# Patient Record
Sex: Male | Born: 1955 | Race: Black or African American | Hispanic: No | Marital: Single | Smoking: Former smoker
Health system: Southern US, Community
[De-identification: ages and names within clinical notes are randomized; demographics above are authoritative.]

## PROBLEM LIST (undated history)

## (undated) DIAGNOSIS — Z972 Presence of dental prosthetic device (complete) (partial): Secondary | ICD-10-CM

## (undated) DIAGNOSIS — R569 Unspecified convulsions: Secondary | ICD-10-CM

## (undated) DIAGNOSIS — Z862 Personal history of diseases of the blood and blood-forming organs and certain disorders involving the immune mechanism: Secondary | ICD-10-CM

## (undated) DIAGNOSIS — I10 Essential (primary) hypertension: Secondary | ICD-10-CM

## (undated) DIAGNOSIS — Z87898 Personal history of other specified conditions: Secondary | ICD-10-CM

## (undated) DIAGNOSIS — C159 Malignant neoplasm of esophagus, unspecified: Secondary | ICD-10-CM

## (undated) DIAGNOSIS — L404 Guttate psoriasis: Secondary | ICD-10-CM

## (undated) DIAGNOSIS — E785 Hyperlipidemia, unspecified: Secondary | ICD-10-CM

## (undated) DIAGNOSIS — F419 Anxiety disorder, unspecified: Secondary | ICD-10-CM

## (undated) DIAGNOSIS — K08109 Complete loss of teeth, unspecified cause, unspecified class: Secondary | ICD-10-CM

## (undated) DIAGNOSIS — F329 Major depressive disorder, single episode, unspecified: Secondary | ICD-10-CM

## (undated) DIAGNOSIS — R683 Clubbing of fingers: Secondary | ICD-10-CM

## (undated) DIAGNOSIS — F32A Depression, unspecified: Secondary | ICD-10-CM

## (undated) DIAGNOSIS — M1990 Unspecified osteoarthritis, unspecified site: Secondary | ICD-10-CM

## (undated) DIAGNOSIS — F1011 Alcohol abuse, in remission: Secondary | ICD-10-CM

## (undated) DIAGNOSIS — Z8679 Personal history of other diseases of the circulatory system: Secondary | ICD-10-CM

## (undated) DIAGNOSIS — K7030 Alcoholic cirrhosis of liver without ascites: Secondary | ICD-10-CM

## (undated) DIAGNOSIS — C61 Malignant neoplasm of prostate: Secondary | ICD-10-CM

## (undated) HISTORY — DX: Essential (primary) hypertension: I10

## (undated) HISTORY — PX: TRANSURETHRAL RESECTION OF PROSTATE: SHX73

## (undated) HISTORY — DX: Malignant neoplasm of esophagus, unspecified: C15.9

## (undated) HISTORY — DX: Anxiety disorder, unspecified: F41.9

## (undated) HISTORY — PX: NO PAST SURGERIES: SHX2092

## (undated) HISTORY — DX: Depression, unspecified: F32.A

## (undated) HISTORY — DX: Major depressive disorder, single episode, unspecified: F32.9

## (undated) SURGERY — COLONOSCOPY
Anesthesia: Moderate Sedation

## (undated) SURGERY — EGD (ESOPHAGOGASTRODUODENOSCOPY)
Anesthesia: Moderate Sedation

---

## 2002-02-08 ENCOUNTER — Encounter: Payer: Self-pay | Admitting: Family Medicine

## 2002-02-08 ENCOUNTER — Ambulatory Visit (HOSPITAL_COMMUNITY): Admission: RE | Admit: 2002-02-08 | Discharge: 2002-02-08 | Payer: Self-pay | Admitting: Family Medicine

## 2002-12-13 ENCOUNTER — Encounter: Payer: Self-pay | Admitting: Rheumatology

## 2002-12-13 ENCOUNTER — Encounter (HOSPITAL_COMMUNITY): Admission: RE | Admit: 2002-12-13 | Discharge: 2003-01-12 | Payer: Self-pay | Admitting: Rheumatology

## 2003-08-06 ENCOUNTER — Ambulatory Visit (HOSPITAL_COMMUNITY): Admission: RE | Admit: 2003-08-06 | Discharge: 2003-08-06 | Payer: Self-pay | Admitting: Family Medicine

## 2004-06-18 ENCOUNTER — Ambulatory Visit: Payer: Self-pay | Admitting: Family Medicine

## 2004-08-17 ENCOUNTER — Ambulatory Visit: Payer: Self-pay | Admitting: Family Medicine

## 2004-12-03 ENCOUNTER — Encounter: Payer: Self-pay | Admitting: Family Medicine

## 2004-12-17 ENCOUNTER — Ambulatory Visit: Payer: Self-pay | Admitting: Family Medicine

## 2005-01-26 ENCOUNTER — Ambulatory Visit: Payer: Self-pay | Admitting: Family Medicine

## 2005-04-07 ENCOUNTER — Ambulatory Visit: Payer: Self-pay | Admitting: Family Medicine

## 2006-06-13 ENCOUNTER — Ambulatory Visit: Payer: Self-pay | Admitting: Family Medicine

## 2006-06-13 ENCOUNTER — Ambulatory Visit (HOSPITAL_COMMUNITY): Admission: RE | Admit: 2006-06-13 | Discharge: 2006-06-13 | Payer: Self-pay | Admitting: Family Medicine

## 2006-07-14 ENCOUNTER — Ambulatory Visit: Payer: Self-pay | Admitting: Family Medicine

## 2007-06-19 ENCOUNTER — Ambulatory Visit: Payer: Self-pay | Admitting: Family Medicine

## 2007-07-04 ENCOUNTER — Ambulatory Visit (HOSPITAL_COMMUNITY): Admission: RE | Admit: 2007-07-04 | Discharge: 2007-07-04 | Payer: Self-pay | Admitting: Orthopaedic Surgery

## 2007-12-22 ENCOUNTER — Ambulatory Visit: Payer: Self-pay | Admitting: Family Medicine

## 2007-12-22 DIAGNOSIS — F411 Generalized anxiety disorder: Secondary | ICD-10-CM | POA: Insufficient documentation

## 2007-12-22 DIAGNOSIS — I10 Essential (primary) hypertension: Secondary | ICD-10-CM

## 2007-12-22 DIAGNOSIS — F17200 Nicotine dependence, unspecified, uncomplicated: Secondary | ICD-10-CM

## 2007-12-22 DIAGNOSIS — F329 Major depressive disorder, single episode, unspecified: Secondary | ICD-10-CM

## 2007-12-22 LAB — CONVERTED CEMR LAB
BUN: 10 mg/dL (ref 6–23)
Chloride: 106 meq/L (ref 96–112)
Creatinine, Ser: 0.91 mg/dL (ref 0.40–1.50)
Glucose, Bld: 85 mg/dL (ref 70–99)
Hemoglobin: 13 g/dL (ref 13.0–17.0)
LDL Cholesterol: 31 mg/dL (ref 0–99)
Lymphs Abs: 2.7 10*3/uL (ref 0.7–4.0)
MCV: 96.7 fL (ref 78.0–100.0)
Monocytes Absolute: 0.8 10*3/uL (ref 0.1–1.0)
Monocytes Relative: 12 % (ref 3–12)
Neutro Abs: 3.2 10*3/uL (ref 1.7–7.7)
Neutrophils Relative %: 48 % (ref 43–77)
PSA: 2.66 ng/mL (ref 0.10–4.00)
Potassium: 4.2 meq/L (ref 3.5–5.3)
RBC: 3.98 M/uL — ABNORMAL LOW (ref 4.22–5.81)
TSH: 0.458 microintl units/mL (ref 0.350–4.50)
VLDL: 61 mg/dL — ABNORMAL HIGH (ref 0–40)
WBC: 6.8 10*3/uL (ref 4.0–10.5)

## 2008-01-26 ENCOUNTER — Ambulatory Visit: Payer: Self-pay | Admitting: Family Medicine

## 2008-01-26 DIAGNOSIS — F1021 Alcohol dependence, in remission: Secondary | ICD-10-CM | POA: Insufficient documentation

## 2008-06-06 ENCOUNTER — Telehealth: Payer: Self-pay | Admitting: Family Medicine

## 2009-03-10 ENCOUNTER — Ambulatory Visit: Payer: Self-pay | Admitting: Family Medicine

## 2009-03-12 ENCOUNTER — Encounter: Payer: Self-pay | Admitting: Family Medicine

## 2009-03-12 LAB — CONVERTED CEMR LAB
Basophils Absolute: 0 10*3/uL (ref 0.0–0.1)
Basophils Relative: 0 % (ref 0–1)
Calcium: 9.6 mg/dL (ref 8.4–10.5)
Cholesterol: 205 mg/dL — ABNORMAL HIGH (ref 0–200)
Glucose, Bld: 91 mg/dL (ref 70–99)
MCHC: 33.9 g/dL (ref 30.0–36.0)
Monocytes Absolute: 1.2 10*3/uL — ABNORMAL HIGH (ref 0.1–1.0)
Neutro Abs: 5.3 10*3/uL (ref 1.7–7.7)
Neutrophils Relative %: 59 % (ref 43–77)
PSA: 2.53 ng/mL (ref 0.10–4.00)
Platelets: 125 10*3/uL — ABNORMAL LOW (ref 150–400)
RDW: 13.6 % (ref 11.5–15.5)
Sodium: 139 meq/L (ref 135–145)
Total CHOL/HDL Ratio: 2.2

## 2009-05-06 ENCOUNTER — Encounter (INDEPENDENT_AMBULATORY_CARE_PROVIDER_SITE_OTHER): Payer: Self-pay | Admitting: *Deleted

## 2010-03-27 ENCOUNTER — Encounter: Payer: Self-pay | Admitting: Family Medicine

## 2010-04-09 ENCOUNTER — Encounter: Payer: Self-pay | Admitting: Family Medicine

## 2010-06-23 NOTE — Letter (Signed)
Summary: NO SHOW  NO SHOW   Imported By: Lind Guest 03/27/2010 11:27:17  _____________________________________________________________________  External Attachment:    Type:   Image     Comment:   External Document

## 2010-06-23 NOTE — Letter (Signed)
Summary: Letter that was mailed back  Letter that was mailed back   Imported By: Lind Guest 04/09/2010 14:50:15  _____________________________________________________________________  External Attachment:    Type:   Image     Comment:   External Document

## 2010-09-01 ENCOUNTER — Telehealth: Payer: Self-pay | Admitting: Family Medicine

## 2010-09-02 ENCOUNTER — Emergency Department (HOSPITAL_COMMUNITY)
Admission: EM | Admit: 2010-09-02 | Discharge: 2010-09-02 | Disposition: A | Discharge status: Home / Self Care | Payer: Medicaid Other | Attending: Emergency Medicine | Admitting: Emergency Medicine

## 2010-09-02 DIAGNOSIS — L408 Other psoriasis: Secondary | ICD-10-CM | POA: Insufficient documentation

## 2010-09-02 DIAGNOSIS — I10 Essential (primary) hypertension: Secondary | ICD-10-CM | POA: Insufficient documentation

## 2010-09-14 ENCOUNTER — Encounter: Payer: Self-pay | Admitting: Family Medicine

## 2010-09-15 ENCOUNTER — Encounter: Payer: Self-pay | Admitting: Family Medicine

## 2010-09-16 ENCOUNTER — Encounter: Payer: Self-pay | Admitting: Family Medicine

## 2010-09-16 ENCOUNTER — Ambulatory Visit (INDEPENDENT_AMBULATORY_CARE_PROVIDER_SITE_OTHER): Payer: Medicaid Other | Admitting: Family Medicine

## 2010-09-16 VITALS — HR 118 | Resp 16 | Wt 156.1 lb

## 2010-09-16 DIAGNOSIS — I10 Essential (primary) hypertension: Secondary | ICD-10-CM

## 2010-09-16 DIAGNOSIS — Z125 Encounter for screening for malignant neoplasm of prostate: Secondary | ICD-10-CM

## 2010-09-16 DIAGNOSIS — Z13220 Encounter for screening for lipoid disorders: Secondary | ICD-10-CM

## 2010-09-16 DIAGNOSIS — Z113 Encounter for screening for infections with a predominantly sexual mode of transmission: Secondary | ICD-10-CM

## 2010-09-16 DIAGNOSIS — F1010 Alcohol abuse, uncomplicated: Secondary | ICD-10-CM

## 2010-09-16 DIAGNOSIS — F17200 Nicotine dependence, unspecified, uncomplicated: Secondary | ICD-10-CM

## 2010-09-16 DIAGNOSIS — L259 Unspecified contact dermatitis, unspecified cause: Secondary | ICD-10-CM

## 2010-09-16 DIAGNOSIS — R5383 Other fatigue: Secondary | ICD-10-CM

## 2010-09-16 DIAGNOSIS — L309 Dermatitis, unspecified: Secondary | ICD-10-CM

## 2010-09-16 MED ORDER — PREDNISONE (PAK) 5 MG PO TABS: 5.0000 mg | ORAL_TABLET | ORAL | Status: DC

## 2010-09-16 MED ORDER — DOXYCYCLINE HYCLATE 100 MG PO TABS: 100.0000 mg | ORAL_TABLET | Freq: Two times a day (BID) | ORAL | Status: AC

## 2010-09-16 NOTE — Progress Notes (Signed)
  Subjective:    Patient ID: Stanley Jimenez, male    DOB: 1956/04/21, 55 y.o.   MRN: [id number]  HPI 4 week h/o puritic rash over trunk, no one else has this, no fever or chills, or drainage Pt reports he is smoking less and drinking less, he has not been to the office for over 1 year. He voices no other concerns or complaints. He was recently seen in the ED for the rash, no improvement in symptoms   Review of Systems Denies recent fever or chills. Denies sinus pressure, nasal congestion, ear pain or sore throat. Denies chest congestion, productive cough or wheezing. Denies chest pains, palpitations, paroxysmal nocturnal dyspnea, orthopnea and leg swelling Denies abdominal pain, nausea, vomiting,diarrhea or constipation.  Denies rectal bleeding or change in bowel movement. Denies dysuria, frequency, hesitancy or incontinence. Denies joint pain, swelling and limitation in mobility. Denies headaches, seizure, numbness, or tingling. Denies symptoms of  depression, anxiety or insomnia.In the past he has been on paxil, but has not used this for over 1 year, and states he no longer needs the medication       Objective:   Physical Exam Patient alert and oriented and in no Cardiopulmonary distress.  HEENT: No facial asymmetry, EOMI, no sinus tenderness, TM's clear, Oropharynx pink and moist.  Neck supple no adenopathy.  Chest: Clear to auscultation bilaterally.  CVS: S1, S2 no murmurs, no S3.  ABD: Soft non tender. Bowel sounds normal.  Ext: No edema  MS: Adequate ROM spine, shoulders, hips and knees.  Skin:diffuse hyperpigmented macular rash over trunk and extremities, areas of excoriation, where pt has been scratching himself  Psych: Good eye contact, normal affect. Memory impairment, mildly anxious, not depressed appearing.  CNS: CN 2-12 intact, power, tone and sensation normal throughout.        Assessment & Plan:

## 2010-09-16 NOTE — Patient Instructions (Addendum)
F/u in 8 weeks.  CBC, fasting chem 7, lipid, tSH , RPR  And pSA in the morning.  Medication is sent in for the rash.  You are being referred to a skin specialist

## 2010-09-21 NOTE — Telephone Encounter (Signed)
error 

## 2010-09-22 LAB — BASIC METABOLIC PANEL
CO2: 26 mEq/L (ref 19–32)
Chloride: 102 mEq/L (ref 96–112)
Creat: 0.94 mg/dL (ref 0.40–1.50)

## 2010-09-22 LAB — CBC WITH DIFFERENTIAL/PLATELET
Basophils Relative: 2 % — ABNORMAL HIGH (ref 0–1)
Eosinophils Absolute: 0.1 10*3/uL (ref 0.0–0.7)
Eosinophils Relative: 2 % (ref 0–5)
HCT: 36.3 % — ABNORMAL LOW (ref 39.0–52.0)
Hemoglobin: 12.5 g/dL — ABNORMAL LOW (ref 13.0–17.0)
MCH: 32.7 pg (ref 26.0–34.0)
MCHC: 34.4 g/dL (ref 30.0–36.0)
Monocytes Absolute: 1.1 10*3/uL — ABNORMAL HIGH (ref 0.1–1.0)
Monocytes Relative: 17 % — ABNORMAL HIGH (ref 3–12)

## 2010-09-22 LAB — PSA: PSA: 4.23 ng/mL — ABNORMAL HIGH (ref <=4.00)

## 2010-09-22 LAB — LIPID PANEL
LDL Cholesterol: 100 mg/dL — ABNORMAL HIGH (ref 0–99)
VLDL: 15 mg/dL (ref 0–40)

## 2010-09-22 LAB — RPR

## 2010-09-24 ENCOUNTER — Encounter: Payer: Self-pay | Admitting: Family Medicine

## 2010-09-24 DIAGNOSIS — L309 Dermatitis, unspecified: Secondary | ICD-10-CM | POA: Insufficient documentation

## 2010-09-24 NOTE — Assessment & Plan Note (Signed)
Improved by history 

## 2010-09-24 NOTE — Assessment & Plan Note (Signed)
Improved by history

## 2010-09-24 NOTE — Assessment & Plan Note (Signed)
Severe dermatitis , wit high risk of bacterial superinfection, med prescribed and referral to drmatology

## 2010-09-27 ENCOUNTER — Emergency Department (HOSPITAL_COMMUNITY)
Admission: EM | Admit: 2010-09-27 | Discharge: 2010-09-28 | Disposition: A | Discharge status: Home / Self Care | Payer: Medicaid Other | Attending: Emergency Medicine | Admitting: Emergency Medicine

## 2010-09-27 DIAGNOSIS — F10229 Alcohol dependence with intoxication, unspecified: Secondary | ICD-10-CM | POA: Insufficient documentation

## 2010-09-27 DIAGNOSIS — L408 Other psoriasis: Secondary | ICD-10-CM | POA: Insufficient documentation

## 2010-09-27 LAB — DIFFERENTIAL
Basophils Absolute: 0.1 10*3/uL (ref 0.0–0.1)
Basophils Relative: 1 % (ref 0–1)
Eosinophils Absolute: 0.2 10*3/uL (ref 0.0–0.7)
Eosinophils Relative: 2 % (ref 0–5)
Lymphocytes Relative: 30 % (ref 12–46)
Lymphs Abs: 2.1 10*3/uL (ref 0.7–4.0)
Monocytes Absolute: 1 10*3/uL (ref 0.1–1.0)
Monocytes Relative: 15 % — ABNORMAL HIGH (ref 3–12)
Neutro Abs: 3.6 10*3/uL (ref 1.7–7.7)
Neutrophils Relative %: 52 % (ref 43–77)

## 2010-09-27 LAB — ETHANOL

## 2010-09-27 LAB — URINALYSIS, ROUTINE W REFLEX MICROSCOPIC
Bilirubin Urine: NEGATIVE
Glucose, UA: NEGATIVE mg/dL
Ketones, ur: NEGATIVE mg/dL
Leukocytes, UA: NEGATIVE
Nitrite: NEGATIVE
Protein, ur: 30 mg/dL — AB
Specific Gravity, Urine: 1.025 (ref 1.005–1.030)
Urobilinogen, UA: 0.2 mg/dL (ref 0.0–1.0)
pH: 5.5 (ref 5.0–8.0)

## 2010-09-27 LAB — CBC
HCT: 36.8 % — ABNORMAL LOW (ref 39.0–52.0)
Hemoglobin: 12.7 g/dL — ABNORMAL LOW (ref 13.0–17.0)
MCH: 32.6 pg (ref 26.0–34.0)
MCV: 94.4 fL (ref 78.0–100.0)
RBC: 3.9 MIL/uL — ABNORMAL LOW (ref 4.22–5.81)

## 2010-09-27 LAB — BASIC METABOLIC PANEL WITH GFR
BUN: 9 mg/dL (ref 6–23)
CO2: 26 meq/L (ref 19–32)
Calcium: 9.8 mg/dL (ref 8.4–10.5)
Chloride: 97 meq/L (ref 96–112)
Creatinine, Ser: 0.94 mg/dL (ref 0.4–1.5)
GFR calc non Af Amer: >60 mL/min
Glucose, Bld: 90 mg/dL (ref 70–99)
Potassium: 4.5 meq/L (ref 3.5–5.1)
Sodium: 137 meq/L (ref 135–145)

## 2010-09-27 LAB — RAPID URINE DRUG SCREEN, HOSP PERFORMED
Benzodiazepines: NOT DETECTED
Tetrahydrocannabinol: NOT DETECTED

## 2010-09-27 LAB — URINE MICROSCOPIC-ADD ON

## 2010-09-28 ENCOUNTER — Other Ambulatory Visit: Payer: Self-pay | Admitting: *Deleted

## 2010-09-28 DIAGNOSIS — L309 Dermatitis, unspecified: Secondary | ICD-10-CM

## 2010-09-28 MED ORDER — PREDNISONE (PAK) 5 MG PO TABS: 5.0000 mg | ORAL_TABLET | ORAL | Status: DC

## 2010-10-09 NOTE — Consult Note (Signed)
NAME:  Stanley Jimenez, Stanley Jimenez                       ACCOUNT NO.:  [id number]   MEDICAL RECORD NO.:  [id number]                   PATIENT TYPE:   LOCATION:                                       FACILITY:   PHYSICIAN:  Aundra Dubin, M.D.            DATE OF BIRTH:   DATE OF CONSULTATION:  12/13/2002  DATE OF DISCHARGE:                                   CONSULTATION   CHIEF COMPLAINT:  Hands.   HISTORY OF PRESENT ILLNESS:  Dear Claris Che, thank you for allowing me to  evaluate Stanley Jimenez.  He is a 55 year old black male who has noted for  several years his hands can be stiff.  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  He had a negative echocardiogram concerning this about three years  ago.  He describes pain in his hand with no swelling.  They can be stiff.  He says the hands do not hurt every day but maybe two to three times a week.  The difficulty can last a day.  Sometimes, the fingers fill tight.  This  problem is worse in the winter and it is better at the point in the hot  summer.  He has some stiff neck, but says his wrists, elbows, knees, ankles  and feet do not bother him particularly.   LABORATORY DATA:  Labs from February 08, 2002 show a WBC 7.9, hemoglobin  14.4, platelets 249.  AST 20, ALT 19.  Glucose 113, creatinine 0.9,  cholesterol 198.   REVIEW OF SYSTEMS:  On further review of systems, he denies any significant  rash or weight loss.  He says his energy level is pretty good.  He has had  some fever with a recent sinus infection.  Sleep is nonrestorative and feels  that some of the medicines keep him awake.  He takes most of his medicines  at night.  There has been no psoriasis, ulcers, alopecia, pleurisy or  Raynaud's.  He rarely has headaches.  He has no significant problems with  his bowels and denies gastritis symptoms.  There has been no chest pain or  shortness of breath.   PAST MEDICAL/SURGICAL HISTORY:  1. Alcoholism.  I believe he was hospitalized  for a period of time for     treatment.  2. Hypertension.   MEDICATIONS:  1. Alprazolam 0.5 mg h.s.  2. Lotrel daily.  3. Prozac 20 mg daily.  4. Clarinex p.r.n.   DRUG INTOLERANCES:  None.   FAMILY HISTORY:  Father died at age 24 and mother died at age 67 with  alcohol-related problems.   SOCIAL HISTORY:  He lives here in [place].  He is single and lives alone.  He has consumed no alcohol in four years.  He began smoking at about age 71  and smokes close to a pack a day.   PHYSICAL EXAMINATION:  GENERAL:  He is overall appropriate weight and is in  no distress.  His affect is slightly timid, but answers questions readily.  Weight 181 pounds, 5 feet, 10 inches.  VITAL SIGNS:  Blood pressure 130/80, respirations 16, pulse 64.  SKIN:  Clear.  HEENT:  PERL-EOMI.  Mouth clear.  NECK:  Negative JVD, normal thyroid.  LUNGS:  Quiet sounds but clear.  HEART:  Regular, no murmur.  ABDOMEN:  Negative hepatosplenomegaly, nontender.  MUSCULOSKELETAL:  Hands do show clubbing to the distal fingers throughout.  These areas were nontender.  There was slight contracture of the left  fourth PIP but was nontender.  Basically, the PIP's and MCP's were  nonswollen, nontender.  Wrists, elbows, shoulders and neck had a good range  of motion and had mild stiffness.  Back nontender.  Trigger points around  the neck and shoulder were nontender.  The muscles were tight.  Hips, knees,  ankles and feet have good range of motion.  No active arthritis, except he  did have some bunion formation at the big toes and the left big toe had some  mild tenderness.  NEUROLOGIC:  Nonfocal.   ASSESSMENT/PLAN:  1. Hand pain.  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  Will have the hands x-rayed.  He has the clubbing to the     fingers.  He had a chest x-ray on February 08, 2002 which I have     reviewed, which shows some slight bronchitic-type of markings, but no     masses.  He has  had no weight loss and says his weight has actually gone     up.  At this time, I do not believe we need to repeat the chest x-ray.     This is because there can be a condition of arthritis associated with     hypertrophic osteoarthropathy.  I will try him on ibuprofen 800 mg 1     daily b.i.d. p.r.n.  He is cautioned to decrease his medicine if it is     bothering his stomach in any way.  2. Clubbing.  3. Smoking.  4. Possible depression.  5. Insomnia.  I have asked him to use only the alprazolam at night and to     take the other medicines during the day.  If this is a medicine reaction,     this may help his sleep.   Margaret, Stanley Jimenez's hands do not look overly swollen.  I believe  allowing him to use the ibuprofen is appropriate.  If his signs and symptoms  change, I will be glad to reevaluate.  I will try and have the x-rays pulled  to be sure that there is no periosteal reaction to the bones.  He will  return on a p.r.n. basis.                                                  Aundra Dubin, M.D.    WWT/MEDQ  D:  12/13/2002  T:  12/13/2002  Job:  [id number]   cc:   Milus Mallick. Lodema Hong, M.D.  [address]  [place], [place] [address]  Fax: [phone]

## 2010-11-16 ENCOUNTER — Encounter: Payer: Self-pay | Admitting: Family Medicine

## 2010-11-17 ENCOUNTER — Encounter: Payer: Self-pay | Admitting: Family Medicine

## 2010-11-17 ENCOUNTER — Ambulatory Visit: Payer: Medicaid Other | Admitting: Family Medicine

## 2010-12-28 ENCOUNTER — Encounter: Payer: Self-pay | Admitting: Family Medicine

## 2010-12-29 ENCOUNTER — Encounter: Payer: Self-pay | Admitting: Family Medicine

## 2010-12-29 ENCOUNTER — Ambulatory Visit (INDEPENDENT_AMBULATORY_CARE_PROVIDER_SITE_OTHER): Payer: Medicaid Other | Admitting: Family Medicine

## 2010-12-29 VITALS — BP 180/100 | HR 92 | Resp 16 | Ht 69.5 in | Wt 145.8 lb

## 2010-12-29 DIAGNOSIS — R972 Elevated prostate specific antigen [PSA]: Secondary | ICD-10-CM

## 2010-12-29 DIAGNOSIS — L408 Other psoriasis: Secondary | ICD-10-CM

## 2010-12-29 DIAGNOSIS — F1010 Alcohol abuse, uncomplicated: Secondary | ICD-10-CM

## 2010-12-29 DIAGNOSIS — L404 Guttate psoriasis: Secondary | ICD-10-CM

## 2010-12-29 DIAGNOSIS — F17200 Nicotine dependence, unspecified, uncomplicated: Secondary | ICD-10-CM

## 2010-12-29 HISTORY — DX: Guttate psoriasis: L40.4

## 2010-12-29 MED ORDER — HYDROXYZINE HCL 10 MG PO TABS: 10.0000 mg | ORAL_TABLET | Freq: Two times a day (BID) | ORAL | Status: AC | PRN

## 2010-12-29 MED ORDER — CLOBETASOL PROPIONATE 0.05 % EX CREA: TOPICAL_CREAM | Freq: Two times a day (BID) | CUTANEOUS | Status: DC

## 2010-12-29 NOTE — Patient Instructions (Signed)
F/u  In 2.5 months.  Med is sent to your pharmacy, tablet and a cream  You are referred to urology and dermatology  PLS STOP DRINKING, it can kill you

## 2010-12-30 ENCOUNTER — Encounter: Payer: Self-pay | Admitting: Family Medicine

## 2010-12-30 NOTE — Progress Notes (Signed)
  Subjective:    Patient ID: Stanley Jimenez, male    DOB: 08-27-1955, 55 y.o.   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 He n longer has access to dermatology in [place] and is seeking a new provider. He does state he has been drinking continually for the past 3 years, states he will go  In for detox when "I am ready", explained this was a decision he had to make, and encouraged him strongly to do so. He has had elevated PSA in the past and needs urology to follow this   Review of Systems Denies recent fever or chills. Denies sinus pressure, nasal congestion, ear pain or sore throat. Denies chest congestion, productive cough or wheezing. Denies chest pains, palpitations and leg swelling Denies abdominal pain, nausea, vomiting,diarrhea or constipation.   Denies dysuria, frequency, hesitancy or incontinence. Chronic anxiety and depression, not suicidal or homicidal, alcohol dependent Generalized skin rash with no purulent drainage      Objective:   Physical Exam Patient alert and in no cardiopulmonary distress.  HEENT: No facial asymmetry, EOMI,  oropharynx pink and moist.Poor dentition  Neck supple   Chest: Clear to auscultation bilaterally.Decreased air entry bilaterally  CVS: S1, S2 no murmurs, no S3.  ABD: Soft non tender. Bowel sounds normal.  Ext: No edema  MS: Adequate ROM spine, shoulders, hips and knees.  Skin: hyperpigmented macular rash, no purulent drainage  Psych: Good eye contact, normal affect. Memory intact not anxious or depressed appearing.  CNS: CN 2-12 intact, power, tone and sensation normal throughout.        Assessment & Plan:

## 2010-12-30 NOTE — Assessment & Plan Note (Signed)
referred to urology for further eval

## 2010-12-30 NOTE — Assessment & Plan Note (Signed)
Temovate prescribed, and referral to new provider

## 2010-12-30 NOTE — Assessment & Plan Note (Signed)
Unchanged, counseled to quit 

## 2010-12-30 NOTE — Assessment & Plan Note (Signed)
Persistent alcohol abuse , encouraged strongly to go for detox. Blood pressure elevated since pt is in temporary withdrawal, this has happened in the [place], no med at this visit

## 2011-03-23 ENCOUNTER — Encounter: Payer: Self-pay | Admitting: Family Medicine

## 2011-03-25 ENCOUNTER — Ambulatory Visit: Payer: Medicaid Other | Admitting: Family Medicine

## 2011-04-07 ENCOUNTER — Encounter: Payer: Self-pay | Admitting: Family Medicine

## 2011-04-07 ENCOUNTER — Other Ambulatory Visit: Payer: Self-pay | Admitting: Family Medicine

## 2011-04-08 ENCOUNTER — Encounter: Payer: Self-pay | Admitting: Family Medicine

## 2011-04-08 ENCOUNTER — Ambulatory Visit: Payer: Medicaid Other | Admitting: Family Medicine

## 2011-07-28 ENCOUNTER — Ambulatory Visit: Payer: Medicaid Other | Admitting: Family Medicine

## 2011-08-17 ENCOUNTER — Encounter: Payer: Self-pay | Admitting: Family Medicine

## 2011-08-17 ENCOUNTER — Ambulatory Visit (INDEPENDENT_AMBULATORY_CARE_PROVIDER_SITE_OTHER): Payer: Medicaid Other | Admitting: Family Medicine

## 2011-08-17 VITALS — BP 180/108 | HR 105 | Resp 17 | Ht 69.0 in | Wt 146.8 lb

## 2011-08-17 DIAGNOSIS — R5383 Other fatigue: Secondary | ICD-10-CM

## 2011-08-17 DIAGNOSIS — Z125 Encounter for screening for malignant neoplasm of prostate: Secondary | ICD-10-CM

## 2011-08-17 DIAGNOSIS — L309 Dermatitis, unspecified: Secondary | ICD-10-CM

## 2011-08-17 DIAGNOSIS — F411 Generalized anxiety disorder: Secondary | ICD-10-CM

## 2011-08-17 DIAGNOSIS — F1010 Alcohol abuse, uncomplicated: Secondary | ICD-10-CM

## 2011-08-17 DIAGNOSIS — Z13220 Encounter for screening for lipoid disorders: Secondary | ICD-10-CM

## 2011-08-17 DIAGNOSIS — L259 Unspecified contact dermatitis, unspecified cause: Secondary | ICD-10-CM

## 2011-08-17 DIAGNOSIS — M25511 Pain in right shoulder: Secondary | ICD-10-CM | POA: Insufficient documentation

## 2011-08-17 DIAGNOSIS — F17200 Nicotine dependence, unspecified, uncomplicated: Secondary | ICD-10-CM

## 2011-08-17 DIAGNOSIS — M25519 Pain in unspecified shoulder: Secondary | ICD-10-CM

## 2011-08-17 DIAGNOSIS — I10 Essential (primary) hypertension: Secondary | ICD-10-CM

## 2011-08-17 DIAGNOSIS — R5381 Other malaise: Secondary | ICD-10-CM

## 2011-08-17 MED ORDER — AMLODIPINE BESYLATE 5 MG PO TABS: 5.0000 mg | ORAL_TABLET | Freq: Every day | ORAL | Status: DC

## 2011-08-17 MED ORDER — IBUPROFEN 800 MG PO TABS: 800.0000 mg | ORAL_TABLET | Freq: Three times a day (TID) | ORAL | Status: AC | PRN

## 2011-08-17 MED ORDER — PREDNISONE (PAK) 5 MG PO TABS: 5.0000 mg | ORAL_TABLET | ORAL | Status: DC

## 2011-08-17 NOTE — Patient Instructions (Addendum)
F/U in early May.  Fasting cbc , fasting lipid , hepatic, chem 7, tsh and PSA April 30 or after  Medication is sent in for right shoulder pain  Please try top quit smoking and drinking alcohol  Blood pressure is high, you need to start the medication that has been sent in today

## 2011-08-17 NOTE — Progress Notes (Signed)
  Subjective:    Patient ID: Stanley Jimenez, male    DOB: Sep 04, 1955, 56 y.o.   MRN: [id number]  HPI The PT is here for follow up and re-evaluation of chronic medical conditions, medication management and review of any available recent lab and radiology data.  Preventive health is updated, specifically  Cancer screening and Immunization.   Questions or concerns regarding consultations or procedures which the PT has had in the interim are  addressed. The PT denies any adverse reactions to current medications since the last visit.     Review of Systems See HPI Denies recent fever or chills. Denies sinus pressure, nasal congestion, ear pain or sore throat. Denies chest congestion, productive cough or wheezing. Denies chest pains, palpitations and leg swelling Denies abdominal pain, nausea, vomiting,diarrhea or constipation.   Denies dysuria, frequency, hesitancy or incontinence. Denies headaches, seizures, numbness, or tingling. Denies depression,or uncontrolled  anxiety or insomnia.      Objective:   Physical Exam Patient alert and oriented and in no cardiopulmonary distress.  HEENT: No facial asymmetry, EOMI, no sinus tenderness,  oropharynx pink and moist.  Neck supple no adenopathy.  Chest: Clear to auscultation bilaterally.Decreased throughout  CVS: S1, S2 no murmurs, no S3.  ABD: Soft non tender. Bowel sounds normal.  Ext: No edema  MS: Adequate ROM spine, shoulders, hips and knees.   Psych: Good eye contact, normal affect. Memory loss, anxious not depressed appearing.  CNS: CN 2-12 intact, power, tone and sensation normal throughout.       Assessment & Plan:

## 2011-08-21 NOTE — Assessment & Plan Note (Signed)
improved

## 2011-08-21 NOTE — Assessment & Plan Note (Addendum)
Uncontrolled , pt has been drinking alcohol in excess , will start medication

## 2011-08-21 NOTE — Assessment & Plan Note (Addendum)
Unchanged, counseled to quit 

## 2011-08-21 NOTE — Assessment & Plan Note (Signed)
Unchanged, counseled to quit, unwilling to do so

## 2011-08-21 NOTE — Assessment & Plan Note (Signed)
Increased medication sent to the pharmacy

## 2011-09-28 ENCOUNTER — Ambulatory Visit: Payer: Medicaid Other | Admitting: Family Medicine

## 2011-10-12 ENCOUNTER — Ambulatory Visit (INDEPENDENT_AMBULATORY_CARE_PROVIDER_SITE_OTHER): Payer: Medicaid Other | Admitting: Family Medicine

## 2011-10-12 ENCOUNTER — Encounter: Payer: Self-pay | Admitting: Family Medicine

## 2011-10-12 VITALS — BP 180/90 | HR 92 | Resp 18 | Ht 69.0 in | Wt 145.1 lb

## 2011-10-12 DIAGNOSIS — L404 Guttate psoriasis: Secondary | ICD-10-CM

## 2011-10-12 DIAGNOSIS — L408 Other psoriasis: Secondary | ICD-10-CM

## 2011-10-12 DIAGNOSIS — F17200 Nicotine dependence, unspecified, uncomplicated: Secondary | ICD-10-CM

## 2011-10-12 DIAGNOSIS — F1010 Alcohol abuse, uncomplicated: Secondary | ICD-10-CM

## 2011-10-12 DIAGNOSIS — I10 Essential (primary) hypertension: Secondary | ICD-10-CM

## 2011-10-12 LAB — CBC WITH DIFFERENTIAL/PLATELET
Basophils Absolute: 0.1 10*3/uL (ref 0.0–0.1)
Eosinophils Relative: 1 % (ref 0–5)
HCT: 34.5 % — ABNORMAL LOW (ref 39.0–52.0)
Lymphocytes Relative: 39 % (ref 12–46)
MCHC: 35.4 g/dL (ref 30.0–36.0)
MCV: 93.8 fL (ref 78.0–100.0)
Monocytes Absolute: 0.8 10*3/uL (ref 0.1–1.0)
RDW: 13.8 % (ref 11.5–15.5)
WBC: 5.8 10*3/uL (ref 4.0–10.5)

## 2011-10-12 MED ORDER — AMLODIPINE BESYLATE 10 MG PO TABS: 10.0000 mg | ORAL_TABLET | Freq: Every day | ORAL | Status: DC

## 2011-10-12 NOTE — Patient Instructions (Signed)
Come back in 2 weeks to follow-up your blood pressure and to go over your bloodwork Take the blood pressure medication Please call the numbers given about the alcohol You need to quit drinking and quit smoking  F/u with Dr.Reform or Lodema Hong

## 2011-10-13 LAB — HEPATIC FUNCTION PANEL
Albumin: 4.2 g/dL (ref 3.5–5.2)
Alkaline Phosphatase: 116 U/L (ref 39–117)
Total Bilirubin: 0.4 mg/dL (ref 0.3–1.2)

## 2011-10-13 LAB — LIPID PANEL
HDL: 129 mg/dL (ref >39)
LDL Cholesterol: 83 mg/dL (ref 0–99)
Total CHOL/HDL Ratio: 1.8 Ratio

## 2011-10-13 LAB — BASIC METABOLIC PANEL
CO2: 22 mEq/L (ref 19–32)
Chloride: 101 mEq/L (ref 96–112)
Sodium: 134 mEq/L — ABNORMAL LOW (ref 135–145)

## 2011-10-14 ENCOUNTER — Encounter: Payer: Self-pay | Admitting: Family Medicine

## 2011-10-14 NOTE — Progress Notes (Signed)
  Subjective:    Patient ID: Stanley Jimenez, male    DOB: 1955-09-10, 56 y.o.   MRN: [id number]  HPI Pt here to f/u blood pressure, he has not been taking his norvasc. He is still using kenalog cream for his psoriasis.He drinks from sun-up to sun-down and asking for something to help him quit. He states he drinks three 40 ounce bottles a day.    Review of Systems  GEN- denies fatigue, fever, weight loss,weakness, recent illness HEENT- denies eye drainage, change in vision, nasal discharge, CVS- denies chest pain, palpitations RESP- denies SOB, cough, wheeze ABD- denies N/V, change in stools, abd pain GU- denies dysuria, hematuria, dribbling, incontinence MSK- denies joint pain, muscle aches, injury Neuro- denies headache, dizziness, syncope, seizure activity       Objective:   Physical Exam GEN- NAD, alert and oriented x3,smells of alcohol HEENT- PERRL, EOMI, non injected sclera, pink conjunctiva, MMM, oropharynx clear Neck- Supple, CVS- RRR, no murmur RESP-CTAB ABD-NABS,soft, NT,ND EXT- No edema Pulses- Radial, DP- 2+        Assessment & Plan:

## 2011-10-14 NOTE — Assessment & Plan Note (Signed)
Unchanged, counseled on cessation 

## 2011-10-14 NOTE — Assessment & Plan Note (Addendum)
Restart norvasc at 10mg  daily ,recheck in 2 weeks Bloodwork to be drawn today.

## 2011-10-14 NOTE — Assessment & Plan Note (Signed)
Unchanged, continue creams

## 2011-10-14 NOTE — Assessment & Plan Note (Signed)
He states he wants to quit, I explained he can check himself into ER for detox, he was also given resources today for half-way homes and local alcohol programs. I think he needs to take the initiative to set these up

## 2011-10-26 ENCOUNTER — Ambulatory Visit: Payer: Medicaid Other | Admitting: Family Medicine

## 2011-11-02 ENCOUNTER — Ambulatory Visit: Payer: Medicaid Other | Admitting: Family Medicine

## 2011-11-09 ENCOUNTER — Ambulatory Visit (INDEPENDENT_AMBULATORY_CARE_PROVIDER_SITE_OTHER): Payer: Medicaid Other | Admitting: Family Medicine

## 2011-11-09 ENCOUNTER — Encounter: Payer: Self-pay | Admitting: Family Medicine

## 2011-11-09 VITALS — BP 126/78 | HR 96 | Resp 18 | Ht 69.0 in | Wt 143.1 lb

## 2011-11-09 DIAGNOSIS — F1010 Alcohol abuse, uncomplicated: Secondary | ICD-10-CM

## 2011-11-09 DIAGNOSIS — E785 Hyperlipidemia, unspecified: Secondary | ICD-10-CM

## 2011-11-09 DIAGNOSIS — R972 Elevated prostate specific antigen [PSA]: Secondary | ICD-10-CM

## 2011-11-09 DIAGNOSIS — I10 Essential (primary) hypertension: Secondary | ICD-10-CM

## 2011-11-09 NOTE — Patient Instructions (Addendum)
Continue your blood pressure medication I will set you up to see the prostate doctor You need to STOP Drinking F/U 3 months with Dr.Simpson

## 2011-11-09 NOTE — Assessment & Plan Note (Signed)
Improved on medication °

## 2011-11-09 NOTE — Assessment & Plan Note (Signed)
Refer to urology, voiced importance of follow-up, pt willing to go

## 2011-11-09 NOTE — Assessment & Plan Note (Signed)
Elevated TC otherwise normal, no therapy needed, would be reluctant to use statins with his heavy alcohol use

## 2011-11-09 NOTE — Progress Notes (Signed)
  Subjective:    Patient ID: Stanley Jimenez, male    DOB: 25-Jun-1955, 56 y.o.   MRN: [id number]  HPI Patient here to followup on blood pressure from 3 weeks ago. He also had labs done. He is taking his Norvasc as prescribed. He continues to drink heavily most days. He states his knees to quit but is not ready. He did not call any of the detox numbers given to him last time. He states his family is always getting on him about his alcohol.   Review of Systems  GEN- denies fatigue, fever, weight loss,weakness, recent illness HEENT- denies eye drainage, change in vision, nasal discharge, CVS- denies chest pain, palpitations RESP- denies SOB, cough, wheeze ABD- denies N/V, change in stools, abd pain GU- denies dysuria, hematuria, dribbling, incontinence MSK- denies joint pain, muscle aches, injury Neuro- denies headache, dizziness, syncope, seizure activity      Objective:   Physical Exam GEN- NAD, alert and oriented x3, smell of alcohol HEENT- PERRL, EOMI, MMM, oropharynx clear, non icteric CVS- RRR, no murmur RESP-CTAB EXT- No edema Pulses- Radial, DP- 2+ Skin- multiple generalized psoriatic lesions       Assessment & Plan:

## 2011-11-09 NOTE — Assessment & Plan Note (Addendum)
Unchanged, he did not call any numbers given for detox, today he states he is not ready to change, though he knows he needs to

## 2011-11-11 ENCOUNTER — Telehealth: Payer: Self-pay | Admitting: Family Medicine

## 2011-12-15 NOTE — Telephone Encounter (Signed)
Patient is aware 

## 2011-12-17 ENCOUNTER — Ambulatory Visit: Payer: Medicaid Other | Admitting: Urology

## 2012-02-08 ENCOUNTER — Ambulatory Visit: Payer: Medicaid Other | Admitting: Family Medicine

## 2012-03-07 ENCOUNTER — Encounter: Payer: Self-pay | Admitting: Family Medicine

## 2012-03-07 ENCOUNTER — Ambulatory Visit (INDEPENDENT_AMBULATORY_CARE_PROVIDER_SITE_OTHER): Payer: Medicaid Other | Admitting: Family Medicine

## 2012-03-07 ENCOUNTER — Ambulatory Visit: Payer: Medicaid Other | Admitting: Urology

## 2012-03-08 ENCOUNTER — Emergency Department (HOSPITAL_COMMUNITY): Payer: 59

## 2012-03-08 ENCOUNTER — Inpatient Hospital Stay (HOSPITAL_COMMUNITY)
Admission: EM | Admit: 2012-03-08 | Discharge: 2012-03-09 | DRG: 897 | Disposition: A | Discharge status: Home / Self Care | Payer: 59 | Attending: Internal Medicine | Admitting: Internal Medicine

## 2012-03-08 ENCOUNTER — Encounter (HOSPITAL_COMMUNITY): Payer: Self-pay | Admitting: *Deleted

## 2012-03-08 DIAGNOSIS — I472 Ventricular tachycardia, unspecified: Secondary | ICD-10-CM | POA: Diagnosis present

## 2012-03-08 DIAGNOSIS — F1010 Alcohol abuse, uncomplicated: Secondary | ICD-10-CM

## 2012-03-08 DIAGNOSIS — L404 Guttate psoriasis: Secondary | ICD-10-CM | POA: Diagnosis present

## 2012-03-08 DIAGNOSIS — Z9181 History of falling: Secondary | ICD-10-CM

## 2012-03-08 DIAGNOSIS — D6959 Other secondary thrombocytopenia: Secondary | ICD-10-CM | POA: Diagnosis present

## 2012-03-08 DIAGNOSIS — R972 Elevated prostate specific antigen [PSA]: Secondary | ICD-10-CM

## 2012-03-08 DIAGNOSIS — K7010 Alcoholic hepatitis without ascites: Secondary | ICD-10-CM | POA: Diagnosis present

## 2012-03-08 DIAGNOSIS — F411 Generalized anxiety disorder: Secondary | ICD-10-CM | POA: Diagnosis present

## 2012-03-08 DIAGNOSIS — F329 Major depressive disorder, single episode, unspecified: Secondary | ICD-10-CM

## 2012-03-08 DIAGNOSIS — F1290 Cannabis use, unspecified, uncomplicated: Secondary | ICD-10-CM | POA: Diagnosis present

## 2012-03-08 DIAGNOSIS — E785 Hyperlipidemia, unspecified: Secondary | ICD-10-CM

## 2012-03-08 DIAGNOSIS — E876 Hypokalemia: Secondary | ICD-10-CM | POA: Diagnosis present

## 2012-03-08 DIAGNOSIS — I4729 Other ventricular tachycardia: Secondary | ICD-10-CM | POA: Diagnosis present

## 2012-03-08 DIAGNOSIS — L309 Dermatitis, unspecified: Secondary | ICD-10-CM

## 2012-03-08 DIAGNOSIS — L408 Other psoriasis: Secondary | ICD-10-CM | POA: Diagnosis present

## 2012-03-08 DIAGNOSIS — R569 Unspecified convulsions: Secondary | ICD-10-CM | POA: Diagnosis present

## 2012-03-08 DIAGNOSIS — K7030 Alcoholic cirrhosis of liver without ascites: Secondary | ICD-10-CM | POA: Diagnosis present

## 2012-03-08 DIAGNOSIS — E871 Hypo-osmolality and hyponatremia: Secondary | ICD-10-CM

## 2012-03-08 DIAGNOSIS — E860 Dehydration: Secondary | ICD-10-CM

## 2012-03-08 DIAGNOSIS — I10 Essential (primary) hypertension: Secondary | ICD-10-CM

## 2012-03-08 DIAGNOSIS — F17200 Nicotine dependence, unspecified, uncomplicated: Secondary | ICD-10-CM | POA: Diagnosis present

## 2012-03-08 DIAGNOSIS — Z23 Encounter for immunization: Secondary | ICD-10-CM

## 2012-03-08 DIAGNOSIS — F1021 Alcohol dependence, in remission: Secondary | ICD-10-CM | POA: Diagnosis present

## 2012-03-08 DIAGNOSIS — F3289 Other specified depressive episodes: Secondary | ICD-10-CM | POA: Diagnosis present

## 2012-03-08 DIAGNOSIS — F1020 Alcohol dependence, uncomplicated: Secondary | ICD-10-CM | POA: Diagnosis present

## 2012-03-08 DIAGNOSIS — M25511 Pain in right shoulder: Secondary | ICD-10-CM

## 2012-03-08 DIAGNOSIS — D696 Thrombocytopenia, unspecified: Secondary | ICD-10-CM | POA: Diagnosis present

## 2012-03-08 DIAGNOSIS — L259 Unspecified contact dermatitis, unspecified cause: Secondary | ICD-10-CM | POA: Diagnosis present

## 2012-03-08 DIAGNOSIS — F10939 Alcohol use, unspecified with withdrawal, unspecified: Principal | ICD-10-CM | POA: Diagnosis present

## 2012-03-08 DIAGNOSIS — F10239 Alcohol dependence with withdrawal, unspecified: Principal | ICD-10-CM | POA: Diagnosis present

## 2012-03-08 HISTORY — DX: Guttate psoriasis: L40.4

## 2012-03-08 LAB — CBC WITH DIFFERENTIAL/PLATELET
Eosinophils Relative: 0 % (ref 0–5)
HCT: 38.9 % — ABNORMAL LOW (ref 39.0–52.0)
Hemoglobin: 14 g/dL (ref 13.0–17.0)
Lymphocytes Relative: 14 % (ref 12–46)
Lymphs Abs: 1.2 10*3/uL (ref 0.7–4.0)
MCV: 92.6 fL (ref 78.0–100.0)
Monocytes Absolute: 0.9 10*3/uL (ref 0.1–1.0)
RBC: 4.2 MIL/uL — ABNORMAL LOW (ref 4.22–5.81)
WBC: 8.5 10*3/uL (ref 4.0–10.5)

## 2012-03-08 LAB — VITAMIN B12: Vitamin B-12: 809 pg/mL (ref 211–911)

## 2012-03-08 LAB — RAPID URINE DRUG SCREEN, HOSP PERFORMED
Amphetamines: NOT DETECTED
Benzodiazepines: NOT DETECTED
Opiates: NOT DETECTED
Tetrahydrocannabinol: POSITIVE — AB

## 2012-03-08 LAB — MRSA PCR SCREENING: MRSA by PCR: NEGATIVE

## 2012-03-08 LAB — URINALYSIS, ROUTINE W REFLEX MICROSCOPIC
Bilirubin Urine: NEGATIVE
Glucose, UA: NEGATIVE mg/dL
Glucose, UA: NEGATIVE mg/dL
Hgb urine dipstick: NEGATIVE
Ketones, ur: NEGATIVE mg/dL
Leukocytes, UA: NEGATIVE
Protein, ur: NEGATIVE mg/dL
pH: 7 (ref 5.0–8.0)

## 2012-03-08 LAB — HEPATIC FUNCTION PANEL
Albumin: 4 g/dL (ref 3.5–5.2)
Total Protein: 8.6 g/dL — ABNORMAL HIGH (ref 6.0–8.3)

## 2012-03-08 LAB — BASIC METABOLIC PANEL
CO2: 28 mEq/L (ref 19–32)
Calcium: 10.1 mg/dL (ref 8.4–10.5)
Creatinine, Ser: 0.93 mg/dL (ref 0.50–1.35)
Glucose, Bld: 98 mg/dL (ref 70–99)

## 2012-03-08 LAB — PROTIME-INR
INR: 0.97 (ref 0.00–1.49)
Prothrombin Time: 12.8 seconds (ref 11.6–15.2)

## 2012-03-08 LAB — URINE MICROSCOPIC-ADD ON

## 2012-03-08 LAB — ETHANOL: Alcohol, Ethyl (B): <11 mg/dL (ref 0–11)

## 2012-03-08 MED ORDER — INFLUENZA VIRUS VACC SPLIT PF IM SUSP
0.5000 mL | INTRAMUSCULAR | Status: DC
  Filled 2012-03-08: qty 0.5

## 2012-03-08 MED ORDER — SODIUM CHLORIDE 0.9 % IV BOLUS (SEPSIS)
1000.0000 mL | Freq: Once | INTRAVENOUS | Status: AC
  Administered 2012-03-08: 1000 mL via INTRAVENOUS

## 2012-03-08 MED ORDER — NICOTINE 14 MG/24HR TD PT24
14.0000 mg | MEDICATED_PATCH | Freq: Every day | TRANSDERMAL | Status: DC
  Administered 2012-03-08 – 2012-03-09 (×2): 14 mg via TRANSDERMAL
  Filled 2012-03-08 (×2): qty 1

## 2012-03-08 MED ORDER — THIAMINE HCL 100 MG/ML IJ SOLN: 100.0000 mg | Freq: Every day | INTRAMUSCULAR | Status: DC

## 2012-03-08 MED ORDER — LORAZEPAM 1 MG PO TABS
1.0000 mg | ORAL_TABLET | Freq: Once | ORAL | Status: AC
  Administered 2012-03-08: 1 mg via ORAL
  Filled 2012-03-08: qty 1

## 2012-03-08 MED ORDER — CLONIDINE HCL 0.2 MG/24HR TD PTWK
0.2000 mg | MEDICATED_PATCH | TRANSDERMAL | Status: DC
  Administered 2012-03-08: 0.2 mg via TRANSDERMAL
  Filled 2012-03-08: qty 1

## 2012-03-08 MED ORDER — LORAZEPAM 2 MG/ML IJ SOLN
2.0000 mg | Freq: Once | INTRAMUSCULAR | Status: AC
  Administered 2012-03-08: 2 mg via INTRAVENOUS
  Filled 2012-03-08: qty 1

## 2012-03-08 MED ORDER — ADULT MULTIVITAMIN W/MINERALS CH
1.0000 | ORAL_TABLET | Freq: Every day | ORAL | Status: DC
  Administered 2012-03-08 – 2012-03-09 (×2): 1 via ORAL
  Filled 2012-03-08 (×2): qty 1

## 2012-03-08 MED ORDER — ONDANSETRON HCL 4 MG PO TABS: 4.0000 mg | ORAL_TABLET | Freq: Four times a day (QID) | ORAL | Status: DC | PRN

## 2012-03-08 MED ORDER — ALUM & MAG HYDROXIDE-SIMETH 200-200-20 MG/5ML PO SUSP: 30.0000 mL | Freq: Four times a day (QID) | ORAL | Status: DC | PRN

## 2012-03-08 MED ORDER — LEVALBUTEROL HCL 0.63 MG/3ML IN NEBU: 0.6300 mg | INHALATION_SOLUTION | RESPIRATORY_TRACT | Status: DC | PRN

## 2012-03-08 MED ORDER — LORAZEPAM 2 MG/ML IJ SOLN: 1.0000 mg | Freq: Once | INTRAMUSCULAR | Status: DC

## 2012-03-08 MED ORDER — MORPHINE SULFATE 4 MG/ML IJ SOLN: 4.0000 mg | INTRAMUSCULAR | Status: DC | PRN

## 2012-03-08 MED ORDER — PANTOPRAZOLE SODIUM 40 MG PO TBEC
40.0000 mg | DELAYED_RELEASE_TABLET | Freq: Every day | ORAL | Status: DC
  Administered 2012-03-08 – 2012-03-09 (×2): 40 mg via ORAL
  Filled 2012-03-08 (×2): qty 1

## 2012-03-08 MED ORDER — SODIUM CHLORIDE 0.9 % IJ SOLN
INTRAMUSCULAR | Status: AC
  Filled 2012-03-08: qty 6

## 2012-03-08 MED ORDER — FOLIC ACID 1 MG PO TABS
1.0000 mg | ORAL_TABLET | Freq: Every day | ORAL | Status: DC
  Administered 2012-03-08 – 2012-03-09 (×2): 1 mg via ORAL
  Filled 2012-03-08 (×2): qty 1

## 2012-03-08 MED ORDER — DOCUSATE SODIUM 100 MG PO CAPS
100.0000 mg | ORAL_CAPSULE | Freq: Two times a day (BID) | ORAL | Status: DC
  Administered 2012-03-08 – 2012-03-09 (×3): 100 mg via ORAL
  Filled 2012-03-08 (×3): qty 1

## 2012-03-08 MED ORDER — VITAMIN B-1 100 MG PO TABS
100.0000 mg | ORAL_TABLET | Freq: Every day | ORAL | Status: DC
  Administered 2012-03-08 – 2012-03-09 (×2): 100 mg via ORAL
  Filled 2012-03-08 (×2): qty 1

## 2012-03-08 MED ORDER — POTASSIUM CHLORIDE CRYS ER 20 MEQ PO TBCR
20.0000 meq | EXTENDED_RELEASE_TABLET | Freq: Two times a day (BID) | ORAL | Status: DC
  Administered 2012-03-08 – 2012-03-09 (×2): 20 meq via ORAL
  Filled 2012-03-08 (×2): qty 1

## 2012-03-08 MED ORDER — ONDANSETRON HCL 4 MG/2ML IJ SOLN: 4.0000 mg | Freq: Four times a day (QID) | INTRAMUSCULAR | Status: DC | PRN

## 2012-03-08 MED ORDER — ACETAMINOPHEN 325 MG PO TABS: 650.0000 mg | ORAL_TABLET | Freq: Four times a day (QID) | ORAL | Status: DC | PRN

## 2012-03-08 MED ORDER — LORAZEPAM 0.5 MG PO TABS: 1.0000 mg | ORAL_TABLET | Freq: Four times a day (QID) | ORAL | Status: DC | PRN

## 2012-03-08 MED ORDER — POTASSIUM CHLORIDE IN NACL 20-0.9 MEQ/L-% IV SOLN
INTRAVENOUS | Status: DC
  Administered 2012-03-08 – 2012-03-09 (×2): via INTRAVENOUS

## 2012-03-08 MED ORDER — ACETAMINOPHEN 650 MG RE SUPP: 650.0000 mg | Freq: Four times a day (QID) | RECTAL | Status: DC | PRN

## 2012-03-08 MED ORDER — TRIAMCINOLONE ACETONIDE 0.1 % EX CREA
1.0000 "application " | TOPICAL_CREAM | Freq: Two times a day (BID) | CUTANEOUS | Status: DC
  Administered 2012-03-08 – 2012-03-09 (×3): 1 via TOPICAL
  Filled 2012-03-08: qty 15

## 2012-03-08 MED ORDER — GABAPENTIN 100 MG PO CAPS
100.0000 mg | ORAL_CAPSULE | Freq: Two times a day (BID) | ORAL | Status: DC
  Administered 2012-03-08 – 2012-03-09 (×3): 100 mg via ORAL
  Filled 2012-03-08 (×5): qty 1

## 2012-03-08 MED ORDER — SODIUM CHLORIDE 0.9 % IV SOLN
Freq: Once | INTRAVENOUS | Status: AC
  Administered 2012-03-08: 13:00:00 via INTRAVENOUS

## 2012-03-08 MED ORDER — LORAZEPAM 2 MG/ML IJ SOLN
1.0000 mg | Freq: Four times a day (QID) | INTRAMUSCULAR | Status: DC | PRN
  Administered 2012-03-09: 1 mg via INTRAVENOUS
  Filled 2012-03-08: qty 1

## 2012-03-08 MED ORDER — AMLODIPINE BESYLATE 5 MG PO TABS
10.0000 mg | ORAL_TABLET | Freq: Every day | ORAL | Status: DC
  Administered 2012-03-08 – 2012-03-09 (×2): 10 mg via ORAL
  Filled 2012-03-08 (×2): qty 2

## 2012-03-08 NOTE — ED Notes (Signed)
UA results at 1207 are incorrect.  Those results were for another patient.  Contacted lab to remove.

## 2012-03-08 NOTE — ED Notes (Signed)
Pt to CT

## 2012-03-08 NOTE — ED Notes (Signed)
Pt given a urinal for a urine sample

## 2012-03-08 NOTE — H&P (Signed)
[facility] History and Physical  Stanley Jimenez EAV:[id number] DOB: 10-Oct-1955 DOA: 03/08/2012  Referring physician: Dr. Bebe Shaggy PCP: [name], MD  Specialists: Urologist, Dr. Retta Diones  Chief Complaint: Seizure.  HPI: Stanley Jimenez is a 56 y.o. male with a history significant for alcohol abuse and hypertension, who presents today with seizure activity. The history is being provided for the most part by his brother Stanley Jimenez. Mr. Knierim lives with Stanley Jimenez. This morning, at approximately 6:30 AM, Stanley Jimenez was preparing to go to work. The patient was up. He was walking, but then all of a sudden, he fell to the floor and started shaking. He was shaking all over. There was foaming at the mouth. His eyes rolled back in his head. He was incontinent of his bladder and his bowels. The shaking lasted for approximately 5 minutes before it stopped. Following the seizure, the patient was confused and complained of a headache. He does not recall hitting his head. Stanley Jimenez does not know if he hit his head on the floor, but he heard a thump. The patient has no prior history of seizures, but he does have a history of alcohol withdrawal symptoms during a previous hospitalization. He does not recall when he drunk alcohol last, but he believes his last drink was yesterday morning. He generally drinks 2-3 (40 ounce) beers daily.  In the emergency department, he is mildly tachycardic with a heart rate of 102 beats per minute and hypertensive with a blood pressure 181/115. He is oxygenating 100% on nasal cannula oxygen. CT of his head reveals no acute intracranial abnormality. His lab data are significant for a serum sodium of 131, total CK is 258, platelet count of 137, alcohol level of less than 11, and a urine drug screen positive only for THC. He is being admitted for further evaluation and management.    Review of Systems: His review of systems is positive for a chronic scaly rash, sometimes itchy, sometimes  not. He also has pain in his pain in occasionally. As above in history present illness. Otherwise review of systems is negative.  Past Medical History  Diagnosis Date  . Nicotine addiction   . Anxiety   . Depression   . Hypertension   . Alcohol abuse   . 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 Dermatitis 09/24/2010  . Anxiety state, unspecified 12/22/2007    Qualifier: Diagnosis of  By: Doreene Nest LPN, Marijean Niemann    . Elevated PSA 12/29/2010   History reviewed. No pertinent past surgical history. Social History: The patient is single. He lives with his brother. He has no children. He is unemployed. He receives disability benefits for his "hands". He smokes a pack of cigarettes per day. He drinks 2-3 (40 ounce) beers daily. He drinks liquor once or twice a week. He smokes marijuana a few times weekly but not every day.     No Known Allergies  Family History  Problem Relation Age of Onset  . Alcohol abuse Mother   . Alcohol abuse Sister   . Alcohol abuse Brother   . Alcohol abuse Brother   . Hypertension Sister   His mother died of cirrhosis. His father died of injuries from the [employer].  Prior to Admission medications   Medication Sig Start Date End Date Taking? Authorizing Provider  amLODipine (NORVASC) 10 MG tablet Take 1 tablet (10 mg total) by mouth daily. 10/12/11 10/11/12  Salley Scarlet, MD  triamcinolone cream (KENALOG) 0.1 % Apply 1 application topically 2 (two) times daily.  Historical Provider, MD   Physical Exam: Filed Vitals:   03/08/12 1019 03/08/12 1050 03/08/12 1221 03/08/12 1300  BP: 181/115 181/115  177/93  Pulse: 94 82    Temp: 98.3 F (36.8 C)  98.3 F (36.8 C)   TempSrc: Oral     Resp:    30  Height: 5\' 10"  (1.778 m)     Weight: 63.504 kg (140 lb)     SpO2: 100%        General:  Debilitated-appearing small framed 56 year old African-American man who appears to be older than his stated age. Head reveals no nodules lungs bruises or scratches. Normocephalic,  nontraumatic.  Eyes: Pupils are equal, round, and reactive to light. Extraocular movements are intact. Conjunctivae are clear. Sclerae are white.  ENT:   Oropharynx reveals moist mucous membranes. No teeth. No posterior exudates or erythema.  Neck: Supple, no adenopathy, no thyromegaly, no JVD.  Cardiovascular: S1, S2, with borderline tachycardia.  Respiratory: decreased breath sounds in the bases and clear anteriorly. Breathing is nonlabored.  Abdomen: positive bowel sounds, soft, nontender, nondistended.  Skin: he has fair turgor. There are multiple generalized psoriatic lesions, mostly papular, somewhat scaly, no erythema, no drainage.  Musculoskeletal: no acute hot red joints. Pedal pulses palpable.  Psychiatric: he is alert and oriented to himself, his brother, and [facility]. Currently, he is not tremulous. He denies depression. He denies anxiety.  Neurologic: Cranial nerves II through XII are grossly intact. Strength is 5 over 5 of his upper and lower extremities bilaterally. Sensation is grossly intact. No nystagmus.  Labs on Admission:  Basic Metabolic Panel:  Lab 03/08/12 0865  NA 131*  K 3.6  CL 90*  CO2 28  GLUCOSE 98  BUN 5*  CREATININE 0.93  CALCIUM 10.1  MG --  PHOS --   Liver Function Tests: No results found for this basename: AST:5,ALT:5,ALKPHOS:5,BILITOT:5,PROT:5,ALBUMIN:5 in the last 168 hours No results found for this basename: LIPASE:5,AMYLASE:5 in the last 168 hours No results found for this basename: AMMONIA:5 in the last 168 hours CBC:  Lab 03/08/12 1033  WBC 8.5  NEUTROABS 6.4  HGB 14.0  HCT 38.9*  MCV 92.6  PLT 137*   Cardiac Enzymes:  Lab 03/08/12 1033  CKTOTAL 258*  CKMB --  CKMBINDEX --  TROPONINI --    BNP (last 3 results) No results found for this basename: PROBNP:3 in the last 8760 hours CBG:  Lab 03/08/12 1109  GLUCAP 85    Radiological Exams on Admission: Ct Head Wo Contrast  03/08/2012  *RADIOLOGY  REPORT*  Clinical Data:   Seizures, hypertension  CT HEAD WITHOUT CONTRAST  Technique:  Contiguous axial images were obtained from the base of the skull through the vertex without contrast.  Comparison: None.  Findings: No skull fracture is noted.  No intracranial hemorrhage, mass effect or midline shift.  Paranasal sinuses and mastoid air cells are unremarkable.  Mild cerebral atrophy.  No acute infarction.  No mass lesion is noted on this unenhanced scan.  The gray and white matter differentiation is preserved.  IMPRESSION: No acute intracranial abnormality.  Mild cerebral atrophy.   Original Report Authenticated By: Natasha Mead, M.D.     EKG: no sinus rhythm, heart rate 84 beats per minute, nonspecific T-wave changes, borderline LVH.  Assessment/Plan Principal Problem:  *Alcohol withdrawal seizure Active Problems:  ALCOHOL ABUSE  NICOTINE ADDICTION  Dermatitis  Psoriasis, guttate  Malignant hypertension  Thrombocytopenia  Hyponatremia  Marijuana use   1. This is a  56 year old man with a history significant for alcohol abuse, marijuana use, tobacco abuse, who presents with a seizure. Given his history, it is likely that his seizure was secondary to alcohol withdrawal. His blood alcohol level is negative. He believes his last drink of beer was yesterday morning. This all points to alcohol withdrawal seizure. He has had a history of alcohol withdrawal syndrome during a previous hospitalization per his brother. His primary care physician Dr. [name] had referred him to alcohol detoxification centers and outpatient treatment. His brother states that he was accepted at one of the facilities, but then was refused because of his skin disease. His total CK is modestly elevated. His blood pressure is in the malignant range from the seizure and alcohol withdrawal. He has chronic psoriasis/dermatitis which is treated with a topical steroid. His hyponatremia may be secondary to excessive beer intake, but  volume depletion is also a consideration. His thrombocytopenia is likely secondary to chronic alcohol abuse.    Plan: 1. We'll admit the patient to the step down unit for closer observation. 2. He was given Ativan IV in the emergency department. 3. We'll start the Ativan alcohol withdrawal protocol with Ativan and vitamin therapy. 4. We'll start IV fluid hydration with normal saline. 5. Tobacco cessation counseling. The patient was advised to stop smoking both marijuana and tobacco. We'll start nicotine replacement therapy with a nicotine patch. We'll order tobacco cessation counseling. We'll consult the social worker for assistance with alcohol abstinence. 6. We'll continue Norvasc. We'll add clonidine for blood pressure control and its calming central nervous system effects. We'll also start gabapentin at 100 mg twice a day empirically. 7. For further evaluation, we'll order a magnesium level, INR, and hepatic function panel. 8. Protonix prophylactically in the setting of alcohol abuse.   Code Status: Full  Family Communication: discussed with his brother.  Disposition Plan: anticipated length of stay 48 hours.   Time spent: 1 hour.  Chanya Chrisley [facility]   If 7PM-7AM, please contact night-coverage [email or web address] Password [email or web address] 03/08/2012, 1:37 PM

## 2012-03-08 NOTE — ED Notes (Signed)
Brother reports pt fell this morning and started "jerking" and "foaming at the mouth."  Brother denies pt hitting head.  Brother reports pt was postictal afterwards, unaware of surrounds and states pt urinated on himself.  No hx of seizures.

## 2012-03-08 NOTE — Progress Notes (Signed)
  Subjective:    Patient ID: Stanley Jimenez, male    DOB: 19-Dec-1955, 56 y.o.   MRN: [id number]  HPI    Review of Systems     Objective:   Physical Exam        Assessment & Plan:   Pt not seen, he arrived late and also had urology appt about the same time, as his PSA is elevated and he needs evaluation for prostate cancer he was not seen in our office today

## 2012-03-08 NOTE — ED Provider Notes (Signed)
History  This chart was scribed for Stanley Gaskins, MD by Shari Heritage. The patient was seen in room APA14/APA14. Patient's care was started at 1019.     CSN: [id number]  Arrival date & time 03/08/12  1011   First MD Initiated Contact with Patient 03/08/12 1019      Chief Complaint  Patient presents with  . Seizures    Patient is a 56 y.o. male presenting with seizures. The history is provided by the patient. No language interpreter was used.  Seizures  This is a new problem. The current episode started 3 to 5 hours ago. The problem has been resolved. There was 1 seizure. The most recent episode lasted 2 to 5 minutes. Associated symptoms include patient experiences confusion and headaches. Pertinent negatives include no visual disturbance, no chest pain, no vomiting and no diarrhea. Characteristics include bladder incontinence. Characteristics do not include bit tongue. The episode was witnessed. The seizures did not continue in the ED. Possible causes include change in alcohol use. There were no medications administered prior to arrival.    HPI Comments: Stanley Jimenez is a 56 y.o. male who presents to the Emergency Department complaining of 5-minute long seizure episode onset 4 hours ago. There is associated HA, bladder incontinence during seizure, and confusion. Patient denies vision loss, weakness or numbness in legs, vomiting, diarrhea, chest pain or abdominal pain. Patient's brother states that he witnessed the seizure. Brother states that he saw the patient fall then observed the patient experiencing full body shakes and mouth foaming. He also states that patient was mildly confused and returned to baseline several minutes after the seizure concluded. Patient denies having bitten his tongue. Patient states he has never had a seizure before.  Per medical records, patient has a history of alcohol abuse. He states that he started drinking daily 5 years ago and prior to this he had  quit drinking for a number of years. Patient says that he drinks 2-3 beers (40 oz) almost every day and his last drink was yesterday at about 8:00am. Patient's other medical history includes HTN. He denies history of CVA or MI.   Past Medical History  Diagnosis Date  . Nicotine addiction   . Anxiety   . Depression   . Hypertension   . Alcohol abuse     History reviewed. No pertinent past surgical history.  Family History  Problem Relation Age of Onset  . Alcohol abuse Mother   . Alcohol abuse Sister   . Alcohol abuse Brother   . Alcohol abuse Brother   . Hypertension Sister     History  Substance Use Topics  . Smoking status: Current Every Day Smoker -- 0.5 packs/day    Types: Cigarettes  . Smokeless tobacco: Not on file  . Alcohol Use: Yes     daily      Review of Systems  Eyes: Negative for visual disturbance.  Cardiovascular: Negative for chest pain.  Gastrointestinal: Negative for vomiting, diarrhea and abdominal distention.  Genitourinary: Positive for bladder incontinence.  Neurological: Positive for seizures and headaches. Negative for weakness and numbness.  Psychiatric/Behavioral: Positive for confusion.  All other systems reviewed and are negative.    Allergies  Review of patient's allergies indicates no known allergies.  Home Medications   Current Outpatient Rx  Name Route Sig Dispense Refill  . AMLODIPINE BESYLATE 10 MG PO TABS Oral Take 1 tablet (10 mg total) by mouth daily. 30 tablet 3  . TRIAMCINOLONE ACETONIDE 0.1 %  EX CREA Topical Apply 1 application topically 2 (two) times daily.      BP 181/115  Pulse 94  Temp 98.3 F (36.8 C) (Oral)  Ht 5\' 10"  (1.778 m)  Wt 140 lb (63.504 kg)  BMI 20.09 kg/m2  SpO2 100%  Physical Exam CONSTITUTIONAL: Well developed/well nourished HEAD AND FACE: Normocephalic/atraumatic EYES: EOMI/PERRL ENMT: Mucous membranes moist. No tongue laceration. Edentulous. NECK: supple no meningeal signs SPINE:entire  spine nontender CV: S1/S2 noted, no murmurs/rubs/gallops noted LUNGS: Lungs are clear to auscultation bilaterally, no apparent distress ABDOMEN: soft, nontender, no rebound or guarding GU:no cva tenderness NEURO: Pt is awake/alert, moves all extremitiesx4 No arm or leg drift. No facial droop. Tremor noted to hands.  EXTREMITIES: pulses normal, full ROM. No tenderness noted, no deformity SKIN: warm, color normal. Patches of psoriasis to back PSYCH: no abnormalities of mood noted   ED Course  Procedures DIAGNOSTIC STUDIES: Oxygen Saturation is 100% on room air, normal by my interpretation.    COORDINATION OF CARE: 10:33am- Patient informed of current plan for treatment and evaluation and agrees with plan at this time.  Suspect ETOH withdrawal seizure given that he drinks everyday for 5 years.  His last drink was yesterday but he is unsure at what time.  No trauma noted to extremities/back/neck.  He is currently stable but does have tremor/HTN.  Ativan ordered.  Will follow closely  12:58 PM Pt stable, resting comfortably Tremors have improved D/w dr Sherrie Mustache, will admit to stepdown for likely ETOH withdrawal    Labs Reviewed  CBC WITH DIFFERENTIAL  BASIC METABOLIC PANEL      MDM  Nursing notes including past medical history and social history reviewed and considered in documentation Labs/vital reviewed and considered      Date: 03/08/2012  Rate: 84  Rhythm: normal sinus rhythm  QRS Axis: normal  Intervals: normal  ST/T Wave abnormalities: nonspecific ST changes  Conduction Disutrbances:none      I personally performed the services described in this documentation, which was scribed in my presence. The recorded information has been reviewed and considered.      Stanley Gaskins, MD 03/08/12 1258

## 2012-03-09 ENCOUNTER — Encounter (HOSPITAL_COMMUNITY): Payer: Self-pay | Admitting: Internal Medicine

## 2012-03-09 DIAGNOSIS — F10939 Alcohol use, unspecified with withdrawal, unspecified: Principal | ICD-10-CM

## 2012-03-09 DIAGNOSIS — I472 Ventricular tachycardia: Secondary | ICD-10-CM | POA: Diagnosis present

## 2012-03-09 DIAGNOSIS — K7010 Alcoholic hepatitis without ascites: Secondary | ICD-10-CM

## 2012-03-09 DIAGNOSIS — I10 Essential (primary) hypertension: Secondary | ICD-10-CM

## 2012-03-09 DIAGNOSIS — R569 Unspecified convulsions: Secondary | ICD-10-CM

## 2012-03-09 DIAGNOSIS — F10239 Alcohol dependence with withdrawal, unspecified: Principal | ICD-10-CM

## 2012-03-09 DIAGNOSIS — F1010 Alcohol abuse, uncomplicated: Secondary | ICD-10-CM

## 2012-03-09 LAB — COMPREHENSIVE METABOLIC PANEL
ALT: 58 U/L — ABNORMAL HIGH (ref 0–53)
AST: 57 U/L — ABNORMAL HIGH (ref 0–37)
Albumin: 3.3 g/dL — ABNORMAL LOW (ref 3.5–5.2)
CO2: 25 mEq/L (ref 19–32)
Calcium: 9.5 mg/dL (ref 8.4–10.5)
GFR calc non Af Amer: >90 mL/min (ref >90)
Sodium: 133 mEq/L — ABNORMAL LOW (ref 135–145)

## 2012-03-09 LAB — CK: Total CK: 272 U/L — ABNORMAL HIGH (ref 7–232)

## 2012-03-09 MED ORDER — METOPROLOL TARTRATE 25 MG PO TABS
25.0000 mg | ORAL_TABLET | Freq: Two times a day (BID) | ORAL | Status: DC
  Administered 2012-03-09: 25 mg via ORAL
  Filled 2012-03-09: qty 1

## 2012-03-09 MED ORDER — METOPROLOL TARTRATE 25 MG PO TABS: 25.0000 mg | ORAL_TABLET | Freq: Two times a day (BID) | ORAL | Status: DC

## 2012-03-09 MED ORDER — MAGNESIUM SULFATE 40 MG/ML IJ SOLN
2.0000 g | Freq: Once | INTRAMUSCULAR | Status: AC
  Administered 2012-03-09 (×2): 2 g via INTRAVENOUS
  Filled 2012-03-09: qty 50

## 2012-03-09 MED ORDER — GABAPENTIN 100 MG PO CAPS: 100.0000 mg | ORAL_CAPSULE | Freq: Two times a day (BID) | ORAL | Status: DC

## 2012-03-09 MED ORDER — MAGNESIUM SULFATE 40 MG/ML IJ SOLN: 2.0000 g | Freq: Once | INTRAMUSCULAR | Status: DC

## 2012-03-09 MED ORDER — THIAMINE HCL 100 MG PO TABS: 100.0000 mg | ORAL_TABLET | Freq: Every day | ORAL | Status: DC

## 2012-03-09 MED ORDER — LORAZEPAM 0.5 MG PO TABS: 0.5000 mg | ORAL_TABLET | Freq: Two times a day (BID) | ORAL | Status: DC

## 2012-03-09 MED ORDER — MAGNESIUM SULFATE 50 % IJ SOLN
2.0000 g | Freq: Once | INTRAVENOUS | Status: DC
  Filled 2012-03-09: qty 4

## 2012-03-09 NOTE — Progress Notes (Signed)
Patient went into V-tach up to 180's and was asymptomatic, [facility] let us know that they would be closely monitoring him as well. His HR has since dropped back down to the 80's to 90's. Will continue to monitor.

## 2012-03-09 NOTE — Clinical Social Work Psychosocial (Signed)
    Clinical Social Work Department BRIEF PSYCHOSOCIAL ASSESSMENT 03/09/2012  Patient:  Stanley Jimenez, Stanley Jimenez     Account Number:  [id number]     Admit date:  03/08/2012  Clinical Social Worker:  Santa Genera, CLINICAL SOCIAL WORKER  Date/Time:  03/09/2012 10:30 AM  Referred by:  Physician  Date Referred:  03/09/2012 Referred for  Substance Abuse   Other Referral:   Interview type:  Patient Other interview type:    PSYCHOSOCIAL DATA Living Status:  FAMILY Admitted from facility:   Level of care:   Primary support name:  Stanley Jimenez Primary support relationship to patient:  SIBLING Degree of support available:   Patient lives w brother    CURRENT CONCERNS Current Concerns  Substance Abuse   Other Concerns:    SOCIAL WORK ASSESSMENT / PLAN CSW met w patient, alert and oriented x4.  Completed SBIRT w patient, w score of 32, indicating high risk drinking. Review of records from PCP indicate that patient has been referred for alcohol detox multiple times, but has not contacted any resources given.  Patient states that he begins drinking approx 7 AM when store opens and he can purchase beer.  Patient estimates his consumption at 3 - 4 30 oz beers; however, he admits that he loses track of his intake because he primarily drinks w friends and "everyone buys beer."  Patient may underestimate his consumption. Patient states that he had one 8 year period of sobriety, initiated by a DWI conviction which required community service and SA treatment.  Patient began drinking again in 2001.  He cannot remember what led to resumption of drinking, but states that he lost his "future wife" who left him due to his drinking.  Patient lives w his cousin who leaves in AM for his job, patient has been on SSI due to both medical and psychiatric issues.  Patient cannot remember his psychiatric diagnosis, and says he used to take psych meds but stopped them years ago.  Patient says he has had a variety of  jobs, but has not worked in quite some time.    Patient admits that he has been referred to SA treatment but did not go because he lacked transportation.  When asked how he gets to his MD apointment, he named several familiy members who transport him but said that he did not want to  "bother anyone" about taking him to SA treatment.    Patient says that he wants to quit drinking, and will be given referrals to [facility] and [facility], both outpatient SA treatment providers who can assist w referrals to both outpatient and inpatient SA  treatment providers.   Assessment/plan status:  Referral to [facility] Other assessment/ plan:   Information/referral to community resources:   Therapist, [facility] [phone])  List of AA/NA meetings in [place]    PATIENT'S/FAMILY'S RESPONSE TO PLAN OF CARE: Patient willing to accept referrals   Santa Genera, LCSW Clinical Social Worker [phone])

## 2012-03-09 NOTE — Discharge Summary (Addendum)
Physician Discharge Summary  Stanley Jimenez:[id number] DOB: [birth year] DOA: 03/08/2012  PCP: Stanley Overman, MD  Admit date: 03/08/2012 Discharge date: 03/09/2012  Recommendations for Outpatient Follow-up:  1. The patient was discharged to home in improved and stable condition. He will followup with his primary care physician Dr. Jeanice Jimenez next week. He was referred to [facility] and other resources to help with alcohol abstinence.  Discharge Diagnoses:  1. Alcohol withdrawal seizure. (Mild alcoholic withdrawal symptoms during hospitalization). 2. Alcohol abuse. 3. Tobacco abuse/nicotine addiction/marijuana use. 4. Nonsustained run of ventricular tachycardia. 5. Malignant hypertension. 6. Hyponatremia. His serum sodium was 131 at admission and 133 at the time of discharge. 7. Thrombocytopenia, likely secondary to chronic alcohol abuse. His platelet count was 137. 8. Mild alcoholic hepatitis. His AST was 57 and is ALT was 58 at the time of discharge. 9. Chronic dermatitis/psoriasis, gluttate  Discharge Condition: Improved.  Diet recommendation: Heart healthy.  Filed Weights   03/08/12 1019 03/08/12 1420 03/09/12 0500  Weight: 63.504 kg (140 lb) 63.6 kg (140 lb 3.4 oz) 64.5 kg (142 lb 3.2 oz)    History of present illness:  The patient is a 56 year old man with a history significant for alcohol abuse, hypertension, and cirrhosis, who presented to the emergency department after a witnessed seizure. Apparently, the patient was witnessed by his brother having a tonic-clonic seizure. There was no head trauma. The patient was incontinent of his bladder and bowels. When he arrived to the emergency department, he was seizure-free. In the emergency department, he was mildly tachycardic with a heart rate of 102 beats per minute and hypertensive with a blood pressure 181/115. CT scan of his head revealed no acute intracranial abnormality. His lab data were significant for a serum sodium of 131,  total CK of 258, platelet count of 137, and alcohol level of less than 11. His urine drug screen was positive only for THC. He was admitted for further evaluation and management.  Hospital Course:  Per history, the patient chronically drinks 2-3 (40 ounce) beers daily. He apparently had not drunk any beer for nearly 24 hours prior to the hospitalization. His alcohol level was consistent with this as it was less than 11. He was given IV Ativan followed by oral Ativan in the emergency department empirically, although, he had only one witnessed seizure at home and none in the emergency department. He was admitted to the step down unit for closer monitoring. The Ativan alcohol withdrawal protocol was initiated with Ativan as needed and vitamin therapy. IV fluid hydration was started with normal saline and potassium chloride added. Transdermal Catapres was ordered for treatment of hypertension and secondarily for its sedating central nervous system effects. Gabapentin was also started empirically. Norvasc was continued. A nicotine patch was placed for nicotine replacement therapy. He was advised to stop smoking both cigarettes and marijuana, although this was not the main focus during the hospitalization. He was certainly encouraged and advised to stop drinking alcohol permanently. The social worker was consulted and provided the patient with resources that would help with alcohol abstinence. His family was supportive.  Additional studies were ordered. His followup total CK increased a little to 272, but was certainly not in the rhabdomyolysis range. His vitamin B12 level was within normal limits at 809. His TSH was within normal limits at 0.48. His followup hepatic transaminases decreased some but did not completely normalize. His total bilirubin was within normal limits at 0.9. His followup EKG reveals sinus tachycardia with a heart rate  of 106 beats per minute.   The patient remained tachycardic  intermittently. He had a reported short run of ventricular tachycardia. He was given Ativan, and shortly thereafter, the tachycardia resolved. For this reason, metoprolol was started. Upon discharge, he was prescribed metoprolol 25 mg twice a day. Amlodipine was discontinued as his blood pressure improved. It may need to be restarted, but this will be deferred to his primary care physician. The Catapres patch was discontinued upon discharge. Also, for borderline hypomagnesemia and hypokalemia, he was given potassium chloride in the IV fluids and orally. He was given 2 g of magnesium sulfate.  The patient was alert and oriented throughout the hospitalization. He was receptive to recommendations. There were no overt behavioral changes. He had no further seizures during the hospitalization. He was instructed to take gabapentin twice a day and lorazepam twice a day for 7 more days to decrease his risk of having another alcohol withdrawal seizure. He voiced understanding. He was instructed to not drink alcohol once again. He was instructed to seek assistance for alcohol abstinence as discussed with him.    Procedures:  None  Consultations:  None  Discharge Exam: Filed Vitals:   03/09/12 0950 03/09/12 1000 03/09/12 1100 03/09/12 1142  BP: 101/79 156/97 140/89   Pulse:  98 109   Temp:    98.6 F (37 C)  TempSrc:    Oral  Resp:  19 22   Height:      Weight:      SpO2:  99% 98%     General: Pleasant 56 year old African-American man sitting up in bed, in no acute distress. His family is in the room. Cardiovascular: S1, S2, with borderline tachycardia. Respiratory: Clear to auscultation bilaterally. Neurologic: He is alert and oriented x3. Cranial nerves II through XII are intact. His speech is clear.  Discharge Instructions  Discharge Orders    Future Appointments: Provider: Department: Dept Phone: Center:   03/16/2012 10:15 AM Salley Scarlet, MD [facility] [phone] [facility]       Future Orders Please Complete By Expires   Diet - low sodium heart healthy      Increase activity slowly      Discharge instructions      Comments:   Do not drink alcohol. Amlodipine has been discontinued. You should start your new blood pressure medicine this evening.       Medication List     As of 03/09/2012 12:43 PM    STOP taking these medications         amLODipine 10 MG tablet   Commonly known as: NORVASC      TAKE these medications         gabapentin 100 MG capsule   Commonly known as: NEURONTIN   Take 1 capsule (100 mg total) by mouth 2 (two) times daily. Take this medication for 7 more days to decrease withdrawal symptoms.      LORazepam 0.5 MG tablet   Commonly known as: ATIVAN   Take 1 tablet (0.5 mg total) by mouth 2 (two) times daily. Take this medication 2 times daily for 7 more days to decrease withdrawal symptoms.      metoprolol tartrate 25 MG tablet   Commonly known as: LOPRESSOR   Take 1 tablet (25 mg total) by mouth 2 (two) times daily. Medication for your blood pressure and heart rate.      thiamine 100 MG tablet   Take 1 tablet (100 mg total) by mouth daily. B.  vitamin.      triamcinolone cream 0.1 %   Commonly known as: KENALOG   Apply 1 application topically 2 (two) times daily.           Follow-up Information    Follow up with Milinda Antis, MD. On 03/16/2012. (at 10:15 am)    Contact information:   [address], [address] [place] [address] [phone]           The results of significant diagnostics from this hospitalization (including imaging, microbiology, ancillary and laboratory) are listed below for reference.    Significant Diagnostic Studies: Ct Head Wo Contrast  03/08/2012  *RADIOLOGY REPORT*  Clinical Data:   Seizures, hypertension  CT HEAD WITHOUT CONTRAST  Technique:  Contiguous axial images were obtained from the base of the skull through the vertex without contrast.  Comparison: None.  Findings: No  skull fracture is noted.  No intracranial hemorrhage, mass effect or midline shift.  Paranasal sinuses and mastoid air cells are unremarkable.  Mild cerebral atrophy.  No acute infarction.  No mass lesion is noted on this unenhanced scan.  The gray and white matter differentiation is preserved.  IMPRESSION: No acute intracranial abnormality.  Mild cerebral atrophy.   Original Report Authenticated By: Natasha Mead, M.D.     Microbiology: Recent Results (from the past 240 hour(s))  MRSA PCR SCREENING     Status: Normal   Collection Time   03/08/12  2:42 PM      Component Value Range Status Comment   MRSA by PCR NEGATIVE  NEGATIVE Final      Labs: Basic Metabolic Panel:  Lab 03/09/12 9147 03/08/12 1033  NA 133* 131*  K 4.0 3.6  CL 97 90*  CO2 25 28  GLUCOSE 87 98  BUN 6 5*  CREATININE 0.91 0.93  CALCIUM 9.5 10.1  MG 1.6 --  PHOS -- --   Liver Function Tests:  Lab 03/09/12 0458 03/08/12 1033  AST 57* 62*  ALT 58* 70*  ALKPHOS 158* 187*  BILITOT 0.9 0.9  PROT 7.3 8.6*  ALBUMIN 3.3* 4.0   No results found for this basename: LIPASE:5,AMYLASE:5 in the last 168 hours No results found for this basename: AMMONIA:5 in the last 168 hours CBC:  Lab 03/08/12 1033  WBC 8.5  NEUTROABS 6.4  HGB 14.0  HCT 38.9*  MCV 92.6  PLT 137*   Cardiac Enzymes:  Lab 03/09/12 0458 03/08/12 1033  CKTOTAL 272* 258*  CKMB -- --  CKMBINDEX -- --  TROPONINI -- --   BNP: BNP (last 3 results) No results found for this basename: PROBNP:3 in the last 8760 hours CBG:  Lab 03/08/12 1109  GLUCAP 85    Time coordinating discharge: Greater than 30  minutes  Signed:  Demecia Northway  [facility] 03/09/2012, 12:43 PM

## 2012-03-09 NOTE — Progress Notes (Signed)
Patient and sister given discharge instructions. Patient and sister verbalized understanding of instructions and follow up appointments with Dr. [name] and [facility]. Patient alert, oriented and in stable condition at the time of discharge. Patient's IV removed before discharge. Site WNL. Patient being discharged home with family.

## 2012-03-09 NOTE — Clinical Social Work Note (Signed)
Patient has appointment for hospital discharge SA evaluation at [facility]: [facility] Rhonda W in [facility] (Monday October 21) between 7:45 and 11 AM to be evaluated at their clinic.  Patient will then be referred into appropriate level of care for SA treatment.  Patient given referral information.  Family states they will transport patient.  Patient also given [employer] guide w information about Medicaid transport.  Santa Genera, LCSW Clinical Social Worker [phone])

## 2012-03-09 NOTE — Progress Notes (Signed)
UR Chart Review Completed  

## 2012-03-09 NOTE — Progress Notes (Signed)
Gave 1 mg of ativan for HR up to 180's.

## 2012-03-16 ENCOUNTER — Ambulatory Visit (INDEPENDENT_AMBULATORY_CARE_PROVIDER_SITE_OTHER): Payer: Medicaid Other | Admitting: Family Medicine

## 2012-03-16 ENCOUNTER — Encounter: Payer: Self-pay | Admitting: Family Medicine

## 2012-03-16 VITALS — BP 130/76 | HR 88 | Resp 18 | Wt 147.0 lb

## 2012-03-16 DIAGNOSIS — F10939 Alcohol use, unspecified with withdrawal, unspecified: Secondary | ICD-10-CM

## 2012-03-16 DIAGNOSIS — R569 Unspecified convulsions: Secondary | ICD-10-CM

## 2012-03-16 DIAGNOSIS — I10 Essential (primary) hypertension: Secondary | ICD-10-CM

## 2012-03-16 DIAGNOSIS — L404 Guttate psoriasis: Secondary | ICD-10-CM

## 2012-03-16 DIAGNOSIS — F10239 Alcohol dependence with withdrawal, unspecified: Secondary | ICD-10-CM

## 2012-03-16 DIAGNOSIS — L408 Other psoriasis: Secondary | ICD-10-CM

## 2012-03-16 DIAGNOSIS — H6120 Impacted cerumen, unspecified ear: Secondary | ICD-10-CM

## 2012-03-16 DIAGNOSIS — F1010 Alcohol abuse, uncomplicated: Secondary | ICD-10-CM

## 2012-03-16 MED ORDER — HYDROXYZINE PAMOATE 25 MG PO CAPS: 25.0000 mg | ORAL_CAPSULE | Freq: Two times a day (BID) | ORAL | Status: DC | PRN

## 2012-03-16 NOTE — Progress Notes (Signed)
  Subjective:    Patient ID: Stanley Jimenez, male    DOB: 1955-09-14, 56 y.o.   MRN: [id number]  HPI  Pt here to f/u hospitalization for seizures secondary to alcohol withdrawal. He states that he had not had anything to drink when he was on the floor by his family members. He's been alcohol free since his discharge from hospital on 10/17. He has a followup appointment scheduled with [facility] on October 30 at 10:30 AM with Dr. Doristine Mango. He's also reschedule his urology appointment for his elevated he has a. His only other concern today is left ear pain states he has difficulty hearing out of it and it feels irritated. He's tried to clean it. He states that his psoriasis has been acting up and he was restarted medications that were given by his dermatologist for a few months ago  Review of Systems  per above  GEN- denies fatigue, fever, weight loss,weakness, recent illness HEENT- denies eye drainage, change in vision, +nasal discharge, CVS- denies chest pain, palpitations RESP- denies SOB, cough, wheeze ABD- denies N/V, change in stools, abd pain GU- denies dysuria, hematuria, dribbling, incontinence MSK- denies joint pain, muscle aches, injury Neuro- denies headache, dizziness, syncope, seizure activity      Objective:   Physical Exam  GEN- NAD, alert and oriented x3, smell of alcohol HEENT- PERRL, EOMI, MMM, oropharynx clear, non icteric, Left ear/canal impacted with cereumen,  Right canal clear, TM clear no effusion, nares clear rhinorrhea CVS- RRR, no murmur RESP-CTAB EXT- No edema Pulses- Radial, DP- 2+ Skin- multiple generalized psoriatic lesions Psych- sober today, not depressed appearing or overly anxious Neuro- No tremor     Assessment & Plan:

## 2012-03-16 NOTE — Patient Instructions (Addendum)
Take the prednisone 1 tablet twice a day  New prescription for itching medicine  Continue blood pressure pills- Norvasc 10mg  and Metoprolol  Continue the medicines from the hospital and follow-up with Dr. Geanie Cooley  Thiamine vitamin needed 1 a day- get from drug store Multivitamin 1 a day - get these from drug store Please let him know when the Urology appt is F/U 3 months with Dr. Lodema Hong

## 2012-03-17 DIAGNOSIS — H6120 Impacted cerumen, unspecified ear: Secondary | ICD-10-CM | POA: Insufficient documentation

## 2012-03-17 NOTE — Assessment & Plan Note (Signed)
He has an old bottle of prednisone as well as an old bottle of erythromycin which has expired. The prednisone is still good therefore will have him take one tablet twice a day for his psoriasis.

## 2012-03-17 NOTE — Assessment & Plan Note (Signed)
He's been alcohol free for one week now. He does have followup with mental health and substance abuse counseling. Hopefully he will stick to the cessation. He's take thiamine along with multivitamin.

## 2012-03-17 NOTE — Assessment & Plan Note (Signed)
Blood pressure improved today we'll continue metoprolol and Norvasc

## 2012-03-17 NOTE — Assessment & Plan Note (Signed)
No further seizure activity no signs of withdrawal today

## 2012-04-27 ENCOUNTER — Emergency Department (HOSPITAL_COMMUNITY)
Admission: EM | Admit: 2012-04-27 | Discharge: 2012-04-27 | Disposition: A | Discharge status: Home / Self Care | Payer: Medicaid Other | Attending: Emergency Medicine | Admitting: Emergency Medicine

## 2012-04-27 ENCOUNTER — Telehealth: Payer: Self-pay | Admitting: Family Medicine

## 2012-04-27 ENCOUNTER — Encounter (HOSPITAL_COMMUNITY): Payer: Self-pay | Admitting: Emergency Medicine

## 2012-04-27 ENCOUNTER — Emergency Department (HOSPITAL_COMMUNITY): Payer: Medicaid Other

## 2012-04-27 DIAGNOSIS — I10 Essential (primary) hypertension: Secondary | ICD-10-CM | POA: Insufficient documentation

## 2012-04-27 DIAGNOSIS — Z8719 Personal history of other diseases of the digestive system: Secondary | ICD-10-CM | POA: Insufficient documentation

## 2012-04-27 DIAGNOSIS — F17200 Nicotine dependence, unspecified, uncomplicated: Secondary | ICD-10-CM | POA: Insufficient documentation

## 2012-04-27 DIAGNOSIS — Z872 Personal history of diseases of the skin and subcutaneous tissue: Secondary | ICD-10-CM | POA: Insufficient documentation

## 2012-04-27 DIAGNOSIS — Z79899 Other long term (current) drug therapy: Secondary | ICD-10-CM | POA: Insufficient documentation

## 2012-04-27 DIAGNOSIS — S139XXA Sprain of joints and ligaments of unspecified parts of neck, initial encounter: Secondary | ICD-10-CM | POA: Insufficient documentation

## 2012-04-27 DIAGNOSIS — Z8679 Personal history of other diseases of the circulatory system: Secondary | ICD-10-CM | POA: Insufficient documentation

## 2012-04-27 DIAGNOSIS — T148XXA Other injury of unspecified body region, initial encounter: Secondary | ICD-10-CM

## 2012-04-27 DIAGNOSIS — Z8659 Personal history of other mental and behavioral disorders: Secondary | ICD-10-CM | POA: Insufficient documentation

## 2012-04-27 DIAGNOSIS — IMO0002 Reserved for concepts with insufficient information to code with codable children: Secondary | ICD-10-CM | POA: Insufficient documentation

## 2012-04-27 DIAGNOSIS — Y939 Activity, unspecified: Secondary | ICD-10-CM | POA: Insufficient documentation

## 2012-04-27 DIAGNOSIS — M47812 Spondylosis without myelopathy or radiculopathy, cervical region: Secondary | ICD-10-CM | POA: Insufficient documentation

## 2012-04-27 DIAGNOSIS — F1020 Alcohol dependence, uncomplicated: Secondary | ICD-10-CM | POA: Insufficient documentation

## 2012-04-27 DIAGNOSIS — Y929 Unspecified place or not applicable: Secondary | ICD-10-CM | POA: Insufficient documentation

## 2012-04-27 DIAGNOSIS — X58XXXA Exposure to other specified factors, initial encounter: Secondary | ICD-10-CM | POA: Insufficient documentation

## 2012-04-27 MED ORDER — CYCLOBENZAPRINE HCL 10 MG PO TABS
10.0000 mg | ORAL_TABLET | Freq: Once | ORAL | Status: AC
  Administered 2012-04-27: 10 mg via ORAL
  Filled 2012-04-27: qty 1

## 2012-04-27 MED ORDER — IBUPROFEN 800 MG PO TABS
800.0000 mg | ORAL_TABLET | Freq: Once | ORAL | Status: AC
  Administered 2012-04-27: 800 mg via ORAL
  Filled 2012-04-27: qty 1

## 2012-04-27 MED ORDER — CYCLOBENZAPRINE HCL 10 MG PO TABS: ORAL_TABLET | ORAL | Status: DC

## 2012-04-27 NOTE — Telephone Encounter (Signed)
noted 

## 2012-04-27 NOTE — ED Notes (Signed)
Pt c/o neck pain started yesterday, worse this am with movement. Denies injury. Nad.

## 2012-04-27 NOTE — Telephone Encounter (Signed)
fyi

## 2012-04-27 NOTE — ED Provider Notes (Signed)
Medical screening examination/treatment/procedure(s) were performed by non-physician practitioner and as supervising physician I was immediately available for consultation/collaboration.   Lyanne Co, MD 04/27/12 [phone]

## 2012-04-27 NOTE — ED Provider Notes (Signed)
History     CSN: [id number]  Arrival date & time 04/27/12  0804   First MD Initiated Contact with Patient 04/27/12 [phone]      Chief Complaint  Patient presents with  . Neck Pain    (Consider location/radiation/quality/duration/timing/severity/associated sxs/prior treatment) HPI Comments: No known injury.  No fever or chills.  No prev problems with neck.  Pt of dr. [name].  Patient is a 56 y.o. male presenting with neck pain. The history is provided by the patient. No language interpreter was used.  Neck Pain  This is a new problem. Episode onset: yest AM. The problem occurs constantly. The problem has not changed since onset.The pain is associated with an unknown factor. There has been no fever. The pain is present in the generalized neck. The quality of the pain is described as aching. The pain is moderate. The symptoms are aggravated by bending and twisting. The pain is the same all the time. Pertinent negatives include no headaches. He has tried nothing for the symptoms.    Past Medical History  Diagnosis Date  . Nicotine addiction   . Anxiety   . Depression   . Hypertension   . Alcohol abuse   . 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 Dermatitis 09/24/2010  . Anxiety state, unspecified 12/22/2007    Qualifier: Diagnosis of  By: Doreene Nest LPN, Marijean Niemann    . Elevated PSA 12/29/2010  . Ventricular tachycardia, non-sustained 03/09/2012  . Alcoholic hepatitis 03/09/2012    History reviewed. No pertinent past surgical history.  Family History  Problem Relation Age of Onset  . Alcohol abuse Mother   . Alcohol abuse Sister   . Alcohol abuse Brother   . Alcohol abuse Brother   . Hypertension Sister     History  Substance Use Topics  . Smoking status: Current Every Day Smoker -- 0.5 packs/day    Types: Cigarettes  . Smokeless tobacco: Not on file  . Alcohol Use: No     Comment: quit since october 2013      Review of Systems  Constitutional: Negative for fever and chills.  HENT:  Positive for neck pain.   Neurological: Negative for headaches.    Allergies  Review of patient's allergies indicates no known allergies.  Home Medications   Current Outpatient Rx  Name  Route  Sig  Dispense  Refill  . AMLODIPINE BESYLATE 10 MG PO TABS   Oral   Take 10 mg by mouth daily.         . CYCLOBENZAPRINE HCL 10 MG PO TABS      1/2 to one tab po TID   20 tablet   0   . GABAPENTIN 100 MG PO CAPS   Oral   Take 1 capsule (100 mg total) by mouth 2 (two) times daily. Take this medication for 7 more days to decrease withdrawal symptoms.   14 capsule   0   . HYDROXYZINE PAMOATE 25 MG PO CAPS   Oral   Take 1 capsule (25 mg total) by mouth 2 (two) times daily as needed for itching.   30 capsule   0   . LORAZEPAM 0.5 MG PO TABS   Oral   Take 1 tablet (0.5 mg total) by mouth 2 (two) times daily. Take this medication 2 times daily for 7 more days to decrease withdrawal symptoms.   14 tablet   0   . METOPROLOL TARTRATE 25 MG PO TABS   Oral   Take 1 tablet (25 mg  total) by mouth 2 (two) times daily. Medication for your blood pressure and heart rate.   60 tablet   2   . PREDNISONE 10 MG PO TABS   Oral   Take 10 mg by mouth 2 (two) times daily.         . THIAMINE HCL 100 MG PO TABS   Oral   Take 1 tablet (100 mg total) by mouth daily. B. vitamin.         Marland Kitchen TRIAMCINOLONE ACETONIDE 0.1 % EX CREA   Topical   Apply 1 application topically 2 (two) times daily.           BP 129/83  Pulse 92  Temp 98.1 F (36.7 C)  Resp 17  SpO2 99%  Physical Exam  Nursing note and vitals reviewed. Constitutional: He is oriented to person, place, and time. He appears well-developed and well-nourished.  HENT:  Head: Normocephalic and atraumatic.  Eyes: EOM are normal.  Neck: Phonation normal.    Cardiovascular: Normal rate, regular rhythm, normal heart sounds and intact distal pulses.   Pulmonary/Chest: Effort normal and breath sounds normal. No respiratory  distress.  Abdominal: Soft. He exhibits no distension. There is no tenderness.  Musculoskeletal: Normal range of motion.  Lymphadenopathy:    He has no cervical adenopathy.  Neurological: He is alert and oriented to person, place, and time.  Skin: Skin is warm and dry.  Psychiatric: He has a normal mood and affect. Judgment normal.    ED Course  Procedures (including critical care time)  Labs Reviewed - No data to display Dg Cervical Spine Complete  04/27/2012  *RADIOLOGY REPORT*  Clinical Data: Pain, no known trauma, arthritis  CERVICAL SPINE - COMPLETE 4+ VIEW  Comparison: None.  Findings: Seven views of the cervical spine submitted.  No acute fracture is noted.  Mild degenerative changes noted C1-C2 articulation.  There is mild about 3 mm anterolisthesis C3 on C4 vertebral body.  Minimal about 2 mm anterolisthesis of the C4 on C5 vertebral body.  Mild anterior spurring lower endplate of the C4 vertebral body.  There is disc space flattening with mild anterior and minimal posterior spurring at C5-C6 level.  Mild disc space flattening with mild endplate anterior spurring at C6-C7 level.  No prevertebral soft tissue swelling.  Cervical airway is patent.  IMPRESSION: No acute fracture.  Mild anterolisthesis about 3 mm of C3 on C4 vertebral body. Minimal anterolisthesis C4 on C5 vertebral body. Degenerative changes as described above.   Original Report Authenticated By: Natasha Mead, M.D.      1. Muscle strain   2. Cervical spine arthritis       MDM  Ibuprofen rx-flexeril F/u with PCP prn        Evalina Field, PA 04/27/12 [phone]

## 2012-05-16 ENCOUNTER — Ambulatory Visit (INDEPENDENT_AMBULATORY_CARE_PROVIDER_SITE_OTHER): Payer: Medicaid Other | Admitting: Urology

## 2012-05-16 ENCOUNTER — Other Ambulatory Visit: Payer: Self-pay | Admitting: Urology

## 2012-05-16 DIAGNOSIS — R972 Elevated prostate specific antigen [PSA]: Secondary | ICD-10-CM

## 2012-05-16 DIAGNOSIS — N320 Bladder-neck obstruction: Secondary | ICD-10-CM

## 2012-06-16 ENCOUNTER — Ambulatory Visit: Payer: Medicaid Other | Admitting: Family Medicine

## 2012-06-27 ENCOUNTER — Ambulatory Visit (HOSPITAL_COMMUNITY): Admission: RE | Admit: 2012-06-27 | Payer: Medicaid Other | Source: Ambulatory Visit

## 2012-06-27 ENCOUNTER — Other Ambulatory Visit: Payer: Self-pay | Admitting: Urology

## 2012-06-27 ENCOUNTER — Encounter (HOSPITAL_COMMUNITY): Payer: Medicaid Other

## 2012-06-27 DIAGNOSIS — R972 Elevated prostate specific antigen [PSA]: Secondary | ICD-10-CM

## 2012-07-06 ENCOUNTER — Ambulatory Visit: Payer: Medicaid Other | Admitting: Family Medicine

## 2012-07-19 ENCOUNTER — Ambulatory Visit: Payer: Medicaid Other | Admitting: Family Medicine

## 2012-08-10 ENCOUNTER — Ambulatory Visit: Payer: Medicaid Other | Admitting: Family Medicine

## 2012-10-11 ENCOUNTER — Other Ambulatory Visit: Payer: Self-pay | Admitting: Family Medicine

## 2012-10-11 ENCOUNTER — Telehealth: Payer: Self-pay | Admitting: Family Medicine

## 2012-10-11 DIAGNOSIS — L409 Psoriasis, unspecified: Secondary | ICD-10-CM

## 2012-12-27 NOTE — Telephone Encounter (Signed)
error 

## 2013-12-17 ENCOUNTER — Ambulatory Visit: Payer: Medicaid Other | Admitting: Family Medicine

## 2014-01-01 ENCOUNTER — Ambulatory Visit: Payer: Medicaid Other | Admitting: Family Medicine

## 2014-01-17 ENCOUNTER — Ambulatory Visit (INDEPENDENT_AMBULATORY_CARE_PROVIDER_SITE_OTHER): Payer: Medicaid Other | Admitting: Family Medicine

## 2014-01-17 ENCOUNTER — Other Ambulatory Visit: Payer: Self-pay | Admitting: Family Medicine

## 2014-01-17 ENCOUNTER — Encounter (INDEPENDENT_AMBULATORY_CARE_PROVIDER_SITE_OTHER): Payer: Self-pay

## 2014-01-17 ENCOUNTER — Encounter: Payer: Self-pay | Admitting: Family Medicine

## 2014-01-17 VITALS — BP 160/90 | HR 84 | Resp 16 | Ht 69.0 in | Wt 136.0 lb

## 2014-01-17 DIAGNOSIS — R634 Abnormal weight loss: Secondary | ICD-10-CM

## 2014-01-17 DIAGNOSIS — F1020 Alcohol dependence, uncomplicated: Secondary | ICD-10-CM

## 2014-01-17 DIAGNOSIS — K7010 Alcoholic hepatitis without ascites: Secondary | ICD-10-CM

## 2014-01-17 DIAGNOSIS — F411 Generalized anxiety disorder: Secondary | ICD-10-CM

## 2014-01-17 DIAGNOSIS — F1290 Cannabis use, unspecified, uncomplicated: Secondary | ICD-10-CM

## 2014-01-17 DIAGNOSIS — F1210 Cannabis abuse, uncomplicated: Secondary | ICD-10-CM

## 2014-01-17 DIAGNOSIS — F1010 Alcohol abuse, uncomplicated: Secondary | ICD-10-CM

## 2014-01-17 DIAGNOSIS — Z125 Encounter for screening for malignant neoplasm of prostate: Secondary | ICD-10-CM

## 2014-01-17 DIAGNOSIS — I10 Essential (primary) hypertension: Secondary | ICD-10-CM

## 2014-01-17 DIAGNOSIS — R972 Elevated prostate specific antigen [PSA]: Secondary | ICD-10-CM

## 2014-01-17 LAB — HEMOGLOBIN A1C
Hgb A1c MFr Bld: 5.9 % — ABNORMAL HIGH (ref <5.7)
Mean Plasma Glucose: 123 mg/dL — ABNORMAL HIGH (ref <117)

## 2014-01-17 LAB — CBC WITH DIFFERENTIAL/PLATELET
Basophils Absolute: 0.1 10*3/uL (ref 0.0–0.1)
Basophils Relative: 2 % — ABNORMAL HIGH (ref 0–1)
EOS ABS: 0 10*3/uL (ref 0.0–0.7)
Eosinophils Relative: 1 % (ref 0–5)
HEMATOCRIT: 33.7 % — AB (ref 39.0–52.0)
Hemoglobin: 11.7 g/dL — ABNORMAL LOW (ref 13.0–17.0)
LYMPHS ABS: 1.4 10*3/uL (ref 0.7–4.0)
LYMPHS PCT: 30 % (ref 12–46)
MCH: 31.5 pg (ref 26.0–34.0)
MCHC: 34.7 g/dL (ref 30.0–36.0)
MCV: 90.6 fL (ref 78.0–100.0)
MONOS PCT: 10 % (ref 3–12)
Monocytes Absolute: 0.5 10*3/uL (ref 0.1–1.0)
Neutro Abs: 2.7 10*3/uL (ref 1.7–7.7)
Neutrophils Relative %: 57 % (ref 43–77)
Platelets: 173 10*3/uL (ref 150–400)
RBC: 3.72 MIL/uL — ABNORMAL LOW (ref 4.22–5.81)
RDW: 15.4 % (ref 11.5–15.5)
WBC: 4.7 10*3/uL (ref 4.0–10.5)

## 2014-01-17 LAB — COMPLETE METABOLIC PANEL WITH GFR
ALBUMIN: 4.7 g/dL (ref 3.5–5.2)
ALT: 65 U/L — AB (ref 0–53)
AST: 114 U/L — ABNORMAL HIGH (ref 0–37)
Alkaline Phosphatase: 141 U/L — ABNORMAL HIGH (ref 39–117)
BILIRUBIN TOTAL: 0.5 mg/dL (ref 0.2–1.2)
BUN: 12 mg/dL (ref 6–23)
CO2: 23 meq/L (ref 19–32)
Calcium: 8.7 mg/dL (ref 8.4–10.5)
Chloride: 100 mEq/L (ref 96–112)
Creat: 0.94 mg/dL (ref 0.50–1.35)
GLUCOSE: 76 mg/dL (ref 70–99)
POTASSIUM: 4.2 meq/L (ref 3.5–5.3)
SODIUM: 137 meq/L (ref 135–145)
TOTAL PROTEIN: 8.2 g/dL (ref 6.0–8.3)

## 2014-01-17 MED ORDER — AMLODIPINE BESYLATE 5 MG PO TABS: 5.0000 mg | ORAL_TABLET | Freq: Every day | ORAL | Status: DC

## 2014-01-17 NOTE — Patient Instructions (Addendum)
F/u in November, call if you need me before  It is GOOD that you want top stop drinking, because you need to , so that you get healthy   Labs today, CBC, cmp and EGFR, hBA1C, TSH, PSA  Medication is sent in for your blood pressure.  You are referred to a specialist who will help you with safely stopping alcohol, make sure you get appt info and keep the appt please

## 2014-01-17 NOTE — Progress Notes (Signed)
   Subjective:    Patient ID: Stanley Jimenez, male    DOB: 1955/07/26, 58 y.o.   MRN: [id number]  HPI Pt in for help to stop drinking , wants medicaion to preven DT's . He has lost a significant amt of weight since I last saw him nearly 2 years ago, he is malnourished and looks older than stated age. He is intoxicated He denies fever or chills, states he juist "wants to get medication to prevent DT's , because he wants to stop drinking as it is killing him" Refuses flu vaccine   Review of Systems See HPI Denies recent fever or chills. Denies sinus pressure, nasal congestion, ear pain or sore throat. Denies chest congestion, productive cough or wheezing. Denies chest pains, palpitations and leg swelling Denies abdominal pain, nausea, vomiting,diarrhea or constipation.   Denies dysuria, frequency, hesitancy or incontinence. Denies joint pain, swelling and limitation in mobility. Denies headaches, seizures, numbness, or tingling. Denies skin break down or rash.        Objective:   Physical Exam BP 160/90  Pulse 84  Resp 16  Ht 5\' 9"  (1.753 m)  Wt 136 lb (61.689 kg)  BMI 20.07 kg/m2  SpO2 99% Patient alert and in no cardiopulmonary distress.cachectic, chronically ill appearing, appears older than stated age. Anxious  HEENT: No facial asymmetry, EOMI,   o.  Neck supple no JVD, no mass.  Chest: Clear to auscultation bilaterally.  CVS: S1, S2 no murmurs, no S3.Regular rate.  ABD: Soft non tender.   Ext: No edema  MS: Adequate ROM spine, shoulders, hips and knees.  Skin: Intact, no ulcerations or rash noted.  CNS: CN 2-12 intact, power,  normal throughout.no focal deficits noted.        Assessment & Plan:  HYPERTENSION Start amlodipine, medication sent in  Alcoholic Referred to local psychologist who treats alcoholics as out pt   ANXIETY STATE, UNSPECIFIED continued state of anxiety likley related to ETOH trying to withdraw  Alcoholic hepatitis Need to  re test for infectious cause as enzymes are still elevated, also hIV and RPR testing needed  Marijuana use Ongoing use , encouraged him to quit, he denies any other street drug use  Elevated PSA Needs urology eval will try and arrange , value is higher than in the past

## 2014-01-18 LAB — TSH: TSH: 0.611 u[IU]/mL (ref 0.350–4.500)

## 2014-01-18 LAB — PSA: PSA: 8.96 ng/mL — AB (ref <=4.00)

## 2014-01-20 NOTE — Assessment & Plan Note (Signed)
Start amlodipine, medication sent in

## 2014-01-20 NOTE — Assessment & Plan Note (Signed)
Ongoing use , encouraged him to quit, he denies any other street drug use

## 2014-01-20 NOTE — Assessment & Plan Note (Signed)
Referred to local psychologist who treats alcoholics as out pt

## 2014-01-20 NOTE — Assessment & Plan Note (Signed)
Need to re test for infectious cause as enzymes are still elevated, also hIV and RPR testing needed

## 2014-01-20 NOTE — Assessment & Plan Note (Signed)
Needs urology eval will try and arrange , value is higher than in the past

## 2014-01-20 NOTE — Assessment & Plan Note (Signed)
continued state of anxiety likley related to ETOH trying to withdraw

## 2014-01-21 LAB — RPR

## 2014-01-21 LAB — IRON: IRON: 137 ug/dL (ref 42–165)

## 2014-01-21 LAB — HIV ANTIBODY (ROUTINE TESTING W REFLEX): HIV 1&2 Ab, 4th Generation: NONREACTIVE

## 2014-01-22 LAB — HEPATITIS PANEL, ACUTE

## 2014-01-22 LAB — FERRITIN

## 2014-01-22 LAB — VITAMIN B12

## 2014-01-22 NOTE — Addendum Note (Signed)
Addended by: Denman George B on: 01/22/2014 09:00 AM   Modules accepted: Orders

## 2014-03-05 ENCOUNTER — Other Ambulatory Visit: Payer: Self-pay | Admitting: Urology

## 2014-03-05 ENCOUNTER — Ambulatory Visit (INDEPENDENT_AMBULATORY_CARE_PROVIDER_SITE_OTHER): Payer: Medicaid Other | Admitting: Urology

## 2014-03-05 DIAGNOSIS — R972 Elevated prostate specific antigen [PSA]: Secondary | ICD-10-CM

## 2014-03-07 ENCOUNTER — Other Ambulatory Visit: Payer: Self-pay | Admitting: Urology

## 2014-03-07 DIAGNOSIS — R972 Elevated prostate specific antigen [PSA]: Secondary | ICD-10-CM

## 2014-03-12 ENCOUNTER — Emergency Department (HOSPITAL_COMMUNITY)
Admission: EM | Admit: 2014-03-12 | Discharge: 2014-03-12 | Disposition: A | Discharge status: Home / Self Care | Payer: Medicaid Other | Attending: Emergency Medicine | Admitting: Emergency Medicine

## 2014-03-12 ENCOUNTER — Encounter (HOSPITAL_COMMUNITY): Payer: Self-pay | Admitting: Emergency Medicine

## 2014-03-12 DIAGNOSIS — E860 Dehydration: Secondary | ICD-10-CM | POA: Insufficient documentation

## 2014-03-12 DIAGNOSIS — R109 Unspecified abdominal pain: Secondary | ICD-10-CM | POA: Insufficient documentation

## 2014-03-12 DIAGNOSIS — F1010 Alcohol abuse, uncomplicated: Secondary | ICD-10-CM | POA: Insufficient documentation

## 2014-03-12 DIAGNOSIS — Z79899 Other long term (current) drug therapy: Secondary | ICD-10-CM | POA: Diagnosis not present

## 2014-03-12 DIAGNOSIS — I10 Essential (primary) hypertension: Secondary | ICD-10-CM | POA: Insufficient documentation

## 2014-03-12 DIAGNOSIS — Z872 Personal history of diseases of the skin and subcutaneous tissue: Secondary | ICD-10-CM | POA: Insufficient documentation

## 2014-03-12 DIAGNOSIS — Z8719 Personal history of other diseases of the digestive system: Secondary | ICD-10-CM | POA: Diagnosis not present

## 2014-03-12 DIAGNOSIS — Z8669 Personal history of other diseases of the nervous system and sense organs: Secondary | ICD-10-CM | POA: Diagnosis not present

## 2014-03-12 DIAGNOSIS — Z720 Tobacco use: Secondary | ICD-10-CM | POA: Insufficient documentation

## 2014-03-12 LAB — COMPREHENSIVE METABOLIC PANEL
ALT: 95 U/L — ABNORMAL HIGH (ref 0–53)
AST: 113 U/L — ABNORMAL HIGH (ref 0–37)
Albumin: 4.2 g/dL (ref 3.5–5.2)
Alkaline Phosphatase: 179 U/L — ABNORMAL HIGH (ref 39–117)
Anion gap: 19 — ABNORMAL HIGH (ref 5–15)
BUN: 10 mg/dL (ref 6–23)
CHLORIDE: 93 meq/L — AB (ref 96–112)
CO2: 26 mEq/L (ref 19–32)
Calcium: 10 mg/dL (ref 8.4–10.5)
Creatinine, Ser: 0.89 mg/dL (ref 0.50–1.35)
GFR calc Af Amer: >90 mL/min (ref >90)
Glucose, Bld: 68 mg/dL — ABNORMAL LOW (ref 70–99)
POTASSIUM: 4.1 meq/L (ref 3.7–5.3)
SODIUM: 138 meq/L (ref 137–147)
Total Bilirubin: 0.5 mg/dL (ref 0.3–1.2)
Total Protein: 8.8 g/dL — ABNORMAL HIGH (ref 6.0–8.3)

## 2014-03-12 LAB — CBC WITH DIFFERENTIAL/PLATELET
Basophils Absolute: 0.1 10*3/uL (ref 0.0–0.1)
Basophils Relative: 1 % (ref 0–1)
Eosinophils Absolute: 0 10*3/uL (ref 0.0–0.7)
Eosinophils Relative: 1 % (ref 0–5)
HEMATOCRIT: 37.2 % — AB (ref 39.0–52.0)
Hemoglobin: 13.1 g/dL (ref 13.0–17.0)
LYMPHS PCT: 17 % (ref 12–46)
Lymphs Abs: 1.2 10*3/uL (ref 0.7–4.0)
MCH: 33.4 pg (ref 26.0–34.0)
MCHC: 35.2 g/dL (ref 30.0–36.0)
MCV: 94.9 fL (ref 78.0–100.0)
Monocytes Absolute: 1.2 10*3/uL — ABNORMAL HIGH (ref 0.1–1.0)
Monocytes Relative: 16 % — ABNORMAL HIGH (ref 3–12)
NEUTROS ABS: 4.7 10*3/uL (ref 1.7–7.7)
Neutrophils Relative %: 65 % (ref 43–77)
PLATELETS: 235 10*3/uL (ref 150–400)
RBC: 3.92 MIL/uL — ABNORMAL LOW (ref 4.22–5.81)
RDW: 14.4 % (ref 11.5–15.5)
WBC: 7.2 10*3/uL (ref 4.0–10.5)

## 2014-03-12 LAB — RAPID URINE DRUG SCREEN, HOSP PERFORMED
AMPHETAMINES: NOT DETECTED
BARBITURATES: NOT DETECTED
Benzodiazepines: NOT DETECTED
Cocaine: NOT DETECTED
Opiates: NOT DETECTED
Tetrahydrocannabinol: NOT DETECTED

## 2014-03-12 LAB — ETHANOL: Alcohol, Ethyl (B): <11 mg/dL (ref 0–11)

## 2014-03-12 MED ORDER — AMLODIPINE BESYLATE 5 MG PO TABS: 5.0000 mg | ORAL_TABLET | Freq: Every day | ORAL | Status: DC

## 2014-03-12 MED ORDER — AMLODIPINE BESYLATE 5 MG PO TABS
5.0000 mg | ORAL_TABLET | Freq: Once | ORAL | Status: AC
  Administered 2014-03-12: 5 mg via ORAL
  Filled 2014-03-12: qty 1

## 2014-03-12 MED ORDER — LORAZEPAM 1 MG PO TABS
2.0000 mg | ORAL_TABLET | Freq: Once | ORAL | Status: AC
  Administered 2014-03-12: 2 mg via ORAL
  Filled 2014-03-12: qty 2

## 2014-03-12 MED ORDER — SODIUM CHLORIDE 0.9 % IV BOLUS (SEPSIS)
1000.0000 mL | Freq: Once | INTRAVENOUS | Status: AC
  Administered 2014-03-12: 1000 mL via INTRAVENOUS

## 2014-03-12 NOTE — ED Notes (Signed)
Called [facility] and advised them that Dr Christy Gentles stated patient will get a meal tray and 1000 ml normal saline bolus and be sent back to their facility. Spoke with Cherlynn Kaiser at [facility] and he verbalized understanding. Asked [place] to call him and leave a message when patient is discharged and they would work patient in today.

## 2014-03-12 NOTE — ED Notes (Signed)
Spoke with Malissa Hippo at [facility].  He stated patient's sister brought him to [facility] for an appointment for detox.  They noticed his blood pressure was extremely elevated and thought the patient needed to be medically cleared prior to finding placement for detox.  [facility] did a breathalizer test which resulted 0.110.

## 2014-03-12 NOTE — ED Notes (Signed)
Pt went to [facility] this am for appointment.  EMS stated pt had been drink last night and drunk last beer at midnight.  Pts etoh at [facility] was 0.11

## 2014-03-12 NOTE — ED Notes (Signed)
Emptied patient's urinal of 350 ml urine. Patient lying in bed with eyes closed at this time. Easily arouseable.

## 2014-03-12 NOTE — ED Notes (Signed)
Patient's sister arrived to transport patient to [facility] as requested by Cherlynn Kaiser at [facility].

## 2014-03-12 NOTE — ED Provider Notes (Signed)
CSN: [id number]     Arrival date & time 03/12/14  1001 History  This chart was scribed for Sharyon Cable, MD by Ludger Nutting, ED Scribe. This patient was seen in room APA17/APA17 and the patient's care was started 10:06 AM.    Chief Complaint  Patient presents with  . Hypertension   Patient is a 58 y.o. male presenting with hypertension. The history is provided by the patient. No language interpreter was used.  Hypertension This is a chronic problem. The current episode started 1 to 2 hours ago. The problem occurs constantly. The problem has not changed since onset.Associated symptoms include abdominal pain. Pertinent negatives include no chest pain, no headaches and no shortness of breath. Nothing aggravates the symptoms. Nothing relieves the symptoms. He has tried nothing for the symptoms. The treatment provided no relief.    HPI Comments: Stanley Jimenez is a 58 y.o. male who presents to the Emergency Department complaining of elevated blood pressure this morning. Patient states he was at [facility] for an appointment and was sent here due the high blood pressure. His pressure in the ED is 220/123. He states he also had LLQ pain but states this has resolved. Per nursing note, patient had blood alcohol level of 0.11. Patient states he drinks 2-3  daily and states his last drink was this morning. He reports having a prior seizure but has not in the last few days. He denies HA, visual disturbances, extremity weakness.   Past Medical History  Diagnosis Date  . Nicotine addiction   . Anxiety   . Depression   . Hypertension   . Alcohol abuse   . 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 Dermatitis 09/24/2010  . Anxiety state, unspecified 12/22/2007    Qualifier: Diagnosis of  By: Rebecca Eaton LPN, York Cerise    . Elevated PSA 12/29/2010  . Ventricular tachycardia, non-sustained 03/09/2012  . Alcoholic hepatitis 84/16/6063   History reviewed. No pertinent past surgical history. Family History  Problem Relation  Age of Onset  . Alcohol abuse Mother   . Alcohol abuse Sister   . Alcohol abuse Brother   . Alcohol abuse Brother   . Hypertension Sister    History  Substance Use Topics  . Smoking status: Current Every Day Smoker -- 0.50 packs/day    Types: Cigarettes  . Smokeless tobacco: Not on file  . Alcohol Use: Yes     Comment: quit since october 2013    Review of Systems  Constitutional: Negative for fever.  Respiratory: Negative for shortness of breath.   Cardiovascular: Negative for chest pain.  Gastrointestinal: Positive for abdominal pain.  Neurological: Positive for tremors. Negative for weakness and headaches.  Psychiatric/Behavioral: Negative for suicidal ideas.      Allergies  Review of patient's allergies indicates no known allergies.  Home Medications   Prior to Admission medications   Medication Sig Start Date End Date Taking? Authorizing Provider  amLODipine (NORVASC) 5 MG tablet Take 1 tablet (5 mg total) by mouth daily. 01/17/14   [name], MD   BP 187/105  Pulse 98  Temp(Src) 98.7 F (37.1 C) (Oral)  Resp 20  SpO2 98% Physical Exam  CONSTITUTIONAL: disheveled HEAD: Normocephalic/atraumatic EYES: EOMI/PERRL ENMT: Mucous membranes moist NECK: supple no meningeal signs SPINE:entire spine nontender CV: S1/S2 noted, no murmurs/rubs/gallops noted LUNGS: Lungs are clear to auscultation bilaterally, no apparent distress ABDOMEN: soft, nontender, no rebound or guarding GU:no cva tenderness NEURO: Pt is awake/alert, moves all extremitiesx4, upper extremity tremor noted  EXTREMITIES: pulses normal, full ROM SKIN: warm, color normal PSYCH: no abnormalities of mood noted  ED Course  Procedures  DIAGNOSTIC STUDIES: Oxygen Saturation is 100% on RA, normal by my interpretation.    COORDINATION OF CARE: 10:10 AM Discussed treatment plan with pt at bedside and pt agreed to plan.  pt went to [facility] for outpatient treatment of his ETOH abuse and there  was concern for his HTN Pt is in no distress, and he denies CP/HA/Weakness 11:32 AM BP is improved Pt is currently sleeping, no distress, no signs of significant ETOH withdrawal 12:13 PM Pt will be discharged medically stable to be evaluated at [facility]  BP 187/105  Pulse 98  Temp(Src) 98.7 F (37.1 C) (Oral)  Resp 20  SpO2 98%  Labs Review Labs Reviewed  CBC WITH DIFFERENTIAL - Abnormal; Notable for the following:    RBC 3.92 (*)    HCT 37.2 (*)    Monocytes Relative 16 (*)    Monocytes Absolute 1.2 (*)    All other components within normal limits  COMPREHENSIVE METABOLIC PANEL - Abnormal; Notable for the following:    Chloride 93 (*)    Glucose, Bld 68 (*)    Total Protein 8.8 (*)    AST 113 (*)    ALT 95 (*)    Alkaline Phosphatase 179 (*)    Anion gap 19 (*)    All other components within normal limits  URINE RAPID DRUG SCREEN (HOSP PERFORMED)  ETHANOL      MDM   Final diagnoses:  Essential hypertension  Alcohol abuse  Dehydration    Nursing notes including past medical history and social history reviewed and considered in documentation Labs/vital reviewed and considered Previous records reviewed and considered   I personally performed the services described in this documentation, which was scribed in my presence. The recorded information has been reviewed and is accurate.      Sharyon Cable, MD 03/12/14 [phone]

## 2014-03-12 NOTE — ED Notes (Signed)
Ordered meal tray as requested by Dr Christy Gentles.

## 2014-03-12 NOTE — ED Notes (Signed)
Patient being discharged to [facility]. Patient states his sister is supposed to be picking him up. Called sister's home number, no answer. Called cell number, wrong number. Called patient's brother, [name]. He stated he would attempt to call sister and tell her to pick up patient.

## 2014-03-26 ENCOUNTER — Inpatient Hospital Stay (HOSPITAL_COMMUNITY): Admission: RE | Admit: 2014-03-26 | Payer: Medicaid Other | Source: Ambulatory Visit

## 2014-04-11 ENCOUNTER — Encounter: Payer: Self-pay | Admitting: Family Medicine

## 2014-04-11 ENCOUNTER — Ambulatory Visit (INDEPENDENT_AMBULATORY_CARE_PROVIDER_SITE_OTHER): Payer: Medicaid Other | Admitting: Family Medicine

## 2014-04-11 VITALS — BP 148/82 | HR 94 | Resp 16 | Ht 69.0 in | Wt 144.0 lb

## 2014-04-11 DIAGNOSIS — R972 Elevated prostate specific antigen [PSA]: Secondary | ICD-10-CM

## 2014-04-11 DIAGNOSIS — F1020 Alcohol dependence, uncomplicated: Secondary | ICD-10-CM

## 2014-04-11 DIAGNOSIS — Z720 Tobacco use: Secondary | ICD-10-CM

## 2014-04-11 DIAGNOSIS — L404 Guttate psoriasis: Secondary | ICD-10-CM

## 2014-04-11 DIAGNOSIS — F17200 Nicotine dependence, unspecified, uncomplicated: Secondary | ICD-10-CM

## 2014-04-11 DIAGNOSIS — E785 Hyperlipidemia, unspecified: Secondary | ICD-10-CM

## 2014-04-11 DIAGNOSIS — I10 Essential (primary) hypertension: Secondary | ICD-10-CM

## 2014-04-11 MED ORDER — AMLODIPINE BESYLATE 10 MG PO TABS: ORAL_TABLET | ORAL | Status: DC

## 2014-04-11 NOTE — Progress Notes (Signed)
Subjective:    Patient ID: Stanley Jimenez, male    DOB: [birth year], 58 y.o.   MRN: [id number]  HPI  Pt in today after being in [facility] re[facility] for alcohol addiction in [place] for 21 days , from October to 11/09 He now goes to [employer] every day, and today he is on his way to mental health for treatment also Currently living with family, and his sister brought him to this visit and will take him to his mental health appt States he feels better, has improved appetite, and wants to work on smoking cessation also States drinking friends try to entice him, but he walks away. Will keep appt with urology for high pSA in December Currrently refusing flu vaccine Review of Systems See HPI Denies recent fever or chills. Denies sinus pressure, nasal congestion, ear pain or sore throat. Denies chest congestion, productive cough or wheezing. Denies chest pains, palpitations and leg swelling Denies abdominal pain, nausea, vomiting,diarrhea or constipation.   Denies dysuria, frequency, hesitancy or incontinence. Denies joint pain, swelling and limitation in mobility. Denies headaches, seizures, numbness, or tingling. Denies depression, uncontrolled anxiety or insomnia. Skin improved with topical steroid cream prescribed by dermatology        Objective:   Physical Exam BP 148/82 mmHg  Pulse 94  Resp 16  Ht 5\' 9"  (1.753 m)  Wt 144 lb (65.318 kg)  BMI 21.26 kg/m2  SpO2 100% Patient alert and oriented and in no cardiopulmonary distress.  HEENT: No facial asymmetry, EOMI,   oropharynx pink and moist.  Neck supple no JVD, no mass.  Chest: Clear to auscultation bilaterally.Decfreased though adequate air entry   CVS: S1, S2 no murmurs, no S3.Regular rate.  ABD: Soft non tender.   Ext: No edema  MS: Adequate ROM spine, shoulders, hips and knees.  Skin: Intact, no ulcerations or rash noted.  Psych: Good eye contact, normal affect. Memory intact not anxious or depressed  appearing.  CNS: CN 2-12 intact, power,  normal throughout.no focal deficits noted.        Assessment & Plan:  Essential hypertension Improved, still sub optimal control, however will hold off  On any further dose change at thsi time DASH diet and commitment to daily physical activity for a minimum of 30 minutes discussed and encouraged, as a part of hypertension management. The importance of attaining a healthy weight is also discussed.   Psoriasis, guttate Improved with use of topical steroid, continue same  Alcoholic Recently returned to community from 21 day in patient rehab in [place], attending [place] meetings and is on his way to mental health later today, seems highly motivated to remain sober. This is the first time he has been in  Treatment Applaude on this and encouraged to continue  Elevated PSA Has missed f/u with urology since he wa sin pt therapy but will be keeping appt in near future  NICOTINE ADDICTION Wants to quit and trying however currently major need is to remain alcohol free and he is encouraged in that regard He is counseled re gradual nicotine cessation  Patient counseled for approximately 5 minutes regarding the health risks of ongoing nicotine use, specifically all types of cancer, heart disease, stroke and respiratory failure. The options available for help with cessation ,the behavioral changes to assist the process, and the option to either gradully reduce usage  Or abruptly stop.is also discussed. Pt is also encouraged to set specific goals in number of cigarettes used daily, as well as to  set a quit date.

## 2014-04-11 NOTE — Patient Instructions (Addendum)
Annual physical with rectal end January, call if you need me before   CONGRATS on what you are doing to stay away from alcohol, continue daily AA meetings and mental health appts  BP is good, stay on same medication , we will refill   Hepatic panel and chem 7  in January, non fast  Cut back on cigarettes as able  Keep appt with prostate specialist next month, as you plan to  You look and are 200% Better!

## 2014-04-14 NOTE — Assessment & Plan Note (Signed)
Recently returned to community from 21 day in patient rehab in [place], attending [place] meetings and is on his way to mental health later today, seems highly motivated to remain sober. This is the first time he has been in  Treatment Applaude on this and encouraged to continue

## 2014-04-14 NOTE — Assessment & Plan Note (Signed)
Improved with use of topical steroid, continue same

## 2014-04-14 NOTE — Assessment & Plan Note (Signed)
Wants to quit and trying however currently major need is to remain alcohol free and he is encouraged in that regard He is counseled re gradual nicotine cessation  Patient counseled for approximately 5 minutes regarding the health risks of ongoing nicotine use, specifically all types of cancer, heart disease, stroke and respiratory failure. The options available for help with cessation ,the behavioral changes to assist the process, and the option to either gradully reduce usage  Or abruptly stop.is also discussed. Pt is also encouraged to set specific goals in number of cigarettes used daily, as well as to set a quit date.

## 2014-04-14 NOTE — Assessment & Plan Note (Signed)
Improved, still sub optimal control, however will hold off  On any further dose change at thsi time DASH diet and commitment to daily physical activity for a minimum of 30 minutes discussed and encouraged, as a part of hypertension management. The importance of attaining a healthy weight is also discussed.

## 2014-04-14 NOTE — Assessment & Plan Note (Signed)
Has missed f/u with urology since he wa sin pt therapy but will be keeping appt in near future

## 2014-04-23 ENCOUNTER — Ambulatory Visit (HOSPITAL_COMMUNITY)
Admission: RE | Admit: 2014-04-23 | Discharge: 2014-04-23 | Disposition: A | Discharge status: Home / Self Care | Payer: Medicaid Other | Source: Ambulatory Visit | Attending: Urology | Admitting: Urology

## 2014-04-23 DIAGNOSIS — R972 Elevated prostate specific antigen [PSA]: Secondary | ICD-10-CM | POA: Diagnosis not present

## 2014-04-23 MED ORDER — GENTAMICIN SULFATE 40 MG/ML IJ SOLN
INTRAMUSCULAR | Status: AC
  Administered 2014-04-23: 160 mg via INTRAMUSCULAR
  Filled 2014-04-23: qty 4

## 2014-04-23 MED ORDER — LIDOCAINE HCL (PF) 2 % IJ SOLN
10.0000 mL | Freq: Once | INTRAMUSCULAR | Status: AC
  Administered 2014-04-23: 10 mL

## 2014-04-23 MED ORDER — GENTAMICIN SULFATE 40 MG/ML IJ SOLN
160.0000 mg | Freq: Once | INTRAMUSCULAR | Status: AC
  Administered 2014-04-23: 160 mg via INTRAMUSCULAR

## 2014-04-23 MED ORDER — LIDOCAINE HCL (PF) 2 % IJ SOLN
INTRAMUSCULAR | Status: AC
  Administered 2014-04-23: 10 mL
  Filled 2014-04-23: qty 10

## 2014-04-23 NOTE — Discharge Instructions (Signed)
Transrectal Ultrasound-Guided Biopsy °A transrectal ultrasound-guided biopsy is a procedure to remove samples of tissue from your prostate using ultrasound images to guide the procedure. The procedure is usually done to evaluate the prostate gland of men who have an elevated prostate-specific antigen (PSA). PSA is a blood test to screen for prostate cancer. The biopsy samples are taken to check for prostate cancer.  °LET YOUR HEALTH CARE PROVIDER KNOW ABOUT: °· Any allergies you have. °· All medicines you are taking, including vitamins, herbs, eye drops, creams, and over-the-counter medicines. °· Previous problems you or members of your family have had with the use of anesthetics. °· Any blood disorders you have. °· Previous surgeries you have had. °· Medical conditions you have. °RISKS AND COMPLICATIONS °Generally, this is a safe procedure. However, as with any procedure, problems can occur. Possible problems include: °· Infection of your prostate. °· Bleeding from your rectum or blood in your urine. °· Difficulty urinating. °· Nerve damage (this is usually temporary). °· Damage to surrounding structures such as blood vessels, organs, and muscles, which would require other procedures. °BEFORE THE PROCEDURE °· Do not eat or drink anything after midnight on the night before the procedure or as directed by your health care provider. °· Take medicines only as directed by your health care provider. °· Your health care provider may have you stop taking certain medicines 5-7 days before the procedure. °· You will be given an enema before the procedure. During an enema, a liquid is injected into your rectum to clear out waste. °· You may have lab tests the day of your procedure.   °· Plan to have someone take you home after the procedure. °PROCEDURE  °· You will be given medicine to help you relax (sedative) before the procedure. An IV tube will be inserted into one of your veins and used to give fluids and  medicine. °· You will be given antibiotic medicine to reduce the risk of an infection. °· You will be placed on your side for the procedure. °· A probe with lubricated gel will be placed into your rectum, and images will be taken of your prostate and surrounding structures. °· Numbing medicine will be injected into the prostate before the biopsy samples are taken. °· A biopsy needle will then be inserted and guided to your prostate with the use of the ultrasound images. °· Samples of prostate tissue will be taken, and the needle will then be removed. °· The biopsy samples will be sent to a lab to be analyzed. Results are usually back in 2-3 days. °AFTER THE PROCEDURE °· You will be taken to a recovery area where you will be monitored. °· You may have some discomfort in the rectal area. You will be given pain medicines to control this. °· You may be allowed to go home the same day, or you may need to stay in the hospital overnight. °Document Released: 09/24/2013 Document Reviewed: 12/27/2012 °ExitCare® Patient Information ©2015 [employer]. This information is not intended to replace advice given to you by your health care provider. Make sure you discuss any questions you have with your health care provider. ° °

## 2014-05-07 ENCOUNTER — Institutional Professional Consult (permissible substitution) (INDEPENDENT_AMBULATORY_CARE_PROVIDER_SITE_OTHER): Payer: Medicaid Other | Admitting: Urology

## 2014-05-07 DIAGNOSIS — C61 Malignant neoplasm of prostate: Secondary | ICD-10-CM

## 2014-06-14 ENCOUNTER — Encounter: Payer: Medicaid Other | Admitting: Family Medicine

## 2014-06-28 ENCOUNTER — Ambulatory Visit (INDEPENDENT_AMBULATORY_CARE_PROVIDER_SITE_OTHER): Payer: Medicaid Other | Admitting: Urology

## 2014-06-28 DIAGNOSIS — C61 Malignant neoplasm of prostate: Secondary | ICD-10-CM

## 2014-07-08 ENCOUNTER — Ambulatory Visit: Payer: Medicaid Other | Admitting: Radiation Oncology

## 2014-07-31 ENCOUNTER — Telehealth: Payer: Self-pay | Admitting: *Deleted

## 2014-07-31 ENCOUNTER — Other Ambulatory Visit: Payer: Self-pay | Admitting: Radiation Oncology

## 2014-07-31 NOTE — Progress Notes (Signed)
  Radiation Oncology         [phone] ________________________________  Name: Stanley Jimenez MRN: [id number]  Date: 07/31/2014  DOB: Oct 10, 1955  Chart Note:  I saw this patient in consultation at the [facility] today.  For details, please request note at [phone].  The patient expressed interest in undergoing prostate seed implant.  I will coordinate that with our scheduler and my nurse.  All appointments in [place] should be coordinated through the patient's sister:  Lenn Cal [phone]  ________________________________  Sheral Apley. Tammi Klippel, M.D.

## 2014-07-31 NOTE — Telephone Encounter (Signed)
CALLED PATIENT TO INFORM OF PRE-SEED APPT. FOR 08-09-14 @ 10 AM , SPOKE WITH PATIENT AND HE IS AWARE OF THIS APPT.

## 2014-08-01 ENCOUNTER — Telehealth: Payer: Self-pay | Admitting: *Deleted

## 2014-08-01 ENCOUNTER — Other Ambulatory Visit: Payer: Self-pay | Admitting: Urology

## 2014-08-01 NOTE — Telephone Encounter (Signed)
CALLED PATIENT TO INFORM OF IMPLANT DATE, LVM FOR A RETURN CALL 

## 2014-08-02 ENCOUNTER — Other Ambulatory Visit: Payer: Self-pay | Admitting: Urology

## 2014-08-08 ENCOUNTER — Telehealth: Payer: Self-pay | Admitting: *Deleted

## 2014-08-08 NOTE — Telephone Encounter (Signed)
Called patient to remind of pre-seed appt. For 08-09-14, lvm for a return call

## 2014-08-09 ENCOUNTER — Ambulatory Visit: Payer: Medicaid Other | Admitting: Radiation Oncology

## 2014-08-14 ENCOUNTER — Telehealth: Payer: Self-pay | Admitting: *Deleted

## 2014-08-14 DIAGNOSIS — C61 Malignant neoplasm of prostate: Secondary | ICD-10-CM | POA: Insufficient documentation

## 2014-08-14 NOTE — Progress Notes (Signed)
  Radiation Oncology         [phone] ________________________________  Name: ATTIKUS BARTOSZEK MRN: [id number]  Date: 08/15/2014  DOB: 02-20-1956  SIMULATION AND TREATMENT PLANNING NOTE PUBIC ARCH STUDY  [id number] Moshe Cipro, MD  [name], MD  DIAGNOSIS: 59 yo man with T1c prostate cancer with Gleason 3+3 and PSA of 8.89     ICD-9-CM ICD-10-CM   1. Malignant neoplasm of prostate 185 C61     COMPLEX SIMULATION:  The patient presented today for evaluation for possible prostate seed implant. He was brought to the radiation planning suite and placed supine on the CT couch. A 3-dimensional image study set was obtained in upload to the planning computer. There, on each axial slice, I contoured the prostate gland. Then, using three-dimensional radiation planning tools I reconstructed the prostate in view of the structures from the transperineal needle pathway to assess for possible pubic arch interference. In doing so, I did not appreciate any pubic arch interference. Also, the patient's prostate volume was estimated based on the drawn structure. The volume was 27 cc.  Given the pubic arch appearance and prostate volume, patient remains a good candidate to proceed with prostate seed implant. Today, he freely provided informed written consent to proceed.    PLAN: The patient will undergo prostate seed implant.   ________________________________  Sheral Apley. Tammi Klippel, M.D.

## 2014-08-14 NOTE — Telephone Encounter (Signed)
Called patient to remind of appts. For 08-15-14, lvm for a return call

## 2014-08-15 ENCOUNTER — Other Ambulatory Visit: Payer: Self-pay

## 2014-08-15 ENCOUNTER — Encounter (HOSPITAL_BASED_OUTPATIENT_CLINIC_OR_DEPARTMENT_OTHER)
Admission: RE | Admit: 2014-08-15 | Discharge: 2014-08-15 | Disposition: A | Discharge status: Home / Self Care | Payer: Medicaid Other | Source: Ambulatory Visit | Attending: Urology | Admitting: Urology

## 2014-08-15 ENCOUNTER — Ambulatory Visit (HOSPITAL_COMMUNITY)
Admission: RE | Admit: 2014-08-15 | Discharge: 2014-08-15 | Disposition: A | Discharge status: Home / Self Care | Payer: Medicaid Other | Source: Ambulatory Visit | Attending: Urology | Admitting: Urology

## 2014-08-15 ENCOUNTER — Ambulatory Visit (HOSPITAL_BASED_OUTPATIENT_CLINIC_OR_DEPARTMENT_OTHER)
Admission: RE | Admit: 2014-08-15 | Discharge: 2014-08-15 | Disposition: A | Discharge status: Home / Self Care | Payer: Medicaid Other | Source: Ambulatory Visit | Attending: Radiation Oncology | Admitting: Radiation Oncology

## 2014-08-15 ENCOUNTER — Ambulatory Visit
Admission: RE | Admit: 2014-08-15 | Discharge: 2014-08-15 | Disposition: A | Discharge status: Home / Self Care | Payer: Medicaid Other | Source: Ambulatory Visit | Attending: Radiation Oncology | Admitting: Radiation Oncology

## 2014-08-15 DIAGNOSIS — C61 Malignant neoplasm of prostate: Secondary | ICD-10-CM | POA: Insufficient documentation

## 2014-08-15 DIAGNOSIS — Z87891 Personal history of nicotine dependence: Secondary | ICD-10-CM | POA: Diagnosis not present

## 2014-08-15 DIAGNOSIS — Z510 Encounter for antineoplastic radiation therapy: Secondary | ICD-10-CM | POA: Insufficient documentation

## 2014-08-15 DIAGNOSIS — Z01818 Encounter for other preprocedural examination: Secondary | ICD-10-CM | POA: Diagnosis not present

## 2014-09-12 ENCOUNTER — Telehealth: Payer: Self-pay | Admitting: *Deleted

## 2014-09-12 NOTE — Telephone Encounter (Signed)
CALLED PATIENT TO REMIND OF LABS FOR 09-13-14, SPOKE WITH PATIENT AND HE IS AWARE OF THIS APPT.

## 2014-09-13 DIAGNOSIS — F329 Major depressive disorder, single episode, unspecified: Secondary | ICD-10-CM | POA: Diagnosis not present

## 2014-09-13 DIAGNOSIS — M1990 Unspecified osteoarthritis, unspecified site: Secondary | ICD-10-CM | POA: Diagnosis not present

## 2014-09-13 DIAGNOSIS — I10 Essential (primary) hypertension: Secondary | ICD-10-CM | POA: Diagnosis not present

## 2014-09-13 DIAGNOSIS — E785 Hyperlipidemia, unspecified: Secondary | ICD-10-CM | POA: Diagnosis not present

## 2014-09-13 DIAGNOSIS — K7030 Alcoholic cirrhosis of liver without ascites: Secondary | ICD-10-CM | POA: Diagnosis not present

## 2014-09-13 DIAGNOSIS — C61 Malignant neoplasm of prostate: Secondary | ICD-10-CM | POA: Diagnosis present

## 2014-09-13 DIAGNOSIS — F1021 Alcohol dependence, in remission: Secondary | ICD-10-CM | POA: Diagnosis not present

## 2014-09-13 DIAGNOSIS — F419 Anxiety disorder, unspecified: Secondary | ICD-10-CM | POA: Diagnosis not present

## 2014-09-13 DIAGNOSIS — F17210 Nicotine dependence, cigarettes, uncomplicated: Secondary | ICD-10-CM | POA: Diagnosis not present

## 2014-09-13 LAB — COMPREHENSIVE METABOLIC PANEL
ALBUMIN: 4.6 g/dL (ref 3.5–5.2)
ALK PHOS: 75 U/L (ref 39–117)
ALT: 17 U/L (ref 0–53)
AST: 21 U/L (ref 0–37)
Anion gap: 7 (ref 5–15)
BILIRUBIN TOTAL: 0.6 mg/dL (ref 0.3–1.2)
BUN: 15 mg/dL (ref 6–23)
CALCIUM: 9.2 mg/dL (ref 8.4–10.5)
CO2: 25 mmol/L (ref 19–32)
Chloride: 104 mmol/L (ref 96–112)
Creatinine, Ser: 0.91 mg/dL (ref 0.50–1.35)
GFR calc Af Amer: >90 mL/min (ref >90)
GFR calc non Af Amer: >90 mL/min (ref >90)
GLUCOSE: 90 mg/dL (ref 70–99)
Potassium: 3.9 mmol/L (ref 3.5–5.1)
SODIUM: 136 mmol/L (ref 135–145)
Total Protein: 7.8 g/dL (ref 6.0–8.3)

## 2014-09-13 LAB — CBC
HCT: 39.2 % (ref 39.0–52.0)
Hemoglobin: 13.1 g/dL (ref 13.0–17.0)
MCH: 29.4 pg (ref 26.0–34.0)
MCHC: 33.4 g/dL (ref 30.0–36.0)
MCV: 87.9 fL (ref 78.0–100.0)
PLATELETS: 257 10*3/uL (ref 150–400)
RBC: 4.46 MIL/uL (ref 4.22–5.81)
RDW: 14.3 % (ref 11.5–15.5)
WBC: 8.3 10*3/uL (ref 4.0–10.5)

## 2014-09-13 LAB — PROTIME-INR
INR: 1.05 (ref 0.00–1.49)
PROTHROMBIN TIME: 13.9 s (ref 11.6–15.2)

## 2014-09-13 LAB — APTT: APTT: 32 s (ref 24–37)

## 2014-09-18 ENCOUNTER — Encounter (HOSPITAL_BASED_OUTPATIENT_CLINIC_OR_DEPARTMENT_OTHER): Payer: Self-pay | Admitting: *Deleted

## 2014-09-19 ENCOUNTER — Telehealth: Payer: Self-pay | Admitting: *Deleted

## 2014-09-19 ENCOUNTER — Encounter (HOSPITAL_BASED_OUTPATIENT_CLINIC_OR_DEPARTMENT_OTHER): Payer: Self-pay | Admitting: *Deleted

## 2014-09-19 NOTE — Telephone Encounter (Signed)
CALLED PATIENT TO REMIND OF PROCEDURE FOR 09-20-14, SPOKE WITH PATIENT AND HE IS AWARE OF THIS PROCEDURE.

## 2014-09-19 NOTE — Progress Notes (Signed)
NPO AFTER MN. ARRIVE AT 1000.  CURRENT LAB RESULTS, CXR, AND EKG IN CHART AND EPIC. WILL TAKE NORVASC AM DOS W/ SIPS OF WATER AND DO FLEET ENEMA.

## 2014-09-19 NOTE — H&P (Signed)
Urology History and Physical Exam  CC: Prostate cancer  HPI: 58 year old male presents at this time for I-125 brachytherapy as primary treatment of stage TIc adenocarcinoma of the prostate.  He underwent ultrasound and biopsy of his prostate on 04/23/2014.  This revealed 2 cores showing Gleason 3+3 equal 6 adenocarcinoma.  Prostatic volume was 18 mL.  He consulted with both Dr. Tyler Pita and Dr. Irine Seal regarding radiotherapy and surgical therapy, respectively.  He presents at this time for radiotherapy using I-125 brachytherapy.  PMH: Past Medical History  Diagnosis Date  . Depression   . Hypertension   . Alcohol abuse   . 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 Hyperlipidemia   . History of PSVT (paroxysmal supraventricular tachycardia)     run of non-sustatined VT 03-10-2015 in setting of alcohol withdrawal in hospital  . Cirrhosis, alcoholic   . Prostate cancer urologist-  dr dalhstedt/  oncologist- dr Tammi Klippel    T1c, Gleason 3+3,  PSA 8.89,  vol 27cc  . Clubbing of fingers     congenital  . History of thrombocytopenia     10/ 2013  in setting of alcohol withdrawal  . History of seizure     03-08-2012  alcohol withdrawal  . Anxiety     PSH: No past surgical history on file.  Allergies: No Known Allergies  Medications: No prescriptions prior to admission     Social History: History   Social History  . Marital Status: Single    Spouse Name: N/A  . Number of Children: N/A  . Years of Education: N/A   Occupational History  . Not on file.   Social History Main Topics  . Smoking status: Current Every Day Smoker -- 0.50 packs/day    Types: Cigarettes  . Smokeless tobacco: Not on file  . Alcohol Use: Yes     Comment: (approx. 40oz beer daily)  Alcoholic -- hx Inpt Rehab 21 day in [place] 10/ 2015  . Drug Use: Yes    Special: Marijuana     Comment: last use october 2013  . Sexual Activity: Not on file   Other Topics Concern  . Not on file   Social History  Narrative    Family History: Family History  Problem Relation Age of Onset  . Alcohol abuse Mother   . Alcohol abuse Sister   . Alcohol abuse Brother   . Alcohol abuse Brother   . Hypertension Sister     Review of Systems: Positive: N/A Negative:   A further 10 point review of systems was negative except what is listed in the HPI.                  Physical Exam: @VITALS2 @ General: No acute distress.  Awake. Head:  Normocephalic.  Atraumatic. ENT:  EOMI.  Mucous membranes moist Neck:  Supple.  No lymphadenopathy. CV:  S1 present. S2 present. Regular rate. Pulmonary: Equal effort bilaterally.  Clear to auscultation bilaterally. Abdomen: Soft.  Non tender to palpation. Skin:  Normal turgor.  No visible rash. Extremity: No gross deformity of bilateral upper extremities.  No gross deformity of                             lower extremities. Neurologic: Alert. Appropriate mood. Genitourinary: The penis is uncircumcised. The scrotum is normal in appearance. The right vas deferens is is palpably normal. The left vas deferens is palpably normal. The right epididymis is  palpably normal. The left epididymis is palpably normal. The right spermatic cord is palpably normal. The right testis is palpably normal. The left testis is normal.  Lymphatics: The femoral and inguinal nodes are not enlarged or tender.  Skin: Normal skin turgor . He had a generalized psoriatic rash.  Neuro/Psych:. Mood and affect are appropriate.     Studies:  No results for input(s): HGB, WBC, PLT in the last 72 hours.  No results for input(s): NA, K, CL, CO2, BUN, CREATININE, CALCIUM, GFRNONAA, GFRAA in the last 72 hours.  Invalid input(s): MAGNESIUM   No results for input(s): INR, APTT in the last 72 hours.  Invalid input(s): PT   Invalid input(s): ABG    Assessment:  Stage TIc adenocarcinoma of the prostate, Gleason 3+3 = 6 in 2/12 cores.  Atypia was present as well.  He has an 18 mL prostate with a PSA  density of 0.49  Plan: I-125 brachytherapy

## 2014-09-20 ENCOUNTER — Ambulatory Visit (HOSPITAL_BASED_OUTPATIENT_CLINIC_OR_DEPARTMENT_OTHER)
Admission: RE | Admit: 2014-09-20 | Discharge: 2014-09-20 | Disposition: A | Discharge status: Home / Self Care | Payer: Medicaid Other | Source: Ambulatory Visit | Attending: Urology | Admitting: Urology

## 2014-09-20 ENCOUNTER — Encounter (HOSPITAL_BASED_OUTPATIENT_CLINIC_OR_DEPARTMENT_OTHER): Payer: Self-pay | Admitting: *Deleted

## 2014-09-20 ENCOUNTER — Ambulatory Visit (HOSPITAL_BASED_OUTPATIENT_CLINIC_OR_DEPARTMENT_OTHER): Payer: Medicaid Other | Admitting: Anesthesiology

## 2014-09-20 ENCOUNTER — Encounter (HOSPITAL_BASED_OUTPATIENT_CLINIC_OR_DEPARTMENT_OTHER)
Admission: RE | Disposition: A | Discharge status: Home / Self Care | Payer: Self-pay | Source: Ambulatory Visit | Attending: Urology

## 2014-09-20 ENCOUNTER — Ambulatory Visit (HOSPITAL_COMMUNITY): Payer: Medicaid Other

## 2014-09-20 DIAGNOSIS — M1990 Unspecified osteoarthritis, unspecified site: Secondary | ICD-10-CM | POA: Insufficient documentation

## 2014-09-20 DIAGNOSIS — F1021 Alcohol dependence, in remission: Secondary | ICD-10-CM | POA: Insufficient documentation

## 2014-09-20 DIAGNOSIS — F329 Major depressive disorder, single episode, unspecified: Secondary | ICD-10-CM | POA: Insufficient documentation

## 2014-09-20 DIAGNOSIS — C61 Malignant neoplasm of prostate: Secondary | ICD-10-CM | POA: Diagnosis not present

## 2014-09-20 DIAGNOSIS — I10 Essential (primary) hypertension: Secondary | ICD-10-CM | POA: Insufficient documentation

## 2014-09-20 DIAGNOSIS — E785 Hyperlipidemia, unspecified: Secondary | ICD-10-CM | POA: Insufficient documentation

## 2014-09-20 DIAGNOSIS — Z01818 Encounter for other preprocedural examination: Secondary | ICD-10-CM

## 2014-09-20 DIAGNOSIS — F17210 Nicotine dependence, cigarettes, uncomplicated: Secondary | ICD-10-CM | POA: Diagnosis not present

## 2014-09-20 DIAGNOSIS — F419 Anxiety disorder, unspecified: Secondary | ICD-10-CM | POA: Insufficient documentation

## 2014-09-20 DIAGNOSIS — K7030 Alcoholic cirrhosis of liver without ascites: Secondary | ICD-10-CM | POA: Insufficient documentation

## 2014-09-20 HISTORY — DX: Alcoholic cirrhosis of liver without ascites: K70.30

## 2014-09-20 HISTORY — DX: Unspecified osteoarthritis, unspecified site: M19.90

## 2014-09-20 HISTORY — DX: Complete loss of teeth, unspecified cause, unspecified class: K08.109

## 2014-09-20 HISTORY — DX: Alcohol abuse, in remission: F10.11

## 2014-09-20 HISTORY — DX: Hyperlipidemia, unspecified: E78.5

## 2014-09-20 HISTORY — DX: Malignant neoplasm of prostate: C61

## 2014-09-20 HISTORY — PX: RADIOACTIVE SEED IMPLANT: SHX5150

## 2014-09-20 HISTORY — DX: Personal history of other specified conditions: Z87.898

## 2014-09-20 HISTORY — DX: Presence of dental prosthetic device (complete) (partial): Z97.2

## 2014-09-20 HISTORY — DX: Personal history of other diseases of the circulatory system: Z86.79

## 2014-09-20 HISTORY — DX: Clubbing of fingers: R68.3

## 2014-09-20 HISTORY — DX: Personal history of diseases of the blood and blood-forming organs and certain disorders involving the immune mechanism: Z86.2

## 2014-09-20 SURGERY — INSERTION, RADIATION SOURCE, PROSTATE
Anesthesia: General | Site: Prostate

## 2014-09-20 MED ORDER — STERILE WATER FOR IRRIGATION IR SOLN
Status: DC | PRN
  Administered 2014-09-20: 3000 mL

## 2014-09-20 MED ORDER — GLYCOPYRROLATE 0.2 MG/ML IJ SOLN
INTRAMUSCULAR | Status: DC | PRN
  Administered 2014-09-20: 0.4 mg via INTRAVENOUS

## 2014-09-20 MED ORDER — DEXAMETHASONE SODIUM PHOSPHATE 4 MG/ML IJ SOLN
INTRAMUSCULAR | Status: DC | PRN
  Administered 2014-09-20: 10 mg via INTRAVENOUS

## 2014-09-20 MED ORDER — GLYCOPYRROLATE 0.2 MG/ML IJ SOLN: INTRAMUSCULAR | Status: DC | PRN

## 2014-09-20 MED ORDER — MIDAZOLAM HCL 2 MG/2ML IJ SOLN
INTRAMUSCULAR | Status: AC
  Filled 2014-09-20: qty 2

## 2014-09-20 MED ORDER — STERILE WATER FOR IRRIGATION IR SOLN
Status: DC | PRN
  Administered 2014-09-20: 500 mL

## 2014-09-20 MED ORDER — LACTATED RINGERS IV SOLN
INTRAVENOUS | Status: DC
  Administered 2014-09-20 (×2): via INTRAVENOUS
  Filled 2014-09-20: qty 1000

## 2014-09-20 MED ORDER — CIPROFLOXACIN IN D5W 400 MG/200ML IV SOLN
INTRAVENOUS | Status: AC
  Filled 2014-09-20: qty 200

## 2014-09-20 MED ORDER — ROCURONIUM BROMIDE 100 MG/10ML IV SOLN
INTRAVENOUS | Status: DC | PRN
  Administered 2014-09-20: 40 mg via INTRAVENOUS

## 2014-09-20 MED ORDER — HYDROCODONE-ACETAMINOPHEN 5-325 MG PO TABS: 1.0000 | ORAL_TABLET | ORAL | Status: DC | PRN

## 2014-09-20 MED ORDER — PROPOFOL 10 MG/ML IV BOLUS
INTRAVENOUS | Status: DC | PRN
  Administered 2014-09-20: 150 mg via INTRAVENOUS

## 2014-09-20 MED ORDER — NEOSTIGMINE METHYLSULFATE 10 MG/10ML IV SOLN
INTRAVENOUS | Status: DC | PRN
  Administered 2014-09-20: 3 mg via INTRAVENOUS

## 2014-09-20 MED ORDER — FLEET ENEMA 7-19 GM/118ML RE ENEM
1.0000 | ENEMA | Freq: Once | RECTAL | Status: DC
  Filled 2014-09-20: qty 1

## 2014-09-20 MED ORDER — IOHEXOL 350 MG/ML SOLN
INTRAVENOUS | Status: DC | PRN
  Administered 2014-09-20: 7 mL

## 2014-09-20 MED ORDER — CIPROFLOXACIN HCL 250 MG PO TABS: 250.0000 mg | ORAL_TABLET | Freq: Two times a day (BID) | ORAL | Status: DC

## 2014-09-20 MED ORDER — MIDAZOLAM HCL 5 MG/5ML IJ SOLN
INTRAMUSCULAR | Status: DC | PRN
  Administered 2014-09-20 (×2): 2 mg via INTRAVENOUS

## 2014-09-20 MED ORDER — ONDANSETRON HCL 4 MG/2ML IJ SOLN
INTRAMUSCULAR | Status: DC | PRN
  Administered 2014-09-20: 4 mg via INTRAVENOUS

## 2014-09-20 MED ORDER — CIPROFLOXACIN IN D5W 400 MG/200ML IV SOLN
400.0000 mg | INTRAVENOUS | Status: AC
  Administered 2014-09-20: 400 mg via INTRAVENOUS
  Filled 2014-09-20: qty 200

## 2014-09-20 MED ORDER — ACETAMINOPHEN 10 MG/ML IV SOLN
INTRAVENOUS | Status: DC | PRN
  Administered 2014-09-20: 1000 mg via INTRAVENOUS

## 2014-09-20 MED ORDER — PHENYLEPHRINE HCL 10 MG/ML IJ SOLN
INTRAMUSCULAR | Status: DC | PRN
  Administered 2014-09-20 (×2): 80 ug via INTRAVENOUS

## 2014-09-20 MED ORDER — FENTANYL CITRATE (PF) 100 MCG/2ML IJ SOLN
INTRAMUSCULAR | Status: AC
  Filled 2014-09-20: qty 4

## 2014-09-20 MED ORDER — FENTANYL CITRATE (PF) 100 MCG/2ML IJ SOLN
INTRAMUSCULAR | Status: DC | PRN
  Administered 2014-09-20: 100 ug via INTRAVENOUS

## 2014-09-20 MED ORDER — LIDOCAINE HCL (CARDIAC) 20 MG/ML IV SOLN
INTRAVENOUS | Status: DC | PRN
  Administered 2014-09-20: 70 mg via INTRAVENOUS

## 2014-09-20 SURGICAL SUPPLY — 29 items
BAG URINE DRAINAGE (UROLOGICAL SUPPLIES) ×3 IMPLANT
BLADE CLIPPER SURG (BLADE) ×3 IMPLANT
CATH FOLEY 2WAY SLVR  5CC 16FR (CATHETERS) ×2
CATH FOLEY 2WAY SLVR 5CC 16FR (CATHETERS) ×1 IMPLANT
CATH ROBINSON RED A/P 20FR (CATHETERS) ×3 IMPLANT
CLOTH BEACON ORANGE TIMEOUT ST (SAFETY) ×3 IMPLANT
COVER BACK TABLE 60X90IN (DRAPES) IMPLANT
COVER MAYO STAND STRL (DRAPES) ×3 IMPLANT
DRSG TEGADERM 4X4.75 (GAUZE/BANDAGES/DRESSINGS) ×3 IMPLANT
DRSG TEGADERM 8X12 (GAUZE/BANDAGES/DRESSINGS) ×3 IMPLANT
GLOVE BIO SURGEON STRL SZ 6.5 (GLOVE) ×2 IMPLANT
GLOVE BIO SURGEON STRL SZ7.5 (GLOVE) ×3 IMPLANT
GLOVE BIO SURGEON STRL SZ8 (GLOVE) ×6 IMPLANT
GLOVE BIO SURGEONS STRL SZ 6.5 (GLOVE) ×1
GLOVE BIOGEL PI IND STRL 6.5 (GLOVE) ×2 IMPLANT
GLOVE BIOGEL PI INDICATOR 6.5 (GLOVE) ×4
GLOVE ECLIPSE 8.0 STRL XLNG CF (GLOVE) ×15 IMPLANT
GOWN STRL REUS W/ TWL LRG LVL3 (GOWN DISPOSABLE) ×1 IMPLANT
GOWN STRL REUS W/ TWL XL LVL3 (GOWN DISPOSABLE) IMPLANT
GOWN STRL REUS W/TWL LRG LVL3 (GOWN DISPOSABLE) ×5 IMPLANT
GOWN STRL REUS W/TWL XL LVL3 (GOWN DISPOSABLE) ×3 IMPLANT
HOLDER FOLEY CATH W/STRAP (MISCELLANEOUS) ×3 IMPLANT
PACK CYSTO (CUSTOM PROCEDURE TRAY) ×3 IMPLANT
SPONGE GAUZE 4X4 12PLY STER LF (GAUZE/BANDAGES/DRESSINGS) ×3 IMPLANT
SYRINGE 10CC LL (SYRINGE) ×3 IMPLANT
UNDERPAD 30X30 INCONTINENT (UNDERPADS AND DIAPERS) ×6 IMPLANT
WATER STERILE IRR 3000ML UROMA (IV SOLUTION) ×3 IMPLANT
WATER STERILE IRR 500ML POUR (IV SOLUTION) ×3 IMPLANT
nucletron selectseed ×231 IMPLANT

## 2014-09-20 NOTE — Anesthesia Preprocedure Evaluation (Addendum)
Anesthesia Evaluation  Patient identified by MRN, date of birth, ID band Patient awake    Reviewed: Allergy & Precautions, NPO status , Patient's Chart, lab work & pertinent test results  Airway Mallampati: II  TM Distance: >3 FB Neck ROM: full    Dental  (+) Lower Dentures, Upper Dentures, Dental Advisory Given   Pulmonary Current Smoker,  breath sounds clear to auscultation  Pulmonary exam normal       Cardiovascular Exercise Tolerance: Good hypertension, Pt. on medications + dysrhythmias Supra Ventricular Tachycardia Rhythm:regular Rate:Normal     Neuro/Psych Anxiety Depression    GI/Hepatic (+) Cirrhosis -    substance abuse  alcohol use, Pt went to rehab and has had no alcohol for "6 months"   Endo/Other    Renal/GU      Musculoskeletal  (+) Arthritis -, Osteoarthritis,    Abdominal   Peds  Hematology   Anesthesia Other Findings   Reproductive/Obstetrics                         Anesthesia Physical Anesthesia Plan  ASA: II  Anesthesia Plan: General   Post-op Pain Management:    Induction: Intravenous  Airway Management Planned: Oral ETT  Additional Equipment:   Intra-op Plan:   Post-operative Plan: Extubation in OR  Informed Consent: I have reviewed the patients History and Physical, chart, labs and discussed the procedure including the risks, benefits and alternatives for the proposed anesthesia with the patient or authorized representative who has indicated his/her understanding and acceptance.   Dental advisory given  Plan Discussed with: CRNA, Anesthesiologist and Surgeon  Anesthesia Plan Comments:        Anesthesia Quick Evaluation

## 2014-09-20 NOTE — Progress Notes (Signed)
  Radiation Oncology         [phone]) [phone] ________________________________  Name: Stanley Jimenez MRN: [id number]  Date: 09/20/2014  DOB: 1955/12/24       Prostate Seed Implant  NU:UVOZDGUY Moshe Cipro, MD  No ref. provider found  DIAGNOSIS: 59 yo man with T1c prostate cancer with Gleason 3+3 and PSA of 8.89     ICD-9-CM ICD-10-CM   1. Pre-op testing V72.84 Z01.818 DG Chest 2 View     DG Chest 2 View     Discharge patient    PROCEDURE: Insertion of radioactive I-125 seeds into the prostate gland.  RADIATION DOSE: 145 Gy, definitive therapy.  TECHNIQUE: Stanley Jimenez was brought to the operating room with the urologist. He was placed in the dorsolithotomy position. He was catheterized and a rectal tube was inserted. The perineum was shaved, prepped and draped. The ultrasound probe was then introduced into the rectum to see the prostate gland.  TREATMENT DEVICE: A needle grid was attached to the ultrasound probe stand and anchor needles were placed.  3D PLANNING: The prostate was imaged in 3D using a sagittal sweep of the prostate probe. These images were transferred to the planning computer. There, the prostate, urethra and rectum were defined on each axial reconstructed image. Then, the software created an optimized 3D plan and a few seed positions were adjusted. The quality of the plan was reviewed using [facility] information for the target and the following two organs at risk:  Urethra and Rectum.  Then the accepted plan was uploaded to the seed Selectron afterloading unit.  PROSTATE VOLUME STUDY:  Using transrectal ultrasound the volume of the prostate was verified to be 33.7 cc.  SPECIAL TREATMENT PROCEDURE/SUPERVISION AND HANDLING: The Nucletron FIRST system was used to place the needles under sagittal guidance. A total of 23 needles were used to deposit 77 seeds in the prostate gland. The individual seed activity was 0.358 mCi for a total implant activity of 27.566 mCi.  COMPLEX  SIMULATION: At the end of the procedure, an anterior radiograph of the pelvis was obtained to document seed positioning and count. Cystoscopy was performed to check the urethra and bladder.  MICRODOSIMETRY: At the end of the procedure, the patient was emitting 0.13 mrem/hr at 1 meter. Accordingly, he was considered safe for hospital discharge.  PLAN: The patient will return to the radiation oncology clinic for post implant CT dosimetry in three weeks.   ________________________________  Sheral Apley Tammi Klippel, M.D.

## 2014-09-20 NOTE — Discharge Instructions (Signed)
Radioactive Seed Implant Home Care Instructions ° ° °Activity:    Rest for the remainder of the day.  Do not drive or operate equipment today.  You may resume normal  activities in a few days as instructed by your physician, without risk of harmful radiation exposure to those around you, provided you follow the time and distance precautions on the Radiation Oncology Instruction Sheet. ° ° °Meals: Drink plenty of lipuids and eat light foods, such as gelatin or soup this evening .  You may return to normal meal plan tomorrow. ° °Return °To Work: You may return to work as instructed by your physician. ° °Special °Instruction:   If any seeds are found, use tweezers to pick up seeds and place in a glass container of any kind and bring to your physician's office. ° °Call your physician if any of these symptoms occur: ° °· Persistent or heavy bleeding °· Urine stream diminishes or stops completely after catheter is removed °· Fever equal to or greater than 101 degrees F °· Cloudy urine with a strong foul odor °· Severe pain ° °You may feel some burning pain and/or hesitancy when you urinate after the catheter is removed.  These symptoms may increase over the next few weeks, but should diminish within forur to six weeks.  Applying moist heat to the lower abdomen or a hot tub bath may help relieve the pain.  If the discomfort becomes severe, please call your physician for additional medications. ° ° °Post Anesthesia Home Care Instructions ° °Activity: °Get plenty of rest for the remainder of the day. A responsible adult should stay with you for 24 hours following the procedure.  °For the next 24 hours, DO NOT: °-Drive a car °-Operate machinery °-Drink alcoholic beverages °-Take any medication unless instructed by your physician °-Make any legal decisions or sign important papers. ° °Meals: °Start with liquid foods such as gelatin or soup. Progress to regular foods as tolerated. Avoid greasy, spicy, heavy foods. If nausea  and/or vomiting occur, drink only clear liquids until the nausea and/or vomiting subsides. Call your physician if vomiting continues. ° °Special Instructions/Symptoms: °Your throat may feel dry or sore from the anesthesia or the breathing tube placed in your throat during surgery. If this causes discomfort, gargle with warm salt water. The discomfort should disappear within 24 hours. ° °If you had a scopolamine patch placed behind your ear for the management of post- operative nausea and/or vomiting: ° °1. The medication in the patch is effective for 72 hours, after which it should be removed.  Wrap patch in a tissue and discard in the trash. Wash hands thoroughly with soap and water. °2. You may remove the patch earlier than 72 hours if you experience unpleasant side effects which may include dry mouth, dizziness or visual disturbances. °3. Avoid touching the patch. Wash your hands with soap and water after contact with the patch. °  ° ° ° °

## 2014-09-20 NOTE — Transfer of Care (Signed)
Immediate Anesthesia Transfer of Care Note  Patient: Stanley Jimenez  Procedure(s) Performed: Procedure(s) with comments: RADIOACTIVE SEED IMPLANT (N/A) -   48   seeds implanted no seeds found in bladder  Patient Location: PACU  Anesthesia Type:General  Level of Consciousness: awake, alert , oriented and patient cooperative  Airway & Oxygen Therapy: Patient Spontanous Breathing and Patient connected to nasal cannula oxygen  Post-op Assessment: Report given to RN and Post -op Vital signs reviewed and stable  Post vital signs: Reviewed and stable  Last Vitals:  Filed Vitals:   09/20/14 1018  BP: 147/87  Pulse: 74  Temp: 36.5 C  Resp: 12    Complications: No apparent anesthesia complications

## 2014-09-20 NOTE — Anesthesia Procedure Notes (Addendum)
Procedure Name: Intubation Date/Time: 09/20/2014 11:42 AM Performed by: Wanita Chamberlain Pre-anesthesia Checklist: Patient identified, Timeout performed, Emergency Drugs available, Suction available and Patient being monitored Patient Re-evaluated:Patient Re-evaluated prior to inductionOxygen Delivery Method: Circle system utilized Preoxygenation: Pre-oxygenation with 100% oxygen Intubation Type: IV induction Ventilation: Mask ventilation without difficulty Laryngoscope Size: Mac and 3 Grade View: Grade I Tube type: Oral Tube size: 8.0 mm Number of attempts: 1 Airway Equipment and Method: Stylet Placement Confirmation: breath sounds checked- equal and bilateral,  positive ETCO2 and ETT inserted through vocal cords under direct vision Secured at: 21 cm Tube secured with: Tape Dental Injury: Teeth and Oropharynx as per pre-operative assessment

## 2014-09-20 NOTE — Op Note (Signed)
Preoperative diagnosis: Clinical stage TI C adenocarcinoma the prostate   Postoperative diagnosis: Same   Procedure: I-125 prostate seed implantation with Nucletron robotic implanter   Surgeon: Lillette Boxer. Arnola Crittendon M.D.  Radiation Oncologist:Manning  Anesthesia: Gen.   Indications: Patient  was diagnosed with clinical stage TI C prostate cancer. We had extensive discussion with him about treatment options versus. He elected to proceed with seed implantation. He underwent consultation my office as well as with Dr. Tammi Klippel. He appeared to understand the advantages disadvantages potential risks of this treatment option. Full informed consent has been obtained.   Technique and findings: Patient was brought the operating room where he had successful induction of general anesthesia. He was placed in dorso-lithotomy position and prepped and draped in usual manner. Appropriate surgical timeout was performed. Radiation oncology department placed a transrectal ultrasound probe anchoring stand. Foley catheter with contrast in the balloon was inserted without difficulty. Anchoring needles were placed within the prostate. Rectal tube was placed. Real-time contouring of the urethra prostate and rectum were performed and the dosing parameters were established. Targeted dose was 145 gray.  I was then called  to the operating suite suite for placement of the needles. A second timeout was performed. All needle passage was done with real-time transrectal ultrasound guidance with the sagittal plane. A total of 23 needles were placed. The implantation itself was done with the robotic implanter. 77 active seeds were implanted. The Foley catheter was removed and flexible cystoscopy failed to show any seeds outside the prostate.  The patient was brought to recovery room in stable condition, having tolerated the procedure well.Marland Kitchen

## 2014-09-21 NOTE — Anesthesia Postprocedure Evaluation (Signed)
Anesthesia Post Note  Patient: Stanley Jimenez  Procedure(s) Performed: Procedure(s) (LRB): RADIOACTIVE SEED IMPLANT (N/A)  Anesthesia type: General  Patient location: PACU  Post pain: Pain level controlled and Adequate analgesia  Post assessment: Post-op Vital signs reviewed, Patient's Cardiovascular Status Stable, Respiratory Function Stable, Patent Airway and Pain level controlled  Last Vitals:  Filed Vitals:   09/20/14 1420  BP: 133/81  Pulse: 63  Temp: 36.5 C  Resp: 18    Post vital signs: Reviewed and stable  Level of consciousness: awake, alert  and oriented  Complications: No apparent anesthesia complications

## 2014-09-23 ENCOUNTER — Encounter (HOSPITAL_BASED_OUTPATIENT_CLINIC_OR_DEPARTMENT_OTHER): Payer: Self-pay | Admitting: Urology

## 2014-09-25 ENCOUNTER — Encounter: Payer: Self-pay | Admitting: Family Medicine

## 2014-09-25 ENCOUNTER — Ambulatory Visit (INDEPENDENT_AMBULATORY_CARE_PROVIDER_SITE_OTHER): Payer: Medicaid Other | Admitting: Family Medicine

## 2014-09-25 VITALS — BP 138/82 | HR 90 | Resp 18 | Ht 69.0 in | Wt 153.0 lb

## 2014-09-25 DIAGNOSIS — E785 Hyperlipidemia, unspecified: Secondary | ICD-10-CM | POA: Diagnosis not present

## 2014-09-25 DIAGNOSIS — I10 Essential (primary) hypertension: Secondary | ICD-10-CM

## 2014-09-25 DIAGNOSIS — Z1211 Encounter for screening for malignant neoplasm of colon: Secondary | ICD-10-CM | POA: Diagnosis not present

## 2014-09-25 DIAGNOSIS — Z0000 Encounter for general adult medical examination without abnormal findings: Secondary | ICD-10-CM | POA: Insufficient documentation

## 2014-09-25 LAB — HEMOCCULT GUIAC POC 1CARD (OFFICE): Fecal Occult Blood, POC: NEGATIVE

## 2014-09-25 NOTE — Patient Instructions (Signed)
F/u in 4.5 month, call if you need me before  STAY AWAY from alcohol and keep going to AA meetings Please  Cut back on cigarettes , you need to quit!  Lipid, cmp and EGFR fasting in 4 months   

## 2014-09-29 NOTE — Assessment & Plan Note (Signed)
Annual exam as documented. Counseling done  re healthy lifestyle involving commitment to 150 minutes exercise per week, heart healthy diet, .The importance of adequate sleep also discussed. Regular seat belt use and home safety, is also discussed. Changes in health habits are decided on by the patient with goals and time frames  set for achieving them.Main issue of alcohol and niccotine addiction is discussed and cessation is encouraged. Pt in denial re alcohol use. Immunization and cancer screening needs are specifically addressed at this visit.

## 2014-09-29 NOTE — Progress Notes (Signed)
   Subjective:    Patient ID: Stanley Jimenez, male    DOB: January 29, 1956, 59 y.o.   MRN: [id number]  HPI Patient is in for annual physical exam. Immunization is reviewed , and  updated if needed. No other health concerns are expressed   Review of Systems See HPI     Objective:   Physical Exam BP 138/82 mmHg  Pulse 90  Resp 18  Ht 5\' 9"  (1.753 m)  Wt 153 lb (69.4 kg)  BMI 22.58 kg/m2  SpO2 98% Pleasant well nourished male, alert and oriented x 3, in no cardio-pulmonary distress. Afebrile. HEENT No facial trauma or asymetry. Sinuses non tender. EOMI, PERTL. External ears normal, tympanic membranes clear. Oropharynx moist, no exudate, poor dentition. Neck: supple, no adenopathy,JVD or thyromegaly.No bruits.  Chest: Clear to ascultation bilaterally.No crackles or wheezes. Non tender to palpation  Breast: No asymetry,no masses. No nipple discharge or inversion. No axillary or supraclavicular adenopathy  Cardiovascular system; Heart sounds normal,  S1 and  S2 ,no S3.  No murmur, or thrill. Apical beat not displaced Peripheral pulses normal.  Abdomen: Soft, non tender, no organomegaly or masses. No bruits. Bowel sounds normal. No guarding, tenderness or rebound.  Rectal:  Normal sphincter tone. No hemorrhoids or  masses. guaiac negative stool.  GU: No penile lesion or discharge. No testicular mass or tenderness.  Musculoskeletal exam: Full ROM of spine, hips , shoulders and knees. No deformity ,swelling or crepitus noted. No muscle wasting or atrophy.   Neurologic: Cranial nerves 2 to 12 intact. Power, tone ,sensation and reflexes normal throughout. No disturbance in gait. No tremor.  Skin: Intact, no ulceration, erythema , scaling or rash noted.  Psych; Normal mood and affect.         Assessment & Plan:  Physical exam Annual exam as documented. Counseling done  re healthy lifestyle involving commitment to 150 minutes exercise per week, heart  healthy diet, .The importance of adequate sleep also discussed. Regular seat belt use and home safety, is also discussed. Changes in health habits are decided on by the patient with goals and time frames  set for achieving them.Main issue of alcohol and niccotine addiction is discussed and cessation is encouraged. Pt in denial re alcohol use. Immunization and cancer screening needs are specifically addressed at this visit.

## 2014-10-15 ENCOUNTER — Ambulatory Visit (INDEPENDENT_AMBULATORY_CARE_PROVIDER_SITE_OTHER): Payer: Self-pay | Admitting: Urology

## 2014-10-15 DIAGNOSIS — C61 Malignant neoplasm of prostate: Secondary | ICD-10-CM

## 2014-10-17 ENCOUNTER — Telehealth: Payer: Self-pay | Admitting: *Deleted

## 2014-10-17 NOTE — Telephone Encounter (Signed)
CALLED PATIENT TO REMIND OF APPTS. FOR 10-18-14, SPOKE WITH PATIENT AND HE IS AWARE OF THESE APPTS.

## 2014-10-18 ENCOUNTER — Ambulatory Visit
Admission: RE | Admit: 2014-10-18 | Discharge: 2014-10-18 | Disposition: A | Discharge status: Home / Self Care | Payer: Medicaid Other | Source: Ambulatory Visit | Attending: Radiation Oncology | Admitting: Radiation Oncology

## 2014-10-18 ENCOUNTER — Encounter: Payer: Self-pay | Admitting: Radiation Oncology

## 2014-10-18 ENCOUNTER — Ambulatory Visit: Payer: Medicaid Other | Admitting: Radiation Oncology

## 2014-10-18 ENCOUNTER — Ambulatory Visit
Admit: 2014-10-18 | Discharge: 2014-10-18 | Disposition: A | Discharge status: Home / Self Care | Payer: Medicaid Other | Attending: Radiation Oncology | Admitting: Radiation Oncology

## 2014-10-18 VITALS — BP 133/84 | HR 74 | Resp 16 | Wt 153.9 lb

## 2014-10-18 DIAGNOSIS — C61 Malignant neoplasm of prostate: Secondary | ICD-10-CM

## 2014-10-18 DIAGNOSIS — Z510 Encounter for antineoplastic radiation therapy: Secondary | ICD-10-CM | POA: Diagnosis not present

## 2014-10-18 MED ORDER — TAMSULOSIN HCL 0.4 MG PO CAPS: 0.4000 mg | ORAL_CAPSULE | Freq: Every day | ORAL | Status: DC

## 2014-10-18 NOTE — Progress Notes (Signed)
Patient here today with his brother for post seed appt with Dr. Tammi Klippel. Reports mild dysuria. Denies hematuria. Denies diarrhea. Denies pain. Reports nocturia x 5. Reports hesitancy but, denies straining to void. Reports a weak stream with post void dribble. Denies incontinence. Denies weight loss or night sweats.

## 2014-10-18 NOTE — Progress Notes (Signed)
  Radiation Oncology         [phone] ________________________________  Name: Stanley Jimenez MRN: [id number]  Date: 10/18/2014  DOB: 10-10-1955  Follow-Up Visit Note  CC: [name], MD  [name], MD  Diagnosis:   59 yo man with T1c prostate cancer with Gleason 3+3 and PSA of 8.89    ICD-9-CM ICD-10-CM   1. Malignant neoplasm of prostate 185 C61     Interval Since Last Radiation:  3  weeks  Narrative:  The patient returns today for routine follow-up.  He is complaining of increased urinary frequency and urinary hesitation symptoms. He filled out a questionnaire regarding urinary function today providing and overall IPSS score of high.  ALLERGIES:  has No Known Allergies.  Meds: Current Outpatient Prescriptions  Medication Sig Dispense Refill  . amLODipine (NORVASC) 10 MG tablet One tablet every morning for high blood pressure 30 tablet 5  . HYDROcodone-acetaminophen (NORCO) 5-325 MG per tablet Take 1 tablet by mouth every 4 (four) hours as needed for moderate pain. (Patient not taking: Reported on 10/18/2014) 15 tablet 0   No current facility-administered medications for this encounter.    Physical Findings: The patient is in no acute distress. Patient is alert and oriented.  weight is 153 lb 14.4 oz (69.809 kg). His blood pressure is 133/84 and his pulse is 74. His respiration is 16. .  No significant changes.  Lab Findings: Lab Results  Component Value Date   WBC 8.3 09/13/2014   HGB 13.1 09/13/2014   HCT 39.2 09/13/2014   MCV 87.9 09/13/2014   PLT 257 09/13/2014    Radiographic Findings:  Patient underwent CT imaging in our clinic for post implant dosimetry. The CT appears to demonstrate an adequate distribution of radioactive seeds throughout the prostate gland. There no seeds in her near the rectum. I suspect the final radiation plan and dosimetry will show appropriate coverage of the prostate gland.   Impression: The patient is recovering  from the effects of radiation. His urinary symptoms should gradually improve over the next 4-6 months. We talked about this today. He is encouraged by his improvement already and is otherwise please with his outcome.   Plan: Today, I spent time talking to the patient about his prostate seed implant and resolving urinary symptoms. We also talked about long-term follow-up for prostate cancer following seed implant. He understands that ongoing PSA determinations and digital rectal exams will help perform surveillance to rule out disease recurrence. He understands what to expect with his PSA measures. Patient was also educated today about some of the long-term effects from radiation including a small risk for rectal bleeding and possibly erectile dysfunction. We talked about some of the general management approaches to these potential complications. However, I did encourage the patient to contact our office or return at any point if he has questions or concerns related to his previous radiation and prostate cancer.  We'll give trial of flomax. _____________________________________  Sheral Apley. Tammi Klippel, M.D.

## 2014-10-20 NOTE — Progress Notes (Signed)
  Radiation Oncology         (336) [phone] ________________________________  Name: Stanley Jimenez MRN: [id number]  Date: 10/18/2014  DOB: 29-Apr-1956  COMPLEX SIMULATION NOTE  NARRATIVE:  The patient was brought to the [facility] today following prostate seed implantation approximately one month ago.  Identity was confirmed.  All relevant records and images related to the planned course of therapy were reviewed.  Then, the patient was set-up supine.  CT images were obtained.  The CT images were loaded into the planning software.  Then the prostate and rectum were contoured.  Treatment planning then occurred.  The implanted iodine 125 seeds were identified by the physics staff for projection of radiation distribution  I have requested : 3D Simulation  I have requested a DVH of the following structures: Prostate and rectum.    ________________________________  Sheral Apley Tammi Klippel, M.D.

## 2014-10-29 ENCOUNTER — Encounter: Payer: Self-pay | Admitting: Radiation Oncology

## 2014-10-29 DIAGNOSIS — Z510 Encounter for antineoplastic radiation therapy: Secondary | ICD-10-CM | POA: Diagnosis not present

## 2014-11-03 NOTE — Progress Notes (Signed)
  Radiation Oncology         [phone] ________________________________  Name: Stanley Jimenez MRN: [id number]  Date: 10/29/2014  DOB: July 27, 1955  3D Planning Note   Prostate Brachytherapy Post-Implant Dosimetry  Diagnosis: 59 yo man with T1c prostate cancer with Gleason 3+3 and PSA of 8.89  Narrative: On a previous date, [name] returned following prostate seed implantation for post implant planning. He underwent CT scan complex simulation to delineate the three-dimensional structures of the pelvis and demonstrate the radiation distribution.  Since that time, the seed localization, and complex isodose planning with dose volume histograms have now been completed.  Results:   Prostate Coverage - The dose of radiation delivered to the 90% or more of the prostate gland (D90) was 125.02% of the prescription dose. This exceeds our goal of greater than 90%. Rectal Sparing - The volume of rectal tissue receiving the prescription dose or higher was 0.0 cc. This falls under our thresholds tolerance of 1.0 cc.  Impression: The prostate seed implant appears to show adequate target coverage and appropriate rectal sparing.  Plan:  The patient will continue to follow with urology for ongoing PSA determinations. I would anticipate a high likelihood for local tumor control with minimal risk for rectal morbidity.  ________________________________  Sheral Apley Tammi Klippel, M.D.

## 2014-11-11 ENCOUNTER — Other Ambulatory Visit: Payer: Self-pay | Admitting: Family Medicine

## 2015-01-14 ENCOUNTER — Ambulatory Visit (INDEPENDENT_AMBULATORY_CARE_PROVIDER_SITE_OTHER): Payer: Medicaid Other | Admitting: Urology

## 2015-01-14 DIAGNOSIS — C61 Malignant neoplasm of prostate: Secondary | ICD-10-CM | POA: Diagnosis not present

## 2015-02-03 ENCOUNTER — Ambulatory Visit (INDEPENDENT_AMBULATORY_CARE_PROVIDER_SITE_OTHER): Payer: Medicaid Other | Admitting: Family Medicine

## 2015-02-03 ENCOUNTER — Encounter: Payer: Self-pay | Admitting: Family Medicine

## 2015-02-03 VITALS — BP 126/72 | HR 86 | Resp 18 | Ht 69.0 in | Wt 154.0 lb

## 2015-02-03 DIAGNOSIS — Z23 Encounter for immunization: Secondary | ICD-10-CM | POA: Diagnosis not present

## 2015-02-03 DIAGNOSIS — Z720 Tobacco use: Secondary | ICD-10-CM | POA: Diagnosis not present

## 2015-02-03 DIAGNOSIS — F1020 Alcohol dependence, uncomplicated: Secondary | ICD-10-CM

## 2015-02-03 DIAGNOSIS — L404 Guttate psoriasis: Secondary | ICD-10-CM

## 2015-02-03 DIAGNOSIS — K7010 Alcoholic hepatitis without ascites: Secondary | ICD-10-CM

## 2015-02-03 DIAGNOSIS — F17200 Nicotine dependence, unspecified, uncomplicated: Secondary | ICD-10-CM

## 2015-02-03 DIAGNOSIS — I10 Essential (primary) hypertension: Secondary | ICD-10-CM

## 2015-02-03 DIAGNOSIS — C61 Malignant neoplasm of prostate: Secondary | ICD-10-CM

## 2015-02-03 MED ORDER — ASPIRIN EC 81 MG PO TBEC: 81.0000 mg | DELAYED_RELEASE_TABLET | Freq: Every day | ORAL | Status: DC

## 2015-02-03 NOTE — Progress Notes (Signed)
Subjective:    Patient ID: Stanley Jimenez, male    DOB: 1956-03-14, 59 y.o.   MRN: [id number]  HPI   Stanley Jimenez     MRN: [id number]      DOB: 02/17/1956   HPI Mr. Pavone is here for follow up and re-evaluation of chronic medical conditions, medication management and review of any available recent lab and radiology data.  Preventive health is updated, specifically  Cancer screening and Immunization.   Questions or concerns regarding consultations or procedures which the PT has had in the interim are  addressed. The PT denies any adverse reactions to current medications since the last visit.  There are no new concerns.  There are no specific complaints   ROS Denies recent fever or chills. Denies sinus pressure, nasal congestion, ear pain or sore throat. Denies chest congestion, productive cough or wheezing. Denies chest pains, palpitations and leg swelling Denies abdominal pain, nausea, vomiting,diarrhea or constipation.   Denies dysuria, frequency, hesitancy or incontinence. Denies joint pain, swelling and limitation in mobility. Denies headaches, seizures, numbness, or tingling. Denies depression, anxiety or insomnia. Denies skin break down or rash.   PE  BP 126/72 mmHg  Pulse 86  Resp 18  Ht 5\' 9"  (1.753 m)  Wt 154 lb (69.854 kg)  BMI 22.73 kg/m2  SpO2 99%  Patient alert and oriented and in no cardiopulmonary distress.  HEENT: No facial asymmetry, EOMI,   oropharynx pink and moist.  Neck supple no JVD, no mass.  Chest: Clear to auscultation bilaterally.  CVS: S1, S2 no murmurs, no S3.Regular rate.  ABD: Soft non tender.   Ext: No edema  MS: Adequate ROM spine, shoulders, hips and knees.  Skin: Intact, no ulcerations or rash noted.  Psych: Good eye contact, normal affect. Memory intact not anxious or depressed appearing.  CNS: CN 2-12 intact, power,  normal throughout.no focal deficits noted.   Assessment & Plan   Essential  hypertension Controlled, no change in medication DASH diet and commitment to daily physical activity for a minimum of 30 minutes discussed and encouraged, as a part of hypertension management. The importance of attaining a healthy weight is also discussed.  BP/Weight 02/03/2015 10/18/2014 09/25/2014 09/20/2014 04/23/2014 04/11/2014 35/00/9381  Systolic BP 829 937 169 678 938 101 751  Diastolic BP 72 84 82 81 87 82 94  Wt. (Lbs) 154 153.9 153 149.5 - 144 -  BMI 22.73 22.72 22.58 22.07 - 02.58 -        Alcoholic hepatitis Normal liver function when ;last checked, denies alcohol use for over 12 months  Psoriasis, guttate Current or recent flare  Malignant neoplasm of prostate Followed closely by urology, seen every 6 month  NICOTINE ADDICTION Patient counseled for approximately 5 minutes regarding the health risks of ongoing nicotine use, specifically all types of cancer, heart disease, stroke and respiratory failure. The options available for help with cessation ,the behavioral changes to assist the process, and the option to either gradully reduce usage  Or abruptly stop.is also discussed. Pt is also encouraged to set specific goals in number of cigarettes used daily, as well as to set a quit date.  Number of cigarettes/cigars currently smoking daily: 12 to 15   Alcoholic Reports over 12 month absinance  Need for prophylactic vaccination and inoculation against influenza After obtaining informed consent, the vaccine is  administered by LPN.        Review of Systems     Objective:   Physical Exam  Assessment & Plan:

## 2015-02-03 NOTE — Assessment & Plan Note (Signed)

## 2015-02-03 NOTE — Assessment & Plan Note (Signed)
Current or recent flare

## 2015-02-03 NOTE — Assessment & Plan Note (Signed)
Reports over 12 month absinance

## 2015-02-03 NOTE — Assessment & Plan Note (Signed)
Normal liver function when ;last checked, denies alcohol use for over 12 months

## 2015-02-03 NOTE — Assessment & Plan Note (Signed)
Controlled, no change in medication DASH diet and commitment to daily physical activity for a minimum of 30 minutes discussed and encouraged, as a part of hypertension management. The importance of attaining a healthy weight is also discussed.  BP/Weight 02/03/2015 10/18/2014 09/25/2014 09/20/2014 04/23/2014 04/11/2014 68/15/9470  Systolic BP 761 518 343 735 789 784 784  Diastolic BP 72 84 82 81 87 82 94  Wt. (Lbs) 154 153.9 153 149.5 - 144 -  BMI 22.73 22.72 22.58 22.07 - 21.26 -

## 2015-02-03 NOTE — Assessment & Plan Note (Signed)
After obtaining informed consent, the vaccine is  administered by LPN.  

## 2015-02-03 NOTE — Assessment & Plan Note (Signed)
Followed closely by urology, seen every 6 month

## 2015-02-03 NOTE — Patient Instructions (Addendum)
F/u in 5 months, call if you need me before  Flu vaccine today  BP is excellent  Start aspirin 81 mg one daily to help protect your heart  Continue to stay away from alcohol  Current cigarette about 12 to 15 daily, try to start approx 10 per day with a view to quitting

## 2015-03-31 ENCOUNTER — Other Ambulatory Visit: Payer: Self-pay

## 2015-03-31 ENCOUNTER — Other Ambulatory Visit: Payer: Self-pay | Admitting: Family Medicine

## 2015-03-31 MED ORDER — AMLODIPINE BESYLATE 10 MG PO TABS: ORAL_TABLET | ORAL | Status: DC

## 2015-05-13 ENCOUNTER — Ambulatory Visit (INDEPENDENT_AMBULATORY_CARE_PROVIDER_SITE_OTHER): Payer: Medicaid Other | Admitting: Urology

## 2015-05-13 DIAGNOSIS — C61 Malignant neoplasm of prostate: Secondary | ICD-10-CM | POA: Diagnosis not present

## 2015-05-13 DIAGNOSIS — N5201 Erectile dysfunction due to arterial insufficiency: Secondary | ICD-10-CM | POA: Diagnosis not present

## 2015-05-13 DIAGNOSIS — N320 Bladder-neck obstruction: Secondary | ICD-10-CM | POA: Diagnosis not present

## 2015-05-19 ENCOUNTER — Other Ambulatory Visit: Payer: Self-pay | Admitting: Family Medicine

## 2015-07-09 ENCOUNTER — Ambulatory Visit: Payer: Medicaid Other | Admitting: Family Medicine

## 2015-07-17 ENCOUNTER — Encounter: Payer: Self-pay | Admitting: Family Medicine

## 2015-07-17 ENCOUNTER — Ambulatory Visit (INDEPENDENT_AMBULATORY_CARE_PROVIDER_SITE_OTHER): Payer: Medicaid Other | Admitting: Family Medicine

## 2015-07-17 VITALS — BP 106/60 | HR 96 | Resp 18 | Ht 69.0 in | Wt 157.0 lb

## 2015-07-17 DIAGNOSIS — L404 Guttate psoriasis: Secondary | ICD-10-CM

## 2015-07-17 DIAGNOSIS — Z1211 Encounter for screening for malignant neoplasm of colon: Secondary | ICD-10-CM | POA: Diagnosis not present

## 2015-07-17 DIAGNOSIS — I10 Essential (primary) hypertension: Secondary | ICD-10-CM

## 2015-07-17 DIAGNOSIS — F1021 Alcohol dependence, in remission: Secondary | ICD-10-CM

## 2015-07-17 DIAGNOSIS — F17200 Nicotine dependence, unspecified, uncomplicated: Secondary | ICD-10-CM

## 2015-07-17 DIAGNOSIS — E785 Hyperlipidemia, unspecified: Secondary | ICD-10-CM | POA: Diagnosis not present

## 2015-07-17 NOTE — Progress Notes (Signed)
Subjective:    Patient ID: Stanley Jimenez, male    DOB: Sep 11, 1955, 60 y.o.   MRN: [id number]  HPI   Stanley Jimenez     MRN: [id number]      DOB: [birth year]   HPI Stanley Jimenez is here for follow up and re-evaluation of chronic medical conditions, medication management and review of any available recent lab and radiology data.  Preventive health is updated, specifically  Cancer screening and Immunization.   States he will get colonoscopy , understands he needs this Following with urology for his prostate cancer and doing well The PT denies any adverse reactions to current medications since the last visit.  There are no new concerns. Remains alcohol free, working on smoking cessation 2 day h/o left shoulder pain after lifting, minor , no limitation in mobility  ROS Denies recent fever or chills. Denies sinus pressure, nasal congestion, ear pain or sore throat. Denies chest congestion, productive cough or wheezing. Denies chest pains, palpitations and leg swelling Denies abdominal pain, nausea, vomiting,diarrhea or constipation.   Denies dysuria, frequency, hesitancy or incontinence. Denies headaches, seizures, numbness, or tingling. Denies depression, anxiety or insomnia. Denies skin break down or rash.   PE  BP 106/60 mmHg  Pulse 96  Resp 18  Ht 5\' 9"  (1.753 m)  Wt 157 lb (71.215 kg)  BMI 23.17 kg/m2  SpO2 99%  Patient alert and oriented and in no cardiopulmonary distress.  HEENT: No facial asymmetry, EOMI,   oropharynx pink and moist.  Neck supple no JVD, no mass.  Chest: Clear to auscultation bilaterally.  CVS: S1, S2 no murmurs, no S3.Regular rate.  ABD: Soft non tender.   Ext: No edema  MS: Adequate ROM spine, shoulders, hips and knees.  Skin: Intact, no ulcerations  noted.  Psych: Good eye contact, normal affect. Memory intact not anxious or depressed appearing.  CNS: CN 2-12 intact, power,  normal throughout.no focal deficits noted.   Assessment &  Plan   Essential hypertension Controlled, no change in medication DASH diet and commitment to daily physical activity for a minimum of 30 minutes discussed and encouraged, as a part of hypertension management. The importance of attaining a healthy weight is also discussed.  BP/Weight 07/17/2015 02/03/2015 10/18/2014 09/25/2014 09/20/2014 04/23/2014 Q000111Q  Systolic BP A999333 [id number] Q000111Q 0000000 Q000111Q 123XX123 123456  Diastolic BP 60 72 84 82 81 87 82  Wt. (Lbs) 157 154 153.9 153 149.5 - 144  BMI 23.17 22.73 22.72 22.58 22.07 - 21.26        NICOTINE ADDICTION Patient counseled for approximately 5 minutes regarding the health risks of ongoing nicotine use, specifically all types of cancer, heart disease, stroke and respiratory failure. The options available for help with cessation ,the behavioral changes to assist the process, and the option to either gradully reduce usage  Or abruptly stop.is also discussed. Pt is also encouraged to set specific goals in number of cigarettes used daily, as well as to set a quit date.  Number of cigarettes/cigars currently smoking daily: 15 to 18   Hyperlipidemia Hyperlipidemia:Low fat diet discussed and encouraged.   Lipid Panel  Lab Results  Component Value Date   CHOL 237* 10/12/2011   HDL 129 10/12/2011   LDLCALC 83 10/12/2011   TRIG 127 10/12/2011   CHOLHDL 1.8 10/12/2011   Updated lab needed at/ before next visit.      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

## 2015-07-17 NOTE — Assessment & Plan Note (Signed)
Controlled well with twice daily topical steroid

## 2015-07-17 NOTE — Assessment & Plan Note (Signed)
Hyperlipidemia:Low fat diet discussed and encouraged.   Lipid Panel  Lab Results  Component Value Date   CHOL 237* 10/12/2011   HDL 129 10/12/2011   LDLCALC 83 10/12/2011   TRIG 127 10/12/2011   CHOLHDL 1.8 10/12/2011   Updated lab needed at/ before next visit.

## 2015-07-17 NOTE — Assessment & Plan Note (Signed)
Reports that he continues to refrain from alcohol, encouraged to do so, states he "feels like a person" and had become "tired of his family talking junk about him"

## 2015-07-17 NOTE — Assessment & Plan Note (Signed)
Controlled, no change in medication DASH diet and commitment to daily physical activity for a minimum of 30 minutes discussed and encouraged, as a part of hypertension management. The importance of attaining a healthy weight is also discussed.  BP/Weight 07/17/2015 02/03/2015 10/18/2014 09/25/2014 09/20/2014 04/23/2014 Q000111Q  Systolic BP A999333 123XX123 Q000111Q 0000000 Q000111Q 123XX123 123456  Diastolic BP 60 72 84 82 81 87 82  Wt. (Lbs) 157 154 153.9 153 149.5 - 144  BMI 23.17 22.73 22.72 22.58 22.07 - 21.26

## 2015-07-17 NOTE — Patient Instructions (Addendum)
F/u in 6 month, call if you need me before  CONGRATS, doing much better!!!  10 cigarettes per day then keep cutting back   KEEP on NO DRINKING!!!!  You need colonoscopy and Dr Olevia Perches office will mail a letter for you to call for apt   Fasting labs first week in May  Thanks for choosing [facility], we consider it a privelige to serve you.

## 2015-07-17 NOTE — Assessment & Plan Note (Signed)

## 2015-07-21 ENCOUNTER — Encounter (INDEPENDENT_AMBULATORY_CARE_PROVIDER_SITE_OTHER): Payer: Self-pay | Admitting: *Deleted

## 2015-08-05 ENCOUNTER — Other Ambulatory Visit: Payer: Self-pay | Admitting: Family Medicine

## 2015-09-24 LAB — CBC
HCT: 38.6 % (ref 38.5–50.0)
HEMOGLOBIN: 12.8 g/dL — AB (ref 13.2–17.1)
MCH: 28.3 pg (ref 27.0–33.0)
MCHC: 33.2 g/dL (ref 32.0–36.0)
MCV: 85.4 fL (ref 80.0–100.0)
MPV: 9.1 fL (ref 7.5–12.5)
PLATELETS: 267 10*3/uL (ref 140–400)
RBC: 4.52 MIL/uL (ref 4.20–5.80)
RDW: 14.7 % (ref 11.0–15.0)
WBC: 8.1 10*3/uL (ref 3.8–10.8)

## 2015-09-25 LAB — LIPID PANEL
Cholesterol: 181 mg/dL (ref 125–200)
HDL: 60 mg/dL (ref >=40)
LDL CALC: 101 mg/dL (ref <130)
TRIGLYCERIDES: 100 mg/dL (ref <150)
Total CHOL/HDL Ratio: 3 Ratio (ref <=5.0)
VLDL: 20 mg/dL (ref <30)

## 2015-09-25 LAB — TSH: TSH: 0.83 m[IU]/L (ref 0.40–4.50)

## 2015-09-25 LAB — COMPREHENSIVE METABOLIC PANEL
ALT: 14 U/L (ref 9–46)
AST: 13 U/L (ref 10–35)
Albumin: 4.3 g/dL (ref 3.6–5.1)
Alkaline Phosphatase: 78 U/L (ref 40–115)
BILIRUBIN TOTAL: 0.5 mg/dL (ref 0.2–1.2)
BUN: 16 mg/dL (ref 7–25)
CO2: 26 mmol/L (ref 20–31)
Calcium: 9.3 mg/dL (ref 8.6–10.3)
Chloride: 102 mmol/L (ref 98–110)
Creat: 1.01 mg/dL (ref 0.70–1.33)
Glucose, Bld: 95 mg/dL (ref 65–99)
Potassium: 4.2 mmol/L (ref 3.5–5.3)
SODIUM: 137 mmol/L (ref 135–146)
Total Protein: 6.8 g/dL (ref 6.1–8.1)

## 2015-11-11 ENCOUNTER — Ambulatory Visit (INDEPENDENT_AMBULATORY_CARE_PROVIDER_SITE_OTHER): Payer: Medicaid Other | Admitting: Urology

## 2015-11-11 ENCOUNTER — Other Ambulatory Visit (HOSPITAL_COMMUNITY)
Admission: RE | Admit: 2015-11-11 | Discharge: 2015-11-11 | Disposition: A | Discharge status: Home / Self Care | Payer: Medicaid Other | Source: Other Acute Inpatient Hospital | Attending: Urology | Admitting: Urology

## 2015-11-11 DIAGNOSIS — N401 Enlarged prostate with lower urinary tract symptoms: Secondary | ICD-10-CM | POA: Diagnosis not present

## 2015-11-11 DIAGNOSIS — C61 Malignant neoplasm of prostate: Secondary | ICD-10-CM | POA: Diagnosis not present

## 2015-11-11 LAB — URINALYSIS, ROUTINE W REFLEX MICROSCOPIC
BILIRUBIN URINE: NEGATIVE
GLUCOSE, UA: NEGATIVE mg/dL
HGB URINE DIPSTICK: NEGATIVE
Ketones, ur: NEGATIVE mg/dL
Leukocytes, UA: NEGATIVE
Nitrite: NEGATIVE
Protein, ur: NEGATIVE mg/dL
SPECIFIC GRAVITY, URINE: 1.015 (ref 1.005–1.030)
pH: 6 (ref 5.0–8.0)

## 2015-12-03 ENCOUNTER — Other Ambulatory Visit: Payer: Self-pay | Admitting: Family Medicine

## 2016-01-14 ENCOUNTER — Other Ambulatory Visit (INDEPENDENT_AMBULATORY_CARE_PROVIDER_SITE_OTHER): Payer: Self-pay | Admitting: *Deleted

## 2016-01-14 ENCOUNTER — Encounter: Payer: Self-pay | Admitting: Family Medicine

## 2016-01-14 ENCOUNTER — Ambulatory Visit (INDEPENDENT_AMBULATORY_CARE_PROVIDER_SITE_OTHER): Payer: Medicaid Other | Admitting: Family Medicine

## 2016-01-14 VITALS — BP 104/72 | HR 88 | Resp 16 | Ht 69.0 in | Wt 149.0 lb

## 2016-01-14 DIAGNOSIS — C61 Malignant neoplasm of prostate: Secondary | ICD-10-CM

## 2016-01-14 DIAGNOSIS — L404 Guttate psoriasis: Secondary | ICD-10-CM

## 2016-01-14 DIAGNOSIS — I10 Essential (primary) hypertension: Secondary | ICD-10-CM

## 2016-01-14 DIAGNOSIS — F17200 Nicotine dependence, unspecified, uncomplicated: Secondary | ICD-10-CM | POA: Diagnosis not present

## 2016-01-14 DIAGNOSIS — R1310 Dysphagia, unspecified: Secondary | ICD-10-CM | POA: Diagnosis not present

## 2016-01-14 DIAGNOSIS — Z1211 Encounter for screening for malignant neoplasm of colon: Secondary | ICD-10-CM | POA: Insufficient documentation

## 2016-01-14 DIAGNOSIS — Z23 Encounter for immunization: Secondary | ICD-10-CM

## 2016-01-14 DIAGNOSIS — K2100 Gastro-esophageal reflux disease with esophagitis, without bleeding: Secondary | ICD-10-CM

## 2016-01-14 MED ORDER — PANTOPRAZOLE SODIUM 20 MG PO TBEC: 20.0000 mg | DELAYED_RELEASE_TABLET | Freq: Every day | ORAL | 3 refills | Status: DC

## 2016-01-14 NOTE — Progress Notes (Signed)
   Stanley Jimenez     MRN: [id number]      DOB: 10/21/1955   HPI Stanley Jimenez is here for follow up and re-evaluation of chronic medical conditions, medication management and review of any available recent lab and radiology data.  Preventive health is updated, specifically  Cancer screening and Immunization.  Still attempting to have colonoscopy scheduled Questions or concerns regarding consultations or procedures which the PT has had in the interim are  addressed. The PT denies any adverse reactions to current medications since the last visit.  C/o stomach pain and dysphagia ROS Denies recent fever or chills. Denies sinus pressure, nasal congestion, ear pain or sore throat. Denies chest congestion, productive cough or wheezing. Denies chest pains, palpitations and leg swelling Denies vomiting,diarrhea or constipation.   Denies dysuria, frequency, hesitancy or incontinence. Denies joint pain, swelling and limitation in mobility. Denies headaches, seizures, numbness, or tingling. Denies depression, anxiety or insomnia. Denies skin break    PE  BP 104/72   Pulse 88   Resp 16   Ht 5\' 9"  (1.753 m)   Wt 149 lb (67.6 kg)   SpO2 98%   BMI 22.00 kg/m   Patient alert and oriented and in no cardiopulmonary distress.  HEENT: No facial asymmetry, EOMI,   oropharynx pink and moist.  Neck supple no JVD, no mass.  Chest: Clear to auscultation bilaterally.  CVS: S1, S2 no murmurs, no S3.Regular rate.  ABD: Soft non tender.   Ext: No edema  MS: Adequate ROM spine, shoulders, hips and knees.  Skin: Intact, no ulcerations or rash noted.  Psych: Good eye contact, normal affect. Memory intact not anxious or depressed appearing.  CNS: CN 2-12 intact, power,  normal throughout.no focal deficits noted.   Assessment & Plan  Dysphagia 1 month  H/o solid dysphagia, and GERD, needs GI eval  Essential hypertension Controlled, no change in medication DASH diet and commitment to daily  physical activity for a minimum of 30 minutes discussed and encouraged, as a part of hypertension management. The importance of attaining a healthy weight is also discussed.  BP/Weight 01/14/2016 07/17/2015 02/03/2015 10/18/2014 09/25/2014 09/20/2014 0000000  Systolic BP 123456 A999333 123XX123 Q000111Q 0000000 Q000111Q 123XX123  Diastolic BP 72 60 72 84 82 81 87  Wt. (Lbs) 149 157 154 153.9 153 149.5 -  BMI 22 23.17 22.73 22.72 22.58 22.07 -       NICOTINE ADDICTION Patient counseled for approximately 5 minutes regarding the health risks of ongoing nicotine use, specifically all types of cancer, heart disease, stroke and respiratory failure. The options available for help with cessation ,the behavioral changes to assist the process, and the option to either gradully reduce usage  Or abruptly stop.is also discussed. Pt is also encouraged to set specific goals in number of cigarettes used daily, as well as to set a quit date.     Special screening for malignant neoplasms, colon Still awaiting colonoscopy appt, will help to facilitate the process  Psoriasis, guttate No recent or current flare, uses topical steroid per dermatology on a regular basis , reportedly  Malignant neoplasm of prostate Followed by urology , most recent PSA level is within normal range  Need for prophylactic vaccination and inoculation against influenza After obtaining informed consent, the vaccine is  administered by LPN.

## 2016-01-14 NOTE — Assessment & Plan Note (Signed)
1 month  H/o solid dysphagia, and GERD, needs GI eval

## 2016-01-14 NOTE — Patient Instructions (Addendum)
F/u in 5 month, call if you need me before  You are referred to Dr Laural Golden we will try to get an appointment for you before you leave  Flu vaccine today  CONGRATS on  No alcohol  Please cut back on smoking , now you smoke 15 per day, every month cut down by 1 cigarette Please work on good  health habits so that your health will improve. 1. Commitment to daily physical activity for 30 to 60  minutes, if you are able to do this.  2. Commitment to wise food choices. Aim for half of your  food intake to be vegetable and fruit, one quarter starchy foods, and one quarter protein. Try to eat on a regular schedule  3 meals per day, snacking between meals should be limited to vegetables or fruits or small portions of nuts. 64 ounces of water per day is generally recommended, unless you have specific health conditions, like heart failure or kidney failure where you will need to limit fluid intake.  3. Commitment to sufficient and a  good quality of physical and mental rest daily, generally between 6 to 8 hours per day.  WITH PERSISTANCE AND PERSEVERANCE, THE IMPOSSIBLE , BECOMES THE NORM! Thank you  for choosing [facility]. We consider it a privelige to serve you.  Delivering excellent health care in a caring and  compassionate way is our goal.  Partnering with you,  so that together we can achieve this goal is our strategy.

## 2016-01-17 NOTE — Assessment & Plan Note (Signed)

## 2016-01-17 NOTE — Assessment & Plan Note (Signed)
Followed by urology , most recent PSA level is within normal range

## 2016-01-17 NOTE — Assessment & Plan Note (Signed)
After obtaining informed consent, the vaccine is  administered by LPN.  

## 2016-01-17 NOTE — Assessment & Plan Note (Signed)
No recent or current flare, uses topical steroid per dermatology on a regular basis , reportedly

## 2016-01-17 NOTE — Assessment & Plan Note (Signed)
Controlled, no change in medication DASH diet and commitment to daily physical activity for a minimum of 30 minutes discussed and encouraged, as a part of hypertension management. The importance of attaining a healthy weight is also discussed.  BP/Weight 01/14/2016 07/17/2015 02/03/2015 10/18/2014 09/25/2014 09/20/2014 0000000  Systolic BP 123456 A999333 123XX123 Q000111Q 0000000 Q000111Q 123XX123  Diastolic BP 72 60 72 84 82 81 87  Wt. (Lbs) 149 157 154 153.9 153 149.5 -  BMI 22 23.17 22.73 22.72 22.58 22.07 -

## 2016-01-17 NOTE — Assessment & Plan Note (Signed)
Still awaiting colonoscopy appt, will help to facilitate the process

## 2016-01-19 DIAGNOSIS — Z23 Encounter for immunization: Secondary | ICD-10-CM | POA: Diagnosis not present

## 2016-01-22 ENCOUNTER — Encounter (INDEPENDENT_AMBULATORY_CARE_PROVIDER_SITE_OTHER): Payer: Self-pay | Admitting: Internal Medicine

## 2016-02-05 ENCOUNTER — Other Ambulatory Visit (INDEPENDENT_AMBULATORY_CARE_PROVIDER_SITE_OTHER): Payer: Self-pay | Admitting: Internal Medicine

## 2016-02-05 ENCOUNTER — Encounter (INDEPENDENT_AMBULATORY_CARE_PROVIDER_SITE_OTHER): Payer: Self-pay | Admitting: *Deleted

## 2016-02-05 ENCOUNTER — Ambulatory Visit (INDEPENDENT_AMBULATORY_CARE_PROVIDER_SITE_OTHER): Payer: Medicaid Other | Admitting: Internal Medicine

## 2016-02-05 ENCOUNTER — Encounter (INDEPENDENT_AMBULATORY_CARE_PROVIDER_SITE_OTHER): Payer: Self-pay | Admitting: Internal Medicine

## 2016-02-05 VITALS — BP 132/70 | HR 88 | Temp 97.0°F | Ht 70.5 in | Wt 152.1 lb

## 2016-02-05 DIAGNOSIS — K7030 Alcoholic cirrhosis of liver without ascites: Secondary | ICD-10-CM | POA: Diagnosis not present

## 2016-02-05 DIAGNOSIS — Z1211 Encounter for screening for malignant neoplasm of colon: Secondary | ICD-10-CM

## 2016-02-05 DIAGNOSIS — R1310 Dysphagia, unspecified: Secondary | ICD-10-CM | POA: Diagnosis not present

## 2016-02-05 NOTE — Telephone Encounter (Signed)
error 

## 2016-02-05 NOTE — Patient Instructions (Signed)
The risks and benefits such as perforation, bleeding, and infection were reviewed with the patient and is agreeable. 

## 2016-02-05 NOTE — Progress Notes (Signed)
Subjective:    Patient ID: Stanley Jimenez, male    DOB: [birth year], 60 y.o.   MRN: [id number]  HPI Referred by Dr. Moshe Cipro for dysphagia. He tells me he has been having pain in his chest.  He also tells me he has been having some dysphagia. He has dysphagia everyday. He says foods are slow to go down.  Symptoms for about a month. He has to force his foods down his esophagus or drinks water behind it. His appetite is good.  He says he lost about 8 pounds over the past 6 months but he says he gained about 4 pounds back.  He denies any abdominal pain.  He has a BM daily. No melena or BRRB.   Hx of cirrhosis per records. Hx of DTs in the past. Has not drank since 2015. CBC Latest Ref Rng & Units 09/24/2015 09/13/2014 03/12/2014  WBC 3.8 - 10.8 K/uL 8.1 8.3 7.2  Hemoglobin 13.2 - 17.1 g/dL 12.8(L) 13.1 13.1  Hematocrit 38.5 - 50.0 % 38.6 39.2 37.2(L)  Platelets 140 - 400 K/uL 267 257 235   CMP Latest Ref Rng & Units 09/24/2015 09/13/2014 03/12/2014  Glucose 65 - 99 mg/dL 95 90 68(L)  BUN 7 - 25 mg/dL 16 15 10   Creatinine 0.70 - 1.33 mg/dL 1.01 0.91 0.89  Sodium 135 - 146 mmol/L 137 136 138  Potassium 3.5 - 5.3 mmol/L 4.2 3.9 4.1  Chloride 98 - 110 mmol/L 102 104 93(L)  CO2 20 - 31 mmol/L 26 25 26   Calcium 8.6 - 10.3 mg/dL 9.3 9.2 10.0  Total Protein 6.1 - 8.1 g/dL 6.8 7.8 8.8(H)  Total Bilirubin 0.2 - 1.2 mg/dL 0.5 0.6 0.5  Alkaline Phos 40 - 115 U/L 78 75 179(H)  AST 10 - 35 U/L 13 21 113(H)  ALT 9 - 46 U/L 14 17 95(H)     Review of Systems Past Medical History:  Diagnosis Date  . 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 Anxiety   . Arthritis   . Cirrhosis, alcoholic ([place])   . Clubbing of fingers    congenital  . Depression   . Full dentures   . History of PSVT (paroxysmal supraventricular tachycardia)    run of non-sustatined VT 03-10-2015 in setting of alcohol withdrawal in hospital  . History of seizure    03-08-2012  alcohol withdrawal  . History of  thrombocytopenia    10/ 2013  in setting of alcohol withdrawal  . Hyperlipidemia   . Hypertension   . Prostate cancer [facility]) urologist-  dr dalhstedt/  oncologist- dr Tammi Klippel   T1c, Gleason 3+3,  PSA 8.89,  vol 27cc  . 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 NO PAST SURGERIES    . RADIOACTIVE SEED IMPLANT N/A 09/20/2014   Procedure: RADIOACTIVE SEED IMPLANT;  Surgeon: Franchot Gallo, MD;  Location: [facility];  Service: Urology;  Laterality: N/A;    77   seeds implanted no seeds found in bladder    No Known Allergies  Current Outpatient Prescriptions on File Prior to Visit  Medication Sig Dispense Refill  . amLODipine (NORVASC) 10 MG tablet TAKE 1 TABLET BY MOUTH EVERY DAY 30 tablet 3  . pantoprazole (PROTONIX) 20 MG tablet Take 1 tablet (20 mg total) by mouth daily. 30 tablet 3  . tamsulosin (FLOMAX) 0.4 MG CAPS capsule Take 1 capsule (0.4 mg total) by mouth daily after  supper. 30 capsule 5   No current facility-administered medications on file prior to visit.        Objective:   Physical Exam Blood pressure 132/70, pulse 88, temperature 97 F (36.1 C), height 5' 10.5" (1.791 m), weight 152 lb 1.6 oz (69 kg). Alert and oriented. Skin warm and dry. Oral mucosa is moist.   . Sclera anicteric, conjunctivae is pink. Thyroid not enlarged. No cervical lymphadenopathy. Lungs clear. Heart regular rate and rhythm.  Abdomen is soft. Bowel sounds are positive. No hepatomegaly. No abdominal masses felt. No tenderness.  No edema to lower extremities.          Assessment & Plan:  Dysphagia. Need EGD/ED to rule out stricture. Also may be looking for varices.  ? Cirrhosis: Needs US abdomen.  Liver enzymes are normal

## 2016-02-13 ENCOUNTER — Ambulatory Visit (HOSPITAL_COMMUNITY)
Admission: RE | Admit: 2016-02-13 | Discharge: 2016-02-13 | Disposition: A | Discharge status: Home / Self Care | Payer: Medicaid Other | Source: Ambulatory Visit | Attending: Internal Medicine | Admitting: Internal Medicine

## 2016-02-13 DIAGNOSIS — K760 Fatty (change of) liver, not elsewhere classified: Secondary | ICD-10-CM | POA: Insufficient documentation

## 2016-02-13 DIAGNOSIS — K7030 Alcoholic cirrhosis of liver without ascites: Secondary | ICD-10-CM | POA: Insufficient documentation

## 2016-03-03 ENCOUNTER — Other Ambulatory Visit: Payer: Self-pay | Admitting: Family Medicine

## 2016-04-14 ENCOUNTER — Ambulatory Visit (HOSPITAL_COMMUNITY)
Admission: RE | Admit: 2016-04-14 | Discharge: 2016-04-14 | Disposition: A | Discharge status: Home / Self Care | Payer: Medicaid Other | Source: Ambulatory Visit | Attending: Internal Medicine | Admitting: Internal Medicine

## 2016-04-14 ENCOUNTER — Encounter (HOSPITAL_COMMUNITY)
Admission: RE | Disposition: A | Discharge status: Home / Self Care | Payer: Self-pay | Source: Ambulatory Visit | Attending: Internal Medicine

## 2016-04-14 ENCOUNTER — Encounter (HOSPITAL_COMMUNITY): Payer: Self-pay | Admitting: *Deleted

## 2016-04-14 DIAGNOSIS — F1011 Alcohol abuse, in remission: Secondary | ICD-10-CM | POA: Insufficient documentation

## 2016-04-14 DIAGNOSIS — K449 Diaphragmatic hernia without obstruction or gangrene: Secondary | ICD-10-CM

## 2016-04-14 DIAGNOSIS — K648 Other hemorrhoids: Secondary | ICD-10-CM | POA: Diagnosis not present

## 2016-04-14 DIAGNOSIS — Z1211 Encounter for screening for malignant neoplasm of colon: Secondary | ICD-10-CM

## 2016-04-14 DIAGNOSIS — Z8546 Personal history of malignant neoplasm of prostate: Secondary | ICD-10-CM | POA: Insufficient documentation

## 2016-04-14 DIAGNOSIS — C159 Malignant neoplasm of esophagus, unspecified: Secondary | ICD-10-CM

## 2016-04-14 DIAGNOSIS — M1990 Unspecified osteoarthritis, unspecified site: Secondary | ICD-10-CM | POA: Insufficient documentation

## 2016-04-14 DIAGNOSIS — I10 Essential (primary) hypertension: Secondary | ICD-10-CM | POA: Insufficient documentation

## 2016-04-14 DIAGNOSIS — R1310 Dysphagia, unspecified: Secondary | ICD-10-CM | POA: Diagnosis not present

## 2016-04-14 DIAGNOSIS — K644 Residual hemorrhoidal skin tags: Secondary | ICD-10-CM | POA: Diagnosis not present

## 2016-04-14 DIAGNOSIS — F17210 Nicotine dependence, cigarettes, uncomplicated: Secondary | ICD-10-CM | POA: Insufficient documentation

## 2016-04-14 DIAGNOSIS — E785 Hyperlipidemia, unspecified: Secondary | ICD-10-CM | POA: Insufficient documentation

## 2016-04-14 DIAGNOSIS — F419 Anxiety disorder, unspecified: Secondary | ICD-10-CM | POA: Insufficient documentation

## 2016-04-14 DIAGNOSIS — K2210 Ulcer of esophagus without bleeding: Secondary | ICD-10-CM | POA: Insufficient documentation

## 2016-04-14 HISTORY — PX: ESOPHAGOGASTRODUODENOSCOPY: SHX5428

## 2016-04-14 HISTORY — PX: BIOPSY: SHX5522

## 2016-04-14 HISTORY — PX: COLONOSCOPY: SHX5424

## 2016-04-14 SURGERY — EGD (ESOPHAGOGASTRODUODENOSCOPY)
Anesthesia: Moderate Sedation

## 2016-04-14 MED ORDER — STERILE WATER FOR IRRIGATION IR SOLN
Status: DC | PRN
  Administered 2016-04-14: 11:00:00

## 2016-04-14 MED ORDER — BUTAMBEN-TETRACAINE-BENZOCAINE 2-2-14 % EX AERO
INHALATION_SPRAY | CUTANEOUS | Status: DC | PRN
  Administered 2016-04-14: 2 via TOPICAL

## 2016-04-14 MED ORDER — PANTOPRAZOLE SODIUM 40 MG PO TBEC: 40.0000 mg | DELAYED_RELEASE_TABLET | Freq: Every day | ORAL | 5 refills | Status: DC

## 2016-04-14 MED ORDER — MIDAZOLAM HCL 5 MG/5ML IJ SOLN
INTRAMUSCULAR | Status: DC | PRN
  Administered 2016-04-14 (×2): 2 mg via INTRAVENOUS
  Administered 2016-04-14 (×2): 1 mg via INTRAVENOUS
  Administered 2016-04-14: 2 mg via INTRAVENOUS

## 2016-04-14 MED ORDER — MIDAZOLAM HCL 5 MG/5ML IJ SOLN
INTRAMUSCULAR | Status: AC
  Filled 2016-04-14: qty 10

## 2016-04-14 MED ORDER — SODIUM CHLORIDE 0.9 % IV SOLN
INTRAVENOUS | Status: DC
  Administered 2016-04-14: 11:00:00 via INTRAVENOUS

## 2016-04-14 MED ORDER — MEPERIDINE HCL 50 MG/ML IJ SOLN
INTRAMUSCULAR | Status: DC | PRN
  Administered 2016-04-14 (×2): 25 mg via INTRAVENOUS

## 2016-04-14 MED ORDER — MEPERIDINE HCL 50 MG/ML IJ SOLN
INTRAMUSCULAR | Status: AC
  Filled 2016-04-14: qty 1

## 2016-04-14 NOTE — Op Note (Signed)
[facility] Patient Name: Stanley Jimenez Procedure Date: 04/14/2016 11:35 AM MRN: [id number] Date of Birth: 1955-10-23 Attending MD: Hildred Laser , MD CSN: [id number] Age: 60 Admit Type: Outpatient Procedure:                Colonoscopy Indications:              Screening for colorectal malignant neoplasm Providers:                Hildred Laser, MD, Gwenlyn Fudge RN, RN, Isabella Stalling, Technician Referring MD:             Norwood Levo. Moshe Cipro, MD Medicines:                Midazolam 2 mg IV Complications:            No immediate complications. Estimated Blood Loss:     Estimated blood loss: none. Procedure:                Pre-Anesthesia Assessment:                           - Prior to the procedure, a History and Physical                            was performed, and patient medications and                            allergies were reviewed. The patient's tolerance of                            previous anesthesia was also reviewed. The risks                            and benefits of the procedure and the sedation                            options and risks were discussed with the patient.                            All questions were answered, and informed consent                            was obtained. Prior Anticoagulants: The patient has                            taken no previous anticoagulant or antiplatelet                            agents. ASA Grade Assessment: III - A patient with                            severe systemic disease. After reviewing the risks  and benefits, the patient was deemed in                            satisfactory condition to undergo the procedure.                           After obtaining informed consent, the colonoscope                            was passed under direct vision. Throughout the                            procedure, the patient's blood pressure, pulse, and                             oxygen saturations were monitored continuously. The                            [id number]) scope was introduced through                            the anus and advanced to the the cecum, identified                            by appendiceal orifice and ileocecal valve. The                            colonoscopy was somewhat difficult due to                            significant looping. Successful completion of the                            procedure was aided by changing the patient to a                            supine position, withdrawing and reinserting the                            scope and applying abdominal pressure. The patient                            tolerated the procedure well. The quality of the                            bowel preparation was fair. The ileocecal valve,                            appendiceal orifice, and rectum were photographed. Scope In: 11:40:44 AM Scope Out: 12:12:36 PM Scope Withdrawal Time: 0 hours 6 minutes 9 seconds  Total Procedure Duration: 0 hours 31 minutes 52 seconds  Findings:      The colon (entire examined portion) appeared normal.      Internal hemorrhoids were found during retroflexion.  The hemorrhoids       were small. Impression:               - Preparation of the colon was fair.                           - The entire examined colon is normal.                           - Internal hemorrhoids.                           - No specimens collected. Moderate Sedation:      Moderate (conscious) sedation was administered by the endoscopy nurse       and supervised by the endoscopist. The following parameters were       monitored: oxygen saturation, heart rate, blood pressure, CO2       capnography and response to care. Total physician intraservice time was       35 minutes. Recommendation:           - Patient has a contact number available for                            emergencies. The signs and symptoms of potential                             delayed complications were discussed with the                            patient. Return to normal activities tomorrow.                            Written discharge instructions were provided to the                            patient.                           - Patient has a contact number available for                            emergencies. The signs and symptoms of potential                            delayed complications were discussed with the                            patient. Return to normal activities tomorrow.                            Written discharge instructions were provided to the                            patient.                           - Mechanical soft  diet today.                           - Continue present medications.                           - See the other procedure note for documentation of                            additional recommendations.                           - Repeat colonoscopy in 5 years for screening                            purposes. Procedure Code(s):        --- Professional ---                           [phone], Colonoscopy, flexible; diagnostic, including                            collection of specimen(s) by brushing or washing,                            when performed (separate procedure)                           [id number], Moderate sedation services provided by the                            same physician or other qualified health care                            professional performing the diagnostic or                            therapeutic service that the sedation supports,                            requiring the presence of an independent trained                            observer to assist in the monitoring of the                            patient's level of consciousness and physiological                            status; initial 15 minutes of intraservice time,                            patient  age 63 years or older                           [phone], Moderate sedation services; each additional  15 minutes intraservice time Diagnosis Code(s):        --- Professional ---                           Z12.11, Encounter for screening for malignant                            neoplasm of colon                           K64.8, Other hemorrhoids CPT copyright 2016 [employer]. All rights reserved. The codes documented in this report are preliminary and upon coder review may  be revised to meet current compliance requirements. Hildred Laser, MD Hildred Laser, MD 04/14/2016 12:33:18 PM This report has been signed electronically. Number of Addenda: 0

## 2016-04-14 NOTE — H&P (Signed)
Stanley Jimenez is an 60 y.o. male.   Chief Complaint: She is here for EGD, ED and colonoscopy. HPI: Patient is 60 year old African male who presents to month history of dysphagia and odynophagia to solids. He points or sternal areas site of bolus obstruction. He is having dysphagia every day. He has no difficulty with liquids. He points to lower sternal areas site of bolus obstruction. Food bolus always passes down. He has good appetite and he has lost 8 pounds. He denies nausea vomiting melena or rectal bleeding. He is also undergoing average risk screening colonoscopy. Personal history significant for prostate carcinoma. Family history is negative for CRC.  Past Medical History:  Diagnosis Date  . 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 Anxiety   . Arthritis              . Depression   . Full dentures   . History of PSVT (paroxysmal supraventricular tachycardia)    run of non-sustatined VT 03-10-2015 in setting of alcohol withdrawal in hospital  . History of seizure    03-08-2012  alcohol withdrawal  . History of thrombocytopenia    10/ 2013  in setting of alcohol withdrawal  . Hyperlipidemia   . Hypertension   . Prostate cancer [facility]) urologist-  dr dalhstedt/  oncologist- dr Tammi Klippel   T1c, Gleason 3+3,  PSA 8.89,  vol 27cc  . 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 NO PAST SURGERIES    . RADIOACTIVE SEED IMPLANT N/A 09/20/2014   Procedure: RADIOACTIVE SEED IMPLANT;  Surgeon: Franchot Gallo, MD;  Location: [facility];  Service: Urology;  Laterality: N/A;    77   seeds implanted no seeds found in bladder  . TRANSURETHRAL RESECTION OF PROSTATE      Family History  Problem Relation Age of Onset  . Alcohol abuse Mother   . Alcohol abuse Sister   . Alcohol abuse Brother   . Alcohol abuse Brother   . Hypertension Sister    Social History:  reports that he has been smoking Cigarettes.  He has a 20.00  pack-year smoking history. He has never used smokeless tobacco. He reports that he drinks alcohol. He reports that he uses drugs, including Marijuana.  Allergies: No Known Allergies  Medications Prior to Admission  Medication Sig Dispense Refill  . amLODipine (NORVASC) 10 MG tablet TAKE 1 TABLET BY MOUTH EVERY DAY 30 tablet 3  . pantoprazole (PROTONIX) 20 MG tablet Take 1 tablet (20 mg total) by mouth daily. 30 tablet 3  . tamsulosin (FLOMAX) 0.4 MG CAPS capsule Take 1 capsule (0.4 mg total) by mouth daily after supper. 30 capsule 5  . amLODipine (NORVASC) 10 MG tablet TAKE 1 TABLET BY MOUTH EVERY DAY IN THE MORNING FOR HIGH BLOOD PRESSURE 30 tablet 3    No results found for this or any previous visit (from the past 48 hour(s)). No results found.  ROS  Blood pressure 136/86, pulse 74, temperature 97.8 F (36.6 C), temperature source Oral, resp. rate 16, height 5\' 10"  (1.778 m), weight 153 lb (69.4 kg), SpO2 100 %. Physical Exam  Constitutional:  Well-developed thin African-American male in NAD.  HENT:  Mouth/Throat: Oropharynx is clear and moist.  Patient is edentulous. He has upper and lower dental plates.  Eyes: Conjunctivae are normal. No scleral icterus.  Neck: No thyromegaly present.  Cardiovascular: Normal rate, regular rhythm and normal heart sounds.  No murmur heard. Respiratory: Effort normal and breath sounds normal.  GI: Soft. He exhibits no distension and no mass. There is no tenderness.  Musculoskeletal: He exhibits no edema.  Lymphadenopathy:    He has no cervical adenopathy.  Neurological: He is alert.  Skin: Skin is warm and dry.  He has clubbing.     Assessment/Plan Dysphagia and odynophagia. EGD with EGD and average risk screening colonoscopy.  Hildred Laser, MD 04/14/2016, 11:13 AM

## 2016-04-14 NOTE — Discharge Instructions (Signed)
Resume usual medications. Pantoprazole 40 mg by mouth 30 minutes before breakfast daily. Soft diet. No driving for 24 hours. Physician will call with biopsy results. Chest and abdominopelvic CT to be scheduled. Office will call.     Esophagogastroduodenoscopy, Care After Introduction Refer to this sheet in the next few weeks. These instructions provide you with information about caring for yourself after your procedure. Your health care provider may also give you more specific instructions. Your treatment has been planned according to current medical practices, but problems sometimes occur. Call your health care provider if you have any problems or questions after your procedure. What can I expect after the procedure? After the procedure, it is common to have:  A sore throat.  Nausea.  Bloating.  Dizziness.  Fatigue. Follow these instructions at home:  Do not eat or drink anything until the numbing medicine (local anesthetic) has worn off and your gag reflex has returned. You will know that the local anesthetic has worn off when you can swallow comfortably.  Do not drive for 24 hours if you received a medicine to help you relax (sedative).  If your health care provider took a tissue sample for testing during the procedure, make sure to get your test results. This is your responsibility. Ask your health care provider or the department performing the test when your results will be ready.  Keep all follow-up visits as told by your health care provider. This is important. Contact a health care provider if:  You cannot stop coughing.  You are not urinating.  You are urinating less than usual. Get help right away if:  You have trouble swallowing.  You cannot eat or drink.  You have throat or chest pain that gets worse.  You are dizzy or light-headed.  You faint.  You have nausea or vomiting.  You have chills.  You have a fever.  You have severe abdominal  pain.  You have black, tarry, or bloody stools. This information is not intended to replace advice given to you by your health care provider. Make sure you discuss any questions you have with your health care provider. Document Released: 04/26/2012 Document Revised: 10/16/2015 Document Reviewed: 04/03/2015  2017 Elsevier    Colonoscopy, Adult, Care After This sheet gives you information about how to care for yourself after your procedure. Your doctor may also give you more specific instructions. If you have problems or questions, call your doctor. Follow these instructions at home: General instructions  For the first 24 hours after the procedure:  Do not drive or use machinery.  Do not sign important documents.  Do not drink alcohol.  Do your daily activities more slowly than normal.  Eat foods that are soft and easy to digest.  Rest often.  Take over-the-counter or prescription medicines only as told by your doctor.  It is up to you to get the results of your procedure. Ask your doctor, or the department performing the procedure, when your results will be ready. To help cramping and bloating:  Try walking around.  Put heat on your belly (abdomen) as told by your doctor. Use a heat source that your doctor recommends, such as a moist heat pack or a heating pad.  Put a towel between your skin and the heat source.  Leave the heat on for 20-30 minutes.  Remove the heat if your skin turns bright red. This is especially important if you cannot feel pain, heat, or cold. You can get burned. Eating and  drinking  Drink enough fluid to keep your pee (urine) clear or pale yellow.  Return to your normal diet as told by your doctor. Avoid heavy or fried foods that are hard to digest.  Avoid drinking alcohol for as long as told by your doctor. Contact a doctor if:  You have blood in your poop (stool) 2-3 days after the procedure. Get help right away if:  You have more than a  small amount of blood in your poop.  You see large clumps of tissue (blood clots) in your poop.  Your belly is swollen.  You feel sick to your stomach (nauseous).  You throw up (vomit).  You have a fever.  You have belly pain that gets worse, and medicine does not help your pain. This information is not intended to replace advice given to you by your health care provider. Make sure you discuss any questions you have with your health care provider. Document Released: 06/12/2010 Document Revised: 02/02/2016 Document Reviewed: 02/02/2016 Elsevier Interactive Patient Education  2017 Else[employer] Inc[employer]    Hiatal Hernia A hiatal hernia occurs when part of your stomach slides above the muscle that separates your abdomen from your chest (diaphragm). You can be born with a hiatal hernia (congenital), or it may develop over time. In almost all cases of hiatal hernia, only the top part of the stomach pushes through.  Many people have a hiatal hernia with no symptoms. The larger the hernia, the more likely that you will have symptoms. In some cases, a hiatal hernia allows stomach acid to flow back into the tube that carries food from your mouth to your stomach (esophagus). This may cause heartburn symptoms. Severe heartburn symptoms may mean you have developed a condition called gastroesophageal reflux disease (GERD).  CAUSES  Hiatal hernias are caused by a weakness in the opening (hiatus) where your esophagus passes through your diaphragm to attach to the upper part of your stomach. You may be born with a weakness in your hiatus, or a weakness can develop. RISK FACTORS Older age is a major risk factor for a hiatal hernia. Anything that increases pressure on your diaphragm can also increase your risk of a hiatal hernia. This includes:  Pregnancy.  Excess weight.  Frequent constipation. SIGNS AND SYMPTOMS  People with a hiatal hernia often have no symptoms. If symptoms develop, they are almost always  caused by GERD. They may include:  Heartburn.  Belching.  Indigestion.  Trouble swallowing.  Coughing or wheezing.  Sore throat.  Hoarseness.  Chest pain. DIAGNOSIS  A hiatal hernia is sometimes found during an exam for another problem. Your health care provider may suspect a hiatal hernia if you have symptoms of GERD. Tests may be done to diagnose GERD. These may include:  X-rays of your stomach or chest.  An upper gastrointestinal (GI) series. This is an X-ray exam of your GI tract involving the use of a chalky liquid that you swallow. The liquid shows up clearly on the X-ray.  Endoscopy. This is a procedure to look into your stomach using a thin, flexible tube that has a tiny camera and light on the end of it. TREATMENT  If you have no symptoms, you may not need treatment. If you have symptoms, treatment may include:  Dietary and lifestyle changes to help reduce GERD symptoms.  Medicines. These may include:  Over-the-counter antacids.  Medicines that make your stomach empty more quickly.  Medicines that block the production of stomach acid (H2 blockers).  Stronger medicines to reduce stomach acid (proton pump inhibitors).  You may need surgery to repair the hernia if other treatments are not helping. HOME CARE INSTRUCTIONS   Take all medicines as directed by your health care provider.  Quit smoking, if you smoke.  Try to achieve and maintain a healthy body weight.  Eat frequent small meals instead of three large meals a day. This keeps your stomach from getting too full.  Eat slowly.  Do not lie down right after eating.  Do noteat 1-2 hours before bed.   Do not drink beverages with caffeine. These include cola, coffee, cocoa, and tea.  Do not drink alcohol.  Avoid foods that can make symptoms of GERD worse. These may include:  Fatty foods.  Citrus fruits.  Other foods and drinks that contain acid.  Avoid putting pressure on your belly.  Anything that puts pressure on your belly increases the amount of acid that may be pushed up into your esophagus.   Avoid bending over, especially after eating.  Raise the head of your bed by putting blocks under the legs. This keeps your head and esophagus higher than your stomach.  Do not wear tight clothing around your chest or stomach.  Try not to strain when having a bowel movement, when urinating, or when lifting heavy objects. SEEK MEDICAL CARE IF:  Your symptoms are not controlled with medicines or lifestyle changes.  You are having trouble swallowing.  You have coughing or wheezing that will not go away. SEEK IMMEDIATE MEDICAL CARE IF:  Your pain is getting worse.  Your pain spreads to your arms, neck, jaw, teeth, or back.  You have shortness of breath.  You sweat for no reason.  You feel sick to your stomach (nauseous) or vomit.  You vomit blood.  You have bright red blood in your stools.  You have black, tarry stools.  This information is not intended to replace advice given to you by your health care provider. Make sure you discuss any questions you have with your health care provider. Document Released: 07/31/2003 Document Revised: 09/01/2015 Document Reviewed: 04/27/2013 Elsevier Interactive Patient Education  2017 [employer].     Hemorrhoids Hemorrhoids are swollen veins in and around the rectum or anus. There are two types of hemorrhoids:  Internal hemorrhoids. These occur in the veins that are just inside the rectum. They may poke through to the outside and become irritated and painful.  External hemorrhoids. These occur in the veins that are outside of the anus and can be felt as a painful swelling or hard lump near the anus. Most hemorrhoids do not cause serious problems, and they can be managed with home treatments such as diet and lifestyle changes. If home treatments do not help your symptoms, procedures can be done to shrink or remove the  hemorrhoids. What are the causes? This condition is caused by increased pressure in the anal area. This pressure may result from various things, including:  Constipation.  Straining to have a bowel movement.  Diarrhea.  Pregnancy.  Obesity.  Sitting for long periods of time.  Heavy lifting or other activity that causes you to strain.  Anal sex. What are the signs or symptoms? Symptoms of this condition include:  Pain.  Anal itching or irritation.  Rectal bleeding.  Leakage of stool (feces).  Anal swelling.  One or more lumps around the anus. How is this diagnosed? This condition can often be diagnosed through a visual exam. Other exams or tests  may also be done, such as:  Examination of the rectal area with a gloved hand (digital rectal exam).  Examination of the anal canal using a small tube (anoscope).  A blood test, if you have lost a significant amount of blood.  A test to look inside the colon (sigmoidoscopy or colonoscopy). How is this treated? This condition can usually be treated at home. However, various procedures may be done if dietary changes, lifestyle changes, and other home treatments do not help your symptoms. These procedures can help make the hemorrhoids smaller or remove them completely. Some of these procedures involve surgery, and others do not. Common procedures include:  Rubber band ligation. Rubber bands are placed at the base of the hemorrhoids to cut off the blood supply to them.  Sclerotherapy. Medicine is injected into the hemorrhoids to shrink them.  Infrared coagulation. A type of light energy is used to get rid of the hemorrhoids.  Hemorrhoidectomy surgery. The hemorrhoids are surgically removed, and the veins that supply them are tied off.  Stapled hemorrhoidopexy surgery. A circular stapling device is used to remove the hemorrhoids and use staples to cut off the blood supply to them. Follow these instructions at home: Eating and  drinking  Eat foods that have a lot of fiber in them, such as whole grains, beans, nuts, fruits, and vegetables. Ask your health care provider about taking products that have added fiber (fiber supplements).  Drink enough fluid to keep your urine clear or pale yellow. Managing pain and swelling  Take warm sitz baths for 20 minutes, 3-4 times a day to ease pain and discomfort.  If directed, apply ice to the affected area. Using ice packs between sitz baths may be helpful.  Put ice in a plastic bag.  Place a towel between your skin and the bag.  Leave the ice on for 20 minutes, 2-3 times a day. General instructions  Take over-the-counter and prescription medicines only as told by your health care provider.  Use medicated creams or suppositories as told.  Exercise regularly.  Go to the bathroom when you have the urge to have a bowel movement. Do not wait.  Avoid straining to have bowel movements.  Keep the anal area dry and clean. Use wet toilet paper or moist towelettes after a bowel movement.  Do not sit on the toilet for long periods of time. This increases blood pooling and pain. Contact a health care provider if:  You have increasing pain and swelling that are not controlled by treatment or medicine.  You have uncontrolled bleeding.  You have difficulty having a bowel movement, or you are unable to have a bowel movement.  You have pain or inflammation outside the area of the hemorrhoids. This information is not intended to replace advice given to you by your health care provider. Make sure you discuss any questions you have with your health care provider. Document Released: 05/07/2000 Document Revised: 10/08/2015 Document Reviewed: 01/22/2015 Elsevier Interactive Patient Education  2017 [employer].

## 2016-04-14 NOTE — Op Note (Signed)
[facility] Patient Name: Stanley Jimenez Procedure Date: 04/14/2016 11:05 AM MRN: [id number] Date of Birth: 1955/09/02 Attending MD: Hildred Laser , MD CSN: [id number] Age: 60 Admit Type: Outpatient Procedure:                Upper GI endoscopy Indications:              Dysphagia, Odynophagia Providers:                Hildred Laser, MD, Otis Peak B. Sharon Seller, RN, Isabella Stalling, Technician Referring MD:             Norwood Levo. Simpson MD, MD Medicines:                Cetacaine spray, Meperidine 50 mg IV, Midazolam 6                            mg IV Complications:            No immediate complications. Estimated Blood Loss:     Estimated blood loss was minimal. Procedure:                Pre-Anesthesia Assessment:                           - Prior to the procedure, a History and Physical                            was performed, and patient medications and                            allergies were reviewed. The patient's tolerance of                            previous anesthesia was also reviewed. The risks                            and benefits of the procedure and the sedation                            options and risks were discussed with the patient.                            All questions were answered, and informed consent                            was obtained. Prior Anticoagulants: The patient has                            taken no previous anticoagulant or antiplatelet                            agents. ASA Grade Assessment: III - A patient with  severe systemic disease. After reviewing the risks                            and benefits, the patient was deemed in                            satisfactory condition to undergo the procedure.                           After obtaining informed consent, the endoscope was                            passed under direct vision. Throughout the   procedure, the patient's blood pressure, pulse, and                            oxygen saturations were monitored continuously. The                            [id number]) scope was introduced through the                            mouth, and advanced to the second part of duodenum.                            The upper GI endoscopy was accomplished without                            difficulty. The patient tolerated the procedure                            well. Scope In: 11:26:56 AM Scope Out: 11:34:43 AM Total Procedure Duration: 0 hours 7 minutes 47 seconds  Findings:      The proximal esophagus was normal.      One cratered esophageal ulcer with no bleeding and no stigmata of recent       bleeding was found 33 to 36 cm from the incisors. The lesion was 30 mm       in largest dimension. Biopsies were taken with a cold forceps for       histology.      The distal esophagus was normal.      The Z-line was regular and was found 39 cm from the incisors.      A 2 cm hiatal hernia was present.      The entire examined stomach was normal.      The duodenal bulb and second portion of the duodenum were normal. Impression:               - Normal proximal esophagus.                           - Non-bleeding esophageal ulcer. 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 Biopsied.                           - Normal distal 3 cm of esophageal mucosa distal to                            lesion.                           - Z-line regular, 39 cm from the incisors.                           - 2 cm hiatal hernia.                           - Normal stomach.                           - Normal duodenal bulb and second portion of the                            duodenum. Moderate Sedation:      Moderate (conscious) sedation was administered by the endoscopy nurse       and supervised by the endoscopist.  The following parameters were       monitored: oxygen saturation, heart rate, blood pressure, CO2       capnography and response to care. Total physician intraservice time was       14 minutes. Recommendation:           - Patient has a contact number available for                            emergencies. The signs and symptoms of potential                            delayed complications were discussed with the                            patient. Return to normal activities tomorrow.                            Written discharge instructions were provided to the                            patient.                           - Mechanical soft diet today.                           - Continue present medications.                           - Await pathology results.                           - Schedule chest and abdominopelvic CT with  contrast. Procedure Code(s):        --- Professional ---                           [phone], Esophagogastroduodenoscopy, flexible,                            transoral; with biopsy, single or multiple                           [id number], Moderate sedation services provided by the                            same physician or other qualified health care                            professional performing the diagnostic or                            therapeutic service that the sedation supports,                            requiring the presence of an independent trained                            observer to assist in the monitoring of the                            patient's level of consciousness and physiological                            status; initial 15 minutes of intraservice time,                            patient age 62 years or older Diagnosis Code(s):        --- Professional ---                           K22.10, Ulcer of esophagus without bleeding                           K44.9, Diaphragmatic hernia without obstruction or                             gangrene                           R13.10, Dysphagia, unspecified CPT copyright 2016 American [employer] Association. All rights reserved. The codes documented in this report are preliminary and upon coder review may  be revised to meet current compliance requirements. Hildred Laser, MD Hildred Laser, MD 04/14/2016 12:29:32 PM This report has been signed electronically. Number of Addenda: 0

## 2016-04-20 ENCOUNTER — Encounter (HOSPITAL_COMMUNITY): Payer: Self-pay | Admitting: Internal Medicine

## 2016-04-21 ENCOUNTER — Encounter (HOSPITAL_COMMUNITY): Payer: Self-pay | Admitting: Oncology

## 2016-04-21 ENCOUNTER — Other Ambulatory Visit (INDEPENDENT_AMBULATORY_CARE_PROVIDER_SITE_OTHER): Payer: Self-pay | Admitting: Internal Medicine

## 2016-04-21 ENCOUNTER — Encounter (HOSPITAL_COMMUNITY): Payer: Medicaid Other | Attending: Oncology | Admitting: Oncology

## 2016-04-21 VITALS — BP 128/71 | HR 68 | Temp 98.6°F | Resp 16 | Ht 70.5 in | Wt 147.0 lb

## 2016-04-21 DIAGNOSIS — Z87898 Personal history of other specified conditions: Secondary | ICD-10-CM

## 2016-04-21 DIAGNOSIS — Z720 Tobacco use: Secondary | ICD-10-CM

## 2016-04-21 DIAGNOSIS — F1010 Alcohol abuse, uncomplicated: Secondary | ICD-10-CM

## 2016-04-21 DIAGNOSIS — C61 Malignant neoplasm of prostate: Secondary | ICD-10-CM

## 2016-04-21 DIAGNOSIS — C159 Malignant neoplasm of esophagus, unspecified: Secondary | ICD-10-CM

## 2016-04-21 DIAGNOSIS — C155 Malignant neoplasm of lower third of esophagus: Secondary | ICD-10-CM | POA: Insufficient documentation

## 2016-04-21 DIAGNOSIS — Z8546 Personal history of malignant neoplasm of prostate: Secondary | ICD-10-CM | POA: Diagnosis not present

## 2016-04-21 DIAGNOSIS — F17200 Nicotine dependence, unspecified, uncomplicated: Secondary | ICD-10-CM

## 2016-04-21 HISTORY — DX: Malignant neoplasm of esophagus, unspecified: C15.9

## 2016-04-21 LAB — COMPREHENSIVE METABOLIC PANEL
ALBUMIN: 4.2 g/dL (ref 3.5–5.0)
ALT: 18 U/L (ref 17–63)
ANION GAP: 7 (ref 5–15)
AST: 19 U/L (ref 15–41)
Alkaline Phosphatase: 68 U/L (ref 38–126)
BILIRUBIN TOTAL: 0.5 mg/dL (ref 0.3–1.2)
BUN: 19 mg/dL (ref 6–20)
CO2: 26 mmol/L (ref 22–32)
Calcium: 9.2 mg/dL (ref 8.9–10.3)
Chloride: 104 mmol/L (ref 101–111)
Creatinine, Ser: 0.96 mg/dL (ref 0.61–1.24)
GFR calc Af Amer: >60 mL/min (ref >60)
GFR calc non Af Amer: >60 mL/min (ref >60)
GLUCOSE: 90 mg/dL (ref 65–99)
POTASSIUM: 4.1 mmol/L (ref 3.5–5.1)
SODIUM: 137 mmol/L (ref 135–145)
Total Protein: 7.6 g/dL (ref 6.5–8.1)

## 2016-04-21 LAB — CBC WITH DIFFERENTIAL/PLATELET
BASOS ABS: 0 10*3/uL (ref 0.0–0.1)
BASOS PCT: 0 %
EOS ABS: 0.2 10*3/uL (ref 0.0–0.7)
Eosinophils Relative: 2 %
HEMATOCRIT: 36.7 % — AB (ref 39.0–52.0)
HEMOGLOBIN: 12.2 g/dL — AB (ref 13.0–17.0)
Lymphocytes Relative: 24 %
Lymphs Abs: 2.2 10*3/uL (ref 0.7–4.0)
MCH: 29 pg (ref 26.0–34.0)
MCHC: 33.2 g/dL (ref 30.0–36.0)
MCV: 87.4 fL (ref 78.0–100.0)
MONO ABS: 0.9 10*3/uL (ref 0.1–1.0)
MONOS PCT: 10 %
NEUTROS ABS: 5.9 10*3/uL (ref 1.7–7.7)
NEUTROS PCT: 64 %
Platelets: 205 10*3/uL (ref 150–400)
RBC: 4.2 MIL/uL — ABNORMAL LOW (ref 4.22–5.81)
RDW: 15.5 % (ref 11.5–15.5)
WBC: 9.3 10*3/uL (ref 4.0–10.5)

## 2016-04-21 NOTE — Progress Notes (Signed)
[facility] Hematology/Oncology Consultation   Name: Stanley Jimenez      MRN: [id number]   Date: 04/21/2016 Time:5:13 PM   REFERRING PHYSICIAN:  Hildred Laser, MD (GI)  REASON FOR CONSULT:  Newly diagnosed poorly differentiated, invasive squamous cell carcinoma   DIAGNOSIS:  Invasive squamous cell carcinoma of esophagus.  HISTORY OF PRESENT ILLNESS:   Stanley Jimenez is a 60 y.o. male with a medical history significant for EtOHism, HTN, tobacco abuse, psoriasis, H/O V-tach who is referred to the [facility] for newly diagnosed poorly differentiated, invasive squamous cell carcinoma.  He was referred to Dr. Laural Golden, GI by his primary care provider, Dr. Moshe Cipro for dysphagia and odynophagia.  Dr. Laural Golden proceeded to perform EGD/Colonoscopy  Colonoscopy was unimpressive from an oncology perspective, BUT, EGD demonstrated a 3 cm cratered esophagus ulcer at 33-36 cm from the incisors that was biopsy proven to be squamous cell carcinoma.  "Why am I here?"  He reports that his phone is broke and he needs to get a new one.    He reports a good appetite, but an 8 lb weight loss.  He denies any nausea or vomiting.  He reports intermittent epigastric pain with eating.  This occurs intermittently.  He denies any chest pain.  He reports ongoing dysphagia, but he is still eating.   Review of Systems  Constitutional: Positive for weight loss. Negative for chills and fever.  HENT: Negative.   Eyes: Negative.  Negative for double vision.  Respiratory: Negative.  Negative for cough.   Cardiovascular: Negative.  Negative for chest pain and palpitations.  Gastrointestinal: Positive for abdominal pain. Negative for constipation, diarrhea, nausea and vomiting.  Genitourinary: Negative.  Negative for dysuria.  Musculoskeletal: Negative.  Negative for falls.  Skin: Negative.   Neurological: Negative.  Negative for weakness.  Endo/Heme/Allergies: Negative.     Psychiatric/Behavioral: Negative.   14 point review of systems was performed and is negative except as detailed under history of present illness and above    PAST MEDICAL HISTORY:   Past Medical History:  Diagnosis Date  . 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 Anxiety   . Arthritis   . Cirrhosis, alcoholic ([place])   . Clubbing of fingers    congenital  . Depression   . Full dentures   . History of PSVT (paroxysmal supraventricular tachycardia)    run of non-sustatined VT 03-10-2015 in setting of alcohol withdrawal in hospital  . History of seizure    03-08-2012  alcohol withdrawal  . History of thrombocytopenia    10/ 2013  in setting of alcohol withdrawal  . Hyperlipidemia   . Hypertension   . Prostate cancer [facility]) urologist-  dr dalhstedt/  oncologist- dr Tammi Klippel   T1c, Gleason 3+3,  PSA 8.89,  vol 27cc  . 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 Squamous cell carcinoma of esophagus ([place]) 04/21/2016    ALLERGIES: No Known Allergies    MEDICATIONS: I have reviewed the patient's current medications.    Current Outpatient Prescriptions on File Prior to Visit  Medication Sig Dispense Refill  . amLODipine (NORVASC) 10 MG tablet TAKE 1 TABLET BY MOUTH EVERY DAY IN THE MORNING FOR HIGH BLOOD PRESSURE 30 tablet 3  . pantoprazole (PROTONIX) 40 MG tablet Take 1 tablet (40 mg total) by mouth daily before breakfast. 30 tablet 5  . tamsulosin (FLOMAX) 0.4 MG CAPS capsule Take 1 capsule (0.4 mg total) by  mouth daily after supper. 30 capsule 5   No current facility-administered medications on file prior to visit.      PAST SURGICAL HISTORY Past Surgical History:  Procedure Laterality Date  . BIOPSY  04/14/2016   Procedure: BIOPSY;  Surgeon: Rogene Houston, MD;  [facility];  Service: Endoscopy;;  esophagus  . COLONOSCOPY N/A 04/14/2016   Procedure: COLONOSCOPY;  Surgeon: Rogene Houston, MD;  [facility];  Service: Endoscopy;  Laterality: N/A;  .  ESOPHAGOGASTRODUODENOSCOPY N/A 04/14/2016   Procedure: ESOPHAGOGASTRODUODENOSCOPY (EGD);  Surgeon: Rogene Houston, MD;  [facility];  Service: Endoscopy;  Laterality: N/A;  1:00  . NO PAST SURGERIES    . RADIOACTIVE SEED IMPLANT N/A 09/20/2014   Procedure: RADIOACTIVE SEED IMPLANT;  Surgeon: Franchot Gallo, MD;  Location: [facility];  Service: Urology;  Laterality: N/A;    77   seeds implanted no seeds found in bladder  . TRANSURETHRAL RESECTION OF PROSTATE      FAMILY HISTORY: Family History  Problem Relation Age of Onset  . Alcohol abuse Mother   . Diabetes Sister    Mother is deceased at the age of 53 due to complications of EtOHism. Father is deceased when the patient was 37 years old from unknown cause. He has 3 brothers and 3 sisters.  All are local except for 2 sisters who live in [place].   He has no children.   SOCIAL HISTORY: He reports a 1 ppd smoking history x 20+ years.  He quit drinking EtOH 2 years ago, but admits to a 6 year history of drinking daily both beer and liquor.  He does have a history of illicit abuse with Marijuana, this too he quit 2 years ago.  He works part-time as a Arts administrator.  He notes that he is on disability, but diagnosis for disability is unclear.  He is Holiness in religion.  He is never married.  No children.  He did not graduate [employer] and he reads a little, but admits he is not the best reader.  He can write.  Social History   Social History  . Marital status: Single    Spouse name: N/A  . Number of children: N/A  . Years of education: N/A   Social History Main Topics  . Smoking status: Current Every Day Smoker    Packs/day: 0.50    Years: 20.00    Types: Cigarettes  . Smokeless tobacco: Former Systems developer    Types: Chew  . Alcohol use No     Comment: quit drinking in October 2015. He says before this he says he stayed drunk every day.   . Drug use: No     Comment: last use october 2015  . Sexual  activity: Not Asked   Other Topics Concern  . None   Social History Narrative  . None    PERFORMANCE STATUS: The patient's performance status is 1 - Symptomatic but completely ambulatory  PHYSICAL EXAM: Most Recent Vital Signs: Blood pressure 128/71, pulse 68, temperature 98.6 F (37 C), temperature source Oral, resp. rate 16, height 5' 10.5" (1.791 m), weight 147 lb (66.7 kg), SpO2 100 %. General appearance: alert, appears older than stated age, cachectic, no distress and unaccompanied, chronically ill appearing Head: Normocephalic, without obvious abnormality, atraumatic Eyes: negative findings: lids and lashes normal, conjunctivae and sclerae normal and corneas clear Throat: normal findings: lips normal without lesions, buccal mucosa normal, tongue midline and normal and  oropharynx pink & moist without lesions or evidence of thrush and abnormal findings: dentition: upper and lower dentures Neck: no adenopathy, supple, symmetrical, trachea midline and thyroid not enlarged, symmetric, no tenderness/mass/nodules Lungs: clear to auscultation bilaterally and normal percussion bilaterally Heart: regular rate and rhythm, S1, S2 normal, no murmur, click, rub or gallop Abdomen: soft, non-tender; bowel sounds normal; no masses,  no organomegaly Extremities: extremities normal, atraumatic, no cyanosis or edema and clubbing of fingernails Skin: Skin color, texture, turgor normal. No rashes or lesions Lymph nodes: Cervical, supraclavicular, and axillary nodes normal. Neurologic: Grossly normal  LABORATORY DATA:  CBC    Component Value Date/Time   WBC 8.1 09/24/2015 0909   RBC 4.52 09/24/2015 0909   HGB 12.8 (L) 09/24/2015 0909   HCT 38.6 09/24/2015 0909   PLT 267 09/24/2015 0909   MCV 85.4 09/24/2015 0909   MCH 28.3 09/24/2015 0909   MCHC 33.2 09/24/2015 0909   RDW 14.7 09/24/2015 0909   LYMPHSABS 1.2 03/12/2014 1017   MONOABS 1.2 (H) 03/12/2014 1017   EOSABS 0.0 03/12/2014 1017    BASOSABS 0.1 03/12/2014 1017     Chemistry      Component Value Date/Time   NA 137 09/24/2015 0909   K 4.2 09/24/2015 0909   CL 102 09/24/2015 0909   CO2 26 09/24/2015 0909   BUN 16 09/24/2015 0909   CREATININE 1.01 09/24/2015 0909      Component Value Date/Time   CALCIUM 9.3 09/24/2015 0909   ALKPHOS 78 09/24/2015 0909   AST 13 09/24/2015 0909   ALT 14 09/24/2015 0909   BILITOT 0.5 09/24/2015 0909      RADIOGRAPHY: No results found.    CLINICAL DATA:  Alcoholic cirrhosis.  Question liver mass.  EXAM: ABDOMEN ULTRASOUND COMPLETE  COMPARISON:  None.  FINDINGS: Gallbladder: No gallstones or wall thickening visualized. No sonographic Murphy sign noted by sonographer.  Common bile duct: Diameter: 0.5 cm  Liver: No focal lesion identified. Echogenicity is somewhat increased. There is normal hepatopetal flow in the portal vein. No intrahepatic biliary ductal dilatation.  IVC: No abnormality visualized.  Pancreas: Visualized portion unremarkable.  Spleen: Size and appearance within normal limits.  Right Kidney: Length: 10.2 cm. Echogenicity within normal limits. No mass or hydronephrosis visualized.  Left Kidney: Length: 10.5 cm. Echogenicity within normal limits. No mass or hydronephrosis visualized.  Abdominal aorta: No aneurysm visualized.  Other findings: None.  IMPRESSION: Fatty infiltration of the liver without focal lesion.   Electronically Signed   By: Inge Rise M.D.   On: 02/13/2016 11:19   PATHOLOGY:   Diagnosis Esophagus, biopsy - INVASIVE POORLY DIFFERENTIATED SQUAMOUS CELL CARCINOMA. Microscopic Comment Dr. Gari Crown agrees. Case discusssed with Dr. Laural Golden was on 04/16/2016. (JDP:kh 04/16/16) Claudette Laws MD Pathologist, Electronic Signature (Case signed 04/16/2016)  ASSESSMENT/PLAN:   Squamous cell carcinoma of esophagus (HCC) H/O alcohol abuse Tobacco Use H/O prostate carcinoma  Invasive squamous cell  carcinoma of esophagus, incompletely staged at this time, at 33-36 cm from incisors. He is scheduled for CT CAP w contrast on 04/26/2016 by Dr. Laural Golden. If CT scans are negative for Stage IV disease, then we will pursue EUS for staging purposes. He notes difficulty getting to [place], but feels able with adequate notice.   Oncology history developed.  Problem list updated.  I personally reviewed and went over pathology results with the patient.  He is provided patient education regarding squamous cell carcinoma.  He is educated on risk factors for this cancer (EtOH and tobacco).  He is provided a patient booklet to review for esophageal cancer.  Smoking cessation was addressed.   Labs today: CBC diff, CMET.  I personally reviewed and went over laboratory results with the patient.  The results are noted within this dictation.    I have messaged CSW for needs assessment.  Nurse Navigator will introduce herself at follow-up appointment.  Due to concerns for lack of compliance, he will return on 04/27/2016 for follow-up (after scans).     ORDERS PLACED FOR THIS ENCOUNTER: Orders Placed This Encounter  Procedures  . CBC with Differential  . Comprehensive metabolic panel    MEDICATIONS PRESCRIBED THIS ENCOUNTER: Meds ordered this encounter  Medications  . DISCONTD: pantoprazole (PROTONIX) 20 MG tablet    Refill:  1    All questions were answered. The patient knows to call the clinic with any problems, questions or concerns. We can certainly see the patient much sooner if necessary.  This note is electronically signed by: Molli Hazard, MD  04/21/2016 5:13 PM

## 2016-04-21 NOTE — Assessment & Plan Note (Addendum)
Invasive squamous cell carcinoma of esophagus, incompletely staged at this time, at 33-36 cm from incisors.  Oncology history developed.  Problem list updated.  I personally reviewed and went over pathology results with the patient.  He is provided patient education regarding squamous cell carcinoma.  He is educated on risk factors for this cancer (EtOH and tobacco).  He is provided a patient booklet to review for esophageal cancer.  He is scheduled for CT CAP w contrast on 04/26/2016 by Dr. Laural Golden.  Labs today: CBC diff, CMET.  I personally reviewed and went over laboratory results with the patient.  The results are noted within this dictation.  If CT scans are negative for Stage IV disease, then we will pursue EUS for staging purposes.    I have messaged CSW for needs assessment.  Nurse Navigator will introduce herself at follow-up appointment.  Due to concerns for lack of compliance, he will return on 04/27/2016 for follow-up (after scans).

## 2016-04-21 NOTE — Patient Instructions (Signed)
[place] at [facility] Discharge Instructions  RECOMMENDATIONS MADE BY THE CONSULTANT AND ANY TEST RESULTS WILL BE SENT TO YOUR REFERRING PHYSICIAN.  You were seen today by Kirby Crigler PA-C. Labs drawn today. Return on 12/5 for follow up.   Thank you for choosing [place] at [facility] to provide your oncology and hematology care.  To afford each patient quality time with our provider, please arrive at least 15 minutes before your scheduled appointment time.   Beginning January 23rd 2017 lab work for the [facility] will be done in the  Main lab at [facility] on 1st floor. If you have a lab appointment with the [facility] please come in thru the  Main Entrance and check in at the main information desk  You need to re-schedule your appointment should you arrive 10 or more minutes late.  We strive to give you quality time with our providers, and arriving late affects you and other patients whose appointments are after yours.  Also, if you no show three or more times for appointments you may be dismissed from the clinic at the providers discretion.     Again, thank you for choosing [facility].  Our hope is that these requests will decrease the amount of time that you wait before being seen by our physicians.       _____________________________________________________________  Should you have questions after your visit to [facility], please contact our office at [phone] between the hours of 8:30 a.m. and 4:30 p.m.  Voicemails left after 4:30 p.m. will not be returned until the following business day.  For prescription refill requests, have your pharmacy contact our office.         Resources For Cancer Patients and their Caregivers ? American Cancer Society: Can assist with transportation, wigs, general needs, runs Look Good Feel Better.        775-505-0174 ? Cancer Care: Provides financial  assistance, online support groups, medication/co-pay assistance.  1-800-813-HOPE 412-822-7934) ? [place] Assists [place] cancer patients and their families through emotional , educational and financial support.  708 304 2137 ? [place] DSS Where to apply for food stamps, Medicaid and utility assistance. 260-564-2366 ? [facility]: Transportation to medical appointments. 820-780-5692 ? Social Security Administration: May apply for disability if have a Stage IV cancer. 4195437760 317-064-8993 ? [employer], Disability and Transit Services: Assists with nutrition, care and transit needs. [place] Support Programs: @10RELATIVEDAYS @ > Cancer Support Group  2nd Tuesday of the month 1pm-2pm, Journey Room  > Creative Journey  3rd Tuesday of the month 1130am-1pm, Journey Room  > Look Good Feel Better  1st Wednesday of the month 10am-12 noon, Journey Room (Call [place] to register [phone])

## 2016-04-26 ENCOUNTER — Ambulatory Visit (HOSPITAL_COMMUNITY)
Admission: RE | Admit: 2016-04-26 | Discharge: 2016-04-26 | Disposition: A | Discharge status: Home / Self Care | Payer: Medicaid Other | Source: Ambulatory Visit | Attending: Internal Medicine | Admitting: Internal Medicine

## 2016-04-26 DIAGNOSIS — C159 Malignant neoplasm of esophagus, unspecified: Secondary | ICD-10-CM

## 2016-04-26 DIAGNOSIS — Z9889 Other specified postprocedural states: Secondary | ICD-10-CM | POA: Diagnosis not present

## 2016-04-26 DIAGNOSIS — Z8546 Personal history of malignant neoplasm of prostate: Secondary | ICD-10-CM | POA: Diagnosis not present

## 2016-04-26 DIAGNOSIS — I2510 Atherosclerotic heart disease of native coronary artery without angina pectoris: Secondary | ICD-10-CM | POA: Diagnosis not present

## 2016-04-26 DIAGNOSIS — Z8501 Personal history of malignant neoplasm of esophagus: Secondary | ICD-10-CM | POA: Diagnosis not present

## 2016-04-26 DIAGNOSIS — I700 Atherosclerosis of aorta: Secondary | ICD-10-CM | POA: Insufficient documentation

## 2016-04-26 DIAGNOSIS — Z08 Encounter for follow-up examination after completed treatment for malignant neoplasm: Secondary | ICD-10-CM | POA: Diagnosis not present

## 2016-04-26 MED ORDER — IOPAMIDOL (ISOVUE-300) INJECTION 61%
100.0000 mL | Freq: Once | INTRAVENOUS | Status: AC | PRN
  Administered 2016-04-26: 100 mL via INTRAVENOUS

## 2016-04-27 ENCOUNTER — Encounter (HOSPITAL_COMMUNITY): Payer: Medicaid Other | Attending: Oncology | Admitting: Oncology

## 2016-04-27 ENCOUNTER — Encounter (HOSPITAL_COMMUNITY): Payer: Self-pay | Admitting: Oncology

## 2016-04-27 ENCOUNTER — Encounter: Payer: Self-pay | Admitting: *Deleted

## 2016-04-27 DIAGNOSIS — C159 Malignant neoplasm of esophagus, unspecified: Secondary | ICD-10-CM | POA: Diagnosis not present

## 2016-04-27 NOTE — Assessment & Plan Note (Addendum)
Invasive squamous cell carcinoma of esophagus, at 33-36 cm from incisors.  Oncology history updated.  I personally reviewed and went over radiographic studies with the patient.  The results are noted within this dictation.  CT imaging is reviewed.  Based upon these results, I have messaged Dr. Ardis Hughs about performing an EUS for staging.    CSW has met with the patient today.  Nurse Navigator, Anderson Malta has met with him today.  He has a new cell phone: [phone] Sister's # is [phone]  He will return in 2 weeks for follow-up, sooner based upon Dr. Eugenia Pancoast recommendations.

## 2016-04-27 NOTE — Progress Notes (Signed)
Stanley Nakayama, MD [address] [place] [address]  Squamous cell carcinoma of esophagus [facility])  CURRENT THERAPY: Staging  INTERVAL HISTORY: GEDALIA Jimenez 60 y.o. 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    Squamous cell carcinoma of esophagus (HCC)   04/14/2016 Procedure    EGD by Dr. Laural Golden.      04/16/2016 Pathology Results    Esophagus, biopsy - INVASIVE POORLY DIFFERENTIATED SQUAMOUS CELL CARCINOMA.      04/26/2016 Imaging    CT CAP- 1. Prominent gastroesophageal junction without discrete esophageal mass. Notably, there is also a borderline enlarged enhancing lymph node in the gastrohepatic ligament immediately adjacent to the gastroesophageal junction, which is nonspecific, but could indicate early nodal disease. 2. The small cluster of low-attenuation lesions in segment 7 of the liver are too small to characterize. Statistically, these are likely to represent cysts. If there is clinical concern for metastatic disease to the liver, these could be further characterized with MRI of the abdomen with and without IV gadolinium at this time. 3. No other potential sites of metastatic disease noted elsewhere in the chest, abdomen or pelvis.       He is doing well.  He denies any complaints today.  Weight is stable.   He is here today to review imaging and medical oncology recommendations moving forward.  Review of Systems  Constitutional: Negative.  Negative for chills, fever and weight loss.  HENT: Negative.   Eyes: Negative.   Respiratory: Negative.  Negative for cough.   Cardiovascular: Negative.  Negative for chest pain.  Gastrointestinal: Negative.  Negative for constipation, diarrhea, nausea and vomiting.  Genitourinary: Negative.   Musculoskeletal: Negative.   Skin: Negative.   Neurological: Negative.  Negative for weakness.  Endo/Heme/Allergies: Negative.     Psychiatric/Behavioral: Negative.     Past Medical History:  Diagnosis Date  . 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 Anxiety   . Arthritis   . Cirrhosis, alcoholic ([place])   . Clubbing of fingers    congenital  . Depression   . Full dentures   . History of PSVT (paroxysmal supraventricular tachycardia)    run of non-sustatined VT 03-10-2015 in setting of alcohol withdrawal in hospital  . History of seizure    03-08-2012  alcohol withdrawal  . History of thrombocytopenia    10/ 2013  in setting of alcohol withdrawal  . Hyperlipidemia   . Hypertension   . Prostate cancer [facility]) urologist-  dr dalhstedt/  oncologist- dr Tammi Klippel   T1c, Gleason 3+3,  PSA 8.89,  vol 27cc  . 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 Squamous cell carcinoma of esophagus ([place]) 04/21/2016    Past Surgical History:  Procedure Laterality Date  . BIOPSY  04/14/2016   Procedure: BIOPSY;  Surgeon: Rogene Houston, MD;  [facility];  Service: Endoscopy;;  esophagus  . COLONOSCOPY N/A 04/14/2016   Procedure: COLONOSCOPY;  Surgeon: Rogene Houston, MD;  [facility];  Service: Endoscopy;  Laterality: N/A;  . ESOPHAGOGASTRODUODENOSCOPY N/A 04/14/2016   Procedure: ESOPHAGOGASTRODUODENOSCOPY (EGD);  Surgeon: Rogene Houston, MD;  [facility];  Service: Endoscopy;  Laterality: N/A;  1:00  . NO PAST SURGERIES    . RADIOACTIVE SEED IMPLANT N/A 09/20/2014   Procedure: RADIOACTIVE SEED IMPLANT;  Surgeon: Franchot Gallo, MD;  Location: [facility];  Service: Urology;  Laterality: N/A;    77   seeds implanted no seeds found in bladder  . TRANSURETHRAL RESECTION OF PROSTATE      Family History  Problem Relation Age of Onset  . Alcohol abuse Mother   . Diabetes Sister     Social History   Social History  . Marital status: Single    Spouse name: N/A  . Number of children: N/A  . Years of education: N/A   Social History Main Topics  . Smoking status:  Current Every Day Smoker    Packs/day: 0.50    Years: 20.00    Types: Cigarettes  . Smokeless tobacco: Former Systems developer    Types: Chew  . Alcohol use No     Comment: quit drinking in October 2015. He says before this he says he stayed drunk every day.   . Drug use: No     Comment: last use october 2015  . Sexual activity: Not Asked   Other Topics Concern  . None   Social History Narrative  . None     PHYSICAL EXAMINATION  ECOG PERFORMANCE STATUS: 1 - Symptomatic but completely ambulatory  Vitals:   04/27/16 1130  BP: 126/70  Pulse: 86  Resp: 16  Temp: 97.9 F (36.6 C)    GENERAL:alert, no distress, comfortable, cooperative, smiling and unaccompanied  SKIN: skin color, texture, turgor are normal, no rashes or significant lesions HEAD: Normocephalic, No masses, lesions, tenderness or abnormalities EYES: normal, EOMI, Conjunctiva are pink and non-injected EARS: External ears normal OROPHARYNX:lips, buccal mucosa, and tongue normal and mucous membranes are moist  NECK: supple, trachea midline LYMPH:  no palpable lymphadenopathy BREAST:not examined LUNGS: clear to auscultation  HEART: regular rate & rhythm ABDOMEN:abdomen soft and normal bowel sounds BACK: Back symmetric, no curvature. EXTREMITIES:less then 2 second capillary refill, no joint deformities, effusion, or inflammation, no skin discoloration, no cyanosis  NEURO: alert & oriented x 3 with fluent speech, no focal motor/sensory deficits, gait normal   LABORATORY DATA: CBC    Component Value Date/Time   WBC 9.3 04/21/2016 1649   RBC 4.20 (L) 04/21/2016 1649   HGB 12.2 (L) 04/21/2016 1649   HCT 36.7 (L) 04/21/2016 1649   PLT 205 04/21/2016 1649   MCV 87.4 04/21/2016 1649   MCH 29.0 04/21/2016 1649   MCHC 33.2 04/21/2016 1649   RDW 15.5 04/21/2016 1649   LYMPHSABS 2.2 04/21/2016 1649   MONOABS 0.9 04/21/2016 1649   EOSABS 0.2 04/21/2016 1649   BASOSABS 0.0 04/21/2016 1649      Chemistry      Component  Value Date/Time   NA 137 04/21/2016 1649   K 4.1 04/21/2016 1649   CL 104 04/21/2016 1649   CO2 26 04/21/2016 1649   BUN 19 04/21/2016 1649   CREATININE 0.96 04/21/2016 1649   CREATININE 1.01 09/24/2015 0909      Component Value Date/Time   CALCIUM 9.2 04/21/2016 1649   ALKPHOS 68 04/21/2016 1649   AST 19 04/21/2016 1649   ALT 18 04/21/2016 1649   BILITOT 0.5 04/21/2016 1649        PENDING LABS:   RADIOGRAPHIC STUDIES:  Ct Chest W Contrast  Result Date: 04/26/2016 CLINICAL DATA:  60 year old male with history of esophageal cancer. Followup study. Prior history of prostate cancer diagnosed 2 years ago, status post surgery. EXAM: CT CHEST, ABDOMEN, AND PELVIS WITH CONTRAST TECHNIQUE: Multidetector CT imaging of the chest, abdomen and pelvis was performed following the standard protocol during bolus administration of  intravenous contrast. CONTRAST:  170m ISOVUE-300 IOPAMIDOL (ISOVUE-300) INJECTION 61% COMPARISON:  No priors. FINDINGS: CT CHEST FINDINGS Cardiovascular: Heart size is normal. There is no significant pericardial fluid, thickening or pericardial calcification. There is aortic atherosclerosis, as well as atherosclerosis of the great vessels of the mediastinum and the coronary arteries, including calcified atherosclerotic plaque in the left main, left anterior descending, left circumflex and right coronary arteries. Mediastinum/Nodes: No pathologically enlarged mediastinal or hilar lymph nodes. There is some amorphous thickening of the gastroesophageal junction, but no discrete esophageal mass is confidently identified by CT examination. No axillary lymphadenopathy. Lungs/Pleura: No suspicious appearing pulmonary nodules or masses. No acute consolidative airspace disease. No pleural effusions. Musculoskeletal: There are no aggressive appearing lytic or blastic lesions noted in the visualized portions of the skeleton. CT ABDOMEN PELVIS FINDINGS Hepatobiliary: There is a cluster of  sub cm low-attenuation lesions in the segment 7 of the liver which are too small to characterize on today's examination. No larger more suspicious appearing hepatic lesions are noted. No intra or extrahepatic biliary ductal dilatation. Gallbladder is normal in appearance. Pancreas: No pancreatic mass. No pancreatic ductal dilatation. No pancreatic or peripancreatic fluid or inflammatory changes. Spleen: Unremarkable. Adrenals/Urinary Tract: Several sub cm low-attenuation lesions are noted in the kidneys bilaterally, too small to definitively characterize, but statistically likely to represent tiny cysts. Bilateral adrenal glands are normal in appearance. No hydroureteronephrosis. Urinary bladder is normal in appearance. Stomach/Bowel: Thickening of the gastroesophageal junction is noted, without a discrete mass. Remainder of the stomach is otherwise normal in appearance. No pathologic dilatation of small bowel or colon. Normal appendix. Vascular/Lymphatic: Aortic atherosclerosis, without evidence of aneurysm or dissection in the abdominal or pelvic vasculature. There is a prominent borderline enlarged lymph node in the gastrohepatic ligament (image 53 of series 2) adjacent to the prominent gastroesophageal junction, which is nonspecific, but concerning for potential nodal disease. No other lymphadenopathy is confidently identified elsewhere in the abdomen or pelvis. Reproductive: Brachytherapy implants throughout the prostate gland. Seminal vesicles are unremarkable in appearance. Other: No significant volume of ascites.  No pneumoperitoneum. Musculoskeletal: There are no aggressive appearing lytic or blastic lesions noted in the visualized portions of the skeleton. IMPRESSION: 1. Prominent gastroesophageal junction without discrete esophageal mass. Notably, there is also a borderline enlarged enhancing lymph node in the gastrohepatic ligament immediately adjacent to the gastroesophageal junction, which is  nonspecific, but could indicate early nodal disease. 2. The small cluster of low-attenuation lesions in segment 7 of the liver are too small to characterize. Statistically, these are likely to represent cysts. If there is clinical concern for metastatic disease to the liver, these could be further characterized with MRI of the abdomen with and without IV gadolinium at this time. 3. No other potential sites of metastatic disease noted elsewhere in the chest, abdomen or pelvis. 4. Aortic atherosclerosis, in addition to left main and 3 vessel coronary artery disease. Please note that although the presence of coronary artery calcium documents the presence of coronary artery disease, the severity of this disease and any potential stenosis cannot be assessed on this non-gated CT examination. Assessment for potential risk factor modification, dietary therapy or pharmacologic therapy may be warranted, if clinically indicated. 5. Postprocedural changes of prostate brachytherapy. 6. Additional incidental findings, as above. Electronically Signed   By: DVinnie LangtonM.D.   On: 04/26/2016 12:43   Ct Abdomen Pelvis W Contrast  Result Date: 04/26/2016 CLINICAL DATA:  60year old male with history of esophageal cancer. Followup study. Prior history  of prostate cancer diagnosed 2 years ago, status post surgery. EXAM: CT CHEST, ABDOMEN, AND PELVIS WITH CONTRAST TECHNIQUE: Multidetector CT imaging of the chest, abdomen and pelvis was performed following the standard protocol during bolus administration of intravenous contrast. CONTRAST:  125m ISOVUE-300 IOPAMIDOL (ISOVUE-300) INJECTION 61% COMPARISON:  No priors. FINDINGS: CT CHEST FINDINGS Cardiovascular: Heart size is normal. There is no significant pericardial fluid, thickening or pericardial calcification. There is aortic atherosclerosis, as well as atherosclerosis of the great vessels of the mediastinum and the coronary arteries, including calcified atherosclerotic  plaque in the left main, left anterior descending, left circumflex and right coronary arteries. Mediastinum/Nodes: No pathologically enlarged mediastinal or hilar lymph nodes. There is some amorphous thickening of the gastroesophageal junction, but no discrete esophageal mass is confidently identified by CT examination. No axillary lymphadenopathy. Lungs/Pleura: No suspicious appearing pulmonary nodules or masses. No acute consolidative airspace disease. No pleural effusions. Musculoskeletal: There are no aggressive appearing lytic or blastic lesions noted in the visualized portions of the skeleton. CT ABDOMEN PELVIS FINDINGS Hepatobiliary: There is a cluster of sub cm low-attenuation lesions in the segment 7 of the liver which are too small to characterize on today's examination. No larger more suspicious appearing hepatic lesions are noted. No intra or extrahepatic biliary ductal dilatation. Gallbladder is normal in appearance. Pancreas: No pancreatic mass. No pancreatic ductal dilatation. No pancreatic or peripancreatic fluid or inflammatory changes. Spleen: Unremarkable. Adrenals/Urinary Tract: Several sub cm low-attenuation lesions are noted in the kidneys bilaterally, too small to definitively characterize, but statistically likely to represent tiny cysts. Bilateral adrenal glands are normal in appearance. No hydroureteronephrosis. Urinary bladder is normal in appearance. Stomach/Bowel: Thickening of the gastroesophageal junction is noted, without a discrete mass. Remainder of the stomach is otherwise normal in appearance. No pathologic dilatation of small bowel or colon. Normal appendix. Vascular/Lymphatic: Aortic atherosclerosis, without evidence of aneurysm or dissection in the abdominal or pelvic vasculature. There is a prominent borderline enlarged lymph node in the gastrohepatic ligament (image 53 of series 2) adjacent to the prominent gastroesophageal junction, which is nonspecific, but concerning for  potential nodal disease. No other lymphadenopathy is confidently identified elsewhere in the abdomen or pelvis. Reproductive: Brachytherapy implants throughout the prostate gland. Seminal vesicles are unremarkable in appearance. Other: No significant volume of ascites.  No pneumoperitoneum. Musculoskeletal: There are no aggressive appearing lytic or blastic lesions noted in the visualized portions of the skeleton. IMPRESSION: 1. Prominent gastroesophageal junction without discrete esophageal mass. Notably, there is also a borderline enlarged enhancing lymph node in the gastrohepatic ligament immediately adjacent to the gastroesophageal junction, which is nonspecific, but could indicate early nodal disease. 2. The small cluster of low-attenuation lesions in segment 7 of the liver are too small to characterize. Statistically, these are likely to represent cysts. If there is clinical concern for metastatic disease to the liver, these could be further characterized with MRI of the abdomen with and without IV gadolinium at this time. 3. No other potential sites of metastatic disease noted elsewhere in the chest, abdomen or pelvis. 4. Aortic atherosclerosis, in addition to left main and 3 vessel coronary artery disease. Please note that although the presence of coronary artery calcium documents the presence of coronary artery disease, the severity of this disease and any potential stenosis cannot be assessed on this non-gated CT examination. Assessment for potential risk factor modification, dietary therapy or pharmacologic therapy may be warranted, if clinically indicated. 5. Postprocedural changes of prostate brachytherapy. 6. Additional incidental findings, as above. Electronically  Signed   By: Vinnie Langton M.D.   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  Oncology  history updated.  I personally reviewed and went over radiographic studies with the patient.  The results are noted within this dictation.  CT imaging is reviewed.  Based upon these results, I have messaged Dr. Ardis Hughs about performing an EUS for staging.    CSW has met with the patient today.  Nurse Navigator, Anderson Malta has met with him today.  He has a new cell phone: [phone] Sister's # is [phone]  He will return in 2 weeks for follow-up, sooner based upon Dr. Eugenia Pancoast recommendations.   ORDERS PLACED FOR THIS ENCOUNTER: No orders of the defined types were placed in this encounter.   MEDICATIONS PRESCRIBED THIS ENCOUNTER: No orders of the defined types were placed in this encounter.   THERAPY PLAN:  Suspect EUS for staging purposes.  All questions were answered. The patient knows to call the clinic with any problems, questions or concerns. We can certainly see the patient much sooner if necessary.  Patient and plan discussed with Dr. Ancil Linsey and she is in agreement with the aforementioned.   This note is electronically signed by: Doy Mince 04/27/2016 12:24 PM

## 2016-04-27 NOTE — Progress Notes (Signed)
[place] Clinical Social Work  Clinical Social Work was referred by PA for assessment of psychosocial needs. Clinical Social Worker met with patient at [facility] prior to his appointment to offer support and assess for needs.  CSW introduced self and explained role of CSW.   Pt reports to live alone and has his sister close by for help with transportation as needed. Pt reports he receives SSI for income and gets $130 a month in food stamps. He reports he is able to pay all his bills; rent and utilities with his SSI payment. He states he always stay current on his bills and was able to continue to stay current when he was going through treatment for ETOH abuse several years ago. Stanley Jimenez reports he attended [facility] a few years ago and found this very helpful to him. Pt denies current issues with alcohol use. He is aware of how to use [employer] for additional transportation assistance as needed. His sister is his biggest support. Pt was slightly anxious today as he was awaiting to meet with PA and was eager to know "what was going on".  CSW provided pt with info sheet about CSW and encouraged pt to reach out as needed.     Clinical Social Work interventions: Supportive listening Resource education  Stanley Jimenez, [place] Tuesdays   [phone]

## 2016-04-27 NOTE — Progress Notes (Signed)
Met with pt face to face.  Introduced myself and explain a little bit about my role as the patient navigator.  Pt given a card with all my information on it.  Told pt to call if they had any questions or concerns.  Pt verbalized understanding.   Referral to Dr Ardis Hughs.  I will monitor to make sure his biopsy is back prior to coming in for office visit.

## 2016-04-27 NOTE — Patient Instructions (Addendum)
[place] at [facility] Discharge Instructions  RECOMMENDATIONS MADE BY THE CONSULTANT AND ANY TEST RESULTS WILL BE SENT TO YOUR REFERRING PHYSICIAN.  Exam with Robynn Pane, PA.   He is referring you to Dr. Ardis Hughs. Return in 2 weeks for follow up.    Thank you for choosing [place] at [facility] to provide your oncology and hematology care.  To afford each patient quality time with our provider, please arrive at least 15 minutes before your scheduled appointment time.   Beginning January 23rd 2017 lab work for the [facility] will be done in the  Main lab at [facility] on 1st floor. If you have a lab appointment with the [facility] please come in thru the  Main Entrance and check in at the main information desk  You need to re-schedule your appointment should you arrive 10 or more minutes late.  We strive to give you quality time with our providers, and arriving late affects you and other patients whose appointments are after yours.  Also, if you no show three or more times for appointments you may be dismissed from the clinic at the providers discretion.     Again, thank you for choosing [facility].  Our hope is that these requests will decrease the amount of time that you wait before being seen by our physicians.       _____________________________________________________________  Should you have questions after your visit to [facility], please contact our office at [phone] between the hours of 8:30 a.m. and 4:30 p.m.  Voicemails left after 4:30 p.m. will not be returned until the following business day.  For prescription refill requests, have your pharmacy contact our office.         Resources For Cancer Patients and their Caregivers ? American Cancer Society: Can assist with transportation, wigs, general needs, runs Look Good Feel Better.        (334)353-8379 ? Cancer Care: Provides  financial assistance, online support groups, medication/co-pay assistance.  1-800-813-HOPE 434 055 6986) ? [facility] Assists [place] cancer patients and their families through emotional , educational and financial support.  571 673 1728 ? [place] DSS Where to apply for food stamps, Medicaid and utility assistance. 415 219 0019 ? [employer]: Transportation to medical appointments. 781-269-3775 ? Social Security Administration: May apply for disability if have a Stage IV cancer. 586-180-2293 857-130-2389 ? [employer], Disability and Transit Services: Assists with nutrition, care and transit needs. [place] Support Programs: @10RELATIVEDAYS @ > Cancer Support Group  2nd Tuesday of the month 1pm-2pm, Journey Room  > Creative Journey  3rd Tuesday of the month 1130am-1pm, Journey Room  > Look Good Feel Better  1st Wednesday of the month 10am-12 noon, Journey Room (Call [place] to register [phone])

## 2016-04-28 ENCOUNTER — Other Ambulatory Visit (HOSPITAL_COMMUNITY): Payer: Self-pay | Admitting: Oncology

## 2016-04-28 DIAGNOSIS — C159 Malignant neoplasm of esophagus, unspecified: Secondary | ICD-10-CM

## 2016-04-29 ENCOUNTER — Other Ambulatory Visit: Payer: Self-pay

## 2016-04-29 ENCOUNTER — Telehealth: Payer: Self-pay | Admitting: Gastroenterology

## 2016-04-29 DIAGNOSIS — C159 Malignant neoplasm of esophagus, unspecified: Secondary | ICD-10-CM

## 2016-04-29 NOTE — Telephone Encounter (Signed)
FYI:  EUS scheduled, pt instructed and medications reviewed.  Patient instructions mailed to home.  Patient to call with any questions or concerns.

## 2016-04-29 NOTE — Telephone Encounter (Signed)
-----   Message from Baird Cancer, PA-C sent at 04/28/2016  5:31 PM EST ----- Thank you for your advice.  After review of the data, we are going to set him up for a PET scan at [place] get him to see you for EUS and complete staging.  This, of course, will require 2 trips to [place].  We will see how we can best coordinate transportation for the patient.  I have asked our scheduler to send you an official referral for an EUS.  Maybe you can help with getting him in with you for an EUS.  Let me know how I can help.  Thanks  TK  ----- Message ----- From: Milus Banister, MD Sent: 04/28/2016   7:26 AM To: Baird Cancer, PA-C  Tom, Hard to know if the lymphnodes are malignant by the CT scan.  I'm happy to go ahead with EUS.  If it is easier for him to get to a PET scan that could also help with nodal disease question.  I don't know about PET availability nearer to him though.  Let me know  DJ  ----- Message ----- From: Baird Cancer, PA-C Sent: 04/27/2016  11:52 AM To: Milus Banister, MD  Good morning,  I saw this patient in consultation last week after EGD by Dr. Laural Golden was positive for esophageal cancer.  He had CT imaging performed yesterday.  Can you review these and see if he needs an EUS?  CT imaging was performed because this guy has SEVERE transportation issues.  1. If you think that gastrohepatic ligament lymph node is disease, then does he need an EUS? 2. If he does need an EUS, we can provide him transportation 1 time only to his appointment.  Thoughts?  Thanks in advance.  Kirby Crigler, PA-C

## 2016-04-29 NOTE — Telephone Encounter (Signed)
Derry Skill work on getting him in.  Thanks    Patty, He needs upper EUS, radial +/- linear for esophageal cancer staging, + MAC, next available EUS Thursday.  See below, he has difficulty with transportation.  Radonna Ricker

## 2016-05-03 ENCOUNTER — Telehealth (HOSPITAL_COMMUNITY): Payer: Self-pay | Admitting: Emergency Medicine

## 2016-05-03 NOTE — Telephone Encounter (Signed)
Pt called and wanted to verify appts.  05/07/2016 he has  his PET scan at [facility].  Then on the 05/13/2016 he has his follow up appt with [place].  He sees Dr Ardis Hughs on 05/27/2016.

## 2016-05-06 ENCOUNTER — Encounter (HOSPITAL_COMMUNITY): Payer: Self-pay | Admitting: Oncology

## 2016-05-06 ENCOUNTER — Other Ambulatory Visit (HOSPITAL_COMMUNITY): Payer: Self-pay | Admitting: Oncology

## 2016-05-06 ENCOUNTER — Telehealth (HOSPITAL_COMMUNITY): Payer: Self-pay | Admitting: Oncology

## 2016-05-06 NOTE — Telephone Encounter (Signed)
Peer to peer completed for PET imaging.  Approved: [id number]  Doy Mince 05/06/2016 2:27 PM

## 2016-05-07 ENCOUNTER — Encounter (HOSPITAL_COMMUNITY)
Admission: RE | Admit: 2016-05-07 | Discharge: 2016-05-07 | Disposition: A | Discharge status: Home / Self Care | Payer: Medicaid Other | Source: Ambulatory Visit | Attending: Oncology | Admitting: Oncology

## 2016-05-07 DIAGNOSIS — C159 Malignant neoplasm of esophagus, unspecified: Secondary | ICD-10-CM

## 2016-05-07 LAB — GLUCOSE, CAPILLARY: Glucose-Capillary: 87 mg/dL (ref 65–99)

## 2016-05-07 MED ORDER — FLUDEOXYGLUCOSE F - 18 (FDG) INJECTION: 7.1000 | Freq: Once | INTRAVENOUS | Status: DC | PRN

## 2016-05-09 ENCOUNTER — Encounter (HOSPITAL_COMMUNITY): Payer: Self-pay | Admitting: Oncology

## 2016-05-11 ENCOUNTER — Ambulatory Visit (INDEPENDENT_AMBULATORY_CARE_PROVIDER_SITE_OTHER): Payer: Medicaid Other | Admitting: Urology

## 2016-05-11 ENCOUNTER — Telehealth: Payer: Self-pay

## 2016-05-11 DIAGNOSIS — C61 Malignant neoplasm of prostate: Secondary | ICD-10-CM

## 2016-05-11 NOTE — Telephone Encounter (Signed)
-----   Message from Milus Banister, MD sent at 05/11/2016  4:31 PM EST ----- Got it, thanks  Refael Fulop, You can cancel his upcoming EUS.   Thanks  dj   ----- Message ----- From: Baird Cancer, PA-C Sent: 05/11/2016   4:01 PM To: Milus Banister, MD  We agree with you.  EUS will not provide additional information at this time.  Thank you.  This will save the patient another trip to [place].  TK ----- Message ----- From: Milus Banister, MD Sent: 05/11/2016   7:34 AM To: Baird Cancer, PA-C  Tom, I think the PET findings is pretty good indication that he has node positive disease.  I don't know that EUS staging will alter management at this point and so it is probably not necessary.  If Dr. Whitney Muse would still like the EUS information I'm happy to keep him on schedule (currently on for Jan 4th).  Thanks dj ----- Message ----- From: Baird Cancer, PA-C Sent: 05/10/2016   6:03 PM To: Milus Banister, MD  Good evening,  We discussed this patient in past with newly diagnosed esophageal cancer.  He did have a gastrohepatic lymph node on CT imaging that was concerning for metastatic disease.  We did get a PET scan today and that lymph node is hypermetabolic.  Does he still need EUS with you?  Thanks  TK

## 2016-05-12 NOTE — Telephone Encounter (Signed)
EUS has been cancelled and pt has been notified.  All questions were answered.

## 2016-05-13 ENCOUNTER — Ambulatory Visit (HOSPITAL_COMMUNITY): Admit: 2016-05-13 | Payer: Medicaid Other | Admitting: Internal Medicine

## 2016-05-13 ENCOUNTER — Telehealth (HOSPITAL_COMMUNITY): Payer: Self-pay | Admitting: Oncology

## 2016-05-13 ENCOUNTER — Encounter (HOSPITAL_BASED_OUTPATIENT_CLINIC_OR_DEPARTMENT_OTHER): Payer: Medicaid Other | Admitting: Adult Health

## 2016-05-13 ENCOUNTER — Encounter (HOSPITAL_COMMUNITY): Payer: Self-pay | Admitting: Adult Health

## 2016-05-13 ENCOUNTER — Encounter (HOSPITAL_COMMUNITY): Payer: Self-pay

## 2016-05-13 VITALS — BP 143/67 | HR 73 | Temp 97.7°F | Resp 16 | Wt 147.7 lb

## 2016-05-13 DIAGNOSIS — Z720 Tobacco use: Secondary | ICD-10-CM

## 2016-05-13 DIAGNOSIS — C159 Malignant neoplasm of esophagus, unspecified: Secondary | ICD-10-CM | POA: Diagnosis not present

## 2016-05-13 SURGERY — COLONOSCOPY
Anesthesia: Moderate Sedation

## 2016-05-13 NOTE — Progress Notes (Signed)
[name], MD [address], [address] [place] [address]    CURRENT THERAPY: Staging  INTERVAL HISTORY: MELESIO MADARA 60 y.o. 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  EUS cancelled; we can proceed with treatment.    Squamous cell carcinoma of esophagus (HCC)   04/14/2016 Procedure    EGD by Dr. Laural Golden.      04/16/2016 Pathology Results    Esophagus, biopsy - INVASIVE POORLY DIFFERENTIATED SQUAMOUS CELL CARCINOMA.      04/26/2016 Imaging    CT CAP- 1. Prominent gastroesophageal junction without discrete esophageal mass. Notably, there is also a borderline enlarged enhancing lymph node in the gastrohepatic ligament immediately adjacent to the gastroesophageal junction, which is nonspecific, but could indicate early nodal disease. 2. The small cluster of low-attenuation lesions in segment 7 of the liver are too small to characterize. Statistically, these are likely to represent cysts. If there is clinical concern for metastatic disease to the liver, these could be further characterized with MRI of the abdomen with and without IV gadolinium at this time. 3. No other potential sites of metastatic disease noted elsewhere in the chest, abdomen or pelvis.      05/10/2016 PET scan    Hypermetabolic bilateral lower thoracic esophageal mass, consistent with known primary esophageal carcinoma.  Small hypermetabolic lymph node in the upper gastrohepatic ligament adjacent to GE junction, consistent with lymph node metastasis.  No other sites of metastatic disease identified.       He is doing well. Weight is stable. States his legs bother him, where they become tired and he has to rest.   He has no other complaints at this time. He smokes about 1/2 ppd.  He has been eating well. He denies any issues swallowing.   He has transportation issues. He is not sure how he would be able to get to  [facility] regularly.  He does ride his bicycle from his home in [place] to his appointments here at the cancer center. The hospital is about 7 minutes away from his house by bicycle.  He is apprehensive about having a port-a-cath placed, stating "I don't want to be cut on".   States, "I am about ready to give up" in reference to being scheduled for so many appointments for diagnostic tests. Later states he has not given up, "I'm just tired".   He admits he is confused about what is going on with him; he tells me "I got a call from somebody telling me I was alright and I didn't need to go to anymore appointments."    He is here today to review imaging and medical oncology recommendations for treatment of his esophageal cancer.   Review of Systems  Constitutional: Positive for malaise/fatigue. Negative for chills, fever and weight loss.  HENT: Negative.   Eyes: Negative.   Respiratory: Negative.  Negative for cough.   Cardiovascular: Negative.  Negative for chest pain.  Gastrointestinal: Negative.  Negative for constipation, diarrhea, nausea and vomiting.  Genitourinary: Negative.  Negative for hematuria.  Musculoskeletal: Positive for myalgias (leg pain at times ).  Skin: Negative.   Neurological: Negative.  Negative for weakness.  Endo/Heme/Allergies: Negative.   Psychiatric/Behavioral: Negative.     Past Medical History:  Diagnosis Date  . 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 Anxiety   . Arthritis   . Cirrhosis, alcoholic ([place])   .  Clubbing of fingers    congenital  . Depression   . Full dentures   . History of PSVT (paroxysmal supraventricular tachycardia)    run of non-sustatined VT 03-10-2015 in setting of alcohol withdrawal in hospital  . History of seizure    03-08-2012  alcohol withdrawal  . History of thrombocytopenia    10/ 2013  in setting of alcohol withdrawal  . Hyperlipidemia   . Hypertension   . Prostate cancer [facility]) urologist-  dr dalhstedt/   oncologist- dr Tammi Klippel   T1c, Gleason 3+3,  PSA 8.89,  vol 27cc  . 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 Squamous cell carcinoma of esophagus ([place]) 04/21/2016    Past Surgical History:  Procedure Laterality Date  . BIOPSY  04/14/2016   Procedure: BIOPSY;  Surgeon: Rogene Houston, MD;  [facility];  Service: Endoscopy;;  esophagus  . COLONOSCOPY N/A 04/14/2016   Procedure: COLONOSCOPY;  Surgeon: Rogene Houston, MD;  [facility];  Service: Endoscopy;  Laterality: N/A;  . ESOPHAGOGASTRODUODENOSCOPY N/A 04/14/2016   Procedure: ESOPHAGOGASTRODUODENOSCOPY (EGD);  Surgeon: Rogene Houston, MD;  [facility];  Service: Endoscopy;  Laterality: N/A;  1:00  . NO PAST SURGERIES    . RADIOACTIVE SEED IMPLANT N/A 09/20/2014   Procedure: RADIOACTIVE SEED IMPLANT;  Surgeon: Franchot Gallo, MD;  Location: [facility];  Service: Urology;  Laterality: N/A;    77   seeds implanted no seeds found in bladder  . TRANSURETHRAL RESECTION OF PROSTATE      Family History  Problem Relation Age of Onset  . Alcohol abuse Mother   . Diabetes Sister     Social History   Social History  . Marital status: Single    Spouse name: N/A  . Number of children: N/A  . Years of education: N/A   Social History Main Topics  . Smoking status: Current Every Day Smoker    Packs/day: 0.50    Years: 20.00    Types: Cigarettes  . Smokeless tobacco: Former Systems developer    Types: Chew  . Alcohol use No     Comment: quit drinking in October 2015. He says before this he says he stayed drunk every day.   . Drug use: No     Comment: last use october 2015  . Sexual activity: Not Asked   Other Topics Concern  . None   Social History Narrative  . None     PHYSICAL EXAMINATION  ECOG PERFORMANCE STATUS: 1 - Symptomatic but completely ambulatory  Vitals:   05/13/16 0817  BP: (!) 143/67  Pulse: 73  Resp: 16  Temp: 97.7 F (36.5 C)   Filed Weights   05/13/16 0817  Weight:  147 lb 11.2 oz (67 kg)   General: Thin male in no acute distress.  Unaccompanied today.   HEENT: Head is normocephalic.  Pupils equal and reactive to light. Conjunctivae clear without exudate.  Sclerae anicteric. Oral mucosa is pink and moist without lesions. Oropharynx is pink and moist without lesions. Lymph: No cervical, supraclavicular, infraclavicular, or axillary lymphadenopathy noted on palpation.   Cardiovascular: Normal rate and rhythm Respiratory: Clear to auscultation bilaterally. Chest expansion symmetric without accessory muscle use. Breathing non-labored.    GU: Deferred.   GI: Soft, non-tender abdomen. Normoactive bowel sounds.  Neuro: No focal deficits. Steady gait.   Psych: Normal mood and affect for situation. Extremities: No edema. Skin: Warm and dry.     LABORATORY DATA: I have reviewed the  data as listed. CBC    Component Value Date/Time   WBC 9.3 04/21/2016 1649   RBC 4.20 (L) 04/21/2016 1649   HGB 12.2 (L) 04/21/2016 1649   HCT 36.7 (L) 04/21/2016 1649   PLT 205 04/21/2016 1649   MCV 87.4 04/21/2016 1649   MCH 29.0 04/21/2016 1649   MCHC 33.2 04/21/2016 1649   RDW 15.5 04/21/2016 1649   LYMPHSABS 2.2 04/21/2016 1649   MONOABS 0.9 04/21/2016 1649   EOSABS 0.2 04/21/2016 1649   BASOSABS 0.0 04/21/2016 1649      Chemistry      Component Value Date/Time   NA 137 04/21/2016 1649   K 4.1 04/21/2016 1649   CL 104 04/21/2016 1649   CO2 26 04/21/2016 1649   BUN 19 04/21/2016 1649   CREATININE 0.96 04/21/2016 1649   CREATININE 1.01 09/24/2015 0909      Component Value Date/Time   CALCIUM 9.2 04/21/2016 1649   ALKPHOS 68 04/21/2016 1649   AST 19 04/21/2016 1649   ALT 18 04/21/2016 1649   BILITOT 0.5 04/21/2016 1649      PENDING LABS: None at this time.    RADIOGRAPHIC STUDIES: CT chest/abd/pelvis: 04/26/16 Study Result   CLINICAL DATA:  60 year old male with history of esophageal cancer. Followup study. Prior history of prostate cancer  diagnosed 2 years ago, status post surgery.  EXAM: CT CHEST, ABDOMEN, AND PELVIS WITH CONTRAST  TECHNIQUE: Multidetector CT imaging of the chest, abdomen and pelvis was performed following the standard protocol during bolus administration of intravenous contrast.  CONTRAST:  183m ISOVUE-300 IOPAMIDOL (ISOVUE-300) INJECTION 61%  COMPARISON:  No priors.  FINDINGS: CT CHEST FINDINGS  Cardiovascular: Heart size is normal. There is no significant pericardial fluid, thickening or pericardial calcification. There is aortic atherosclerosis, as well as atherosclerosis of the great vessels of the mediastinum and the coronary arteries, including calcified atherosclerotic plaque in the left main, left anterior descending, left circumflex and right coronary arteries.  Mediastinum/Nodes: No pathologically enlarged mediastinal or hilar lymph nodes. There is some amorphous thickening of the gastroesophageal junction, but no discrete esophageal mass is confidently identified by CT examination. No axillary lymphadenopathy.  Lungs/Pleura: No suspicious appearing pulmonary nodules or masses. No acute consolidative airspace disease. No pleural effusions.  Musculoskeletal: There are no aggressive appearing lytic or blastic lesions noted in the visualized portions of the skeleton.  CT ABDOMEN PELVIS FINDINGS  Hepatobiliary: There is a cluster of sub cm low-attenuation lesions in the segment 7 of the liver which are too small to characterize on today's examination. No larger more suspicious appearing hepatic lesions are noted. No intra or extrahepatic biliary ductal dilatation. Gallbladder is normal in appearance.  Pancreas: No pancreatic mass. No pancreatic ductal dilatation. No pancreatic or peripancreatic fluid or inflammatory changes.  Spleen: Unremarkable.  Adrenals/Urinary Tract: Several sub cm low-attenuation lesions are noted in the kidneys bilaterally, too small to  definitively characterize, but statistically likely to represent tiny cysts. Bilateral adrenal glands are normal in appearance. No hydroureteronephrosis. Urinary bladder is normal in appearance.  Stomach/Bowel: Thickening of the gastroesophageal junction is noted, without a discrete mass. Remainder of the stomach is otherwise normal in appearance. No pathologic dilatation of small bowel or colon. Normal appendix.  Vascular/Lymphatic: Aortic atherosclerosis, without evidence of aneurysm or dissection in the abdominal or pelvic vasculature. There is a prominent borderline enlarged lymph node in the gastrohepatic ligament (image 53 of series 2) adjacent to the prominent gastroesophageal junction, which is nonspecific, but concerning for potential nodal disease.  No other lymphadenopathy is confidently identified elsewhere in the abdomen or pelvis.  Reproductive: Brachytherapy implants throughout the prostate gland. Seminal vesicles are unremarkable in appearance.  Other: No significant volume of ascites.  No pneumoperitoneum.  Musculoskeletal: There are no aggressive appearing lytic or blastic lesions noted in the visualized portions of the skeleton.  IMPRESSION: 1. Prominent gastroesophageal junction without discrete esophageal mass. Notably, there is also a borderline enlarged enhancing lymph node in the gastrohepatic ligament immediately adjacent to the gastroesophageal junction, which is nonspecific, but could indicate early nodal disease. 2. The small cluster of low-attenuation lesions in segment 7 of the liver are too small to characterize. Statistically, these are likely to represent cysts. If there is clinical concern for metastatic disease to the liver, these could be further characterized with MRI of the abdomen with and without IV gadolinium at this time. 3. No other potential sites of metastatic disease noted elsewhere in the chest, abdomen or pelvis. 4. Aortic  atherosclerosis, in addition to left main and 3 vessel coronary artery disease. Please note that although the presence of coronary artery calcium documents the presence of coronary artery disease, the severity of this disease and any potential stenosis cannot be assessed on this non-gated CT examination. Assessment for potential risk factor modification, dietary therapy or pharmacologic therapy may be warranted, if clinically indicated. 5. Postprocedural changes of prostate brachytherapy. 6. Additional incidental findings, as above.   Electronically Signed   By: Vinnie Langton M.D.   On: 04/26/2016 12:43    PET: 05/07/16 Study Result   CLINICAL DATA:  Initial treatment strategy for esophageal squamous cell carcinoma.  EXAM: NUCLEAR MEDICINE PET SKULL BASE TO THIGH  TECHNIQUE: 7.1 mCi F-18 FDG was injected intravenously. Full-ring PET imaging was performed from the skull base to thigh after the radiotracer. CT data was obtained and used for attenuation correction and anatomic localization.  FASTING BLOOD GLUCOSE:  Value: 87 mg/dl  COMPARISON:  CT on 04/26/2016  FINDINGS: NECK  No hypermetabolic lymph nodes in the neck.  CHEST  Focal hypermetabolic activity is seen in the lower thoracic esophagus corresponding to concentric esophageal wall thickening, which has SUV max of 15.3.  No hypermetabolic mediastinal or hilar nodes. No suspicious pulmonary nodules on the CT scan.  Aortic atherosclerosis.   Coronary artery calcification.  ABDOMEN/PELVIS  No abnormal hypermetabolic activity within the liver, pancreas, adrenal glands, or spleen.  11 mm lymph node in the upper gastrohepatic ligament along the anterior aspect of the GE junction is hypermetabolic, with SUV max of 7.2. No other hypermetabolic lymph nodes identified within abdomen or pelvis.  Tiny sub-cm low-attenuation lesions in the posterior right hepatic lobe show no corresponding  hypermetabolic activity. Aortic atherosclerosis. Prostate brachytherapy seeds again noted.  SKELETON  No focal hypermetabolic activity to suggest skeletal metastasis.  IMPRESSION: Hypermetabolic bilateral lower thoracic esophageal mass, consistent with known primary esophageal carcinoma.  Small hypermetabolic lymph node in the upper gastrohepatic ligament adjacent to GE junction, consistent with lymph node metastasis.  No other sites of metastatic disease identified.   Electronically Signed   By: Earle Gell M.D.   On: 05/07/2016 17:01     PATHOLOGY:     ASSESSMENT AND PLAN:  Mr. Gatta is a 60 y.o. male from [place] with T2 vs T3, at least N1 squamous cell carcinoma of the esophagus (Stage IIB vs Stage IIIA).   Esophageal cancer:  He is here today to review imaging and medical oncology recommendations moving forward.  I reviewed 05/07/16 PET  results with the patient. This showed hypermetabolic bilateral lower thoracic esophageal mass, consistent with known primary esophageal carcinoma. Also seen was a small hypermetabolic lymph node in the upper gastrohepatic ligament adjacent to GE junction, consistent with lymph node metastasis. No other sites of metastatic disease identified.  We discussed our recommendations for treatment including, concurrent chemoradiation for about 6 weeks. I explained the purpose & role of both chemotherapy and radiation in managing his disease. He is very overwhelmed by his diagnosis and treatment options.  After quite a bit of discussion, he expressed understanding.   I have referred the patient to Radiation Oncology at [facility] for initial consultation as soon as they have availability.  He will be scheduled for port placement here at [facility] in the near future as well.  Port placement:  I spoke with the patient about having a port-a-cath placed for chemotherapy infusions. Initially, he was very apprehensive about having a port  placed. After further discussion, he was agreeable to having the procedure done and understands the purpose of having the port placed.    He met with our patient navigator Anderson Malta today in preparation for future chemo teaching.   Kirby Crigler, PA-C touched base with Dr. Rosana Hoes re: port placement; formal referral to be sent today with plans to have port placed sometime within the next 2 weeks, if possible.    Transportation concerns:  He has transportation issues and is not sure if he'll be able to make it to radiation treatment daily or chemotherapy weekly. I will reach out to our social worker (Referral to social work placed today) and nurse navigator for transportation assistance for this patient. An Armed forces technical officer or the [employer] in [place] may both be viable options for him.   Smoking cessation:  Smoking cessation was also discussed. We will continue to work on this at future visits. The patient currently smokes about 1/2 ppd.         Dispo:  -Referral to Radiation Oncology at [facility] placement to be done at [facility]; orders placed today.   -Return to cancer to see Dr. Whitney Muse in about 3 weeks to finalize treatment plan and for consideration of Cycle 1, Day 1 of Carbo/Taxol.      ORDERS PLACED FOR THIS ENCOUNTER: No orders of the defined types were placed in this encounter.   MEDICATIONS PRESCRIBED THIS ENCOUNTER: No orders of the defined types were placed in this encounter.   THERAPY PLAN:   -Concurrent chemoradiation with weekly Carboplatin/Paclitaxel x 6 cycles.       All questions were answered. The patient knows to call the clinic with any problems, questions or concerns. We can certainly see the patient much sooner if necessary.  This document serves as a record of services personally performed by Mike Craze NP. It was created on her behalf by Arlyce Harman, a trained medical scribe. The creation of this record is  based on the scribe's personal observations and the provider's statements to them. This document has been checked and approved by the attending provider.  Patient and plan discussed with Dr. Ancil Linsey and she is in agreement with the aforementioned.    A total of 35 minutes was spent in the face-to-face care of this patient with greater than 50% of that time spent in counseling and care-coordination.    Mike Craze, NP [place]  [phone]

## 2016-05-13 NOTE — Telephone Encounter (Signed)
Schedule for port placement on 05/25/16 @ 1:45

## 2016-05-13 NOTE — Patient Instructions (Addendum)
[place] at [facility] Discharge Instructions  RECOMMENDATIONS MADE BY THE CONSULTANT AND ANY TEST RESULTS WILL BE SENT TO YOUR REFERRING PHYSICIAN.  You saw Stanley Craze, NP today. See Amy at checkout for appointments.  -We will get you in to see the radiation doctors in [place] at [place] to discuss your treatment plan.  -We will get you in to see a doctor at [facility] to get your port placed in your chest in preparation for chemo.  -We will help you with transportation for your appointments.    Thank you for choosing [place] at [facility] to provide your oncology and hematology care.  To afford each patient quality time with our provider, please arrive at least 15 minutes before your scheduled appointment time.   Beginning January 23rd 2017 lab work for the Stanley Jimenez will be done in the  Main lab at [facility] on 1st floor. If you have a lab appointment with the [facility] please come in thru the  Main Entrance and check in at the main information desk  You need to re-schedule your appointment should you arrive 10 or more minutes late.  We strive to give you quality time with our providers, and arriving late affects you and other patients whose appointments are after yours.  Also, if you no show three or more times for appointments you may be dismissed from the clinic at the providers discretion.     Again, thank you for choosing [facility].  Our hope is that these requests will decrease the amount of time that you wait before being seen by our physicians.       _____________________________________________________________  Should you have questions after your visit to [facility], please contact our office at [phone] between the hours of 8:30 a.m. and 4:30 p.m.  Voicemails left after 4:30 p.m. will not be returned until the following business day.  For prescription refill requests, have your  pharmacy contact our office.         Resources For Cancer Patients and their Caregivers ? American Cancer Society: Can assist with transportation, wigs, general needs, runs Look Good Feel Better.        279-528-0227 ? Cancer Care: Provides financial assistance, online support groups, medication/co-pay assistance.  1-800-813-HOPE (513)371-7566) ? [place] Assists [place] cancer patients and their families through emotional , educational and financial support.  (782)784-2851 ? [place] DSS Where to apply for food stamps, Medicaid and utility assistance. 905-352-8099 ? [facility]: Transportation to medical appointments. 812-428-0851 ? Social Security Administration: May apply for disability if have a Stage IV cancer. 323-433-5825 367-523-5075 ? [employer], Disability and Transit Services: Assists with nutrition, care and transit needs. [place] Support Programs: @10RELATIVEDAYS @ > Cancer Support Group  2nd Tuesday of the month 1pm-2pm, Journey Room  > Creative Journey  3rd Tuesday of the month 1130am-1pm, Journey Room  > Look Good Feel Better  1st Wednesday of the month 10am-12 noon, Journey Room (Call [place] to register [phone])

## 2016-05-13 NOTE — Telephone Encounter (Signed)
PC TO PT TO NOTIFY HIM OF HIS PORT PLACEMENT APPT ON 1/05/25/16@1  45

## 2016-05-13 NOTE — Telephone Encounter (Signed)
FAXED RECS TO DR Lincoln

## 2016-05-13 NOTE — Progress Notes (Deleted)
[name], MD [address] [place] [address]    CURRENT THERAPY: Staging  INTERVAL HISTORY: Stanley Jimenez 60 y.o. 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      Squamous cell carcinoma of esophagus (HCC)   04/14/2016 Procedure    EGD by Dr. Laural Golden.      04/16/2016 Pathology Results    Esophagus, biopsy - INVASIVE POORLY DIFFERENTIATED SQUAMOUS CELL CARCINOMA.      04/26/2016 Imaging    CT CAP- 1. Prominent gastroesophageal junction without discrete esophageal mass. Notably, there is also a borderline enlarged enhancing lymph node in the gastrohepatic ligament immediately adjacent to the gastroesophageal junction, which is nonspecific, but could indicate early nodal disease. 2. The small cluster of low-attenuation lesions in segment 7 of the liver are too small to characterize. Statistically, these are likely to represent cysts. If there is clinical concern for metastatic disease to the liver, these could be further characterized with MRI of the abdomen with and without IV gadolinium at this time. 3. No other potential sites of metastatic disease noted elsewhere in the chest, abdomen or pelvis.      05/10/2016 PET scan    Hypermetabolic bilateral lower thoracic esophageal mass, consistent with known primary esophageal carcinoma.  Small hypermetabolic lymph node in the upper gastrohepatic ligament adjacent to GE junction, consistent with lymph node metastasis.  No other sites of metastatic disease identified.       He is doing well.  He denies any complaints today.  Weight is stable.   He is here today to review imaging and medical oncology recommendations moving forward.  Review of Systems  Constitutional: Negative.  Negative for chills, fever and weight loss.  HENT: Negative.   Eyes: Negative.   Respiratory: Negative.  Negative for cough.   Cardiovascular:  Negative.  Negative for chest pain.  Gastrointestinal: Negative.  Negative for constipation, diarrhea, nausea and vomiting.  Genitourinary: Negative.   Musculoskeletal: Negative.   Skin: Negative.   Neurological: Negative.  Negative for weakness.  Endo/Heme/Allergies: Negative.   Psychiatric/Behavioral: Negative.     Past Medical History:  Diagnosis Date  . 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 Anxiety   . Arthritis   . Cirrhosis, alcoholic ([place])   . Clubbing of fingers    congenital  . Depression   . Full dentures   . History of PSVT (paroxysmal supraventricular tachycardia)    run of non-sustatined VT 03-10-2015 in setting of alcohol withdrawal in hospital  . History of seizure    03-08-2012  alcohol withdrawal  . History of thrombocytopenia    10/ 2013  in setting of alcohol withdrawal  . Hyperlipidemia   . Hypertension   . Prostate cancer [facility]) urologist-  dr dalhstedt/  oncologist- dr Tammi Klippel   T1c, Gleason 3+3,  PSA 8.89,  vol 27cc  . 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 Squamous cell carcinoma of esophagus ([place]) 04/21/2016    Past Surgical History:  Procedure Laterality Date  . BIOPSY  04/14/2016   Procedure: BIOPSY;  Surgeon: Rogene Houston, MD;  [facility];  Service: Endoscopy;;  esophagus  . COLONOSCOPY N/A 04/14/2016   Procedure: COLONOSCOPY;  Surgeon: Rogene Houston, MD;  [facility];  Service: Endoscopy;  Laterality: N/A;  . ESOPHAGOGASTRODUODENOSCOPY N/A 04/14/2016   Procedure: ESOPHAGOGASTRODUODENOSCOPY (EGD);  Surgeon: Rogene Houston, MD;  [facility]: [facility];  Service: Endoscopy;  Laterality: N/A;  1:00  . NO PAST SURGERIES    . RADIOACTIVE SEED IMPLANT N/A 09/20/2014   Procedure: RADIOACTIVE SEED IMPLANT;  Surgeon: Franchot Gallo, MD;  Location[facility];  Service: Urology;  Laterality: N/A;    79   seeds implanted no seeds found in bladder  . TRANSURETHRAL RESECTION OF PROSTATE      Family  History  Problem Relation Age of Onset  . Alcohol abuse Mother   . Diabetes Sister     Social History   Social History  . Marital status: Single    Spouse name: N/A  . Number of children: N/A  . Years of education: N/A   Social History Main Topics  . Smoking status: Current Every Day Smoker    Packs/day: 0.50    Years: 20.00    Types: Cigarettes  . Smokeless tobacco: Former Systems developer    Types: Chew  . Alcohol use No     Comment: quit drinking in October 2015. He says before this he says he stayed drunk every day.   . Drug use: No     Comment: last use october 2015  . Sexual activity: Not on file   Other Topics Concern  . Not on file   Social History Narrative  . No narrative on file     PHYSICAL EXAMINATION  ECOG PERFORMANCE STATUS: 1 - Symptomatic but completely ambulatory  There were no vitals filed for this visit.  GENERAL:alert, no distress, comfortable, cooperative, smiling and unaccompanied  SKIN: skin color, texture, turgor are normal, no rashes or significant lesions HEAD: Normocephalic, No masses, lesions, tenderness or abnormalities EYES: normal, EOMI, Conjunctiva are pink and non-injected EARS: External ears normal OROPHARYNX:lips, buccal mucosa, and tongue normal and mucous membranes are moist  NECK: supple, trachea midline LYMPH:  no palpable lymphadenopathy BREAST:not examined LUNGS: clear to auscultation  HEART: regular rate & rhythm ABDOMEN:abdomen soft and normal bowel sounds BACK: Back symmetric, no curvature. EXTREMITIES:less then 2 second capillary refill, no joint deformities, effusion, or inflammation, no skin discoloration, no cyanosis  NEURO: alert & oriented x 3 with fluent speech, no focal motor/sensory deficits, gait normal   LABORATORY DATA: CBC    Component Value Date/Time   WBC 9.3 04/21/2016 1649   RBC 4.20 (L) 04/21/2016 1649   HGB 12.2 (L) 04/21/2016 1649   HCT 36.7 (L) 04/21/2016 1649   PLT 205 04/21/2016 1649   MCV 87.4  04/21/2016 1649   MCH 29.0 04/21/2016 1649   MCHC 33.2 04/21/2016 1649   RDW 15.5 04/21/2016 1649   LYMPHSABS 2.2 04/21/2016 1649   MONOABS 0.9 04/21/2016 1649   EOSABS 0.2 04/21/2016 1649   BASOSABS 0.0 04/21/2016 1649      Chemistry      Component Value Date/Time   NA 137 04/21/2016 1649   K 4.1 04/21/2016 1649   CL 104 04/21/2016 1649   CO2 26 04/21/2016 1649   BUN 19 04/21/2016 1649   CREATININE 0.96 04/21/2016 1649   CREATININE 1.01 09/24/2015 0909      Component Value Date/Time   CALCIUM 9.2 04/21/2016 1649   ALKPHOS 68 04/21/2016 1649   AST 19 04/21/2016 1649   ALT 18 04/21/2016 1649   BILITOT 0.5 04/21/2016 1649        PENDING LABS:   RADIOGRAPHIC STUDIES: CT chest/abd/pelvis:   PET: 05/07/16    PATHOLOGY:    ASSESSMENT AND PLAN:     ORDERS PLACED  FOR THIS ENCOUNTER: No orders of the defined types were placed in this encounter.   MEDICATIONS PRESCRIBED THIS ENCOUNTER: No orders of the defined types were placed in this encounter.   THERAPY PLAN:  EUS cancelled; we can proceed with treatment.   All questions were answered. The patient knows to call the clinic with any problems, questions or concerns. We can certainly see the patient much sooner if necessary.  Patient and plan discussed with Dr. Ancil Linsey and she is in agreement with the aforementioned.   This note is electronically signed by: Trinda Pascal, NP 05/13/2016 8:09 AM

## 2016-05-14 ENCOUNTER — Telehealth (HOSPITAL_COMMUNITY): Payer: Self-pay | Admitting: Oncology

## 2016-05-14 NOTE — Telephone Encounter (Signed)
APPT TO SEE RAD ON ON 12/27 @ 8 30

## 2016-05-25 DIAGNOSIS — C159 Malignant neoplasm of esophagus, unspecified: Secondary | ICD-10-CM | POA: Diagnosis not present

## 2016-05-25 NOTE — Patient Instructions (Addendum)
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  We are going to treat you with weekly carboplatin and taxol.  You will have fluids twice a week.  Along with radiation daily (monday through Friday).  This is for 6 cycles or 6 weeks.  This is with curative intent. You will see the doctor regularly throughout treatment.  We monitor your lab work prior to every treatment.  The doctor monitors your response to treatment by the way you are feeling, your blood work, and scans periodically.   You will have the following premedications prior to receiving chemotherapy: Premeds: Benadryl:  Help prevent a reaction to the chemotherapy.  Pepcid: antihistamine premed Aloxi - high powered nausea/vomiting prevention medication used for chemotherapy patients.  Dexamethasone - steroid - given to reduce the risk of you having an allergic type reaction to the chemotherapy. Dex can cause you to feel energized, nervous/anxious/jittery, make you have trouble sleeping, and/or make you feel hot/flushed in the face/neck and/or look pink/red in the face/neck. These side effects will pass as the Dex wears off. (takes 20 minutes to infuse)  POTENTIAL SIDE EFFECTS OF TREATMENT:  Carboplatin (Generic Name) Other Names: Paraplatin, CBDCA  About This Drug Carboplatin is a drug used to treat cancer. This drug is given in the vein (IV). This drug takes about 30 minutes to infuse.   Possible Side Effects (More Common) . Nausea and throwing up (vomiting). These symptoms may happen within a few hours after your treatment and may last up to 24 hours. Medicines are available to stop or lessen these side effects. . Bone marrow depression. This is a decrease in the number of white blood cells, red blood cells, and platelets. This may raise your risk of infection, make you tired and weak (fatigue), and raise your risk of  bleeding. . Soreness of the mouth and throat. You may have red areas, white patches, or sores that hurt. . This drug may affect how your kidneys work. Your kidney function will be checked as needed. . Electrolyte changes. Your blood will be checked for electrolyte changes as needed.  Side Effects (Less Common) . Hair loss. Some patients lose their hair on the scalp and body. You may notice your hair thinning seven to 14 days after getting this drug. . Effects on the nerves are called peripheral neuropathy. You may feel numbness, tingling, or pain in your hands and feet. It may be hard for you to button your clothes, open jars, or walk as usual. The effect on the nerves may get worse with more doses of the drug. These effects get better in some people after the drug is stopped but it does not get better in all people. . Loose bowel movements (diarrhea) that may last for several days . Decreased hearing or ringing in the ears . Changes in the way food and drinks taste . Changes in liver function. Your liver function will be checked as needed.  Allergic Reactions Serious allergic reactions including anaphylaxis are rare. While you are getting this drug in your vein (IV), tell your nurse right away if you have any of these symptoms of an allergic reaction: . Trouble catching your breath . Feeling like your tongue or throat are swelling . Feeling your heart beat quickly or in a not normal way (palpitations) . Feeling dizzy or lightheaded . Flushing, itching, rash, and/or hives  Treating Side Effects .  Drink 6-8 cups of fluids each day unless your doctor has told you to limit your fluid intake due to some other health problem. A cup is 8 ounces of fluid. If you throw up or have loose bowel movements, you should drink more fluids so that you do not become dehydrated (lack water in the body from losing too much fluid). . Mouth care is very important. Your mouth care should consist of routine, gentle  cleaning of your teeth or dentures and rinsing your mouth with a mixture of 1/2 teaspoon of salt in 8 ounces of water or  teaspoon of baking soda in 8 ounces of water. This should be done at least after each meal and at bedtime. . If you have mouth sores, avoid mouthwash that has alcohol. Avoid alcohol and smoking because they can bother your mouth and throat. . If you have numbness and tingling in your hands and feet, be careful when cooking, walking, and handling sharp objects and hot liquids. . Talk with your nurse about getting a wig before you lose your hair. Also, call the [facility] at 800-ACS-2345 to find out information about the "Look Good, Feel Better" program close to where you live. It is a free program where women getting chemotherapy can learn about wigs, turbans and scarves as well as makeup techniques and skin and nail care.  Food and Drug Interactions There are no known interactions of carboplatin with food. This drug may interact with other medicines. Tell your doctor and pharmacist about all the medicines and dietary supplements (vitamins, minerals, herbs and others) that you are taking at this time. The safety and use of dietary supplements and alternative diets are often not known. Using these might affect your cancer or interfere with your treatment. Until more is known, you should not use dietary supplements or alternative diets without your doctor's help.  When to Call the Doctor Call your doctor or nurse right away if you have any of these symptoms: . Fever of 100.5 F (38 C) or above; chills . Bleeding or bruising that is not normal . Wheezing or trouble breathing . Nausea that stops you from eating or drinking . Throwing up more than once a day . Rash or itching . Loose bowel movements (diarrhea) more than four times a day or diarrhea with weakness or feeling lightheaded . Call your doctor or nurse as soon as possible if any of these symptoms happen: .  Numbness, tingling, decreased feeling or weakness in fingers, toes, arms, or legs . Change in hearing, ringing in the ears . Blurred vision or other changes in eyesight . Decreased urine . Yellowing of skin or eyes  Problems and Reproductive Concerns Sexual problems and reproduction concerns may happen. In both men and women, this drug may affect your ability to have children. This cannot be determined before your treatment. Talk with your doctor or nurse if you plan to have children. Ask for information on sperm or egg banking. In men, this drug may interfere with your ability to make sperm, but it should not change your ability to have sexual relations. In women, menstrual bleeding may become irregular or stop while you are getting this drug. Do not assume that you cannot become pregnant if you do not have a menstrual period. Women may go through signs of menopause (change of life) like vaginal dryness or itching. Vaginal lubricants can be used to lessen vaginal dryness, itching, and pain during sexual relations. Genetic counseling is available for  you to talk about the effects of this drug therapy on future pregnancies. Also, a genetic counselor can look at the possible risk of problems in the unborn baby due to this medicine if an exposure happens during pregnancy. . Pregnancy warning: This drug may have harmful effects on the unborn child, so effective methods of birth control should be used during your cancer treatment. . Breast feeding warning: It is not known if this drug passes into breast milk. For this reason, women should talk to their doctor about the risks and benefits of breast feeding during treatment with this drug because this drug may enter the breast milk and badly harm a breast feeding baby.   Paclitaxel (Taxol)  Paclitaxel is a drug used to treat cancer. It is given in the vein (IV).  This drug takes longer the first time to infuse because it is titrated in, the second and  subsequent infusions take 1 hour.  Possible Side Effects . Hair loss. Hair loss is often temporary, although with certain medicine, hair loss can sometimes be permanent. Hair loss may happen suddenly or gradually. If you lose hair, you may lose it from your head, face, armpits, pubic area, chest, and/or legs. You may also notice your hair getting thin. . Swelling of your legs, ankles and/or feet (edema) . Flushing . Nausea and throwing up (vomiting) . Loose bowel movements (diarrhea) . Bone marrow depression. This is a decrease in the number of white blood cells, red blood cells, and platelets. This may raise your risk of infection, make you tired and weak (fatigue), and raise your risk of bleeding. . Effects on the nerves are called peripheral neuropathy. You may feel numbness, tingling, or pain in your hands and feet. It may be hard for you to button your clothes, open jars, or walk as usual. The effect on the nerves may get worse with more doses of the drug. These effects get better in some people after the drug is stopped but it does not get better in all people. . Changes in your liver function . Bone, joint and muscle pain . Abnormal EKG . Allergic reaction: Allergic reactions, including anaphylaxis are rare but may happen in some patients. Signs of allergic reaction to this drug may be swelling of the face, feeling like your tongue or throat are swelling, trouble breathing, rash, itching, fever, chills, feeling dizzy, and/or feeling that your heart is beating in a fast or not normal way. If this happens, do not take another dose of this drug. You should get urgent medical treatment. . Infection . Changes in your kidney function. Note: Each of the side effects above was reported in 20% or greater of patients treated with paclitaxel. Not all possible side effects are included above. Warnings and Precautions . Severe bone marrow depression  Side Effects . To help with hair loss, wash with a  mild shampoo and avoid washing your hair every day. . Avoid rubbing your scalp, instead, pat your hair or scalp dry . Avoid coloring your hair . Limit your use of hair spray, electric curlers, blow dryers, and curling irons. . If you are interested in getting a wig, talk to your nurse. You can also call the [facility] at 800-ACS-2345 to find out information about the "Look Good, Feel Better" program close to where you live. It is a free program where women getting chemotherapy can learn about wigs, turbans and scarves as well as makeup techniques and skin and nail care. Marland Kitchen  Ask your doctor or nurse about medicines that are available to help stop or lessen diarrhea and/or nausea. . To help with nausea and vomiting, eat small, frequent meals instead of three large meals a day. Choose foods and drinks that are at room temperature. Ask your nurse or doctor about other helpful tips and medicine that is available to help or stop lessen these symptoms. . If you get diarrhea, eat low-fiber foods that are high in protein and calories and avoid foods that can irritate your digestive tracts or lead to cramping. Ask your nurse or doctor about medicine that can lessen or stop your diarrhea. . Mouth care is very important. Your mouth care should consist of routine, gentle cleaning of your teeth or dentures and rinsing your mouth with a mixture of 1/2 teaspoon of salt in 8 ounces of water or  teaspoon of baking soda in 8 ounces of water. This should be done at least after each meal and at bedtime. . If you have mouth sores, avoid mouthwash that has alcohol. Also avoid alcohol and smoking because they can bother your mouth and throat. . Drink plenty of fluids (a minimum of eight glasses per day is recommended). . Take your temperature as your doctor or nurse tells you, and whenever you feel like you may have a fever. . Talk to your doctor or nurse about precautions you can take to avoid infections and  bleeding. . Be careful when cooking, walking, and handling sharp objects and hot liquids.  Food and Drug Interactions . There are no known interactions of paclitaxel with food. . This drug may interact with other medicines. Tell your doctor and pharmacist about all the medicines and dietary supplements (vitamins, minerals, herbs and others) that you are taking at this time. . The safety and use of dietary supplements and alternative diets are often not known. Using these might affect your cancer or interfere with your treatment. Until more is known, you should not use dietary supplements or alternative diets without your cancer doctor's help.  When to Call the Doctor Call your doctor or nurse if you have any of the following symptoms and/or any new or unusual symptoms: . Fever of 100.5 F (38 C) or above . Chills . Redness, pain, warmth, or swelling at the IV site during the infusion . Signs of allergic reaction: swelling of the face, feeling like your tongue or throat are swelling, trouble breathing, rash, itching, fever, chills, feeling dizzy, and/or feeling that your heart is beating in a fast or not normal way . Feeling that your heart is beating in a fast or not normal way (palpitations) . Weight gain of 5 pounds in one week (fluid retention) . Decreased urine or very dark urine . Signs of liver problems: dark urine, pale bowel movements, bad stomach pain, feeling very tired and weak, unusual itching, or yellowing of the eyes or skin . Heavy menstrual period that lasts longer than normal . Easy bruising or bleeding . Nausea that stops you from eating or drinking, and/or that is not relieved by prescribed medicines. . Loose bowel movements (diarrhea) more than 4 times a day or diarrhea with weakness or lightheadedness . Pain in your mouth or throat that makes it hard to eat or drink . Lasting loss of appetite or rapid weight loss of five pounds in a week . Signs of peripheral neuropathy:  numbness, tingling, or decreased feeling in fingers or toes; trouble walking or changes in the way you walk; or  feeling clumsy when buttoning clothes, opening jars, or other routine activities . Joint and muscle pain that is not relieved by prescribed medicines . Extreme fatigue that interferes with normal activities . While you are getting this drug, please tell your nurse right away if you have any pain, redness, or swelling at the site of the IV infusion. . If you think you are pregnant.  Reproduction Warnings . Pregnancy warning: This drug may have harmful effects on the unborn child, it is recommended that effective methods of birth control should be used during your cancer treatment. Let your doctor know right away if you think you may be pregnant. . Breast feeding warning: Women should not breast feed during treatment because this drug could enter the breast milk and cause harm to a breast feeding baby.    EDUCATIONAL MATERIALS GIVEN AND REVIEWED: Chemotherapy and you book, nutrition book, information on carboplatin and taxol.   SELF CARE ACTIVITIES WHILE ON CHEMOTHERAPY: Hydration Increase your fluid intake 48 hours prior to treatment and drink at least 8 to 12 cups (64 ounces) of water/decaff beverages per day after treatment. You can still have your cup of coffee or soda but these beverages do not count as part of your 8 to 12 cups that you need to drink daily. No alcohol intake.  Medications Continue taking your normal prescription medication as prescribed.  If you start any new herbal or new supplements please let us know first to make sure it is safe.  Mouth Care Have teeth cleaned professionally before starting treatment. Keep dentures and partial plates clean. Use soft toothbrush and do not use mouthwashes that contain alcohol. Biotene is a good mouthwash that is available at most pharmacies or may be ordered by calling [phone]. Use warm salt water gargles (1 teaspoon  salt per 1 quart warm water) before and after meals and at bedtime. Or you may rinse with 2 tablespoons of three-percent hydrogen peroxide mixed in eight ounces of water. If you are still having problems with your mouth or sores in your mouth please call the clinic. If you need dental work, please let Dr. Whitney Muse know before you go for your appointment so that we can coordinate the best possible time for you in regards to your chemo regimen. You need to also let your dentist know that you are actively taking chemo. We may need to do labs prior to your dental appointment.   Skin Care Always use sunscreen that has not expired and with SPF (Sun Protection Factor) of 50 or higher. Wear hats to protect your head from the sun. Remember to use sunscreen on your hands, ears, face, & feet.  Use good moisturizing lotions such as udder cream, eucerin, or even Vaseline. Some chemotherapies can cause dry skin, color changes in your skin and nails.    . Avoid long, hot showers or baths. . Use gentle, fragrance-free soaps and laundry detergent. . Use moisturizers, preferably creams or ointments rather than lotions because the thicker consistency is better at preventing skin dehydration. Apply the cream or ointment within 15 minutes of showering. Reapply moisturizer at night, and moisturize your hands every time after you wash them.  Hair Loss (if your doctor says your hair will fall out)  . If your doctor says that your hair is likely to fall out, decide before you begin chemo whether you want to wear a wig. You may want to shop before treatment to match your hair color. . Hats, turbans, and scarves  can also camouflage hair loss, although some people prefer to leave their heads uncovered. If you go bare-headed outdoors, be sure to use sunscreen on your scalp. . Cut your hair short. It eases the inconvenience of shedding lots of hair, but it also can reduce the emotional impact of watching your hair fall  out. . Don't perm or color your hair during chemotherapy. Those chemical treatments are already damaging to hair and can enhance hair loss. Once your chemo treatments are done and your hair has grown back, it's OK to resume dyeing or perming hair. With chemotherapy, hair loss is almost always temporary. But when it grows back, it may be a different color or texture. In older adults who still had hair color before chemotherapy, the new growth may be completely gray.  Often, new hair is very fine and soft.  Infection Prevention Please wash your hands for at least 30 seconds using warm soapy water. Handwashing is the #1 way to prevent the spread of germs. Stay away from sick people or people who are getting over a cold. If you develop respiratory systems such as green/yellow mucus production or productive cough or persistent cough let us know and we will see if you need an antibiotic. It is a good idea to keep a pair of gloves on when going into grocery stores/[employer] to decrease your risk of coming into contact with germs on the carts, etc. Carry alcohol hand gel with you at all times and use it frequently if out in public. If your temperature reaches 100.5 or higher please call the clinic and let us know.  If it is after hours or on the weekend please go to the ER if your temperature is over 100.5.  Please have your own personal thermometer at home to use.    Sex and bodily fluids If you are going to have sex, a condom must be used to protect the person that isn't taking chemotherapy. Chemo can decrease your libido (sex drive). For a few days after chemotherapy, chemotherapy can be excreted through your bodily fluids.  When using the toilet please close the lid and flush the toilet twice.  Do this for a few day after you have had chemotherapy.     Effects of chemotherapy on your sex life Some changes are simple and won't last long. They won't affect your sex life permanently. Sometimes you may  feel: . too tired . not strong enough to be very active . sick or sore  . not in the mood . anxious or low Your anxiety might not seem related to sex. For example, you may be worried about the cancer and how your treatment is going. Or you may be worried about money, or about how you family are coping with your illness. These things can cause stress, which can affect your interest in sex. It's important to talk to your partner about how you feel. Remember - the changes to your sex life don't usually last long. There's usually no medical reason to stop having sex during chemo. The drugs won't have any long term physical effects on your performance or enjoyment of sex. Cancer can't be passed on to your partner during sex  Contraception It's important to use reliable contraception during treatment. Avoid getting pregnant while you or your partner are having chemotherapy. This is because the drugs may harm the baby. Sometimes chemotherapy drugs can leave a man or woman infertile.  This means you would not be able to have  children in the future. You might want to talk to someone about permanent infertility. It can be very difficult to learn that you may no longer be able to have children. Some people find counselling helpful. There might be ways to preserve your fertility, although this is easier for men than for women. You may want to speak to a fertility expert. You can talk about sperm banking or harvesting your eggs. You can also ask about other fertility options, such as donor eggs. If you have or have had breast cancer, your doctor might advise you not to take the contraceptive pill. This is because the hormones in it might affect the cancer.  It is not known for sure whether or not chemotherapy drugs can be passed on through semen or secretions from the vagina. Because of this some doctors advise people to use a barrier method if you have sex during treatment. This applies to vaginal, anal or oral  sex. Generally, doctors advise a barrier method only for the time you are actually having the treatment and for about a week after your treatment. Advice like this can be worrying, but this does not mean that you have to avoid being intimate with your partner. You can still have close contact with your partner and continue to enjoy sex.  Animals If you have cats or birds we just ask that you not change the litter or change the cage.  Please have someone else do this for you while you are on chemotherapy.   Food Safety During and After Cancer Treatment Food safety is important for people both during and after cancer treatment. Cancer and cancer treatments, such as chemotherapy, radiation therapy, and stem cell/bone marrow transplantation, often weaken the immune system. This makes it harder for your body to protect itself from foodborne illness, also called food poisoning. Foodborne illness is caused by eating food that contains harmful bacteria, parasites, or viruses.  Foods to avoid Some foods have a higher risk of becoming tainted with bacteria. These include: Marland Kitchen Unwashed fresh fruit and vegetables, especially leafy vegetables that can hide dirt and other contaminants . Raw sprouts, such as alfalfa sprouts . Raw or undercooked beef, especially ground beef, or other raw or undercooked meat and poultry . Fatty, fried, or spicy foods immediately before or after treatment.  These can sit heavy on your stomach and make you feel nauseous. . Raw or undercooked shellfish, such as oysters. . Sushi and sashimi, which often contain raw fish.  . Unpasteurized beverages, such as unpasteurized fruit juices, raw milk, raw yogurt, or cider . Undercooked eggs, such as soft boiled, over easy, and poached; raw, unpasteurized eggs; or foods made with raw egg, such as homemade raw cookie dough and homemade mayonnaise Simple steps for food safety Shop smart. . Do not buy food stored or displayed in an unclean  area. . Do not buy bruised or damaged fruits or vegetables. . Do not buy cans that have cracks, dents, or bulges. . Pick up foods that can spoil at the end of your shopping trip and store them in a cooler on the way home. Prepare and clean up foods carefully. . Rinse all fresh fruits and vegetables under running water, and dry them with a clean towel or paper towel. . Clean the top of cans before opening them. . After preparing food, wash your hands for 20 seconds with hot water and soap. Pay special attention to areas between fingers and under nails. . Clean your utensils and  dishes with hot water and soap. Marland Kitchen Disinfect your kitchen and cutting boards using 1 teaspoon of liquid, unscented bleach mixed into 1 quart of water.   Dispose of old food. . Eat canned and packaged food before its expiration date (the "use by" or "best before" date). . Consume refrigerated leftovers within 3 to 4 days. After that time, throw out the food. Even if the food does not smell or look spoiled, it still may be unsafe. Some bacteria, such as Listeria, can grow even on foods stored in the refrigerator if they are kept for too long. Take precautions when eating out. . At restaurants, avoid buffets and salad bars where food sits out for a long time and comes in contact with many people. Food can become contaminated when someone with a virus, often a norovirus, or another "bug" handles it. . Put any leftover food in a "to-go" container yourself, rather than having the server do it. And, refrigerate leftovers as soon as you get home. . Choose restaurants that are clean and that are willing to prepare your food as you order it cooked.    MEDICATIONS:                                                                                                                                                              Zofran/Ondansetron 8mg  tablet. Take 1 tablet every 8 hours as needed for nausea/vomiting. (#1 nausea med to take,  this can constipate)  Compazine/Prochlorperazine 10mg  tablet. Take 1 tablet every 6 hours as needed for nausea/vomiting. (#2 nausea med to take, this can make you sleepy)   EMLA cream. Apply a quarter size amount to port site 1 hour prior to chemo. Do not rub in. Cover with plastic wrap.   Over-the-Counter Meds:  Miralax 1 capful in 8 oz of fluid daily. May increase to two times a day if needed. This is a stool softener. If this doesn't work proceed you can add:  Senokot S-start with 1 tablet two times a day and increase to 4 tablets two times a day if needed. (total of 8 tablets in a 24 hour period). This is a stimulant laxative.   Call us if this does not help your bowels move.   Imodium 2mg  capsule. Take 2 capsules after the 1st loose stool and then 1 capsule every 2 hours until you go a total of 12 hours without having a loose stool. Call the [facility] if loose stools continue. If diarrhea occurs @ bedtime, take 2 capsules @ bedtime. Then take 2 capsules every 4 hours until morning. Call [facility].     Diarrhea Sheet  If you are having loose stools/diarrhea, please purchase Imodium and begin taking as outlined:  At the first sign of poorly formed or loose  stools you should begin taking Imodium(loperamide) 2 mg capsules.  Take two caplets (4mg ) followed by one caplet (2mg ) every 2 hours until you have had no diarrhea for 12 hours.  During the night take two caplets (4mg ) at bedtime and continue every 4 hours during the night until the morning.  Stop taking Imodium only after there is no sign of diarrhea for 12 hours.    Always call the [facility] if you are having loose stools/diarrhea that you can't get under control.  Loose stools/disrrhea leads to dehydration (loss of water) in your body.  We have other options of trying to get the loose stools/diarrhea to stopped but you must let us know!     Constipation Sheet *Miralax in 8 oz of fluid daily.  May increase to two  times a day if needed.  This is a stool softener.  If this not enough to keep your bowel regular:  You can add:  *Senokot S, start with one tablet twice a day and can increase to 4 tablets twice a day if needed.  This is a stimulant laxative.   Sometimes when you take pain medication you need BOTH a medicine to keep your stool soft and a medicine to help your bowel push it out!  Please call if the above does not work for you.   Do not go more than 2 days without a bowel movement.  It is very important that you do not become constipated.  It will make you feel sick to your stomach (nausea) and can cause abdominal pain and vomiting.      Nausea Sheet  Zofran/Ondansetron 8mg  tablet. Take 1 tablet every 8 hours as needed for nausea/vomiting. (#1 nausea med to take, this can constipate)  Compazine/Prochlorperazine 10mg  tablet. Take 1 tablet every 6 hours as needed for nausea/vomiting. (#2 nausea med to take, this can make you sleepy)  You can take these medications together or separately.  We would first like for you to try the Ondansetron by itself and then take the Prochloperizine if needed. But you are allowed to take both medications at the same time if your nausea is that severe.  If you are having persistent nausea (nausea that does not stop) please take these medications on a staggered schedule so that the nausea medication stays in your body.  Please call the [facility] and let us know the amount of nausea that you are experiencing.  If you begin to vomit, you need to call the [facility] and if it is the weekend and you have vomited more than one time and cant get it to stop-go to the Emergency Room.  Persistent nausea/vomiting can lead to dehydration (loss of fluid in your body) and will make you feel terrible.   Ice chips, sips of clear liquids, foods that are @ room temperature, crackers, and toast tend to be better tolerated.     SYMPTOMS TO REPORT AS SOON AS POSSIBLE AFTER  TREATMENT:  FEVER GREATER THAN 100.5 F  CHILLS WITH OR WITHOUT FEVER  NAUSEA AND VOMITING THAT IS NOT CONTROLLED WITH YOUR NAUSEA MEDICATION  UNUSUAL SHORTNESS OF BREATH  UNUSUAL BRUISING OR BLEEDING  TENDERNESS IN MOUTH AND THROAT WITH OR WITHOUT PRESENCE OF ULCERS  URINARY PROBLEMS  BOWEL PROBLEMS  UNUSUAL RASH    Wear comfortable clothing and clothing appropriate for easy access to any Portacath or PICC line. Let us know if there is anything that we can do to make your therapy better!  What to do if you need assistance after hours or on the weekends: CALL [phone].  HOLD on the line, do not hang up.  You will hear multiple messages but at the end you will be connected with a nurse triage line.  They will contact Dr Whitney Muse if necessary.  Most of the time they will be able to assist you.   Do not call the hospital operator.  Dr Whitney Muse will not answer phone calls received by them.     I have been informed and understand all of the instructions given to me and have received a copy. I have been instructed to call the clinic [phone] or my family physician as soon as possible for continued medical care, if indicated. I do not have any more questions at this time but understand that I may call the [facility] or the Patient Navigator at [phone] during office hours should I have questions or need assistance in obtaining follow-up care.

## 2016-05-26 ENCOUNTER — Encounter (HOSPITAL_COMMUNITY): Payer: Self-pay | Admitting: Emergency Medicine

## 2016-05-26 MED ORDER — ONDANSETRON HCL 8 MG PO TABS: 8.0000 mg | ORAL_TABLET | Freq: Three times a day (TID) | ORAL | 2 refills | Status: DC | PRN

## 2016-05-26 MED ORDER — PROCHLORPERAZINE MALEATE 10 MG PO TABS: 10.0000 mg | ORAL_TABLET | Freq: Four times a day (QID) | ORAL | 2 refills | Status: DC | PRN

## 2016-05-26 MED ORDER — LIDOCAINE-PRILOCAINE 2.5-2.5 % EX CREA: TOPICAL_CREAM | CUTANEOUS | 2 refills | Status: DC

## 2016-05-26 NOTE — Progress Notes (Signed)
Chemotherapy teaching pulled together. 

## 2016-05-26 NOTE — H&P (Signed)
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 Patient denies any prior indwelling catheters, DVT, or unilateral upper extremity or head/neck swelling, reports his heartburn has recently been controlled, and he denies CP, SOB, or fever/chills.    Review of Symptoms:  Constitutional:No fevers, chills, or unexplained weight loss Head:Atraumatic; no masses; no abnormalities Eyes:No visual changes or eye pain Cardiovascular: No chest pain or palpitations.  Respiratory:No cough, shortness of breath or wheezing  GastrointestinNo diarrhea, constipation, blood in stools, abdominal pain, vomiting or heartburn Musculoskeletal:No arthalgias, myalgias or joint swelling Skin:No rash or bothersome skin lesions   Past Medical History:Reviewed  Past Medical History  Surgical History:  EGD with biopsies (03/2016), transurethral resection of prostate and radioactive seeds implantation Medical Problems:  HTN, HLD, esophogeal squamous cell carcinoma, alcoholic cirrhosis, GERD, psoriasis, prostate cancer, tobacco abuse, and alcohol withdrawal-associated seizures (2013), paroxysmal supraventricular tachycardia (2016), and thrombocytopenia (2013) Psychiatric History:  generalized anxiety disorder and major depression disorder Allergies:   NKDA Medications:   amlodipine, pantoprazole, Flomax   Social History:Reviewed  Social History  Preferred Language: English Race:  Black or African American Ethnicity: Not Hispanic / Latino Age: 61 year Marital Status:  S Alcohol:  former alcohol abuse, reports cessation in 2015  Smoking Status: Heavy tobacco smoker reviewed on 05/25/2016 Started Date: 05/26/1969 Packs per day: 0.75  Functional  Status ------------------------------------------------ Bathing: Normal Cooking: Normal Dressing: Normal Driving: Normal Eating: Normal Managing Meds: Normal Oral Care: Normal Shopping: Normal Toileting: Normal Transferring: Normal Walking: Normal  Cognitive Status ------------------------------------------------ Attention: Normal Decision Making: Normal Language: Normal Memory: Normal Motor: Normal Perception: Normal Problem Solving: Normal Visual and Spatial: Normal   Family History:Reviewed  Family Health History Mother, Deceased; Alcoholism;  Father, Deceased; History Unknown Sister, Living; Diabetes mellitus, unspecified type;   Vital Signs as of [id number]: Systolic 123456: Diastolic 69: Heart Rate 87: Temp 99.62F (Temporal) Height 70ft 10.5in: Weight 147Lbs 0 Ounces: BMI 20.79 kg/m2  Physical Exam: General:Well appearing, well nourished in no distress. Skin:no rash or prominent lesions Head:Atraumatic; no masses; no abnormalities Eyes:conjunctiva clear, EOM intact, PERRL Heart:RRR, no murmur Lungs:CTA bilaterally, no wheezes, rhonchi, rales.  Breathing unlabored. Abdomen:Soft, NT/ND, no HSM, no masses. Extremities:No deformities, clubbing, cyanosis, or edema.   Assessment: 61 year old Male requiring central venous access with subcutaneous port for chemotherapy following recent biopsy-proven diagnosis of esophogeal squamous cell carcinoma, complicated by pertinent comorbidities including HTN, HLD, esophogeal squamous cell carcinoma, alcoholic cirrhosis, GERD, psoriasis, prostate cancer, tobacco abuse, and alcohol withdrawal-associated seizures (2013), paroxysmal supraventricular tachycardia (2016), and thrombocytopenia (2013).  Plan:      - all risks, benefits, and alternatives to placement of central venous catheter with subcutaneous port for chemotherapy access were discussed with the patient, who elects to proceed, and informed consent was  accorindgly obtained      - will plan for placement of central venous catheter with subcutaneous port at earliest OR availability, 1/8 (transportation arranged for radiation therapy scheduled at [facility] to follow procedure)      - patient was advised that all of his questions pertaining specifically to duration and intervals of chemotherapy wound need to be asked to his medical oncologist      - surgical follow-up 2 weeks after above planned procedure      - instructions given to call office if questions/concerns      -  smoking cessation discussed and encouraged  -- Corene Cornea E. Rosana Hoes, MD, [place]: [facility] General Surgery and Vascular Care Office #: [phone]

## 2016-05-26 NOTE — Patient Instructions (Signed)
Stanley Jimenez  05/26/2016     @PREFPERIOPPHARMACY @   Your procedure is scheduled on  05/31/2016   Report to [facility] at  11  A.M.  Call this number if you have problems the morning of surgery:  [phone]   Remember:  Do not eat food or drink liquids after midnight.  Take these medicines the morning of surgery with A SIP OF WATER  Amlodipine, protonix, flomax.   Do not wear jewelry, make-up or nail polish.  Do not wear lotions, powders, or perfumes, or deoderant.  Do not shave 48 hours prior to surgery.  Men may shave face and neck.  Do not bring valuables to the hospital.  [facility] is not responsible for any belongings or valuables.  Contacts, dentures or bridgework may not be worn into surgery.  Leave your suitcase in the car.  After surgery it may be brought to your room.  For patients admitted to the hospital, discharge time will be determined by your treatment team.  Patients discharged the day of surgery will not be allowed to drive home.   Name and phone number of your driver:   family Special instructions:  none  Please read over the following fact sheets that you were given. Anesthesia Post-op Instructions and Care and Recovery After Surgery       Implanted Port Insertion An implanted port is a central line that has a round shape and is placed under the skin. It is used as a long-term IV access for:   Medicines, such as chemotherapy.   Fluids.   Liquid nutrition, such as total parenteral nutrition (TPN).   Blood samples.  LET [facility] CARE PROVIDER KNOW ABOUT:  Allergies to food or medicine.   Medicines taken, including vitamins, herbs, eye drops, creams, and over-the-counter medicines.   Any allergies to heparin.  Use of steroids (by mouth or creams).   Previous problems with anesthetics or numbing medicines.   History of bleeding problems or blood clots.   Previous surgery.   Other health problems,  including diabetes and kidney problems.   Possibility of pregnancy, if this applies. RISKS AND COMPLICATIONS Generally, this is a safe procedure. However, as with any procedure, problems can occur. Possible problems include:  Damage to the blood vessel, bruising, or bleeding at the puncture site.   Infection.  Blood clot in the vessel that the port is in.  Breakdown of the skin over your port.  Very rarely a person may develop a condition called a pneumothorax, a collection of air in the chest that may cause one of the lungs to collapse. The placement of these catheters with the appropriate imaging guidance significantly decreases the risk of a pneumothorax.  BEFORE THE PROCEDURE   Your health care provider may want you to have blood tests. These tests can help tell how well your kidneys and liver are working. They can also show how well your blood clots.   If you take blood thinners (anticoagulant medicines), ask your health care provider when you should stop taking them.   Make arrangements for someone to drive you home. This is necessary if you have been sedated for your procedure.  PROCEDURE  Port insertion usually takes about 30-45 minutes.   An IV needle will be inserted in your arm. Medicine for pain and medicine to help relax you (sedative) will flow directly into your body through this needle.   You will lie  on an exam table, and you will be connected to monitors to keep track of your heart rate, blood pressure, and breathing throughout the procedure.  An oxygen monitoring device may be attached to your finger. Oxygen will be given.   Everything will be kept as germ free (sterile) as possible during the procedure. The skin near the point of the incision will be cleansed with antiseptic, and the area will be draped with sterile towels. The skin and deeper tissues over the port area will be made numb with a local anesthetic.  Two small cuts (incisions) will be made in  the skin to insert the port. One will be made in the neck to obtain access to the vein where the catheter will lie.   Because the port reservoir will be placed under the skin, a small skin incision will be made in the upper chest, and a small pocket for the port will be made under the skin. The catheter that will be connected to the port tunnels to a large central vein in the chest. A small, raised area will remain on your body at the site of the reservoir when the procedure is complete.  The port placement will be done under imaging guidance to ensure the proper placement.  The reservoir has a silicone covering that can be punctured with a special needle.   The port will be flushed with normal saline, and blood will be drawn to make sure it is working properly.  There will be nothing remaining outside the skin when the procedure is finished.   Incisions will be held together by stitches, surgical glue, or a special tape. AFTER THE PROCEDURE  You will stay in a recovery area until the anesthesia has worn off. Your blood pressure and pulse will be checked.  A final chest X-ray will be taken to check the placement of the port and to ensure that there is no injury to your lung. This information is not intended to replace advice given to you by your health care provider. Make sure you discuss any questions you have with your health care provider. Document Released: 02/28/2013 Document Revised: 05/31/2014 Document Reviewed: 02/28/2013 Elsevier Interactive Patient Education  2017 [place] Insertion, Care After Refer to this sheet in the next few weeks. These instructions provide you with information on caring for yourself after your procedure. Your health care provider may also give you more specific instructions. Your treatment has been planned according to current medical practices, but problems sometimes occur. Call your health care provider if you have any problems or  questions after your procedure. WHAT TO EXPECT AFTER THE PROCEDURE After your procedure, it is typical to have the following:   Discomfort at the port insertion site. Ice packs to the area will help.  Bruising on the skin over the port. This will subside in 3-4 days. HOME CARE INSTRUCTIONS  After your port is placed, you will get a manufacturer's information card. The card has information about your port. Keep this card with you at all times.   Know what kind of port you have. There are many types of ports available.   Wear a medical alert bracelet in case of an emergency. This can help alert health care workers that you have a port.   The port can stay in for as long as your health care provider believes it is necessary.   A home health care nurse may give medicines and take care of the port.  You or a family member can get special training and directions for giving medicine and taking care of the port at home.  SEEK MEDICAL CARE IF:   Your port does not flush or you are unable to get a blood return.   You have a fever or chills. SEEK IMMEDIATE MEDICAL CARE IF:  You have new fluid or pus coming from your incision.   You notice a bad smell coming from your incision site.   You have swelling, pain, or more redness at the incision or port site.   You have chest pain or shortness of breath. This information is not intended to replace advice given to you by your health care provider. Make sure you discuss any questions you have with your health care provider. Document Released: 02/28/2013 Document Revised: 05/15/2013 Document Reviewed: 02/28/2013 Elsevier Interactive Patient Education  2017 [place] An implanted port is a type of central line that is placed under the skin. Central lines are used to provide IV access when treatment or nutrition needs to be given through a person's veins. Implanted ports are used for long-term IV access. An  implanted port may be placed because:   You need IV medicine that would be irritating to the small veins in your hands or arms.   You need long-term IV medicines, such as antibiotics.   You need IV nutrition for a long period.   You need frequent blood draws for lab tests.   You need dialysis.  Implanted ports are usually placed in the chest area, but they can also be placed in the upper arm, the abdomen, or the leg. An implanted port has two main parts:   Reservoir. The reservoir is round and will appear as a small, raised area under your skin. The reservoir is the part where a needle is inserted to give medicines or draw blood.   Catheter. The catheter is a thin, flexible tube that extends from the reservoir. The catheter is placed into a large vein. Medicine that is inserted into the reservoir goes into the catheter and then into the vein.  HOW WILL I CARE FOR MY INCISION SITE? Do not get the incision site wet. Bathe or shower as directed by your health care provider.  HOW IS MY PORT ACCESSED? Special steps must be taken to access the port:   Before the port is accessed, a numbing cream can be placed on the skin. This helps numb the skin over the port site.   Your health care provider uses a sterile technique to access the port.  Your health care provider must put on a mask and sterile gloves.  The skin over your port is cleaned carefully with an antiseptic and allowed to dry.  The port is gently pinched between sterile gloves, and a needle is inserted into the port.  Only "non-coring" port needles should be used to access the port. Once the port is accessed, a blood return should be checked. This helps ensure that the port is in the vein and is not clogged.   If your port needs to remain accessed for a constant infusion, a clear (transparent) bandage will be placed over the needle site. The bandage and needle will need to be changed every week, or as directed by your  health care provider.   Keep the bandage covering the needle clean and dry. Do not get it wet. Follow your health care provider's instructions on how to take a shower  or bath while the port is accessed.   If your port does not need to stay accessed, no bandage is needed over the port.  WHAT IS FLUSHING? Flushing helps keep the port from getting clogged. Follow your health care provider's instructions on how and when to flush the port. Ports are usually flushed with saline solution or a medicine called heparin. The need for flushing will depend on how the port is used.   If the port is used for intermittent medicines or blood draws, the port will need to be flushed:   After medicines have been given.   After blood has been drawn.   As part of routine maintenance.   If a constant infusion is running, the port may not need to be flushed.  HOW LONG WILL MY PORT STAY IMPLANTED? The port can stay in for as long as your health care provider thinks it is needed. When it is time for the port to come out, surgery will be done to remove it. The procedure is similar to the one performed when the port was put in.  WHEN SHOULD I SEEK IMMEDIATE MEDICAL CARE? When you have an implanted port, you should seek immediate medical care if:   You notice a bad smell coming from the incision site.   You have swelling, redness, or drainage at the incision site.   You have more swelling or pain at the port site or the surrounding area.   You have a fever that is not controlled with medicine. This information is not intended to replace advice given to you by your health care provider. Make sure you discuss any questions you have with your health care provider. Document Released: 05/10/2005 Document Revised: 02/28/2013 Document Reviewed: 01/15/2013 Elsevier Interactive Patient Education  2017 [employer] PATIENT INSTRUCTIONS POST-ANESTHESIA  IMMEDIATELY FOLLOWING SURGERY:  Do not drive or operate  machinery for the first twenty four hours after surgery.  Do not make any important decisions for twenty four hours after surgery or while taking narcotic pain medications or sedatives.  If you develop intractable nausea and vomiting or a severe headache please notify your doctor immediately.  FOLLOW-UP:  Please make an appointment with your surgeon as instructed. You do not need to follow up with anesthesia unless specifically instructed to do so.  WOUND CARE INSTRUCTIONS (if applicable):  Keep a dry clean dressing on the anesthesia/puncture wound site if there is drainage.  Once the wound has quit draining you may leave it open to air.  Generally you should leave the bandage intact for twenty four hours unless there is drainage.  If the epidural site drains for more than 36-48 hours please call the anesthesia department.  QUESTIONS?:  Please feel free to call your physician or the hospital operator if you have any questions, and they will be happy to assist you.

## 2016-05-27 ENCOUNTER — Encounter (HOSPITAL_COMMUNITY): Payer: Self-pay

## 2016-05-27 ENCOUNTER — Ambulatory Visit (HOSPITAL_COMMUNITY): Admit: 2016-05-27 | Payer: Medicaid Other | Admitting: Gastroenterology

## 2016-05-27 ENCOUNTER — Other Ambulatory Visit (HOSPITAL_COMMUNITY): Payer: Self-pay | Admitting: Hematology & Oncology

## 2016-05-27 ENCOUNTER — Encounter (HOSPITAL_COMMUNITY)
Admission: RE | Admit: 2016-05-27 | Discharge: 2016-05-27 | Disposition: A | Discharge status: Home / Self Care | Payer: Medicaid Other | Source: Ambulatory Visit | Attending: Surgery | Admitting: Surgery

## 2016-05-27 DIAGNOSIS — Z01810 Encounter for preprocedural cardiovascular examination: Secondary | ICD-10-CM | POA: Insufficient documentation

## 2016-05-27 HISTORY — DX: Unspecified convulsions: R56.9

## 2016-05-27 SURGERY — UPPER ENDOSCOPIC ULTRASOUND (EUS) RADIAL
Anesthesia: Monitor Anesthesia Care

## 2016-05-27 NOTE — Progress Notes (Signed)
START ON PATHWAY REGIMEN - Gastroesophageal  [id number]: Carboplatin + Paclitaxel (2/50) Weekly (x 5-6 Weeks) with Concurrent RT   Administer weekly during RT:     Paclitaxel (Taxol(R)) 50 mg/m2 in 250 mL NS IV over 60 minutes weekly with concurrent RT Dose Mod: None     Carboplatin (Paraplatin(R)) AUC=2 in 100 mL NS IV over 30 minutes weekly with concurrent RT Dose Mod: None Additional Orders: Given with concurrent chemoradiotherapy. GCSF therapy with RT is a relative contraindication.  **Always confirm dose/schedule in your pharmacy ordering system**    Patient Characteristics: Esophageal, Squamous Cell, T3 or Higher or N+, Surgical Candidate, Preoperative Therapy Histology: Squamous Cell Disease Classification: Esophageal AJCC M Stage: 0 AJCC N Stage: 1 AJCC Stage Grouping: IIIA Current Disease Status: No Distant Mets or Local Recurrence Tumor Grade: X AJCC T Stage: 3 Tumor Location: X Patient Characteristics: Surgical Candidate Type of Therapy: Preoperative Therapy  Intent of Therapy: Curative Intent, Discussed with Patient

## 2016-05-28 ENCOUNTER — Encounter (HOSPITAL_COMMUNITY): Payer: Self-pay | Admitting: Emergency Medicine

## 2016-05-28 NOTE — Progress Notes (Signed)
error 

## 2016-05-31 ENCOUNTER — Encounter (HOSPITAL_COMMUNITY)
Admission: RE | Disposition: A | Discharge status: Home / Self Care | Payer: Self-pay | Source: Ambulatory Visit | Attending: Surgery

## 2016-05-31 ENCOUNTER — Ambulatory Visit (HOSPITAL_COMMUNITY): Payer: Medicaid Other

## 2016-05-31 ENCOUNTER — Ambulatory Visit (HOSPITAL_COMMUNITY): Payer: Medicaid Other | Admitting: Anesthesiology

## 2016-05-31 ENCOUNTER — Ambulatory Visit (HOSPITAL_COMMUNITY)
Admission: RE | Admit: 2016-05-31 | Discharge: 2016-05-31 | Disposition: A | Discharge status: Home / Self Care | Payer: Medicaid Other | Source: Ambulatory Visit | Attending: Surgery | Admitting: Surgery

## 2016-05-31 ENCOUNTER — Encounter (HOSPITAL_COMMUNITY): Payer: Self-pay | Admitting: Anesthesiology

## 2016-05-31 DIAGNOSIS — Z8546 Personal history of malignant neoplasm of prostate: Secondary | ICD-10-CM | POA: Insufficient documentation

## 2016-05-31 DIAGNOSIS — F17210 Nicotine dependence, cigarettes, uncomplicated: Secondary | ICD-10-CM | POA: Insufficient documentation

## 2016-05-31 DIAGNOSIS — K219 Gastro-esophageal reflux disease without esophagitis: Secondary | ICD-10-CM | POA: Diagnosis not present

## 2016-05-31 DIAGNOSIS — Z79899 Other long term (current) drug therapy: Secondary | ICD-10-CM | POA: Diagnosis not present

## 2016-05-31 DIAGNOSIS — Z452 Encounter for adjustment and management of vascular access device: Secondary | ICD-10-CM | POA: Diagnosis not present

## 2016-05-31 DIAGNOSIS — C153 Malignant neoplasm of upper third of esophagus: Secondary | ICD-10-CM

## 2016-05-31 DIAGNOSIS — I10 Essential (primary) hypertension: Secondary | ICD-10-CM | POA: Diagnosis not present

## 2016-05-31 DIAGNOSIS — Z789 Other specified health status: Secondary | ICD-10-CM

## 2016-05-31 DIAGNOSIS — C159 Malignant neoplasm of esophagus, unspecified: Secondary | ICD-10-CM | POA: Diagnosis present

## 2016-05-31 DIAGNOSIS — Z95828 Presence of other vascular implants and grafts: Secondary | ICD-10-CM

## 2016-05-31 HISTORY — PX: PORTACATH PLACEMENT: SHX2246

## 2016-05-31 LAB — POCT I-STAT 4, (NA,K, GLUC, HGB,HCT)
GLUCOSE: 92 mg/dL (ref 65–99)
HCT: 38 % — ABNORMAL LOW (ref 39.0–52.0)
Hemoglobin: 12.9 g/dL — ABNORMAL LOW (ref 13.0–17.0)
Potassium: 3.8 mmol/L (ref 3.5–5.1)
Sodium: 140 mmol/L (ref 135–145)

## 2016-05-31 SURGERY — INSERTION, TUNNELED CENTRAL VENOUS DEVICE, WITH PORT
Anesthesia: Monitor Anesthesia Care | Site: Chest | Laterality: Right

## 2016-05-31 MED ORDER — LIDOCAINE HCL (PF) 1 % IJ SOLN
INTRAMUSCULAR | Status: AC
  Filled 2016-05-31: qty 30

## 2016-05-31 MED ORDER — CEFAZOLIN SODIUM-DEXTROSE 2-4 GM/100ML-% IV SOLN
2.0000 g | INTRAVENOUS | Status: AC
  Administered 2016-05-31: 2 g via INTRAVENOUS
  Filled 2016-05-31: qty 100

## 2016-05-31 MED ORDER — PROPOFOL 500 MG/50ML IV EMUL
INTRAVENOUS | Status: DC | PRN
  Administered 2016-05-31: 50 ug/kg/min via INTRAVENOUS

## 2016-05-31 MED ORDER — SODIUM CHLORIDE 0.9 % IV SOLN
INTRAVENOUS | Status: DC | PRN
  Administered 2016-05-31: 500 mL via INTRAMUSCULAR

## 2016-05-31 MED ORDER — HEPARIN SOD (PORK) LOCK FLUSH 100 UNIT/ML IV SOLN
INTRAVENOUS | Status: DC | PRN
  Administered 2016-05-31: 500 [IU]

## 2016-05-31 MED ORDER — LIDOCAINE HCL (PF) 1 % IJ SOLN
INTRAMUSCULAR | Status: AC
  Filled 2016-05-31: qty 5

## 2016-05-31 MED ORDER — CHLORHEXIDINE GLUCONATE CLOTH 2 % EX PADS: 6.0000 | MEDICATED_PAD | Freq: Once | CUTANEOUS | Status: DC

## 2016-05-31 MED ORDER — LIDOCAINE HCL 1 % IJ SOLN
INTRAMUSCULAR | Status: DC | PRN
  Administered 2016-05-31: 5 mL
  Administered 2016-05-31: 10 mL

## 2016-05-31 MED ORDER — BUPIVACAINE HCL (PF) 0.5 % IJ SOLN
INTRAMUSCULAR | Status: AC
  Filled 2016-05-31: qty 30

## 2016-05-31 MED ORDER — FENTANYL CITRATE (PF) 100 MCG/2ML IJ SOLN
INTRAMUSCULAR | Status: AC
  Filled 2016-05-31: qty 2

## 2016-05-31 MED ORDER — MIDAZOLAM HCL 2 MG/2ML IJ SOLN
1.0000 mg | INTRAMUSCULAR | Status: DC | PRN
  Administered 2016-05-31: 2 mg via INTRAVENOUS

## 2016-05-31 MED ORDER — HEPARIN SOD (PORK) LOCK FLUSH 100 UNIT/ML IV SOLN
INTRAVENOUS | Status: AC
  Filled 2016-05-31: qty 5

## 2016-05-31 MED ORDER — PROPOFOL 10 MG/ML IV BOLUS
INTRAVENOUS | Status: DC | PRN
Start: 2016-05-31 — End: 2016-05-31
  Administered 2016-05-31 (×2): 10 mg via INTRAVENOUS

## 2016-05-31 MED ORDER — LACTATED RINGERS IV SOLN
INTRAVENOUS | Status: DC
  Administered 2016-05-31: 07:00:00 via INTRAVENOUS

## 2016-05-31 MED ORDER — MIDAZOLAM HCL 2 MG/2ML IJ SOLN
INTRAMUSCULAR | Status: AC
  Filled 2016-05-31: qty 2

## 2016-05-31 MED ORDER — FENTANYL CITRATE (PF) 100 MCG/2ML IJ SOLN
25.0000 ug | INTRAMUSCULAR | Status: AC | PRN
  Administered 2016-05-31 (×2): 25 ug via INTRAVENOUS

## 2016-05-31 MED ORDER — PROPOFOL 10 MG/ML IV BOLUS
INTRAVENOUS | Status: AC
  Filled 2016-05-31: qty 20

## 2016-05-31 SURGICAL SUPPLY — 36 items
APPLIER CLIP 9.375 SM OPEN (CLIP)
BAG DECANTER FOR FLEXI CONT (MISCELLANEOUS) ×3 IMPLANT
BAG HAMPER (MISCELLANEOUS) ×3 IMPLANT
BLADE SURG SZ11 CARB STEEL (BLADE) ×3 IMPLANT
CHLORAPREP W/TINT 10.5 ML (MISCELLANEOUS) ×6 IMPLANT
CLIP APPLIE 9.375 SM OPEN (CLIP) IMPLANT
CLOTH BEACON ORANGE TIMEOUT ST (SAFETY) ×3 IMPLANT
COVER LIGHT HANDLE STERIS (MISCELLANEOUS) ×6 IMPLANT
DECANTER SPIKE VIAL GLASS SM (MISCELLANEOUS) ×6 IMPLANT
DERMABOND ADVANCED (GAUZE/BANDAGES/DRESSINGS) ×4
DERMABOND ADVANCED .7 DNX12 (GAUZE/BANDAGES/DRESSINGS) ×2 IMPLANT
DRAPE C-ARM FOLDED MOBILE STRL (DRAPES) ×3 IMPLANT
ELECT REM PT RETURN 9FT ADLT (ELECTROSURGICAL) ×3
ELECTRODE REM PT RTRN 9FT ADLT (ELECTROSURGICAL) ×1 IMPLANT
GLOVE BIOGEL PI IND STRL 7.0 (GLOVE) ×1 IMPLANT
GLOVE BIOGEL PI IND STRL 7.5 (GLOVE) ×1 IMPLANT
GLOVE BIOGEL PI INDICATOR 7.0 (GLOVE) ×2
GLOVE BIOGEL PI INDICATOR 7.5 (GLOVE) ×2
GLOVE ECLIPSE 6.5 STRL STRAW (GLOVE) ×3 IMPLANT
GLOVE ECLIPSE 7.0 STRL STRAW (GLOVE) ×3 IMPLANT
GLOVE EXAM NITRILE MD LF STRL (GLOVE) ×3 IMPLANT
GOWN STRL REUS W/TWL LRG LVL3 (GOWN DISPOSABLE) ×6 IMPLANT
IV NS 500ML (IV SOLUTION) ×2
IV NS 500ML BAXH (IV SOLUTION) ×1 IMPLANT
KIT PORT POWER 8FR ISP MRI (Port) ×3 IMPLANT
KIT ROOM TURNOVER APOR (KITS) ×3 IMPLANT
MANIFOLD NEPTUNE II (INSTRUMENTS) ×3 IMPLANT
NEEDLE HYPO 25X1 1.5 SAFETY (NEEDLE) ×3 IMPLANT
PACK MINOR (CUSTOM PROCEDURE TRAY) ×3 IMPLANT
PAD ARMBOARD 7.5X6 YLW CONV (MISCELLANEOUS) ×3 IMPLANT
SET BASIN LINEN APH (SET/KITS/TRAYS/PACK) ×3 IMPLANT
SUT VIC AB 3-0 SH 27 (SUTURE) ×2
SUT VIC AB 3-0 SH 27X BRD (SUTURE) ×1 IMPLANT
SUT VIC AB 4-0 PS2 27 (SUTURE) ×3 IMPLANT
SYR 20CC LL (SYRINGE) ×3 IMPLANT
SYR CONTROL 10ML LL (SYRINGE) ×3 IMPLANT

## 2016-05-31 NOTE — Op Note (Signed)
SURGICAL PROCEDURE REPORT  DATE OF PROCEDURE: 05/31/2016   ATTENDING SURGEON: Corene Cornea E. Rosana Hoes, MD   ANESTHESIA: Local with light IV sedation   PRE-OPERATIVE DIAGNOSIS: Advanced esophageal cancer requiring durable central venous access for chemotherapy (ICD-10's: C15.4)  POST-OPERATIVE DIAGNOSIS: Advanced esophageal cancer requiring durable central venous access for chemotherapy (ICD-10's: C15.4)  PROCEDURE(S): (cpt: 36561) 1.) Percutaneous access of Right internal jugular vein under ultrasound guidance  2.) Insertion of tunneled Right internal jugular Bard PowerPort central venous catheter with subcutaneous port  INTRAOPERATIVE FINDINGS: Patent easily compressible Right internal jugular vein with appropriate respiratory variations and well-secured tunneled central venous catheter with subcutaneous port at completion of the procedure  INTRAOPERATIVE FLUIDS: 500 mL crystalloid, 0 mL contrast used   FLUOROSCOPY: 0.8 seconds, 0.13 mGy  ESTIMATED BLOOD LOSS: Minimal (<20 mL)   SPECIMENS: None   IMPLANTS: 36F tunneled Bard PowerPort central venous catheter with subcutaneous port  DRAINS: None   COMPLICATIONS: None apparent   CONDITION AT COMPLETION: Hemodynamically stable, awake   DISPOSITION: PACU   INDICATION(S) FOR PROCEDURE:  Patient is a 61 y.o. male who presented with advanced esophageal cancer requiring durable central venous access for chemotherapy. All risks, benefits, and alternatives to above elective procedures were discussed with the patient, who elected to proceed, and informed consent was accordingly obtained at that time.  DETAILS OF PROCEDURE:  Patient was brought to the operative suite and appropriately identified. In Trendelenburg position, Right IJ venous access site was prepped and draped in the usual sterile fashion, and following a brief timeout, limited duplex evaluation of the Right internal jugular vein was performed. Percutaneous Right IJ venous access was  obtained under ultrasound guidance using Seldinger technique, by which local anesthetic was injected over the Right IJ vein, and access needle was inserted under direct ultrasound visualization into the Right IJ vein, through which soft guidewire was advanced, over which access needle was withdrawn. Guidewire was secured, attention was directed to injection of local anesthetic along the planned tunnel site, 2-3 cm transverse Right chest incision was made and confirmed to accommodate the subcutaneous port, and flushed catheter was tunneled retrograde from the port site over the Right chest to the Right IJ access site with the attached port well-secured to the catheter and within the subcutaneous pocket. Insertion sheath was advanced over the guidewire, which was withdrawn along with the insertion sheath dilator. Length of catheter needed to position the catheter tip at the atrio-caval junction was then measured under direct fluoroscopic visualization, after which the catheter was cut to the measured length and advanced through the sheath into the Right internal jugular vein and SVC without evidence of cardiac arrhythmias during the procedure. Port was confirmed to withdraw blood and flush easily, after which concentrated heparin was instilled into the port and catheter. Dermis at the subcutaneous pocket was re-approximated using buried interrupted 3-0 Vicryl suture, and 4-0 Vicryl suture was used to re-approximate skin at the insertion and subcutaneous port sites in running subcuticular fashion for the subcutaneous port and buried interrupted fashion for the insertion site. Skin was cleaned, dried, and sterile skin glue was applied. Patient was then safely transferred to PACU for a chest x-ray.  I was present for all aspects of the procedures, and there were no intraprocedural complications apparent.

## 2016-05-31 NOTE — Addendum Note (Signed)
Addendum  created 05/31/16 1000 by Ollen Bowl, CRNA   Charge Capture section accepted

## 2016-05-31 NOTE — Transfer of Care (Signed)
Immediate Anesthesia Transfer of Care Note  Patient: Stanley Jimenez  Procedure(s) Performed: Procedure(s): INSERTION OF TUNNELED CENTRAL VENOUS CATHETER WITH SUBCUTANEOUS PORT FOR CHEMOTHERAPY (Right)  Patient Location: PACU  Anesthesia Type:MAC  Level of Consciousness: awake and alert   Airway & Oxygen Therapy: Patient Spontanous Breathing  Post-op Assessment: Report given to RN  Post vital signs: Reviewed and stable  Last Vitals:  Vitals:   05/31/16 0735 05/31/16 0839  BP: 115/71 122/76  Pulse:  66  Resp: 14 15  Temp:  36.5 C    Last Pain: There were no vitals filed for this visit.       Complications: No apparent anesthesia complications

## 2016-05-31 NOTE — Anesthesia Postprocedure Evaluation (Signed)
Anesthesia Post Note  Patient: Stanley Jimenez  Procedure(s) Performed: Procedure(s) (LRB): INSERTION OF TUNNELED CENTRAL VENOUS CATHETER WITH SUBCUTANEOUS PORT FOR CHEMOTHERAPY (Right)  Patient location during evaluation: ICU Anesthesia Type: MAC Level of consciousness: awake and alert and oriented Pain management: pain level controlled Vital Signs Assessment: post-procedure vital signs reviewed and stable Respiratory status: spontaneous breathing Cardiovascular status: blood pressure returned to baseline Postop Assessment: no signs of nausea or vomiting Anesthetic complications: no     Last Vitals:  Vitals:   05/31/16 0735 05/31/16 0839  BP: 115/71 122/76  Pulse:  66  Resp: 14 15  Temp:  36.5 C    Last Pain: There were no vitals filed for this visit.               Emmali Karow

## 2016-05-31 NOTE — Anesthesia Preprocedure Evaluation (Signed)
Anesthesia Evaluation  Patient identified by MRN, date of birth, ID band Patient awake    Reviewed: Allergy & Precautions, NPO status , Patient's Chart, lab work & pertinent test results  Airway Mallampati: II  TM Distance: >3 FB Neck ROM: full    Dental  (+) Lower Dentures, Upper Dentures, Dental Advisory Given   Pulmonary Current Smoker,    Pulmonary exam normal breath sounds clear to auscultation       Cardiovascular Exercise Tolerance: Good hypertension, Pt. on medications + dysrhythmias Supra Ventricular Tachycardia and Ventricular Tachycardia  Rhythm:regular Rate:Normal     Neuro/Psych Seizures -,  PSYCHIATRIC DISORDERS Anxiety Depression    GI/Hepatic (+) Cirrhosis     substance abuse  alcohol use,   Endo/Other    Renal/GU      Musculoskeletal  (+) Arthritis , Osteoarthritis,    Abdominal   Peds  Hematology   Anesthesia Other Findings Esophageal CA   Reproductive/Obstetrics                             Anesthesia Physical Anesthesia Plan  ASA: IV  Anesthesia Plan: MAC   Post-op Pain Management:    Induction: Intravenous  Airway Management Planned: Simple Face Mask  Additional Equipment:   Intra-op Plan:   Post-operative Plan:   Informed Consent: I have reviewed the patients History and Physical, chart, labs and discussed the procedure including the risks, benefits and alternatives for the proposed anesthesia with the patient or authorized representative who has indicated his/her understanding and acceptance.     Plan Discussed with:   Anesthesia Plan Comments:         Anesthesia Quick Evaluation

## 2016-05-31 NOTE — Interval H&P Note (Signed)
History and Physical Interval Note:  05/31/2016 7:21 AM  Stanley Jimenez  has presented today for surgery, with the diagnosis of esophageal cancer  The various methods of treatment have been discussed with the patient and family. After consideration of risks, benefits and other options for treatment, the patient has consented to  Procedure(s): INSERTION PORT-A-CATH (N/A) as a surgical intervention .  The patient's history has been reviewed, patient examined, no change in status, stable for surgery.  I have reviewed the patient's chart and labs.  Questions were answered to the patient's satisfaction.     Vickie Epley

## 2016-05-31 NOTE — Discharge Instructions (Signed)
In addition to included general post-operative instructions for Insertion of Central Venous Catheter with Subcutaneous Port for chemotherapy,  Diet: Resume home heart healthy diet.   Activity: No heavy lifting >20 pounds (children, pets, laundry, garbage) or strenuous activity until follow-up, but light activity and walking are encouraged. Do not drive or drink alcohol if taking narcotic pain medications.  Wound care: 2 days after surgery (Wednesday, 1/10), may shower/get incision wet with soapy water and pat dry (do not rub incisions), but no baths or submerging incision underwater until follow-up.   Medications: Resume all home medications. For mild to moderate pain: acetaminophen (Tylenol) or ibuprofen (if no kidney disease). Narcotic pain medications, if prescribed, can be used for severe pain, though may cause nausea, constipation, and drowsiness. Do not combine Tylenol and Percocet within a 6 hour period as Percocet contains Tylenol. If you do not need the narcotic pain medication, you do not need to fill the prescription.  Call office [phone]) at any time if any questions, worsening pain, fevers/chills, bleeding, drainage from incision site, or other concerns.

## 2016-06-01 ENCOUNTER — Encounter: Payer: Self-pay | Admitting: *Deleted

## 2016-06-01 ENCOUNTER — Encounter (HOSPITAL_COMMUNITY): Payer: Self-pay | Admitting: Surgery

## 2016-06-01 NOTE — Progress Notes (Signed)
[facility] Clinical Social Work  Clinical Social Work was referred by nurse for assessment of psychosocial needs due to transportation needs. Clinical Social Worker contacted patient at home to offer support and assess for needs.  Pt reports he has [facility] set up for all of his appointments through 07/08/16. He feels very comfortable riding [facility] and has used this regularly in the past. CSW reviewed role of CSW and how CSW could continue to assist as needed. CSW to follow and continue to help out as needed. Pt could be eligible for CNA assistance through medicaid if there is medical need for assistance.     Clinical Social Work interventions: Resource education Check in  [place], [place] Tuesdays   [phone]

## 2016-06-02 ENCOUNTER — Encounter (HOSPITAL_COMMUNITY): Payer: Medicaid Other | Attending: Oncology

## 2016-06-02 ENCOUNTER — Encounter (HOSPITAL_COMMUNITY): Payer: Self-pay

## 2016-06-02 ENCOUNTER — Encounter (HOSPITAL_COMMUNITY): Payer: Medicaid Other

## 2016-06-02 VITALS — BP 124/67 | HR 70 | Temp 98.0°F | Resp 18 | Wt 146.8 lb

## 2016-06-02 DIAGNOSIS — Z5111 Encounter for antineoplastic chemotherapy: Secondary | ICD-10-CM

## 2016-06-02 DIAGNOSIS — C159 Malignant neoplasm of esophagus, unspecified: Secondary | ICD-10-CM | POA: Insufficient documentation

## 2016-06-02 LAB — CBC WITH DIFFERENTIAL/PLATELET
BASOS ABS: 0 10*3/uL (ref 0.0–0.1)
BASOS PCT: 1 %
EOS ABS: 0.1 10*3/uL (ref 0.0–0.7)
EOS PCT: 2 %
HCT: 35 % — ABNORMAL LOW (ref 39.0–52.0)
Hemoglobin: 11.8 g/dL — ABNORMAL LOW (ref 13.0–17.0)
Lymphocytes Relative: 30 %
Lymphs Abs: 2.1 10*3/uL (ref 0.7–4.0)
MCH: 29.4 pg (ref 26.0–34.0)
MCHC: 33.7 g/dL (ref 30.0–36.0)
MCV: 87.1 fL (ref 78.0–100.0)
Monocytes Absolute: 0.7 10*3/uL (ref 0.1–1.0)
Monocytes Relative: 10 %
NEUTROS PCT: 57 %
Neutro Abs: 4.1 10*3/uL (ref 1.7–7.7)
Platelets: 254 10*3/uL (ref 150–400)
RBC: 4.02 MIL/uL — AB (ref 4.22–5.81)
RDW: 15.2 % (ref 11.5–15.5)
WBC: 7.1 10*3/uL (ref 4.0–10.5)

## 2016-06-02 LAB — COMPREHENSIVE METABOLIC PANEL
ALBUMIN: 3.8 g/dL (ref 3.5–5.0)
ALT: 17 U/L (ref 17–63)
AST: 17 U/L (ref 15–41)
Alkaline Phosphatase: 70 U/L (ref 38–126)
Anion gap: 6 (ref 5–15)
BUN: 19 mg/dL (ref 6–20)
CO2: 27 mmol/L (ref 22–32)
CREATININE: 0.98 mg/dL (ref 0.61–1.24)
Calcium: 8.8 mg/dL — ABNORMAL LOW (ref 8.9–10.3)
Chloride: 104 mmol/L (ref 101–111)
GFR calc non Af Amer: >60 mL/min (ref >60)
GLUCOSE: 119 mg/dL — AB (ref 65–99)
Potassium: 4 mmol/L (ref 3.5–5.1)
SODIUM: 137 mmol/L (ref 135–145)
Total Bilirubin: 0.4 mg/dL (ref 0.3–1.2)
Total Protein: 7.1 g/dL (ref 6.5–8.1)

## 2016-06-02 MED ORDER — SODIUM CHLORIDE 0.9 % IV SOLN
20.0000 mg | Freq: Once | INTRAVENOUS | Status: AC
  Administered 2016-06-02: 20 mg via INTRAVENOUS
  Filled 2016-06-02: qty 2

## 2016-06-02 MED ORDER — DIPHENHYDRAMINE HCL 50 MG/ML IJ SOLN
50.0000 mg | Freq: Once | INTRAMUSCULAR | Status: AC
  Administered 2016-06-02: 50 mg via INTRAVENOUS
  Filled 2016-06-02: qty 1

## 2016-06-02 MED ORDER — SODIUM CHLORIDE 0.9 % IV SOLN
Freq: Once | INTRAVENOUS | Status: AC
  Administered 2016-06-02: 11:00:00 via INTRAVENOUS

## 2016-06-02 MED ORDER — SODIUM CHLORIDE 0.9 % IV SOLN
203.4000 mg | Freq: Once | INTRAVENOUS | Status: AC
  Administered 2016-06-02: 200 mg via INTRAVENOUS
  Filled 2016-06-02: qty 20

## 2016-06-02 MED ORDER — PACLITAXEL CHEMO INJECTION 300 MG/50ML
50.0000 mg/m2 | Freq: Once | INTRAVENOUS | Status: AC
  Administered 2016-06-02: 90 mg via INTRAVENOUS
  Filled 2016-06-02: qty 15

## 2016-06-02 MED ORDER — FAMOTIDINE IN NACL 20-0.9 MG/50ML-% IV SOLN
20.0000 mg | Freq: Once | INTRAVENOUS | Status: AC
  Administered 2016-06-02: 20 mg via INTRAVENOUS
  Filled 2016-06-02: qty 50

## 2016-06-02 MED ORDER — SODIUM CHLORIDE 0.9% FLUSH: 10.0000 mL | INTRAVENOUS | Status: DC | PRN

## 2016-06-02 MED ORDER — HEPARIN SOD (PORK) LOCK FLUSH 100 UNIT/ML IV SOLN
500.0000 [IU] | Freq: Once | INTRAVENOUS | Status: AC | PRN
  Administered 2016-06-02: 500 [IU]
  Filled 2016-06-02: qty 5

## 2016-06-02 MED ORDER — PALONOSETRON HCL INJECTION 0.25 MG/5ML
0.2500 mg | Freq: Once | INTRAVENOUS | Status: AC
Start: 2016-06-02 — End: 2016-06-02
  Administered 2016-06-02: 0.25 mg via INTRAVENOUS
  Filled 2016-06-02: qty 5

## 2016-06-02 NOTE — Progress Notes (Signed)
Chemotherapy teaching on carboplatin and taxol.  Chemotherapy consent signed.  Extensive teaching packet given.

## 2016-06-02 NOTE — Progress Notes (Signed)
Tolerated tx w/o adverse reaction.  Alert, in no distress.  VSS.  Discharged ambulatory. 

## 2016-06-02 NOTE — Patient Instructions (Signed)
[facility] Discharge Instructions for Patients Receiving Chemotherapy   Beginning January 23rd 2017 lab work for the Cancer Center will be done in the  Main lab at [facility] on 1st floor. If you have a lab appointment with the Cancer Center please come in thru the  Main Entrance and check in at the main information desk   Today you received the following chemotherapy agents:  Taxol and carboplatin  If you develop nausea and vomiting, or diarrhea that is not controlled by your medication, call the clinic.  The clinic phone number is [phone]. Office hours are Monday-Friday 8:30am-5:00pm.  BELOW ARE SYMPTOMS THAT SHOULD BE REPORTED IMMEDIATELY:  *FEVER GREATER THAN 101.0 F  *CHILLS WITH OR WITHOUT FEVER  NAUSEA AND VOMITING THAT IS NOT CONTROLLED WITH YOUR NAUSEA MEDICATION  *UNUSUAL SHORTNESS OF BREATH  *UNUSUAL BRUISING OR BLEEDING  TENDERNESS IN MOUTH AND THROAT WITH OR WITHOUT PRESENCE OF ULCERS  *URINARY PROBLEMS  *BOWEL PROBLEMS  UNUSUAL RASH Items with * indicate a potential emergency and should be followed up as soon as possible. If you have an emergency after office hours please contact your primary care physician or go to the nearest emergency department.  Please call the clinic during office hours if you have any questions or concerns.   You may also contact the Patient Navigator at [phone] should you have any questions or need assistance in obtaining follow up care.      Resources For Cancer Patients and their Caregivers ? American Cancer Society: Can assist with transportation, wigs, general needs, runs Look Good Feel Better.        1-888-227-6333 ? Cancer Care: Provides financial assistance, online support groups, medication/co-pay assistance.  1-800-813-HOPE (4673) ? [facility] Assists [place] cancer patients and their families through emotional , educational and financial support.   336-427-4357 ? [place] DSS Where to apply for food stamps, Medicaid and utility assistance. 336-342-1394 ? [facility]: Transportation to medical appointments. 336-347-2287 ? Social Security Administration: May apply for disability if have a Stage IV cancer. 336-342-7796 1-800-772-1213 ? [place] Aging, Disability and Transit Services: Assists with nutrition, care and transit needs. 336-349-2343         

## 2016-06-04 NOTE — Progress Notes (Signed)
24 hr follow up- patient is doing well, no issues.

## 2016-06-09 ENCOUNTER — Ambulatory Visit (HOSPITAL_COMMUNITY): Payer: Medicaid Other | Admitting: Oncology

## 2016-06-09 ENCOUNTER — Ambulatory Visit (HOSPITAL_COMMUNITY): Payer: Medicaid Other

## 2016-06-11 ENCOUNTER — Ambulatory Visit (HOSPITAL_COMMUNITY): Payer: Medicaid Other

## 2016-06-11 ENCOUNTER — Ambulatory Visit (HOSPITAL_COMMUNITY): Payer: Medicaid Other | Admitting: Oncology

## 2016-06-14 ENCOUNTER — Ambulatory Visit (HOSPITAL_COMMUNITY): Payer: Medicaid Other

## 2016-06-15 ENCOUNTER — Encounter: Payer: Self-pay | Admitting: Family Medicine

## 2016-06-15 ENCOUNTER — Ambulatory Visit (INDEPENDENT_AMBULATORY_CARE_PROVIDER_SITE_OTHER): Payer: Medicaid Other | Admitting: Family Medicine

## 2016-06-15 VITALS — BP 118/80 | HR 84 | Resp 16 | Ht 70.0 in | Wt 152.0 lb

## 2016-06-15 DIAGNOSIS — L404 Guttate psoriasis: Secondary | ICD-10-CM | POA: Diagnosis not present

## 2016-06-15 DIAGNOSIS — F17200 Nicotine dependence, unspecified, uncomplicated: Secondary | ICD-10-CM

## 2016-06-15 DIAGNOSIS — F17210 Nicotine dependence, cigarettes, uncomplicated: Secondary | ICD-10-CM

## 2016-06-15 DIAGNOSIS — I10 Essential (primary) hypertension: Secondary | ICD-10-CM | POA: Diagnosis not present

## 2016-06-15 DIAGNOSIS — C159 Malignant neoplasm of esophagus, unspecified: Secondary | ICD-10-CM

## 2016-06-15 DIAGNOSIS — C61 Malignant neoplasm of prostate: Secondary | ICD-10-CM

## 2016-06-15 MED ORDER — AMLODIPINE BESYLATE 10 MG PO TABS: ORAL_TABLET | ORAL | 6 refills | Status: DC

## 2016-06-15 NOTE — Patient Instructions (Addendum)
F/u in 4. 5 months, call if yoou need me before, pls ensure new tele on record per pt  Best with treatment , aim is cure  NO MORE SMOKING keep working at this   Fasting lipid, chem 7,TSH and chem 7 in 4. 5 month  Will follow up with cancer center if OK to get tetanus shot

## 2016-06-16 ENCOUNTER — Encounter (HOSPITAL_BASED_OUTPATIENT_CLINIC_OR_DEPARTMENT_OTHER): Payer: Medicaid Other

## 2016-06-16 ENCOUNTER — Encounter (HOSPITAL_BASED_OUTPATIENT_CLINIC_OR_DEPARTMENT_OTHER): Payer: Medicaid Other | Admitting: Oncology

## 2016-06-16 ENCOUNTER — Encounter (HOSPITAL_COMMUNITY): Payer: Self-pay | Admitting: Oncology

## 2016-06-16 VITALS — BP 123/68 | HR 71 | Temp 97.5°F | Resp 16 | Wt 151.8 lb

## 2016-06-16 DIAGNOSIS — C159 Malignant neoplasm of esophagus, unspecified: Secondary | ICD-10-CM

## 2016-06-16 DIAGNOSIS — Z5111 Encounter for antineoplastic chemotherapy: Secondary | ICD-10-CM

## 2016-06-16 LAB — CBC WITH DIFFERENTIAL/PLATELET
Basophils Absolute: 0 10*3/uL (ref 0.0–0.1)
Basophils Relative: 1 %
EOS PCT: 2 %
Eosinophils Absolute: 0.1 10*3/uL (ref 0.0–0.7)
HEMATOCRIT: 33.8 % — AB (ref 39.0–52.0)
Hemoglobin: 11.4 g/dL — ABNORMAL LOW (ref 13.0–17.0)
LYMPHS PCT: 18 %
Lymphs Abs: 0.9 10*3/uL (ref 0.7–4.0)
MCH: 29.3 pg (ref 26.0–34.0)
MCHC: 33.7 g/dL (ref 30.0–36.0)
MCV: 86.9 fL (ref 78.0–100.0)
MONO ABS: 0.7 10*3/uL (ref 0.1–1.0)
MONOS PCT: 15 %
NEUTROS ABS: 3.3 10*3/uL (ref 1.7–7.7)
Neutrophils Relative %: 64 %
PLATELETS: 171 10*3/uL (ref 150–400)
RBC: 3.89 MIL/uL — ABNORMAL LOW (ref 4.22–5.81)
RDW: 15.5 % (ref 11.5–15.5)
WBC: 5.1 10*3/uL (ref 4.0–10.5)

## 2016-06-16 LAB — COMPREHENSIVE METABOLIC PANEL
ALT: 23 U/L (ref 17–63)
ANION GAP: 6 (ref 5–15)
AST: 18 U/L (ref 15–41)
Albumin: 3.9 g/dL (ref 3.5–5.0)
Alkaline Phosphatase: 69 U/L (ref 38–126)
BILIRUBIN TOTAL: 0.6 mg/dL (ref 0.3–1.2)
BUN: 20 mg/dL (ref 6–20)
CO2: 26 mmol/L (ref 22–32)
Calcium: 8.6 mg/dL — ABNORMAL LOW (ref 8.9–10.3)
Chloride: 102 mmol/L (ref 101–111)
Creatinine, Ser: 1.06 mg/dL (ref 0.61–1.24)
Glucose, Bld: 150 mg/dL — ABNORMAL HIGH (ref 65–99)
POTASSIUM: 4 mmol/L (ref 3.5–5.1)
Sodium: 134 mmol/L — ABNORMAL LOW (ref 135–145)
TOTAL PROTEIN: 7.1 g/dL (ref 6.5–8.1)

## 2016-06-16 MED ORDER — PALONOSETRON HCL INJECTION 0.25 MG/5ML
INTRAVENOUS | Status: AC
  Filled 2016-06-16: qty 5

## 2016-06-16 MED ORDER — SODIUM CHLORIDE 0.9 % IV SOLN
Freq: Once | INTRAVENOUS | Status: AC
  Administered 2016-06-16: 10:00:00 via INTRAVENOUS

## 2016-06-16 MED ORDER — PACLITAXEL CHEMO INJECTION 300 MG/50ML
50.0000 mg/m2 | Freq: Once | INTRAVENOUS | Status: AC
  Administered 2016-06-16: 90 mg via INTRAVENOUS
  Filled 2016-06-16: qty 15

## 2016-06-16 MED ORDER — HEPARIN SOD (PORK) LOCK FLUSH 100 UNIT/ML IV SOLN
500.0000 [IU] | Freq: Once | INTRAVENOUS | Status: AC | PRN
  Administered 2016-06-16: 500 [IU]
  Filled 2016-06-16 (×2): qty 5

## 2016-06-16 MED ORDER — SODIUM CHLORIDE 0.9 % IV SOLN
191.8000 mg | Freq: Once | INTRAVENOUS | Status: AC
  Administered 2016-06-16: 190 mg via INTRAVENOUS
  Filled 2016-06-16: qty 19

## 2016-06-16 MED ORDER — SODIUM CHLORIDE 0.9 % IV SOLN
20.0000 mg | Freq: Once | INTRAVENOUS | Status: AC
  Administered 2016-06-16: 20 mg via INTRAVENOUS
  Filled 2016-06-16: qty 2

## 2016-06-16 MED ORDER — DIPHENHYDRAMINE HCL 50 MG/ML IJ SOLN
50.0000 mg | Freq: Once | INTRAMUSCULAR | Status: AC
  Administered 2016-06-16: 50 mg via INTRAVENOUS
  Filled 2016-06-16: qty 1

## 2016-06-16 MED ORDER — FAMOTIDINE IN NACL 20-0.9 MG/50ML-% IV SOLN
20.0000 mg | Freq: Once | INTRAVENOUS | Status: AC
  Administered 2016-06-16: 20 mg via INTRAVENOUS
  Filled 2016-06-16: qty 50

## 2016-06-16 MED ORDER — SODIUM CHLORIDE 0.9% FLUSH
10.0000 mL | INTRAVENOUS | Status: DC | PRN
  Administered 2016-06-16: 10 mL
  Filled 2016-06-16: qty 10

## 2016-06-16 MED ORDER — PALONOSETRON HCL INJECTION 0.25 MG/5ML
0.2500 mg | Freq: Once | INTRAVENOUS | Status: AC
  Administered 2016-06-16: 0.25 mg via INTRAVENOUS

## 2016-06-16 NOTE — Progress Notes (Signed)
Stanley Jimenez tolerated chemo tx well without complaints or incident. Labs reviewed prior to administering chemotherapy. VSS upon discharge. Pt discharged self ambulatory in satisfactory condition transported by [facility]

## 2016-06-16 NOTE — Patient Instructions (Addendum)
[place] at [facility] Discharge Instructions  RECOMMENDATIONS MADE BY THE CONSULTANT AND ANY TEST RESULTS WILL BE SENT TO YOUR REFERRING PHYSICIAN.  You were seen today by Kirby Crigler PA-C. Treatment today. Start IV fluids on Monday, Friday and Wednesdays with treatments. Return weekly with treatment. Return as scheduled for follow up.   Thank you for choosing [place] at [facility] to provide your oncology and hematology care.  To afford each patient quality time with our provider, please arrive at least 15 minutes before your scheduled appointment time.    If you have a lab appointment with the [facility] please come in thru the  Main Entrance and check in at the main information desk  You need to re-schedule your appointment should you arrive 10 or more minutes late.  We strive to give you quality time with our providers, and arriving late affects you and other patients whose appointments are after yours.  Also, if you no show three or more times for appointments you may be dismissed from the clinic at the providers discretion.     Again, thank you for choosing [facility].  Our hope is that these requests will decrease the amount of time that you wait before being seen by our physicians.       _____________________________________________________________  Should you have questions after your visit to [facility], please contact our office at [phone] between the hours of 8:30 a.m. and 4:30 p.m.  Voicemails left after 4:30 p.m. will not be returned until the following business day.  For prescription refill requests, have your pharmacy contact our office.       Resources For Cancer Patients and their Caregivers ? American Cancer Society: Can assist with transportation, wigs, general needs, runs Look Good Feel Better.        903-777-4969 ? Cancer Care: Provides financial assistance, online  support groups, medication/co-pay assistance.  1-800-813-HOPE (848)480-7541) ? [facility] Assists [place] cancer patients and their families through emotional , educational and financial support.  406-738-7360 ? [place] DSS Where to apply for food stamps, Medicaid and utility assistance. 346-791-0777 ? [facility]: Transportation to medical appointments. 4704382341 ? Social Security Administration: May apply for disability if have a Stage IV cancer. 417 422 7894 (256) 762-0327 ? [employer], Disability and Transit Services: Assists with nutrition, care and transit needs. [place] Support Programs: @10RELATIVEDAYS @ > Cancer Support Group  2nd Tuesday of the month 1pm-2pm, Journey Room  > Creative Journey  3rd Tuesday of the month 1130am-1pm, Journey Room  > Look Good Feel Better  1st Wednesday of the month 10am-12 noon, Journey Room (Call [facility] to register [phone])

## 2016-06-16 NOTE — Patient Instructions (Signed)
[facility] Discharge Instructions for Patients Receiving Chemotherapy   Beginning January 23rd 2017 lab work for the Cancer Center will be done in the  Main lab at [facility] on 1st floor. If you have a lab appointment with the Cancer Center please come in thru the  Main Entrance and check in at the main information desk   Today you received the following chemotherapy agents Taxol and Carboplatin. Follow-up as scheduled. Call clinic for any questions or concerns  To help prevent nausea and vomiting after your treatment, we encourage you to take your nausea medication.   If you develop nausea and vomiting, or diarrhea that is not controlled by your medication, call the clinic.  The clinic phone number is [phone]. Office hours are Monday-Friday 8:30am-5:00pm.  BELOW ARE SYMPTOMS THAT SHOULD BE REPORTED IMMEDIATELY:  *FEVER GREATER THAN 101.0 F  *CHILLS WITH OR WITHOUT FEVER  NAUSEA AND VOMITING THAT IS NOT CONTROLLED WITH YOUR NAUSEA MEDICATION  *UNUSUAL SHORTNESS OF BREATH  *UNUSUAL BRUISING OR BLEEDING  TENDERNESS IN MOUTH AND THROAT WITH OR WITHOUT PRESENCE OF ULCERS  *URINARY PROBLEMS  *BOWEL PROBLEMS  UNUSUAL RASH Items with * indicate a potential emergency and should be followed up as soon as possible. If you have an emergency after office hours please contact your primary care physician or go to the nearest emergency department.  Please call the clinic during office hours if you have any questions or concerns.   You may also contact the Patient Navigator at [phone] should you have any questions or need assistance in obtaining follow up care.      Resources For Cancer Patients and their Caregivers ? American Cancer Society: Can assist with transportation, wigs, general needs, runs Look Good Feel Better.        1-888-227-6333 ? Cancer Care: Provides financial assistance, online support groups, medication/co-pay assistance.   1-800-813-HOPE (4673) ? [facility] Assists [place] cancer patients and their families through emotional , educational and financial support.  336-427-4357 ? [place] DSS Where to apply for food stamps, Medicaid and utility assistance. 336-342-1394 ? [facility]: Transportation to medical appointments. 336-347-2287 ? Social Security Administration: May apply for disability if have a Stage IV cancer. 336-342-7796 1-800-772-1213 ? [place] Aging, Disability and Transit Services: Assists with nutrition, care and transit needs. 336-349-2343         

## 2016-06-16 NOTE — Assessment & Plan Note (Addendum)
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  EUS felt not to be beneficial.  Started concomitant chemoXRT on 06/02/2016 with Carboplatin/Paclitaxel weekly.  Oncology history updated.  Staging in [facility] problem list completed.  Pre-treatment labs today: CBC diff, CMET.  I personally reviewed and went over laboratory results with the patient.  The results are noted within this dictation.  Labs satisfy treatment parameters today.  Start IVF Mon-Wed-Fri.  Yesterday, 06/15/2016, I sent a message to Dr. Servando Snare requesting he review the patient's case and evaluate whether consultation with him (CTS) for consideration of resection is recommended.  I will work with Dr. Servando Snare regarding timing of this referral.  Of note, EUS was discussed with Dr. Ardis Hughs and felt not to be helpful with regards to staging due to positive upper gastrohepatic ligament lymph node on PET imaging.  Return next week for day 15 of treatment.  Return in 2 weeks for day 22 of treatment and follow-up.  Addendum: Dr. Servando Snare would like to see the patient 1/2 through XRT.  We will arrange referral accordingly.

## 2016-06-16 NOTE — Progress Notes (Addendum)
[name], MD [address] [place] [address]  Squamous cell carcinoma of esophagus [facility])  CURRENT THERAPY: Concomitant chemoXRT with Carboplatin/Paclitaxel weekly beginning on 06/02/2016  INTERVAL HISTORY: Stanley Jimenez 61 y.o. 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  EUS felt not to be beneficial.    Squamous cell carcinoma of esophagus (HCC)   04/14/2016 Procedure    EGD by Dr. Laural Golden.      04/16/2016 Pathology Results    Esophagus, biopsy - INVASIVE POORLY DIFFERENTIATED SQUAMOUS CELL CARCINOMA.      04/26/2016 Imaging    CT CAP- 1. Prominent gastroesophageal junction without discrete esophageal mass. Notably, there is also a borderline enlarged enhancing lymph node in the gastrohepatic ligament immediately adjacent to the gastroesophageal junction, which is nonspecific, but could indicate early nodal disease. 2. The small cluster of low-attenuation lesions in segment 7 of the liver are too small to characterize. Statistically, these are likely to represent cysts. If there is clinical concern for metastatic disease to the liver, these could be further characterized with MRI of the abdomen with and without IV gadolinium at this time. 3. No other potential sites of metastatic disease noted elsewhere in the chest, abdomen or pelvis.      05/10/2016 PET scan    Hypermetabolic bilateral lower thoracic esophageal mass, consistent with known primary esophageal carcinoma.  Small hypermetabolic lymph node in the upper gastrohepatic ligament adjacent to GE junction, consistent with lymph node metastasis.  No other sites of metastatic disease identified.      05/31/2016 Procedure    Port placed by Dr. Rosana Hoes      06/02/2016 -  Chemotherapy    The patient had palonosetron (ALOXI) injection 0.25 mg, 0.25 mg, Intravenous,  Once, 1 of 1 cycle  CARBOplatin  (PARAPLATIN) 200 mg in sodium chloride 0.9 % 250 mL chemo infusion, 200 mg (100 % of original dose 203.4 mg), Intravenous,  Once, 1 of 1 cycle Dose modification:   (original dose 203.4 mg, Cycle 1)  PACLitaxel (TAXOL) 90 mg in dextrose 5 % 250 mL chemo infusion ( for chemotherapy treatment.        06/02/2016 -  Radiation Therapy          He is doing well and tolerating treatment without any difficulty at this time.  He denies any nausea or vomiting.  He denies any dysphagia or difficulty with consuming liquids.  Weight is stable.  He denies any complaints today.  He is tolerating radiation well at this time.  He denies any chest pain or discomfort.  Review of Systems  Constitutional: Negative.  Negative for chills, fever and weight loss.  HENT: Negative.   Eyes: Negative.   Respiratory: Negative.  Negative for cough.   Cardiovascular: Negative.  Negative for chest pain.  Gastrointestinal: Negative.  Negative for constipation, diarrhea, nausea and vomiting.  Genitourinary: Negative.   Musculoskeletal: Negative.   Skin: Negative.   Neurological: Negative.  Negative for weakness.  Endo/Heme/Allergies: Negative.   Psychiatric/Behavioral: Negative.     Past Medical History:  Diagnosis Date  . 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 Anxiety   . Arthritis   . Cirrhosis, alcoholic ([place])   . Clubbing of fingers    congenital  . Depression   . Full dentures   . History of PSVT (paroxysmal supraventricular tachycardia)    run  of non-sustatined VT 03-10-2015 in setting of alcohol withdrawal in hospital  . History of seizure    03-08-2012  alcohol withdrawal  . History of thrombocytopenia    10/ 2013  in setting of alcohol withdrawal  . Hyperlipidemia   . Hypertension   . Prostate cancer [facility]) urologist-  dr dalhstedt/  oncologist- dr Tammi Klippel   T1c, Gleason 3+3,  PSA 8.89,  vol 27cc  . 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 Squamous cell carcinoma of esophagus ([place]) 04/21/2016    Past Surgical History:  Procedure Laterality Date  . BIOPSY  04/14/2016   Procedure: BIOPSY;  Surgeon: Rogene Houston, MD;  [facility];  Service: Endoscopy;;  esophagus  . COLONOSCOPY N/A 04/14/2016   Procedure: COLONOSCOPY;  Surgeon: Rogene Houston, MD;  [facility];  Service: Endoscopy;  Laterality: N/A;  . ESOPHAGOGASTRODUODENOSCOPY N/A 04/14/2016   Procedure: ESOPHAGOGASTRODUODENOSCOPY (EGD);  Surgeon: Rogene Houston, MD;  Location[facility];  Service: Endoscopy;  Laterality: N/A;  1:00  . NO PAST SURGERIES    . PORTACATH PLACEMENT Right 05/31/2016   Procedure: INSERTION OF TUNNELED RIGHT INTERNAL JUGULAR BARD POWERPORT CENTRAL VENOUS CATHETER WITH SUBCUTANEOUS PORT;  Surgeon: Vickie Epley, MD;  Location: [facility];  Service: Vascular;  Laterality: Right;  . RADIOACTIVE SEED IMPLANT N/A 09/20/2014   Procedure: RADIOACTIVE SEED IMPLANT;  Surgeon: Franchot Gallo, MD;  Location: [facility];  Service: Urology;  Laterality: N/A;    77   seeds implanted no seeds found in bladder  . TRANSURETHRAL RESECTION OF PROSTATE      Family History  Problem Relation Age of Onset  . Alcohol abuse Mother   . Diabetes Sister     Social History   Social History  . Marital status: Single    Spouse name: N/A  . Number of children: N/A  . Years of education: N/A   Social History Main Topics  . Smoking status: Current Every Day Smoker    Packs/day: 0.10    Years: 20.00    Types: Cigarettes  . Smokeless tobacco: Former Systems developer    Types: [place] date: 05/27/1984     Comment: smokes 2 cigarettes a day  . Alcohol use No     Comment: quit drinking in October 2015. He says before this he says he stayed drunk every day.   . Drug use: No     Comment: last use october 2015  . Sexual activity: No   Other Topics Concern  . None   Social History  Narrative  . None     PHYSICAL EXAMINATION  ECOG PERFORMANCE STATUS: 1 - Symptomatic but completely ambulatory  There were no vitals filed for this visit.  GENERAL:alert, no distress, well nourished, well developed, comfortable, cooperative, smiling and unaccompanied, in chemo-recliner. SKIN: skin color, texture, turgor are normal, no rashes or significant lesions HEAD: Normocephalic, No masses, lesions, tenderness or abnormalities EYES: normal, EOMI, Conjunctiva are pink and non-injected EARS: External ears normal OROPHARYNX:lips, buccal mucosa, and tongue normal and mucous membranes are moist  NECK: supple, no adenopathy, trachea midline LYMPH:  no palpable lymphadenopathy BREAST:not examined LUNGS: clear to auscultation  HEART: regular rate & rhythm, no murmurs and no gallops ABDOMEN:abdomen soft and normal bowel sounds BACK: Back symmetric, no curvature. EXTREMITIES:less then 2 second capillary refill, no joint deformities, effusion, or inflammation, no  skin discoloration, no cyanosis  NEURO: alert & oriented x 3 with fluent speech, no focal motor/sensory deficits, gait normal   LABORATORY DATA: CBC    Component Value Date/Time   WBC 5.1 06/16/2016 0903   RBC 3.89 (L) 06/16/2016 0903   HGB 11.4 (L) 06/16/2016 0903   HCT 33.8 (L) 06/16/2016 0903   PLT 171 06/16/2016 0903   MCV 86.9 06/16/2016 0903   MCH 29.3 06/16/2016 0903   MCHC 33.7 06/16/2016 0903   RDW 15.5 06/16/2016 0903   LYMPHSABS 0.9 06/16/2016 0903   MONOABS 0.7 06/16/2016 0903   EOSABS 0.1 06/16/2016 0903   BASOSABS 0.0 06/16/2016 0903      Chemistry      Component Value Date/Time   NA 134 (L) 06/16/2016 0903   K 4.0 06/16/2016 0903   CL 102 06/16/2016 0903   CO2 26 06/16/2016 0903   BUN 20 06/16/2016 0903   CREATININE 1.06 06/16/2016 0903   CREATININE 1.01 09/24/2015 0909      Component Value Date/Time   CALCIUM 8.6 (L) 06/16/2016 0903   ALKPHOS 69 06/16/2016 0903   AST 18 06/16/2016 0903    ALT 23 06/16/2016 0903   BILITOT 0.6 06/16/2016 0903        PENDING LABS:   RADIOGRAPHIC STUDIES:  [place] Intraoperative  Result Date: 05/31/2016 CLINICAL DATA:  Ultrasound was provided for use by the ordering physician, and a technical charge was applied by the performing facility.  No radiologist interpretation/professional services rendered.   Dg Chest Port 1 View  Result Date: 05/31/2016 CLINICAL DATA:  Post insertion of tunneled IJ catheter. EXAM: PORTABLE CHEST 1 VIEW COMPARISON:  None. FINDINGS: Grossly unchanged cardiac silhouette and mediastinal contours. Right jugular approach intravenous catheter tip projects over the superior cavoatrial junction. Note, the cranial mid aspect of the central line is excluded from view. No pneumothorax. No focal airspace opacities. No pleural effusion or pneumothorax. No evidence of edema. No acute osseus abnormalities. IMPRESSION: Post right internal jugular approach intravenous catheter with tip overlying the superior cavoatrial junction. No pneumothorax. Electronically Signed   By: Sandi Mariscal M.D.   On: 05/31/2016 09:03   Dg C-arm 1-60 Min-no Report  Result Date: 05/31/2016 There is no Radiologist interpretation  for this exam.    PATHOLOGY:    ASSESSMENT AND PLAN:  Squamous cell carcinoma of esophagus (HCC) Invasive squamous cell of esophagus (TXcN1cM0) with a PET positive upper gastrohepatic ligament.  EUS felt not to be beneficial.  Started concomitant chemoXRT on 06/02/2016 with Carboplatin/Paclitaxel weekly.  Oncology history updated.  Staging in [facility] problem list completed.  Pre-treatment labs today: CBC diff, CMET.  I personally reviewed and went over laboratory results with the patient.  The results are noted within this dictation.  Labs satisfy treatment parameters today.  Start IVF Mon-Wed-Fri.  Yesterday, 06/15/2016, I sent a message to Dr. Servando Snare requesting he review the patient's case and evaluate whether consultation with  him (CTS) for consideration of resection is recommended.  I will work with Dr. Servando Snare regarding timing of this referral.  Of note, EUS was discussed with Dr. Ardis Hughs and felt not to be helpful with regards to staging due to positive upper gastrohepatic ligament lymph node on PET imaging.  Return next week for day 15 of treatment.  Return in 2 weeks for day 22 of treatment and follow-up.  Addendum: Dr. Servando Snare would like to see the patient 1/2 through XRT.  We will arrange referral accordingly.   ORDERS PLACED FOR THIS  ENCOUNTER: No orders of the defined types were placed in this encounter.   MEDICATIONS PRESCRIBED THIS ENCOUNTER: Meds ordered this encounter  Medications  . diphenhydramine-acetaminophen (TYLENOL PM) 25-500 MG TABS tablet    Sig: Take 1 tablet by mouth at bedtime as needed.    THERAPY PLAN:  Continue with weekly Carboplatin/Paclitaxel with XRT.  All questions were answered. The patient knows to call the clinic with any problems, questions or concerns. We can certainly see the patient much sooner if necessary.  Patient and plan discussed with Dr. Ancil Linsey and she is in agreement with the aforementioned.   This note is electronically signed by: Doy Mince 06/16/2016 6:32 PM

## 2016-06-18 ENCOUNTER — Encounter (HOSPITAL_BASED_OUTPATIENT_CLINIC_OR_DEPARTMENT_OTHER): Payer: Medicaid Other

## 2016-06-18 VITALS — BP 117/65 | HR 79 | Temp 97.9°F | Resp 18

## 2016-06-18 DIAGNOSIS — C159 Malignant neoplasm of esophagus, unspecified: Secondary | ICD-10-CM

## 2016-06-18 DIAGNOSIS — C61 Malignant neoplasm of prostate: Secondary | ICD-10-CM

## 2016-06-18 MED ORDER — SODIUM CHLORIDE 0.9% FLUSH
10.0000 mL | INTRAVENOUS | Status: DC | PRN
  Administered 2016-06-18: 10 mL via INTRAVENOUS
  Filled 2016-06-18: qty 10

## 2016-06-18 MED ORDER — SODIUM CHLORIDE 0.9 % IV SOLN
INTRAVENOUS | Status: AC
  Administered 2016-06-18: 14:00:00 via INTRAVENOUS

## 2016-06-18 MED ORDER — HEPARIN SOD (PORK) LOCK FLUSH 100 UNIT/ML IV SOLN
500.0000 [IU] | Freq: Once | INTRAVENOUS | Status: AC
  Administered 2016-06-18: 500 [IU] via INTRAVENOUS

## 2016-06-18 NOTE — Progress Notes (Signed)
Stanley Jimenez tolerated hydration well without complaints or incident. VSS Pt discharged self ambulatory in satisfactory condition

## 2016-06-18 NOTE — Patient Instructions (Signed)
[place] at [facility] Discharge Instructions  RECOMMENDATIONS MADE BY THE CONSULTANT AND ANY TEST RESULTS WILL BE SENT TO YOUR REFERRING PHYSICIAN.  Received 2 hours hydration fluids today. Follow-up as scheduled. Call clinic for any questions or concerns  Thank you for choosing [place] at [facility] to provide your oncology and hematology care.  To afford each patient quality time with our provider, please arrive at least 15 minutes before your scheduled appointment time.    If you have a lab appointment with the [facility] please come in thru the  Main Entrance and check in at the main information desk  You need to re-schedule your appointment should you arrive 10 or more minutes late.  We strive to give you quality time with our providers, and arriving late affects you and other patients whose appointments are after yours.  Also, if you no show three or more times for appointments you may be dismissed from the clinic at the providers discretion.     Again, thank you for choosing [facility].  Our hope is that these requests will decrease the amount of time that you wait before being seen by our physicians.       _____________________________________________________________  Should you have questions after your visit to [facility], please contact our office at [phone] between the hours of 8:30 a.m. and 4:30 p.m.  Voicemails left after 4:30 p.m. will not be returned until the following business day.  For prescription refill requests, have your pharmacy contact our office.       Resources For Cancer Patients and their Caregivers ? American Cancer Society: Can assist with transportation, wigs, general needs, runs Look Good Feel Better.        970-356-0944 ? Cancer Care: Provides financial assistance, online support groups, medication/co-pay assistance.  1-800-813-HOPE 616-775-2478) ? [facility] Assists [place] cancer patients and their families through emotional , educational and financial support.  417-154-0872 ? [place] DSS Where to apply for food stamps, Medicaid and utility assistance. ([phone] 534-2078 ? RCATS: Transportation to medical appointments. 306-822-7606 ? Social Security Administration: May apply for disability if have a Stage IV cancer. [phone] 517-750-6003 ? [employer], Disability and Transit Services: Assists with nutrition, care and transit needs. [place] Support Programs: @10RELATIVEDAYS @ > Cancer Support Group  2nd Tuesday of the month 1pm-2pm, Journey Room  > Creative Journey  3rd Tuesday of the month 1130am-1pm, Journey Room  > Look Good Feel Better  1st Wednesday of the month 10am-12 noon, Journey Room (Call [place] to register [phone])

## 2016-06-20 ENCOUNTER — Encounter: Payer: Self-pay | Admitting: Family Medicine

## 2016-06-20 NOTE — Assessment & Plan Note (Signed)
Currently receiving treatment, with an aim of cure, denies adverse s/e

## 2016-06-20 NOTE — Assessment & Plan Note (Signed)
Controlled, no change in medication  

## 2016-06-20 NOTE — Assessment & Plan Note (Signed)
Controlled, no change in medication DASH diet and commitment to daily physical activity for a minimum of 30 minutes discussed and encouraged, as a part of hypertension management. The importance of attaining a healthy weight is also discussed.  BP/Weight 06/18/2016 06/16/2016 06/15/2016 06/02/2016 05/31/2016 05/27/2016 [id number]  Systolic BP 123XX123 [id number] [id number]  Diastolic BP 65 68 80 67 77 72 67  Wt. (Lbs) - 151.8 152 146.8 - 145.6 147.7  BMI - 21.78 21.81 21.06 - 20.89 20.89

## 2016-06-20 NOTE — Progress Notes (Signed)
   Stanley Jimenez     MRN: [id number]      DOB: 1955-07-06   HPI Stanley Jimenez is here for follow up and re-evaluation of chronic medical conditions, medication management and review of any available recent lab and radiology data.  Preventive health is updated, specifically  Immunization.   Questions or concerns regarding consultations or procedures which the PT has had in the interim are  Addressed.Since last seen he has been diagnosed with esophageal cancer, he denies pain or difficulty swallowing, he has started treatment ant is getting the necessary assistance needed to get to treatments. The PT denies any adverse reactions to current medications since the last visit.  ROS Denies recent fever or chills. Denies sinus pressure, nasal congestion, ear pain or sore throat. Denies chest congestion, productive cough or wheezing. Denies chest pains, palpitations and leg swelling Denies abdominal pain, nausea, vomiting,diarrhea or constipation.   Denies dysuria, frequency, hesitancy or incontinence. Denies joint pain, swelling and limitation in mobility. Denies headaches, seizures, numbness, or tingling. Denies depression, anxiety or insomnia. Denies skin break down or rash.   PE  BP 118/80   Pulse 84   Resp 16   Ht 5\' 10"  (1.778 m)   Wt 152 lb (68.9 kg)   SpO2 98%   BMI 21.81 kg/m   Patient alert and oriented and in no cardiopulmonary distress.  HEENT: No facial asymmetry, EOMI,   oropharynx pink and moist.  Neck supple no JVD, no mass.  Chest: Clear to auscultation bilaterally.  CVS: S1, S2 no murmurs, no S3.Regular rate.  ABD: Soft non tender.   Ext: No edema  MS: Adequate ROM spine, shoulders, hips and knees.  Skin: Intact, no ulcerations or rash noted.  Psych: Good eye contact, normal affect. Memory intact not anxious or depressed appearing.  CNS: CN 2-12 intact, power,  normal throughout.no focal deficits noted.   Assessment & Plan  Essential  hypertension Controlled, no change in medication DASH diet and commitment to daily physical activity for a minimum of 30 minutes discussed and encouraged, as a part of hypertension management. The importance of attaining a healthy weight is also discussed.  BP/Weight 06/18/2016 06/16/2016 06/15/2016 06/02/2016 05/31/2016 05/27/2016 [id number]  Systolic BP 123XX123 [id number] [id number] [id number] [id number] [id number]  Diastolic BP 65 68 80 67 77 72 67  Wt. (Lbs) - 151.8 152 146.8 - 145.6 147.7  BMI - 21.78 21.81 21.06 - 20.89 20.89       NICOTINE ADDICTION Patient counseled for approximately 5 minutes regarding the health risks of ongoing nicotine use, specifically all types of cancer, heart disease, stroke and respiratory failure. The options available for help with cessation ,the behavioral changes to assist the process, and the option to either gradully reduce usage  Or abruptly stop.is also discussed. Pt is also encouraged to set specific goals in number of cigarettes used daily, as well as to set a quit date.     Psoriasis, guttate Controlled, no change in medication   Squamous cell carcinoma of esophagus (HCC) Currently receiving treatment, with an aim of cure, denies adverse s/e  Malignant neoplasm of prostate Followed by urology

## 2016-06-20 NOTE — Assessment & Plan Note (Addendum)
Followed by urology.   

## 2016-06-20 NOTE — Assessment & Plan Note (Signed)

## 2016-06-21 ENCOUNTER — Encounter (HOSPITAL_BASED_OUTPATIENT_CLINIC_OR_DEPARTMENT_OTHER): Payer: Medicaid Other

## 2016-06-21 VITALS — BP 134/79 | HR 69 | Temp 98.1°F | Resp 18 | Wt 149.2 lb

## 2016-06-21 DIAGNOSIS — C159 Malignant neoplasm of esophagus, unspecified: Secondary | ICD-10-CM | POA: Diagnosis present

## 2016-06-21 MED ORDER — HEPARIN SOD (PORK) LOCK FLUSH 100 UNIT/ML IV SOLN
500.0000 [IU] | Freq: Once | INTRAVENOUS | Status: AC
  Administered 2016-06-21: 500 [IU] via INTRAVENOUS
  Filled 2016-06-21: qty 5

## 2016-06-21 MED ORDER — SODIUM CHLORIDE 0.9 % IV SOLN
INTRAVENOUS | Status: AC
  Administered 2016-06-21: 14:00:00 via INTRAVENOUS

## 2016-06-21 NOTE — Progress Notes (Signed)
Tolerated IV fluids well. Stable and ambulatory on discharge home to self. 

## 2016-06-21 NOTE — Patient Instructions (Signed)
[place] at [facility] Discharge Instructions  RECOMMENDATIONS MADE BY THE CONSULTANT AND ANY TEST RESULTS WILL BE SENT TO YOUR REFERRING PHYSICIAN.  IV fluids today as ordered. Return as scheduled.  Thank you for choosing [place] at [facility] to provide your oncology and hematology care.  To afford each patient quality time with our provider, please arrive at least 15 minutes before your scheduled appointment time.    If you have a lab appointment with the [facility] please come in thru the  Main Entrance and check in at the main information desk  You need to re-schedule your appointment should you arrive 10 or more minutes late.  We strive to give you quality time with our providers, and arriving late affects you and other patients whose appointments are after yours.  Also, if you no show three or more times for appointments you may be dismissed from the clinic at the providers discretion.     Again, thank you for choosing [facility].  Our hope is that these requests will decrease the amount of time that you wait before being seen by our physicians.       _____________________________________________________________  Should you have questions after your visit to [facility], please contact our office at [phone] between the hours of 8:30 a.m. and 4:30 p.m.  Voicemails left after 4:30 p.m. will not be returned until the following business day.  For prescription refill requests, have your pharmacy contact our office.       Resources For Cancer Patients and their Caregivers ? American Cancer Society: Can assist with transportation, wigs, general needs, runs Look Good Feel Better.        785-306-8232 ? Cancer Care: Provides financial assistance, online support groups, medication/co-pay assistance.  1-800-813-HOPE 902-557-7345) ? [place] [facility] Assists [place] cancer patients and  their families through emotional , educational and financial support.  650-776-8614 ? [place] DSS Where to apply for food stamps, Medicaid and utility assistance. 216-314-7571 ? RCATS: Transportation to medical appointments. 5033032407 ? Social Security Administration: May apply for disability if have a Stage IV cancer. [phone] ? [employer], Disability and Transit Services: Assists with nutrition, care and transit needs. [place] Support Programs: @10RELATIVEDAYS @ > Cancer Support Group  2nd Tuesday of the month 1pm-2pm, Journey Room  > Creative Journey  3rd Tuesday of the month 1130am-1pm, Journey Room  > Look Good Feel Better  1st Wednesday of the month 10am-12 noon, Journey Room (Call [place] to register [phone])

## 2016-06-23 ENCOUNTER — Encounter (HOSPITAL_COMMUNITY): Payer: Self-pay

## 2016-06-23 ENCOUNTER — Encounter (HOSPITAL_BASED_OUTPATIENT_CLINIC_OR_DEPARTMENT_OTHER): Payer: Medicaid Other

## 2016-06-23 VITALS — BP 139/76 | HR 86 | Temp 98.0°F | Resp 18 | Wt 147.2 lb

## 2016-06-23 DIAGNOSIS — Z5111 Encounter for antineoplastic chemotherapy: Secondary | ICD-10-CM

## 2016-06-23 DIAGNOSIS — C159 Malignant neoplasm of esophagus, unspecified: Secondary | ICD-10-CM

## 2016-06-23 LAB — CBC WITH DIFFERENTIAL/PLATELET
BASOS ABS: 0 10*3/uL (ref 0.0–0.1)
Basophils Relative: 1 %
EOS PCT: 2 %
Eosinophils Absolute: 0.1 10*3/uL (ref 0.0–0.7)
HCT: 34.3 % — ABNORMAL LOW (ref 39.0–52.0)
Hemoglobin: 12 g/dL — ABNORMAL LOW (ref 13.0–17.0)
LYMPHS PCT: 15 %
Lymphs Abs: 0.7 10*3/uL (ref 0.7–4.0)
MCH: 30.2 pg (ref 26.0–34.0)
MCHC: 35 g/dL (ref 30.0–36.0)
MCV: 86.4 fL (ref 78.0–100.0)
MONO ABS: 0.4 10*3/uL (ref 0.1–1.0)
Monocytes Relative: 9 %
Neutro Abs: 3.4 10*3/uL (ref 1.7–7.7)
Neutrophils Relative %: 73 %
PLATELETS: 152 10*3/uL (ref 150–400)
RBC: 3.97 MIL/uL — ABNORMAL LOW (ref 4.22–5.81)
RDW: 15 % (ref 11.5–15.5)
WBC: 4.6 10*3/uL (ref 4.0–10.5)

## 2016-06-23 LAB — COMPREHENSIVE METABOLIC PANEL
ALT: 20 U/L (ref 17–63)
ANION GAP: 6 (ref 5–15)
AST: 18 U/L (ref 15–41)
Albumin: 3.9 g/dL (ref 3.5–5.0)
Alkaline Phosphatase: 53 U/L (ref 38–126)
BUN: 15 mg/dL (ref 6–20)
CHLORIDE: 102 mmol/L (ref 101–111)
CO2: 27 mmol/L (ref 22–32)
Calcium: 8.9 mg/dL (ref 8.9–10.3)
Creatinine, Ser: 0.84 mg/dL (ref 0.61–1.24)
Glucose, Bld: 109 mg/dL — ABNORMAL HIGH (ref 65–99)
POTASSIUM: 3.5 mmol/L (ref 3.5–5.1)
Sodium: 135 mmol/L (ref 135–145)
Total Bilirubin: 0.7 mg/dL (ref 0.3–1.2)
Total Protein: 6.8 g/dL (ref 6.5–8.1)

## 2016-06-23 MED ORDER — SODIUM CHLORIDE 0.9 % IV SOLN
20.0000 mg | Freq: Once | INTRAVENOUS | Status: AC
  Administered 2016-06-23: 20 mg via INTRAVENOUS
  Filled 2016-06-23: qty 2

## 2016-06-23 MED ORDER — PALONOSETRON HCL INJECTION 0.25 MG/5ML
INTRAVENOUS | Status: AC
  Filled 2016-06-23: qty 5

## 2016-06-23 MED ORDER — PALONOSETRON HCL INJECTION 0.25 MG/5ML
0.2500 mg | Freq: Once | INTRAVENOUS | Status: AC
  Administered 2016-06-23: 0.25 mg via INTRAVENOUS

## 2016-06-23 MED ORDER — DIPHENHYDRAMINE HCL 50 MG/ML IJ SOLN
50.0000 mg | Freq: Once | INTRAMUSCULAR | Status: AC
  Administered 2016-06-23: 50 mg via INTRAVENOUS
  Filled 2016-06-23: qty 1

## 2016-06-23 MED ORDER — SODIUM CHLORIDE 0.9 % IV SOLN
Freq: Once | INTRAVENOUS | Status: AC
  Administered 2016-06-23: 10:00:00 via INTRAVENOUS

## 2016-06-23 MED ORDER — SODIUM CHLORIDE 0.9% FLUSH
10.0000 mL | INTRAVENOUS | Status: DC | PRN
  Administered 2016-06-23: 10 mL
  Filled 2016-06-23: qty 10

## 2016-06-23 MED ORDER — FAMOTIDINE IN NACL 20-0.9 MG/50ML-% IV SOLN
20.0000 mg | Freq: Once | INTRAVENOUS | Status: AC
  Administered 2016-06-23: 20 mg via INTRAVENOUS

## 2016-06-23 MED ORDER — HEPARIN SOD (PORK) LOCK FLUSH 100 UNIT/ML IV SOLN
500.0000 [IU] | Freq: Once | INTRAVENOUS | Status: AC | PRN
  Administered 2016-06-23: 500 [IU]

## 2016-06-23 MED ORDER — SODIUM CHLORIDE 0.9 % IV SOLN
229.0000 mg | Freq: Once | INTRAVENOUS | Status: AC
  Administered 2016-06-23: 230 mg via INTRAVENOUS
  Filled 2016-06-23: qty 23

## 2016-06-23 MED ORDER — PACLITAXEL CHEMO INJECTION 300 MG/50ML
50.0000 mg/m2 | Freq: Once | INTRAVENOUS | Status: AC
  Administered 2016-06-23: 90 mg via INTRAVENOUS
  Filled 2016-06-23: qty 15

## 2016-06-23 MED ORDER — FAMOTIDINE IN NACL 20-0.9 MG/50ML-% IV SOLN
INTRAVENOUS | Status: AC
  Filled 2016-06-23: qty 50

## 2016-06-23 NOTE — Progress Notes (Signed)
Chemotherapy given today per orders , patient tolerated it well without problems.Vitals stable and discharged home from clinic ambulatory.

## 2016-06-23 NOTE — Patient Instructions (Signed)
[facility] Discharge Instructions for Patients Receiving Chemotherapy   Beginning January 23rd 2017 lab work for the Cancer Center will be done in the  Main lab at [place] on 1st floor. If you have a lab appointment with the Cancer Center please come in thru the  Main Entrance and check in at the main information desk   Today you received the following chemotherapy agents   To help prevent nausea and vomiting after your treatment, we encourage you to take your nausea medication     If you develop nausea and vomiting, or diarrhea that is not controlled by your medication, call the clinic.  The clinic phone number is [phone]. Office hours are Monday-Friday 8:30am-5:00pm.  BELOW ARE SYMPTOMS THAT SHOULD BE REPORTED IMMEDIATELY:  *FEVER GREATER THAN 101.0 F  *CHILLS WITH OR WITHOUT FEVER  NAUSEA AND VOMITING THAT IS NOT CONTROLLED WITH YOUR NAUSEA MEDICATION  *UNUSUAL SHORTNESS OF BREATH  *UNUSUAL BRUISING OR BLEEDING  TENDERNESS IN MOUTH AND THROAT WITH OR WITHOUT PRESENCE OF ULCERS  *URINARY PROBLEMS  *BOWEL PROBLEMS  UNUSUAL RASH Items with * indicate a potential emergency and should be followed up as soon as possible. If you have an emergency after office hours please contact your primary care physician or go to the nearest emergency department.  Please call the clinic during office hours if you have any questions or concerns.   You may also contact the Patient Navigator at [phone] should you have any questions or need assistance in obtaining follow up care.      Resources For Cancer Patients and their Caregivers ? American Cancer Society: Can assist with transportation, wigs, general needs, runs Look Good Feel Better.        1-888-227-6333 ? Cancer Care: Provides financial assistance, online support groups, medication/co-pay assistance.  1-800-813-HOPE (4673) ? [facility] Assists [place] cancer  patients and their families through emotional , educational and financial support.  336-427-4357 ? [place] DSS Where to apply for food stamps, Medicaid and utility assistance. 336-342-1394 ? RCATS: Transportation to medical appointments. 336-347-2287 ? Social Security Administration: May apply for disability if have a Stage IV cancer. 336-342-7796 1-800-772-1213 ? [place] A[employer], Disability and Transit Services: Assists with nutrition, care and transit needs. 336-349-2343         

## 2016-06-23 NOTE — Progress Notes (Signed)
Tolerated infusion w/o adverse reaction.  Alert, in no distress.  VSS.  Discharged ambulatory.  

## 2016-06-25 ENCOUNTER — Encounter (HOSPITAL_COMMUNITY): Payer: Medicaid Other | Attending: Oncology

## 2016-06-25 VITALS — BP 105/76 | HR 98 | Temp 98.2°F | Resp 18

## 2016-06-25 DIAGNOSIS — C159 Malignant neoplasm of esophagus, unspecified: Secondary | ICD-10-CM | POA: Insufficient documentation

## 2016-06-25 MED ORDER — SODIUM CHLORIDE 0.9% FLUSH
10.0000 mL | INTRAVENOUS | Status: DC | PRN
  Administered 2016-06-25: 10 mL via INTRAVENOUS
  Filled 2016-06-25: qty 10

## 2016-06-25 MED ORDER — SODIUM CHLORIDE 0.9 % IV SOLN
INTRAVENOUS | Status: DC
  Administered 2016-06-25: 13:00:00 via INTRAVENOUS

## 2016-06-25 MED ORDER — HEPARIN SOD (PORK) LOCK FLUSH 100 UNIT/ML IV SOLN
500.0000 [IU] | Freq: Once | INTRAVENOUS | Status: AC
  Administered 2016-06-25: 500 [IU] via INTRAVENOUS
  Filled 2016-06-25: qty 5

## 2016-06-25 NOTE — Progress Notes (Signed)
Stanley Jimenez tolerated hydration well without complaints or incident. VSS upon discharge.Pt discharged self ambulatory in satisfactory condition

## 2016-06-25 NOTE — Patient Instructions (Signed)
[facility] at [facility] Discharge Instructions  RECOMMENDATIONS MADE BY THE CONSULTANT AND ANY TEST RESULTS WILL BE SENT TO YOUR REFERRING PHYSICIAN.  Received IV fluids today. Follow-up as scheduled. Call clinic for any questions or concerns  Thank you for choosing [facility] at [facility] to provide your oncology and hematology care.  To afford each patient quality time with our provider, please arrive at least 15 minutes before your scheduled appointment time.    If you have a lab appointment with the Cancer Center please come in thru the  Main Entrance and check in at the main information desk  You need to re-schedule your appointment should you arrive 10 or more minutes late.  We strive to give you quality time with our providers, and arriving late affects you and other patients whose appointments are after yours.  Also, if you no show three or more times for appointments you may be dismissed from the clinic at the providers discretion.     Again, thank you for choosing [facility].  Our hope is that these requests will decrease the amount of time that you wait before being seen by our physicians.       _____________________________________________________________  Should you have questions after your visit to [facility], please contact our office at [phone] between the hours of 8:30 a.m. and 4:30 p.m.  Voicemails left after 4:30 p.m. will not be returned until the following business day.  For prescription refill requests, have your pharmacy contact our office.       Resources For Cancer Patients and their Caregivers ? American Cancer Society: Can assist with transportation, wigs, general needs, runs Look Good Feel Better.        1-888-227-6333 ? Cancer Care: Provides financial assistance, online support groups, medication/co-pay assistance.  1-800-813-HOPE (4673) ? [facility] Assists [place] cancer patients and their families through emotional , educational and financial support.  336-427-4357 ? [place] DSS Where to apply for food stamps, Medicaid and utility assistance. 336-342-1394 ? RCATS: Transportation to medical appointments. 336-347-2287 ? Social Security Administration: May apply for disability if have a Stage IV cancer. 336-342-7796 1-800-772-1213 ? [place] Aging, Disability and Transit Services: Assists with nutrition, care and transit needs. 336-349-2343  Cancer Center Support Programs: @10RELATIVEDAYS@ > Cancer Support Group  2nd Tuesday of the month 1pm-2pm, Journey Room  > Creative Journey  3rd Tuesday of the month 1130am-1pm, Journey Room  > Look Good Feel Better  1st Wednesday of the month 10am-12 noon, Journey Room (Call American Cancer Society to register 1-800-395-5775)   

## 2016-06-28 ENCOUNTER — Encounter (HOSPITAL_BASED_OUTPATIENT_CLINIC_OR_DEPARTMENT_OTHER): Payer: Medicaid Other

## 2016-06-28 ENCOUNTER — Encounter (HOSPITAL_COMMUNITY): Payer: Self-pay

## 2016-06-28 DIAGNOSIS — C159 Malignant neoplasm of esophagus, unspecified: Secondary | ICD-10-CM | POA: Diagnosis present

## 2016-06-28 MED ORDER — SODIUM CHLORIDE 0.9 % IV SOLN
INTRAVENOUS | Status: DC
  Administered 2016-06-28: 13:00:00 via INTRAVENOUS

## 2016-06-28 MED ORDER — HEPARIN SOD (PORK) LOCK FLUSH 100 UNIT/ML IV SOLN
500.0000 [IU] | Freq: Once | INTRAVENOUS | Status: AC
  Administered 2016-06-28: 500 [IU] via INTRAVENOUS

## 2016-06-28 MED ORDER — HEPARIN SOD (PORK) LOCK FLUSH 100 UNIT/ML IV SOLN
INTRAVENOUS | Status: AC
  Filled 2016-06-28: qty 5

## 2016-06-28 NOTE — Progress Notes (Signed)
1 Liter of IVF given today per orders. Patient tolerated it well. Vitals stable, discharged from clinic ambulatory. Follow up as scheduled.

## 2016-06-28 NOTE — Patient Instructions (Signed)
[place] at [facility] Discharge Instructions  RECOMMENDATIONS MADE BY THE CONSULTANT AND ANY TEST RESULTS WILL BE SENT TO YOUR REFERRING PHYSICIAN.  1 liter of fluids given today Follow up as scheduled  Thank you for choosing [place] at [facility] to provide your oncology and hematology care.  To afford each patient quality time with our provider, please arrive at least 15 minutes before your scheduled appointment time.    If you have a lab appointment with the [place] please come in thru the  Main Entrance and check in at the main information desk  You need to re-schedule your appointment should you arrive 10 or more minutes late.  We strive to give you quality time with our providers, and arriving late affects you and other patients whose appointments are after yours.  Also, if you no show three or more times for appointments you may be dismissed from the clinic at the providers discretion.     Again, thank you for choosing [facility].  Our hope is that these requests will decrease the amount of time that you wait before being seen by our physicians.       _____________________________________________________________  Should you have questions after your visit to [facility], please contact our office at [phone] between the hours of 8:30 a.m. and 4:30 p.m.  Voicemails left after 4:30 p.m. will not be returned until the following business day.  For prescription refill requests, have your pharmacy contact our office.       Resources For Cancer Patients and their Caregivers ? American Cancer Society: Can assist with transportation, wigs, general needs, runs Look Good Feel Better.        9144457852 ? Cancer Care: Provides financial assistance, online support groups, medication/co-pay assistance.  1-800-813-HOPE 616-080-3132) ? [place] Assists [place] cancer patients  and their families through emotional , educational and financial support.  3363795635 ? [place] DSS Where to apply for food stamps, Medicaid and utility assistance. 507-870-0998 ? RCATS: Transportation to medical appointments. 930 001 3510 ? Social Security Administration: May apply for disability if have a Stage IV cancer. 4842320556 986-753-8181 ? [employer], Disability and Transit Services: Assists with nutrition, care and transit needs. [place] Support Programs: @10RELATIVEDAYS @ > Cancer Support Group  2nd Tuesday of the month 1pm-2pm, Journey Room  > Creative Journey  3rd Tuesday of the month 1130am-1pm, Journey Room  > Look Good Feel Better  1st Wednesday of the month 10am-12 noon, Journey Room (Call [place] to register [phone])

## 2016-06-30 ENCOUNTER — Encounter: Payer: Medicaid Other | Admitting: Cardiothoracic Surgery

## 2016-06-30 ENCOUNTER — Encounter (HOSPITAL_BASED_OUTPATIENT_CLINIC_OR_DEPARTMENT_OTHER): Payer: Medicaid Other | Admitting: Oncology

## 2016-06-30 ENCOUNTER — Encounter (HOSPITAL_COMMUNITY): Payer: Self-pay | Admitting: Oncology

## 2016-06-30 ENCOUNTER — Encounter (HOSPITAL_COMMUNITY): Payer: Self-pay

## 2016-06-30 ENCOUNTER — Encounter (HOSPITAL_BASED_OUTPATIENT_CLINIC_OR_DEPARTMENT_OTHER): Payer: Medicaid Other

## 2016-06-30 VITALS — BP 129/74 | HR 84 | Temp 98.3°F | Resp 18 | Wt 158.8 lb

## 2016-06-30 DIAGNOSIS — R079 Chest pain, unspecified: Secondary | ICD-10-CM

## 2016-06-30 DIAGNOSIS — Z5111 Encounter for antineoplastic chemotherapy: Secondary | ICD-10-CM | POA: Diagnosis present

## 2016-06-30 DIAGNOSIS — C159 Malignant neoplasm of esophagus, unspecified: Secondary | ICD-10-CM

## 2016-06-30 LAB — COMPREHENSIVE METABOLIC PANEL
ALBUMIN: 3.9 g/dL (ref 3.5–5.0)
ALK PHOS: 61 U/L (ref 38–126)
ALT: 19 U/L (ref 17–63)
AST: 18 U/L (ref 15–41)
Anion gap: 7 (ref 5–15)
BILIRUBIN TOTAL: 0.6 mg/dL (ref 0.3–1.2)
BUN: 17 mg/dL (ref 6–20)
CALCIUM: 9.1 mg/dL (ref 8.9–10.3)
CO2: 26 mmol/L (ref 22–32)
Chloride: 102 mmol/L (ref 101–111)
Creatinine, Ser: 0.79 mg/dL (ref 0.61–1.24)
GFR calc non Af Amer: >60 mL/min (ref >60)
GLUCOSE: 122 mg/dL — AB (ref 65–99)
Potassium: 3.7 mmol/L (ref 3.5–5.1)
Sodium: 135 mmol/L (ref 135–145)
TOTAL PROTEIN: 6.7 g/dL (ref 6.5–8.1)

## 2016-06-30 LAB — CBC WITH DIFFERENTIAL/PLATELET
BASOS ABS: 0 10*3/uL (ref 0.0–0.1)
BASOS PCT: 1 %
EOS PCT: 1 %
Eosinophils Absolute: 0.1 10*3/uL (ref 0.0–0.7)
HCT: 32.9 % — ABNORMAL LOW (ref 39.0–52.0)
Hemoglobin: 11.3 g/dL — ABNORMAL LOW (ref 13.0–17.0)
LYMPHS PCT: 11 %
Lymphs Abs: 0.5 10*3/uL — ABNORMAL LOW (ref 0.7–4.0)
MCH: 29.7 pg (ref 26.0–34.0)
MCHC: 34.3 g/dL (ref 30.0–36.0)
MCV: 86.6 fL (ref 78.0–100.0)
Monocytes Absolute: 0.6 10*3/uL (ref 0.1–1.0)
Monocytes Relative: 14 %
NEUTROS ABS: 3.2 10*3/uL (ref 1.7–7.7)
Neutrophils Relative %: 73 %
PLATELETS: 121 10*3/uL — AB (ref 150–400)
RBC: 3.8 MIL/uL — AB (ref 4.22–5.81)
RDW: 15.3 % (ref 11.5–15.5)
WBC: 4.4 10*3/uL (ref 4.0–10.5)

## 2016-06-30 MED ORDER — SODIUM CHLORIDE 0.9 % IV SOLN
20.0000 mg | Freq: Once | INTRAVENOUS | Status: AC
  Administered 2016-06-30: 20 mg via INTRAVENOUS
  Filled 2016-06-30: qty 2

## 2016-06-30 MED ORDER — SODIUM CHLORIDE 0.9% FLUSH: 10.0000 mL | INTRAVENOUS | Status: DC | PRN

## 2016-06-30 MED ORDER — DIPHENHYDRAMINE HCL 50 MG/ML IJ SOLN
50.0000 mg | Freq: Once | INTRAMUSCULAR | Status: AC
  Administered 2016-06-30: 50 mg via INTRAVENOUS

## 2016-06-30 MED ORDER — SODIUM CHLORIDE 0.9 % IV SOLN
238.0000 mg | Freq: Once | INTRAVENOUS | Status: AC
  Administered 2016-06-30: 240 mg via INTRAVENOUS
  Filled 2016-06-30: qty 24

## 2016-06-30 MED ORDER — HEPARIN SOD (PORK) LOCK FLUSH 100 UNIT/ML IV SOLN
500.0000 [IU] | Freq: Once | INTRAVENOUS | Status: AC | PRN
  Administered 2016-06-30: 500 [IU]

## 2016-06-30 MED ORDER — DIPHENHYDRAMINE HCL 50 MG/ML IJ SOLN
INTRAMUSCULAR | Status: AC
  Filled 2016-06-30: qty 1

## 2016-06-30 MED ORDER — FAMOTIDINE IN NACL 20-0.9 MG/50ML-% IV SOLN
20.0000 mg | Freq: Once | INTRAVENOUS | Status: AC
  Administered 2016-06-30: 20 mg via INTRAVENOUS

## 2016-06-30 MED ORDER — SODIUM CHLORIDE 0.9 % IV SOLN
Freq: Once | INTRAVENOUS | Status: AC
  Administered 2016-06-30: 10:00:00 via INTRAVENOUS

## 2016-06-30 MED ORDER — FAMOTIDINE IN NACL 20-0.9 MG/50ML-% IV SOLN
INTRAVENOUS | Status: AC
  Filled 2016-06-30: qty 50

## 2016-06-30 MED ORDER — PACLITAXEL CHEMO INJECTION 300 MG/50ML
50.0000 mg/m2 | Freq: Once | INTRAVENOUS | Status: AC
  Administered 2016-06-30: 90 mg via INTRAVENOUS
  Filled 2016-06-30: qty 15

## 2016-06-30 MED ORDER — HYDROCODONE-ACETAMINOPHEN 5-325 MG PO TABS: 0.5000 | ORAL_TABLET | Freq: Four times a day (QID) | ORAL | 0 refills | Status: DC | PRN

## 2016-06-30 MED ORDER — PALONOSETRON HCL INJECTION 0.25 MG/5ML
0.2500 mg | Freq: Once | INTRAVENOUS | Status: AC
  Administered 2016-06-30: 0.25 mg via INTRAVENOUS

## 2016-06-30 MED ORDER — PALONOSETRON HCL INJECTION 0.25 MG/5ML
INTRAVENOUS | Status: AC
  Filled 2016-06-30: qty 5

## 2016-06-30 NOTE — Progress Notes (Signed)
Stanley Nakayama, MD [address] [place] [address]  Squamous cell carcinoma of esophagus [facility])  CURRENT THERAPY: Concomitant chemoXRT with Carboplatin/Paclitaxel weekly beginning on 06/02/2016  INTERVAL HISTORY: Stanley Jimenez 61 y.o. 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  EUS felt not to be beneficial.    Malignant neoplasm of prostate ([place])   04/23/2014 Procedure    [place] and prostate biopsy      04/23/2014 Pathology Results    2/12 cores positive for adenocarcinoma.  Gleason 3+3 = 6 pattern.      08/14/2014 Initial Diagnosis    Malignant neoplasm of prostate [facility])      - 09/20/2014 Radiation Therapy    Brachytherapy with I-125       Squamous cell carcinoma of esophagus ([place])   04/14/2016 Procedure    EGD by Dr. Laural Golden.      04/16/2016 Pathology Results    Esophagus, biopsy - INVASIVE POORLY DIFFERENTIATED SQUAMOUS CELL CARCINOMA.      04/26/2016 Imaging    CT CAP- 1. Prominent gastroesophageal junction without discrete esophageal mass. Notably, there is also a borderline enlarged enhancing lymph node in the gastrohepatic ligament immediately adjacent to the gastroesophageal junction, which is nonspecific, but could indicate early nodal disease. 2. The small cluster of low-attenuation lesions in segment 7 of the liver are too small to characterize. Statistically, these are likely to represent cysts. If there is clinical concern for metastatic disease to the liver, these could be further characterized with MRI of the abdomen with and without IV gadolinium at this time. 3. No other potential sites of metastatic disease noted elsewhere in the chest, abdomen or pelvis.      05/10/2016 PET scan    Hypermetabolic bilateral lower thoracic esophageal mass, consistent with known primary esophageal carcinoma.  Small hypermetabolic lymph node in the upper  gastrohepatic ligament adjacent to GE junction, consistent with lymph node metastasis.  No other sites of metastatic disease identified.      05/31/2016 Procedure    Port placed by Dr. Rosana Hoes      06/02/2016 -  Chemotherapy    The patient had palonosetron (ALOXI) injection 0.25 mg, 0.25 mg, Intravenous,  Once, 1 of 1 cycle  CARBOplatin (PARAPLATIN) 200 mg in sodium chloride 0.9 % 250 mL chemo infusion, 200 mg (100 % of original dose 203.4 mg), Intravenous,  Once, 1 of 1 cycle Dose modification:   (original dose 203.4 mg, Cycle 1)  PACLitaxel (TAXOL) 90 mg in dextrose 5 % 250 mL chemo infusion ( for chemotherapy treatment.        06/02/2016 -  Radiation Therapy    Finishes on 07/14/2016       He is tolerating treatment well. He notes some minimal chest discomfort with swallowing. He notes that it increases with hard foods such as pizza and chicken wings. I provided education regarding radiation-induced esophagitis. I've asked him to favor softer foods such as applesauce, rice, soups, Jell-O, milk shakes, etc. He is also utilizing ensure/boost for food supplementation. I'll see if we have some samples today to provide him.  He has been compliant with IV fluids as scheduled. He notes that these have been making him feel better. We'll continue with this as planned. His renal function is improved compared on we started aggressive hydration.  He denies any nausea or vomiting. He notes a stable appetite. His weight is stable.  Review of Systems  Constitutional: Negative.  Negative for chills, fever and weight loss.  HENT: Negative.   Eyes: Negative.   Respiratory: Negative.  Negative for cough.   Cardiovascular: Positive for chest pain (With consumption of food).  Gastrointestinal: Negative.  Negative for constipation, diarrhea, nausea and vomiting.  Genitourinary: Negative.   Musculoskeletal: Negative.   Skin: Negative.   Neurological: Negative.  Negative for weakness.    Endo/Heme/Allergies: Negative.   Psychiatric/Behavioral: Negative.     Past Medical History:  Diagnosis Date  . 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 Anxiety   . Arthritis   . Cirrhosis, alcoholic ([place])   . Clubbing of fingers    congenital  . Depression   . Full dentures   . History of PSVT (paroxysmal supraventricular tachycardia)    run of non-sustatined VT 03-10-2015 in setting of alcohol withdrawal in hospital  . History of seizure    03-08-2012  alcohol withdrawal  . History of thrombocytopenia    10/ 2013  in setting of alcohol withdrawal  . Hyperlipidemia   . Hypertension   . Prostate cancer [facility]) urologist-  dr dalhstedt/  oncologist- dr Tammi Klippel   T1c, Gleason 3+3,  PSA 8.89,  vol 27cc  . 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 Squamous cell carcinoma of esophagus ([place]) 04/21/2016    Past Surgical History:  Procedure Laterality Date  . BIOPSY  04/14/2016   Procedure: BIOPSY;  Surgeon: Rogene Houston, MD;  [facility];  Service: Endoscopy;;  esophagus  . COLONOSCOPY N/A 04/14/2016   Procedure: COLONOSCOPY;  Surgeon: Rogene Houston, MD;  [facility];  Service: Endoscopy;  Laterality: N/A;  . ESOPHAGOGASTRODUODENOSCOPY N/A 04/14/2016   Procedure: ESOPHAGOGASTRODUODENOSCOPY (EGD);  Surgeon: Rogene Houston, MD;  Location[facility];  Service: Endoscopy;  Laterality: N/A;  1:00  . NO PAST SURGERIES    . PORTACATH PLACEMENT Right 05/31/2016   Procedure: INSERTION OF TUNNELED RIGHT INTERNAL JUGULAR BARD POWERPORT CENTRAL VENOUS CATHETER WITH SUBCUTANEOUS PORT;  Surgeon: Vickie Epley, MD;  Location: [facility];  Service: Vascular;  Laterality: Right;  . RADIOACTIVE SEED IMPLANT N/A 09/20/2014   Procedure: RADIOACTIVE SEED IMPLANT;  Surgeon: Franchot Gallo, MD;  [facility];  Service: Urology;  Laterality: N/A;    77   seeds  implanted no seeds found in bladder  . TRANSURETHRAL RESECTION OF PROSTATE      Family History  Problem Relation Age of Onset  . Alcohol abuse Mother   . Diabetes Sister     Social History   Social History  . Marital status: Single    Spouse name: N/A  . Number of children: N/A  . Years of education: N/A   Social History Main Topics  . Smoking status: Current Every Day Smoker    Packs/day: 0.10    Years: 20.00    Types: Cigarettes  . Smokeless tobacco: Former Systems developer    Types: [place] date: 05/27/1984     Comment: smokes 2 cigarettes a day  . Alcohol use No     Comment: quit drinking in October 2015. He says before this he says he stayed drunk every day.   . Drug use: No     Comment: last use october 2015  . Sexual activity: No   Other Topics Concern  . None  Social History Narrative  . None     PHYSICAL EXAMINATION  ECOG PERFORMANCE STATUS: 1 - Symptomatic but completely ambulatory  There were no vitals filed for this visit.  Vitals - 1 value per visit [id number]  SYSTOLIC [id number]  DIASTOLIC 73  Pulse 74  Temperature 98.5  Respirations 18  Weight (lb) 158.8    GENERAL:alert, no distress, well nourished, well developed, comfortable, cooperative, smiling and unaccompanied, in chemo-recliner. SKIN: skin color, texture, turgor are normal, no rashes or significant lesions HEAD: Normocephalic, No masses, lesions, tenderness or abnormalities EYES: normal, EOMI, Conjunctiva are pink and non-injected EARS: External ears normal OROPHARYNX:lips, buccal mucosa, and tongue normal and mucous membranes are moist  NECK: supple, no adenopathy, trachea midline LYMPH:  no palpable lymphadenopathy BREAST:not examined LUNGS: clear to auscultation  HEART: regular rate & rhythm, no murmurs and no gallops ABDOMEN:abdomen soft and normal bowel sounds BACK: Back symmetric, no curvature. EXTREMITIES:less then 2 second capillary refill, no joint deformities, effusion, or  inflammation, no skin discoloration, no cyanosis  NEURO: alert & oriented x 3 with fluent speech, no focal motor/sensory deficits, gait normal   LABORATORY DATA: CBC    Component Value Date/Time   WBC 4.4 06/30/2016 0852   RBC 3.80 (L) 06/30/2016 0852   HGB 11.3 (L) 06/30/2016 0852   HCT 32.9 (L) 06/30/2016 0852   PLT 121 (L) 06/30/2016 0852   MCV 86.6 06/30/2016 0852   MCH 29.7 06/30/2016 0852   MCHC 34.3 06/30/2016 0852   RDW 15.3 06/30/2016 0852   LYMPHSABS 0.5 (L) 06/30/2016 0852   MONOABS 0.6 06/30/2016 0852   EOSABS 0.1 06/30/2016 0852   BASOSABS 0.0 06/30/2016 0852      Chemistry      Component Value Date/Time   NA 135 06/30/2016 0852   K 3.7 06/30/2016 0852   CL 102 06/30/2016 0852   CO2 26 06/30/2016 0852   BUN 17 06/30/2016 0852   CREATININE 0.79 06/30/2016 0852   CREATININE 1.01 09/24/2015 0909      Component Value Date/Time   CALCIUM 9.1 06/30/2016 0852   ALKPHOS 61 06/30/2016 0852   AST 18 06/30/2016 0852   ALT 19 06/30/2016 0852   BILITOT 0.6 06/30/2016 0852        PENDING LABS:   RADIOGRAPHIC STUDIES:  No results found.   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  EUS felt not to be beneficial.  Started concomitant chemoXRT on 06/02/2016 with Carboplatin/Paclitaxel weekly.  Oncology history updated.  Pre-treatment labs today: CBC diff, CMET.  I personally reviewed and went over laboratory results with the patient.  The results are noted within this dictation.  Labs satisfy treatment parameters today.  He will continue with IV fluids on Monday-Wednesday-Fridays. He is tolerating this very well. His renal function has improved since starting this aggressive IV fluid therapy.  He reports chest pain with food consumption. The patient's provide some education regarding  radiation-induced esophagitis.  He is utilizing ensure/boost for food supplementation. His weight is very stable.  He has an appointment later today with Dr. Servando Snare for thoracic consultation. The patient reports that he has transportation duration established for this appointment but will be confirmed at 2:00 this afternoon. I will update Dr. Servando Snare accordingly.  Return next week for day 36 of treatment and follow-up.  He  finishes XRT on 07/14/2016.  Adding another cycle of chemotherapy would fall on 07/14/2016 and therefore I am not sure it would beneficial.   For his chest discomfort, I will give Hydrocodone RX.  This is printed.  Return as scheduled for follow-up and treatment.   ORDERS PLACED FOR THIS ENCOUNTER: No orders of the defined types were placed in this encounter.   MEDICATIONS PRESCRIBED THIS ENCOUNTER: No orders of the defined types were placed in this encounter.   THERAPY PLAN:  Continue with weekly Carboplatin/Paclitaxel with XRT.  All questions were answered. The patient knows to call the clinic with any problems, questions or concerns. We can certainly see the patient much sooner if necessary.  Patient and plan discussed with Dr. Ancil Linsey and she is in agreement with the aforementioned.   This note is electronically signed by: Robynn Pane, PA-C 06/30/2016 10:00 AM

## 2016-06-30 NOTE — Patient Instructions (Signed)
[facility] Discharge Instructions for Patients Receiving Chemotherapy   Beginning January 23rd 2017 lab work for the Cancer Center will be done in the  Main lab at [place] on 1st floor. If you have a lab appointment with the Cancer Center please come in thru the  Main Entrance and check in at the main information desk   Today you received the following chemotherapy agents:  Taxol and carboplatin  If you develop nausea and vomiting, or diarrhea that is not controlled by your medication, call the clinic.  The clinic phone number is [phone]. Office hours are Monday-Friday 8:30am-5:00pm.  BELOW ARE SYMPTOMS THAT SHOULD BE REPORTED IMMEDIATELY:  *FEVER GREATER THAN 101.0 F  *CHILLS WITH OR WITHOUT FEVER  NAUSEA AND VOMITING THAT IS NOT CONTROLLED WITH YOUR NAUSEA MEDICATION  *UNUSUAL SHORTNESS OF BREATH  *UNUSUAL BRUISING OR BLEEDING  TENDERNESS IN MOUTH AND THROAT WITH OR WITHOUT PRESENCE OF ULCERS  *URINARY PROBLEMS  *BOWEL PROBLEMS  UNUSUAL RASH Items with * indicate a potential emergency and should be followed up as soon as possible. If you have an emergency after office hours please contact your primary care physician or go to the nearest emergency department.  Please call the clinic during office hours if you have any questions or concerns.   You may also contact the Patient Navigator at [phone] should you have any questions or need assistance in obtaining follow up care.      Resources For Cancer Patients and their Caregivers ? American Cancer Society: Can assist with transportation, wigs, general needs, runs Look Good Feel Better.        1-888-227-6333 ? Cancer Care: Provides financial assistance, online support groups, medication/co-pay assistance.  1-800-813-HOPE (4673) ? [facility] Assists [place] cancer patients and their families through emotional , educational and financial support.   336-427-4357 ? [place] DSS Where to apply for food stamps, Medicaid and utility assistance. 336-342-1394 ? RCATS: Transportation to medical appointments. 336-347-2287 ? Social Security Administration: May apply for disability if have a Stage IV cancer. 336-342-7796 1-800-772-1213 ? [place] Aging, Disability and Transit Services: Assists with nutrition, care and transit needs. [phone]         

## 2016-06-30 NOTE — Progress Notes (Signed)
Tolerated tx w/o adverse reaction.  Alert, in no distress.  VSS.  Discharged ambulatory. 

## 2016-06-30 NOTE — Assessment & Plan Note (Signed)
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  EUS felt not to be beneficial.  Started concomitant chemoXRT on 06/02/2016 with Carboplatin/Paclitaxel weekly.  Oncology history updated.  Pre-treatment labs today: CBC diff, CMET.  I personally reviewed and went over laboratory results with the patient.  The results are noted within this dictation.  Labs satisfy treatment parameters today.  He will continue with IV fluids on Monday-Wednesday-Fridays. He is tolerating this very well. His renal function has improved since starting this aggressive IV fluid therapy.  He reports chest pain with food consumption. The patient's provide some education regarding radiation-induced esophagitis.  He is utilizing ensure/boost for food supplementation. His weight is very stable.  He has an appointment later today with Dr. Servando Snare for thoracic consultation. The patient reports that he has transportation duration established for this appointment but will be confirmed at 2:00 this afternoon. I will update Dr. Servando Snare accordingly.  Return next week for day 36 of treatment and follow-up.  He finishes XRT on 07/14/2016.  Adding another cycle of chemotherapy would fall on 07/14/2016 and therefore I am not sure it would beneficial.   For his chest discomfort, I will give Hydrocodone RX.  This is printed.  Return as scheduled for follow-up and treatment.

## 2016-06-30 NOTE — Progress Notes (Signed)
This encounter was created in error - please disregard.

## 2016-06-30 NOTE — Patient Instructions (Signed)
[place] at [facility] Discharge Instructions  RECOMMENDATIONS MADE BY THE CONSULTANT AND ANY TEST RESULTS WILL BE SENT TO YOUR REFERRING PHYSICIAN.  You were seen today by Kirby Crigler, PA-C Follow up as previously scheduled for appointments and IV fluids  Thank you for choosing [place] at [facility] to provide your oncology and hematology care.  To afford each patient quality time with our provider, please arrive at least 15 minutes before your scheduled appointment time.    If you have a lab appointment with the [place] please come in thru the  Main Entrance and check in at the main information desk  You need to re-schedule your appointment should you arrive 10 or more minutes late.  We strive to give you quality time with our providers, and arriving late affects you and other patients whose appointments are after yours.  Also, if you no show three or more times for appointments you may be dismissed from the clinic at the providers discretion.     Again, thank you for choosing [facility].  Our hope is that these requests will decrease the amount of time that you wait before being seen by our physicians.       _____________________________________________________________  Should you have questions after your visit to [facility], please contact our office at [phone] between the hours of 8:30 a.m. and 4:30 p.m.  Voicemails left after 4:30 p.m. will not be returned until the following business day.  For prescription refill requests, have your pharmacy contact our office.       Resources For Cancer Patients and their Caregivers ? American Cancer Society: Can assist with transportation, wigs, general needs, runs Look Good Feel Better.        570-742-6871 ? Cancer Care: Provides financial assistance, online support groups, medication/co-pay assistance.  1-800-813-HOPE (854)570-7649) ? [place] Assists [place] cancer patients and their families through emotional , educational and financial support.  (720)429-9642 ? [place] DSS Where to apply for food stamps, Medicaid and utility assistance. [phone] ? RCATS: Transportation to medical appointments. 205-508-6338 ? Social Security Administration: May apply for disability if have a Stage IV cancer. 8670720866 226-005-3943 ? [employer], Disability and Transit Services: Assists with nutrition, care and transit needs. [place] Support Programs: @10RELATIVEDAYS @ > Cancer Support Group  2nd Tuesday of the month 1pm-2pm, Journey Room  > Creative Journey  3rd Tuesday of the month 1130am-1pm, Journey Room  > Look Good Feel Better  1st Wednesday of the month 10am-12 noon, Journey Room (Call [place] to register [phone])

## 2016-07-02 ENCOUNTER — Encounter: Payer: Self-pay | Admitting: *Deleted

## 2016-07-02 ENCOUNTER — Institutional Professional Consult (permissible substitution) (INDEPENDENT_AMBULATORY_CARE_PROVIDER_SITE_OTHER): Payer: Medicaid Other | Admitting: Cardiothoracic Surgery

## 2016-07-02 ENCOUNTER — Ambulatory Visit (HOSPITAL_COMMUNITY): Payer: Medicaid Other

## 2016-07-02 VITALS — BP 114/72 | HR 93 | Resp 16 | Ht 70.5 in | Wt 147.0 lb

## 2016-07-02 DIAGNOSIS — Z8546 Personal history of malignant neoplasm of prostate: Secondary | ICD-10-CM | POA: Diagnosis not present

## 2016-07-02 DIAGNOSIS — C160 Malignant neoplasm of cardia: Secondary | ICD-10-CM | POA: Diagnosis not present

## 2016-07-02 NOTE — Progress Notes (Signed)
[address]       [place] [address]             [phone]                    Hilary M Steve [facility] Medical Record [id number] Date of Birth: 02/05/1956  Referring: Baird Cancer, PA-C Primary Care: [name], MD  Chief Complaint:    Chief Complaint  Patient presents with  . Esophageal Cancer    receiving chemoradiation since 05/31/16..CT C/A/P 04/26/16, PET 05/07/16, EGD, 02/05/16, UPPER ENDOSCOPY WITH BX 04/14/16    History of Present Illness:    Stanley Jimenez 61 y.o. male is seen in the office  today for discussion about surgical resection of esophageal cancer . Patient notes approximately a pound weight loss, says that since he started treatment i swallowing has improved. He continues to be a daily smoker but has cut back from 1 pack a day to several cigarettes a day. He stopped total consumption of alcohol 3 years ago.   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   Zubrod Score: At the time of surgery this patient's most appropriate activity status/level should be described as: []     0    Normal activity, no symptoms [x]     1    Restricted in physical strenuous activity but ambulatory, able to do out light work []     2    Ambulatory and capable of self care, unable to do work activities, up and about               >50 % of waking hours                              []     3    Only limited self care, in bed greater than 50% of waking hours []     4    Completely disabled, no self care, confined to bed or  chair []     5    Moribund   Past Medical History:  Diagnosis Date  . 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 Anxiety   . Arthritis   . Cirrhosis, alcoholic ([place])   . Clubbing of fingers    congenital  . Depression   . Full dentures   . History of PSVT (paroxysmal supraventricular tachycardia)    run of non-sustatined VT 03-10-2015 in setting of alcohol withdrawal in hospital  . History of seizure    03-08-2012  alcohol withdrawal  . History of thrombocytopenia    10/ 2013  in setting of alcohol withdrawal  . Hyperlipidemia   . Hypertension   . Prostate cancer [facility]) urologist-  dr dalhstedt/  oncologist- dr Tammi Klippel   T1c, Gleason 3+3,  PSA 8.89,  vol 27cc  . 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  BIOPSY  04/14/2016   Procedure: BIOPSY;  Surgeon: Rogene Houston, MD;  [facility];  Service: Endoscopy;;  esophagus  . COLONOSCOPY N/A 04/14/2016   Procedure: COLONOSCOPY;  Surgeon: Rogene Houston, MD;  [facility];  Service: Endoscopy;  Laterality: N/A;  . ESOPHAGOGASTRODUODENOSCOPY N/A 04/14/2016   Procedure: ESOPHAGOGASTRODUODENOSCOPY (EGD);  Surgeon: Rogene Houston, MD;  [facility];  Service: Endoscopy;  Laterality: N/A;  1:00  . NO PAST SURGERIES    . PORTACATH PLACEMENT Right 05/31/2016   Procedure: INSERTION OF TUNNELED RIGHT INTERNAL JUGULAR BARD POWERPORT CENTRAL VENOUS CATHETER WITH SUBCUTANEOUS PORT;  Surgeon: Vickie Epley, MD;  Location: [facility];  Service: Vascular;  Laterality: Right;  . RADIOACTIVE SEED IMPLANT N/A 09/20/2014   Procedure: RADIOACTIVE SEED IMPLANT;  Surgeon: Franchot Gallo, MD;  Location: [facility];  Service: Urology;  Laterality: N/A;    77   seeds implanted no seeds found in bladder  .  TRANSURETHRAL RESECTION OF PROSTATE      Family History  Problem Relation Age of Onset  . Alcohol abuse Mother   . Diabetes Sister     Social History   Social History  . Marital status: Single    Spouse name: N/A  . Number of children: N/A  . Years of education: N/A   Occupational History  . Not on file.   Social History Main Topics  . Smoking status: Current Every Day Smoker    Packs/day: 0.10    Years: 20.00    Types: Cigarettes  . Smokeless tobacco: Former Systems developer    Types: [place] date: 05/27/1984     Comment: smokes 2 cigarettes a day  . Alcohol use No     Comment: quit drinking in October 2015. He says before this he says he stayed drunk every day.   . Drug use: No     Comment: last use october 2015  . Sexual activity: No   Other Topics Concern  . Not on file   Social History Narrative  . No narrative on file    History  Smoking Status  . Current Every Day Smoker  . Packs/day: 0.10  . Years: 20.00  . Types: Cigarettes  Smokeless Tobacco  . Former Systems developer  . Types: Chew  . Quit date: 05/27/1984    Comment: smokes 2 cigarettes a day    History  Alcohol Use No    Comment: quit drinking in October 2015. He says before this he says he stayed drunk every day.      No Known Allergies  Current Outpatient Prescriptions  Medication Sig Dispense Refill  . amLODipine (NORVASC) 10 MG tablet TAKE 1 TABLET BY MOUTH EVERY DAY IN THE MORNING FOR HIGH BLOOD PRESSURE 30 tablet 6  . CARBOPLATIN IV Inject into the vein.    . diphenhydramine-acetaminophen (TYLENOL PM) 25-500 MG TABS tablet Take 1 tablet by mouth at bedtime as needed.    Marland Kitchen HYDROcodone-acetaminophen (NORCO/VICODIN) 5-325 MG tablet Take 0.5-1 tablets by mouth every 6 (six) hours as needed for moderate pain. 30 tablet 0  . lidocaine-prilocaine (EMLA) cream Apply a quarter size amount to affected area 1 hour prior to coming to chemotherapy. Cover with plastic. 30 g 2  . ondansetron (ZOFRAN) 8 MG tablet Take  1 tablet (8 mg total) by mouth every 8 (eight) hours as needed for nausea or vomiting. 30 tablet 2  . PACLitaxel (TAXOL IV) Inject into the vein.    Marland Kitchen  pantoprazole (PROTONIX) 40 MG tablet Take 1 tablet (40 mg total) by mouth daily before breakfast. 30 tablet 5  . prochlorperazine (COMPAZINE) 10 MG tablet Take 1 tablet (10 mg total) by mouth every 6 (six) hours as needed for nausea or vomiting. 30 tablet 2  . tamsulosin (FLOMAX) 0.4 MG CAPS capsule Take 1 capsule (0.4 mg total) by mouth daily after supper. 30 capsule 5  . Multiple Vitamins-Minerals (AIRBORNE PO) Take 1 tablet by mouth daily as needed (cold).     No current facility-administered medications for this visit.      Review of Systems:     Cardiac Review of Systems: Y or N  Chest Pain [  n  ]  Resting SOB [ n  ] Exertional SOB  [ y ]  Orthopnea [ n ]   Pedal Edema [ n  ]    Palpitations [ n ] Syncope  [ n ]   Presyncope [n   ]  General Review of Systems: [Y] = yes [  ]=no Constitional: recent weight change Blue.Reese  ];  Wt loss over the last 3 months [ 8 lbs  ] anorexia Blue.Reese  ]; fatigue Blue.Reese  ]; nausea [  ]; night sweats [  ]; fever [  ]; or chills [  ];          Dental: poor dentition[  ]; Last Dentist visit:   Eye : blurred vision [  ]; diplopia [   ]; vision changes [  ];  Amaurosis fugax[  ]; Resp: cough [  ];  wheezing[  ];  hemoptysis[  ]; shortness of breath[  ]; paroxysmal nocturnal dyspnea[ n ]; dyspnea on exertion[  ]; or orthopnea[  ];  GI:  gallstones[  ], vomiting[  ];  dysphagia[  ]; melena[  ];  hematochezia [  ]; heartburn[  ];   Hx of  Colonoscopy[  ]; GU: kidney stones [  ]; hematuria[n  ];   dysuria [  ];  nocturia[  ];  history of     obstruction [  ]; urinary frequency [  ]             Skin: rash, swelling[  ];, hair loss[  ];  peripheral edema[  ];  or itching[  ]; Musculosketetal: myalgias[  ];  joint swelling[  ];  joint erythema[  ];  joint pain[  ];  back pain[  ];  Heme/Lymph: bruising[  ];  bleeding[  ];  anemia[   ];  Neuro: TIA[ n ];  headaches[  ];  stroke[  ];  vertigo[  ];  seizures[  ];   paresthesias[  ];  difficulty walking[ n ];  Psych:depression[  ]; anxiety[  ];  Endocrine: diabetes[  ];  thyroid dysfunction[  ];  Immunizations: Flu up to date [ n ]; Pneumococcal up to date [ n ];  Other:  Physical Exam: BP 114/72 (BP Location: Right Arm, Patient Position: Sitting, Cuff Size: Normal)   Pulse 93   Resp 16   Ht 5' 10.5" (1.791 m)   Wt 147 lb (66.7 kg)   SpO2 98% Comment: ON RA  BMI 20.79 kg/m   PHYSICAL EXAMINATION: General appearance: alert, cooperative, appears stated age and no distress Head: Normocephalic, without obvious abnormality, atraumatic Neck: no adenopathy, no carotid bruit, no JVD, supple, symmetrical, trachea midline and thyroid not enlarged, symmetric, no tenderness/mass/nodules Lymph nodes: Cervical, supraclavicular, and axillary nodes normal. Resp: clear to auscultation bilaterally Back:  symmetric, no curvature. ROM normal. No CVA tenderness. Cardio: regular rate and rhythm, S1, S2 normal, no murmur, click, rub or gallop GI: soft, non-tender; bowel sounds normal; no masses,  no organomegaly Extremities: extremities normal, atraumatic, no cyanosis or edema, Homans sign is negative, no sign of DVT and Clubbing of his fingers is noted Neurologic: Grossly normal Patient has a right sided port in place  Diagnostic Studies & Laboratory data:     Recent Radiology Findings:   Study Result    Study Result   CLINICAL DATA:  60 year old male with history of esophageal cancer. Followup study. Prior history of prostate cancer diagnosed 2 years ago, status post surgery.  EXAM: CT CHEST, ABDOMEN, AND PELVIS WITH CONTRAST  TECHNIQUE: Multidetector CT imaging of the chest, abdomen and pelvis was performed following the standard protocol during bolus administration of intravenous contrast.  CONTRAST:  170mL ISOVUE-300 IOPAMIDOL (ISOVUE-300) INJECTION  61%  COMPARISON:  No priors.  FINDINGS: CT CHEST FINDINGS  Cardiovascular: Heart size is normal. There is no significant pericardial fluid, thickening or pericardial calcification. There is aortic atherosclerosis, as well as atherosclerosis of the great vessels of the mediastinum and the coronary arteries, including calcified atherosclerotic plaque in the left main, left anterior descending, left circumflex and right coronary arteries.  Mediastinum/Nodes: No pathologically enlarged mediastinal or hilar lymph nodes. There is some amorphous thickening of the gastroesophageal junction, but no discrete esophageal mass is confidently identified by CT examination. No axillary lymphadenopathy.  Lungs/Pleura: No suspicious appearing pulmonary nodules or masses. No acute consolidative airspace disease. No pleural effusions.  Musculoskeletal: There are no aggressive appearing lytic or blastic lesions noted in the visualized portions of the skeleton.  CT ABDOMEN PELVIS FINDINGS  Hepatobiliary: There is a cluster of sub cm low-attenuation lesions in the segment 7 of the liver which are too small to characterize on today's examination. No larger more suspicious appearing hepatic lesions are noted. No intra or extrahepatic biliary ductal dilatation. Gallbladder is normal in appearance.  Pancreas: No pancreatic mass. No pancreatic ductal dilatation. No pancreatic or peripancreatic fluid or inflammatory changes.  Spleen: Unremarkable.  Adrenals/Urinary Tract: Several sub cm low-attenuation lesions are noted in the kidneys bilaterally, too small to definitively characterize, but statistically likely to represent tiny cysts. Bilateral adrenal glands are normal in appearance. No hydroureteronephrosis. Urinary bladder is normal in appearance.  Stomach/Bowel: Thickening of the gastroesophageal junction is noted, without a discrete mass. Remainder of the stomach is  otherwise normal in appearance. No pathologic dilatation of small bowel or colon. Normal appendix.  Vascular/Lymphatic: Aortic atherosclerosis, without evidence of aneurysm or dissection in the abdominal or pelvic vasculature. There is a prominent borderline enlarged lymph node in the gastrohepatic ligament (image 53 of series 2) adjacent to the prominent gastroesophageal junction, which is nonspecific, but concerning for potential nodal disease. No other lymphadenopathy is confidently identified elsewhere in the abdomen or pelvis.  Reproductive: Brachytherapy implants throughout the prostate gland. Seminal vesicles are unremarkable in appearance.  Other: No significant volume of ascites.  No pneumoperitoneum.  Musculoskeletal: There are no aggressive appearing lytic or blastic lesions noted in the visualized portions of the skeleton.  IMPRESSION: 1. Prominent gastroesophageal junction without discrete esophageal mass. Notably, there is also a borderline enlarged enhancing lymph node in the gastrohepatic ligament immediately adjacent to the gastroesophageal junction, which is nonspecific, but could indicate early nodal disease. 2. The small cluster of low-attenuation lesions in segment 7 of the liver are too small to characterize. Statistically, these are likely to  represent cysts. If there is clinical concern for metastatic disease to the liver, these could be further characterized with MRI of the abdomen with and without IV gadolinium at this time. 3. No other potential sites of metastatic disease noted elsewhere in the chest, abdomen or pelvis. 4. Aortic atherosclerosis, in addition to left main and 3 vessel coronary artery disease. Please note that although the presence of coronary artery calcium documents the presence of coronary artery disease, the severity of this disease and any potential stenosis cannot be assessed on this non-gated CT examination. Assessment  for potential risk factor modification, dietary therapy or pharmacologic therapy may be warranted, if clinically indicated. 5. Postprocedural changes of prostate brachytherapy. 6. Additional incidental findings, as above.   Electronically Signed   By: Vinnie Langton M.D.   On: 04/26/2016 12:43       CLINICAL DATA:  Initial treatment strategy for esophageal squamous cell carcinoma.  EXAM: NUCLEAR MEDICINE PET SKULL BASE TO THIGH  TECHNIQUE: 7.1 mCi F-18 FDG was injected intravenously. Full-ring PET imaging was performed from the skull base to thigh after the radiotracer. CT data was obtained and used for attenuation correction and anatomic localization.  FASTING BLOOD GLUCOSE:  Value: 87 mg/dl  COMPARISON:  CT on 04/26/2016  FINDINGS: NECK  No hypermetabolic lymph nodes in the neck.  CHEST  Focal hypermetabolic activity is seen in the lower thoracic esophagus corresponding to concentric esophageal wall thickening, which has SUV max of 15.3.  No hypermetabolic mediastinal or hilar nodes. No suspicious pulmonary nodules on the CT scan.  Aortic atherosclerosis.   Coronary artery calcification.  ABDOMEN/PELVIS  No abnormal hypermetabolic activity within the liver, pancreas, adrenal glands, or spleen.  11 mm lymph node in the upper gastrohepatic ligament along the anterior aspect of the GE junction is hypermetabolic, with SUV max of 7.2. No other hypermetabolic lymph nodes identified within abdomen or pelvis.  Tiny sub-cm low-attenuation lesions in the posterior right hepatic lobe show no corresponding hypermetabolic activity. Aortic atherosclerosis. Prostate brachytherapy seeds again noted.  SKELETON  No focal hypermetabolic activity to suggest skeletal metastasis.  IMPRESSION: Hypermetabolic bilateral lower thoracic esophageal mass, consistent with known primary esophageal carcinoma.  Small hypermetabolic lymph node in the upper  gastrohepatic ligament adjacent to GE junction, consistent with lymph node metastasis.  No other sites of metastatic disease identified.   Electronically Signed   By: Earle Gell M.D.   On: 05/07/2016 17:01     I have independently reviewed the above radiologic studies.  Recent Lab Findings: Lab Results  Component Value Date   WBC 4.4 06/30/2016   HGB 11.3 (L) 06/30/2016   HCT 32.9 (L) 06/30/2016   PLT 121 (L) 06/30/2016   GLUCOSE 122 (H) 06/30/2016   CHOL 181 09/24/2015   TRIG 100 09/24/2015   HDL 60 09/24/2015   LDLCALC 101 09/24/2015   ALT 19 06/30/2016   AST 18 06/30/2016   NA 135 06/30/2016   K 3.7 06/30/2016   CL 102 06/30/2016   CREATININE 0.79 06/30/2016   BUN 17 06/30/2016   CO2 26 06/30/2016   TSH 0.83 09/24/2015   INR 1.05 09/13/2014   HGBA1C 5.9 (H) 01/17/2014   Findings:      The proximal esophagus was normal.      One cratered esophageal ulcer with no bleeding and no stigmata of recent       bleeding was found 33 to 36 cm from the incisors. The lesion was 30 mm  in largest dimension. Biopsies were taken with a cold forceps for       histology.      The distal esophagus was normal.      The Z-line was regular and was found 39 cm from the incisors.      A 2 cm hiatal hernia was present.      The entire examined stomach was normal.      The duodenal bulb and second portion of the duodenum were normal. Impression:               - Normal proximal esophagus.                           - Non-bleeding esophageal ulcer. Endoscopic                            appearance consistent with malignant ulcer possibly                            squamous cell carcinoma. Biopsied.                           - Normal distal 3 cm of esophageal mucosa distal to                            lesion.                           - Z-line regular, 39 cm from the incisors.                           - 2 cm hiatal hernia.                           - Normal stomach.                            - Normal duodenal bulb and second portion of the                            duodenum.   Assessment / Plan:      Patient is a 61 year old male with distal esophageal cancer squamous cell, currently undergoing chemotherapy and radiation treatment. He does have area on PET scan soon chest above noted metastasis in the vicinity of the GE junction, but no evidence of distal metastasis. I discussed with the patient the pros and cons of proceeding with surgical resection of the esophagus after completion of radiation and chemotherapy. The patient is unsure if he would consider surgery but is willing to return several weeks after completion of his radiation therapy with a restaging CT scan of the chest and abdomen and further discussion about the pros and cons of surgical resection after the completion of neoadjuvant therapy.    I  spent 40 minutes counseling the patient face to face and 50% or more the  time was spent in counseling and coordination of care. The total time spent in the appointment was 60 minutes.  Grace Isaac MD      [address] [place] [address]  Office [phone]   Beeper [phone]  07/02/2016 1:28 PM

## 2016-07-05 ENCOUNTER — Encounter (HOSPITAL_COMMUNITY): Payer: Medicaid Other | Attending: Oncology

## 2016-07-05 VITALS — BP 117/65 | HR 70 | Temp 98.0°F | Resp 18

## 2016-07-05 DIAGNOSIS — Z95828 Presence of other vascular implants and grafts: Secondary | ICD-10-CM

## 2016-07-05 DIAGNOSIS — C159 Malignant neoplasm of esophagus, unspecified: Secondary | ICD-10-CM

## 2016-07-05 MED ORDER — HEPARIN SOD (PORK) LOCK FLUSH 100 UNIT/ML IV SOLN
500.0000 [IU] | Freq: Once | INTRAVENOUS | Status: AC
  Administered 2016-07-05: 500 [IU] via INTRAVENOUS
  Filled 2016-07-05: qty 5

## 2016-07-05 MED ORDER — SODIUM CHLORIDE 0.9 % IV SOLN
INTRAVENOUS | Status: AC
  Administered 2016-07-05: 13:00:00 via INTRAVENOUS

## 2016-07-05 NOTE — Progress Notes (Signed)
Tolerated IV fluids well. Stable and ambulatory on discharge home to self. 

## 2016-07-05 NOTE — Patient Instructions (Signed)
[place] at [facility] Discharge Instructions  RECOMMENDATIONS MADE BY THE CONSULTANT AND ANY TEST RESULTS WILL BE SENT TO YOUR REFERRING PHYSICIAN.  IV fluids given as ordered.  Thank you for choosing [place] at [facility] to provide your oncology and hematology care.  To afford each patient quality time with our provider, please arrive at least 15 minutes before your scheduled appointment time.    If you have a lab appointment with the [facility] please come in thru the  Main Entrance and check in at the main information desk  You need to re-schedule your appointment should you arrive 10 or more minutes late.  We strive to give you quality time with our providers, and arriving late affects you and other patients whose appointments are after yours.  Also, if you no show three or more times for appointments you may be dismissed from the clinic at the providers discretion.     Again, thank you for choosing [facility].  Our hope is that these requests will decrease the amount of time that you wait before being seen by our physicians.       _____________________________________________________________  Should you have questions after your visit to [facility], please contact our office at [phone] between the hours of 8:30 a.m. and 4:30 p.m.  Voicemails left after 4:30 p.m. will not be returned until the following business day.  For prescription refill requests, have your pharmacy contact our office.       Resources For Cancer Patients and their Caregivers ? American Cancer Society: Can assist with transportation, wigs, general needs, runs Look Good Feel Better.        (651) 406-4859 ? Cancer Care: Provides financial assistance, online support groups, medication/co-pay assistance.  1-800-813-HOPE (404)867-6853) ? [place] Assists [place] cancer patients and their families  through emotional , educational and financial support.  (213) 353-6595 ? [place] DSS Where to apply for food stamps, Medicaid and utility assistance. (272)109-3621 ? RCATS: Transportation to medical appointments. (401)132-9419 ? Social Security Administration: May apply for disability if have a Stage IV cancer. (920) 353-8863 (463) 305-9134 ? [employer], Disability and Transit Services: Assists with nutrition, care and transit needs. [place] Support Programs: @10RELATIVEDAYS @ > Cancer Support Group  2nd Tuesday of the month 1pm-2pm, Journey Room  > Creative Journey  3rd Tuesday of the month 1130am-1pm, Journey Room  > Look Good Feel Better  1st Wednesday of the month 10am-12 noon, Journey Room (Call [place] to register [phone])

## 2016-07-07 ENCOUNTER — Encounter (HOSPITAL_BASED_OUTPATIENT_CLINIC_OR_DEPARTMENT_OTHER): Payer: Medicaid Other | Admitting: Oncology

## 2016-07-07 ENCOUNTER — Encounter (HOSPITAL_BASED_OUTPATIENT_CLINIC_OR_DEPARTMENT_OTHER): Payer: Medicaid Other

## 2016-07-07 ENCOUNTER — Encounter (HOSPITAL_COMMUNITY): Payer: Self-pay | Admitting: Oncology

## 2016-07-07 VITALS — BP 128/71 | HR 86 | Temp 98.0°F | Resp 16 | Wt 144.8 lb

## 2016-07-07 DIAGNOSIS — C61 Malignant neoplasm of prostate: Secondary | ICD-10-CM | POA: Diagnosis not present

## 2016-07-07 DIAGNOSIS — C159 Malignant neoplasm of esophagus, unspecified: Secondary | ICD-10-CM

## 2016-07-07 DIAGNOSIS — Z5111 Encounter for antineoplastic chemotherapy: Secondary | ICD-10-CM | POA: Diagnosis present

## 2016-07-07 LAB — COMPREHENSIVE METABOLIC PANEL
ALK PHOS: 53 U/L (ref 38–126)
ALT: 20 U/L (ref 17–63)
ANION GAP: 7 (ref 5–15)
AST: 18 U/L (ref 15–41)
Albumin: 3.8 g/dL (ref 3.5–5.0)
BUN: 18 mg/dL (ref 6–20)
CALCIUM: 8.9 mg/dL (ref 8.9–10.3)
CO2: 25 mmol/L (ref 22–32)
CREATININE: 0.87 mg/dL (ref 0.61–1.24)
Chloride: 102 mmol/L (ref 101–111)
Glucose, Bld: 137 mg/dL — ABNORMAL HIGH (ref 65–99)
Potassium: 3.6 mmol/L (ref 3.5–5.1)
Sodium: 134 mmol/L — ABNORMAL LOW (ref 135–145)
TOTAL PROTEIN: 6.7 g/dL (ref 6.5–8.1)
Total Bilirubin: 0.5 mg/dL (ref 0.3–1.2)

## 2016-07-07 LAB — CBC WITH DIFFERENTIAL/PLATELET
Basophils Absolute: 0 10*3/uL (ref 0.0–0.1)
Basophils Relative: 1 %
EOS ABS: 0.1 10*3/uL (ref 0.0–0.7)
Eosinophils Relative: 1 %
HEMATOCRIT: 32.1 % — AB (ref 39.0–52.0)
HEMOGLOBIN: 11.2 g/dL — AB (ref 13.0–17.0)
LYMPHS ABS: 0.5 10*3/uL — AB (ref 0.7–4.0)
LYMPHS PCT: 14 %
MCH: 30 pg (ref 26.0–34.0)
MCHC: 34.9 g/dL (ref 30.0–36.0)
MCV: 86.1 fL (ref 78.0–100.0)
MONOS PCT: 10 %
Monocytes Absolute: 0.4 10*3/uL (ref 0.1–1.0)
NEUTROS ABS: 2.6 10*3/uL (ref 1.7–7.7)
NEUTROS PCT: 74 %
Platelets: 126 10*3/uL — ABNORMAL LOW (ref 150–400)
RBC: 3.73 MIL/uL — AB (ref 4.22–5.81)
RDW: 15.4 % (ref 11.5–15.5)
WBC: 3.6 10*3/uL — ABNORMAL LOW (ref 4.0–10.5)

## 2016-07-07 MED ORDER — FAMOTIDINE IN NACL 20-0.9 MG/50ML-% IV SOLN: 20.0000 mg | Freq: Two times a day (BID) | INTRAVENOUS | Status: DC

## 2016-07-07 MED ORDER — SODIUM CHLORIDE 0.9 % IV SOLN
20.0000 mg | Freq: Once | INTRAVENOUS | Status: AC
  Administered 2016-07-07: 20 mg via INTRAVENOUS
  Filled 2016-07-07: qty 2

## 2016-07-07 MED ORDER — FAMOTIDINE IN NACL 20-0.9 MG/50ML-% IV SOLN
20.0000 mg | Freq: Once | INTRAVENOUS | Status: AC
  Administered 2016-07-07: 20 mg via INTRAVENOUS
  Filled 2016-07-07: qty 50

## 2016-07-07 MED ORDER — PALONOSETRON HCL INJECTION 0.25 MG/5ML
0.2500 mg | Freq: Once | INTRAVENOUS | Status: AC
  Administered 2016-07-07: 0.25 mg via INTRAVENOUS

## 2016-07-07 MED ORDER — SODIUM CHLORIDE 0.9% FLUSH
10.0000 mL | Freq: Once | INTRAVENOUS | Status: AC
Start: 2016-07-07 — End: 2016-07-07
  Administered 2016-07-07: 10 mL via INTRAVENOUS

## 2016-07-07 MED ORDER — HEPARIN SOD (PORK) LOCK FLUSH 100 UNIT/ML IV SOLN
500.0000 [IU] | Freq: Once | INTRAVENOUS | Status: AC
  Administered 2016-07-07: 500 [IU] via INTRAVENOUS
  Filled 2016-07-07: qty 5

## 2016-07-07 MED ORDER — PALONOSETRON HCL INJECTION 0.25 MG/5ML: 0.2500 mg | Freq: Once | INTRAVENOUS | Status: DC

## 2016-07-07 MED ORDER — SODIUM CHLORIDE 0.9 % IV SOLN
INTRAVENOUS | Status: DC
  Administered 2016-07-07: 12:00:00 via INTRAVENOUS

## 2016-07-07 MED ORDER — SODIUM CHLORIDE 0.9 % IV SOLN: INTRAVENOUS | Status: DC

## 2016-07-07 MED ORDER — SODIUM CHLORIDE 0.9 % IV SOLN
220.6000 mg | Freq: Once | INTRAVENOUS | Status: AC
  Administered 2016-07-07: 220 mg via INTRAVENOUS
  Filled 2016-07-07: qty 22

## 2016-07-07 MED ORDER — DIPHENHYDRAMINE HCL 50 MG/ML IJ SOLN
50.0000 mg | Freq: Once | INTRAMUSCULAR | Status: AC
  Administered 2016-07-07: 50 mg via INTRAVENOUS

## 2016-07-07 MED ORDER — DEXTROSE 5 % IV SOLN
50.0000 mg/m2 | Freq: Once | INTRAVENOUS | Status: AC
  Administered 2016-07-07: 90 mg via INTRAVENOUS
  Filled 2016-07-07: qty 15

## 2016-07-07 MED ORDER — DIPHENHYDRAMINE HCL 50 MG/ML IJ SOLN
50.0000 mg | Freq: Once | INTRAMUSCULAR | Status: DC
  Filled 2016-07-07: qty 1

## 2016-07-07 MED ORDER — SODIUM CHLORIDE 0.9 % IV SOLN: 20.0000 mg | Freq: Once | INTRAVENOUS | Status: DC

## 2016-07-07 NOTE — Patient Instructions (Signed)
[facility] Discharge Instructions for Patients Receiving Chemotherapy   Beginning January 23rd 2017 lab work for the Cancer Center will be done in the  Main lab at [facility] on 1st floor. If you have a lab appointment with the Cancer Center please come in thru the  Main Entrance and check in at the main information desk   Today you received the following chemotherapy agents   To help prevent nausea and vomiting after your treatment, we encourage you to take your nausea medication     If you develop nausea and vomiting, or diarrhea that is not controlled by your medication, call the clinic.  The clinic phone number is [phone]. Office hours are Monday-Friday 8:30am-5:00pm.  BELOW ARE SYMPTOMS THAT SHOULD BE REPORTED IMMEDIATELY:  *FEVER GREATER THAN 101.0 F  *CHILLS WITH OR WITHOUT FEVER  NAUSEA AND VOMITING THAT IS NOT CONTROLLED WITH YOUR NAUSEA MEDICATION  *UNUSUAL SHORTNESS OF BREATH  *UNUSUAL BRUISING OR BLEEDING  TENDERNESS IN MOUTH AND THROAT WITH OR WITHOUT PRESENCE OF ULCERS  *URINARY PROBLEMS  *BOWEL PROBLEMS  UNUSUAL RASH Items with * indicate a potential emergency and should be followed up as soon as possible. If you have an emergency after office hours please contact your primary care physician or go to the nearest emergency department.  Please call the clinic during office hours if you have any questions or concerns.   You may also contact the Patient Navigator at [phone] should you have any questions or need assistance in obtaining follow up care.      Resources For Cancer Patients and their Caregivers ? American Cancer Society: Can assist with transportation, wigs, general needs, runs Look Good Feel Better.        1-888-227-6333 ? Cancer Care: Provides financial assistance, online support groups, medication/co-pay assistance.  1-800-813-HOPE (4673) ? [facility] Assists [place] cancer  patients and their families through emotional , educational and financial support.  336-427-4357 ? [place] DSS Where to apply for food stamps, Medicaid and utility assistance. 336-342-1394 ? RCATS: Transportation to medical appointments. 336-347-2287 ? Social Security Administration: May apply for disability if have a Stage IV cancer. 336-342-7796 1-800-772-1213 ? [place] [facility], Disability and Transit Services: Assists with nutrition, care and transit needs. 336-349-2343         

## 2016-07-07 NOTE — Progress Notes (Addendum)
Stanley Nakayama, MD [address], [address] [place] [address]  Squamous cell carcinoma of esophagus [facility]) - Plan: Amb Referral to Nutrition and Diabetic E  CURRENT THERAPY: Concomitant chemoXRT with Carboplatin/Paclitaxel weekly beginning on 06/02/2016  INTERVAL HISTORY: Stanley Jimenez 61 y.o. 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  EUS felt not to be beneficial.    Malignant neoplasm of prostate ([place])   04/23/2014 Procedure    [place] and prostate biopsy      04/23/2014 Pathology Results    2/12 cores positive for adenocarcinoma.  Gleason 3+3 = 6 pattern.      08/14/2014 Initial Diagnosis    Malignant neoplasm of prostate [facility])      - 09/20/2014 Radiation Therapy    Brachytherapy with I-125       Squamous cell carcinoma of esophagus ([place])   04/14/2016 Procedure    EGD by Dr. Laural Golden.      04/16/2016 Pathology Results    Esophagus, biopsy - INVASIVE POORLY DIFFERENTIATED SQUAMOUS CELL CARCINOMA.      04/26/2016 Imaging    CT CAP- 1. Prominent gastroesophageal junction without discrete esophageal mass. Notably, there is also a borderline enlarged enhancing lymph node in the gastrohepatic ligament immediately adjacent to the gastroesophageal junction, which is nonspecific, but could indicate early nodal disease. 2. The small cluster of low-attenuation lesions in segment 7 of the liver are too small to characterize. Statistically, these are likely to represent cysts. If there is clinical concern for metastatic disease to the liver, these could be further characterized with MRI of the abdomen with and without IV gadolinium at this time. 3. No other potential sites of metastatic disease noted elsewhere in the chest, abdomen or pelvis.      05/10/2016 PET scan    Hypermetabolic bilateral lower thoracic esophageal mass, consistent with known primary esophageal  carcinoma.  Small hypermetabolic lymph node in the upper gastrohepatic ligament adjacent to GE junction, consistent with lymph node metastasis.  No other sites of metastatic disease identified.      05/31/2016 Procedure    Port placed by Dr. Rosana Hoes      06/02/2016 -  Chemotherapy    The patient had palonosetron (ALOXI) injection 0.25 mg, 0.25 mg, Intravenous,  Once, 1 of 1 cycle  CARBOplatin (PARAPLATIN) 200 mg in sodium chloride 0.9 % 250 mL chemo infusion, 200 mg (100 % of original dose 203.4 mg), Intravenous,  Once, 1 of 1 cycle Dose modification:   (original dose 203.4 mg, Cycle 1)  PACLitaxel (TAXOL) 90 mg in dextrose 5 % 250 mL chemo infusion ( for chemotherapy treatment.        06/02/2016 -  Radiation Therapy    Finishes on 07/14/2016       He continues to tolerate treatment well. He reports a stable appetite, but he has lost approximate 5 pounds since starting treatment.  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  This is exacerbated with swallowing foods. He notes that soft foods are easier and less painful. He notes that he has not used any pain medication I provided him. He notes that the pain has not been bad enough to require pain medication.  He denies any nausea or vomiting. He is using ensure to supplement his diet. I provided him with some samples today.  I reviewed the role of surgery in his cancer care if indicated in the future. He's hasn't  undergo surgery but is willing to learn of this option.  Review of Systems  Constitutional: Positive for weight loss. Negative for chills and fever.  HENT: Negative.  Negative for sore throat.   Eyes: Negative.   Respiratory: Negative.  Negative for cough.   Cardiovascular: Positive for chest pain (with swallowing).  Gastrointestinal: Negative.  Negative for abdominal pain, constipation, diarrhea, nausea and vomiting.  Genitourinary: Negative.   Musculoskeletal: Negative.   Skin: Negative.   Neurological:  Negative.  Negative for weakness.  Endo/Heme/Allergies: Negative.   Psychiatric/Behavioral: Negative.     Past Medical History:  Diagnosis Date  . 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 Anxiety   . Arthritis   . Cirrhosis, alcoholic ([place])   . Clubbing of fingers    congenital  . Depression   . Full dentures   . History of PSVT (paroxysmal supraventricular tachycardia)    run of non-sustatined VT 03-10-2015 in setting of alcohol withdrawal in hospital  . History of seizure    03-08-2012  alcohol withdrawal  . History of thrombocytopenia    10/ 2013  in setting of alcohol withdrawal  . Hyperlipidemia   . Hypertension   . Prostate cancer [facility]) urologist-  dr dalhstedt/  oncologist- dr Tammi Klippel   T1c, Gleason 3+3,  PSA 8.89,  vol 27cc  . 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 Squamous cell carcinoma of esophagus ([place]) 04/21/2016    Past Surgical History:  Procedure Laterality Date  . BIOPSY  04/14/2016   Procedure: BIOPSY;  Surgeon: Rogene Houston, MD;  Location[facility];  Service: Endoscopy;;  esophagus  . COLONOSCOPY N/A 04/14/2016   Procedure: COLONOSCOPY;  Surgeon: Rogene Houston, MD;  Location: [facility];  Service: Endoscopy;  Laterality: N/A;  . ESOPHAGOGASTRODUODENOSCOPY N/A 04/14/2016   Procedure: ESOPHAGOGASTRODUODENOSCOPY (EGD);  Surgeon: Rogene Houston, MD;  Location: [facility];  Service: Endoscopy;  Laterality: N/A;  1:00  . NO PAST SURGERIES    . PORTACATH PLACEMENT Right 05/31/2016   Procedure: INSERTION OF TUNNELED RIGHT INTERNAL JUGULAR BARD POWERPORT CENTRAL VENOUS CATHETER WITH SUBCUTANEOUS PORT;  Surgeon: Vickie Epley, MD;  Location: [facility] ORS;  Service: Vascular;  Laterality: Right;  . RADIOACTIVE SEED IMPLANT N/A 09/20/2014   Procedure: RADIOACTIVE SEED IMPLANT;  Surgeon: Franchot Gallo, MD;  [facility];  Service: Urology;   Laterality: N/A;    77   seeds implanted no seeds found in bladder  . TRANSURETHRAL RESECTION OF PROSTATE      Family History  Problem Relation Age of Onset  . Alcohol abuse Mother   . Diabetes Sister     Social History   Social History  . Marital status: Single    Spouse name: N/A  . Number of children: N/A  . Years of education: N/A   Social History Main Topics  . Smoking status: Current Every Day Smoker    Packs/day: 0.10    Years: 20.00    Types: Cigarettes  . Smokeless tobacco: Former Systems developer    Types: [place] date: 05/27/1984     Comment: smokes 2 cigarettes a day  . Alcohol use No     Comment: quit drinking in October 2015. He says before this he says he stayed drunk every day.   . Drug use: No     Comment: last use  october 2015  . Sexual activity: No   Other Topics Concern  . None   Social History Narrative  . None     PHYSICAL EXAMINATION  ECOG PERFORMANCE STATUS: 1 - Symptomatic but completely ambulatory  There were no vitals filed for this visit.  Vitals - 1 value per visit 0000000  SYSTOLIC 0000000  DIASTOLIC 71  Pulse 86  Temperature 98  Respirations 16  Weight (lb) 144.8    GENERAL:alert, no distress, well nourished, well developed, comfortable, cooperative, smiling and unaccompanied, in chemo-recliner. SKIN: skin color, texture, turgor are normal, no rashes or significant lesions HEAD: Normocephalic, No masses, lesions, tenderness or abnormalities EYES: normal, EOMI, Conjunctiva are pink and non-injected EARS: External ears normal OROPHARYNX:lips, buccal mucosa, and tongue normal and mucous membranes are moist  NECK: supple, no adenopathy, trachea midline LYMPH:  no palpable lymphadenopathy BREAST:not examined LUNGS: clear to auscultation  HEART: regular rate & rhythm, no murmurs and no gallops ABDOMEN:abdomen soft and normal bowel sounds BACK: Back symmetric, no curvature. EXTREMITIES:less then 2 second capillary refill, no joint  deformities, effusion, or inflammation, no skin discoloration, no cyanosis  NEURO: alert & oriented x 3 with fluent speech, no focal motor/sensory deficits, gait normal   LABORATORY DATA: CBC    Component Value Date/Time   WBC 3.6 (L) 07/07/2016 0933   RBC 3.73 (L) 07/07/2016 0933   HGB 11.2 (L) 07/07/2016 0933   HCT 32.1 (L) 07/07/2016 0933   PLT 126 (L) 07/07/2016 0933   MCV 86.1 07/07/2016 0933   MCH 30.0 07/07/2016 0933   MCHC 34.9 07/07/2016 0933   RDW 15.4 07/07/2016 0933   LYMPHSABS 0.5 (L) 07/07/2016 0933   MONOABS 0.4 07/07/2016 0933   EOSABS 0.1 07/07/2016 0933   BASOSABS 0.0 07/07/2016 0933      Chemistry      Component Value Date/Time   NA 134 (L) 07/07/2016 0933   K 3.6 07/07/2016 0933   CL 102 07/07/2016 0933   CO2 25 07/07/2016 0933   BUN 18 07/07/2016 0933   CREATININE 0.87 07/07/2016 0933   CREATININE 1.01 09/24/2015 0909      Component Value Date/Time   CALCIUM 8.9 07/07/2016 0933   ALKPHOS 53 07/07/2016 0933   AST 18 07/07/2016 0933   ALT 20 07/07/2016 0933   BILITOT 0.5 07/07/2016 0933        PENDING LABS:   RADIOGRAPHIC STUDIES:  No results found.   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  EUS felt not to be beneficial.  Started concomitant chemoXRT on 06/02/2016 with Carboplatin/Paclitaxel weekly.  Oncology history updated.  Pre-treatment labs today: CBC diff, CMET.  I personally reviewed and went over laboratory results with the patient.  The results are noted within this dictation.  Labs satisfy treatment parameters today.  He will continue with IV fluids on Monday-Wednesday-Fridays. He is tolerating this very well. His renal function has improved since starting this aggressive IV fluid therapy.  He reports chest pain with food consumption. The patient's provide  some education regarding radiation-induced esophagitis.  He reports that soft foods do not induce pain.  He has pain medication at home but has not used it.  He reports that his pain has not been bad enough to need his pain medication.  He is utilizing ensure/boost for food supplementation.  His weight is down about 5 lbs since start of treatment.  I will ask RD to follow patient along.  I have provided him a few ensure/boost samples.  He saw Dr. Servando Snare on 07/02/2016 for thoracic consultation for consideration of surgical resection following completion of chemoXRT.  His note is reviewed in detail: Patient is a 61 year old male with distal esophageal cancer squamous cell, currently undergoing chemotherapy and radiation treatment. He does have area on PET scan soon chest above noted metastasis in the vicinity of the GE junction, but no evidence of distal metastasis. I discussed with the patient the pros and cons of proceeding with surgical resection of the esophagus after completion of radiation and chemotherapy. The patient is unsure if he would consider surgery but is willing to return several weeks after completion of his radiation therapy with a restaging CT scan of the chest and abdomen and further discussion about the pros and cons of surgical resection after the completion of neoadjuvant therapy.   He is provided education regarding tri-modality management of this cancer versus bi-modality management. He is educated that tri-modality management improves cure/survival compared to the bimodality treatment.  He is hesitant about surgery but is willing to learn more in the future following completion of chemotherapy and radiation.  Return next week for day 43 of treatment.  This will mark the completion of his primary treatment with chemoXRT.  Return for ongoing IVF.  Will continue with IVF M-W-F for the next 2-3 weeks and re-evaluate at follow-up appointment.  At follow-up appointment, will schedule for  PET imaging 4-6 weeks out from chemoXRT for response assessment.    ORDERS PLACED FOR THIS ENCOUNTER: Orders Placed This Encounter  Procedures  . Amb Referral to Nutrition and Diabetic E    MEDICATIONS PRESCRIBED THIS ENCOUNTER: No orders of the defined types were placed in this encounter.   THERAPY PLAN:  Continue with weekly Carboplatin/Paclitaxel with XRT finishing on 07/14/2016.  All questions were answered. The patient knows to call the clinic with any problems, questions or concerns. We can certainly see the patient much sooner if necessary.  Patient and plan discussed with Dr. Twana First and she is in agreement with the aforementioned.   This note is electronically signed by: Doy Mince 07/07/2016 4:57 PM

## 2016-07-07 NOTE — Patient Instructions (Signed)
[facility] at [facility] Discharge Instructions  RECOMMENDATIONS MADE BY THE CONSULTANT AND ANY TEST RESULTS WILL BE SENT TO YOUR REFERRING PHYSICIAN.  You were seen today by Tom Kefalas PA-C.   Thank you for choosing [facility] at [facility] to provide your oncology and hematology care.  To afford each patient quality time with our provider, please arrive at least 15 minutes before your scheduled appointment time.    If you have a lab appointment with the Cancer Center please come in thru the  Main Entrance and check in at the main information desk  You need to re-schedule your appointment should you arrive 10 or more minutes late.  We strive to give you quality time with our providers, and arriving late affects you and other patients whose appointments are after yours.  Also, if you no show three or more times for appointments you may be dismissed from the clinic at the providers discretion.     Again, thank you for choosing [facility].  Our hope is that these requests will decrease the amount of time that you wait before being seen by our physicians.       _____________________________________________________________  Should you have questions after your visit to [facility], please contact our office at [phone] between the hours of 8:30 a.m. and 4:30 p.m.  Voicemails left after 4:30 p.m. will not be returned until the following business day.  For prescription refill requests, have your pharmacy contact our office.       Resources For Cancer Patients and their Caregivers ? American Cancer Society: Can assist with transportation, wigs, general needs, runs Look Good Feel Better.        1-888-227-6333 ? Cancer Care: Provides financial assistance, online support groups, medication/co-pay assistance.  1-800-813-HOPE (4673) ? [facility] Assists [place] cancer patients and their  families through emotional , educational and financial support.  336-427-4357 ? [place] DSS Where to apply for food stamps, Medicaid and utility assistance. 336-342-1394 ? RCATS: Transportation to medical appointments. 336-347-2287 ? Social Security Administration: May apply for disability if have a Stage IV cancer. 336-342-7796 1-800-772-1213 ? [place] Aging, Disability and Transit Services: Assists with nutrition, care and transit needs. 336-349-2343  Cancer Center Support Programs: @[email or web address]@ > Cancer Support Group  2nd Tuesday of the month 1pm-2pm, Journey Room  > Creative Journey  3rd Tuesday of the month 1130am-1pm, Journey Room  > Look Good Feel Better  1st Wednesday of the month 10am-12 noon, Journey Room (Call American Cancer Society to register 1-800-395-5775)    

## 2016-07-07 NOTE — Assessment & Plan Note (Addendum)
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  EUS felt not to be beneficial.  Started concomitant chemoXRT on 06/02/2016 with Carboplatin/Paclitaxel weekly.  Oncology history updated.  Pre-treatment labs today: CBC diff, CMET.  I personally reviewed and went over laboratory results with the patient.  The results are noted within this dictation.  Labs satisfy treatment parameters today.  He will continue with IV fluids on Monday-Wednesday-Fridays. He is tolerating this very well. His renal function has improved since starting this aggressive IV fluid therapy.  He reports chest pain with food consumption. The patient's provide some education regarding radiation-induced esophagitis.  He reports that soft foods do not induce pain.  He has pain medication at home but has not used it.  He reports that his pain has not been bad enough to need his pain medication.  He is utilizing ensure/boost for food supplementation. His weight is down about 5 lbs since start of treatment.  I will ask RD to follow patient along.  I have provided him a few ensure/boost samples.  He saw Dr. Servando Snare on 07/02/2016 for thoracic consultation for consideration of surgical resection following completion of chemoXRT.  His note is reviewed in detail: Patient is a 61 year old male with distal esophageal cancer squamous cell, currently undergoing chemotherapy and radiation treatment. He does have area on PET scan soon chest above noted metastasis in the vicinity of the GE junction, but no evidence of distal metastasis. I discussed with the patient the pros and cons of proceeding with surgical resection of the esophagus after completion of radiation and chemotherapy. The patient is unsure if he would consider surgery but is willing to return several weeks after completion of his radiation therapy with a restaging CT scan of the chest and abdomen and further discussion about the pros and  cons of surgical resection after the completion of neoadjuvant therapy.   He is provided education regarding tri-modality management of this cancer versus bi-modality management. He is educated that tri-modality management improves cure/survival compared to the bimodality treatment.  He is hesitant about surgery but is willing to learn more in the future following completion of chemotherapy and radiation.  Return next week for day 43 of treatment.  This will mark the completion of his primary treatment with chemoXRT.  Return for ongoing IVF.  Will continue with IVF M-W-F for the next 2-3 weeks and re-evaluate at follow-up appointment.  At follow-up appointment, will schedule for PET imaging 4-6 weeks out from chemoXRT for response assessment.

## 2016-07-07 NOTE — Progress Notes (Signed)
Chemotherapy today per orders. Patient tolerated it well. Vitals stable and discharged home from clinic ambulatory. Follow up as scheduled.

## 2016-07-07 NOTE — Addendum Note (Signed)
Addended by: B[facility] on: 07/07/2016 04:58 PM   Modules accepted: Orders

## 2016-07-09 ENCOUNTER — Encounter (HOSPITAL_BASED_OUTPATIENT_CLINIC_OR_DEPARTMENT_OTHER): Payer: Medicaid Other

## 2016-07-09 VITALS — BP 110/60 | HR 72 | Temp 98.0°F | Resp 18 | Wt 145.2 lb

## 2016-07-09 DIAGNOSIS — C159 Malignant neoplasm of esophagus, unspecified: Secondary | ICD-10-CM

## 2016-07-09 MED ORDER — SODIUM CHLORIDE 0.9 % IV SOLN
INTRAVENOUS | Status: DC
  Administered 2016-07-09: 14:00:00 via INTRAVENOUS

## 2016-07-09 MED ORDER — HEPARIN SOD (PORK) LOCK FLUSH 100 UNIT/ML IV SOLN
500.0000 [IU] | Freq: Once | INTRAVENOUS | Status: AC
  Administered 2016-07-09: 500 [IU] via INTRAVENOUS

## 2016-07-09 MED ORDER — SODIUM CHLORIDE 0.9% FLUSH
10.0000 mL | INTRAVENOUS | Status: DC | PRN
  Administered 2016-07-09: 10 mL via INTRAVENOUS
  Filled 2016-07-09: qty 10

## 2016-07-09 NOTE — Patient Instructions (Signed)
[facility] at [facility] Discharge Instructions  RECOMMENDATIONS MADE BY THE CONSULTANT AND ANY TEST RESULTS WILL BE SENT TO YOUR REFERRING PHYSICIAN.  Received IV fluids today. Follow-up as scheduled. Call clinic for any questions or concerns  Thank you for choosing [facility] at [facility] to provide your oncology and hematology care.  To afford each patient quality time with our provider, please arrive at least 15 minutes before your scheduled appointment time.    If you have a lab appointment with the Cancer Center please come in thru the  Main Entrance and check in at the main information desk  You need to re-schedule your appointment should you arrive 10 or more minutes late.  We strive to give you quality time with our providers, and arriving late affects you and other patients whose appointments are after yours.  Also, if you no show three or more times for appointments you may be dismissed from the clinic at the providers discretion.     Again, thank you for choosing [facility].  Our hope is that these requests will decrease the amount of time that you wait before being seen by our physicians.       _____________________________________________________________  Should you have questions after your visit to [facility], please contact our office at [phone] between the hours of 8:30 a.m. and 4:30 p.m.  Voicemails left after 4:30 p.m. will not be returned until the following business day.  For prescription refill requests, have your pharmacy contact our office.       Resources For Cancer Patients and their Caregivers ? American Cancer Society: Can assist with transportation, wigs, general needs, runs Look Good Feel Better.        1-888-227-6333 ? Cancer Care: Provides financial assistance, online support groups, medication/co-pay assistance.  1-800-813-HOPE (4673) ? [facility] Assists [place] cancer patients and their families through emotional , educational and financial support.  336-427-4357 ? [place] DSS Where to apply for food stamps, Medicaid and utility assistance. 336-342-1394 ? RCATS: Transportation to medical appointments. 336-347-2287 ? Social Security Administration: May apply for disability if have a Stage IV cancer. 336-342-7796 1-800-772-1213 ? [place] Aging, Disability and Transit Services: Assists with nutrition, care and transit needs. 336-349-2343  Cancer Center Support Programs: @[email or web address]@ > Cancer Support Group  2nd Tuesday of the month 1pm-2pm, Journey Room  > Creative Journey  3rd Tuesday of the month 1130am-1pm, Journey Room  > Look Good Feel Better  1st Wednesday of the month 10am-12 noon, Journey Room (Call American Cancer Society to register 1-800-395-5775)   

## 2016-07-09 NOTE — Progress Notes (Signed)
Stanley Jimenez Sailors tolerated IV fluids well without complaints or incident. VSS upon discharge. Pt discharged self ambulatory in satisfactory condition

## 2016-07-13 ENCOUNTER — Encounter (HOSPITAL_BASED_OUTPATIENT_CLINIC_OR_DEPARTMENT_OTHER): Payer: Medicaid Other

## 2016-07-13 VITALS — BP 109/66 | HR 80 | Temp 98.7°F | Resp 18 | Wt 145.0 lb

## 2016-07-13 DIAGNOSIS — C159 Malignant neoplasm of esophagus, unspecified: Secondary | ICD-10-CM | POA: Diagnosis present

## 2016-07-13 MED ORDER — SODIUM CHLORIDE 0.9% FLUSH
10.0000 mL | INTRAVENOUS | Status: DC | PRN
  Administered 2016-07-13: 10 mL via INTRAVENOUS
  Filled 2016-07-13: qty 10

## 2016-07-13 MED ORDER — HEPARIN SOD (PORK) LOCK FLUSH 100 UNIT/ML IV SOLN
500.0000 [IU] | Freq: Once | INTRAVENOUS | Status: AC
  Administered 2016-07-13: 500 [IU] via INTRAVENOUS
  Filled 2016-07-13: qty 5

## 2016-07-13 MED ORDER — EPOETIN ALFA 10000 UNIT/ML IJ SOLN
INTRAMUSCULAR | Status: AC
  Filled 2016-07-13: qty 1

## 2016-07-13 MED ORDER — SODIUM CHLORIDE 0.9 % IV SOLN
INTRAVENOUS | Status: DC
  Administered 2016-07-13: 14:00:00 via INTRAVENOUS

## 2016-07-13 NOTE — Progress Notes (Signed)
Stanley Jimenez tolerated IV fluids well without complaints or incident. VSS upon discharge. Pt discharged self ambulatory in satisfactory condition

## 2016-07-13 NOTE — Patient Instructions (Signed)
[facility] at [facility] Discharge Instructions  RECOMMENDATIONS MADE BY THE CONSULTANT AND ANY TEST RESULTS WILL BE SENT TO YOUR REFERRING PHYSICIAN.  Received IV fluids today. Follow-up as scheduled. Call clinic for any questions or concerns  Thank you for choosing [facility] at [facility] to provide your oncology and hematology care.  To afford each patient quality time with our provider, please arrive at least 15 minutes before your scheduled appointment time.    If you have a lab appointment with the Cancer Center please come in thru the  Main Entrance and check in at the main information desk  You need to re-schedule your appointment should you arrive 10 or more minutes late.  We strive to give you quality time with our providers, and arriving late affects you and other patients whose appointments are after yours.  Also, if you no show three or more times for appointments you may be dismissed from the clinic at the providers discretion.     Again, thank you for choosing [facility].  Our hope is that these requests will decrease the amount of time that you wait before being seen by our physicians.       _____________________________________________________________  Should you have questions after your visit to [facility], please contact our office at [phone] between the hours of 8:30 a.m. and 4:30 p.m.  Voicemails left after 4:30 p.m. will not be returned until the following business day.  For prescription refill requests, have your pharmacy contact our office.       Resources For Cancer Patients and their Caregivers ? American Cancer Society: Can assist with transportation, wigs, general needs, runs Look Good Feel Better.        1-888-227-6333 ? Cancer Care: Provides financial assistance, online support groups, medication/co-pay assistance.  1-800-813-HOPE (4673) ? [facility] Assists [place] cancer patients and their families through emotional , educational and financial support.  336-427-4357 ? [place] DSS Where to apply for food stamps, Medicaid and utility assistance. 336-342-1394 ? RCATS: Transportation to medical appointments. 336-347-2287 ? Social Security Administration: May apply for disability if have a Stage IV cancer. 336-342-7796 1-800-772-1213 ? [place] Aging, Disability and Transit Services: Assists with nutrition, care and transit needs. 336-349-2343  Cancer Center Support Programs: @10RELATIVEDAYS@ > Cancer Support Group  2nd Tuesday of the month 1pm-2pm, Journey Room  > Creative Journey  3rd Tuesday of the month 1130am-1pm, Journey Room  > Look Good Feel Better  1st Wednesday of the month 10am-12 noon, Journey Room (Call American Cancer Society to register 1-800-395-5775)   

## 2016-07-14 ENCOUNTER — Ambulatory Visit (HOSPITAL_COMMUNITY): Payer: Medicaid Other

## 2016-07-14 ENCOUNTER — Other Ambulatory Visit: Payer: Self-pay | Admitting: *Deleted

## 2016-07-15 ENCOUNTER — Encounter (HOSPITAL_BASED_OUTPATIENT_CLINIC_OR_DEPARTMENT_OTHER): Payer: Medicaid Other

## 2016-07-15 VITALS — BP 121/68 | HR 84 | Temp 98.4°F | Resp 18 | Wt 144.2 lb

## 2016-07-15 DIAGNOSIS — C159 Malignant neoplasm of esophagus, unspecified: Secondary | ICD-10-CM | POA: Diagnosis not present

## 2016-07-15 DIAGNOSIS — Z5111 Encounter for antineoplastic chemotherapy: Secondary | ICD-10-CM | POA: Diagnosis not present

## 2016-07-15 LAB — COMPREHENSIVE METABOLIC PANEL
ALBUMIN: 3.6 g/dL (ref 3.5–5.0)
ALT: 24 U/L (ref 17–63)
AST: 16 U/L (ref 15–41)
Alkaline Phosphatase: 62 U/L (ref 38–126)
Anion gap: 8 (ref 5–15)
BILIRUBIN TOTAL: 0.5 mg/dL (ref 0.3–1.2)
BUN: 18 mg/dL (ref 6–20)
CHLORIDE: 99 mmol/L — AB (ref 101–111)
CO2: 27 mmol/L (ref 22–32)
Calcium: 9.2 mg/dL (ref 8.9–10.3)
Creatinine, Ser: 0.84 mg/dL (ref 0.61–1.24)
GFR calc Af Amer: >60 mL/min (ref >60)
GFR calc non Af Amer: >60 mL/min (ref >60)
Glucose, Bld: 117 mg/dL — ABNORMAL HIGH (ref 65–99)
POTASSIUM: 3.7 mmol/L (ref 3.5–5.1)
Sodium: 134 mmol/L — ABNORMAL LOW (ref 135–145)
Total Protein: 6.9 g/dL (ref 6.5–8.1)

## 2016-07-15 LAB — CBC WITH DIFFERENTIAL/PLATELET
BASOS ABS: 0 10*3/uL (ref 0.0–0.1)
BASOS PCT: 0 %
Eosinophils Absolute: 0 10*3/uL (ref 0.0–0.7)
Eosinophils Relative: 1 %
HEMATOCRIT: 31.2 % — AB (ref 39.0–52.0)
Hemoglobin: 10.9 g/dL — ABNORMAL LOW (ref 13.0–17.0)
Lymphocytes Relative: 12 %
Lymphs Abs: 0.5 10*3/uL — ABNORMAL LOW (ref 0.7–4.0)
MCH: 30 pg (ref 26.0–34.0)
MCHC: 34.9 g/dL (ref 30.0–36.0)
MCV: 86 fL (ref 78.0–100.0)
MONO ABS: 0.4 10*3/uL (ref 0.1–1.0)
Monocytes Relative: 9 %
NEUTROS ABS: 3.4 10*3/uL (ref 1.7–7.7)
Neutrophils Relative %: 78 %
PLATELETS: 112 10*3/uL — AB (ref 150–400)
RBC: 3.63 MIL/uL — AB (ref 4.22–5.81)
RDW: 15.6 % — AB (ref 11.5–15.5)
WBC: 4.3 10*3/uL (ref 4.0–10.5)

## 2016-07-15 MED ORDER — PACLITAXEL CHEMO INJECTION 300 MG/50ML
50.0000 mg/m2 | Freq: Once | INTRAVENOUS | Status: AC
  Administered 2016-07-15: 90 mg via INTRAVENOUS
  Filled 2016-07-15: qty 15

## 2016-07-15 MED ORDER — HEPARIN SOD (PORK) LOCK FLUSH 100 UNIT/ML IV SOLN
500.0000 [IU] | Freq: Once | INTRAVENOUS | Status: AC | PRN
  Administered 2016-07-15: 500 [IU]
  Filled 2016-07-15: qty 5

## 2016-07-15 MED ORDER — PALONOSETRON HCL INJECTION 0.25 MG/5ML
0.2500 mg | Freq: Once | INTRAVENOUS | Status: AC
  Administered 2016-07-15: 0.25 mg via INTRAVENOUS
  Filled 2016-07-15: qty 5

## 2016-07-15 MED ORDER — SODIUM CHLORIDE 0.9% FLUSH
10.0000 mL | INTRAVENOUS | Status: DC | PRN
  Administered 2016-07-15: 10 mL
  Filled 2016-07-15: qty 10

## 2016-07-15 MED ORDER — SODIUM CHLORIDE 0.9 % IV SOLN
226.8000 mg | Freq: Once | INTRAVENOUS | Status: AC
  Administered 2016-07-15: 230 mg via INTRAVENOUS
  Filled 2016-07-15: qty 23

## 2016-07-15 MED ORDER — DIPHENHYDRAMINE HCL 50 MG/ML IJ SOLN
50.0000 mg | Freq: Once | INTRAMUSCULAR | Status: AC
Start: 2016-07-15 — End: 2016-07-15
  Administered 2016-07-15: 50 mg via INTRAVENOUS
  Filled 2016-07-15: qty 1

## 2016-07-15 MED ORDER — FAMOTIDINE IN NACL 20-0.9 MG/50ML-% IV SOLN
20.0000 mg | Freq: Once | INTRAVENOUS | Status: AC
  Administered 2016-07-15: 20 mg via INTRAVENOUS
  Filled 2016-07-15: qty 50

## 2016-07-15 MED ORDER — SODIUM CHLORIDE 0.9 % IV SOLN
Freq: Once | INTRAVENOUS | Status: AC
  Administered 2016-07-15: 11:00:00 via INTRAVENOUS

## 2016-07-15 MED ORDER — DEXAMETHASONE SODIUM PHOSPHATE 100 MG/10ML IJ SOLN
20.0000 mg | Freq: Once | INTRAMUSCULAR | Status: AC
  Administered 2016-07-15: 20 mg via INTRAVENOUS
  Filled 2016-07-15: qty 2

## 2016-07-15 NOTE — Progress Notes (Signed)
Stanley Jimenez tolerated chemo tx well without complaints or incident. Labs reviewed prior to administering chemotherapy. VSS upon discharge. Pt discharged self ambulatory in satisfactory condition

## 2016-07-15 NOTE — Patient Instructions (Signed)
[facility] Discharge Instructions for Patients Receiving Chemotherapy   Beginning January 23rd 2017 lab work for the Cancer Center will be done in the  Main lab at [facility] on 1st floor. If you have a lab appointment with the Cancer Center please come in thru the  Main Entrance and check in at the main information desk   Today you received the following chemotherapy agents Taxol and Carboplatin. Follow-up as scheduled. Call clinic for any questions or concerns  To help prevent nausea and vomiting after your treatment, we encourage you to take your nausea medication.   If you develop nausea and vomiting, or diarrhea that is not controlled by your medication, call the clinic.  The clinic phone number is [phone]. Office hours are Monday-Friday 8:30am-5:00pm.  BELOW ARE SYMPTOMS THAT SHOULD BE REPORTED IMMEDIATELY:  *FEVER GREATER THAN 101.0 F  *CHILLS WITH OR WITHOUT FEVER  NAUSEA AND VOMITING THAT IS NOT CONTROLLED WITH YOUR NAUSEA MEDICATION  *UNUSUAL SHORTNESS OF BREATH  *UNUSUAL BRUISING OR BLEEDING  TENDERNESS IN MOUTH AND THROAT WITH OR WITHOUT PRESENCE OF ULCERS  *URINARY PROBLEMS  *BOWEL PROBLEMS  UNUSUAL RASH Items with * indicate a potential emergency and should be followed up as soon as possible. If you have an emergency after office hours please contact your primary care physician or go to the nearest emergency department.  Please call the clinic during office hours if you have any questions or concerns.   You may also contact the Patient Navigator at [phone] should you have any questions or need assistance in obtaining follow up care.      Resources For Cancer Patients and their Caregivers ? American Cancer Society: Can assist with transportation, wigs, general needs, runs Look Good Feel Better.        1-888-227-6333 ? Cancer Care: Provides financial assistance, online support groups, medication/co-pay assistance.   1-800-813-HOPE (4673) ? [facility] Assists [place] cancer patients and their families through emotional , educational and financial support.  336-427-4357 ? [place] DSS Where to apply for food stamps, Medicaid and utility assistance. 336-342-1394 ? RCATS: Transportation to medical appointments. 336-347-2287 ? Social Security Administration: May apply for disability if have a Stage IV cancer. 336-342-7796 1-800-772-1213 ? [place] Aging, Disability and Transit Services: Assists with nutrition, care and transit needs. 336-349-2343         

## 2016-07-16 ENCOUNTER — Encounter (HOSPITAL_BASED_OUTPATIENT_CLINIC_OR_DEPARTMENT_OTHER): Payer: Medicaid Other

## 2016-07-16 ENCOUNTER — Encounter (HOSPITAL_COMMUNITY): Payer: Medicaid Other

## 2016-07-16 DIAGNOSIS — C159 Malignant neoplasm of esophagus, unspecified: Secondary | ICD-10-CM | POA: Diagnosis not present

## 2016-07-16 MED ORDER — SODIUM CHLORIDE 0.9 % IV SOLN
INTRAVENOUS | Status: DC
  Administered 2016-07-16: 13:00:00 via INTRAVENOUS

## 2016-07-16 MED ORDER — HEPARIN SOD (PORK) LOCK FLUSH 100 UNIT/ML IV SOLN
500.0000 [IU] | Freq: Once | INTRAVENOUS | Status: AC
  Administered 2016-07-16: 500 [IU] via INTRAVENOUS

## 2016-07-16 MED ORDER — SODIUM CHLORIDE 0.9% FLUSH
10.0000 mL | INTRAVENOUS | Status: DC | PRN
  Administered 2016-07-16: 10 mL via INTRAVENOUS
  Filled 2016-07-16: qty 10

## 2016-07-16 NOTE — Progress Notes (Signed)
One liter of fluids given today per orders. Patients vitals stable and discharged home from clinic ambulatory. Follow up as scheduled.

## 2016-07-16 NOTE — Patient Instructions (Signed)
[place] at [facility] Discharge Instructions  RECOMMENDATIONS MADE BY THE CONSULTANT AND ANY TEST RESULTS WILL BE SENT TO YOUR REFERRING PHYSICIAN.  Fluids given  Follow up as scheduled.  Thank you for choosing [place] at [facility] to provide your oncology and hematology care.  To afford each patient quality time with our provider, please arrive at least 15 minutes before your scheduled appointment time.    If you have a lab appointment with the [facility] please come in thru the  Main Entrance and check in at the main information desk  You need to re-schedule your appointment should you arrive 10 or more minutes late.  We strive to give you quality time with our providers, and arriving late affects you and other patients whose appointments are after yours.  Also, if you no show three or more times for appointments you may be dismissed from the clinic at the providers discretion.     Again, thank you for choosing [facility].  Our hope is that these requests will decrease the amount of time that you wait before being seen by our physicians.       _____________________________________________________________  Should you have questions after your visit to [facility], please contact our office at [phone] between the hours of 8:30 a.m. and 4:30 p.m.  Voicemails left after 4:30 p.m. will not be returned until the following business day.  For prescription refill requests, have your pharmacy contact our office.       Resources For Cancer Patients and their Caregivers ? American Cancer Society: Can assist with transportation, wigs, general needs, runs Look Good Feel Better.        3011130493 ? Cancer Care: Provides financial assistance, online support groups, medication/co-pay assistance.  1-800-813-HOPE 984-794-0778) ? [facility] Assists [place] cancer patients and their  families through emotional , educational and financial support.  706-291-2970 ? [place] DSS Where to apply for food stamps, Medicaid and utility assistance. 7192661468 ? RCATS: Transportation to medical appointments. (445) 855-7117 ? Social Security Administration: May apply for disability if have a Stage IV cancer. 340-038-4487 (775) 245-5346 ? [employer], Disability and Transit Services: Assists with nutrition, care and transit needs. [place] Support Programs: @10RELATIVEDAYS @ > Cancer Support Group  2nd Tuesday of the month 1pm-2pm, Journey Room  > Creative Journey  3rd Tuesday of the month 1130am-1pm, Journey Room  > Look Good Feel Better  1st Wednesday of the month 10am-12 noon, Journey Room (Call [place] to register [phone])

## 2016-07-16 NOTE — Progress Notes (Signed)
Nutrition Assessment   Reason for Assessment:   Weight loss.  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  Patient finished radiation therapy on 07/14/16 and receiving chemotherapy.  Noted there is discussion about surgery.  Past medical history of etoh use but none currently, prostate cancer, HLD, HTN, cirrhosis  Patient seen while receiving IV fluids today in clinic. Patient reports mostly has been eating ice cream, pudding, soft foods.  Reports that he has been drinking 2 ensure per day.  Reports drank ensure this am and ate grapes. Last night for dinner had 1/2 [place] sandwich and ensure.  Reports pain and points to chest area.  No nausea, vomiting. Reports no taste for foods.    Reports bowel movement about 2 times per day.  Nutrition Focused Physical Exam: Nutrition-Focused physical exam completed. Findings are moderate bicep area, mild ribs and under eyes fat depletion, moderate temple, calf, knee area muscle depletion, and no edema.   Patient lives alone and has little dog. Moves about independently in house   Medications: MVI, zofran, compazine, protonix  Labs: Na 134, glucose 117  Anthropometrics:   Height: 70 inches Weight: 144 pounds UBW: 150s, noted 153 pounds 03/2016  BMI: 20  5% weight loss in the last 3 months   Estimated Energy Needs  Kcals: [id number] calories/d Protein: 78-98 g /d Fluid: 2.2 L/d  NUTRITION DIAGNOSIS: Unintentional weight loss related to cancer and cancer related treatments as evidenced by 5% weight loss in the last 3 months   MALNUTRITION DIAGNOSIS: will continue to monitor   INTERVENTION:   Discussed soft moist protein foods and fact sheet provided.  Teach back used Discussed ways to increase calories and protein.  Encouraged chopping/grinding foods well and adding gravy and sauces to make foods liquid.  Patient does not have blender at home.   Encouraged intake of ensure plus/boost plus at least 3-4  per day. Discussed ensure assistance program. Gave patient 1st complimentary case of ensure plus today. Coupons given as well. Encouraged small frequent meals. Patient verbalized understanding.      MONITORING, EVALUATION, GOAL: Patient will consume adequate calories and protein to maintain weight and prevent further weight loss   NEXT VISIT: March 2nd during fluids  Najla Aughenbaugh B. Zenia Resides, [place], [place] Registered Dietitian [phone] (pager)

## 2016-07-20 ENCOUNTER — Encounter (HOSPITAL_BASED_OUTPATIENT_CLINIC_OR_DEPARTMENT_OTHER): Payer: Medicaid Other

## 2016-07-20 ENCOUNTER — Encounter (HOSPITAL_BASED_OUTPATIENT_CLINIC_OR_DEPARTMENT_OTHER): Payer: Medicaid Other | Admitting: Oncology

## 2016-07-20 ENCOUNTER — Encounter (HOSPITAL_COMMUNITY): Payer: Self-pay | Admitting: Oncology

## 2016-07-20 VITALS — BP 109/64 | HR 86 | Temp 98.6°F | Resp 18 | Wt 140.2 lb

## 2016-07-20 DIAGNOSIS — C159 Malignant neoplasm of esophagus, unspecified: Secondary | ICD-10-CM | POA: Diagnosis present

## 2016-07-20 DIAGNOSIS — R079 Chest pain, unspecified: Secondary | ICD-10-CM

## 2016-07-20 DIAGNOSIS — C61 Malignant neoplasm of prostate: Secondary | ICD-10-CM

## 2016-07-20 DIAGNOSIS — L409 Psoriasis, unspecified: Secondary | ICD-10-CM

## 2016-07-20 DIAGNOSIS — R112 Nausea with vomiting, unspecified: Secondary | ICD-10-CM

## 2016-07-20 MED ORDER — HEPARIN SOD (PORK) LOCK FLUSH 100 UNIT/ML IV SOLN
500.0000 [IU] | Freq: Once | INTRAVENOUS | Status: AC
  Administered 2016-07-20: 500 [IU] via INTRAVENOUS

## 2016-07-20 MED ORDER — HEPARIN SOD (PORK) LOCK FLUSH 100 UNIT/ML IV SOLN
INTRAVENOUS | Status: AC
  Filled 2016-07-20: qty 5

## 2016-07-20 MED ORDER — SODIUM CHLORIDE 0.9 % IV SOLN
INTRAVENOUS | Status: DC
  Administered 2016-07-20: 13:00:00 via INTRAVENOUS

## 2016-07-20 NOTE — Patient Instructions (Signed)
[facility] at [facility] Discharge Instructions  RECOMMENDATIONS MADE BY THE CONSULTANT AND ANY TEST RESULTS WILL BE SENT TO YOUR REFERRING PHYSICIAN.  IV fluids today. Return as scheduled.   Thank you for choosing [facility] at [facility] to provide your oncology and hematology care.  To afford each patient quality time with our provider, please arrive at least 15 minutes before your scheduled appointment time.    If you have a lab appointment with the Cancer Center please come in thru the  Main Entrance and check in at the main information desk  You need to re-schedule your appointment should you arrive 10 or more minutes late.  We strive to give you quality time with our providers, and arriving late affects you and other patients whose appointments are after yours.  Also, if you no show three or more times for appointments you may be dismissed from the clinic at the providers discretion.     Again, thank you for choosing [facility].  Our hope is that these requests will decrease the amount of time that you wait before being seen by our physicians.       _____________________________________________________________  Should you have questions after your visit to [facility], please contact our office at [phone] between the hours of 8:30 a.m. and 4:30 p.m.  Voicemails left after 4:30 p.m. will not be returned until the following business day.  For prescription refill requests, have your pharmacy contact our office.       Resources For Cancer Patients and their Caregivers ? American Cancer Society: Can assist with transportation, wigs, general needs, runs Look Good Feel Better.        1-888-227-6333 ? Cancer Care: Provides financial assistance, online support groups, medication/co-pay assistance.  1-800-813-HOPE (4673) ? [facility] Assists [place] cancer patients and their  families through emotional , educational and financial support.  336-427-4357 ? [place] DSS Where to apply for food stamps, Medicaid and utility assistance. 336-342-1394 ? RCATS: Transportation to medical appointments. 336-347-2287 ? Social Security Administration: May apply for disability if have a Stage IV cancer. 336-342-7796 1-800-772-1213 ? [place] Aging, Disability and Transit Services: Assists with nutrition, care and transit needs. 336-349-2343  Cancer Center Support Programs: @10RELATIVEDAYS@ > Cancer Support Group  2nd Tuesday of the month 1pm-2pm, Journey Room  > Creative Journey  3rd Tuesday of the month 1130am-1pm, Journey Room  > Look Good Feel Better  1st Wednesday of the month 10am-12 noon, Journey Room (Call American Cancer Society to register 1-800-395-5775)   

## 2016-07-20 NOTE — Progress Notes (Signed)
Tolerated infusion w/o adverse reaction.  Alert, in no distress.  VSS.  Discharged ambulatory.  

## 2016-07-20 NOTE — Patient Instructions (Addendum)
[place] at [facility] Discharge Instructions  RECOMMENDATIONS MADE BY THE CONSULTANT AND ANY TEST RESULTS WILL BE SENT TO YOUR REFERRING PHYSICIAN.  You were seen today by Kirby Crigler PA-C. Return weekly for labs. Will call you with scan appointment. Return in 4 weeks for follow up.    Thank you for choosing [place] at [facility] to provide your oncology and hematology care.  To afford each patient quality time with our provider, please arrive at least 15 minutes before your scheduled appointment time.    If you have a lab appointment with the [facility] please come in thru the  Main Entrance and check in at the main information desk  You need to re-schedule your appointment should you arrive 10 or more minutes late.  We strive to give you quality time with our providers, and arriving late affects you and other patients whose appointments are after yours.  Also, if you no show three or more times for appointments you may be dismissed from the clinic at the providers discretion.     Again, thank you for choosing [facility].  Our hope is that these requests will decrease the amount of time that you wait before being seen by our physicians.       _____________________________________________________________  Should you have questions after your visit to [facility], please contact our office at [phone] between the hours of 8:30 a.m. and 4:30 p.m.  Voicemails left after 4:30 p.m. will not be returned until the following business day.  For prescription refill requests, have your pharmacy contact our office.       Resources For Cancer Patients and their Caregivers ? American Cancer Society: Can assist with transportation, wigs, general needs, runs Look Good Feel Better.        628-231-6529 ? Cancer Care: Provides financial assistance, online support groups, medication/co-pay assistance.   1-800-813-HOPE 3105070825) ? [facility] Assists [place] cancer patients and their families through emotional , educational and financial support.  3184910943 ? [place] DSS Where to apply for food stamps, Medicaid and utility assistance. 660 197 5477 ? RCATS: Transportation to medical appointments. 870-705-4848 ? Social Security Administration: May apply for disability if have a Stage IV cancer. 629-258-1673 513-224-8953 ? [employer], Disability and Transit Services: Assists with nutrition, care and transit needs. [place] Support Programs: @10RELATIVEDAYS @ > Cancer Support Group  2nd Tuesday of the month 1pm-2pm, Journey Room  > Creative Journey  3rd Tuesday of the month 1130am-1pm, Journey Room  > Look Good Feel Better  1st Wednesday of the month 10am-12 noon, Journey Room (Call [place] to register [phone])

## 2016-07-20 NOTE — Assessment & Plan Note (Addendum)
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  EUS felt not to be beneficial.  S/P concomitant chemoXRT with weekly Carboplatin/Paclitaxel 06/02/2016- 07/14/2016.  Treatment tolerated very well.  Oncology history updated.  Labs today: CBC diff, CMET.  I personally reviewed and went over laboratory results with the patient.  The results are noted within this dictation.    IVF today.  We had a long discussion.  Patient appears to be maintaining good hydration status.  He wants to stop IVF at this time and this is certainly reasonable.  His transportation difficulties played a part in this patient decision as well.  Weekly labs: CBC diff, BMET.  If renal function change is noted, he will be re-called for IVF.  He reports nausea with vomiting x past 72 hours.  He is seen eating peanut butter crackers in the chemo-area.  He denies any nausea/vomiting currently.  He admits that he DID NOT take his home antiemetics.  He is given education regarding Zofran/Compazine.  He is given a nausea sheet for patient education.  He is advised to call with any nausea/vomiting refractory to home antiemetic regimen.  He is having psoriasis outbreaks on the extensor surfaces of his joints.  He has a long-standing history of psoriasis.  He is using triamcinolone cream at home.  He knows that it is worse since chemotherapy.  He will continue with current regimen and will follow moving forward.  He reports chest pain with food consumption. The patient's provide some education regarding radiation-induced esophagitis.  He reports that soft foods do not induce pain.  He has pain medication at home but has not used it.  He reports that his pain has not been bad enough to need his pain medication.  RD is following patient along.  He will be referred to [facility].  He saw Dr. Servando Snare on 07/02/2016 for thoracic consultation for consideration of surgical  resection following completion of chemoXRT.  His note is reviewed in detail: Patient is a 61 year old male with distal esophageal cancer squamous cell, currently undergoing chemotherapy and radiation treatment. He does have area on PET scan soon chest above noted metastasis in the vicinity of the GE junction, but no evidence of distal metastasis. I discussed with the patient the pros and cons of proceeding with surgical resection of the esophagus after completion of radiation and chemotherapy. The patient is unsure if he would consider surgery but is willing to return several weeks after completion of his radiation therapy with a restaging CT scan of the chest and abdomen and further discussion about the pros and cons of surgical resection after the completion of neoadjuvant therapy.   He is provided education regarding tri-modality management of this cancer versus bi-modality management. He is educated that tri-modality management improves cure/survival compared to the bimodality treatment.  He is hesitant about surgery but is willing to learn more in the future following completion of chemotherapy and radiation.  I have sent Dr. Servando Snare a message regarding response evaluation imaging.  I am not sure the utility of PET to evaluate previously identified hypermetabolic lymph node in the gastrohepatic region and the role that this lymph node may play from a surgical perspective.  If relevant, then PET imaging may be the best restaging study.  If not important, then CT imaging would be most appropriate (and would save the patient a trip to [facility] given his transportation situation).  Return in 4 weeks for follow-up.  Addendum: Based  upon discussion with Dr. Servando Snare, we will proceed with PET imaging in [place].  Order is placed.  Dr. Servando Snare will plan on seeing him back following PET scan.

## 2016-07-20 NOTE — Progress Notes (Addendum)
[name], MD [address] [place] [address]  Squamous cell carcinoma of esophagus [facility]) - Plan: NM PET Image Restag (PS) Skull Base To Thigh, CBC with Differential, Basic metabolic panel, Ambulatory referral to Speech Therapy, Ambulatory referral to Occupational Therapy  CURRENT THERAPY: Planning for response evaluation in near future.  INTERVAL HISTORY: Stanley Jimenez 61 y.o. 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  EUS felt not to be beneficial.  S/P concomitant chemoXRT with weekly Carboplatin/Paclitaxel 06/02/2016- 07/14/2016.    Malignant neoplasm of prostate ([place])   04/23/2014 Procedure    [place] and prostate biopsy      04/23/2014 Pathology Results    2/12 cores positive for adenocarcinoma.  Gleason 3+3 = 6 pattern.      08/14/2014 Initial Diagnosis    Malignant neoplasm of prostate [facility])      - 09/20/2014 Radiation Therapy    Brachytherapy with I-125       Squamous cell carcinoma of esophagus ([place])   04/14/2016 Procedure    EGD by Dr. Laural Golden.      04/16/2016 Pathology Results    Esophagus, biopsy - INVASIVE POORLY DIFFERENTIATED SQUAMOUS CELL CARCINOMA.      04/26/2016 Imaging    CT CAP- 1. Prominent gastroesophageal junction without discrete esophageal mass. Notably, there is also a borderline enlarged enhancing lymph node in the gastrohepatic ligament immediately adjacent to the gastroesophageal junction, which is nonspecific, but could indicate early nodal disease. 2. The small cluster of low-attenuation lesions in segment 7 of the liver are too small to characterize. Statistically, these are likely to represent cysts. If there is clinical concern for metastatic disease to the liver, these could be further characterized with MRI of the abdomen with and without IV gadolinium at this time. 3. No other potential sites of metastatic disease noted  elsewhere in the chest, abdomen or pelvis.      05/10/2016 PET scan    Hypermetabolic bilateral lower thoracic esophageal mass, consistent with known primary esophageal carcinoma.  Small hypermetabolic lymph node in the upper gastrohepatic ligament adjacent to GE junction, consistent with lymph node metastasis.  No other sites of metastatic disease identified.      05/31/2016 Procedure    Port placed by Dr. Rosana Hoes      06/02/2016 - 07/14/2016 Chemotherapy    Weekly Carboplatin/Paclitaxel with XRT.  Tolerated extremely well.       06/02/2016 - 07/14/2016 Radiation Therapy    [place].  Tolerated well.       He is doing relatively well.  He does report a 72 hour episode of nausea with vomiting.  He denies any nausea or vomiting recently/currently.  He admits that he failed to take his home antiemetics.  He provided significant education regarding his home antiemetics.  I reviewed the as needed dosing for Zofran and Compazine.  He reports that he will start taking this at home if his nausea/vomiting recurs.  He is also advised that if his home antiemetics are ineffective, he is to call so we can change his antiemetic regimen and possibly have him come to the clinic for IV antiemetics.  His weight is down a few pounds likely secondary to the above.  We discussed the future plan including IV fluids.  He notes that he is drinking plenty of water at home and does not want IV fluids.  I think since he is  doing well, other than the recent bout of nausea with vomiting, this is certainly reasonable to try him without IV fluids knowing that he will have laboratory work.  He is agreeable to this plan.  His private transportation issues certainly played a role in his desire not to return for IV fluids twice per week.  He notes that his sister can bring him for laboratory work weekly.  He is advised that if his renal function changes, he will be recalled to the clinic for IV fluids.  Reviewed his future  plan to include restaging scans with consideration for surgery.  He is agreeable with this plan but he is hesitant about surgery.  He is agreeable to learn about surgery and follow-up with cardiothoracic surgery as directed.  Review of Systems  Constitutional: Positive for weight loss. Negative for chills and fever.  HENT: Negative.  Negative for sore throat.   Eyes: Negative.   Respiratory: Negative.  Negative for cough.   Cardiovascular: Positive for chest pain (with swallowing).  Gastrointestinal: Negative.  Negative for abdominal pain, constipation, diarrhea, nausea and vomiting.  Genitourinary: Negative.   Musculoskeletal: Negative.   Skin: Negative.   Neurological: Negative.  Negative for weakness.  Endo/Heme/Allergies: Negative.   Psychiatric/Behavioral: Negative.     Past Medical History:  Diagnosis Date  . 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 Anxiety   . Arthritis   . Cirrhosis, alcoholic ([place])   . Clubbing of fingers    congenital  . Depression   . Full dentures   . History of PSVT (paroxysmal supraventricular tachycardia)    run of non-sustatined VT 03-10-2015 in setting of alcohol withdrawal in hospital  . History of seizure    03-08-2012  alcohol withdrawal  . History of thrombocytopenia    10/ 2013  in setting of alcohol withdrawal  . Hyperlipidemia   . Hypertension   . Prostate cancer [facility]) urologist-  dr dalhstedt/  oncologist- dr Tammi Klippel   T1c, Gleason 3+3,  PSA 8.89,  vol 27cc  . 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 Squamous cell carcinoma of esophagus ([place]) 04/21/2016    Past Surgical History:  Procedure Laterality Date  . BIOPSY  04/14/2016   Procedure: BIOPSY;  Surgeon: Rogene Houston, MD;  [facility];  Service: Endoscopy;;  esophagus  . COLONOSCOPY N/A 04/14/2016   Procedure: COLONOSCOPY;  Surgeon: Rogene Houston, MD;  [facility];  Service: Endoscopy;  Laterality: N/A;  . ESOPHAGOGASTRODUODENOSCOPY N/A 04/14/2016   Procedure: ESOPHAGOGASTRODUODENOSCOPY (EGD);  Surgeon: Rogene Houston, MD;  Location: [facility];  Service: Endoscopy;  Laterality: N/A;  1:00  . NO PAST SURGERIES    . PORTACATH PLACEMENT Right 05/31/2016   Procedure: INSERTION OF TUNNELED RIGHT INTERNAL JUGULAR BARD POWERPORT CENTRAL VENOUS CATHETER WITH SUBCUTANEOUS PORT;  Surgeon: Vickie Epley, MD;  Location: [facility] ORS;  Service: Vascular;  Laterality: Right;  . RADIOACTIVE SEED IMPLANT N/A 09/20/2014   Procedure: RADIOACTIVE SEED IMPLANT;  Surgeon: Franchot Gallo, MD;  Location: [facility];  Service: Urology;  Laterality: N/A;    77   seeds implanted no seeds found in bladder  . TRANSURETHRAL RESECTION OF PROSTATE      Family History  Problem Relation Age of Onset  . Alcohol abuse Mother   . Diabetes Sister     Social History  Social History  . Marital status: Single    Spouse name: N/A  . Number of children: N/A  . Years of education: N/A   Social History Main Topics  . Smoking status: Current Every Day Smoker    Packs/day: 0.10    Years: 20.00    Types: Cigarettes  . Smokeless tobacco: Former Systems developer    Types: [place] date: 05/27/1984     Comment: smokes 2 cigarettes a day  . Alcohol use No     Comment: quit drinking in October 2015. He says before this he says he stayed drunk every day.   . Drug use: No     Comment: last use october 2015  . Sexual activity: No   Other Topics Concern  . None   Social History Narrative  . None     PHYSICAL EXAMINATION  ECOG PERFORMANCE STATUS: 1 - Symptomatic but completely ambulatory  There were no vitals filed for this visit.  Vitals - 1 value per visit [id number]  SYSTOLIC [id number]  DIASTOLIC 67  Pulse [id number]  Temperature 98.5  Respirations 18  Weight (lb) 140.2    GENERAL:alert, no distress, well nourished, well developed, comfortable, cooperative, smiling  and unaccompanied, in chemo-recliner. SKIN: skin color, texture, turgor are normal.   Psoriasis outbreak on extensor surfaces of joint. HEAD: Normocephalic, No masses, lesions, tenderness or abnormalities EYES: normal, EOMI, Conjunctiva are pink and non-injected EARS: External ears normal OROPHARYNX:lips, buccal mucosa, and tongue normal and mucous membranes are moist  NECK: supple, no adenopathy, trachea midline LYMPH:  no palpable lymphadenopathy BREAST:not examined LUNGS: clear to auscultation  HEART: regular rate & rhythm, no murmurs and no gallops ABDOMEN:abdomen soft and normal bowel sounds BACK: Back symmetric, no curvature. EXTREMITIES:less then 2 second capillary refill, no joint deformities, effusion, or inflammation, no skin discoloration, no cyanosis  NEURO: alert & oriented x 3 with fluent speech, no focal motor/sensory deficits, gait normal   LABORATORY DATA: CBC    Component Value Date/Time   WBC 4.3 07/15/2016 0933   RBC 3.63 (L) 07/15/2016 0933   HGB 10.9 (L) 07/15/2016 0933   HCT 31.2 (L) 07/15/2016 0933   PLT 112 (L) 07/15/2016 0933   MCV 86.0 07/15/2016 0933   MCH 30.0 07/15/2016 0933   MCHC 34.9 07/15/2016 0933   RDW 15.6 (H) 07/15/2016 0933   LYMPHSABS 0.5 (L) 07/15/2016 0933   MONOABS 0.4 07/15/2016 0933   EOSABS 0.0 07/15/2016 0933   BASOSABS 0.0 07/15/2016 0933      Chemistry      Component Value Date/Time   NA 134 (L) 07/15/2016 0933   K 3.7 07/15/2016 0933   CL 99 (L) 07/15/2016 0933   CO2 27 07/15/2016 0933   BUN 18 07/15/2016 0933   CREATININE 0.84 07/15/2016 0933   CREATININE 1.01 09/24/2015 0909      Component Value Date/Time   CALCIUM 9.2 07/15/2016 0933   ALKPHOS 62 07/15/2016 0933   AST 16 07/15/2016 0933   ALT 24 07/15/2016 0933   BILITOT 0.5 07/15/2016 0933        PENDING LABS:   RADIOGRAPHIC STUDIES:  No results found.   PATHOLOGY:    NOTES:            ASSESSMENT AND PLAN:  Squamous cell carcinoma  of esophagus ([facility]) Invasive squamous cell of esophagus (TXcN1cM0) with a PET positive upper gastrohepatic ligament.  EUS felt not to be beneficial.  S/P concomitant chemoXRT with weekly Carboplatin/Paclitaxel 06/02/2016- 07/14/2016.  Treatment tolerated very well.  Oncology history updated.  Labs today: CBC diff, CMET.  I personally reviewed and went over laboratory results with the patient.  The results are noted within this dictation.    IVF today.  We had a long discussion.  Patient appears to be maintaining good hydration status.  He wants to stop IVF at this time and this is certainly reasonable.  His transportation difficulties played a part in this patient decision as well.  Weekly labs: CBC diff, BMET.  If renal function change is noted, he will be re-called for IVF.  He reports nausea with vomiting x past 72 hours.  He is seen eating peanut butter crackers in the chemo-area.  He denies any nausea/vomiting currently.  He admits that he DID NOT take his home antiemetics.  He is given education regarding Zofran/Compazine.  He is given a nausea sheet for patient education.  He is advised to call with any nausea/vomiting refractory to home antiemetic regimen.  He is having psoriasis outbreaks on the extensor surfaces of his joints.  He has a long-standing history of psoriasis.  He is using triamcinolone cream at home.  He knows that it is worse since chemotherapy.  He will continue with current regimen and will follow moving forward.  He reports chest pain with food consumption. The patient's provide some education regarding radiation-induced esophagitis.  He reports that soft foods do not induce pain.  He has pain medication at home but has not used it.  He reports that his pain has not been bad enough to need his pain medication.  RD is following patient along.  He will be referred to [facility].  He saw Dr. Servando Snare on 07/02/2016 for thoracic consultation for  consideration of surgical resection following completion of chemoXRT.  His note is reviewed in detail: Patient is a 61 year old male with distal esophageal cancer squamous cell, currently undergoing chemotherapy and radiation treatment. He does have area on PET scan soon chest above noted metastasis in the vicinity of the GE junction, but no evidence of distal metastasis. I discussed with the patient the pros and cons of proceeding with surgical resection of the esophagus after completion of radiation and chemotherapy. The patient is unsure if he would consider surgery but is willing to return several weeks after completion of his radiation therapy with a restaging CT scan of the chest and abdomen and further discussion about the pros and cons of surgical resection after the completion of neoadjuvant therapy.   He is provided education regarding tri-modality management of this cancer versus bi-modality management. He is educated that tri-modality management improves cure/survival compared to the bimodality treatment.  He is hesitant about surgery but is willing to learn more in the future following completion of chemotherapy and radiation.  I have sent Dr. Servando Snare a message regarding response evaluation imaging.  I am not sure the utility of PET to evaluate previously identified hypermetabolic lymph node in the gastrohepatic region and the role that this lymph node may play from a surgical perspective.  If relevant, then PET imaging may be the best restaging study.  If not important, then CT imaging would be most appropriate (and would save the patient a trip to [facility] given his transportation situation).  Return in 4 weeks for follow-up.  Addendum: Based upon discussion with Dr. Servando Snare, we will proceed with PET imaging in [place].  Order is placed.  Dr. Servando Snare will plan on seeing him back following PET scan.  ORDERS PLACED FOR THIS ENCOUNTER: Orders Placed This Encounter  Procedures  . NM PET Image  Restag (PS) Skull Base To Thigh  . CBC with Differential  . Basic metabolic panel  . Ambulatory referral to Speech Therapy  . Ambulatory referral to Occupational Therapy    MEDICATIONS PRESCRIBED THIS ENCOUNTER: No orders of the defined types were placed in this encounter.   THERAPY PLAN:  Restaging PET scan with ongoing IVF.  All questions were answered. The patient knows to call the clinic with any problems, questions or concerns. We can certainly see the patient much sooner if necessary.  Patient and plan discussed with Dr. Twana First and she is in agreement with the aforementioned.   This note is electronically signed by: Doy Mince 07/21/2016 4:26 PM

## 2016-07-21 ENCOUNTER — Encounter: Payer: Medicaid Other | Admitting: Cardiothoracic Surgery

## 2016-07-23 ENCOUNTER — Ambulatory Visit (HOSPITAL_COMMUNITY): Payer: Medicaid Other

## 2016-07-23 ENCOUNTER — Encounter (HOSPITAL_COMMUNITY): Payer: Medicaid Other | Attending: Oncology

## 2016-07-23 DIAGNOSIS — C159 Malignant neoplasm of esophagus, unspecified: Secondary | ICD-10-CM | POA: Insufficient documentation

## 2016-07-23 NOTE — Progress Notes (Signed)
Nutrition Follow-up:  Patient returns to the clinic today for nutrition follow-up. Had planned to see him during scheduled IV fluids but not receiving those today.  Noted patient has completed chemo and radiation therapy.  Surgery still to be determined.  Patient reports that he feels like appetite has improved and he is eating more.  Reports that he is drinking 3 ensure plus per day. Later tells me that he does not really like them.  Addressed him drinking 3 per day and he says he drinks them in a couple big swallows to get them down.  Reports intake for breakfast was 2  Eggs and biscuit this am. Reports lunch yesterday was peanut butter and jelly sandwich and for dinner last night ate chicken kabob with onions, peppers and had oatmeal cake and doughnut.   Reports that he has not had any nausea, noted nausea/vomiting prior to clinic visit on 2/27.  Patient verbalized which medicines to take and how often to me during visit.    Reports that he does not have much pain in chest area when eating. Reports he does not have enough to take pain medication.  Reports not able to have bowel movement this am and afraid he may be getting constipated.    Medications: reviewed  Labs: reviewed  Anthropometrics:   Noted weight on 2/27 was 140 pounds, decreased from previous weight of 144 pounds on 2/23.    Patient reports weight was 130 pounds from 2008-2015 during time he was drinking [place] and he did not have any energy.  Patient states, "he wants to improve his nutrition."   NUTRITION DIAGNOSIS: Unintentional weight loss continues   MALNUTRITION DIAGNOSIS: will continue to monitor   INTERVENTION:   Reviewed soft, moist foods high in calories and protein.   Encouraged intake of 3-4 ensure plus per day.  Discussed ways to increase calories and protein and change taste of ensure plus.   Discussed ways to prevent constipation.  Patient verbalized understanding.  Instructed patient to call clinic if  unable to have bowel movement after trying nutrition strategies.    MONITORING, EVALUATION, GOAL: Patient will consume adequate calories and protein to maintain weight and prevent further weight loss.   NEXT VISIT:  March 13th during multidisciplinary clinic by Burtis Junes, RD  Ernesto Rutherford. Zenia Resides, [place], [place] Registered Dietitian [phone] (pager)

## 2016-07-27 ENCOUNTER — Encounter (HOSPITAL_COMMUNITY): Payer: Medicaid Other

## 2016-07-27 DIAGNOSIS — C159 Malignant neoplasm of esophagus, unspecified: Secondary | ICD-10-CM

## 2016-07-27 LAB — CBC WITH DIFFERENTIAL/PLATELET
BASOS ABS: 0 10*3/uL (ref 0.0–0.1)
Basophils Relative: 0 %
EOS ABS: 0.1 10*3/uL (ref 0.0–0.7)
Eosinophils Relative: 2 %
HCT: 29 % — ABNORMAL LOW (ref 39.0–52.0)
HEMOGLOBIN: 10 g/dL — AB (ref 13.0–17.0)
LYMPHS ABS: 0.4 10*3/uL — AB (ref 0.7–4.0)
Lymphocytes Relative: 16 %
MCH: 30.3 pg (ref 26.0–34.0)
MCHC: 34.5 g/dL (ref 30.0–36.0)
MCV: 87.9 fL (ref 78.0–100.0)
Monocytes Absolute: 0.7 10*3/uL (ref 0.1–1.0)
Monocytes Relative: 26 %
NEUTROS PCT: 56 %
Neutro Abs: 1.4 10*3/uL — ABNORMAL LOW (ref 1.7–7.7)
PLATELETS: 146 10*3/uL — AB (ref 150–400)
RBC: 3.3 MIL/uL — AB (ref 4.22–5.81)
RDW: 16.7 % — ABNORMAL HIGH (ref 11.5–15.5)
WBC: 2.6 10*3/uL — AB (ref 4.0–10.5)

## 2016-07-27 LAB — BASIC METABOLIC PANEL
ANION GAP: 7 (ref 5–15)
BUN: 15 mg/dL (ref 6–20)
CHLORIDE: 102 mmol/L (ref 101–111)
CO2: 27 mmol/L (ref 22–32)
Calcium: 9.1 mg/dL (ref 8.9–10.3)
Creatinine, Ser: 0.88 mg/dL (ref 0.61–1.24)
Glucose, Bld: 100 mg/dL — ABNORMAL HIGH (ref 65–99)
POTASSIUM: 3.6 mmol/L (ref 3.5–5.1)
SODIUM: 136 mmol/L (ref 135–145)

## 2016-08-03 ENCOUNTER — Ambulatory Visit (HOSPITAL_COMMUNITY): Payer: Medicaid Other | Attending: Speech Pathology | Admitting: Speech Pathology

## 2016-08-03 ENCOUNTER — Encounter (HOSPITAL_COMMUNITY): Payer: Medicaid Other | Admitting: Dietician

## 2016-08-03 ENCOUNTER — Ambulatory Visit (HOSPITAL_COMMUNITY): Payer: Medicaid Other

## 2016-08-03 ENCOUNTER — Other Ambulatory Visit (HOSPITAL_COMMUNITY): Payer: Medicaid Other

## 2016-08-10 ENCOUNTER — Encounter (HOSPITAL_COMMUNITY): Payer: Medicaid Other

## 2016-08-10 ENCOUNTER — Telehealth (HOSPITAL_COMMUNITY): Payer: Self-pay | Admitting: Emergency Medicine

## 2016-08-10 DIAGNOSIS — C159 Malignant neoplasm of esophagus, unspecified: Secondary | ICD-10-CM | POA: Diagnosis not present

## 2016-08-10 DIAGNOSIS — C61 Malignant neoplasm of prostate: Secondary | ICD-10-CM

## 2016-08-10 LAB — CBC WITH DIFFERENTIAL/PLATELET
Basophils Absolute: 0 10*3/uL (ref 0.0–0.1)
Basophils Relative: 1 %
EOS ABS: 0.1 10*3/uL (ref 0.0–0.7)
EOS PCT: 1 %
HCT: 29.5 % — ABNORMAL LOW (ref 39.0–52.0)
Hemoglobin: 10.2 g/dL — ABNORMAL LOW (ref 13.0–17.0)
LYMPHS PCT: 9 %
Lymphs Abs: 0.6 10*3/uL — ABNORMAL LOW (ref 0.7–4.0)
MCH: 30.9 pg (ref 26.0–34.0)
MCHC: 34.6 g/dL (ref 30.0–36.0)
MCV: 89.4 fL (ref 78.0–100.0)
Monocytes Absolute: 1.3 10*3/uL — ABNORMAL HIGH (ref 0.1–1.0)
Monocytes Relative: 20 %
Neutro Abs: 4.4 10*3/uL (ref 1.7–7.7)
Neutrophils Relative %: 69 %
Platelets: 216 10*3/uL (ref 150–400)
RBC: 3.3 MIL/uL — AB (ref 4.22–5.81)
RDW: 18.6 % — ABNORMAL HIGH (ref 11.5–15.5)
WBC: 6.4 10*3/uL (ref 4.0–10.5)

## 2016-08-10 LAB — BASIC METABOLIC PANEL
ANION GAP: 7 (ref 5–15)
BUN: 22 mg/dL — ABNORMAL HIGH (ref 6–20)
CO2: 27 mmol/L (ref 22–32)
Calcium: 9 mg/dL (ref 8.9–10.3)
Chloride: 103 mmol/L (ref 101–111)
Creatinine, Ser: 1.08 mg/dL (ref 0.61–1.24)
GFR calc Af Amer: >60 mL/min (ref >60)
Glucose, Bld: 99 mg/dL (ref 65–99)
POTASSIUM: 3.8 mmol/L (ref 3.5–5.1)
SODIUM: 137 mmol/L (ref 135–145)

## 2016-08-10 NOTE — Telephone Encounter (Signed)
Pt is coming to [facility] clinic on 08/31/2016 from 9-11 am.  He understands it is going to be on the second floor.  I have put the referral for OT and speech.

## 2016-08-13 ENCOUNTER — Ambulatory Visit (HOSPITAL_COMMUNITY): Payer: Medicaid Other

## 2016-08-16 ENCOUNTER — Telehealth (HOSPITAL_COMMUNITY): Payer: Self-pay | Admitting: Oncology

## 2016-08-16 NOTE — Telephone Encounter (Signed)
Peer-to-peer completed for restaging PET.  Review her was informed of the patient's hypermetabolic upper gastrohepatic ligament lymph node that was identified at time of diagnosis, but not biopsy-proven.  Given that the patient may be a salvage surgical candidate, importance of determining response to therapy in this lymph node is crucial.  PET scan is approved.  Approval code is: [id number].  Robynn Pane, PA-C 08/16/2016 3:09 PM

## 2016-08-17 ENCOUNTER — Ambulatory Visit: Payer: Medicaid Other | Admitting: Cardiothoracic Surgery

## 2016-08-17 ENCOUNTER — Ambulatory Visit (HOSPITAL_COMMUNITY): Payer: Medicaid Other

## 2016-08-17 ENCOUNTER — Other Ambulatory Visit (HOSPITAL_COMMUNITY): Payer: Medicaid Other

## 2016-08-20 ENCOUNTER — Ambulatory Visit (HOSPITAL_COMMUNITY)
Admission: RE | Admit: 2016-08-20 | Discharge: 2016-08-20 | Disposition: A | Discharge status: Home / Self Care | Payer: Medicaid Other | Source: Ambulatory Visit | Attending: Oncology | Admitting: Oncology

## 2016-08-20 DIAGNOSIS — C159 Malignant neoplasm of esophagus, unspecified: Secondary | ICD-10-CM

## 2016-08-20 DIAGNOSIS — I2510 Atherosclerotic heart disease of native coronary artery without angina pectoris: Secondary | ICD-10-CM | POA: Insufficient documentation

## 2016-08-20 DIAGNOSIS — J8410 Pulmonary fibrosis, unspecified: Secondary | ICD-10-CM | POA: Insufficient documentation

## 2016-08-20 LAB — GLUCOSE, CAPILLARY: Glucose-Capillary: 84 mg/dL (ref 65–99)

## 2016-08-24 ENCOUNTER — Encounter: Payer: Self-pay | Admitting: Cardiothoracic Surgery

## 2016-08-24 ENCOUNTER — Ambulatory Visit (INDEPENDENT_AMBULATORY_CARE_PROVIDER_SITE_OTHER): Payer: Medicaid Other | Admitting: Cardiothoracic Surgery

## 2016-08-24 VITALS — BP 117/69 | Resp 20 | Ht 70.0 in | Wt 148.0 lb

## 2016-08-24 DIAGNOSIS — Z8546 Personal history of malignant neoplasm of prostate: Secondary | ICD-10-CM

## 2016-08-24 DIAGNOSIS — C160 Malignant neoplasm of cardia: Secondary | ICD-10-CM

## 2016-08-24 NOTE — Progress Notes (Signed)
[address]       [place] [address]             [id number]                    Stanley Jimenez [facility] Medical Record #[id number] Date of Birth: [birth year]  Referring: Baird Cancer, PA-C Primary Care: [name], MD  Chief Complaint:    Chief Complaint  Patient presents with  . Esophageal Cancer    f/u to discuss PET Scan 08/20/16    History of Present Illness:    Stanley Jimenez 61 y.o. male is seen in the office  today To discuss resection of esophageal cancer . 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   He has completed Concomitant chemoXRT with Carboplatin/Paclitaxel weekly beginning on 06/02/2016, finished radiation 07/14/2016. Since completing treatment he notes his swallowing is much improved, now on a regular diet, is gained 5 pounds. He notes through the winter he is heated his house with wood. He cares for 2 pets and lives alone. He is very worried about having to go to a nursing home after surgery.  He continues to be a daily smoker but has cut back from 1 pack a day to several cigarettes a day. He stopped total consumption of alcohol 3 years ago.  Patient's had a follow-up PET scan done 2 days ago Cancer Staging Squamous cell carcinoma of esophagus ([place]) Staging form: Esophagus - Squamous Cell Carcinoma, AJCC 8th Edition - Clinical stage from 06/15/2016: Stage Unknown (cTX, cN1, cM0, L: Lower) - Signed by Baird Cancer, PA-C on 06/15/2016  Current Activity/ Functional Status:  Patient is independent with mobility/ambulation, transfers, ADL's, IADL's.   Zubrod Score: At the time of surgery this patient's most appropriate activity status/level should be described as: []     0    Normal activity, no symptoms [x]     1    Restricted in physical strenuous activity but ambulatory, able to do out light work []     2    Ambulatory and capable of self care, unable to do work  activities, up and about               >50 % of waking hours                              []     3    Only limited self care, in bed greater than 50% of waking hours []     4    Completely disabled, no self care, confined to bed or chair []     5    Moribund   Past Medical History:  Diagnosis Date  . 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 Anxiety   . Arthritis   . Cirrhosis, alcoholic ([place])   . Clubbing of fingers    congenital  . Depression   . Full dentures   . History of PSVT (paroxysmal supraventricular tachycardia)    run of non-sustatined VT 03-10-2015 in setting of alcohol withdrawal in hospital  . History of seizure    03-08-2012  alcohol withdrawal  . History of thrombocytopenia    10/ 2013  in setting of alcohol withdrawal  . Hyperlipidemia   . Hypertension   . Prostate cancer [facility]) urologist-  dr dalhstedt/  oncologist- dr Tammi Klippel  T1c, Gleason 3+3,  PSA 8.89,  vol 27cc  . 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 Squamous cell carcinoma of esophagus ([place]) 04/21/2016    Past Surgical History:  Procedure Laterality Date  . BIOPSY  04/14/2016   Procedure: BIOPSY;  Surgeon: Rogene Houston, MD;  [facility];  Service: Endoscopy;;  esophagus  . COLONOSCOPY N/A 04/14/2016   Procedure: COLONOSCOPY;  Surgeon: Rogene Houston, MD;  [facility];  Service: Endoscopy;  Laterality: N/A;  . ESOPHAGOGASTRODUODENOSCOPY N/A 04/14/2016   Procedure: ESOPHAGOGASTRODUODENOSCOPY (EGD);  Surgeon: Rogene Houston, MD;  [facility];  Service: Endoscopy;  Laterality: N/A;  1:00  . NO PAST SURGERIES    . PORTACATH PLACEMENT Right 05/31/2016   Procedure: INSERTION OF TUNNELED RIGHT INTERNAL JUGULAR BARD POWERPORT CENTRAL VENOUS CATHETER WITH SUBCUTANEOUS PORT;  Surgeon: Vickie Epley, MD;  Location: [facility];  Service: Vascular;  Laterality: Right;  . RADIOACTIVE SEED  IMPLANT N/A 09/20/2014   Procedure: RADIOACTIVE SEED IMPLANT;  Surgeon: Franchot Gallo, MD;  [facility];  Service: Urology;  Laterality: N/A;    77   seeds implanted no seeds found in bladder  . TRANSURETHRAL RESECTION OF PROSTATE      Family History  Problem Relation Age of Onset  . Alcohol abuse Mother   . Diabetes Sister     Social History   Social History  . Marital status: Single    Spouse name: N/A  . Number of children: N/A  . Years of education: N/A   Occupational History  . Not on file.   Social History Main Topics  . Smoking status: Current Every Day Smoker    Packs/day: 0.10    Years: 20.00    Types: Cigarettes  . Smokeless tobacco: Former Systems developer    Types: [place] date: 05/27/1984     Comment: smokes 2 cigarettes a day  . Alcohol use No     Comment: quit drinking in October 2015. He says before this he says he stayed drunk every day.   . Drug use: No     Comment: last use october 2015  . Sexual activity: No   Other Topics Concern  . Not on file   Social History Narrative  . No narrative on file    History  Smoking Status  . Current Every Day Smoker  . Packs/day: 0.10  . Years: 20.00  . Types: Cigarettes  Smokeless Tobacco  . Former Systems developer  . Types: Chew  . Quit date: 05/27/1984    Comment: smokes 2 cigarettes a day    History  Alcohol Use No    Comment: quit drinking in October 2015. He says before this he says he stayed drunk every day.      No Known Allergies  Current Outpatient Prescriptions  Medication Sig Dispense Refill  . amLODipine (NORVASC) 10 MG tablet TAKE 1 TABLET BY MOUTH EVERY DAY IN THE MORNING FOR HIGH BLOOD PRESSURE 30 tablet 6  . CARBOPLATIN IV Inject into the vein.    . diphenhydramine-acetaminophen (TYLENOL PM) 25-500 MG TABS tablet Take 1 tablet by mouth at bedtime as needed.    Marland Kitchen HYDROcodone-acetaminophen (NORCO/VICODIN) 5-325 MG tablet Take 0.5-1 tablets by mouth every 6 (six) hours as  needed for moderate pain. 30 tablet 0  . lidocaine-prilocaine (EMLA) cream Apply a quarter size amount to  affected area 1 hour prior to coming to chemotherapy. Cover with plastic. 30 g 2  . ondansetron (ZOFRAN) 8 MG tablet Take 1 tablet (8 mg total) by mouth every 8 (eight) hours as needed for nausea or vomiting. 30 tablet 2  . PACLitaxel (TAXOL IV) Inject into the vein.    . pantoprazole (PROTONIX) 40 MG tablet Take 1 tablet (40 mg total) by mouth daily before breakfast. 30 tablet 5  . prochlorperazine (COMPAZINE) 10 MG tablet Take 1 tablet (10 mg total) by mouth every 6 (six) hours as needed for nausea or vomiting. 30 tablet 2  . tamsulosin (FLOMAX) 0.4 MG CAPS capsule Take 1 capsule (0.4 mg total) by mouth daily after supper. 30 capsule 5   No current facility-administered medications for this visit.      Review of Systems:     Cardiac Review of Systems: Y or N  Chest Pain [  n  ]  Resting SOB [ n  ] Exertional SOB  [ y ]  Orthopnea [ n ]   Pedal Edema [ n  ]    Palpitations [ n ] Syncope  [ n ]   Presyncope [n   ]  General Review of Systems: [Y] = yes [  ]=no Constitional: recent weight change Blue.Reese  ];  Wt loss over the last 3 months [ 8 lbs  ] anorexia Blue.Reese  ]; fatigue Blue.Reese  ]; nausea [  ]; night sweats [  ]; fever [  ]; or chills [  ];          Dental: poor dentition[  ]; Last Dentist visit:   Eye : blurred vision [  ]; diplopia [   ]; vision changes [  ];  Amaurosis fugax[  ]; Resp: cough [  ];  wheezing[  ];  hemoptysis[  ]; shortness of breath[  ]; paroxysmal nocturnal dyspnea[ n ]; dyspnea on exertion[  ]; or orthopnea[  ];  GI:  gallstones[  ], vomiting[  ];  dysphagia[  ]; melena[  ];  hematochezia [  ]; heartburn[  ];   Hx of  Colonoscopy[  ]; GU: kidney stones [  ]; hematuria[n  ];   dysuria [  ];  nocturia[  ];  history of     obstruction [  ]; urinary frequency [  ]             Skin: rash, swelling[  ];, hair loss[  ];  peripheral edema[  ];  or itching[  ]; Musculosketetal:  myalgias[  ];  joint swelling[  ];  joint erythema[  ];  joint pain[  ];  back pain[  ];  Heme/Lymph: bruising[  ];  bleeding[  ];  anemia[  ];  Neuro: TIA[ n ];  headaches[  ];  stroke[  ];  vertigo[  ];  seizures[  ];   paresthesias[  ];  difficulty walking[ n ];  Psych:depression[  ]; anxiety[  ];  Endocrine: diabetes[  ];  thyroid dysfunction[  ];  Immunizations: Flu up to date [ n ]; Pneumococcal up to date [ n ];  Other:  Physical Exam: BP 117/69 (BP Location: Right Arm, Patient Position: Sitting, Cuff Size: Normal)   Resp 20   Ht 5\' 10"  (1.778 m)   Wt 148 lb (67.1 kg)   SpO2 99%   BMI 21.24 kg/m   PHYSICAL EXAMINATION: General appearance: alert, cooperative, appears stated age and no distress Head: Normocephalic, without obvious abnormality, atraumatic  Neck: no adenopathy, no carotid bruit, no JVD, supple, symmetrical, trachea midline and thyroid not enlarged, symmetric, no tenderness/mass/nodules Lymph nodes: Cervical, supraclavicular, and axillary nodes normal. Resp: clear to auscultation bilaterally Back: symmetric, no curvature. ROM normal. No CVA tenderness. Cardio: regular rate and rhythm, S1, S2 normal, no murmur, click, rub or gallop GI: soft, non-tender; bowel sounds normal; no masses,  no organomegaly Extremities: extremities normal, atraumatic, no cyanosis or edema, Homans sign is negative, no sign of DVT and Clubbing of his fingers is noted Neurologic: Grossly normal Patient has a right sided port in place    Diagnostic Studies & Laboratory data:     Recent Radiology Findings:   Study Result    Study Result  Nm Pet Image Restag (ps) Skull Base To Thigh  Result Date: 08/23/2016 CLINICAL DATA:  Subsequent treatment strategy for squamous cell carcinoma of the lower thoracic esophagus diagnosed November 2017 status post concurrent chemoradiation therapy completed 07/14/2016. Additional history of prostate cancer. EXAM: NUCLEAR MEDICINE PET SKULL BASE TO THIGH  TECHNIQUE: 7.0 mCi F-18 FDG was injected intravenously. Full-ring PET imaging was performed from the skull base to thigh after the radiotracer. CT data was obtained and used for attenuation correction and anatomic localization. FASTING BLOOD GLUCOSE:  Value: 84 mg/dl COMPARISON:  05/07/2016 PET-CT. FINDINGS: NECK No hypermetabolic lymph nodes in the neck. CHEST No residual hypermetabolism or wall thickening in the thoracic esophagus. Right internal jugular MediPort terminates in the right atrium. Left anterior descending, left circumflex and right coronary atherosclerosis. Atherosclerotic nonaneurysmal thoracic aorta. No enlarged or hypermetabolic axillary, mediastinal or hilar lymph nodes. No pleural effusions. No pneumothorax. No acute consolidative airspace disease, lung masses or significant pulmonary nodules. Stable calcified 3 mm granuloma in the anterior left lower lobe. ABDOMEN/PELVIS No enlarged or hypermetabolic lymph nodes in the abdomen or pelvis. No residual hypermetabolism within the previously visualized hypermetabolic 1.1 cm upper gastrohepatic ligament lymph node, which is decreased in size to 0.5 cm (series 4/image 89). No abnormal hypermetabolic activity within the liver, pancreas, adrenal glands, or spleen. No hypermetabolic lymph nodes in the abdomen or pelvis. Subcentimeter hypodense segment 7 right liver lobe lesion is non hypermetabolic and stable in size since 04/26/2016, considered benign. Atherosclerotic nonaneurysmal abdominal aorta. Brachytherapy seeds are again noted within the prostate. SKELETON No focal hypermetabolic activity to suggest skeletal metastasis. IMPRESSION: 1. Complete metabolic response. No residual hypermetabolism in the thoracic esophagus or in the upper gastrohepatic ligament lymph node, which is decreased in size. 2. Aortic atherosclerosis.  Three-vessel coronary atherosclerosis. Electronically Signed   By: Ilona Sorrel M.D.   On: 08/23/2016 09:54    CLINICAL  DATA:  61 year old male with history of esophageal cancer. Followup study. Prior history of prostate cancer diagnosed 2 years ago, status post surgery.  EXAM: CT CHEST, ABDOMEN, AND PELVIS WITH CONTRAST  TECHNIQUE: Multidetector CT imaging of the chest, abdomen and pelvis was performed following the standard protocol during bolus administration of intravenous contrast.  CONTRAST:  12mL ISOVUE-300 IOPAMIDOL (ISOVUE-300) INJECTION 61%  COMPARISON:  No priors.  FINDINGS: CT CHEST FINDINGS  Cardiovascular: Heart size is normal. There is no significant pericardial fluid, thickening or pericardial calcification. There is aortic atherosclerosis, as well as atherosclerosis of the great vessels of the mediastinum and the coronary arteries, including calcified atherosclerotic plaque in the left main, left anterior descending, left circumflex and right coronary arteries.  Mediastinum/Nodes: No pathologically enlarged mediastinal or hilar lymph nodes. There is some amorphous thickening of the gastroesophageal junction, but no discrete esophageal  mass is confidently identified by CT examination. No axillary lymphadenopathy.  Lungs/Pleura: No suspicious appearing pulmonary nodules or masses. No acute consolidative airspace disease. No pleural effusions.  Musculoskeletal: There are no aggressive appearing lytic or blastic lesions noted in the visualized portions of the skeleton.  CT ABDOMEN PELVIS FINDINGS  Hepatobiliary: There is a cluster of sub cm low-attenuation lesions in the segment 7 of the liver which are too small to characterize on today's examination. No larger more suspicious appearing hepatic lesions are noted. No intra or extrahepatic biliary ductal dilatation. Gallbladder is normal in appearance.  Pancreas: No pancreatic mass. No pancreatic ductal dilatation. No pancreatic or peripancreatic fluid or inflammatory changes.  Spleen:  Unremarkable.  Adrenals/Urinary Tract: Several sub cm low-attenuation lesions are noted in the kidneys bilaterally, too small to definitively characterize, but statistically likely to represent tiny cysts. Bilateral adrenal glands are normal in appearance. No hydroureteronephrosis. Urinary bladder is normal in appearance.  Stomach/Bowel: Thickening of the gastroesophageal junction is noted, without a discrete mass. Remainder of the stomach is otherwise normal in appearance. No pathologic dilatation of small bowel or colon. Normal appendix.  Vascular/Lymphatic: Aortic atherosclerosis, without evidence of aneurysm or dissection in the abdominal or pelvic vasculature. There is a prominent borderline enlarged lymph node in the gastrohepatic ligament (image 53 of series 2) adjacent to the prominent gastroesophageal junction, which is nonspecific, but concerning for potential nodal disease. No other lymphadenopathy is confidently identified elsewhere in the abdomen or pelvis.  Reproductive: Brachytherapy implants throughout the prostate gland. Seminal vesicles are unremarkable in appearance.  Other: No significant volume of ascites.  No pneumoperitoneum.  Musculoskeletal: There are no aggressive appearing lytic or blastic lesions noted in the visualized portions of the skeleton.  IMPRESSION: 1. Prominent gastroesophageal junction without discrete esophageal mass. Notably, there is also a borderline enlarged enhancing lymph node in the gastrohepatic ligament immediately adjacent to the gastroesophageal junction, which is nonspecific, but could indicate early nodal disease. 2. The small cluster of low-attenuation lesions in segment 7 of the liver are too small to characterize. Statistically, these are likely to represent cysts. If there is clinical concern for metastatic disease to the liver, these could be further characterized with MRI of the abdomen with and without IV  gadolinium at this time. 3. No other potential sites of metastatic disease noted elsewhere in the chest, abdomen or pelvis. 4. Aortic atherosclerosis, in addition to left main and 3 vessel coronary artery disease. Please note that although the presence of coronary artery calcium documents the presence of coronary artery disease, the severity of this disease and any potential stenosis cannot be assessed on this non-gated CT examination. Assessment for potential risk factor modification, dietary therapy or pharmacologic therapy may be warranted, if clinically indicated. 5. Postprocedural changes of prostate brachytherapy. 6. Additional incidental findings, as above.   Electronically Signed   By: Vinnie Langton M.D.   On: 04/26/2016 12:43       CLINICAL DATA:  Initial treatment strategy for esophageal squamous cell carcinoma.  EXAM: NUCLEAR MEDICINE PET SKULL BASE TO THIGH  TECHNIQUE: 7.1 mCi F-18 FDG was injected intravenously. Full-ring PET imaging was performed from the skull base to thigh after the radiotracer. CT data was obtained and used for attenuation correction and anatomic localization.  FASTING BLOOD GLUCOSE:  Value: 87 mg/dl  COMPARISON:  CT on 04/26/2016  FINDINGS: NECK  No hypermetabolic lymph nodes in the neck.  CHEST  Focal hypermetabolic activity is seen in the lower thoracic esophagus corresponding to  concentric esophageal wall thickening, which has SUV max of 15.3.  No hypermetabolic mediastinal or hilar nodes. No suspicious pulmonary nodules on the CT scan.  Aortic atherosclerosis.   Coronary artery calcification.  ABDOMEN/PELVIS  No abnormal hypermetabolic activity within the liver, pancreas, adrenal glands, or spleen.  11 mm lymph node in the upper gastrohepatic ligament along the anterior aspect of the GE junction is hypermetabolic, with SUV max of 7.2. No other hypermetabolic lymph nodes identified within abdomen or  pelvis.  Tiny sub-cm low-attenuation lesions in the posterior right hepatic lobe show no corresponding hypermetabolic activity. Aortic atherosclerosis. Prostate brachytherapy seeds again noted.  SKELETON  No focal hypermetabolic activity to suggest skeletal metastasis.  IMPRESSION: Hypermetabolic bilateral lower thoracic esophageal mass, consistent with known primary esophageal carcinoma.  Small hypermetabolic lymph node in the upper gastrohepatic ligament adjacent to GE junction, consistent with lymph node metastasis.  No other sites of metastatic disease identified.   Electronically Signed   By: Earle Gell M.D.   On: 05/07/2016 17:01     I have independently reviewed the above radiologic studies.  Recent Lab Findings: Lab Results  Component Value Date   WBC 6.4 08/10/2016   HGB 10.2 (L) 08/10/2016   HCT 29.5 (L) 08/10/2016   PLT 216 08/10/2016   GLUCOSE 99 08/10/2016   CHOL 181 09/24/2015   TRIG 100 09/24/2015   HDL 60 09/24/2015   LDLCALC 101 09/24/2015   ALT 24 07/15/2016   AST 16 07/15/2016   NA 137 08/10/2016   K 3.8 08/10/2016   CL 103 08/10/2016   CREATININE 1.08 08/10/2016   BUN 22 (H) 08/10/2016   CO2 27 08/10/2016   TSH 0.83 09/24/2015   INR 1.05 09/13/2014   HGBA1C 5.9 (H) 01/17/2014   Findings:      The proximal esophagus was normal.      One cratered esophageal ulcer with no bleeding and no stigmata of recent       bleeding was found 33 to 36 cm from the incisors. The lesion was 30 mm       in largest dimension. Biopsies were taken with a cold forceps for       histology.      The distal esophagus was normal.      The Z-line was regular and was found 39 cm from the incisors.      A 2 cm hiatal hernia was present.      The entire examined stomach was normal.      The duodenal bulb and second portion of the duodenum were normal. Impression:               - Normal proximal esophagus.                           - Non-bleeding  esophageal ulcer. Endoscopic                            appearance consistent with malignant ulcer possibly                            squamous cell carcinoma. Biopsied.                           - Normal distal 3 cm of esophageal mucosa distal to  lesion.                           - Z-line regular, 39 cm from the incisors.                           - 2 cm hiatal hernia.                           - Normal stomach.                           - Normal duodenal bulb and second portion of the                            duodenum.   Assessment / Plan:  Patient is a 61 year old male with distal esophageal cancer squamous cell,  Advanced stage squamous cell carcinoma the distal esophagus with good response to chemotherapy and radiation so far I discussed with the patient in detail the option of proceeding with surgical resection of this esophagus possibly transhiatal. I've recommended to him that surgical resection offers the best chance of cure, if he waits and there is recurrent disease remaining no longer be a surgical candidate. He wants to talk to [facility] at the cancer center tomorrow about surgery , from his discussion today I think he is not inclined to agree but it has been offered .     Grace Isaac MD      [address] [place] [address] Office [phone]   Beeper [phone]  08/24/2016 2:42 PM

## 2016-08-25 ENCOUNTER — Encounter (HOSPITAL_BASED_OUTPATIENT_CLINIC_OR_DEPARTMENT_OTHER): Payer: Medicaid Other | Admitting: Oncology

## 2016-08-25 ENCOUNTER — Encounter (HOSPITAL_COMMUNITY): Payer: Medicaid Other | Attending: Oncology

## 2016-08-25 ENCOUNTER — Encounter (HOSPITAL_COMMUNITY): Payer: Self-pay

## 2016-08-25 VITALS — BP 123/68 | HR 85 | Temp 97.9°F | Resp 20 | Wt 148.0 lb

## 2016-08-25 DIAGNOSIS — Z720 Tobacco use: Secondary | ICD-10-CM

## 2016-08-25 DIAGNOSIS — I700 Atherosclerosis of aorta: Secondary | ICD-10-CM | POA: Insufficient documentation

## 2016-08-25 DIAGNOSIS — F17210 Nicotine dependence, cigarettes, uncomplicated: Secondary | ICD-10-CM | POA: Diagnosis not present

## 2016-08-25 DIAGNOSIS — I471 Supraventricular tachycardia: Secondary | ICD-10-CM | POA: Diagnosis not present

## 2016-08-25 DIAGNOSIS — Z8546 Personal history of malignant neoplasm of prostate: Secondary | ICD-10-CM

## 2016-08-25 DIAGNOSIS — F1011 Alcohol abuse, in remission: Secondary | ICD-10-CM | POA: Diagnosis not present

## 2016-08-25 DIAGNOSIS — F329 Major depressive disorder, single episode, unspecified: Secondary | ICD-10-CM | POA: Diagnosis not present

## 2016-08-25 DIAGNOSIS — C159 Malignant neoplasm of esophagus, unspecified: Secondary | ICD-10-CM | POA: Insufficient documentation

## 2016-08-25 DIAGNOSIS — E785 Hyperlipidemia, unspecified: Secondary | ICD-10-CM | POA: Diagnosis not present

## 2016-08-25 DIAGNOSIS — I10 Essential (primary) hypertension: Secondary | ICD-10-CM | POA: Diagnosis not present

## 2016-08-25 DIAGNOSIS — I2510 Atherosclerotic heart disease of native coronary artery without angina pectoris: Secondary | ICD-10-CM | POA: Diagnosis not present

## 2016-08-25 DIAGNOSIS — F419 Anxiety disorder, unspecified: Secondary | ICD-10-CM | POA: Insufficient documentation

## 2016-08-25 LAB — CBC WITH DIFFERENTIAL/PLATELET
BASOS PCT: 1 %
Basophils Absolute: 0.1 10*3/uL (ref 0.0–0.1)
EOS ABS: 0.1 10*3/uL (ref 0.0–0.7)
Eosinophils Relative: 2 %
HCT: 32.4 % — ABNORMAL LOW (ref 39.0–52.0)
HEMOGLOBIN: 10.8 g/dL — AB (ref 13.0–17.0)
Lymphocytes Relative: 14 %
Lymphs Abs: 1 10*3/uL (ref 0.7–4.0)
MCH: 30.9 pg (ref 26.0–34.0)
MCHC: 33.3 g/dL (ref 30.0–36.0)
MCV: 92.6 fL (ref 78.0–100.0)
Monocytes Absolute: 1 10*3/uL (ref 0.1–1.0)
Monocytes Relative: 14 %
NEUTROS ABS: 4.9 10*3/uL (ref 1.7–7.7)
NEUTROS PCT: 69 %
Platelets: 198 10*3/uL (ref 150–400)
RBC: 3.5 MIL/uL — AB (ref 4.22–5.81)
RDW: 19.6 % — ABNORMAL HIGH (ref 11.5–15.5)
WBC: 7.1 10*3/uL (ref 4.0–10.5)

## 2016-08-25 LAB — BASIC METABOLIC PANEL
ANION GAP: 9 (ref 5–15)
BUN: 20 mg/dL (ref 6–20)
CHLORIDE: 101 mmol/L (ref 101–111)
CO2: 26 mmol/L (ref 22–32)
Calcium: 9.1 mg/dL (ref 8.9–10.3)
Creatinine, Ser: 1.08 mg/dL (ref 0.61–1.24)
GFR calc non Af Amer: >60 mL/min (ref >60)
Glucose, Bld: 94 mg/dL (ref 65–99)
POTASSIUM: 3.9 mmol/L (ref 3.5–5.1)
SODIUM: 136 mmol/L (ref 135–145)

## 2016-08-25 NOTE — Progress Notes (Signed)
[facility] Hematology/Oncology Progress Note  Name: Stanley Jimenez      MRN: [id number]   Date: 08/25/2016 Time:8:38 AM   REFERRING PHYSICIAN:  Hildred Laser, MD (GI)  REASON FOR CONSULT:  Newly diagnosed poorly differentiated, invasive squamous cell carcinoma   DIAGNOSIS:  Invasive squamous cell carcinoma of esophagus.  HISTORY OF PRESENT ILLNESS:      Malignant neoplasm of prostate ([place])   04/23/2014 Procedure    [place] and prostate biopsy      04/23/2014 Pathology Results    2/12 cores positive for adenocarcinoma.  Gleason 3+3 = 6 pattern.      08/14/2014 Initial Diagnosis    Malignant neoplasm of prostate [facility])      - 09/20/2014 Radiation Therapy    Brachytherapy with I-125       Squamous cell carcinoma of esophagus ([place])   04/14/2016 Procedure    EGD by Dr. Laural Golden.      04/16/2016 Pathology Results    Esophagus, biopsy - INVASIVE POORLY DIFFERENTIATED SQUAMOUS CELL CARCINOMA.      04/26/2016 Imaging    CT CAP- 1. Prominent gastroesophageal junction without discrete esophageal mass. Notably, there is also a borderline enlarged enhancing lymph node in the gastrohepatic ligament immediately adjacent to the gastroesophageal junction, which is nonspecific, but could indicate early nodal disease. 2. The small cluster of low-attenuation lesions in segment 7 of the liver are too small to characterize. Statistically, these are likely to represent cysts. If there is clinical concern for metastatic disease to the liver, these could be further characterized with MRI of the abdomen with and without IV gadolinium at this time. 3. No other potential sites of metastatic disease noted elsewhere in the chest, abdomen or pelvis.      05/10/2016 PET scan    Hypermetabolic bilateral lower thoracic esophageal mass, consistent with known primary esophageal carcinoma.  Small hypermetabolic lymph node in the upper gastrohepatic ligament adjacent to GE junction,  consistent with lymph node metastasis.  No other sites of metastatic disease identified.      05/31/2016 Procedure    Port placed by Dr. Rosana Hoes      06/02/2016 - 07/14/2016 Chemotherapy    Weekly Carboplatin/Paclitaxel with XRT.  Tolerated extremely well.       06/02/2016 - 07/14/2016 Radiation Therapy    [place], [place].  Tolerated well.      08/23/2016 PET scan    1. Complete metabolic response. No residual hypermetabolism in the thoracic esophagus or in the upper gastrohepatic ligament lymph node, which is decreased in size. 2. Aortic atherosclerosis. Three-vessel coronary atherosclerosis.      08/24/2016 Miscellaneous    CTS consult with Dr. Servando Snare- "I've recommended to him that surgical resection offers the best chance of cure, if he waits and there is recurrent disease remaining no longer be a surgical candidate. From his discussion today I think he is not inclined to agree but it has been offered."       Stanley Jimenez is a 61 y.o. male who returns for f/u of poorly differentiated, invasive squamous cell carcinoma.  He is doing well today. Restaging PET demonstrated complete response with no residual disease. He saw his surgeon, Dr. Servando Snare, yesterday, and he recommended surgical resection. Pt is unsure if he wants to have surgery because he believes he would be a burden to his family while he is recovering. Denies loss of appetite, abdominal pain, chest pain, SOB, or any other concerns.  Review of Systems  Constitutional: Negative.        No loss of appetite   HENT: Negative.   Eyes: Negative.   Respiratory: Negative.  Negative for shortness of breath.   Cardiovascular: Negative.  Negative for chest pain.  Gastrointestinal: Negative.  Negative for abdominal pain.  Genitourinary: Negative.   Musculoskeletal: Negative.   Skin: Negative.   Neurological: Negative.   Endo/Heme/Allergies: Negative.   Psychiatric/Behavioral: Negative.   All other systems reviewed and are  negative. 14 point review of systems was performed and is negative except as detailed under history of present illness and above  PAST MEDICAL HISTORY:   Past Medical History:  Diagnosis Date  . 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 Anxiety   . Arthritis   . Cirrhosis, alcoholic ([place])   . Clubbing of fingers    congenital  . Depression   . Full dentures   . History of PSVT (paroxysmal supraventricular tachycardia)    run of non-sustatined VT 03-10-2015 in setting of alcohol withdrawal in hospital  . History of seizure    03-08-2012  alcohol withdrawal  . History of thrombocytopenia    10/ 2013  in setting of alcohol withdrawal  . Hyperlipidemia   . Hypertension   . Prostate cancer [facility]) urologist-  dr dalhstedt/  oncologist- dr Tammi Klippel   T1c, Gleason 3+3,  PSA 8.89,  vol 27cc  . 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 Squamous cell carcinoma of esophagus ([place]) 04/21/2016    ALLERGIES: No Known Allergies    MEDICATIONS: I have reviewed the patient's current medications.    Current Outpatient Prescriptions on File Prior to Visit  Medication Sig Dispense Refill  . amLODipine (NORVASC) 10 MG tablet TAKE 1 TABLET BY MOUTH EVERY DAY IN THE MORNING FOR HIGH BLOOD PRESSURE 30 tablet 6  . CARBOPLATIN IV Inject into the vein.    . diphenhydramine-acetaminophen (TYLENOL PM) 25-500 MG TABS tablet Take 1 tablet by mouth at bedtime as needed.    Marland Kitchen HYDROcodone-acetaminophen (NORCO/VICODIN) 5-325 MG tablet Take 0.5-1 tablets by mouth every 6 (six) hours as needed for moderate pain. 30 tablet 0  . lidocaine-prilocaine (EMLA) cream Apply a quarter size amount to affected area 1 hour prior to coming to chemotherapy. Cover with plastic. 30 g 2  . ondansetron (ZOFRAN) 8 MG tablet Take 1 tablet (8 mg total) by mouth every 8 (eight) hours as needed for nausea or vomiting. 30 tablet 2  . PACLitaxel (TAXOL  IV) Inject into the vein.    . pantoprazole (PROTONIX) 40 MG tablet Take 1 tablet (40 mg total) by mouth daily before breakfast. 30 tablet 5  . prochlorperazine (COMPAZINE) 10 MG tablet Take 1 tablet (10 mg total) by mouth every 6 (six) hours as needed for nausea or vomiting. 30 tablet 2  . tamsulosin (FLOMAX) 0.4 MG CAPS capsule Take 1 capsule (0.4 mg total) by mouth daily after supper. 30 capsule 5   No current facility-administered medications on file prior to visit.      PAST SURGICAL HISTORY Past Surgical History:  Procedure Laterality Date  . BIOPSY  04/14/2016   Procedure: BIOPSY;  Surgeon: Rogene Houston, MD;  [facility];  Service: Endoscopy;;  esophagus  . COLONOSCOPY N/A 04/14/2016   Procedure: COLONOSCOPY;  Surgeon: Rogene Houston, MD;  [facility];  Service: Endoscopy;  Laterality: N/A;  . ESOPHAGOGASTRODUODENOSCOPY N/A 04/14/2016   Procedure: ESOPHAGOGASTRODUODENOSCOPY (EGD);  Surgeon: Rogene Houston, MD;  Location[facility];  Service: Endoscopy;  Laterality: N/A;  1:00  . NO PAST SURGERIES    . PORTACATH PLACEMENT Right 05/31/2016   Procedure: INSERTION OF TUNNELED RIGHT INTERNAL JUGULAR BARD POWERPORT CENTRAL VENOUS CATHETER WITH SUBCUTANEOUS PORT;  Surgeon: Vickie Epley, MD;  Location: [facility];  Service: Vascular;  Laterality: Right;  . RADIOACTIVE SEED IMPLANT N/A 09/20/2014   Procedure: RADIOACTIVE SEED IMPLANT;  Surgeon: Franchot Gallo, MD;  Location: [facility];  Service: Urology;  Laterality: N/A;    77   seeds implanted no seeds found in bladder  . TRANSURETHRAL RESECTION OF PROSTATE      FAMILY HISTORY: Family History  Problem Relation Age of Onset  . Alcohol abuse Mother   . Diabetes Sister    Mother is deceased at the age of 28 due to complications of EtOHism. Father is deceased when the patient was 67 years old from unknown cause. He has 3 brothers and 3 sisters.  All are local except for 2 sisters who live  in [place].   He has no children.   SOCIAL HISTORY: He reports a 1 ppd smoking history x 20+ years.  He quit drinking EtOH 2 years ago, but admits to a 6 year history of drinking daily both beer and liquor.  He does have a history of illicit abuse with Marijuana, this too he quit 2 years ago.  He works part-time as a Arts administrator.  He notes that he is on disability, but diagnosis for disability is unclear.  He is Holiness in religion.  He is never married.  No children.  He did not graduate [employer] and he reads a little, but admits he is not the best reader.  He can write.  Social History   Social History  . Marital status: Single    Spouse name: N/A  . Number of children: N/A  . Years of education: N/A   Social History Main Topics  . Smoking status: Current Every Day Smoker    Packs/day: 0.10    Years: 20.00    Types: Cigarettes  . Smokeless tobacco: Former Systems developer    Types: [place] date: 05/27/1984     Comment: smokes 2 cigarettes a day  . Alcohol use No     Comment: quit drinking in October 2015. He says before this he says he stayed drunk every day.   . Drug use: No     Comment: last use october 2015  . Sexual activity: No   Other Topics Concern  . Not on file   Social History Narrative  . No narrative on file    PERFORMANCE STATUS: The patient's performance status is 1 - Symptomatic but completely ambulatory  PHYSICAL EXAM: Vitals:   08/25/16 0957  BP: 123/68  Pulse: 85  Resp: 20  Temp: 97.9 F (36.6 C)     Physical Exam  Constitutional: He is oriented to person, place, and time and well-developed, well-nourished, and in no distress.  HENT:  Head: Normocephalic and atraumatic.  Mouth/Throat: Oropharynx is clear and moist.  Eyes: Conjunctivae and EOM are normal. Pupils are equal, round, and reactive to light.  Neck: Normal range of motion. Neck supple.  Cardiovascular: Normal rate, regular rhythm and normal heart sounds.   Pulmonary/Chest: Effort  normal and breath sounds normal.  Abdominal: Soft. Bowel sounds are normal.  Musculoskeletal: Normal range of motion.  Neurological: He is alert and oriented to person, place, and time. Gait normal.  Skin: Skin is warm and dry.  Nursing note and vitals reviewed.  LABORATORY DATA:  CBC    Component Value Date/Time   WBC 6.4 08/10/2016 1154   RBC 3.30 (L) 08/10/2016 1154   HGB 10.2 (L) 08/10/2016 1154   HCT 29.5 (L) 08/10/2016 1154   PLT 216 08/10/2016 1154   MCV 89.4 08/10/2016 1154   MCH 30.9 08/10/2016 1154   MCHC 34.6 08/10/2016 1154   RDW 18.6 (H) 08/10/2016 1154   LYMPHSABS 0.6 (L) 08/10/2016 1154   MONOABS 1.3 (H) 08/10/2016 1154   EOSABS 0.1 08/10/2016 1154   BASOSABS 0.0 08/10/2016 1154   CMP Latest Ref Rng & Units 08/10/2016 07/27/2016 07/15/2016  Glucose 65 - 99 mg/dL 99 100(H) 117(H)  BUN 6 - 20 mg/dL 22(H) 15 18  Creatinine 0.61 - 1.24 mg/dL 1.08 0.88 0.84  Sodium 135 - 145 mmol/L 137 136 134(L)  Potassium 3.5 - 5.1 mmol/L 3.8 3.6 3.7  Chloride 101 - 111 mmol/L 103 102 99(L)  CO2 22 - 32 mmol/L 27 27 27   Calcium 8.9 - 10.3 mg/dL 9.0 9.1 9.2  Total Protein 6.5 - 8.1 g/dL - - 6.9  Total Bilirubin 0.3 - 1.2 mg/dL - - 0.5  Alkaline Phos 38 - 126 U/L - - 62  AST 15 - 41 U/L - - 16  ALT 17 - 63 U/L - - 24     Chemistry      Component Value Date/Time   NA 137 08/10/2016 1154   K 3.8 08/10/2016 1154   CL 103 08/10/2016 1154   CO2 27 08/10/2016 1154   BUN 22 (H) 08/10/2016 1154   CREATININE 1.08 08/10/2016 1154   CREATININE 1.01 09/24/2015 0909      Component Value Date/Time   CALCIUM 9.0 08/10/2016 1154   ALKPHOS 62 07/15/2016 0933   AST 16 07/15/2016 0933   ALT 24 07/15/2016 0933   BILITOT 0.5 07/15/2016 0933      RADIOGRAPHY: I have personally reviewed the radiological images as listed and agreed with the findings in the report.  PET Scan 08/20/2016 IMPRESSION: 1. Complete metabolic response. No residual hypermetabolism in the thoracic esophagus or in  the upper gastrohepatic ligament lymph node, which is decreased in size. 2. Aortic atherosclerosis.  Three-vessel coronary atherosclerosis.  PATHOLOGY:     ASSESSMENT/PLAN:   Squamous cell carcinoma of esophagus (HCC) H/O alcohol abuse Tobacco Use H/O prostate carcinoma  Invasive squamous cell carcinoma of esophagus, incompletely staged at this time, at 33-36 cm from incisors. He is scheduled for CT CAP w contrast on 04/26/2016 by Dr. Laural Golden. If CT scans are negative for Stage IV disease, then we will pursue EUS for staging purposes. He notes difficulty getting to [place], but feels able with adequate notice.   I have reviewed patient's PET scan in detail with him. He has no residual disease and had a complete response post chemoRT. I have discussed with him that his best chance for cure is now by proceeding with surgical resection. I have discussed this there is a chance of recurrence in the future which will mean more chemotherapy with him. After a long discussion after which both myself and Robynn Pane, PA spoke with the patient that we recommended to proceed with surgical resection, patient has opted to forego surgery and chose observation.  I will get a repeat CT C/A/P prior to the  next visit.   RTC in 3 months for follow up and to review CT results.  All questions were answered. The patient knows to call the clinic with any problems, questions or concerns. We can certainly see the patient much sooner if necessary.  This document serves as a record of services personally performed by Twana First, MD. It was created on her behalf by Martinique Casey, a trained medical scribe. The creation of this record is based on the scribe's personal observations and the provider's statements to them. This document has been checked and approved by the attending provider.  I have reviewed the above documentation for accuracy and completeness and I agree with the above.  This note is electronically signed  by: Martinique M Casey  08/25/2016 8:38 AM

## 2016-08-25 NOTE — Patient Instructions (Addendum)
[place] at [facility] Discharge Instructions  RECOMMENDATIONS MADE BY THE CONSULTANT AND ANY TEST RESULTS WILL BE SENT TO YOUR REFERRING PHYSICIAN.  You were seen today by Dr. Twana First CT scan will be scheduled Follow up in 3 months with lab work See Amy up front for appointments   Thank you for choosing [place] at [facility] to provide your oncology and hematology care.  To afford each patient quality time with our provider, please arrive at least 15 minutes before your scheduled appointment time.    If you have a lab appointment with the [facility] please come in thru the  Main Entrance and check in at the main information desk  You need to re-schedule your appointment should you arrive 10 or more minutes late.  We strive to give you quality time with our providers, and arriving late affects you and other patients whose appointments are after yours.  Also, if you no show three or more times for appointments you may be dismissed from the clinic at the providers discretion.     Again, thank you for choosing [facility].  Our hope is that these requests will decrease the amount of time that you wait before being seen by our physicians.       _____________________________________________________________  Should you have questions after your visit to [facility], please contact our office at [phone] between the hours of 8:30 a.m. and 4:30 p.m.  Voicemails left after 4:30 p.m. will not be returned until the following business day.  For prescription refill requests, have your pharmacy contact our office.       Resources For Cancer Patients and their Caregivers ? American Cancer Society: Can assist with transportation, wigs, general needs, runs Look Good Feel Better.        8780476567 ? Cancer Care: Provides financial assistance, online support groups, medication/co-pay assistance.   1-800-813-HOPE 626 809 1050) ? [facility] [place] Assists [place] cancer patients and their families through emotional , educational and financial support.  (850)345-4959 ? [place] DSS Where to apply for food stamps, Medicaid and utility assistance. 208-608-7899 ? [facility]: Transportation to medical appointments. 279-196-7526 ? Social Security Administration: May apply for disability if have a Stage IV cancer. 325 170 3793 618-310-6526 ? [employer], Disability and Transit Services: Assists with nutrition, care and transit needs. [place] Support Programs: @10RELATIVEDAYS @ > Cancer Support Group  2nd Tuesday of the month 1pm-2pm, Journey Room  > Creative Journey  3rd Tuesday of the month 1130am-1pm, Journey Room  > Look Good Feel Better  1st Wednesday of the month 10am-12 noon, Journey Room (Call [place] to register [phone])

## 2016-08-31 ENCOUNTER — Ambulatory Visit (HOSPITAL_COMMUNITY): Payer: Medicaid Other | Admitting: Speech Pathology

## 2016-08-31 ENCOUNTER — Encounter (HOSPITAL_COMMUNITY): Payer: Self-pay

## 2016-08-31 ENCOUNTER — Encounter: Payer: Self-pay | Admitting: *Deleted

## 2016-08-31 ENCOUNTER — Encounter (HOSPITAL_COMMUNITY): Payer: Medicaid Other | Admitting: Dietician

## 2016-08-31 ENCOUNTER — Encounter (HOSPITAL_BASED_OUTPATIENT_CLINIC_OR_DEPARTMENT_OTHER): Payer: Medicaid Other

## 2016-08-31 ENCOUNTER — Encounter (HOSPITAL_COMMUNITY): Payer: Self-pay | Admitting: Speech Pathology

## 2016-08-31 ENCOUNTER — Ambulatory Visit (HOSPITAL_COMMUNITY): Payer: Medicaid Other | Attending: Oncology

## 2016-08-31 ENCOUNTER — Encounter (HOSPITAL_COMMUNITY): Payer: Self-pay | Admitting: Emergency Medicine

## 2016-08-31 DIAGNOSIS — R1312 Dysphagia, oropharyngeal phase: Secondary | ICD-10-CM | POA: Diagnosis present

## 2016-08-31 DIAGNOSIS — C159 Malignant neoplasm of esophagus, unspecified: Secondary | ICD-10-CM

## 2016-08-31 DIAGNOSIS — R29898 Other symptoms and signs involving the musculoskeletal system: Secondary | ICD-10-CM | POA: Diagnosis present

## 2016-08-31 MED ORDER — TRIAMCINOLONE ACETONIDE 0.1 % EX CREA: 1.0000 "application " | TOPICAL_CREAM | Freq: Two times a day (BID) | CUTANEOUS | 2 refills | Status: DC

## 2016-08-31 NOTE — Progress Notes (Signed)
Pt seen at the [facility] clinic today.  Pt is doing well but states that he is having a little trouble getting over having chemo.  He walked to the clinic today.  Pt saw the dietician, occupational therapy, speech therapy, and the Education officer, museum.  Pt also complained of rash all over body of what looked like to be psoriasis.  Pt states that he had it about five years ago.  Recently it has become worse after finishing chemo.  I spoke with Kirby Crigler PA and called in triamcinolone cream for the patient to use.  I have also routed this note to his PCP.

## 2016-08-31 NOTE — Therapy (Signed)
Stanley Jimenez, [place], [address] Phone: [phone]   Fax:  [phone]  Speech Language Pathology Evaluation  Patient Details  Name: Stanley Jimenez MRN: [id number] Date of Birth: 02-26-1956 No Data Recorded  Encounter Date: 08/31/2016      End of Session - 08/31/16 1448    Visit Number 1   Number of Visits 1   Authorization Type Medicaid   SLP Start Time 0945   SLP Stop Time  73   SLP Time Calculation (min) 34 min   Activity Tolerance Patient tolerated treatment well      Past Medical History:  Diagnosis Date  . 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 Anxiety   . Arthritis   . Cirrhosis, alcoholic ([place])   . Clubbing of fingers    congenital  . Depression   . Full dentures   . History of PSVT (paroxysmal supraventricular tachycardia)    run of non-sustatined VT 03-10-2015 in setting of alcohol withdrawal in hospital  . History of seizure    03-08-2012  alcohol withdrawal  . History of thrombocytopenia    10/ 2013  in setting of alcohol withdrawal  . Hyperlipidemia   . Hypertension   . Prostate cancer [facility]) urologist-  dr dalhstedt/  oncologist- dr Tammi Klippel   T1c, Gleason 3+3,  PSA 8.89,  vol 27cc  . 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 Squamous cell carcinoma of esophagus ([place]) 04/21/2016    Past Surgical History:  Procedure Laterality Date  . BIOPSY  04/14/2016   Procedure: BIOPSY;  Surgeon: Rogene Houston, MD;  [facility];  Service: Endoscopy;;  esophagus  . COLONOSCOPY N/A 04/14/2016   Procedure: COLONOSCOPY;  Surgeon: Rogene Houston, MD;  [facility];  Service: Endoscopy;  Laterality: N/A;  . ESOPHAGOGASTRODUODENOSCOPY N/A 04/14/2016   Procedure: ESOPHAGOGASTRODUODENOSCOPY (EGD);  Surgeon: Rogene Houston, MD;  [facility];  Service: Endoscopy;  Laterality:  N/A;  1:00  . NO PAST SURGERIES    . PORTACATH PLACEMENT Right 05/31/2016   Procedure: INSERTION OF TUNNELED RIGHT INTERNAL JUGULAR BARD POWERPORT CENTRAL VENOUS CATHETER WITH SUBCUTANEOUS PORT;  Surgeon: Vickie Epley, MD;  Location: [facility];  Service: Vascular;  Laterality: Right;  . RADIOACTIVE SEED IMPLANT N/A 09/20/2014   Procedure: RADIOACTIVE SEED IMPLANT;  Surgeon: Franchot Gallo, MD;  Location: [facility];  Service: Urology;  Laterality: N/A;    77   seeds implanted no seeds found in bladder  . TRANSURETHRAL RESECTION OF PROSTATE      There were no vitals filed for this visit.      Subjective Assessment - 08/31/16 1444    Subjective "I eat creamed potatoes."   Currently in Pain? No/denies           Prior Functional Status - 08/31/16 1446      Prior Functional Status   Cognitive/Linguistic Baseline Within functional limits   Type of Home House    Lives With Alone   Vocation On disability         General - 08/31/16 1446      General Information   Date of Onset 04/23/16   HPI [name] is a 61 y/o male S/P squamous cell carcinoma of esophagus (TX cN1 cM0) which was diagnosed on 04/23/14. Pt has completed chemotherapy and radiation  therapy at this time with good results. Surgery is an option for him although he is not interested in that route at this time as he is very independent and does not want to rely on anyone for assistance. He was referred to the [facility] for a swallowing evaluation/treat by Dr. Twana First.   Type of Study Bedside Swallow Evaluation   Previous Swallow Assessment None on record   Diet Prior to this Study Regular;Thin liquids   Temperature Spikes Noted No   Respiratory Status Room air   History of Recent Intubation No   Behavior/Cognition Alert;Cooperative;Pleasant mood   Oral Cavity Assessment Within Functional Limits   Oral Care Completed by SLP No   Oral Cavity - Dentition Dentures, top;Dentures, bottom    Vision Functional for self-feeding   Self-Feeding Abilities Able to feed self   Patient Positioning Upright in chair   Baseline Vocal Quality Normal   Volitional Cough Strong   Volitional Swallow Able to elicit          Oral Motor/Sensory Function - 08/31/16 1447      Oral Motor/Sensory Function   Overall Oral Motor/Sensory Function Within functional limits         Ice Chips - 08/31/16 1447      Ice Chips   Ice chips Not tested         Thin Liquid - 08/31/16 1447      Thin Liquid   Thin Liquid Within functional limits   Presentation Cup;Self Fed;Straw         Nectar thick liquid - 08/31/16 1447      Nectar Thick Liquid   Nectar Thick Liquid Not tested         Honey Thick Liquid - 08/31/16 1447      Honey Thick Liquid   Honey Thick Liquid Not tested         Puree - 08/31/16 1447      Puree   Puree Within functional limits   Presentation Spoon;Self Fed         Solid - 08/31/16 1448      Solid   Solid Within functional limits   Presentation Self Fed           SLP Education - 08/31/16 1445    Education provided Yes   Education Details Discussed signs and symptoms of aspiration and dysphagia related to s/p radiation treatment to esophagus; soft food ideas   Person(s) Educated Patient   Methods Explanation;Handout   Comprehension Verbalized understanding           Plan - 08/31/16 1449    Clinical Impression Statement Pt seen for a clinical swallow assessment following chemoradiation therapy for esophageal cancer. Pt has successfully completed treatment and is now starting to gain weight. He reports that he is able to consume mostly regular textures and thin liquids and denies globus sensation, coughing, or shortness of breath. Oral motor examination completed and is unremarkable and notable for good oral hygiene. Pt self presented thin water, applesauce, and graham crackers without signs/symptoms of aspiration. SLP explained possible long  term side effects of radiation related to swallowing (ie. Need for possible EGD with dilation if pt notes difficulty swallowing meats/bread/pills). He was also presented with soft food ideas, however he is currently tolerating regular textures. Pt noted with scales and flaking of skin for which he states is psoriasis and is using triamcinolone cream. I advised him to speak with his PCP, Dr. Moshe Cipro about this. No further SLP  services indicated at this time.    Treatment/Interventions Patient/family education   Potential to Achieve Goals Good   Consulted and Agree with Plan of Care Patient      Patient will benefit from skilled therapeutic intervention in order to improve the following deficits and impairments:   Dysphagia, oropharyngeal phase    Problem List Patient Active Problem List   Diagnosis Date Noted  . 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  . Dysphagia 01/14/2016  . Malignant neoplasm of prostate ([place]) 08/14/2014  . 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 H/O alcohol dependence ([place]) 01/26/2008  . NICOTINE ADDICTION 12/22/2007  . Essential hypertension 12/22/2007   Thank you,  Genene Churn, [place]  [facility] 08/31/2016, 3:02 PM  [place] [address] [place], [place], [address] Phone: [phone]   Fax:  [phone]  Name: TABITHA TUPPER MRN: [id number] Date of Birth: [birth year]

## 2016-08-31 NOTE — Progress Notes (Signed)
[place] Note Clinical Social Work  Clinical Social Work has assisted pt in the past during his cancer treatment due to financial and transportation concerns. Clinical Social Worker met with patient at [facility] to offer support and re-assess for needs.  Pt reports he continues to use [employer] for transportation, but forgot to it set up today and decided to walk. He feels very comfortable riding [employer] and has used this regularly in the past. Pt also has supportive sister that assists him at times. Pt shared he still has some side effects from his cancer treatment, but "just has to do things". He shared he is able to do more and more, but just has to make himself do things. CSW discussed common survivorship concerns, emotions and coping. CSW also educated pt about the [place] coming to the [employer]. He is interested and plans to consider. Pt denied other needs currently and shared he still receives food stamps and has his medicaid in place as well. Pt agreed to reach out as needed.     Clinical Social Work interventions: Resource education and referral   Loren Racer, LCSW, OSW-C [place] Tuesdays   [phone]

## 2016-08-31 NOTE — Therapy (Signed)
[place], [place], [address] Phone: [phone]   Fax:  [phone]  Occupational Therapy Evaluation  Patient Details  Name: Stanley Jimenez MRN: [id number] Date of Birth: [birth year] Referring Provider: Dr. Twana First  Encounter Date: 08/31/2016      OT End of Session - 08/31/16 1145    Visit Number 1   Number of Visits 1   Authorization Type Medicaid   Authorization - Visit Number 1   Authorization - Number of Visits 1   OT Start Time 6962   OT Stop Time 1035   OT Time Calculation (min) 20 min   Activity Tolerance Patient tolerated treatment well   Behavior During Therapy [facility] for tasks assessed/performed      Past Medical History:  Diagnosis Date  . 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 Anxiety   . Arthritis   . Cirrhosis, alcoholic ([place])   . Clubbing of fingers    congenital  . Depression   . Full dentures   . History of PSVT (paroxysmal supraventricular tachycardia)    run of non-sustatined VT 03-10-2015 in setting of alcohol withdrawal in hospital  . History of seizure    03-08-2012  alcohol withdrawal  . History of thrombocytopenia    10/ 2013  in setting of alcohol withdrawal  . Hyperlipidemia   . Hypertension   . Prostate cancer [facility]) urologist-  dr dalhstedt/  oncologist- dr Tammi Klippel   T1c, Gleason 3+3,  PSA 8.89,  vol 27cc  . 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 BIOPSY  04/14/2016   Procedure: BIOPSY;  Surgeon: Rogene Houston, MD;  [facility];  Service: Endoscopy;;  esophagus  . COLONOSCOPY N/A 04/14/2016   Procedure: COLONOSCOPY;  Surgeon: Rogene Houston, MD;  [facility];  Service: Endoscopy;  Laterality: N/A;  . ESOPHAGOGASTRODUODENOSCOPY N/A  04/14/2016   Procedure: ESOPHAGOGASTRODUODENOSCOPY (EGD);  Surgeon: Rogene Houston, MD;  [facility];  Service: Endoscopy;  Laterality: N/A;  1:00  . NO PAST SURGERIES    . PORTACATH PLACEMENT Right 05/31/2016   Procedure: INSERTION OF TUNNELED RIGHT INTERNAL JUGULAR BARD POWERPORT CENTRAL VENOUS CATHETER WITH SUBCUTANEOUS PORT;  Surgeon: Vickie Epley, MD;  Location: [facility];  Service: Vascular;  Laterality: Right;  . RADIOACTIVE SEED IMPLANT N/A 09/20/2014   Procedure: RADIOACTIVE SEED IMPLANT;  Surgeon: Franchot Gallo, MD;  Location: [facility];  Service: Urology;  Laterality: N/A;    77   seeds implanted no seeds found in bladder  . TRANSURETHRAL RESECTION OF PROSTATE      There were no vitals filed for this visit.      Subjective Assessment - 08/31/16 1134    Subjective  S: I walk a lot every day.    Pertinent History patient is diagnosed with squamous cell carcinoma of esophagus (TX cN1 cM0). patient was seen by therapist at [facility] on 08/31/16. Pt has complete chemotherapy and radiation therapy at this time with good results. Surgery is an option for him although he is not interested in that route at this time as he is very independent and does not want to rely on anyone for assistance.    Patient Stated Goals  To learn lymphedema reduction    Currently in Pain? No/denies           [facility] OT Assessment - 08/31/16 1137      Assessment   Diagnosis Squamous Cell Carcinoma of Esophagus   Referring Provider Dr. Twana First   Onset Date 04/23/14   Prior Therapy None     Precautions   Precautions Other (comment)  Active cancer     Restrictions   Weight Bearing Restrictions No     Balance Screen   Has the patient fallen in the past 6 months No     Home  Environment   Family/patient expects to be discharged to: Private residence   [place] On  disability   Leisure Patient reports that he walks 1-2 miles every day.     ADL   ADL comments Pt reports no current difficulty with completing ADL tasks.      Mobility   Mobility Status Independent     Written Expression   Dominant Hand Right     Vision - History   Baseline Vision No visual deficits     Cognition   Overall Cognitive Status Within Functional Limits for tasks assessed     ROM / Strength   AROM / PROM / Strength AROM;Strength     AROM   Overall AROM  Within functional limits for tasks performed   Overall AROM Comments bilateral shoulders and cervical region.   AROM Assessment Site --     Strength   Strength Assessment Site Shoulder   Right/Left Shoulder Left;Right   Right Shoulder Flexion 5/5   Right Shoulder ABduction 5/5   Right Shoulder Internal Rotation 5/5   Right Shoulder External Rotation 5/5   Left Shoulder Flexion 5/5   Left Shoulder ABduction 5/5   Left Shoulder Internal Rotation 5/5   Left Shoulder External Rotation 5/5          LYMPHEDEMA/ONCOLOGY QUESTIONNAIRE - 08/31/16 1140      Type   Cancer Type Squamous cell Carcinoma of Esophagus     Treatment   Past Chemotherapy Treatment Yes   Past Radiation Treatment Yes   Body Site right upper quadrant      What other symptoms do you have   Are you Having Heaviness or Tightness No   Are you having Pain No   Are you having pitting edema No   Is it Hard or Difficult finding clothes that fit No   Do you have infections No   Is there Decreased scar mobility No     Lymphedema Assessments   Lymphedema Assessments Upper extremities;Head and Neck     Right Upper Extremity Lymphedema   10 cm Proximal to Olecranon Process 27.5 cm   Olecranon Process 25 cm   10 cm Proximal to Ulnar Styloid Process 20 cm   Just Proximal to Ulnar Styloid Process 17 cm   At Base of 2nd Digit 7 cm     Left Upper Extremity Lymphedema   10 cm Proximal to Olecranon Process 27.5 cm   Olecranon Process 25 cm    10 cm Proximal to Ulnar Styloid Process 21 cm   Just Proximal to Ulnar Styloid Process 16.5 cm   At Base of 2nd Digit 6.5 cm     Head and Neck   4 cm superior to sternal notch around neck 35 cm   6 cm superior to  sternal notch around neck 37 cm   8 cm superior to sternal notch around neck --  Neck was not long enough to measure                        OT Education - 08/31/16 1144    Education provided Yes   Education Details Discussed the importance of walking and encouraged patient to remain active and continue walking as he is now doing. Discussed lymphedema and the current risk level and how it is managed.    Person(s) Educated Patient   Methods Explanation;Verbal cues;Handout   Comprehension Verbalized understanding          OT Short Term Goals - 08/31/16 1149      OT SHORT TERM GOAL #1   Title Patient will be able to verbalize understanding of a home exercise program for walking.    Time 1   Period Days   Status Achieved     OT SHORT TERM GOAL #2   Title Patient will be able to verbalize understanding of proper sitting and standing posture.    Time 1   Period Days   Status Achieved     OT SHORT TERM GOAL #3   Title Patient will be able to verbalize understanding of lymphedema risk and availability of treatement for this condition.    Time 1   Period Days   Status Achieved                  Plan - 08/31/16 1146    Clinical Impression Statement A: patient is a 61 y/o male S/P squamous cell carcinoma of esophagus which was diagnosed on 04/23/14. Patient has recently finished chemotherapy and radiation with good results. He is declining surgery at this time which would take away his cancer 100%. He reports no pain. Slight fatigue at times although he verbalizes that he is able to monitor it and adjust his energy expenditure as needed. He is currently active, waling 1-2 miles a day. Patient was encouraged to continue. He was education on lymphedema  risk and ways to reduce as well as signs to swelling and to be aware of any that may occur.     Rehab Potential Excellent   OT Frequency One time visit   OT Treatment/Interventions Patient/family education   Plan P: Patient will follow-up with therapy as needed.    Consulted and Agree with Plan of Care Patient      Patient will benefit from skilled therapeutic intervention in order to improve the following deficits and impairments:  Decreased strength  Visit Diagnosis: Squamous cell carcinoma of esophagus (HCC)  Other symptoms and signs involving the musculoskeletal system    Problem List Patient Active Problem List   Diagnosis Date Noted  . 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  . Dysphagia 01/14/2016  . Malignant neoplasm of prostate ([place]) 08/14/2014  . 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 H/O alcohol dependence ([place]) 01/26/2008  . NICOTINE ADDICTION 12/22/2007  . Essential hypertension 12/22/2007   Ailene Ravel, OTR/L,CBIS  [phone]  08/31/2016, 11:51 AM  [place] [address] [place], [place], [address] Phone: [phone]   Fax:  [phone]  Name: Stanley Jimenez MRN: [id number] Date of Birth: August 26, 1955

## 2016-08-31 NOTE — Progress Notes (Signed)
Nutrition Follow-up:   ASSESSMENT: 61 y/o male s/p Chemoradiation for Squamous cell carcinoma of esophagus.   Patient present as part of high risk multidisciplinary clinic.   Patient finished therapy at end of February. He has elected not to pursue surgical intervention and to just monitor.   He states he has largely returned to pre-treatment status.    Weight today is 146.4 lbs. He says his UBW prior to surgery was 148 lbs. He has regained a 6 lbs since he finished treatment.   He is now eating 3 meals per day. He denies any painful swallowing. Denies any dysphagia. Denies any n/v/c/d.   He is no longer consuming Ensure  Medications: Zofran, ppi, Hydrocodone.   Labs:  Nothing of relevance. Wdl   Recent Labs Lab 08/25/16 0910  NA 136  K 3.9  CL 101  CO2 26  BUN 20  CREATININE 1.08  CALCIUM 9.1  GLUCOSE 94   Anthropometrics:  Height: 5\' 10"  Weight: 146.4 lbs (66.54 kg) UBW: 148 lbs BMI: 21.1  Wt Readings from Last 10 Encounters:  08/25/16 148 lb (67.1 kg)  08/24/16 148 lb (67.1 kg)  07/20/16 140 lb 3.2 oz (63.6 kg)  07/15/16 144 lb 3.2 oz (65.4 kg)  07/13/16 145 lb (65.8 kg)  07/09/16 145 lb 3.2 oz (65.9 kg)  07/07/16 144 lb 12.8 oz (65.7 kg)  07/02/16 147 lb (66.7 kg)  06/30/16 158 lb 12.8 oz (72 kg)  06/23/16 147 lb 3.2 oz (66.8 kg)   Estimated Energy Needs Kcals: 1800-2000 kcals (27-30 kcal/kg bw) Protein: 75-87g Pro (1.1-1.3 g/kg bw) Fluid: >2 Liters  NUTRITION DIAGNOSIS: Increased kcal/protein needs-resolving  MALNUTRITION DIAGNOSIS:  None  INTERVENTION:  Pt still has a small amount of trouble eating foods that are not soft. He is not interested in using a food processor.   RD provided handout entitled "Soft and Moist High Protein Menu ideas". Recommended trying to consume these often when having trouble swallowing.   Reiterated what good sources of kcals/Protein were.   He has no nutrition questions and largely has no complaints.     MONITORING, EVALUATION, GOAL: Wt stability   NEXT VISIT: N/A: Nutrition to sign off. Pt is stable  Burtis Junes RD, LDN, CNSC Clinical Nutrition Pager: [phone] 08/31/2016 10:35 AM

## 2016-09-01 NOTE — Progress Notes (Signed)
[place] [facility]

## 2016-10-05 ENCOUNTER — Other Ambulatory Visit (HOSPITAL_COMMUNITY): Payer: Self-pay | Admitting: *Deleted

## 2016-10-05 MED ORDER — TRIAMCINOLONE ACETONIDE 0.1 % EX CREA: 1.0000 "application " | TOPICAL_CREAM | Freq: Two times a day (BID) | CUTANEOUS | 2 refills | Status: DC

## 2016-10-20 LAB — TSH: TSH: 0.71 mIU/L (ref 0.40–4.50)

## 2016-10-20 LAB — LIPID PANEL
CHOL/HDL RATIO: 2.6 ratio (ref <5.0)
CHOLESTEROL: 169 mg/dL (ref <200)
HDL: 66 mg/dL (ref >40)
LDL CALC: 91 mg/dL (ref <100)
TRIGLYCERIDES: 60 mg/dL (ref <150)
VLDL: 12 mg/dL (ref <30)

## 2016-10-20 LAB — BASIC METABOLIC PANEL
BUN: 17 mg/dL (ref 7–25)
CALCIUM: 9.2 mg/dL (ref 8.6–10.3)
CO2: 28 mmol/L (ref 20–31)
Chloride: 108 mmol/L (ref 98–110)
Creat: 1.09 mg/dL (ref 0.70–1.33)
GLUCOSE: 90 mg/dL (ref 65–99)
Potassium: 4.1 mmol/L (ref 3.5–5.3)
SODIUM: 142 mmol/L (ref 135–146)

## 2016-10-27 ENCOUNTER — Ambulatory Visit (INDEPENDENT_AMBULATORY_CARE_PROVIDER_SITE_OTHER): Payer: Medicaid Other | Admitting: Family Medicine

## 2016-10-27 ENCOUNTER — Encounter: Payer: Self-pay | Admitting: Family Medicine

## 2016-10-27 VITALS — BP 108/64 | HR 93 | Resp 16 | Ht 70.0 in | Wt 143.0 lb

## 2016-10-27 DIAGNOSIS — I10 Essential (primary) hypertension: Secondary | ICD-10-CM | POA: Diagnosis not present

## 2016-10-27 DIAGNOSIS — L404 Guttate psoriasis: Secondary | ICD-10-CM

## 2016-10-27 DIAGNOSIS — R938 Abnormal findings on diagnostic imaging of other specified body structures: Secondary | ICD-10-CM | POA: Diagnosis not present

## 2016-10-27 DIAGNOSIS — C159 Malignant neoplasm of esophagus, unspecified: Secondary | ICD-10-CM

## 2016-10-27 DIAGNOSIS — F17200 Nicotine dependence, unspecified, uncomplicated: Secondary | ICD-10-CM

## 2016-10-27 DIAGNOSIS — R9389 Abnormal findings on diagnostic imaging of other specified body structures: Secondary | ICD-10-CM

## 2016-10-27 NOTE — Patient Instructions (Addendum)
Annual physical exam first week in December, call if you need me before  Please continue to work on quitting smoking, you need to [place]  I am referring you the cardiologist in [place] at the hospital because your recent chest scan shows that you have vascular disease in your heart  Use nicotine substitue INSTEAD of cigarettes to help you to quit smoking  PLease reconsider surgery for management of your esophageal cancer as that is what is best recommended by the specialist for cure      Steps to Quit Smoking Smoking tobacco can be bad for your health. It can also affect almost every organ in your body. Smoking puts you and people around you at risk for many serious long-lasting (chronic) diseases. Quitting smoking is hard, but it is one of the best things that you can do for your health. It is never too late to quit. What are the benefits of quitting smoking? When you quit smoking, you lower your risk for getting serious diseases and conditions. They can include:  Lung cancer or lung disease.  Heart disease.  Stroke.  Heart attack.  Not being able to have children (infertility).  Weak bones (osteoporosis) and broken bones (fractures).  If you have coughing, wheezing, and shortness of breath, those symptoms may get better when you quit. You may also get sick less often. If you are pregnant, quitting smoking can help to lower your chances of having a baby of low birth weight. What can I do to help me quit smoking? Talk with your doctor about what can help you quit smoking. Some things you can do (strategies) include:  Quitting smoking totally, instead of slowly cutting back how much you smoke over a period of time.  Going to in-person counseling. You are more likely to quit if you go to many counseling sessions.  Using resources and support systems, such as: ? Database administrator with a Social worker. ? Phone quitlines. ? Careers information officer. ? Support groups or group  counseling. ? Text messaging programs. ? Mobile phone apps or applications.  Taking medicines. Some of these medicines may have nicotine in them. If you are pregnant or breastfeeding, do not take any medicines to quit smoking unless your doctor says it is okay. Talk with your doctor about counseling or other things that can help you.  Talk with your doctor about using more than one strategy at the same time, such as taking medicines while you are also going to in-person counseling. This can help make quitting easier. What things can I do to make it easier to quit? Quitting smoking might feel very hard at first, but there is a lot that you can do to make it easier. Take these steps:  Talk to your family and friends. Ask them to support and encourage you.  Call phone quitlines, reach out to support groups, or work with a Social worker.  Ask people who smoke to not smoke around you.  Avoid places that make you want (trigger) to smoke, such as: ? Bars. ? Parties. ? Smoke-break areas at work.  Spend time with people who do not smoke.  Lower the stress in your life. Stress can make you want to smoke. Try these things to help your stress: ? Getting regular exercise. ? Deep-breathing exercises. ? Yoga. ? Meditating. ? Doing a body scan. To do this, close your eyes, focus on one area of your body at a time from head to toe, and notice which parts of your body  are tense. Try to relax the muscles in those areas.  Download or buy apps on your mobile phone or tablet that can help you stick to your quit plan. There are many free apps, such as QuitGuide from the [employer] Office manager for Disease Control and Prevention). You can find more support from smokefree.gov and other websites.  This information is not intended to replace advice given to you by your health care provider. Make sure you discuss any questions you have with your health care provider. Document Released: 03/06/2009 Document Revised: 01/06/2016  Document Reviewed: 09/24/2014 Elsevier Interactive Patient Education  2018 [employer].

## 2016-10-27 NOTE — Progress Notes (Signed)
Stanley Jimenez     MRN: [id number]      DOB: 1955/07/06   HPI Stanley Jimenez is here for follow up and re-evaluation of chronic medical conditions, medication management and review of any available recent lab and radiology data.  Preventive health is updated, specifically  Cancer screening and Immunization.   Questions or concerns regarding consultations or procedures which the PT has had in the interim are  Addressed.recently diagnosed with  The PT denies any adverse reactions to current medications since the last visit.  There are no new concerns.  There are no specific complaints   ROS Denies recent fever or chills. Denies sinus pressure, nasal congestion, ear pain or sore throat. Denies chest congestion, productive cough or wheezing. Denies chest pains, palpitations and leg swelling Denies abdominal pain, nausea, vomiting,diarrhea or constipation.   Denies dysuria, frequency, hesitancy or incontinence. Denies joint pain, swelling and limitation in mobility. Denies headaches, seizures, numbness, or tingling. Denies depression, anxiety or insomnia. Denies skin break down or rash.   PE  BP 108/64   Pulse 93   Resp 16   Ht 5\' 10"  (1.778 m)   Wt 143 lb (64.9 kg)   SpO2 97%   BMI 20.52 kg/m   Patient alert and oriented and in no cardiopulmonary distress.  HEENT: No facial asymmetry, EOMI,   oropharynx pink and moist.  Neck supple no JVD, no mass.  Chest: Clear to auscultation bilaterally.  CVS: S1, S2 no murmurs, no S3.Regular rate.  ABD: Soft non tender.   Ext: No edema  MS: Adequate ROM spine, shoulders, hips and knees.  Skin: Intact, no ulcerations or rash noted.  Psych: Good eye contact, normal affect. Memory intact not anxious or depressed appearing.  CNS: CN 2-12 intact, power,  normal throughout.no focal deficits noted.   Assessment & Plan Essential hypertension Controlled, no change in medication DASH diet and commitment to daily physical activity for  a minimum of 30 minutes discussed and encouraged, as a part of hypertension management. The importance of attaining a healthy weight is also discussed.  BP/Weight 10/27/2016 08/25/2016 08/24/2016 07/20/2016 07/16/2016 07/15/2016 2/45/8099  Systolic BP 833 825 053 976 734 193 790  Diastolic BP 64 68 69 64 55 68 66  Wt. (Lbs) 143 148 148 140.2 - 144.2 145  BMI 20.52 21.24 21.24 19.83 - 20.4 20.51       NICOTINE ADDICTION Patient is asked and  confirms current  Nicotine use.  Five to seven minutes of time is spent in counseling the patient of the need to quit smoking  Advice to quit is delivered clearly specifically in reducing the risk of developing heart disease, having a stroke, or of developing all types of cancer, especially lung and oral cancer. Improvement in breathing and exercise tolerance and quality of life is also discussed, as is the economic benefit.  Assessment of willingness to quit or to make an attempt to quit is made and documented  Assistance in quit attempt is made with several and varied options presented, based on patient's desire and need. These include  literature, local classes available, 1800 QUIT NOW number, OTC and prescription medication.  The GOAL to be NICOTINE FREE is re emphasized.  The patient has set a personal goal of either reduction or discontinuation and follow up is arranged between 6 an 16 weeks.    Psoriasis, guttate Markedly improved, not using medication currently  Squamous cell carcinoma of esophagus (HCC) Encouraged pt to reconsider the option of  surgery which was proposed as curative, currently receiving chemotherapy  Abnormal CT scan, chest Chest scan shows CAD and pt is a current smoker, with hypertension, refer to cardiology for further evaluation

## 2016-10-29 ENCOUNTER — Encounter: Payer: Self-pay | Admitting: Family Medicine

## 2016-10-29 DIAGNOSIS — R9389 Abnormal findings on diagnostic imaging of other specified body structures: Secondary | ICD-10-CM | POA: Insufficient documentation

## 2016-10-29 NOTE — Assessment & Plan Note (Signed)
Markedly improved, not using medication currently

## 2016-10-29 NOTE — Assessment & Plan Note (Signed)
Encouraged pt to reconsider the option of surgery which was proposed as curative, currently receiving chemotherapy

## 2016-10-29 NOTE — Assessment & Plan Note (Signed)
Chest scan shows CAD and pt is a current smoker, with hypertension, refer to cardiology for further evaluation

## 2016-10-29 NOTE — Assessment & Plan Note (Signed)
Controlled, no change in medication DASH diet and commitment to daily physical activity for a minimum of 30 minutes discussed and encouraged, as a part of hypertension management. The importance of attaining a healthy weight is also discussed.  BP/Weight 10/27/2016 08/25/2016 08/24/2016 07/20/2016 07/16/2016 07/15/2016 6/68/1594  Systolic BP 707 615 183 437 357 897 847  Diastolic BP 64 68 69 64 55 68 66  Wt. (Lbs) 143 148 148 140.2 - 144.2 145  BMI 20.52 21.24 21.24 19.83 - 20.4 20.51

## 2016-10-29 NOTE — Assessment & Plan Note (Signed)

## 2016-11-09 ENCOUNTER — Ambulatory Visit (INDEPENDENT_AMBULATORY_CARE_PROVIDER_SITE_OTHER): Payer: Medicaid Other | Admitting: Urology

## 2016-11-09 DIAGNOSIS — N320 Bladder-neck obstruction: Secondary | ICD-10-CM

## 2016-11-09 DIAGNOSIS — C61 Malignant neoplasm of prostate: Secondary | ICD-10-CM | POA: Diagnosis not present

## 2016-11-17 NOTE — Progress Notes (Addendum)
Cardiology Office Note   Date:  11/18/2016   ID:  Stanley, Jimenez [birth year], MRN [id number]  PCP:  [name], MD  Cardiologist:   Jenkins Rouge, MD   No chief complaint on file.     History of Present Illness: Stanley Jimenez is a 61 y.o. male who presents for consultation regarding HTN, smoking and abnormal chest CT.  He is referred by Dr Moshe Cipro He has esophageal cancer and is receiving chemo therapy. CT done 04/26/16 commented on aortic atherosclerosis and calcium seen in LM and all 3 major coronary arteries. Previous alcoholic. Distant history of PSVT in setting of ETOH withdrawal in 2016 CRF;s include smoking, HTN and HL.He is active waling his two dogs. No chest pain dyspnea palpitations He indicates still smoking but not drinking has lost 10 lbs since cancer diagnosis He reiterates not wanting surgery at this time    Past Medical History:  Diagnosis Date  . 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 Anxiety   . Arthritis   . Cirrhosis, alcoholic ([place])   . Clubbing of fingers    congenital  . Depression   . Full dentures   . History of PSVT (paroxysmal supraventricular tachycardia)    run of non-sustatined VT 03-10-2015 in setting of alcohol withdrawal in hospital  . History of seizure    03-08-2012  alcohol withdrawal  . History of thrombocytopenia    10/ 2013  in setting of alcohol withdrawal  . Hyperlipidemia   . Hypertension   . Prostate cancer [facility]) urologist-  dr dalhstedt/  oncologist- dr Tammi Klippel   T1c, Gleason 3+3,  PSA 8.89,  vol 27cc  . 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 BIOPSY  04/14/2016   Procedure: BIOPSY;  Surgeon: Rogene Houston, MD;  [facility];  Service: Endoscopy;;  esophagus  . COLONOSCOPY N/A 04/14/2016   Procedure: COLONOSCOPY;  Surgeon: Rogene Houston, MD;  Location[facility];  Service: Endoscopy;  Laterality: N/A;  . ESOPHAGOGASTRODUODENOSCOPY N/A 04/14/2016   Procedure: ESOPHAGOGASTRODUODENOSCOPY (EGD);  Surgeon: Rogene Houston, MD;  Location: [facility];  Service: Endoscopy;  Laterality: N/A;  1:00  . NO PAST SURGERIES    . PORTACATH PLACEMENT Right 05/31/2016   Procedure: INSERTION OF TUNNELED RIGHT INTERNAL JUGULAR BARD POWERPORT CENTRAL VENOUS CATHETER WITH SUBCUTANEOUS PORT;  Surgeon: Vickie Epley, MD;  Location: [facility];  Service: Vascular;  Laterality: Right;  . RADIOACTIVE SEED IMPLANT N/A 09/20/2014   Procedure: RADIOACTIVE SEED IMPLANT;  Surgeon: Franchot Gallo, MD;  Location[facility];  Service: Urology;  Laterality: N/A;    77   seeds implanted no seeds found in bladder  . TRANSURETHRAL RESECTION OF PROSTATE       Current Outpatient Prescriptions  Medication Sig Dispense Refill  . amLODipine (NORVASC) 10 MG tablet TAKE 1 TABLET BY MOUTH EVERY DAY IN THE MORNING FOR HIGH BLOOD PRESSURE 30 tablet 6  . ondansetron (ZOFRAN) 8 MG tablet Take 8 mg by mouth every 8 (eight) hours as needed for nausea or vomiting.    . pantoprazole (PROTONIX) 40 MG tablet Take 1 tablet (40 mg total) by mouth daily before breakfast. 30 tablet 5  . prochlorperazine (COMPAZINE) 10 MG tablet Take 10  mg by mouth every 6 (six) hours as needed for nausea or vomiting.    . tamsulosin (FLOMAX) 0.4 MG CAPS capsule Take 1 capsule (0.4 mg total) by mouth daily after supper. 30 capsule 5  . triamcinolone cream (KENALOG) 0.1 % Apply 1 application topically 2 (two) times daily. 453.6 g 2   No current facility-administered medications for this visit.     Allergies:   Patient has no known allergies.    Social History:  The patient  reports that he has been smoking Cigarettes.  He has a 10.00 pack-year smoking history. He quit smokeless tobacco use about 32 years ago. His smokeless  tobacco use included Chew. He reports that he does not use drugs.   Family History:  The patient's family history includes Alcohol abuse in his mother; Diabetes in his sister.    ROS:  Please see the history of present illness.   Otherwise, review of systems are positive for none.   All other systems are reviewed and negative.    PHYSICAL EXAM: VS:  BP 96/68 (BP Location: Right Arm, Cuff Size: Normal)   Pulse 99   Ht 5\' 10"  (1.778 m)   Wt 64.9 kg (143 lb)   BMI 20.52 kg/m  , BMI Body mass index is 20.52 kg/m. Affect appropriate Thin black male  HEENT: normal Neck supple with no adenopathy JVP normal no bruits no thyromegaly Lungs clear with no wheezing and good diaphragmatic motion Heart:  S1/S21/6 SEM  murmur, no rub, gallop or click  Port a cath under right clavicle  PMI normal Abdomen: benighn, BS positve, no tenderness, no AAA no bruit.  No HSM or HJR Distal pulses intact with no bruits No edema  Neuro non-focal Skin warm and dry No muscular weakness    EKG:  05/27/16  SR rate 68 normal ECG  11/18/16 SR rate 94 septal Q lead placement otherwise normal    Recent Labs: 07/15/2016: ALT 24 08/25/2016: Hemoglobin 10.8; Platelets 198 10/20/2016: BUN 17; Creat 1.09; Potassium 4.1; Sodium 142; TSH 0.71    Lipid Panel    Component Value Date/Time   CHOL 169 10/20/2016 0917   TRIG 60 10/20/2016 0917   HDL 66 10/20/2016 0917   CHOLHDL 2.6 10/20/2016 0917   VLDL 12 10/20/2016 0917   LDLCALC 91 10/20/2016 0917      Wt Readings from Last 3 Encounters:  11/18/16 64.9 kg (143 lb)  10/27/16 64.9 kg (143 lb)  08/25/16 67.1 kg (148 lb)      Other studies Reviewed: Additional studies/ records that were reviewed today include: CT chest 2017 office notes labs and ECG Dr Moshe Cipro.    ASSESSMENT AND PLAN:  1.  CAD seen on ly on CT. Not unexpected in his age group with smoking Asymptomatic with normal ECG And active. No indication for stress testing unless he is to proceed  with esophageal surgery  2. HTN Well controlled.  Continue current medications and low sodium Dash type diet.   3. HL diet Rx f/u labs with primary  4. Esophageal cancer continue XRT and chemo port working well f/u oncology  5. Smoking  Counseled on cessation no active wheezing f/u primary    Current medicines are reviewed at length with the patient today.  The patient does not have concerns regarding medicines.  The following changes have been made:  no change  Labs/ tests ordered today include: Echo and Myovue   Orders Placed This Encounter  Procedures  . EKG 12-Lead  Disposition:   FU with cardiology PRN      Signed, Jenkins Rouge, MD  11/18/2016 10:49 AM    [address], [place], [place]  [address] Phone: [phone]; Fax: [phone]

## 2016-11-18 ENCOUNTER — Ambulatory Visit (INDEPENDENT_AMBULATORY_CARE_PROVIDER_SITE_OTHER): Payer: Medicaid Other | Admitting: Cardiovascular Disease

## 2016-11-18 ENCOUNTER — Encounter: Payer: Self-pay | Admitting: Cardiovascular Disease

## 2016-11-18 VITALS — BP 96/68 | HR 99 | Ht 70.0 in | Wt 143.0 lb

## 2016-11-18 DIAGNOSIS — I10 Essential (primary) hypertension: Secondary | ICD-10-CM | POA: Diagnosis not present

## 2016-11-18 NOTE — Patient Instructions (Signed)
Your physician wants you to follow-up in: 1 Year with Dr. Nishan. You will receive a reminder letter in the mail two months in advance. If you don't receive a letter, please call our office to schedule the follow-up appointment.  Your physician recommends that you continue on your current medications as directed. Please refer to the Current Medication list given to you today.  If you need a refill on your cardiac medications before your next appointment, please call your pharmacy.  Thank you for choosing [facility]!   

## 2016-11-26 ENCOUNTER — Other Ambulatory Visit (HOSPITAL_COMMUNITY): Payer: Medicaid Other

## 2016-11-26 ENCOUNTER — Ambulatory Visit (HOSPITAL_COMMUNITY): Payer: Medicaid Other

## 2016-11-29 ENCOUNTER — Other Ambulatory Visit (HOSPITAL_COMMUNITY): Payer: Self-pay | Admitting: Oncology

## 2016-11-29 DIAGNOSIS — C159 Malignant neoplasm of esophagus, unspecified: Secondary | ICD-10-CM

## 2016-12-01 ENCOUNTER — Ambulatory Visit (HOSPITAL_COMMUNITY): Payer: Medicaid Other

## 2016-12-02 ENCOUNTER — Telehealth (HOSPITAL_COMMUNITY): Payer: Self-pay | Admitting: Oncology

## 2016-12-02 NOTE — Telephone Encounter (Signed)
Peer-to-peer completed for restaging scans of CT chest and abdomen for esophageal carcinoma.  Scans of been denied by insurance due to surveillance guidelines recommending imaging every 6 months.  PET scan performed in April 2018 did not demonstrate any recurrence of disease.  Therefore, we will reschedule CT imaging for October 2018 for restaging scans.  Robynn Pane, PA-C 12/02/2016 2:39 PM

## 2016-12-08 ENCOUNTER — Ambulatory Visit (HOSPITAL_COMMUNITY): Payer: Medicaid Other

## 2016-12-13 ENCOUNTER — Encounter (HOSPITAL_COMMUNITY): Payer: Medicaid Other | Attending: Oncology | Admitting: Oncology

## 2016-12-13 VITALS — BP 130/68 | HR 69 | Temp 98.0°F | Resp 14 | Wt 146.4 lb

## 2016-12-13 DIAGNOSIS — Z8546 Personal history of malignant neoplasm of prostate: Secondary | ICD-10-CM | POA: Diagnosis not present

## 2016-12-13 DIAGNOSIS — C159 Malignant neoplasm of esophagus, unspecified: Secondary | ICD-10-CM

## 2016-12-13 DIAGNOSIS — F1011 Alcohol abuse, in remission: Secondary | ICD-10-CM

## 2016-12-13 DIAGNOSIS — Z720 Tobacco use: Secondary | ICD-10-CM

## 2016-12-13 NOTE — Progress Notes (Signed)
[facility] Hematology/Oncology Progress Note  Name: Stanley Jimenez      MRN: [id number]   Date: 12/13/2016 Time:10:52 AM   REFERRING PHYSICIAN:  Hildred Laser, MD (GI)  REASON FOR CONSULT:  Newly diagnosed poorly differentiated, invasive squamous cell carcinoma   DIAGNOSIS:  Invasive squamous cell carcinoma of esophagus.  HISTORY OF PRESENT ILLNESS:      Malignant neoplasm of prostate ([place])   04/23/2014 Procedure    [place] and prostate biopsy      04/23/2014 Pathology Results    2/12 cores positive for adenocarcinoma.  Gleason 3+3 = 6 pattern.      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 Laural Golden.      04/16/2016 Pathology Results    Esophagus, biopsy - INVASIVE POORLY DIFFERENTIATED SQUAMOUS CELL CARCINOMA.      04/26/2016 Imaging    CT CAP- 1. Prominent gastroesophageal junction without discrete esophageal mass. Notably, there is also a borderline enlarged enhancing lymph node in the gastrohepatic ligament immediately adjacent to the gastroesophageal junction, which is nonspecific, but could indicate early nodal disease. 2. The small cluster of low-attenuation lesions in segment 7 of the liver are too small to characterize. Statistically, these are likely to represent cysts. If there is clinical concern for metastatic disease to the liver, these could be further characterized with MRI of the abdomen with and without IV gadolinium at this time. 3. No other potential sites of metastatic disease noted elsewhere in the chest, abdomen or pelvis.      05/10/2016 PET scan    Hypermetabolic bilateral lower thoracic esophageal mass, consistent with known primary esophageal carcinoma.  Small hypermetabolic lymph node in the upper gastrohepatic ligament adjacent to GE  junction, consistent with lymph node metastasis.  No other sites of metastatic disease identified.      05/31/2016 Procedure    Port placed by Dr. Rosana Hoes      06/02/2016 - 07/14/2016 Chemotherapy    Weekly Carboplatin/Paclitaxel with XRT.  Tolerated extremely well.       06/02/2016 - 07/14/2016 Radiation Therapy    [place].  Tolerated well.      08/23/2016 PET scan    1. Complete metabolic response. No residual hypermetabolism in the thoracic esophagus or in the upper gastrohepatic ligament lymph node, which is decreased in size. 2. Aortic atherosclerosis. Three-vessel coronary atherosclerosis.      08/24/2016 Miscellaneous    CTS consult with Dr. Servando Snare- "I've recommended to him that surgical resection offers the best chance of cure, if he waits and there is recurrent disease remaining no longer be a surgical candidate. From his discussion today I think he is not inclined to agree but it has been offered."       Stanley Jimenez is a 61 y.o. male who returns for f/u of poorly differentiated, invasive squamous cell carcinoma.  He is doing well today. He has no complaints. Continues to be able to tolerate both solids and liquids without any evidence of dysphagia. Denies any malaise, fatigue, weight loss. Denies loss of appetite, abdominal pain, chest pain, SOB, or any other concerns. Energy level is good. He walks his dog 5-6 times a day. Able to do all his own ADLs.  Review of  Systems  Constitutional: Negative.  Negative for diaphoresis, malaise/fatigue and weight loss.  HENT: Negative.   Eyes: Negative.   Respiratory: Negative.  Negative for shortness of breath.   Cardiovascular: Negative.  Negative for chest pain.  Gastrointestinal: Negative.  Negative for abdominal pain.  Genitourinary: Negative.   Musculoskeletal: Negative.   Skin: Negative.   Neurological: Negative.   Endo/Heme/Allergies: Negative.   Psychiatric/Behavioral: Negative.   All other systems reviewed and are  negative. 14 point review of systems was performed and is negative except as detailed under history of present illness and above  PAST MEDICAL HISTORY:   Past Medical History:  Diagnosis Date  . 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 Anxiety   . Arthritis   . Cirrhosis, alcoholic ([place])   . Clubbing of fingers    congenital  . Depression   . Full dentures   . History of PSVT (paroxysmal supraventricular tachycardia)    run of non-sustatined VT 03-10-2015 in setting of alcohol withdrawal in hospital  . History of seizure    03-08-2012  alcohol withdrawal  . History of thrombocytopenia    10/ 2013  in setting of alcohol withdrawal  . Hyperlipidemia   . Hypertension   . Prostate cancer [facility]) urologist-  dr dalhstedt/  oncologist- dr Tammi Klippel   T1c, Gleason 3+3,  PSA 8.89,  vol 27cc  . 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    Current Outpatient Prescriptions on File Prior to Visit  Medication Sig Dispense Refill  . amLODipine (NORVASC) 10 MG tablet TAKE 1 TABLET BY MOUTH EVERY DAY IN THE MORNING FOR HIGH BLOOD PRESSURE 30 tablet 6  . ondansetron (ZOFRAN) 8 MG tablet Take 8 mg by mouth every 8 (eight) hours as needed for nausea or vomiting.    . pantoprazole (PROTONIX) 40 MG tablet Take 1 tablet (40 mg total) by mouth daily before breakfast. 30 tablet 5  . prochlorperazine (COMPAZINE) 10 MG tablet Take 10 mg by mouth every 6 (six) hours as needed for nausea or vomiting.    . tamsulosin (FLOMAX) 0.4 MG CAPS capsule Take 1 capsule (0.4 mg total) by mouth daily after supper. 30 capsule 5  . triamcinolone cream (KENALOG) 0.1 % Apply 1 application topically 2 (two) times daily. (Patient not taking: Reported on 12/13/2016) 453.6 g 2   No current  facility-administered medications on file prior to visit.      PAST SURGICAL HISTORY Past Surgical History:  Procedure Laterality Date  . BIOPSY  04/14/2016   Procedure: BIOPSY;  Surgeon: Rogene Houston, MD;  [facility];  Service: Endoscopy;;  esophagus  . COLONOSCOPY N/A 04/14/2016   Procedure: COLONOSCOPY;  Surgeon: Rogene Houston, MD;  [facility];  Service: Endoscopy;  Laterality: N/A;  . ESOPHAGOGASTRODUODENOSCOPY N/A 04/14/2016   Procedure: ESOPHAGOGASTRODUODENOSCOPY (EGD);  Surgeon: Rogene Houston, MD;  Location: [facility];  Service: Endoscopy;  Laterality: N/A;  1:00  . NO PAST SURGERIES    . PORTACATH PLACEMENT Right 05/31/2016   Procedure: INSERTION OF TUNNELED RIGHT INTERNAL JUGULAR BARD POWERPORT CENTRAL VENOUS CATHETER WITH SUBCUTANEOUS PORT;  Surgeon: Vickie Epley, MD;  Location: [facility];  Service: Vascular;  Laterality: Right;  . RADIOACTIVE SEED  IMPLANT N/A 09/20/2014   Procedure: RADIOACTIVE SEED IMPLANT;  Surgeon: Franchot Gallo, MD;  Location: [facility];  Service: Urology;  Laterality: N/A;    77   seeds implanted no seeds found in bladder  . TRANSURETHRAL RESECTION OF PROSTATE      FAMILY HISTORY: Family History  Problem Relation Age of Onset  . Alcohol abuse Mother   . Diabetes Sister    Mother is deceased at the age of 65 due to complications of EtOHism. Father is deceased when the patient was 95 years old from unknown cause. He has 3 brothers and 3 sisters.  All are local except for 2 sisters who live in [place].   He has no children.   SOCIAL HISTORY: He reports a 1 ppd smoking history x 20+ years.  He quit drinking EtOH 2 years ago, but admits to a 6 year history of drinking daily both beer and liquor.  He does have a history of illicit abuse with Marijuana, this too he quit 2 years ago.  He works part-time as a Arts administrator.  He notes that he is on disability, but diagnosis for disability is unclear.  He  is Holiness in religion.  He is never married.  No children.  He did not graduate [employer] and he reads a little, but admits he is not the best reader.  He can write.  Social History   Social History  . Marital status: Single    Spouse name: N/A  . Number of children: N/A  . Years of education: N/A   Social History Main Topics  . Smoking status: Current Every Day Smoker    Packs/day: 0.50    Years: 20.00    Types: Cigarettes  . Smokeless tobacco: Former Systems developer    Types: [place] date: 05/27/1984     Comment: smokes 2 cigarettes a day  . Alcohol use Not on file     Comment: quit drinking in October 2015. He says before this he says he stayed drunk every day.   . Drug use: No     Comment: last use october 2015  . Sexual activity: No   Other Topics Concern  . Not on file   Social History Narrative  . No narrative on file    PERFORMANCE STATUS: The patient's performance status is 1 - Symptomatic but completely ambulatory  PHYSICAL EXAM: Vitals:   12/13/16 1016  BP: 130/68  Pulse: 69  Resp: 14  Temp: 98 F (36.7 C)     Physical Exam  Constitutional: He is oriented to person, place, and time and well-developed, well-nourished, and in no distress.  HENT:  Head: Normocephalic and atraumatic.  Mouth/Throat: Oropharynx is clear and moist.  Eyes: Pupils are equal, round, and reactive to light. Conjunctivae and EOM are normal.  Neck: Normal range of motion. Neck supple.  Cardiovascular: Normal rate, regular rhythm and normal heart sounds.   Pulmonary/Chest: Effort normal and breath sounds normal.  Abdominal: Soft. Bowel sounds are normal.  Musculoskeletal: Normal range of motion.  Neurological: He is alert and oriented to person, place, and time. Gait normal.  Skin: Skin is warm and dry.  Nursing note and vitals reviewed.  LABORATORY DATA:  CBC    Component Value Date/Time   WBC 7.1 08/25/2016 0910   RBC 3.50 (L) 08/25/2016 0910   HGB 10.8 (L) 08/25/2016 0910    HCT 32.4 (L) 08/25/2016 0910   PLT 198 08/25/2016 0910   MCV  92.6 08/25/2016 0910   MCH 30.9 08/25/2016 0910   MCHC 33.3 08/25/2016 0910   RDW 19.6 (H) 08/25/2016 0910   LYMPHSABS 1.0 08/25/2016 0910   MONOABS 1.0 08/25/2016 0910   EOSABS 0.1 08/25/2016 0910   BASOSABS 0.1 08/25/2016 0910   CMP Latest Ref Rng & Units 10/20/2016 08/25/2016 08/10/2016  Glucose 65 - 99 mg/dL 90 94 99  BUN 7 - 25 mg/dL 17 20 22(H)  Creatinine 0.70 - 1.33 mg/dL 1.09 1.08 1.08  Sodium 135 - 146 mmol/L 142 136 137  Potassium 3.5 - 5.3 mmol/L 4.1 3.9 3.8  Chloride 98 - 110 mmol/L 108 101 103  CO2 20 - 31 mmol/L 28 26 27   Calcium 8.6 - 10.3 mg/dL 9.2 9.1 9.0  Total Protein 6.5 - 8.1 g/dL - - -  Total Bilirubin 0.3 - 1.2 mg/dL - - -  Alkaline Phos 38 - 126 U/L - - -  AST 15 - 41 U/L - - -  ALT 17 - 63 U/L - - -     Chemistry      Component Value Date/Time   NA 142 10/20/2016 0917   K 4.1 10/20/2016 0917   CL 108 10/20/2016 0917   CO2 28 10/20/2016 0917   BUN 17 10/20/2016 0917   CREATININE 1.09 10/20/2016 0917      Component Value Date/Time   CALCIUM 9.2 10/20/2016 0917   ALKPHOS 62 07/15/2016 0933   AST 16 07/15/2016 0933   ALT 24 07/15/2016 0933   BILITOT 0.5 07/15/2016 0933      RADIOGRAPHY: I have personally reviewed the radiological images as listed and agreed with the findings in the report.  PET Scan 08/20/2016 IMPRESSION: 1. Complete metabolic response. No residual hypermetabolism in the thoracic esophagus or in the upper gastrohepatic ligament lymph node, which is decreased in size. 2. Aortic atherosclerosis.  Three-vessel coronary atherosclerosis.  PATHOLOGY:     ASSESSMENT/PLAN:   Squamous cell carcinoma of esophagus (HCC) H/O alcohol abuse Tobacco Use H/O prostate carcinoma  Invasive squamous cell carcinoma of esophagus, at 33-36 cm from incisors.  S/p chemoRT (06/02/16-07/14/16) with complete response with no residual disease on PET in March 2018.  Clinically NED.  Discussed esophagectomy again with the patient. Reiterated with him that his best chance for cure is surgical resection, however patient continues to refuse.  Continue surveillance. I will get a repeat CT C/A/P prior to the next visit.   RTC in 3 months for follow up and to review CT and lab results.   Orders Placed This Encounter  Procedures  . CT Chest W Contrast    Standing Status:   Future    Standing Expiration Date:   12/13/2017    Order Specific Question:   If indicated for the ordered procedure, I authorize the administration of contrast media per Radiology protocol    Answer:   Yes    Order Specific Question:   Reason for Exam (SYMPTOM  OR DIAGNOSIS REQUIRED)    Answer:   restaging for esophageal cancer    Order Specific Question:   Preferred imaging location?    Answer:   [facility]    Order Specific Question:   Radiology Contrast Protocol - do NOT remove file path    Answer:   \\[email or web address]  . CT Abdomen Pelvis W Contrast    Standing Status:   Future    Standing Expiration Date:   12/13/2017    Order Specific Question:   If indicated for the  ordered procedure, I authorize the administration of contrast media per Radiology protocol    Answer:   Yes    Order Specific Question:   Reason for Exam (SYMPTOM  OR DIAGNOSIS REQUIRED)    Answer:   restaging for esophageal cancer    Order Specific Question:   Preferred imaging location?    Answer:   [facility]    Order Specific Question:   Radiology Contrast Protocol - do NOT remove file path    Answer:   \\[email or web address]  . CBC with Differential    Standing Status:   Future    Standing Expiration Date:   12/13/2017  . Comprehensive metabolic panel    Standing Status:   Future    Standing Expiration Date:   12/13/2017     All questions were answered. The patient knows to call the clinic with any problems, questions or concerns. We can certainly see the  patient much sooner if necessary.    This note is electronically signed by: Twana First, MD  12/13/2016 10:52 AM

## 2017-02-22 ENCOUNTER — Other Ambulatory Visit: Payer: Self-pay | Admitting: Family Medicine

## 2017-03-08 ENCOUNTER — Other Ambulatory Visit (HOSPITAL_COMMUNITY): Payer: Medicaid Other

## 2017-03-09 ENCOUNTER — Encounter (HOSPITAL_COMMUNITY): Payer: Medicaid Other | Attending: Oncology

## 2017-03-09 ENCOUNTER — Ambulatory Visit (HOSPITAL_COMMUNITY)
Admission: RE | Admit: 2017-03-09 | Discharge: 2017-03-09 | Disposition: A | Discharge status: Home / Self Care | Payer: Medicaid Other | Source: Ambulatory Visit | Attending: Oncology | Admitting: Oncology

## 2017-03-09 DIAGNOSIS — C159 Malignant neoplasm of esophagus, unspecified: Secondary | ICD-10-CM | POA: Diagnosis present

## 2017-03-09 DIAGNOSIS — I2510 Atherosclerotic heart disease of native coronary artery without angina pectoris: Secondary | ICD-10-CM | POA: Diagnosis not present

## 2017-03-09 LAB — CBC WITH DIFFERENTIAL/PLATELET
BASOS ABS: 0 10*3/uL (ref 0.0–0.1)
Basophils Relative: 0 %
EOS ABS: 0.1 10*3/uL (ref 0.0–0.7)
Eosinophils Relative: 2 %
HCT: 34 % — ABNORMAL LOW (ref 39.0–52.0)
HEMOGLOBIN: 11.5 g/dL — AB (ref 13.0–17.0)
LYMPHS ABS: 1.5 10*3/uL (ref 0.7–4.0)
Lymphocytes Relative: 21 %
MCH: 30.1 pg (ref 26.0–34.0)
MCHC: 33.8 g/dL (ref 30.0–36.0)
MCV: 89 fL (ref 78.0–100.0)
Monocytes Absolute: 0.7 10*3/uL (ref 0.1–1.0)
Monocytes Relative: 10 %
NEUTROS PCT: 67 %
Neutro Abs: 4.7 10*3/uL (ref 1.7–7.7)
Platelets: 201 10*3/uL (ref 150–400)
RBC: 3.82 MIL/uL — AB (ref 4.22–5.81)
RDW: 15 % (ref 11.5–15.5)
WBC: 7 10*3/uL (ref 4.0–10.5)

## 2017-03-09 LAB — COMPREHENSIVE METABOLIC PANEL
ALBUMIN: 4 g/dL (ref 3.5–5.0)
ALK PHOS: 76 U/L (ref 38–126)
ALT: 18 U/L (ref 17–63)
AST: 18 U/L (ref 15–41)
Anion gap: 8 (ref 5–15)
BUN: 16 mg/dL (ref 6–20)
CALCIUM: 9 mg/dL (ref 8.9–10.3)
CHLORIDE: 104 mmol/L (ref 101–111)
CO2: 26 mmol/L (ref 22–32)
CREATININE: 0.94 mg/dL (ref 0.61–1.24)
GFR calc Af Amer: >60 mL/min (ref >60)
GFR calc non Af Amer: >60 mL/min (ref >60)
GLUCOSE: 86 mg/dL (ref 65–99)
Potassium: 3.9 mmol/L (ref 3.5–5.1)
SODIUM: 138 mmol/L (ref 135–145)
Total Bilirubin: 0.6 mg/dL (ref 0.3–1.2)
Total Protein: 7.7 g/dL (ref 6.5–8.1)

## 2017-03-09 MED ORDER — IOPAMIDOL (ISOVUE-300) INJECTION 61%
100.0000 mL | Freq: Once | INTRAVENOUS | Status: AC | PRN
  Administered 2017-03-09: 100 mL via INTRAVENOUS

## 2017-03-09 MED ORDER — HEPARIN SOD (PORK) LOCK FLUSH 100 UNIT/ML IV SOLN
INTRAVENOUS | Status: AC
  Filled 2017-03-09: qty 5

## 2017-03-10 ENCOUNTER — Other Ambulatory Visit (HOSPITAL_COMMUNITY): Payer: Self-pay | Admitting: Oncology

## 2017-03-10 DIAGNOSIS — C159 Malignant neoplasm of esophagus, unspecified: Secondary | ICD-10-CM

## 2017-03-10 DIAGNOSIS — C61 Malignant neoplasm of prostate: Secondary | ICD-10-CM

## 2017-03-10 NOTE — Progress Notes (Signed)
Stanley Jimenez can you call the patient and let him know his CT shows Mild distal esophageal and GE junction thickening, which appears slightly more prominent. Therefore I have ordered a PET scan per radiology recommendations for further evaluation. Can we get this scheduled asap? Then move his appt to 1-2 days after he gets the PET done? TY

## 2017-03-11 ENCOUNTER — Ambulatory Visit (HOSPITAL_COMMUNITY): Payer: Medicaid Other

## 2017-03-15 ENCOUNTER — Ambulatory Visit (HOSPITAL_COMMUNITY): Payer: Medicaid Other

## 2017-03-21 ENCOUNTER — Ambulatory Visit (HOSPITAL_COMMUNITY): Payer: Medicaid Other

## 2017-03-23 ENCOUNTER — Ambulatory Visit (HOSPITAL_COMMUNITY): Payer: Medicaid Other

## 2017-03-23 ENCOUNTER — Other Ambulatory Visit: Payer: Self-pay | Admitting: Family Medicine

## 2017-03-30 ENCOUNTER — Ambulatory Visit (HOSPITAL_COMMUNITY): Payer: Medicaid Other

## 2017-03-31 ENCOUNTER — Other Ambulatory Visit (HOSPITAL_COMMUNITY): Payer: Self-pay | Admitting: Oncology

## 2017-03-31 DIAGNOSIS — C159 Malignant neoplasm of esophagus, unspecified: Secondary | ICD-10-CM

## 2017-04-01 ENCOUNTER — Ambulatory Visit (HOSPITAL_COMMUNITY): Payer: Medicaid Other | Admitting: Oncology

## 2017-04-27 ENCOUNTER — Other Ambulatory Visit: Payer: Self-pay | Admitting: Family Medicine

## 2017-04-27 ENCOUNTER — Ambulatory Visit (INDEPENDENT_AMBULATORY_CARE_PROVIDER_SITE_OTHER): Payer: Medicaid Other | Admitting: Family Medicine

## 2017-04-27 ENCOUNTER — Encounter: Payer: Self-pay | Admitting: Family Medicine

## 2017-04-27 VITALS — BP 108/68 | HR 76 | Resp 16 | Ht 70.0 in | Wt 150.0 lb

## 2017-04-27 DIAGNOSIS — Z0000 Encounter for general adult medical examination without abnormal findings: Secondary | ICD-10-CM | POA: Diagnosis not present

## 2017-04-27 DIAGNOSIS — C61 Malignant neoplasm of prostate: Secondary | ICD-10-CM

## 2017-04-27 DIAGNOSIS — Z23 Encounter for immunization: Secondary | ICD-10-CM

## 2017-04-27 DIAGNOSIS — Z1211 Encounter for screening for malignant neoplasm of colon: Secondary | ICD-10-CM

## 2017-04-27 DIAGNOSIS — F17200 Nicotine dependence, unspecified, uncomplicated: Secondary | ICD-10-CM

## 2017-04-27 LAB — POC HEMOCCULT BLD/STL (OFFICE/1-CARD/DIAGNOSTIC): FECAL OCCULT BLD: NEGATIVE

## 2017-04-27 MED ORDER — AMLODIPINE BESYLATE 10 MG PO TABS: ORAL_TABLET | ORAL | 1 refills | Status: DC

## 2017-04-27 MED ORDER — TAMSULOSIN HCL 0.4 MG PO CAPS: 0.4000 mg | ORAL_CAPSULE | Freq: Every day | ORAL | 1 refills | Status: DC

## 2017-04-27 NOTE — Patient Instructions (Addendum)
F/u in 6 month, call if you need me before  Cut back to 5 cigarettes per day, this week, then 3 cigarettes per day , then QUIT by Christmas Day  Flu vaccine today  Rectal exam today      Steps to Quit Smoking Smoking tobacco can be bad for your health. It can also affect almost every organ in your body. Smoking puts you and people around you at risk for many serious long-lasting (chronic) diseases. Quitting smoking is hard, but it is one of the best things that you can do for your health. It is never too late to quit. What are the benefits of quitting smoking? When you quit smoking, you lower your risk for getting serious diseases and conditions. They can include:  Lung cancer or lung disease.  Heart disease.  Stroke.  Heart attack.  Not being able to have children (infertility).  Weak bones (osteoporosis) and broken bones (fractures).  If you have coughing, wheezing, and shortness of breath, those symptoms may get better when you quit. You may also get sick less often. If you are pregnant, quitting smoking can help to lower your chances of having a baby of low birth weight. What can I do to help me quit smoking? Talk with your doctor about what can help you quit smoking. Some things you can do (strategies) include:  Quitting smoking totally, instead of slowly cutting back how much you smoke over a period of time.  Going to in-person counseling. You are more likely to quit if you go to many counseling sessions.  Using resources and support systems, such as: ? Database administrator with a Social worker. ? Phone quitlines. ? Careers information officer. ? Support groups or group counseling. ? Text messaging programs. ? Mobile phone apps or applications.  Taking medicines. Some of these medicines may have nicotine in them. If you are pregnant or breastfeeding, do not take any medicines to quit smoking unless your doctor says it is okay. Talk with your doctor about counseling or other  things that can help you.  Talk with your doctor about using more than one strategy at the same time, such as taking medicines while you are also going to in-person counseling. This can help make quitting easier. What things can I do to make it easier to quit? Quitting smoking might feel very hard at first, but there is a lot that you can do to make it easier. Take these steps:  Talk to your family and friends. Ask them to support and encourage you.  Call phone quitlines, reach out to support groups, or work with a Social worker.  Ask people who smoke to not smoke around you.  Avoid places that make you want (trigger) to smoke, such as: ? Bars. ? Parties. ? Smoke-break areas at work.  Spend time with people who do not smoke.  Lower the stress in your life. Stress can make you want to smoke. Try these things to help your stress: ? Getting regular exercise. ? Deep-breathing exercises. ? Yoga. ? Meditating. ? Doing a body scan. To do this, close your eyes, focus on one area of your body at a time from head to toe, and notice which parts of your body are tense. Try to relax the muscles in those areas.  Download or buy apps on your mobile phone or tablet that can help you stick to your quit plan. There are many free apps, such as QuitGuide from the [employer] Office manager for Disease Control and Prevention).  You can find more support from smokefree.gov and other websites.  This information is not intended to replace advice given to you by your health care provider. Make sure you discuss any questions you have with your health care provider. Document Released: 03/06/2009 Document Revised: 01/06/2016 Document Reviewed: 09/24/2014 Elsevier Interactive Patient Education  2018 [employer].

## 2017-04-28 ENCOUNTER — Ambulatory Visit (HOSPITAL_COMMUNITY)
Admission: RE | Admit: 2017-04-28 | Discharge: 2017-04-28 | Disposition: A | Discharge status: Home / Self Care | Payer: Medicaid Other | Source: Ambulatory Visit | Attending: Oncology | Admitting: Oncology

## 2017-04-28 DIAGNOSIS — C159 Malignant neoplasm of esophagus, unspecified: Secondary | ICD-10-CM | POA: Diagnosis present

## 2017-04-28 DIAGNOSIS — Z8501 Personal history of malignant neoplasm of esophagus: Secondary | ICD-10-CM | POA: Insufficient documentation

## 2017-04-28 DIAGNOSIS — Z9889 Other specified postprocedural states: Secondary | ICD-10-CM | POA: Diagnosis not present

## 2017-04-28 LAB — GLUCOSE, CAPILLARY: GLUCOSE-CAPILLARY: 79 mg/dL (ref 65–99)

## 2017-04-28 MED ORDER — FLUDEOXYGLUCOSE F - 18 (FDG) INJECTION
7.4800 | Freq: Once | INTRAVENOUS | Status: AC | PRN
  Administered 2017-04-28: 7.48 via INTRAVENOUS

## 2017-04-29 ENCOUNTER — Encounter: Payer: Self-pay | Admitting: Family Medicine

## 2017-04-29 NOTE — Assessment & Plan Note (Signed)
Annual exam as documented. . Immunization and cancer screening needs are specifically addressed at this visit.  

## 2017-04-29 NOTE — Assessment & Plan Note (Signed)

## 2017-04-29 NOTE — Progress Notes (Signed)
   Stanley Jimenez     MRN: [id number]      DOB: [birth year]   HPI: Patient is in for annual physical exam. No other health concerns are expressed or addressed at the visit. Recent labs, if available are reviewed. Immunization is reviewed , and  updated if needed.    PE; BP 108/68   Pulse 76   Resp 16   Ht 5\' 10"  (1.778 m)   Wt 150 lb (68 kg)   SpO2 97%   BMI 21.52 kg/m   Pleasant male, alert and oriented x 3, in no cardio-pulmonary distress. Afebrile. HEENT No facial trauma or asymetry. Sinuses non tender. EOMI, . External ears normal, tympanic membranes clear. Oropharynx moist, no exudate. Neck: supple, no adenopathy,JVD or thyromegaly.No bruits.  Chest: Clear to ascultation bilaterally.No crackles or wheezes. Non tender to palpation  Breast: No asymetry,no masses. No nipple discharge or inversion. No axillary or supraclavicular adenopathy  Cardiovascular system; Heart sounds normal,  S1 and  S2 ,no S3.  No murmur, or thrill. Apical beat not displaced Peripheral pulses normal.  Abdomen: Soft, non tender, no organomegaly or masses. No bruits. Bowel sounds normal. No guarding, tenderness or rebound.  Rectal:  Normal sphincter tone. No hemorrhoids or  masses. guaiac negative stool. Prostate smooth and firm    Musculoskeletal exam: Full ROM of spine, hips , shoulders and knees. No deformity ,swelling or crepitus noted. No muscle wasting or atrophy.   Neurologic: Cranial nerves 2 to 12 intact. Power, tone ,sensation and reflexes normal throughout. No disturbance in gait. No tremor.  Skin: Intact, no ulceration, erythema , scaling or rash noted.Hyperpigmented macular lesions noted on extremities  Pigmentation normal throughout Psych; Normal mood and affect. Judgement and concentration normal   Assessment & Plan:  Annual physical exam Annual exam as documented.  Immunization and cancer screening needs are specifically addressed at this  visit.   NICOTINE ADDICTION Patient is asked and  confirms current  Nicotine use.  Five to seven minutes of time is spent in counseling the patient of the need to quit smoking  Advice to quit is delivered clearly specifically in reducing the risk of developing heart disease, having a stroke, or of developing all types of cancer, especially lung and oral cancer. Improvement in breathing and exercise tolerance and quality of life is also discussed, as is the economic benefit.  Assessment of willingness to quit or to make an attempt to quit is made and documented  Assistance in quit attempt is made with several and varied options presented, based on patient's desire and need. These include  literature, local classes available, 1800 QUIT NOW number, OTC and prescription medication.  The GOAL to be NICOTINE FREE is re emphasized.  The patient has set a personal goal of either reduction or discontinuation and follow up is arranged between 6 an 16 weeks.

## 2017-05-03 DIAGNOSIS — Z23 Encounter for immunization: Secondary | ICD-10-CM | POA: Diagnosis not present

## 2017-05-04 ENCOUNTER — Encounter (HOSPITAL_COMMUNITY): Payer: Self-pay | Admitting: Oncology

## 2017-05-04 ENCOUNTER — Encounter (HOSPITAL_COMMUNITY): Payer: Medicaid Other | Attending: Oncology | Admitting: Oncology

## 2017-05-04 VITALS — BP 124/76 | HR 99 | Temp 97.6°F | Wt 152.0 lb

## 2017-05-04 DIAGNOSIS — C159 Malignant neoplasm of esophagus, unspecified: Secondary | ICD-10-CM

## 2017-05-04 DIAGNOSIS — Z8546 Personal history of malignant neoplasm of prostate: Secondary | ICD-10-CM

## 2017-05-04 DIAGNOSIS — Z720 Tobacco use: Secondary | ICD-10-CM | POA: Diagnosis not present

## 2017-05-04 NOTE — Progress Notes (Signed)
[facility] Hematology/Oncology Progress Note  Name: Stanley Jimenez      MRN: [id number]   Date: 05/04/2017 Time:2:27 PM   REFERRING PHYSICIAN:  Hildred Laser, MD (GI)  REASON FOR CONSULT:  Newly diagnosed poorly differentiated, invasive squamous cell carcinoma   DIAGNOSIS:  Invasive squamous cell carcinoma of esophagus.  HISTORY OF PRESENT ILLNESS:      Malignant neoplasm of prostate ([place])   04/23/2014 Procedure    [place] and prostate biopsy      04/23/2014 Pathology Results    2/12 cores positive for adenocarcinoma.  Gleason 3+3 = 6 pattern.      08/14/2014 Initial Diagnosis    Malignant neoplasm of prostate [facility])       - 09/20/2014 Radiation Therapy    Brachytherapy with I-125       Squamous cell carcinoma of esophagus ([place])   04/14/2016 Procedure    EGD by Dr. Laural Golden.      04/16/2016 Pathology Results    Esophagus, biopsy - INVASIVE POORLY DIFFERENTIATED SQUAMOUS CELL CARCINOMA.      04/26/2016 Imaging    CT CAP- 1. Prominent gastroesophageal junction without discrete esophageal mass. Notably, there is also a borderline enlarged enhancing lymph node in the gastrohepatic ligament immediately adjacent to the gastroesophageal junction, which is nonspecific, but could indicate early nodal disease. 2. The small cluster of low-attenuation lesions in segment 7 of the liver are too small to characterize. Statistically, these are likely to represent cysts. If there is clinical concern for metastatic disease to the liver, these could be further characterized with MRI of the abdomen with and without IV gadolinium at this time. 3. No other potential sites of metastatic disease noted elsewhere in the chest, abdomen or pelvis.      05/10/2016 PET scan    Hypermetabolic bilateral lower thoracic esophageal mass, consistent with known primary esophageal carcinoma.  Small hypermetabolic lymph node in the upper gastrohepatic ligament adjacent to GE  junction, consistent with lymph node metastasis.  No other sites of metastatic disease identified.      05/31/2016 Procedure    Port placed by Dr. Rosana Hoes      06/02/2016 - 07/14/2016 Chemotherapy    Weekly Carboplatin/Paclitaxel with XRT.  Tolerated extremely well.       06/02/2016 - 07/14/2016 Radiation Therapy    [place], .  Tolerated well.      08/23/2016 PET scan    1. Complete metabolic response. No residual hypermetabolism in the thoracic esophagus or in the upper gastrohepatic ligament lymph node, which is decreased in size. 2. Aortic atherosclerosis. Three-vessel coronary atherosclerosis.      08/24/2016 Miscellaneous    CTS consult with Dr. Servando Snare- "I've recommended to him that surgical resection offers the best chance of cure, if he waits and there is recurrent disease remaining no longer be a surgical candidate. From his discussion today I think he is not inclined to agree but it has been offered."      03/09/2017 Imaging    CT CAP: 1. Mild distal esophageal and GE junction thickening, which appears slightly more prominent compared with PET-CT 08/20/2016. Correlation with PET-CT suggested. 2. Negative for pulmonary nodules or metastatic disease to the chest. 3. Stable subcentimeter posterior right hepatic lobe hypodensities. No definite evidence for metastatic disease to the abdomen or pelvis      04/28/2017 PET scan    No evidence of recurrent or metastatic carcinoma.       Stanley Jimenez  is a 61 y.o. male who returns for f/u of poorly differentiated, invasive squamous cell carcinoma cTx cN1 cM0.  Patient presents today for continued follow-up and review of his restaging scans.  He states that overall he has been doing well.  He is not been having any symptoms of dysphagia.  He states he only gets hiccups if he eats too fast.  He denies any weight loss, he is actually been gaining weight and appetite is good.  He denies any chest pain, shortness of breath,  abdominal pain, nausea, vomiting, diarrhea, focal weakness.  Review of Systems  Constitutional: Negative.  Negative for diaphoresis, malaise/fatigue and weight loss.  HENT: Negative.   Eyes: Negative.   Respiratory: Negative.  Negative for shortness of breath.   Cardiovascular: Negative.  Negative for chest pain.  Gastrointestinal: Negative.  Negative for abdominal pain.  Genitourinary: Negative.   Musculoskeletal: Negative.   Skin: Negative.   Neurological: Negative.   Endo/Heme/Allergies: Negative.   Psychiatric/Behavioral: Negative.   All other systems reviewed and are negative. 14 point review of systems was performed and is negative except as detailed under history of present illness and above  PAST MEDICAL HISTORY:   Past Medical History:  Diagnosis Date  . 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 Anxiety   . Arthritis   . Cirrhosis, alcoholic ([place])   . Clubbing of fingers    congenital  . Depression   . Full dentures   . History of PSVT (paroxysmal supraventricular tachycardia)    run of non-sustatined VT 03-10-2015 in setting of alcohol withdrawal in hospital  . History of seizure    03-08-2012  alcohol withdrawal  . History of thrombocytopenia    10/ 2013  in setting of alcohol withdrawal  . Hyperlipidemia   . Hypertension   . Prostate cancer [facility]) urologist-  dr dalhstedt/  oncologist- dr Tammi Klippel   T1c, Gleason 3+3,  PSA 8.89,  vol 27cc  . 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 Squamous cell carcinoma of esophagus ([place]) 04/21/2016    ALLERGIES: No Known Allergies    MEDICATIONS: I have reviewed the patient's current medications.    Current Outpatient Medications on File Prior to Visit  Medication Sig Dispense Refill  . amLODipine (NORVASC) 10 MG tablet TAKE 1 TABLET BY MOUTH EVERY DAY IN THE MORNING FOR HIGH BLOOD PRESSURE 90 tablet 1  . pantoprazole (PROTONIX) 40 MG  tablet Take 1 tablet (40 mg total) by mouth daily before breakfast. (Patient not taking: Reported on 04/27/2017) 30 tablet 5  . prochlorperazine (COMPAZINE) 10 MG tablet Take 10 mg by mouth every 6 (six) hours as needed for nausea or vomiting.    . tamsulosin (FLOMAX) 0.4 MG CAPS capsule Take 1 capsule (0.4 mg total) by mouth daily after supper. 90 capsule 1  . triamcinolone cream (KENALOG) 0.1 % Apply 1 application topically 2 (two) times daily. (Patient not taking: Reported on 12/13/2016) 453.6 g 2   No current facility-administered medications on file prior to visit.      PAST SURGICAL HISTORY Past Surgical History:  Procedure Laterality Date  . BIOPSY  04/14/2016   Procedure: BIOPSY;  Surgeon: Rogene Houston, MD;  [facility];  Service: Endoscopy;;  esophagus  . COLONOSCOPY N/A 04/14/2016   Procedure: COLONOSCOPY;  Surgeon: Rogene Houston, MD;  Location: [facility];  Service: Endoscopy;  Laterality: N/A;  . ESOPHAGOGASTRODUODENOSCOPY N/A 04/14/2016   Procedure: ESOPHAGOGASTRODUODENOSCOPY (EGD);  Surgeon: Rogene Houston, MD;  Location: [facility];  Service: Endoscopy;  Laterality: N/A;  1:00  . NO PAST SURGERIES    . PORTACATH PLACEMENT Right 05/31/2016   Procedure: INSERTION OF TUNNELED RIGHT INTERNAL JUGULAR BARD POWERPORT CENTRAL VENOUS CATHETER WITH SUBCUTANEOUS PORT;  Surgeon: Vickie Epley, MD;  Location: [facility];  Service: Vascular;  Laterality: Right;  . RADIOACTIVE SEED IMPLANT N/A 09/20/2014   Procedure: RADIOACTIVE SEED IMPLANT;  Surgeon: Franchot Gallo, MD;  Location: [facility];  Service: Urology;  Laterality: N/A;    77   seeds implanted no seeds found in bladder  . TRANSURETHRAL RESECTION OF PROSTATE      FAMILY HISTORY: Family History  Problem Relation Age of Onset  . Alcohol abuse Mother   . Diabetes Sister    Mother is deceased at the age of 67 due to complications of EtOHism. Father is deceased when the patient was 7 years  old from unknown cause. He has 3 brothers and 3 sisters.  All are local except for 2 sisters who live in [place].   He has no children.   SOCIAL HISTORY: He reports a 1 ppd smoking history x 20+ years.  He quit drinking EtOH 2 years ago, but admits to a 6 year history of drinking daily both beer and liquor.  He does have a history of illicit abuse with Marijuana, this too he quit 2 years ago.  He works part-time as a Arts administrator.  He notes that he is on disability, but diagnosis for disability is unclear.  He is Holiness in religion.  He is never married.  No children.  He did not graduate [employer] and he reads a little, but admits he is not the best reader.  He can write.  Social History   Socioeconomic History  . Marital status: Single    Spouse name: Not on file  . Number of children: Not on file  . Years of education: Not on file  . Highest education level: Not on file  Social Needs  . Financial resource strain: Not on file  . Food insecurity - worry: Not on file  . Food insecurity - inability: Not on file  . Transportation needs - medical: Not on file  . Transportation needs - non-medical: Not on file  Occupational History  . Not on file  Tobacco Use  . Smoking status: Current Every Day Smoker    Packs/day: 0.50    Years: 20.00    Pack years: 10.00    Types: Cigarettes  . Smokeless tobacco: Former Systems developer    Types: Chew    Quit date: 05/27/1984  . Tobacco comment: smokes 2 cigarettes a day  Substance and Sexual Activity  . Alcohol use: Not on file    Comment: quit drinking in October 2015. He says before this he says he stayed drunk every day.   . Drug use: No    Comment: last use october 2015  . Sexual activity: No    Birth control/protection: None  Other Topics Concern  . Not on file  Social History Narrative  . Not on file    PERFORMANCE STATUS: The patient's performance status is 1 - Symptomatic but completely ambulatory  PHYSICAL EXAM: Vitals:   05/04/17  1426  BP: 124/76  Pulse: 99  Temp: 97.6 F (36.4 C)  SpO2: 100%     Physical Exam  Constitutional:  He is oriented to person, place, and time and well-developed, well-nourished, and in no distress.  HENT:  Head: Normocephalic and atraumatic.  Mouth/Throat: Oropharynx is clear and moist.  Eyes: Conjunctivae and EOM are normal. Pupils are equal, round, and reactive to light.  Neck: Normal range of motion. Neck supple.  Cardiovascular: Normal rate, regular rhythm and normal heart sounds.  Pulmonary/Chest: Effort normal and breath sounds normal.  Abdominal: Soft. Bowel sounds are normal.  Musculoskeletal: Normal range of motion.  Neurological: He is alert and oriented to person, place, and time. Gait normal.  Skin: Skin is warm and dry.  Nursing note and vitals reviewed.  LABORATORY DATA:  CBC    Component Value Date/Time   WBC 7.0 03/09/2017 1039   RBC 3.82 (L) 03/09/2017 1039   HGB 11.5 (L) 03/09/2017 1039   HCT 34.0 (L) 03/09/2017 1039   PLT 201 03/09/2017 1039   MCV 89.0 03/09/2017 1039   MCH 30.1 03/09/2017 1039   MCHC 33.8 03/09/2017 1039   RDW 15.0 03/09/2017 1039   LYMPHSABS 1.5 03/09/2017 1039   MONOABS 0.7 03/09/2017 1039   EOSABS 0.1 03/09/2017 1039   BASOSABS 0.0 03/09/2017 1039   CMP Latest Ref Rng & Units 03/09/2017 10/20/2016 08/25/2016  Glucose 65 - 99 mg/dL 86 90 94  BUN 6 - 20 mg/dL 16 17 20   Creatinine 0.61 - 1.24 mg/dL 0.94 1.09 1.08  Sodium 135 - 145 mmol/L 138 142 136  Potassium 3.5 - 5.1 mmol/L 3.9 4.1 3.9  Chloride 101 - 111 mmol/L 104 108 101  CO2 22 - 32 mmol/L 26 28 26   Calcium 8.9 - 10.3 mg/dL 9.0 9.2 9.1  Total Protein 6.5 - 8.1 g/dL 7.7 - -  Total Bilirubin 0.3 - 1.2 mg/dL 0.6 - -  Alkaline Phos 38 - 126 U/L 76 - -  AST 15 - 41 U/L 18 - -  ALT 17 - 63 U/L 18 - -     Chemistry      Component Value Date/Time   NA 138 03/09/2017 1039   K 3.9 03/09/2017 1039   CL 104 03/09/2017 1039   CO2 26 03/09/2017 1039   BUN 16 03/09/2017 1039    CREATININE 0.94 03/09/2017 1039   CREATININE 1.09 10/20/2016 0917      Component Value Date/Time   CALCIUM 9.0 03/09/2017 1039   ALKPHOS 76 03/09/2017 1039   AST 18 03/09/2017 1039   ALT 18 03/09/2017 1039   BILITOT 0.6 03/09/2017 1039      RADIOGRAPHY: I have personally reviewed the radiological images as listed and agreed with the findings in the report.  PET Scan 08/20/2016 IMPRESSION: 1. Complete metabolic response. No residual hypermetabolism in the thoracic esophagus or in the upper gastrohepatic ligament lymph node, which is decreased in size. 2. Aortic atherosclerosis.  Three-vessel coronary atherosclerosis.  PATHOLOGY:     ASSESSMENT/PLAN:   Squamous cell carcinoma of esophagus (HCC) H/O alcohol abuse Tobacco Use H/O prostate carcinoma  Invasive squamous cell carcinoma of esophagus, at 33-36 cm from incisors, cTx cN1 cM0.  S/p chemoRT (06/02/16-07/14/16) with complete response with no residual disease on PET in March 2018. Patient refused esophagectomy.   Clinically NED. Reviewed his CT scans as well as PET scan with him; he has no evidence of recurrent or residual disease/.  Continue surveillance. I will get a repeat CT C/A/P prior to the next visit.   RTC in 6 months for follow up and to review CT and lab results.  Orders Placed This Encounter  Procedures  . CT Chest W Contrast    Standing Status:   Future    Standing Expiration Date:   05/04/2018    Order Specific Question:   If indicated for the ordered procedure, I authorize the administration of contrast media per Radiology protocol    Answer:   Yes    Order Specific Question:   Preferred imaging location?    Answer:   [facility]    Order Specific Question:   Radiology Contrast Protocol - do NOT remove file path    Answer:   [email or web address]  . CT Abdomen Pelvis W Contrast    Standing Status:   Future    Standing Expiration Date:   05/04/2018    Order  Specific Question:   If indicated for the ordered procedure, I authorize the administration of contrast media per Radiology protocol    Answer:   Yes    Order Specific Question:   Preferred imaging location?    Answer:   [facility]    Order Specific Question:   Radiology Contrast Protocol - do NOT remove file path    Answer:   [email or web address]  . Comprehensive metabolic panel    Standing Status:   Future    Standing Expiration Date:   05/04/2018  . CBC with Differential    Standing Status:   Future    Standing Expiration Date:   05/04/2018     All questions were answered. The patient knows to call the clinic with any problems, questions or concerns. We can certainly see the patient much sooner if necessary.    This note is electronically signed by: Twana First, MD  05/04/2017 2:27 PM

## 2017-05-04 NOTE — Patient Instructions (Signed)
[facility] at [facility]  Discharge Instructions:  You were seen by Dr. Zhou today. _______________________________________________________________  Thank you for choosing [facility] at [facility] to provide your oncology and hematology care.  To afford each patient quality time with our providers, please arrive at least 15 minutes before your scheduled appointment.  You need to re-schedule your appointment if you arrive 10 or more minutes late.  We strive to give you quality time with our providers, and arriving late affects you and other patients whose appointments are after yours.  Also, if you no show three or more times for appointments you may be dismissed from the clinic.  Again, thank you for choosing [facility] at [facility]. Our hope is that these requests will allow you access to exceptional care and in a timely manner. _______________________________________________________________  If you have questions after your visit, please contact our office at [phone] between the hours of 8:30 a.m. and 5:00 p.m. Voicemails left after 4:30 p.m. will not be returned until the following business day. _______________________________________________________________  For prescription refill requests, have your pharmacy contact our office. _______________________________________________________________  Recommendations made by the consultant and any test results will be sent to your referring physician. _______________________________________________________________ 

## 2017-05-10 ENCOUNTER — Ambulatory Visit: Payer: Medicaid Other | Admitting: Urology

## 2017-10-26 ENCOUNTER — Encounter: Payer: Self-pay | Admitting: Family Medicine

## 2017-10-26 ENCOUNTER — Ambulatory Visit (INDEPENDENT_AMBULATORY_CARE_PROVIDER_SITE_OTHER): Payer: Medicaid Other | Admitting: Family Medicine

## 2017-10-26 VITALS — BP 120/78 | HR 78 | Resp 16 | Ht 70.0 in | Wt 149.0 lb

## 2017-10-26 DIAGNOSIS — I10 Essential (primary) hypertension: Secondary | ICD-10-CM | POA: Diagnosis not present

## 2017-10-26 DIAGNOSIS — E559 Vitamin D deficiency, unspecified: Secondary | ICD-10-CM

## 2017-10-26 DIAGNOSIS — C61 Malignant neoplasm of prostate: Secondary | ICD-10-CM

## 2017-10-26 DIAGNOSIS — F17200 Nicotine dependence, unspecified, uncomplicated: Secondary | ICD-10-CM

## 2017-10-26 DIAGNOSIS — Z8546 Personal history of malignant neoplasm of prostate: Secondary | ICD-10-CM

## 2017-10-26 DIAGNOSIS — F1021 Alcohol dependence, in remission: Secondary | ICD-10-CM

## 2017-10-26 DIAGNOSIS — L404 Guttate psoriasis: Secondary | ICD-10-CM

## 2017-10-26 MED ORDER — NICOTINE 10 MG IN INHA: 1.0000 | RESPIRATORY_TRACT | 0 refills | Status: DC | PRN

## 2017-10-26 NOTE — Patient Instructions (Signed)
Fasting lipid, cmp and EGFR, tSH and vit D next week  MD follow up in 4 months  I wil;l check and see when next you need to go to urology and get back with you  Good you are ready to quit smoking , call the number, will check to see if medication is covered to help[ with this and let you know

## 2017-10-26 NOTE — Assessment & Plan Note (Signed)
Asked: confirms 7 cigarettes/ day ASSESS: Wants to quit Assist: given 1800 QUIT NOW # and also quit smoke counseling tips an written ttips provided, nicotrol inhaler prescribed  Also Advise: needs to quit already has dx of 2 cancers Arrange f/u in 8 to 12 weeks Time spent 5 mins

## 2017-10-26 NOTE — Assessment & Plan Note (Signed)
Reports that he remains alcohol free in 09/2017

## 2017-10-26 NOTE — Assessment & Plan Note (Signed)
Needs f/u with urology, referral entered

## 2017-10-26 NOTE — Assessment & Plan Note (Signed)
No flare currently , skin exam is  normal

## 2017-10-26 NOTE — Assessment & Plan Note (Signed)
Controlled, no change in medication DASH diet and commitment to daily physical activity for a minimum of 30 minutes discussed and encouraged, as a part of hypertension management. The importance of attaining a healthy weight is also discussed.  BP/Weight 10/26/2017 05/04/2017 04/27/2017 12/13/2016 11/18/2016 1/0/2111 11/23/5668  Systolic BP 141 030 131 438 96 887 579  Diastolic BP 78 76 68 68 68 64 68  Wt. (Lbs) 149 152 150 146.4 143 143 148  BMI 21.38 21.81 21.52 21.01 20.52 20.52 21.24

## 2017-10-26 NOTE — Progress Notes (Signed)
   Stanley Jimenez     MRN: [id number]      DOB: Apr 18, 1956   HPI [name] is here for follow up and re-evaluation of chronic medical conditions, medication management and review of any available recent lab and radiology data.  Preventive health is updated, specifically  Cancer screening and Immunization.   Has upcoming appt with oncology. Needs appt scheduled with urology. The PT denies any adverse reactions to current medications since the last visit.  Wants to quit smoking, now smokes 7 cigarettes/ day, does not drink alcohol   ROS Denies recent fever or chills. Denies sinus pressure, nasal congestion, ear pain or sore throat. Denies chest congestion, productive cough or wheezing. Denies chest pains, palpitations and leg swelling Denies abdominal pain, nausea, vomiting,diarrhea or constipation.   Denies dysuria, frequency, hesitancy or incontinence. Denies joint pain, swelling and limitation in mobility. Denies headaches, seizures, numbness, or tingling. Denies depression, anxiety or insomnia. Denies skin break down or rash.   PE  BP 120/78   Pulse 78   Resp 16   Ht 5\' 10"  (1.778 m)   Wt 149 lb (67.6 kg)   SpO2 98%   BMI 21.38 kg/m   Patient alert and oriented and in no cardiopulmonary distress.  HEENT: No facial asymmetry, EOMI,   oropharynx pink and moist.  Neck supple no JVD, no mass.  Chest: Clear to auscultation bilaterally.  CVS: S1, S2 no murmurs, no S3.Regular rate.  ABD: Soft non tender.   Ext: No edema  MS: Adequate ROM spine, shoulders, hips and knees.  Skin: Intact, no ulcerations or rash noted.  Psych: Good eye contact, normal affect. Memory intact not anxious or depressed appearing.  CNS: CN 2-12 intact, power,  normal throughout.no focal deficits noted.   Assessment & Plan  Essential hypertension Controlled, no change in medication DASH diet and commitment to daily physical activity for a minimum of 30 minutes discussed and encouraged,  as a part of hypertension management. The importance of attaining a healthy weight is also discussed.  BP/Weight 10/26/2017 05/04/2017 04/27/2017 12/13/2016 11/18/2016 01/26/6386 09/27/4330  Systolic BP 951 884 166 063 96 016 010  Diastolic BP 78 76 68 68 68 64 68  Wt. 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

## 2017-11-01 ENCOUNTER — Other Ambulatory Visit (HOSPITAL_COMMUNITY): Payer: Self-pay | Admitting: *Deleted

## 2017-11-01 DIAGNOSIS — C61 Malignant neoplasm of prostate: Secondary | ICD-10-CM

## 2017-11-02 ENCOUNTER — Inpatient Hospital Stay (HOSPITAL_COMMUNITY): Payer: Medicaid Other | Attending: Hematology

## 2017-11-02 DIAGNOSIS — F17210 Nicotine dependence, cigarettes, uncomplicated: Secondary | ICD-10-CM | POA: Diagnosis not present

## 2017-11-02 DIAGNOSIS — Z9221 Personal history of antineoplastic chemotherapy: Secondary | ICD-10-CM | POA: Insufficient documentation

## 2017-11-02 DIAGNOSIS — Z8501 Personal history of malignant neoplasm of esophagus: Secondary | ICD-10-CM | POA: Insufficient documentation

## 2017-11-02 DIAGNOSIS — C61 Malignant neoplasm of prostate: Secondary | ICD-10-CM

## 2017-11-02 DIAGNOSIS — Z8546 Personal history of malignant neoplasm of prostate: Secondary | ICD-10-CM | POA: Diagnosis not present

## 2017-11-02 DIAGNOSIS — Z923 Personal history of irradiation: Secondary | ICD-10-CM | POA: Insufficient documentation

## 2017-11-02 LAB — CBC WITH DIFFERENTIAL/PLATELET
Basophils Absolute: 0 10*3/uL (ref 0.0–0.1)
Basophils Relative: 0 %
EOS PCT: 3 %
Eosinophils Absolute: 0.2 10*3/uL (ref 0.0–0.7)
HEMATOCRIT: 36.5 % — AB (ref 39.0–52.0)
HEMOGLOBIN: 12.1 g/dL — AB (ref 13.0–17.0)
LYMPHS PCT: 28 %
Lymphs Abs: 1.6 10*3/uL (ref 0.7–4.0)
MCH: 29.4 pg (ref 26.0–34.0)
MCHC: 33.2 g/dL (ref 30.0–36.0)
MCV: 88.6 fL (ref 78.0–100.0)
Monocytes Absolute: 0.7 10*3/uL (ref 0.1–1.0)
Monocytes Relative: 12 %
NEUTROS ABS: 3.1 10*3/uL (ref 1.7–7.7)
NEUTROS PCT: 57 %
Platelets: 153 10*3/uL (ref 150–400)
RBC: 4.12 MIL/uL — AB (ref 4.22–5.81)
RDW: 15.2 % (ref 11.5–15.5)
WBC: 5.6 10*3/uL (ref 4.0–10.5)

## 2017-11-02 LAB — COMPREHENSIVE METABOLIC PANEL
ALK PHOS: 69 U/L (ref 38–126)
ALT: 30 U/L (ref 17–63)
AST: 24 U/L (ref 15–41)
Albumin: 3.9 g/dL (ref 3.5–5.0)
Anion gap: 6 (ref 5–15)
BUN: 17 mg/dL (ref 6–20)
CALCIUM: 8.9 mg/dL (ref 8.9–10.3)
CO2: 25 mmol/L (ref 22–32)
CREATININE: 0.89 mg/dL (ref 0.61–1.24)
Chloride: 109 mmol/L (ref 101–111)
Glucose, Bld: 98 mg/dL (ref 65–99)
Potassium: 3.9 mmol/L (ref 3.5–5.1)
Sodium: 140 mmol/L (ref 135–145)
Total Bilirubin: 0.6 mg/dL (ref 0.3–1.2)
Total Protein: 6.9 g/dL (ref 6.5–8.1)

## 2017-11-07 ENCOUNTER — Ambulatory Visit (HOSPITAL_COMMUNITY)
Admission: RE | Admit: 2017-11-07 | Discharge: 2017-11-07 | Disposition: A | Discharge status: Home / Self Care | Payer: Medicaid Other | Source: Ambulatory Visit | Attending: Oncology | Admitting: Oncology

## 2017-11-07 DIAGNOSIS — C159 Malignant neoplasm of esophagus, unspecified: Secondary | ICD-10-CM | POA: Diagnosis not present

## 2017-11-07 MED ORDER — IOPAMIDOL (ISOVUE-300) INJECTION 61%
100.0000 mL | Freq: Once | INTRAVENOUS | Status: AC | PRN
  Administered 2017-11-07: 100 mL via INTRAVENOUS

## 2017-11-09 ENCOUNTER — Other Ambulatory Visit: Payer: Self-pay

## 2017-11-09 ENCOUNTER — Encounter (HOSPITAL_COMMUNITY): Payer: Self-pay | Admitting: Hematology

## 2017-11-09 ENCOUNTER — Inpatient Hospital Stay (HOSPITAL_BASED_OUTPATIENT_CLINIC_OR_DEPARTMENT_OTHER): Payer: Medicaid Other | Admitting: Hematology

## 2017-11-09 VITALS — BP 124/67 | HR 80 | Temp 98.3°F | Resp 16 | Wt 150.1 lb

## 2017-11-09 DIAGNOSIS — Z8546 Personal history of malignant neoplasm of prostate: Secondary | ICD-10-CM | POA: Diagnosis not present

## 2017-11-09 DIAGNOSIS — Z8501 Personal history of malignant neoplasm of esophagus: Secondary | ICD-10-CM | POA: Diagnosis not present

## 2017-11-09 DIAGNOSIS — Z923 Personal history of irradiation: Secondary | ICD-10-CM | POA: Diagnosis not present

## 2017-11-09 DIAGNOSIS — Z9221 Personal history of antineoplastic chemotherapy: Secondary | ICD-10-CM

## 2017-11-09 DIAGNOSIS — C159 Malignant neoplasm of esophagus, unspecified: Secondary | ICD-10-CM

## 2017-11-09 DIAGNOSIS — F17210 Nicotine dependence, cigarettes, uncomplicated: Secondary | ICD-10-CM

## 2017-11-09 NOTE — Assessment & Plan Note (Signed)
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 - He is smoking about 7 to 8 cigarettes/day.  He is trying to quit.  He quit drinking alcohol about 4 years ago.  I have told him to talk to his primary doctor about Chantix. - We will see him back in 6 months for follow-up with repeat blood work and CT scans.

## 2017-11-09 NOTE — Progress Notes (Signed)
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  CURRENT THERAPY: Surveillance  BRIEF ONCOLOGIC HISTORY:    Malignant neoplasm of prostate ([place])   04/23/2014 Procedure    [place] and prostate biopsy      04/23/2014 Pathology Results    2/12 cores positive for adenocarcinoma.  Gleason 3+3 = 6 pattern.      08/14/2014 Initial Diagnosis    Malignant neoplasm of prostate [facility])       - 09/20/2014 Radiation Therapy    Brachytherapy with I-125       Squamous cell carcinoma of esophagus ([place])   04/14/2016 Procedure    EGD by Dr. Laural Golden.      04/16/2016 Pathology Results    Esophagus, biopsy - INVASIVE POORLY DIFFERENTIATED SQUAMOUS CELL CARCINOMA.      04/26/2016 Imaging    CT CAP- 1. Prominent gastroesophageal junction without discrete esophageal mass. Notably, there is also a borderline enlarged enhancing lymph node in the gastrohepatic ligament immediately adjacent to the gastroesophageal junction, which is nonspecific, but could indicate early nodal disease. 2. The small cluster of low-attenuation lesions in segment 7 of the liver are too small to characterize. Statistically, these are likely to represent cysts. If there is clinical concern for metastatic disease to the liver, these could be further characterized with MRI of the abdomen with and without IV gadolinium at this time. 3. No other potential sites of metastatic disease noted elsewhere in the chest, abdomen or pelvis.      05/10/2016 PET scan    Hypermetabolic bilateral lower thoracic esophageal mass, consistent with known primary esophageal carcinoma.  Small hypermetabolic lymph node in the upper gastrohepatic ligament adjacent to GE junction, consistent with lymph node  metastasis.  No other sites of metastatic disease identified.      05/31/2016 Procedure    Port placed by Dr. Rosana Hoes      06/02/2016 - 07/14/2016 Chemotherapy    Weekly Carboplatin/Paclitaxel with XRT.  Tolerated extremely well.       06/02/2016 - 07/14/2016 Radiation Therapy    [place], .  Tolerated well.      08/23/2016 PET scan    1. Complete metabolic response. No residual hypermetabolism in the thoracic esophagus or in the upper gastrohepatic ligament lymph node, which is decreased in size. 2. Aortic atherosclerosis. Three-vessel coronary atherosclerosis.      08/24/2016 Miscellaneous    CTS consult with Dr. Servando Snare- "I've recommended to him that surgical resection offers the best chance of cure, if he waits and there is recurrent disease remaining no longer be a surgical candidate. From his discussion today I think he is not inclined to agree but it has been offered."      03/09/2017 Imaging    CT CAP: 1. Mild distal esophageal and GE junction thickening, which appears slightly more prominent compared with PET-CT 08/20/2016. Correlation with PET-CT suggested. 2. Negative for pulmonary nodules or metastatic disease to the chest. 3. Stable subcentimeter posterior right hepatic lobe hypodensities. No definite evidence for metastatic disease to the abdomen or pelvis      04/28/2017 PET scan    No evidence of recurrent or metastatic carcinoma.        CANCER STAGING: Cancer Staging Squamous cell carcinoma of esophagus ([place]) Staging form: Esophagus -  Squamous Cell Carcinoma, AJCC 8th Edition - Clinical stage from 06/15/2016: Stage Unknown (cTX, cN1, cM0, L: Lower) - Signed by Baird Cancer, PA-C on 06/15/2016    INTERVAL HISTORY:  Mr. Recupero 62 y.o. male returns for follow-up of his distal esophageal squamous cell carcinoma.  He denies any problems swallowing.  He is able to eat all sorts of foods.  He is continuing to smoke 7-8 spirits per day.  He quit drinking about  4 years ago.  He has been smoking for the past 30 years.  Energy levels are described as 100%.  Denies any tingling or numbness in the extremities.   REVIEW OF SYSTEMS:  Review of Systems  All other systems reviewed and are negative.    PAST MEDICAL/SURGICAL HISTORY:  Past Medical History:  Diagnosis Date  . 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 Anxiety   . Arthritis   . Cirrhosis, alcoholic ([place])   . Clubbing of fingers    congenital  . Depression   . Full dentures   . History of PSVT (paroxysmal supraventricular tachycardia)    run of non-sustatined VT 03-10-2015 in setting of alcohol withdrawal in hospital  . History of seizure    03-08-2012  alcohol withdrawal  . History of thrombocytopenia    10/ 2013  in setting of alcohol withdrawal  . Hyperlipidemia   . Hypertension   . Prostate cancer [facility]) urologist-  dr dalhstedt/  oncologist- dr Tammi Klippel   T1c, Gleason 3+3,  PSA 8.89,  vol 27cc  . 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 Squamous cell carcinoma of esophagus ([place]) 04/21/2016   Past Surgical History:  Procedure Laterality Date  . BIOPSY  04/14/2016   Procedure: BIOPSY;  Surgeon: Rogene Houston, MD;  [facility];  Service: Endoscopy;;  esophagus  . COLONOSCOPY N/A 04/14/2016   Procedure: COLONOSCOPY;  Surgeon: Rogene Houston, MD;  [facility];  Service: Endoscopy;  Laterality: N/A;  . ESOPHAGOGASTRODUODENOSCOPY N/A 04/14/2016   Procedure: ESOPHAGOGASTRODUODENOSCOPY (EGD);  Surgeon: Rogene Houston, MD;  Location[facility];  Service: Endoscopy;  Laterality: N/A;  1:00  . NO PAST SURGERIES    . PORTACATH PLACEMENT Right 05/31/2016   Procedure: INSERTION OF TUNNELED RIGHT INTERNAL JUGULAR BARD POWERPORT CENTRAL VENOUS CATHETER WITH SUBCUTANEOUS PORT;  Surgeon: Vickie Epley, MD;  Location: [facility];  Service: Vascular;  Laterality: Right;  .  RADIOACTIVE SEED IMPLANT N/A 09/20/2014   Procedure: RADIOACTIVE SEED IMPLANT;  Surgeon: Franchot Gallo, MD;  Location[facility];  Service: Urology;  Laterality: N/A;    77   seeds implanted no seeds found in bladder  . TRANSURETHRAL RESECTION OF PROSTATE       SOCIAL HISTORY:  Social History   Socioeconomic History  . Marital status: Single    Spouse name: Not on file  . Number of children: Not on file  . Years of education: Not on file  . Highest education level: Not on file  Occupational History  . Not on file  Social Needs  . Financial resource strain: Not on file  . Food insecurity:    Worry: Not on file    Inability: Not on file  . Transportation needs:    Medical: Not on file    Non-medical: Not on file  Tobacco Use  . Smoking status: Current Every Day  Smoker    Packs/day: 0.50    Years: 20.00    Pack years: 10.00    Types: Cigarettes  . Smokeless tobacco: Former Systems developer    Types: Chew    Quit date: 05/27/1984  . Tobacco comment: smokes 7 cigarettes a day  Substance and Sexual Activity  . Alcohol use: Not on file    Comment: quit drinking in October 2015. He says before this he says he stayed drunk every day.   . Drug use: No    Comment: last use october 2015  . Sexual activity: Never    Birth control/protection: None  Lifestyle  . Physical activity:    Days per week: Not on file    Minutes per session: Not on file  . Stress: Not on file  Relationships  . Social connections:    Talks on phone: Not on file    Gets together: Not on file    Attends religious service: Not on file    Active member of club or organization: Not on file    Attends meetings of clubs or organizations: Not on file    Relationship status: Not on file  . Intimate partner violence:    Fear of current or ex partner: Not on file    Emotionally abused: Not on file    Physically abused: Not on file    Forced sexual activity: Not on file  Other Topics Concern  . Not on  file  Social History Narrative  . Not on file    FAMILY HISTORY:  Family History  Problem Relation Age of Onset  . Alcohol abuse Mother   . Diabetes Sister     CURRENT MEDICATIONS:  Outpatient Encounter Medications as of 11/09/2017  Medication Sig  . amLODipine (NORVASC) 10 MG tablet TAKE 1 TABLET BY MOUTH EVERY DAY IN THE MORNING FOR HIGH BLOOD PRESSURE  . tamsulosin (FLOMAX) 0.4 MG CAPS capsule Take 1 capsule (0.4 mg total) by mouth daily after supper.  . [DISCONTINUED] nicotine (NICOTROL) 10 MG inhaler Inhale 1 Cartridge (1 continuous puffing total) into the lungs as needed for smoking cessation.   No facility-administered encounter medications on file as of 11/09/2017.     ALLERGIES:  No Known Allergies   PHYSICAL EXAM:  ECOG Performance status: 1  Vitals:   11/09/17 1421  BP: 124/67  Pulse: 80  Resp: 16  Temp: 98.3 F (36.8 C)  SpO2: 100%   Filed Weights   11/09/17 1421  Weight: 150 lb 1.6 oz (68.1 kg)    Physical Exam   LABORATORY DATA:  I have reviewed the labs as listed.  CBC    Component Value Date/Time   WBC 5.6 11/02/2017 1304   RBC 4.12 (L) 11/02/2017 1304   HGB 12.1 (L) 11/02/2017 1304   HCT 36.5 (L) 11/02/2017 1304   PLT 153 11/02/2017 1304   MCV 88.6 11/02/2017 1304   MCH 29.4 11/02/2017 1304   MCHC 33.2 11/02/2017 1304   RDW 15.2 11/02/2017 1304   LYMPHSABS 1.6 11/02/2017 1304   MONOABS 0.7 11/02/2017 1304   EOSABS 0.2 11/02/2017 1304   BASOSABS 0.0 11/02/2017 1304   CMP Latest Ref Rng & Units 11/02/2017 03/09/2017 10/20/2016  Glucose 65 - 99 mg/dL 98 86 90  BUN 6 - 20 mg/dL 17 16 17   Creatinine 0.61 - 1.24 mg/dL 0.89 0.94 1.09  Sodium 135 - 145 mmol/L 140 138 142  Potassium 3.5 - 5.1 mmol/L 3.9 3.9 4.1  Chloride 101 - 111 mmol/L  109 104 108  CO2 22 - 32 mmol/L 25 26 28   Calcium 8.9 - 10.3 mg/dL 8.9 9.0 9.2  Total Protein 6.5 - 8.1 g/dL 6.9 7.7 -  Total Bilirubin 0.3 - 1.2 mg/dL 0.6 0.6 -  Alkaline Phos 38 - 126 U/L 69 76 -  AST  15 - 41 U/L 24 18 -  ALT 17 - 63 U/L 30 18 -       DIAGNOSTIC IMAGING:  I have reviewed the images of the CT scan dated 11/07/2017 which did not show any evidence of recurrence or metastatic disease.    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 - He is smoking about 7 to 8 cigarettes/day.  He is trying to quit.  He quit drinking alcohol about 4 years ago.  I have told him to talk to his primary doctor about Chantix. - We will see him back in 6 months for follow-up with repeat blood work and CT scans.          Orders placed this encounter:  Orders Placed This Encounter  Procedures  . CT ABDOMEN PELVIS W CONTRAST  . CT CHEST W CONTRAST  . CBC with Differential/Platelet  . Comprehensive metabolic panel      Derek Jack, MD [place] [phone]

## 2017-11-17 ENCOUNTER — Other Ambulatory Visit: Payer: Self-pay | Admitting: Family Medicine

## 2018-01-02 ENCOUNTER — Other Ambulatory Visit: Payer: Self-pay | Admitting: Family Medicine

## 2018-01-02 DIAGNOSIS — C61 Malignant neoplasm of prostate: Secondary | ICD-10-CM

## 2018-02-20 ENCOUNTER — Other Ambulatory Visit: Payer: Self-pay | Admitting: Family Medicine

## 2018-02-28 ENCOUNTER — Ambulatory Visit (INDEPENDENT_AMBULATORY_CARE_PROVIDER_SITE_OTHER): Payer: Medicaid Other | Admitting: Family Medicine

## 2018-02-28 ENCOUNTER — Encounter: Payer: Self-pay | Admitting: Family Medicine

## 2018-02-28 VITALS — BP 110/68 | HR 83 | Resp 15 | Ht 70.0 in | Wt 151.8 lb

## 2018-02-28 DIAGNOSIS — C61 Malignant neoplasm of prostate: Secondary | ICD-10-CM

## 2018-02-28 DIAGNOSIS — I10 Essential (primary) hypertension: Secondary | ICD-10-CM | POA: Diagnosis not present

## 2018-02-28 DIAGNOSIS — F17200 Nicotine dependence, unspecified, uncomplicated: Secondary | ICD-10-CM | POA: Diagnosis not present

## 2018-02-28 DIAGNOSIS — R9389 Abnormal findings on diagnostic imaging of other specified body structures: Secondary | ICD-10-CM

## 2018-02-28 DIAGNOSIS — Z23 Encounter for immunization: Secondary | ICD-10-CM | POA: Diagnosis not present

## 2018-02-28 DIAGNOSIS — F1021 Alcohol dependence, in remission: Secondary | ICD-10-CM

## 2018-02-28 DIAGNOSIS — E559 Vitamin D deficiency, unspecified: Secondary | ICD-10-CM

## 2018-02-28 DIAGNOSIS — L404 Guttate psoriasis: Secondary | ICD-10-CM

## 2018-02-28 MED ORDER — AMLODIPINE BESYLATE 10 MG PO TABS: ORAL_TABLET | ORAL | 1 refills | Status: DC

## 2018-02-28 MED ORDER — TAMSULOSIN HCL 0.4 MG PO CAPS: 0.4000 mg | ORAL_CAPSULE | Freq: Every day | ORAL | 1 refills | Status: DC

## 2018-02-28 MED ORDER — TAMSULOSIN HCL 0.4 MG PO CAPS: ORAL_CAPSULE | ORAL | 1 refills | Status: DC

## 2018-02-28 NOTE — Progress Notes (Signed)
   Stanley Jimenez     MRN: [id number]      DOB: [birth year]   HPI Stanley Jimenez is here for follow up and re-evaluation of chronic medical conditions, medication management and review of any available recent lab and radiology data.  Preventive health is updated, specifically  Cancer screening and Immunization.   Questions or concerns regarding consultations or procedures which the PT has had in the interim are  addressed. The PT denies any adverse reactions to current medications since the last visit.  There are no new concerns.  There are no specific complaints   ROS Denies recent fever or chills. Denies sinus pressure, nasal congestion, ear pain or sore throat. Denies chest congestion, productive cough or wheezing. Denies chest pains, palpitations and leg swelling Denies abdominal pain, nausea, vomiting,diarrhea or constipation.   Denies dysuria, frequency, hesitancy or incontinence. Denies joint pain, swelling and limitation in mobility. Denies headaches, seizures, numbness, or tingling. Denies depression, anxiety or insomnia. Denies skin break down or rash.   PE  BP 110/68   Pulse 83   Resp 15   Ht 5\' 10"  (1.778 m)   Wt 151 lb 12.8 oz (68.9 kg)   SpO2 99%   BMI 21.78 kg/m   Patient alert and oriented and in no cardiopulmonary distress.  HEENT: No facial asymmetry, EOMI,   oropharynx pink and moist.  Neck supple no JVD, no mass.  Chest: Clear to auscultation bilaterally.  CVS: S1, S2 no murmurs, no S3.Regular rate.  ABD: Soft non tender.   Ext: No edema  MS: Adequate ROM spine, shoulders, hips and knees.  Skin: Intact, no ulcerations or rash noted.  Psych: Good eye contact, normal affect. Memory intact not anxious or depressed appearing.  CNS: CN 2-12 intact, power,  normal throughout.no focal deficits noted.   Assessment & Plan  Essential hypertension Controlled, no change in medication DASH diet and commitment to daily physical activity for a minimum of  30 minutes discussed and encouraged, as a part of hypertension management. The importance of attaining a healthy weight is also discussed.  BP/Weight 02/28/2018 11/09/2017 10/26/2017 05/04/2017 04/27/2017 12/13/2016 1/54/0086  Systolic BP 761 950 932 671 245 809 96  Diastolic BP 68 67 78 76 68 68 68  Wt. (Lbs) 151.8 150.1 149 152 150 146.4 143  BMI 21.78 21.54 21.38 21.81 21.52 21.01 20.52       NICOTINE ADDICTION Asked: confirms currently smokes cigarettes Assess: unwilling to quit current Advise: needs to quit to reduce all cancer, heart disease and stroke Assist: counseled for 5 mins and literature provided including quit # Arrange : f/u in 6 months  H/O alcohol dependence ([place]) Remains in remission, applauded on this  Abnormal CT scan, chest Followed by oncology and stable  Psoriasis, guttate No current or recent flare  Malignant neoplasm of prostate In remission followed by ocnology

## 2018-02-28 NOTE — Patient Instructions (Signed)
Physical exam wirth MD in 6 months, call if you need before  Flu vaccine today.  Please get fasting lipid, chem 7, TSH, vit D and PSA tomorrow morning, only water after 8 tonight  Calll [id number] 24/7 to help to get from `5 /day, to stopping entirely, you can do this

## 2018-03-01 DIAGNOSIS — C61 Malignant neoplasm of prostate: Secondary | ICD-10-CM | POA: Diagnosis not present

## 2018-03-01 DIAGNOSIS — E559 Vitamin D deficiency, unspecified: Secondary | ICD-10-CM | POA: Diagnosis not present

## 2018-03-01 DIAGNOSIS — I10 Essential (primary) hypertension: Secondary | ICD-10-CM | POA: Diagnosis not present

## 2018-03-02 LAB — LIPID PANEL
CHOL/HDL RATIO: 2.8 (calc) (ref <5.0)
CHOLESTEROL: 157 mg/dL (ref <200)
HDL: 56 mg/dL (ref >40)
LDL CHOLESTEROL (CALC): 84 mg/dL
Non-HDL Cholesterol (Calc): 101 mg/dL (calc) (ref <130)
Triglycerides: 78 mg/dL (ref <150)

## 2018-03-02 LAB — BASIC METABOLIC PANEL WITH GFR
BUN: 15 mg/dL (ref 7–25)
CO2: 26 mmol/L (ref 20–32)
CREATININE: 1.02 mg/dL (ref 0.70–1.25)
Calcium: 9.5 mg/dL (ref 8.6–10.3)
Chloride: 104 mmol/L (ref 98–110)
GFR, EST AFRICAN AMERICAN: 92 mL/min/{1.73_m2} (ref >=60)
GFR, EST NON AFRICAN AMERICAN: 80 mL/min/{1.73_m2} (ref >=60)
Glucose, Bld: 93 mg/dL (ref 65–99)
POTASSIUM: 4.8 mmol/L (ref 3.5–5.3)
SODIUM: 139 mmol/L (ref 135–146)

## 2018-03-02 LAB — VITAMIN D 25 HYDROXY (VIT D DEFICIENCY, FRACTURES): Vit D, 25-Hydroxy: 26 ng/mL — ABNORMAL LOW (ref 30–100)

## 2018-03-02 LAB — PSA: PSA: 0.6 ng/mL (ref <=4.0)

## 2018-03-20 ENCOUNTER — Encounter: Payer: Self-pay | Admitting: Family Medicine

## 2018-03-20 NOTE — Assessment & Plan Note (Signed)
In remission followed by ocnology

## 2018-03-20 NOTE — Assessment & Plan Note (Signed)
Asked: confirms currently smokes cigarettes Assess: unwilling to quit current Advise: needs to quit to reduce all cancer, heart disease and stroke Assist: counseled for 5 mins and literature provided including quit # Arrange : f/u in 6 months

## 2018-03-20 NOTE — Assessment & Plan Note (Signed)
Followed by oncology and stable 

## 2018-03-20 NOTE — Assessment & Plan Note (Signed)
Remains in remission, applauded on this

## 2018-03-20 NOTE — Assessment & Plan Note (Signed)
No current or recent flare

## 2018-03-20 NOTE — Assessment & Plan Note (Signed)
Controlled, no change in medication DASH diet and commitment to daily physical activity for a minimum of 30 minutes discussed and encouraged, as a part of hypertension management. The importance of attaining a healthy weight is also discussed.  BP/Weight 02/28/2018 11/09/2017 10/26/2017 05/04/2017 04/27/2017 12/13/2016 7/00/5259  Systolic BP 102 890 228 406 986 148 96  Diastolic BP 68 67 78 76 68 68 68  Wt. (Lbs) 151.8 150.1 149 152 150 146.4 143  BMI 21.78 21.54 21.38 21.81 21.52 21.01 20.52

## 2018-05-04 ENCOUNTER — Other Ambulatory Visit (HOSPITAL_COMMUNITY): Payer: Medicaid Other

## 2018-05-04 ENCOUNTER — Ambulatory Visit (HOSPITAL_COMMUNITY): Payer: Medicaid Other

## 2018-05-05 ENCOUNTER — Other Ambulatory Visit (HOSPITAL_COMMUNITY): Payer: Self-pay | Admitting: Nurse Practitioner

## 2018-05-05 DIAGNOSIS — C61 Malignant neoplasm of prostate: Secondary | ICD-10-CM

## 2018-05-05 DIAGNOSIS — C159 Malignant neoplasm of esophagus, unspecified: Secondary | ICD-10-CM

## 2018-05-09 ENCOUNTER — Other Ambulatory Visit (HOSPITAL_COMMUNITY): Payer: Self-pay | Admitting: Nurse Practitioner

## 2018-05-09 DIAGNOSIS — C159 Malignant neoplasm of esophagus, unspecified: Secondary | ICD-10-CM

## 2018-05-11 ENCOUNTER — Ambulatory Visit (HOSPITAL_COMMUNITY): Payer: Medicaid Other | Admitting: Hematology

## 2018-05-25 ENCOUNTER — Ambulatory Visit (HOSPITAL_COMMUNITY)
Admission: RE | Admit: 2018-05-25 | Discharge: 2018-05-25 | Disposition: A | Discharge status: Home / Self Care | Payer: Medicaid Other | Source: Ambulatory Visit | Attending: Nurse Practitioner | Admitting: Nurse Practitioner

## 2018-05-25 ENCOUNTER — Other Ambulatory Visit (HOSPITAL_COMMUNITY): Payer: Medicaid Other

## 2018-05-25 DIAGNOSIS — C159 Malignant neoplasm of esophagus, unspecified: Secondary | ICD-10-CM | POA: Diagnosis not present

## 2018-05-25 DIAGNOSIS — C61 Malignant neoplasm of prostate: Secondary | ICD-10-CM | POA: Insufficient documentation

## 2018-05-25 LAB — POCT I-STAT CREATININE: Creatinine, Ser: 1 mg/dL (ref 0.61–1.24)

## 2018-05-25 MED ORDER — IOHEXOL 300 MG/ML  SOLN
75.0000 mL | Freq: Once | INTRAMUSCULAR | Status: AC | PRN
  Administered 2018-05-25: 75 mL via INTRAVENOUS

## 2018-06-01 ENCOUNTER — Inpatient Hospital Stay (HOSPITAL_COMMUNITY): Payer: Medicaid Other | Attending: Hematology | Admitting: Hematology

## 2018-06-01 ENCOUNTER — Other Ambulatory Visit: Payer: Self-pay

## 2018-06-01 ENCOUNTER — Encounter (HOSPITAL_COMMUNITY): Payer: Self-pay | Admitting: Hematology

## 2018-06-01 VITALS — BP 121/75 | HR 87 | Temp 98.2°F | Resp 16 | Wt 150.0 lb

## 2018-06-01 DIAGNOSIS — Z9221 Personal history of antineoplastic chemotherapy: Secondary | ICD-10-CM | POA: Insufficient documentation

## 2018-06-01 DIAGNOSIS — Z8546 Personal history of malignant neoplasm of prostate: Secondary | ICD-10-CM | POA: Diagnosis not present

## 2018-06-01 DIAGNOSIS — F17210 Nicotine dependence, cigarettes, uncomplicated: Secondary | ICD-10-CM

## 2018-06-01 DIAGNOSIS — Z716 Tobacco abuse counseling: Secondary | ICD-10-CM

## 2018-06-01 DIAGNOSIS — Z8501 Personal history of malignant neoplasm of esophagus: Secondary | ICD-10-CM | POA: Diagnosis present

## 2018-06-01 DIAGNOSIS — I10 Essential (primary) hypertension: Secondary | ICD-10-CM

## 2018-06-01 DIAGNOSIS — Z923 Personal history of irradiation: Secondary | ICD-10-CM | POA: Diagnosis not present

## 2018-06-01 DIAGNOSIS — C159 Malignant neoplasm of esophagus, unspecified: Secondary | ICD-10-CM

## 2018-06-01 MED ORDER — VARENICLINE TARTRATE 1 MG PO TABS: 1.0000 mg | ORAL_TABLET | Freq: Two times a day (BID) | ORAL | 0 refills | Status: DC

## 2018-06-01 MED ORDER — VARENICLINE TARTRATE 0.5 MG PO TABS: ORAL_TABLET | ORAL | 0 refills | Status: DC

## 2018-06-01 NOTE — Assessment & Plan Note (Signed)
1.  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 -he denies any dysphagia or odynophagia.  No regurgitation of food reported. -His weight has been stable. - We discussed the results of CT CAP dated 05/25/2018 which shows stable exam. - He is still continuing smoking 7 to 8 cigarettes/day.  He quit drinking alcohol about 4-1/2 years ago. -We will see him back in 6 months for follow-up with repeat CT scans.  2.  Smoking cessation: - He is continuing to smoke 7 to 8 cigarettes/day. -I have counseled him to quit smoking.  We talked about various options including Chantix.  We discussed the side effects including suicidal ideation. - We will send him a starter pack to his pharmacy.  He was told to stop smoking in the next few weeks after starting Chantix.

## 2018-06-01 NOTE — Patient Instructions (Signed)
[facility] Discharge Instructions     Thank you for choosing  Cancer Center at [facility] to provide your oncology and hematology care.  To afford each patient quality time with our provider, please arrive at least 15 minutes before your scheduled appointment time.   If you have a lab appointment with the Cancer Center please come in thru the  Main Entrance and check in at the main information desk  You need to re-schedule your appointment should you arrive 10 or more minutes late.  We strive to give you quality time with our providers, and arriving late affects you and other patients whose appointments are after yours.  Also, if you no show three or more times for appointments you may be dismissed from the clinic at the providers discretion.     Again, thank you for choosing [facility].  Our hope is that these requests will decrease the amount of time that you wait before being seen by our physicians.       _____________________________________________________________  Should you have questions after your visit to [facility], please contact our office at [phone] between the hours of 8:00 a.m. and 4:30 p.m.  Voicemails left after 4:00 p.m. will not be returned until the following business day.  For prescription refill requests, have your pharmacy contact our office and allow 72 hours.    Cancer Center Support Programs:   > Cancer Support Group  2nd Tuesday of the month 1pm-2pm, Journey Room    

## 2018-06-01 NOTE — Progress Notes (Signed)
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  CURRENT THERAPY: Surveillance  BRIEF ONCOLOGIC HISTORY:    Malignant neoplasm of prostate ([place])   04/23/2014 Procedure    [place] and prostate biopsy    04/23/2014 Pathology Results    2/12 cores positive for adenocarcinoma.  Gleason 3+3 = 6 pattern.    08/14/2014 Initial Diagnosis    Malignant neoplasm of prostate [facility])     - 09/20/2014 Radiation Therapy    Brachytherapy with I-125     Squamous cell carcinoma of esophagus ([place])   04/14/2016 Procedure    EGD by Dr. Laural Golden.    04/16/2016 Pathology Results    Esophagus, biopsy - INVASIVE POORLY DIFFERENTIATED SQUAMOUS CELL CARCINOMA.    04/26/2016 Imaging    CT CAP- 1. Prominent gastroesophageal junction without discrete esophageal mass. Notably, there is also a borderline enlarged enhancing lymph node in the gastrohepatic ligament immediately adjacent to the gastroesophageal junction, which is nonspecific, but could indicate early nodal disease. 2. The small cluster of low-attenuation lesions in segment 7 of the liver are too small to characterize. Statistically, these are likely to represent cysts. If there is clinical concern for metastatic disease to the liver, these could be further characterized with MRI of the abdomen with and without IV gadolinium at this time. 3. No other potential sites of metastatic disease noted elsewhere in the chest, abdomen or pelvis.    05/10/2016 PET scan    Hypermetabolic bilateral lower thoracic esophageal mass, consistent with known primary esophageal carcinoma.  Small hypermetabolic lymph node in the upper gastrohepatic ligament adjacent to GE junction, consistent with lymph node metastasis.  No other sites  of metastatic disease identified.    05/31/2016 Procedure    Port placed by Dr. Rosana Hoes    06/02/2016 - 07/14/2016 Chemotherapy    Weekly Carboplatin/Paclitaxel with XRT.  Tolerated extremely well.     06/02/2016 - 07/14/2016 Radiation Therapy    [place].  Tolerated well.    08/23/2016 PET scan    1. Complete metabolic response. No residual hypermetabolism in the thoracic esophagus or in the upper gastrohepatic ligament lymph node, which is decreased in size. 2. Aortic atherosclerosis. Three-vessel coronary atherosclerosis.    08/24/2016 Miscellaneous    CTS consult with Dr. Servando Snare- "I've recommended to him that surgical resection offers the best chance of cure, if he waits and there is recurrent disease remaining no longer be a surgical candidate. From his discussion today I think he is not inclined to agree but it has been offered."    03/09/2017 Imaging    CT CAP: 1. Mild distal esophageal and GE junction thickening, which appears slightly more prominent compared with PET-CT 08/20/2016. Correlation with PET-CT suggested. 2. Negative for pulmonary nodules or metastatic disease to the chest. 3. Stable subcentimeter posterior right hepatic lobe hypodensities. No definite evidence for metastatic disease to the abdomen or pelvis    04/28/2017 PET scan    No evidence of recurrent or metastatic carcinoma.      CANCER STAGING: Cancer Staging Squamous cell carcinoma of esophagus ([place]) Staging form: Esophagus - Squamous Cell Carcinoma, AJCC 8th Edition - Clinical stage from 06/15/2016: Stage Unknown (cTX, cN1, cM0, L: Lower) - Signed by Baird Cancer, PA-C on 06/15/2016    INTERVAL  HISTORY:  Mr. Erman 63 y.o. male returns for routine follow-up for squamous cell carcinoma of the esophagus. He is doing well with no complaints at this time. Denies any nausea, vomiting, or diarrhea. Denies any new pains. Had not noticed any recent bleeding such as epistaxis, hematuria or hematochezia.  Denies recent chest pain on exertion, shortness of breath on minimal exertion, pre-syncopal episodes, or palpitations. Denies any numbness or tingling in hands or feet. Denies any recent fevers, infections, or recent hospitalizations. Denies any problems with swallowing or eating. He is maintaining his weight as well. He reports his appetite and energy level at 100%. He is expressing interesting in smoking cessation and wishes for [place] to call in a prescription. He is currently smoking about 7 cigarettes daily.    REVIEW OF SYSTEMS:  Review of Systems  All other systems reviewed and are negative.    PAST MEDICAL/SURGICAL HISTORY:  Past Medical History:  Diagnosis Date  . 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 Anxiety   . Arthritis   . Cirrhosis, alcoholic ([place])   . Clubbing of fingers    congenital  . Depression   . Full dentures   . History of PSVT (paroxysmal supraventricular tachycardia)    run of non-sustatined VT 03-10-2015 in setting of alcohol withdrawal in hospital  . History of seizure    03-08-2012  alcohol withdrawal  . History of thrombocytopenia    10/ 2013  in setting of alcohol withdrawal  . Hyperlipidemia   . Hypertension   . Prostate cancer [facility]) urologist-  dr dalhstedt/  oncologist- dr Tammi Klippel   T1c, Gleason 3+3,  PSA 8.89,  vol 27cc  . 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 Squamous cell carcinoma of esophagus ([facility] [facility]) 04/21/2016   Past Surgical History:  Procedure Laterality Date  . BIOPSY  04/14/2016   Procedure: BIOPSY;  Surgeon: Rogene Houston, MD;  [facility];  Service: Endoscopy;;  esophagus  . COLONOSCOPY N/A 04/14/2016   Procedure: COLONOSCOPY;  Surgeon: Rogene Houston, MD;  [facility];  Service: Endoscopy;  Laterality: N/A;  . ESOPHAGOGASTRODUODENOSCOPY N/A 04/14/2016   Procedure: ESOPHAGOGASTRODUODENOSCOPY (EGD);  Surgeon: Rogene Houston, MD;  [facility];  Service: Endoscopy;  Laterality: N/A;  1:00  . NO PAST SURGERIES    . PORTACATH PLACEMENT Right 05/31/2016   Procedure: INSERTION OF TUNNELED RIGHT INTERNAL JUGULAR BARD POWERPORT CENTRAL VENOUS CATHETER WITH SUBCUTANEOUS PORT;  Surgeon: Vickie Epley, MD;  Location: [facility];  Service: Vascular;  Laterality: Right;  . RADIOACTIVE SEED IMPLANT N/A 09/20/2014   Procedure: RADIOACTIVE SEED IMPLANT;  Surgeon: Franchot Gallo, MD;  Location: [facility];  Service: Urology;  Laterality: N/A;    77   seeds implanted no seeds found in bladder  . TRANSURETHRAL RESECTION OF PROSTATE       SOCIAL HISTORY:  Social History   Socioeconomic History  . Marital status: Single    Spouse name: Not on file  . Number of children: Not on file  . Years of education: Not on file  . Highest education level: Not on file  Occupational History  . Not on file  Social Needs  . Financial resource strain: Not on file  . Food insecurity:    Worry: Not on file    Inability: Not on file  . Transportation needs:  Medical: Not on file    Non-medical: Not on file  Tobacco Use  . Smoking status: Current Every Day Smoker    Packs/day: 0.50    Years: 20.00    Pack years: 10.00    Types: Cigarettes  . Smokeless tobacco: Former Systems developer    Types: Chew    Quit date: 05/27/1984  . Tobacco comment: smokes 7 cigarettes a day  Substance and Sexual Activity  . Alcohol use: Not on file    Comment: quit drinking in October 2015. He says before this he says he stayed drunk every day.   . Drug use: No    Comment: last use october 2015  . Sexual activity: Never    Birth control/protection: None  Lifestyle  . Physical activity:    Days per week: Not on file    Minutes per session: Not on file  . Stress: Not on file  Relationships  . Social connections:    Talks on phone: Not on file    Gets together: Not on file    Attends religious service: Not on file    Active  member of club or organization: Not on file    Attends meetings of clubs or organizations: Not on file    Relationship status: Not on file  . Intimate partner violence:    Fear of current or ex partner: Not on file    Emotionally abused: Not on file    Physically abused: Not on file    Forced sexual activity: Not on file  Other Topics Concern  . Not on file  Social History Narrative  . Not on file    FAMILY HISTORY:  Family History  Problem Relation Age of Onset  . Alcohol abuse Mother   . Diabetes Sister     CURRENT MEDICATIONS:  Outpatient Encounter Medications as of 06/01/2018  Medication Sig  . amLODipine (NORVASC) 10 MG tablet One tab daily  . tamsulosin (FLOMAX) 0.4 MG CAPS capsule Take 1 capsule (0.4 mg total) by mouth daily.  . varenicline (CHANTIX CONTINUING MONTH PAK) 1 MG tablet Take 1 tablet (1 mg total) by mouth 2 (two) times daily.  . varenicline (CHANTIX) 0.5 MG tablet Take one tablet daily for days 1-3. Take one tablet twice a day for days 4-7  . [DISCONTINUED] varenicline (CHANTIX CONTINUING MONTH PAK) 1 MG tablet Take 1 tablet (1 mg total) by mouth 2 (two) times daily.  . [DISCONTINUED] varenicline (CHANTIX) 0.5 MG tablet Take one tablet daily for days 1-3 Take one tablet twice a day for days 4-7   No facility-administered encounter medications on file as of 06/01/2018.     ALLERGIES:  No Known Allergies   PHYSICAL EXAM:  ECOG Performance status: 1  Vitals:   06/01/18 1000  BP: 121/75  Pulse: 87  Resp: 16  Temp: 98.2 F (36.8 C)  SpO2: 100%   Filed Weights   06/01/18 1000  Weight: 150 lb (68 kg)    Physical Exam Constitutional:      Appearance: Normal appearance. He is normal weight.  Musculoskeletal: Normal range of motion.  Skin:    General: Skin is warm and dry.  Neurological:     Mental Status: He is alert and oriented to person, place, and time. Mental status is at baseline.  Psychiatric:        Mood and Affect: Mood normal.         Behavior: Behavior normal.        Thought Content: Thought  content normal.        Judgment: Judgment normal.      LABORATORY DATA:  I have reviewed the labs as listed.  CBC    Component Value Date/Time   WBC 5.6 11/02/2017 1304   RBC 4.12 (L) 11/02/2017 1304   HGB 12.1 (L) 11/02/2017 1304   HCT 36.5 (L) 11/02/2017 1304   PLT 153 11/02/2017 1304   MCV 88.6 11/02/2017 1304   MCH 29.4 11/02/2017 1304   MCHC 33.2 11/02/2017 1304   RDW 15.2 11/02/2017 1304   LYMPHSABS 1.6 11/02/2017 1304   MONOABS 0.7 11/02/2017 1304   EOSABS 0.2 11/02/2017 1304   BASOSABS 0.0 11/02/2017 1304   CMP Latest Ref Rng & Units 05/25/2018 03/01/2018 11/02/2017  Glucose 65 - 99 mg/dL - 93 98  BUN 7 - 25 mg/dL - 15 17  Creatinine 0.61 - 1.24 mg/dL 1.00 1.02 0.89  Sodium 135 - 146 mmol/L - 139 140  Potassium 3.5 - 5.3 mmol/L - 4.8 3.9  Chloride 98 - 110 mmol/L - 104 109  CO2 20 - 32 mmol/L - 26 25  Calcium 8.6 - 10.3 mg/dL - 9.5 8.9  Total Protein 6.5 - 8.1 g/dL - - 6.9  Total Bilirubin 0.3 - 1.2 mg/dL - - 0.6  Alkaline Phos 38 - 126 U/L - - 69  AST 15 - 41 U/L - - 24  ALT 17 - 63 U/L - - 30       DIAGNOSTIC IMAGING:  I have independently reviewed the scans and discussed with the patient.   I have reviewed Francene Finders, NP's note and agree with the documentation.  I personally performed a face-to-face visit, made revisions and my assessment and plan is as follows.    ASSESSMENT & PLAN:   Squamous cell carcinoma of esophagus ([facility]) 1.  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 -he denies any dysphagia or odynophagia.  No regurgitation of food reported. -His weight has been stable. - We discussed the results of CT CAP dated 05/25/2018 which shows stable exam. - He is still continuing smoking 7 to 8 cigarettes/day.  He  quit drinking alcohol about 4-1/2 years ago. -We will see him back in 6 months for follow-up with repeat CT scans.  2.  Smoking cessation: - He is continuing to smoke 7 to 8 cigarettes/day. -I have counseled him to quit smoking.  We talked about various options including Chantix.  We discussed the side effects including suicidal ideation. - We will send him a starter pack to his pharmacy.  He was told to stop smoking in the next few weeks after starting Chantix.         Orders placed this encounter:  Orders Placed This Encounter  Procedures  . CT CHEST W CONTRAST  . CT Abdomen Pelvis W Contrast  . CBC with Differential/Platelet  . Comprehensive metabolic panel  . Lactate dehydrogenase  . TSH      Derek Jack, [place] [phone]

## 2018-08-31 ENCOUNTER — Encounter: Payer: Medicaid Other | Admitting: Family Medicine

## 2018-11-14 ENCOUNTER — Encounter: Payer: Medicaid Other | Admitting: Family Medicine

## 2018-11-20 ENCOUNTER — Other Ambulatory Visit (HOSPITAL_COMMUNITY): Payer: Self-pay | Admitting: Nurse Practitioner

## 2018-11-28 ENCOUNTER — Other Ambulatory Visit (HOSPITAL_COMMUNITY): Payer: Self-pay | Admitting: Nurse Practitioner

## 2018-11-30 ENCOUNTER — Other Ambulatory Visit (HOSPITAL_COMMUNITY): Payer: Medicaid Other

## 2018-11-30 ENCOUNTER — Ambulatory Visit (HOSPITAL_COMMUNITY): Payer: Medicaid Other

## 2018-12-04 ENCOUNTER — Other Ambulatory Visit: Payer: Self-pay

## 2018-12-04 ENCOUNTER — Encounter: Payer: Self-pay | Admitting: Family Medicine

## 2018-12-04 ENCOUNTER — Encounter (INDEPENDENT_AMBULATORY_CARE_PROVIDER_SITE_OTHER): Payer: Self-pay

## 2018-12-04 ENCOUNTER — Ambulatory Visit (INDEPENDENT_AMBULATORY_CARE_PROVIDER_SITE_OTHER): Payer: Medicaid Other | Admitting: Family Medicine

## 2018-12-04 VITALS — BP 110/74 | HR 80 | Temp 98.6°F | Resp 16 | Ht 70.0 in | Wt 163.0 lb

## 2018-12-04 DIAGNOSIS — F17200 Nicotine dependence, unspecified, uncomplicated: Secondary | ICD-10-CM

## 2018-12-04 DIAGNOSIS — Z0000 Encounter for general adult medical examination without abnormal findings: Secondary | ICD-10-CM

## 2018-12-04 DIAGNOSIS — L404 Guttate psoriasis: Secondary | ICD-10-CM | POA: Diagnosis not present

## 2018-12-04 DIAGNOSIS — I10 Essential (primary) hypertension: Secondary | ICD-10-CM | POA: Diagnosis not present

## 2018-12-04 MED ORDER — TRIAMCINOLONE ACETONIDE 0.1 % EX CREA: 1.0000 "application " | TOPICAL_CREAM | Freq: Two times a day (BID) | CUTANEOUS | 1 refills | Status: DC

## 2018-12-04 NOTE — Patient Instructions (Addendum)
F/U in 4 months with MD and for flu vaccine  Please work on cutting back on cigarettes now smoking 5 /day, need to quit  Please get cBC, fasting lipid, cmp and EGFR and TSH 1 week before next visit   Steps to Quit Smoking Smoking tobacco is the leading cause of preventable death. It can affect almost every organ in the body. Smoking puts you and people around you at risk for many serious, long-lasting (chronic) diseases. Quitting smoking can be hard, but it is one of the best things that you can do for your health. It is never too late to quit. How do I get ready to quit? When you decide to quit smoking, make a plan to help you succeed. Before you quit:  Pick a date to quit. Set a date within the next 2 weeks to give you time to prepare.  Write down the reasons why you are quitting. Keep this list in places where you will see it often.  Tell your family, friends, and co-workers that you are quitting. Their support is important.  Talk with your doctor about the choices that may help you quit.  Find out if your health insurance will pay for these treatments.  Know the people, places, things, and activities that make you want to smoke (triggers). Avoid them. What first steps can I take to quit smoking?  Throw away all cigarettes at home, at work, and in your car.  Throw away the things that you use when you smoke, such as ashtrays and lighters.  Clean your car. Make sure to empty the ashtray.  Clean your home, including curtains and carpets. What can I do to help me quit smoking? Talk with your doctor about taking medicines and seeing a counselor at the same time. You are more likely to succeed when you do both.  If you are pregnant or breastfeeding, talk with your doctor about counseling or other ways to quit smoking. Do not take medicine to help you quit smoking unless your doctor tells you to do so. To quit smoking: Quit right away  Quit smoking totally, instead of slowly  cutting back on how much you smoke over a period of time.  Go to counseling. You are more likely to quit if you go to counseling sessions regularly. Take medicine You may take medicines to help you quit. Some medicines need a prescription, and some you can buy over-the-counter. Some medicines may contain a drug called nicotine to replace the nicotine in cigarettes. Medicines may:  Help you to stop having the desire to smoke (cravings).  Help to stop the problems that come when you stop smoking (withdrawal symptoms). Your doctor may ask you to use:  Nicotine patches, gum, or lozenges.  Nicotine inhalers or sprays.  Non-nicotine medicine that is taken by mouth. Find resources Find resources and other ways to help you quit smoking and remain smoke-free after you quit. These resources are most helpful when you use them often. They include:  Online chats with a Social worker.  Phone quitlines.  Printed Furniture conservator/restorer.  Support groups or group counseling.  Text messaging programs.  Mobile phone apps. Use apps on your mobile phone or tablet that can help you stick to your quit plan. There are many free apps for mobile phones and tablets as well as websites. Examples include Quit Guide from the [employer] and smokefree.gov  What things can I do to make it easier to quit?   Talk to your family and  friends. Ask them to support and encourage you.  Call a phone quitline (1-800-QUIT-NOW), reach out to support groups, or work with a Social worker.  Ask people who smoke to not smoke around you.  Avoid places that make you want to smoke, such as: ? Bars. ? Parties. ? Smoke-break areas at work.  Spend time with people who do not smoke.  Lower the stress in your life. Stress can make you want to smoke. Try these things to help your stress: ? Getting regular exercise. ? Doing deep-breathing exercises. ? Doing yoga. ? Meditating. ? Doing a body scan. To do this, close your eyes, focus on one  area of your body at a time from head to toe. Notice which parts of your body are tense. Try to relax the muscles in those areas. How will I feel when I quit smoking? Day 1 to 3 weeks Within the first 24 hours, you may start to have some problems that come from quitting tobacco. These problems are very bad 2-3 days after you quit, but they do not often last for more than 2-3 weeks. You may get these symptoms:  Mood swings.  Feeling restless, nervous, angry, or annoyed.  Trouble concentrating.  Dizziness.  Strong desire for high-sugar foods and nicotine.  Weight gain.  Trouble pooping (constipation).  Feeling like you may vomit (nausea).  Coughing or a sore throat.  Changes in how the medicines that you take for other issues work in your body.  Depression.  Trouble sleeping (insomnia). Week 3 and afterward After the first 2-3 weeks of quitting, you may start to notice more positive results, such as:  Better sense of smell and taste.  Less coughing and sore throat.  Slower heart rate.  Lower blood pressure.  Clearer skin.  Better breathing.  Fewer sick days. Quitting smoking can be hard. Do not give up if you fail the first time. Some people need to try a few times before they succeed. Do your best to stick to your quit plan, and talk with your doctor if you have any questions or concerns. Summary  Smoking tobacco is the leading cause of preventable death. Quitting smoking can be hard, but it is one of the best things that you can do for your health.  When you decide to quit smoking, make a plan to help you succeed.  Quit smoking right away, not slowly over a period of time.  When you start quitting, seek help from your doctor, family, or friends. This information is not intended to replace advice given to you by your health care provider. Make sure you discuss any questions you have with your health care provider. Document Released: 03/06/2009 Document Revised:  07/28/2018 Document Reviewed: 07/29/2018 Elsevier Patient Education  2020 [employer].

## 2018-12-04 NOTE — Progress Notes (Signed)
   Stanley Jimenez     MRN: [id number]      DOB: [birth year]   HPI: Patient is in for annual physical exam. Medication is prescribed for his psoriasis States he is doing well and he has no concerns. No alcohol use, still smokes though trying to quit, none in the past 2 days.    PE; BP 110/74   Pulse 80   Temp 98.6 F (37 C) (Temporal)   Resp 16   Ht 5\' 10"  (1.778 m)   Wt 163 lb (73.9 kg)   SpO2 96%   BMI 23.39 kg/m   Pleasant male, alert and oriented x 3, in no cardio-pulmonary distress. Afebrile. HEENT No facial trauma or asymetry. Sinuses non tender. EOMI,  External ears normal, . Neck: supple, no adenopathy,JVD or thyromegaly.No bruits.  Chest: Clear to ascultation bilaterally.No crackles or wheezes. Non tender to palpation    Cardiovascular system; Heart sounds normal,  S1 and  S2 ,no S3.  No murmur, or thrill. Apical beat not displaced Peripheral pulses normal.  Abdomen: Soft, non tender, no organomegaly or masses. No bruits. Bowel sounds normal. No guarding, tenderness or rebound.  Rectal:  Normal sphincter tone. No hemorrhoids or  masses. guaiac negative stool. Prostate smooth and firm    Musculoskeletal exam: Full ROM of spine, hips , shoulders and knees. No deformity ,swelling or crepitus noted. No muscle wasting or atrophy.   Neurologic: Cranial nerves 2 to 12 intact. Power, tone ,sensation and reflexes normal throughout. No disturbance in gait. No tremor.  Skin: Intact,  scaling rash noted.on shins and elbows Pigmentation normal throughout  Psych; Normal mood and affect. Judgement and concentration normal   Assessment & Plan:  Annual physical exam Annual exam as documented. Counseling done  re healthy lifestyle involving commitment to 150 minutes exercise per week, heart healthy diet, and attaining healthy weight.The importance of adequate sleep also discussed. Changes in health habits are decided on by the patient with goals and  time frames  set for achieving them. Immunization and cancer screening needs are specifically addressed at this visit.   Psoriasis, guttate Mild  flare , triamcinolone cream prescribed  NICOTINE ADDICTION Asked:confirms currently smokes cigarettes on avg 2 to 5 / day Assess: willing to quit but cutting back Advise: needs to QUIT to reduce risk of cancer, cardio and cerebrovascular disease Assist: counseled for 5 minutes and literature provided Arrange: follow up in 3 months

## 2018-12-05 ENCOUNTER — Encounter: Payer: Medicaid Other | Admitting: Family Medicine

## 2018-12-06 ENCOUNTER — Encounter: Payer: Self-pay | Admitting: Family Medicine

## 2018-12-06 NOTE — Assessment & Plan Note (Signed)
Annual exam as documented. Counseling done  re healthy lifestyle involving commitment to 150 minutes exercise per week, heart healthy diet, and attaining healthy weight.The importance of adequate sleep also discussed. Changes in health habits are decided on by the patient with goals and time frames  set for achieving them. Immunization and cancer screening needs are specifically addressed at this visit. 

## 2018-12-06 NOTE — Assessment & Plan Note (Signed)
Asked:confirms currently smokes cigarettes on avg 2 to 5 / day Assess: willing to quit but cutting back Advise: needs to QUIT to reduce risk of cancer, cardio and cerebrovascular disease Assist: counseled for 5 minutes and literature provided Arrange: follow up in 3 months

## 2018-12-06 NOTE — Assessment & Plan Note (Signed)
Mild  flare , triamcinolone cream prescribed

## 2018-12-07 ENCOUNTER — Ambulatory Visit (HOSPITAL_COMMUNITY): Payer: Medicaid Other | Admitting: Hematology

## 2018-12-12 ENCOUNTER — Other Ambulatory Visit: Payer: Self-pay | Admitting: Family Medicine

## 2018-12-19 ENCOUNTER — Other Ambulatory Visit (HOSPITAL_COMMUNITY): Payer: Self-pay | Admitting: Hematology

## 2018-12-19 DIAGNOSIS — C159 Malignant neoplasm of esophagus, unspecified: Secondary | ICD-10-CM

## 2018-12-20 ENCOUNTER — Ambulatory Visit (HOSPITAL_COMMUNITY)
Admission: RE | Admit: 2018-12-20 | Discharge: 2018-12-20 | Disposition: A | Discharge status: Home / Self Care | Payer: Medicaid Other | Source: Ambulatory Visit | Attending: Nurse Practitioner | Admitting: Nurse Practitioner

## 2018-12-20 ENCOUNTER — Other Ambulatory Visit: Payer: Self-pay

## 2018-12-20 ENCOUNTER — Inpatient Hospital Stay (HOSPITAL_COMMUNITY): Payer: Medicaid Other | Attending: Nurse Practitioner

## 2018-12-20 DIAGNOSIS — K7689 Other specified diseases of liver: Secondary | ICD-10-CM | POA: Diagnosis not present

## 2018-12-20 DIAGNOSIS — F1011 Alcohol abuse, in remission: Secondary | ICD-10-CM | POA: Diagnosis not present

## 2018-12-20 DIAGNOSIS — Z923 Personal history of irradiation: Secondary | ICD-10-CM | POA: Insufficient documentation

## 2018-12-20 DIAGNOSIS — Z9221 Personal history of antineoplastic chemotherapy: Secondary | ICD-10-CM | POA: Insufficient documentation

## 2018-12-20 DIAGNOSIS — Z87891 Personal history of nicotine dependence: Secondary | ICD-10-CM | POA: Diagnosis not present

## 2018-12-20 DIAGNOSIS — I2510 Atherosclerotic heart disease of native coronary artery without angina pectoris: Secondary | ICD-10-CM | POA: Diagnosis not present

## 2018-12-20 DIAGNOSIS — Z8501 Personal history of malignant neoplasm of esophagus: Secondary | ICD-10-CM | POA: Insufficient documentation

## 2018-12-20 DIAGNOSIS — C159 Malignant neoplasm of esophagus, unspecified: Secondary | ICD-10-CM | POA: Diagnosis not present

## 2018-12-20 DIAGNOSIS — I700 Atherosclerosis of aorta: Secondary | ICD-10-CM | POA: Diagnosis not present

## 2018-12-20 DIAGNOSIS — Z79899 Other long term (current) drug therapy: Secondary | ICD-10-CM | POA: Insufficient documentation

## 2018-12-20 LAB — COMPREHENSIVE METABOLIC PANEL
ALT: 44 U/L (ref 0–44)
AST: 23 U/L (ref 15–41)
Albumin: 4.2 g/dL (ref 3.5–5.0)
Alkaline Phosphatase: 66 U/L (ref 38–126)
Anion gap: 7 (ref 5–15)
BUN: 19 mg/dL (ref 8–23)
CO2: 24 mmol/L (ref 22–32)
Calcium: 8.7 mg/dL — ABNORMAL LOW (ref 8.9–10.3)
Chloride: 105 mmol/L (ref 98–111)
Creatinine, Ser: 1.01 mg/dL (ref 0.61–1.24)
GFR calc Af Amer: >60 mL/min (ref >60)
GFR calc non Af Amer: >60 mL/min (ref >60)
Glucose, Bld: 89 mg/dL (ref 70–99)
Potassium: 3.9 mmol/L (ref 3.5–5.1)
Sodium: 136 mmol/L (ref 135–145)
Total Bilirubin: 0.6 mg/dL (ref 0.3–1.2)
Total Protein: 6.9 g/dL (ref 6.5–8.1)

## 2018-12-20 LAB — CBC WITH DIFFERENTIAL/PLATELET
Abs Immature Granulocytes: 0.03 10*3/uL (ref 0.00–0.07)
Basophils Absolute: 0 10*3/uL (ref 0.0–0.1)
Basophils Relative: 1 %
Eosinophils Absolute: 0.2 10*3/uL (ref 0.0–0.5)
Eosinophils Relative: 3 %
HCT: 36.4 % — ABNORMAL LOW (ref 39.0–52.0)
Hemoglobin: 12.2 g/dL — ABNORMAL LOW (ref 13.0–17.0)
Immature Granulocytes: 1 %
Lymphocytes Relative: 26 %
Lymphs Abs: 1.6 10*3/uL (ref 0.7–4.0)
MCH: 29.8 pg (ref 26.0–34.0)
MCHC: 33.5 g/dL (ref 30.0–36.0)
MCV: 88.8 fL (ref 80.0–100.0)
Monocytes Absolute: 0.9 10*3/uL (ref 0.1–1.0)
Monocytes Relative: 16 %
Neutro Abs: 3.3 10*3/uL (ref 1.7–7.7)
Neutrophils Relative %: 53 %
Platelets: 156 10*3/uL (ref 150–400)
RBC: 4.1 MIL/uL — ABNORMAL LOW (ref 4.22–5.81)
RDW: 14.8 % (ref 11.5–15.5)
WBC: 6.1 10*3/uL (ref 4.0–10.5)
nRBC: 0 % (ref 0.0–0.2)

## 2018-12-20 LAB — POCT I-STAT CREATININE: Creatinine, Ser: 1.1 mg/dL (ref 0.61–1.24)

## 2018-12-20 LAB — LACTATE DEHYDROGENASE: LDH: 158 U/L (ref 98–192)

## 2018-12-20 LAB — TSH: TSH: 1.08 u[IU]/mL (ref 0.350–4.500)

## 2018-12-20 MED ORDER — IOHEXOL 300 MG/ML  SOLN
100.0000 mL | Freq: Once | INTRAMUSCULAR | Status: AC | PRN
  Administered 2018-12-20: 11:00:00 100 mL via INTRAVENOUS

## 2018-12-22 ENCOUNTER — Other Ambulatory Visit: Payer: Self-pay

## 2018-12-22 ENCOUNTER — Inpatient Hospital Stay (HOSPITAL_BASED_OUTPATIENT_CLINIC_OR_DEPARTMENT_OTHER): Payer: Medicaid Other | Admitting: Hematology

## 2018-12-22 ENCOUNTER — Ambulatory Visit (HOSPITAL_COMMUNITY): Payer: Medicaid Other | Admitting: Hematology

## 2018-12-22 VITALS — BP 143/84 | HR 77 | Temp 97.8°F | Resp 16 | Wt 167.2 lb

## 2018-12-22 DIAGNOSIS — F1011 Alcohol abuse, in remission: Secondary | ICD-10-CM

## 2018-12-22 DIAGNOSIS — Z923 Personal history of irradiation: Secondary | ICD-10-CM

## 2018-12-22 DIAGNOSIS — Z8501 Personal history of malignant neoplasm of esophagus: Secondary | ICD-10-CM | POA: Diagnosis not present

## 2018-12-22 DIAGNOSIS — C159 Malignant neoplasm of esophagus, unspecified: Secondary | ICD-10-CM

## 2018-12-22 DIAGNOSIS — Z87891 Personal history of nicotine dependence: Secondary | ICD-10-CM

## 2018-12-22 DIAGNOSIS — Z8546 Personal history of malignant neoplasm of prostate: Secondary | ICD-10-CM

## 2018-12-22 DIAGNOSIS — Z9221 Personal history of antineoplastic chemotherapy: Secondary | ICD-10-CM

## 2018-12-22 NOTE — Assessment & Plan Note (Addendum)
1.  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 -he denies any dysphagia or odynophagia.  No regurgitation of food reported. -His weight has been stable. - CT CAP dated 05/25/2018 which shows stable exam. - CT of chest and abdomen from December 20, 2018 did not show any evidence of recurrent or metastatic disease.  Hypodensity liver lesions continue to be stable. - He denies any dysphasia or odynophagia.  No hemoptysis.  Weight is stable.   - Plan to repeat CT chest and abdomen in 6 months.  2.  Smoking cessation: - Patient has completed Chantix and no longer smokes cigarettes.  Stop smoking on December 06, 2018.

## 2018-12-22 NOTE — Progress Notes (Signed)
Stanley Jimenez, [place] [address]   CLINIC:  Medical Oncology/Hematology  PCP:  [name], MD [address] [place] [address] [phone]   REASON FOR VISIT:  Follow-up for Esophogeal Carcinoma   CURRENT THERAPY: Clinical surveillance  BRIEF ONCOLOGIC HISTORY:  Oncology History  Malignant neoplasm of prostate ([place])  04/23/2014 Procedure   [place] and prostate biopsy   04/23/2014 Pathology Results   2/12 cores positive for adenocarcinoma.  Gleason 3+3 = 6 pattern.   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 Laural Golden.   04/16/2016 Pathology Results   Esophagus, biopsy - INVASIVE POORLY DIFFERENTIATED SQUAMOUS CELL CARCINOMA.   04/26/2016 Imaging   CT CAP- 1. Prominent gastroesophageal junction without discrete esophageal mass. Notably, there is also a borderline enlarged enhancing lymph node in the gastrohepatic ligament immediately adjacent to the gastroesophageal junction, which is nonspecific, but could indicate early nodal disease. 2. The small cluster of low-attenuation lesions in segment 7 of the liver are too small to characterize. Statistically, these are likely to represent cysts. If there is clinical concern for metastatic disease to the liver, these could be further characterized with MRI of the abdomen with and without IV gadolinium at this time. 3. No other potential sites of metastatic disease noted elsewhere in the chest, abdomen or pelvis.   05/10/2016 PET scan   Hypermetabolic bilateral lower thoracic esophageal mass, consistent with known primary esophageal carcinoma.  Small hypermetabolic lymph node in the upper gastrohepatic ligament adjacent to GE junction, consistent with lymph node metastasis.  No other sites of metastatic  disease identified.   05/31/2016 Procedure   Port placed by Dr. Rosana Hoes   06/02/2016 - 07/14/2016 Chemotherapy   Weekly Carboplatin/Paclitaxel with XRT.  Tolerated extremely well.    06/02/2016 - 07/14/2016 Radiation Therapy   [place], .  Tolerated well.   08/23/2016 PET scan   1. Complete metabolic response. No residual hypermetabolism in the thoracic esophagus or in the upper gastrohepatic ligament lymph node, which is decreased in size. 2. Aortic atherosclerosis. Three-vessel coronary atherosclerosis.   08/24/2016 Miscellaneous   CTS consult with Dr. Servando Snare- "I've recommended to him that surgical resection offers the best chance of cure, if he waits and there is recurrent disease remaining no longer be a surgical candidate. From his discussion today I think he is not inclined to agree but it has been offered."   03/09/2017 Imaging   CT CAP: 1. Mild distal esophageal and GE junction thickening, which appears slightly more prominent compared with PET-CT 08/20/2016. Correlation with PET-CT suggested. 2. Negative for pulmonary nodules or metastatic disease to the chest. 3. Stable subcentimeter posterior right hepatic lobe hypodensities. No definite evidence for metastatic disease to the abdomen or pelvis   04/28/2017 PET scan   No evidence of recurrent or metastatic carcinoma.      CANCER STAGING: Cancer Staging Squamous cell carcinoma of esophagus (HCC) Staging form: Esophagus - Squamous Cell Carcinoma, AJCC 8th Edition - Clinical stage from 06/15/2016: Stage Unknown (cTX, cN1, cM0, L: Lower) - Signed by Baird Cancer, PA-C on 06/15/2016    INTERVAL HISTORY:  Stanley Jimenez 63 y.o. male presents today for follow-up.  He reports overall doing well.  Denies any significant fatigue.  He denies any difficulty swallowing.  He denies any  lymphadenopathy.  He reports he quit smoking 16 days ago after taking Chantix.  He denies any new cough or hemoptysis.  No change in bowel habits.  No  abdominal pain.  No fevers or recent infections.  No hospitalizations.  REVIEW OF SYSTEMS:  Review of Systems  All other systems reviewed and are negative.    PAST MEDICAL/SURGICAL HISTORY:  Past Medical History:  Diagnosis Date  . 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 Anxiety   . Arthritis   . Cirrhosis, alcoholic ([place])   . Clubbing of fingers    congenital  . Depression   . Full dentures   . History of PSVT (paroxysmal supraventricular tachycardia)    run of non-sustatined VT 03-10-2015 in setting of alcohol withdrawal in hospital  . History of seizure    03-08-2012  alcohol withdrawal  . History of thrombocytopenia    10/ 2013  in setting of alcohol withdrawal  . Hyperlipidemia   . Hypertension   . Prostate cancer [facility]) urologist-  dr dalhstedt/  oncologist- dr Tammi Klippel   T1c, Gleason 3+3,  PSA 8.89,  vol 27cc  . 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 BIOPSY  04/14/2016   Procedure: BIOPSY;  Surgeon: Rogene Houston, MD;  [facility];  Service: Endoscopy;;  esophagus  . COLONOSCOPY N/A 04/14/2016   Procedure: COLONOSCOPY;  Surgeon: Rogene Houston, MD;  [facility];  Service: Endoscopy;  Laterality: N/A;  . ESOPHAGOGASTRODUODENOSCOPY N/A 04/14/2016   Procedure: ESOPHAGOGASTRODUODENOSCOPY (EGD);  Surgeon: Rogene Houston, MD;  [facility];  Service: Endoscopy;  Laterality: N/A;  1:00  . NO PAST SURGERIES    . PORTACATH PLACEMENT Right 05/31/2016   Procedure: INSERTION OF TUNNELED RIGHT INTERNAL JUGULAR BARD POWERPORT CENTRAL VENOUS CATHETER WITH SUBCUTANEOUS PORT;  Surgeon: Vickie Epley, MD;  Location: [facility];  Service: Vascular;  Laterality: Right;  . RADIOACTIVE SEED IMPLANT N/A 09/20/2014   Procedure: RADIOACTIVE SEED IMPLANT;   Surgeon: Franchot Gallo, MD;  Location[facility];  Service: Urology;  Laterality: N/A;    77   seeds implanted no seeds found in bladder  . TRANSURETHRAL RESECTION OF PROSTATE       SOCIAL HISTORY:  Social History   Socioeconomic History  . Marital status: Single    Spouse name: Not on file  . Number of children: Not on file  . Years of education: Not on file  . Highest education level: Not on file  Occupational History  . Not on file  Social Needs  . Financial resource strain: Not on file  . Food insecurity    Worry: Not on file    Inability: Not on file  . Transportation needs    Medical: Not on file    Non-medical: Not on file  Tobacco Use  . Smoking status: Current Every Day Smoker    Packs/day: 0.50    Years: 20.00    Pack years: 10.00    Types: Cigarettes  . Smokeless tobacco: Former Systems developer    Types: Chew    Quit date: 05/27/1984  . Tobacco comment: hasn't smoked in 3 days  Substance and Sexual Activity  . Alcohol use: Not on file    Comment: quit drinking in October 2015.  He says before this he says he stayed drunk every day.   . Drug use: No    Comment: last use october 2015  . Sexual activity: Never    Birth control/protection: None  Lifestyle  . Physical activity    Days per week: Not on file    Minutes per session: Not on file  . Stress: Not on file  Relationships  . Social Herbalist on phone: Not on file    Gets together: Not on file    Attends religious service: Not on file    Active member of club or organization: Not on file    Attends meetings of clubs or organizations: Not on file    Relationship status: Not on file  . Intimate partner violence    Fear of current or ex partner: Not on file    Emotionally abused: Not on file    Physically abused: Not on file    Forced sexual activity: Not on file  Other Topics Concern  . Not on file  Social History Narrative  . Not on file    FAMILY HISTORY:  Family History   Problem Relation Age of Onset  . Alcohol abuse Mother   . Diabetes Sister     CURRENT MEDICATIONS:  Outpatient Encounter Medications as of 12/22/2018  Medication Sig  . amLODipine (NORVASC) 10 MG tablet TAKE 1 TABLET BY MOUTH DAILY  . tamsulosin (FLOMAX) 0.4 MG CAPS capsule Take 1 capsule (0.4 mg total) by mouth daily.  Marland Kitchen triamcinolone cream (KENALOG) 0.1 % Apply 1 application topically 2 (two) times daily.  . varenicline (CHANTIX) 1 MG tablet Take 1 mg by mouth 2 (two) times daily.   No facility-administered encounter medications on file as of 12/22/2018.     ALLERGIES:  No Known Allergies   PHYSICAL EXAM:  ECOG Performance status: 1  Vitals:   12/22/18 1136  BP: (!) 143/84  Pulse: 77  Resp: 16  Temp: 97.8 F (36.6 C)  SpO2: 100%   Filed Weights   12/22/18 1136  Weight: 167 lb 3.2 oz (75.8 kg)    Physical Exam Constitutional:      Appearance: Normal appearance. He is obese.  HENT:     Head: Normocephalic.     Right Ear: External ear normal.     Left Ear: External ear normal.     Nose: Nose normal.     Mouth/Throat:     Mouth: Mucous membranes are moist.     Pharynx: Oropharynx is clear.  Eyes:     Extraocular Movements: Extraocular movements intact.     Conjunctiva/sclera: Conjunctivae normal.  Neck:     Musculoskeletal: Normal range of motion.  Cardiovascular:     Rate and Rhythm: Normal rate and regular rhythm.     Pulses: Normal pulses.     Heart sounds: Normal heart sounds.  Pulmonary:     Effort: Pulmonary effort is normal.     Breath sounds: Normal breath sounds.  Abdominal:     General: Bowel sounds are normal.     Palpations: Abdomen is soft.  Musculoskeletal: Normal range of motion.  Skin:    General: Skin is warm and dry.  Neurological:     General: No focal deficit present.     Mental Status: He is alert and oriented to person, place, and time.  Psychiatric:        Mood and Affect: Mood normal.        Behavior: Behavior normal.  Thought Content: Thought content normal.        Judgment: Judgment normal.      LABORATORY DATA:  I have reviewed the labs as listed.  CBC    Component Value Date/Time   WBC 6.1 12/20/2018 1057   RBC 4.10 (L) 12/20/2018 1057   HGB 12.2 (L) 12/20/2018 1057   HCT 36.4 (L) 12/20/2018 1057   PLT 156 12/20/2018 1057   MCV 88.8 12/20/2018 1057   MCH 29.8 12/20/2018 1057   MCHC 33.5 12/20/2018 1057   RDW 14.8 12/20/2018 1057   LYMPHSABS 1.6 12/20/2018 1057   MONOABS 0.9 12/20/2018 1057   EOSABS 0.2 12/20/2018 1057   BASOSABS 0.0 12/20/2018 1057   CMP Latest Ref Rng & Units 12/20/2018 12/20/2018 05/25/2018  Glucose 70 - 99 mg/dL 89 - -  BUN 8 - 23 mg/dL 19 - -  Creatinine 0.61 - 1.24 mg/dL 1.01 1.10 1.00  Sodium 135 - 145 mmol/L 136 - -  Potassium 3.5 - 5.1 mmol/L 3.9 - -  Chloride 98 - 111 mmol/L 105 - -  CO2 22 - 32 mmol/L 24 - -  Calcium 8.9 - 10.3 mg/dL 8.7(L) - -  Total Protein 6.5 - 8.1 g/dL 6.9 - -  Total Bilirubin 0.3 - 1.2 mg/dL 0.6 - -  Alkaline Phos 38 - 126 U/L 66 - -  AST 15 - 41 U/L 23 - -  ALT 0 - 44 U/L 44 - -        ASSESSMENT & PLAN:   Squamous cell carcinoma of esophagus (HCC) 1.  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 -he denies any dysphagia or odynophagia.  No regurgitation of food reported. -His weight has been stable. - CT CAP dated 05/25/2018 which shows stable exam. - CT of chest and abdomen from December 20, 2018 did not show any evidence of recurrent or metastatic disease.  Hypodensity liver lesions continue to be stable. - He denies any dysphasia or odynophagia.  No hemoptysis.  Weight is stable.   - Plan to repeat CT chest and abdomen in 6 months.  2.  Smoking cessation: - Patient has completed Chantix and no longer smokes cigarettes.  Stop smoking on December 06, 2018.         Orders placed this encounter:  Orders Placed This Encounter  Procedures  . CT Chest W Contrast  . CT Abdomen W Contrast  . CBC with Differential  . Comprehensive metabolic panel  . Lactate dehydrogenase  . TSH      Roger Shelter, [place] [phone]

## 2019-02-05 ENCOUNTER — Other Ambulatory Visit: Payer: Self-pay | Admitting: Family Medicine

## 2019-04-09 ENCOUNTER — Encounter: Payer: Self-pay | Admitting: Family Medicine

## 2019-04-09 ENCOUNTER — Ambulatory Visit (INDEPENDENT_AMBULATORY_CARE_PROVIDER_SITE_OTHER): Payer: Medicaid Other | Admitting: Family Medicine

## 2019-04-09 ENCOUNTER — Other Ambulatory Visit: Payer: Self-pay

## 2019-04-09 VITALS — BP 128/78 | HR 83 | Temp 97.8°F | Resp 15 | Ht 70.0 in | Wt 175.0 lb

## 2019-04-09 DIAGNOSIS — I10 Essential (primary) hypertension: Secondary | ICD-10-CM

## 2019-04-09 DIAGNOSIS — E559 Vitamin D deficiency, unspecified: Secondary | ICD-10-CM | POA: Diagnosis not present

## 2019-04-09 DIAGNOSIS — L404 Guttate psoriasis: Secondary | ICD-10-CM | POA: Diagnosis not present

## 2019-04-09 DIAGNOSIS — Z13220 Encounter for screening for lipoid disorders: Secondary | ICD-10-CM

## 2019-04-09 DIAGNOSIS — Z23 Encounter for immunization: Secondary | ICD-10-CM | POA: Diagnosis not present

## 2019-04-09 LAB — CBC
HCT: 39.4 % (ref 38.5–50.0)
Hemoglobin: 13 g/dL — ABNORMAL LOW (ref 13.2–17.1)
MCH: 28.7 pg (ref 27.0–33.0)
MCHC: 33 g/dL (ref 32.0–36.0)
MCV: 87 fL (ref 80.0–100.0)
MPV: 10.1 fL (ref 7.5–12.5)
Platelets: 215 10*3/uL (ref 140–400)
RBC: 4.53 10*6/uL (ref 4.20–5.80)
RDW: 13.8 % (ref 11.0–15.0)
WBC: 6.9 10*3/uL (ref 3.8–10.8)

## 2019-04-09 LAB — COMPLETE METABOLIC PANEL WITH GFR
AG Ratio: 1.5 (calc) (ref 1.0–2.5)
ALT: 34 U/L (ref 9–46)
AST: 21 U/L (ref 10–35)
Albumin: 4.3 g/dL (ref 3.6–5.1)
Alkaline phosphatase (APISO): 81 U/L (ref 35–144)
BUN: 16 mg/dL (ref 7–25)
CO2: 29 mmol/L (ref 20–32)
Calcium: 9.4 mg/dL (ref 8.6–10.3)
Chloride: 104 mmol/L (ref 98–110)
Creat: 1.22 mg/dL (ref 0.70–1.25)
GFR, Est African American: 74 mL/min/{1.73_m2} (ref >=60)
GFR, Est Non African American: 64 mL/min/{1.73_m2} (ref >=60)
Globulin: 2.9 g/dL (calc) (ref 1.9–3.7)
Glucose, Bld: 98 mg/dL (ref 65–99)
Potassium: 4.2 mmol/L (ref 3.5–5.3)
Sodium: 139 mmol/L (ref 135–146)
Total Bilirubin: 0.7 mg/dL (ref 0.2–1.2)
Total Protein: 7.2 g/dL (ref 6.1–8.1)

## 2019-04-09 LAB — LIPID PANEL
Cholesterol: 179 mg/dL (ref <200)
HDL: 52 mg/dL (ref >=40)
LDL Cholesterol (Calc): 108 mg/dL (calc) — ABNORMAL HIGH
Non-HDL Cholesterol (Calc): 127 mg/dL (calc) (ref <130)
Total CHOL/HDL Ratio: 3.4 (calc) (ref <5.0)
Triglycerides: 93 mg/dL (ref <150)

## 2019-04-09 LAB — TSH: TSH: 1.04 mIU/L (ref 0.40–4.50)

## 2019-04-09 MED ORDER — TAMSULOSIN HCL 0.4 MG PO CAPS: 0.4000 mg | ORAL_CAPSULE | Freq: Every day | ORAL | 1 refills | Status: DC

## 2019-04-09 MED ORDER — TRIAMCINOLONE ACETONIDE 0.1 % EX CREA: TOPICAL_CREAM | CUTANEOUS | 3 refills | Status: DC

## 2019-04-09 MED ORDER — AMLODIPINE BESYLATE 10 MG PO TABS: ORAL_TABLET | ORAL | 1 refills | Status: DC

## 2019-04-09 NOTE — Patient Instructions (Addendum)
F/U in office with MD , re eval weight  And chronic problems in 5 monhts, call if you need me sooner  CONGRATS on stopping smoking  Flu vaccine today  Cream sent in for psoriasis   Labs today , only lipid, cmp and EGFr, Vit D please cancel any other  Please cut back on sweet  , cookies and cakes, and sweet drinkks

## 2019-04-09 NOTE — Progress Notes (Signed)
   Stanley Jimenez     MRN: [id number]      DOB: Sep 17, 1955   HPI [name] is here for follow up and re-evaluation of chronic medical conditions, medication management and review of any available recent lab and radiology data.  Preventive health is updated, specifically  Cancer screening and Immunization.   Questions or concerns regarding consultations or procedures which the PT has had in the interim are  addressed. The PT denies any adverse reactions to current medications since the last visit.  C/o flare of skin condition and needs medication prescribed   ROS Denies recent fever or chills. Denies sinus pressure, nasal congestion, ear pain or sore throat. Denies chest congestion, productive cough or wheezing. Denies chest pains, palpitations and leg swelling Denies abdominal pain, nausea, vomiting,diarrhea or constipation.   Denies dysuria, frequency, hesitancy or incontinence. Denies joint pain, swelling and limitation in mobility. Denies headaches, seizures, numbness, or tingling. Denies depression, anxiety or insomnia.  PE  BP 128/78   Pulse 83   Temp 97.8 F (36.6 C) (Temporal)   Resp 15   Ht 5\' 10"  (1.778 m)   Wt 175 lb (79.4 kg)   SpO2 97%   BMI 25.11 kg/m    Patient alert and oriented and in no cardiopulmonary distress.  HEENT: No facial asymmetry, EOMI,     Neck supple .  Chest: Clear to auscultation bilaterally.  CVS: S1, S2 no murmurs, no S3.Regular rate.  ABD: Soft non tender.   Ext: No edema  MS: Adequate ROM spine, shoulders, hips and knees.  Skin: Intact, hyperpigmented , scaling ,papular rash on lower extremities  Psych: Good eye contact, normal affect. Memory intact not anxious or depressed appearing.  CNS: CN 2-12 intact, power,  normal throughout.no focal deficits noted.   Assessment & Plan Essential hypertension Controlled, no change in medication DASH diet and commitment to daily physical activity for a minimum of 30 minutes  discussed and encouraged, as a part of hypertension management. The importance of attaining a healthy weight is also discussed.  BP/Weight 04/09/2019 12/22/2018 12/04/2018 06/01/2018 02/28/2018 [id number]  Systolic BP 0000000 [id number]  Diastolic BP 78 84 74 75 68 67 78  Wt. (Lbs) 175 167.2 163 150 151.8 150.1 149  BMI 25.11 23.99 23.39 21.52 21.78 21.54 21.38       Psoriasis, guttate Current mild/moderate flare topical medicaion prescribed

## 2019-04-14 ENCOUNTER — Encounter: Payer: Self-pay | Admitting: Family Medicine

## 2019-04-14 NOTE — Assessment & Plan Note (Signed)
Controlled, no change in medication DASH diet and commitment to daily physical activity for a minimum of 30 minutes discussed and encouraged, as a part of hypertension management. The importance of attaining a healthy weight is also discussed.  BP/Weight 04/09/2019 12/22/2018 12/04/2018 06/01/2018 02/28/2018 [id number]  Systolic BP 0000000 [id number]  Diastolic BP 78 84 74 75 68 67 78  Wt. (Lbs) 175 167.2 163 150 151.8 150.1 149  BMI 25.11 23.99 23.39 21.52 21.78 21.54 21.38

## 2019-04-14 NOTE — Assessment & Plan Note (Signed)
Current mild/moderate flare topical medicaion prescribed

## 2019-06-29 ENCOUNTER — Encounter (HOSPITAL_COMMUNITY): Payer: Self-pay | Admitting: Nurse Practitioner

## 2019-07-01 ENCOUNTER — Encounter (HOSPITAL_COMMUNITY): Payer: Self-pay | Admitting: Nurse Practitioner

## 2019-07-02 ENCOUNTER — Other Ambulatory Visit (HOSPITAL_COMMUNITY): Payer: Self-pay

## 2019-07-02 ENCOUNTER — Other Ambulatory Visit (HOSPITAL_COMMUNITY): Payer: Self-pay | Admitting: Hematology

## 2019-07-02 ENCOUNTER — Other Ambulatory Visit (HOSPITAL_COMMUNITY): Payer: Self-pay | Admitting: *Deleted

## 2019-07-02 DIAGNOSIS — C159 Malignant neoplasm of esophagus, unspecified: Secondary | ICD-10-CM

## 2019-07-02 DIAGNOSIS — C61 Malignant neoplasm of prostate: Secondary | ICD-10-CM

## 2019-07-02 DIAGNOSIS — Z716 Tobacco abuse counseling: Secondary | ICD-10-CM

## 2019-07-03 ENCOUNTER — Other Ambulatory Visit: Payer: Self-pay

## 2019-07-03 ENCOUNTER — Other Ambulatory Visit (HOSPITAL_COMMUNITY): Payer: Self-pay | Admitting: Nurse Practitioner

## 2019-07-03 ENCOUNTER — Inpatient Hospital Stay (HOSPITAL_COMMUNITY): Payer: Medicaid Other | Attending: Hematology

## 2019-07-03 ENCOUNTER — Ambulatory Visit (HOSPITAL_COMMUNITY)
Admission: RE | Admit: 2019-07-03 | Discharge: 2019-07-03 | Disposition: A | Discharge status: Home / Self Care | Payer: Medicaid Other | Source: Ambulatory Visit | Attending: Hematology | Admitting: Hematology

## 2019-07-03 DIAGNOSIS — I10 Essential (primary) hypertension: Secondary | ICD-10-CM | POA: Insufficient documentation

## 2019-07-03 DIAGNOSIS — Z716 Tobacco abuse counseling: Secondary | ICD-10-CM

## 2019-07-03 DIAGNOSIS — Z923 Personal history of irradiation: Secondary | ICD-10-CM | POA: Diagnosis not present

## 2019-07-03 DIAGNOSIS — Z79899 Other long term (current) drug therapy: Secondary | ICD-10-CM | POA: Diagnosis not present

## 2019-07-03 DIAGNOSIS — Z9221 Personal history of antineoplastic chemotherapy: Secondary | ICD-10-CM | POA: Insufficient documentation

## 2019-07-03 DIAGNOSIS — C159 Malignant neoplasm of esophagus, unspecified: Secondary | ICD-10-CM | POA: Diagnosis not present

## 2019-07-03 DIAGNOSIS — F17210 Nicotine dependence, cigarettes, uncomplicated: Secondary | ICD-10-CM | POA: Insufficient documentation

## 2019-07-03 DIAGNOSIS — Z5111 Encounter for antineoplastic chemotherapy: Secondary | ICD-10-CM | POA: Diagnosis not present

## 2019-07-03 DIAGNOSIS — C61 Malignant neoplasm of prostate: Secondary | ICD-10-CM | POA: Insufficient documentation

## 2019-07-03 LAB — CBC WITH DIFFERENTIAL/PLATELET
Abs Immature Granulocytes: 0.04 10*3/uL (ref 0.00–0.07)
Basophils Absolute: 0 10*3/uL (ref 0.0–0.1)
Basophils Relative: 1 %
Eosinophils Absolute: 0.1 10*3/uL (ref 0.0–0.5)
Eosinophils Relative: 1 %
HCT: 41.3 % (ref 39.0–52.0)
Hemoglobin: 13.5 g/dL (ref 13.0–17.0)
Immature Granulocytes: 1 %
Lymphocytes Relative: 17 %
Lymphs Abs: 1.5 10*3/uL (ref 0.7–4.0)
MCH: 28.7 pg (ref 26.0–34.0)
MCHC: 32.7 g/dL (ref 30.0–36.0)
MCV: 87.7 fL (ref 80.0–100.0)
Monocytes Absolute: 1.1 10*3/uL — ABNORMAL HIGH (ref 0.1–1.0)
Monocytes Relative: 13 %
Neutro Abs: 6 10*3/uL (ref 1.7–7.7)
Neutrophils Relative %: 67 %
Platelets: 185 10*3/uL (ref 150–400)
RBC: 4.71 MIL/uL (ref 4.22–5.81)
RDW: 15.4 % (ref 11.5–15.5)
WBC: 8.8 10*3/uL (ref 4.0–10.5)
nRBC: 0 % (ref 0.0–0.2)

## 2019-07-03 LAB — COMPREHENSIVE METABOLIC PANEL
ALT: 32 U/L (ref 0–44)
AST: 19 U/L (ref 15–41)
Albumin: 4.3 g/dL (ref 3.5–5.0)
Alkaline Phosphatase: 84 U/L (ref 38–126)
Anion gap: 8 (ref 5–15)
BUN: 23 mg/dL (ref 8–23)
CO2: 26 mmol/L (ref 22–32)
Calcium: 9.1 mg/dL (ref 8.9–10.3)
Chloride: 105 mmol/L (ref 98–111)
Creatinine, Ser: 1.11 mg/dL (ref 0.61–1.24)
GFR calc Af Amer: >60 mL/min (ref >60)
GFR calc non Af Amer: >60 mL/min (ref >60)
Glucose, Bld: 109 mg/dL — ABNORMAL HIGH (ref 70–99)
Potassium: 4.4 mmol/L (ref 3.5–5.1)
Sodium: 139 mmol/L (ref 135–145)
Total Bilirubin: 0.8 mg/dL (ref 0.3–1.2)
Total Protein: 7.7 g/dL (ref 6.5–8.1)

## 2019-07-03 LAB — TSH: TSH: 0.756 u[IU]/mL (ref 0.350–4.500)

## 2019-07-03 LAB — LACTATE DEHYDROGENASE: LDH: 165 U/L (ref 98–192)

## 2019-07-03 MED ORDER — IOHEXOL 300 MG/ML  SOLN
100.0000 mL | Freq: Once | INTRAMUSCULAR | Status: AC | PRN
  Administered 2019-07-03: 10:00:00 100 mL via INTRAVENOUS

## 2019-07-06 ENCOUNTER — Inpatient Hospital Stay (HOSPITAL_BASED_OUTPATIENT_CLINIC_OR_DEPARTMENT_OTHER): Payer: Medicaid Other | Admitting: Nurse Practitioner

## 2019-07-06 ENCOUNTER — Other Ambulatory Visit: Payer: Self-pay

## 2019-07-06 DIAGNOSIS — C159 Malignant neoplasm of esophagus, unspecified: Secondary | ICD-10-CM | POA: Diagnosis not present

## 2019-07-06 NOTE — Patient Instructions (Signed)
[facility] Discharge Instructions  Follow up in 6 months with labs    Thank you for choosing  Cancer Center at [facility] to provide your oncology and hematology care.  To afford each patient quality time with our provider, please arrive at least 15 minutes before your scheduled appointment time.   If you have a lab appointment with the Cancer Center please come in thru the Main Entrance and check in at the main information desk.  You need to re-schedule your appointment should you arrive 10 or more minutes late.  We strive to give you quality time with our providers, and arriving late affects you and other patients whose appointments are after yours.  Also, if you no show three or more times for appointments you may be dismissed from the clinic at the providers discretion.     Again, thank you for choosing [facility].  Our hope is that these requests will decrease the amount of time that you wait before being seen by our physicians.       _____________________________________________________________  Should you have questions after your visit to [facility], please contact our office at [phone] between the hours of 8:00 a.m. and 4:30 p.m.  Voicemails left after 4:00 p.m. will not be returned until the following business day.  For prescription refill requests, have your pharmacy contact our office and allow 72 hours.    Due to Covid, you will need to wear a mask upon entering the hospital. If you do not have a mask, a mask will be given to you at the Main Entrance upon arrival. For doctor visits, patients may have 1 support person with them. For treatment visits, patients can not have anyone with them due to social distancing guidelines and our immunocompromised population.      

## 2019-07-06 NOTE — Progress Notes (Signed)
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  Gleason 3+3 = 6 pattern.   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 Laural Golden.   04/16/2016 Pathology Results   Esophagus, biopsy - INVASIVE POORLY DIFFERENTIATED SQUAMOUS CELL CARCINOMA.   04/26/2016 Imaging   CT CAP- 1. Prominent gastroesophageal junction without discrete esophageal mass. Notably, there is also a borderline enlarged enhancing lymph node in the gastrohepatic ligament immediately adjacent to the gastroesophageal junction, which is nonspecific, but could indicate early nodal disease. 2. The small cluster of low-attenuation lesions in segment 7 of the liver are too small to characterize. Statistically, these are likely to represent cysts. If there is clinical concern for metastatic disease to the liver, these could be further characterized with MRI of the abdomen with and without IV gadolinium at this time. 3. No other potential sites of metastatic disease noted elsewhere in the chest, abdomen or pelvis.   05/10/2016 PET scan   Hypermetabolic bilateral lower thoracic esophageal mass, consistent with known primary esophageal carcinoma.  Small hypermetabolic lymph node in the upper gastrohepatic ligament adjacent to GE junction, consistent with lymph node metastasis.  No other sites of  metastatic disease identified.   05/31/2016 Procedure   Port placed by Dr. Rosana Hoes   06/02/2016 - 07/14/2016 Chemotherapy   Weekly Carboplatin/Paclitaxel with XRT.  Tolerated extremely well.    06/02/2016 - 07/14/2016 Radiation Therapy   [place], [place].  Tolerated well.   08/23/2016 PET scan   1. Complete metabolic response. No residual hypermetabolism in the thoracic esophagus or in the upper gastrohepatic ligament lymph node, which is decreased in size. 2. Aortic atherosclerosis. Three-vessel coronary atherosclerosis.   08/24/2016 Miscellaneous   CTS consult with Dr. Servando Snare- "I've recommended to him that surgical resection offers the best chance of cure, if he waits and there is recurrent disease remaining no longer be a surgical candidate. From his discussion today I think he is not inclined to agree but it has been offered."   03/09/2017 Imaging   CT CAP: 1. Mild distal esophageal and GE junction thickening, which appears slightly more prominent compared with PET-CT 08/20/2016. Correlation with PET-CT suggested. 2. Negative for pulmonary nodules or metastatic disease to the chest. 3. Stable subcentimeter posterior right hepatic lobe hypodensities. No definite evidence for metastatic disease to the abdomen or pelvis   04/28/2017 PET scan   No evidence of recurrent or metastatic carcinoma.      CANCER STAGING: Cancer Staging Squamous cell carcinoma of esophagus (HCC) Staging form: Esophagus - Squamous Cell Carcinoma, AJCC 8th Edition - Clinical stage from 06/15/2016: Stage Unknown (cTX, cN1, cM0, L: Lower) - Signed by Baird Cancer, PA-C on 06/15/2016    INTERVAL HISTORY:  Mr. Deras 64 y.o. male returns for routine follow-up for esophageal squamous cell carcinoma.  Patient reports he has been doing well since last visit he denies any dysphagia or  regurgitation of food.  He denies any abdominal pain. Denies any nausea, vomiting, or diarrhea. Denies any new pains. Had not noticed  any recent bleeding such as epistaxis, hematuria or hematochezia. Denies recent chest pain on exertion, shortness of breath on minimal exertion, pre-syncopal episodes, or palpitations. Denies any numbness or tingling in hands or feet. Denies any recent fevers, infections, or recent hospitalizations. Patient reports appetite at 100% and energy level at 50%.  He is eating well maintain his weight at this time.    REVIEW OF SYSTEMS:  Review of Systems  Neurological: Positive for numbness.  All other systems reviewed and are negative.    PAST MEDICAL/SURGICAL HISTORY:  Past Medical History:  Diagnosis Date  . 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 Anxiety   . Arthritis   . Cirrhosis, alcoholic ([place])   . Clubbing of fingers    congenital  . Depression   . Full dentures   . History of PSVT (paroxysmal supraventricular tachycardia)    run of non-sustatined VT 03-10-2015 in setting of alcohol withdrawal in hospital  . History of seizure    03-08-2012  alcohol withdrawal  . History of thrombocytopenia    10/ 2013  in setting of alcohol withdrawal  . Hyperlipidemia   . Hypertension   . Prostate cancer [facility]) urologist-  dr dalhstedt/  oncologist- dr Tammi Klippel   T1c, Gleason 3+3,  PSA 8.89,  vol 27cc  . 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 BIOPSY  04/14/2016   Procedure: BIOPSY;  Surgeon: Rogene Houston, MD;  [facility];  Service: Endoscopy;;  esophagus  . COLONOSCOPY N/A 04/14/2016   Procedure: COLONOSCOPY;  Surgeon: Rogene Houston, MD;  [facility];  Service: Endoscopy;  Laterality: N/A;  . ESOPHAGOGASTRODUODENOSCOPY N/A 04/14/2016   Procedure: ESOPHAGOGASTRODUODENOSCOPY (EGD);  Surgeon: Rogene Houston, MD;  [facility];  Service: Endoscopy;   Laterality: N/A;  1:00  . NO PAST SURGERIES    . PORTACATH PLACEMENT Right 05/31/2016   Procedure: INSERTION OF TUNNELED RIGHT INTERNAL JUGULAR BARD POWERPORT CENTRAL VENOUS CATHETER WITH SUBCUTANEOUS PORT;  Surgeon: Vickie Epley, MD;  Location: [facility];  Service: Vascular;  Laterality: Right;  . RADIOACTIVE SEED IMPLANT N/A 09/20/2014   Procedure: RADIOACTIVE SEED IMPLANT;  Surgeon: Franchot Gallo, MD;  Location[facility];  Service: Urology;  Laterality: N/A;    77   seeds implanted no seeds found in bladder  . TRANSURETHRAL RESECTION OF PROSTATE       SOCIAL HISTORY:  Social History   Socioeconomic History  . Marital status: Single    Spouse name: Not on file  . Number of children: Not on file  . Years of education: Not on file  . Highest education level: Not on file  Occupational History  . Not on file  Tobacco Use  . Smoking status: Former Smoker    Packs/day: 0.50    Years: 20.00    Pack years: 10.00    Types: Cigarettes  . Smokeless tobacco: Former Systems developer    Types: Chew    Quit date: 05/27/1984  . Tobacco comment: quit 12/06/18  Substance and Sexual Activity  . Alcohol use: Not on file    Comment: quit drinking in October 2015.  He says before this he says he stayed drunk every day.   . Drug use: No    Comment: last use october 2015  . Sexual activity: Never    Birth control/protection: None  Other Topics Concern  . Not on file  Social History Narrative  . Not on file   Social Determinants of Health   Financial Resource Strain:   . Difficulty of Paying Living Expenses: Not on file  Food Insecurity:   . Worried About Charity fundraiser in the Last Year: Not on file  . Ran Out of Food in the Last Year: Not on file  Transportation Needs:   . Lack of Transportation (Medical): Not on file  . Lack of Transportation (Non-Medical): Not on file  Physical Activity:   . Days of Exercise per Week: Not on file  . Minutes of Exercise per Session: Not on  file  Stress:   . Feeling of Stress : Not on file  Social Connections:   . Frequency of Communication with Friends and Family: Not on file  . Frequency of Social Gatherings with Friends and Family: Not on file  . Attends Religious Services: Not on file  . Active Member of Clubs or Organizations: Not on file  . Attends Archivist Meetings: Not on file  . Marital Status: Not on file  Intimate Partner Violence:   . Fear of Current or Ex-Partner: Not on file  . Emotionally Abused: Not on file  . Physically Abused: Not on file  . Sexually Abused: Not on file    FAMILY HISTORY:  Family History  Problem Relation Age of Onset  . Alcohol abuse Mother   . Diabetes Sister     CURRENT MEDICATIONS:  Outpatient Encounter Medications as of 07/06/2019  Medication Sig  . amLODipine (NORVASC) 10 MG tablet TAKE 1 TABLET BY MOUTH DAILY  . tamsulosin (FLOMAX) 0.4 MG CAPS capsule Take 1 capsule (0.4 mg total) by mouth daily.  [place] Kitchen triamcinolone cream (KENALOG) 0.1 % APPLY EXTERNALLY TO THE AFFECTED AREA TWICE DAILY   No facility-administered encounter medications on file as of 07/06/2019.    ALLERGIES:  No Known Allergies   PHYSICAL EXAM:  ECOG Performance status: 1  Vitals:   07/06/19 1135  BP: 139/76  Pulse: 91  Resp: 18  Temp: (!) 97.3 F (36.3 C)  SpO2: 100%   Filed Weights   07/06/19 1135  Weight: 172 lb (78 kg)    Physical Exam Constitutional:      Appearance: Normal appearance. He is normal weight.  Cardiovascular:     Rate and Rhythm: Normal rate and regular rhythm.     Heart sounds: Normal heart sounds.  Pulmonary:     Effort: Pulmonary effort is normal.     Breath sounds: Normal breath sounds.  Abdominal:     General: Bowel sounds are normal.     Palpations: Abdomen is soft.  Musculoskeletal:        General: Normal range of motion.  Skin:    General: Skin is warm.  Neurological:     Mental Status: He is alert and oriented to person, place, and time.  Mental status is at baseline.  Psychiatric:        Mood and Affect: Mood normal.        Behavior: Behavior normal.        Thought Content: Thought content normal.        Judgment: Judgment normal.      LABORATORY DATA:  I have reviewed the labs as listed.  CBC    Component Value Date/Time   WBC 8.8 07/03/2019 0916   RBC 4.71 07/03/2019 0916   HGB 13.5 07/03/2019 0916   HCT 41.3 07/03/2019 0916   PLT 185 07/03/2019 0916   MCV 87.7 07/03/2019 0916   MCH 28.7 07/03/2019 0916   MCHC 32.7 07/03/2019 0916   RDW 15.4 07/03/2019 0916   LYMPHSABS 1.5 07/03/2019 0916   MONOABS 1.1 (H) 07/03/2019 0916   EOSABS 0.1 07/03/2019 0916   BASOSABS 0.0 07/03/2019 0916   CMP Latest Ref Rng & Units 07/03/2019 04/09/2019 12/20/2018  Glucose 70 - 99 mg/dL 109(H) 98 89  BUN 8 - 23 mg/dL 23 16 19   Creatinine 0.61 - 1.24 mg/dL 1.11 1.22 1.01  Sodium 135 - 145 mmol/L 139 139 136  Potassium 3.5 - 5.1 mmol/L 4.4 4.2 3.9  Chloride 98 - 111 mmol/L 105 104 105  CO2 22 - 32 mmol/L 26 29 24   Calcium 8.9 - 10.3 mg/dL 9.1 9.4 8.7(L)  Total Protein 6.5 - 8.1 g/dL 7.7 7.2 6.9  Total Bilirubin 0.3 - 1.2 mg/dL 0.8 0.7 0.6  Alkaline Phos 38 - 126 U/L 84 - 66  AST 15 - 41 U/L 19 21 23   ALT 0 - 44 U/L 32 34 44   DIAGNOSTIC IMAGING:  I have independently reviewed the CT scans and discussed with the patient.   I personally performed a face-to-face visit.  All questions were answered to patient's stated satisfaction. Encouraged patient to call with any new concerns or questions before his next visit to the cancer center and we can certain see him sooner, if needed.     ASSESSMENT & PLAN:   Squamous cell carcinoma of esophagus (HCC) 1.  Squamous cell carcinoma the GE junction: -Status post chemoradiation therapy with weekly carboplatin/paclitaxel from 06/02/2016 through 07/14/2016. -Refused esophagectomy. -CT scans so far have been negative. -He denies any dysphagia or odynophagia.  No regurgitation of food  reported. -His weight has been stable. -CT CAP dated 05/25/2018 shows stable exam. -CT chest and abdomen on 12/20/2018 did not show any evidence of recurrent or metastatic disease.  Hypodensity liver lesions continue to be stable. -CT CAP on 07/03/2019 did not show any evidence of recurrent or metastatic disease.  Stable small low-density lesions in the posterior right hepatic lobe appear benign. -Labs done on 07/03/2019 were all WNL. -He will follow-up with labs in 6 months.  2.  Smoking cessation: -Patient originally stop smoking on 12/06/2018. -He admits he started back smoking in the third week of January 2021.  He reports he only smokes 3 to 5 cigarettes a day. -He reports he will stop smoking before 33-month follow-up.      Orders placed this encounter:  Orders Placed This Encounter  Procedures  . Lactate dehydrogenase  . CBC with Differential/Platelet  . Comprehensive metabolic panel  . Vitamin B12  . VITAMIN D 25 Hydroxy (Vit-D Deficiency, Fractures)  . TSH      Francene Finders, FNP-C [employer] [phone]

## 2019-07-06 NOTE — Assessment & Plan Note (Signed)
1.  Squamous cell carcinoma the GE junction: -Status post chemoradiation therapy with weekly carboplatin/paclitaxel from 06/02/2016 through 07/14/2016. -Refused esophagectomy. -CT scans so far have been negative. -He denies any dysphagia or odynophagia.  No regurgitation of food reported. -His weight has been stable. -CT CAP dated 05/25/2018 shows stable exam. -CT chest and abdomen on 12/20/2018 did not show any evidence of recurrent or metastatic disease.  Hypodensity liver lesions continue to be stable. -CT CAP on 07/03/2019 did not show any evidence of recurrent or metastatic disease.  Stable small low-density lesions in the posterior right hepatic lobe appear benign. -Labs done on 07/03/2019 were all WNL. -He will follow-up with labs in 6 months.  2.  Smoking cessation: -Patient originally stop smoking on 12/06/2018. -He admits he started back smoking in the third week of January 2021.  He reports he only smokes 3 to 5 cigarettes a day. -He reports he will stop smoking before 55-month follow-up.

## 2019-08-16 NOTE — Progress Notes (Signed)
Cardiology Office Note   Date:  08/20/2019   ID:  Verdon, Grimaud 19-Nov-1955, MRN [id number]  PCP:  [name], MD  Cardiologist:   Jenkins Rouge, MD   No chief complaint on file.     History of Present Illness: Stanley Jimenez is a 64 y.o. male who presents for f/u regarding HTN, smoking and abnormal chest CT.  We have not seen hm in 3 years  He has esophageal cance. CT done 04/26/16 commented on aortic atherosclerosis and calcium seen in LM and all 3 major coronary arteries. Previous alcoholic. Distant history of PSVT in setting of ETOH withdrawal in 2016 CRF;s include smoking, HTN and HL.He is active waling his two dogs. No chest pain dyspnea palpitations. No longer drinking but resumed smoking in January less than 1/2 ppd   He is post XRT/chemo and refused surgery for cancer Fortunately doing ok and CT 07/03/19 with no metastatic disease  He has a [name] and [place] and does carpentry work at [employer] with no chest pain Does have some bilateral ? Claudication    Past Medical History:  Diagnosis Date  . 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 Anxiety   . Arthritis   . Cirrhosis, alcoholic ([place])   . Clubbing of fingers    congenital  . Depression   . Full dentures   . History of PSVT (paroxysmal supraventricular tachycardia)    run of non-sustatined VT 03-10-2015 in setting of alcohol withdrawal in hospital  . History of seizure    03-08-2012  alcohol withdrawal  . History of thrombocytopenia    10/ 2013  in setting of alcohol withdrawal  . Hyperlipidemia   . Hypertension   . Prostate cancer [facility]) urologist-  dr dalhstedt/  oncologist- dr Tammi Klippel   T1c, Gleason 3+3,  PSA 8.89,  vol 27cc  . 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 BIOPSY  04/14/2016    Procedure: BIOPSY;  Surgeon: Rogene Houston, MD;  [facility];  Service: Endoscopy;;  esophagus  . COLONOSCOPY N/A 04/14/2016   Procedure: COLONOSCOPY;  Surgeon: Rogene Houston, MD;  [facility];  Service: Endoscopy;  Laterality: N/A;  . ESOPHAGOGASTRODUODENOSCOPY N/A 04/14/2016   Procedure: ESOPHAGOGASTRODUODENOSCOPY (EGD);  Surgeon: Rogene Houston, MD;  [facility];  Service: Endoscopy;  Laterality: N/A;  1:00  . NO PAST SURGERIES    . PORTACATH PLACEMENT Right 05/31/2016   Procedure: INSERTION OF TUNNELED RIGHT INTERNAL JUGULAR BARD POWERPORT CENTRAL VENOUS CATHETER WITH SUBCUTANEOUS PORT;  Surgeon: Vickie Epley, MD;  Location: [facility];  Service: Vascular;  Laterality: Right;  . RADIOACTIVE SEED IMPLANT N/A 09/20/2014   Procedure: RADIOACTIVE SEED IMPLANT;  Surgeon: Franchot Gallo, MD;  Location[facility];  Service: Urology;  Laterality: N/A;    77   seeds implanted no seeds found in bladder  . TRANSURETHRAL RESECTION OF PROSTATE       Current Outpatient Medications  Medication Sig Dispense Refill  . amLODipine (NORVASC) 10 MG tablet TAKE 1 TABLET BY MOUTH DAILY 90 tablet 1  . tamsulosin (FLOMAX) 0.4 MG CAPS capsule Take 1 capsule (0.4 mg total) by mouth daily. 90 capsule 1  . triamcinolone cream (KENALOG) 0.1 % APPLY  EXTERNALLY TO THE AFFECTED AREA TWICE DAILY 453.6 g 3   No current facility-administered medications for this visit.    Allergies:   Patient has no known allergies.    Social History:  The patient  reports that he has quit smoking. His smoking use included cigarettes. He has a 10.00 pack-year smoking history. He quit smokeless tobacco use about 35 years ago.  His smokeless tobacco use included chew. He reports that he does not use drugs.   Family History:  The patient's family history includes Alcohol abuse in his mother; Diabetes in his sister.    ROS:  Please see the history of present illness.    Otherwise, review of systems are positive for none.   All other systems are reviewed and negative.    PHYSICAL EXAM: VS:  BP 130/74 (BP Location: Left Arm)   Pulse 91   Temp (!) 96.9 F (36.1 C)   Ht 5' 10.5" (1.791 m)   Wt 172 lb (78 kg)   SpO2 95%   BMI 24.33 kg/m  , BMI Body mass index is 24.33 kg/m. Affect appropriate Thin black male  HEENT: normal Neck supple with no adenopathy JVP normal no bruits no thyromegaly Lungs clear with no wheezing and good diaphragmatic motion Heart:  S1/S21/6 SEM  murmur, no rub, gallop or click  Port a cath under right clavicle  PMI normal Abdomen: benighn, BS positve, no tenderness, no AAA no bruit.  No HSM or HJR Distal pulses hard to palpate bilaterally  No edema  Neuro non-focal Skin warm and dry No muscular weakness   EKG:  05/27/16  SR rate 68 normal ECG  11/18/16 SR rate 94 septal Q lead placement otherwise normal    Recent Labs: 07/03/2019: ALT 32; BUN 23; Creatinine, Ser 1.11; Hemoglobin 13.5; Platelets 185; Potassium 4.4; Sodium 139; TSH 0.756    Lipid Panel    Component Value Date/Time   CHOL 179 04/09/2019 0839   TRIG 93 04/09/2019 0839   HDL 52 04/09/2019 0839   CHOLHDL 3.4 04/09/2019 0839   VLDL 12 10/20/2016 0917   LDLCALC 108 (H) 04/09/2019 0839      Wt Readings from Last 3 Encounters:  08/20/19 172 lb (78 kg)  07/06/19 172 lb (78 kg)  04/09/19 175 lb (79.4 kg)      Other studies Reviewed: Additional studies/ records that were reviewed today include: CT chest 2017 office notes labs and ECG Dr Moshe Cipro.    ASSESSMENT AND PLAN:  1.  CAD seen only on CT. Not unexpected in his age group with smoking Asymptomatic with normal ECG And active. No indication for stress testing   2. HTN Well controlled.  Continue current medications and low sodium Dash type diet.    3. HLD:  diet Rx f/u labs with primary   4. Esophageal cancer :  Squamous cell at GE junction Rx chemoradiation refused surgery has been stable by  CT 07/03/19 with no evidence of metastatic  Disease   5. Smoking  Counseled on cessation no active wheezing f/u primary has CT;s for his esophageal cancer   6. ETOH Abuse:  With history of cirrhosis abstaining   7. PVD:  ? Claudication with check ABI's pulses are a bit hard to palpate    Current medicines are reviewed at length with the patient today.  The patient does not have concerns regarding medicines.  The following changes have been made:  no change  Labs/ tests ordered today include: None   No  orders of the defined types were placed in this encounter.    Disposition:   FU with cardiology in a year     Signed, Jenkins Rouge, MD  08/20/2019 2:43 PM    [address], [place], [place]  [address] Phone: [phone]; Fax: [phone]

## 2019-08-20 ENCOUNTER — Telehealth: Payer: Self-pay | Admitting: Cardiovascular Disease

## 2019-08-20 ENCOUNTER — Ambulatory Visit (INDEPENDENT_AMBULATORY_CARE_PROVIDER_SITE_OTHER): Payer: Medicaid Other | Admitting: Cardiovascular Disease

## 2019-08-20 ENCOUNTER — Encounter: Payer: Self-pay | Admitting: Cardiovascular Disease

## 2019-08-20 ENCOUNTER — Other Ambulatory Visit: Payer: Self-pay

## 2019-08-20 VITALS — BP 130/74 | HR 91 | Temp 96.9°F | Ht 70.5 in | Wt 172.0 lb

## 2019-08-20 DIAGNOSIS — I739 Peripheral vascular disease, unspecified: Secondary | ICD-10-CM | POA: Diagnosis not present

## 2019-08-20 DIAGNOSIS — I2510 Atherosclerotic heart disease of native coronary artery without angina pectoris: Secondary | ICD-10-CM | POA: Diagnosis not present

## 2019-08-20 NOTE — Telephone Encounter (Signed)
Pre-cert Verification for the following procedure    US ARTERIAL SEG MULTIPLE-60  DATE:   08/28/2019  [place]

## 2019-08-20 NOTE — Patient Instructions (Signed)
Medication Instructions:  Your physician recommends that you continue on your current medications as directed. Please refer to the Current Medication list given to you today.   Labwork: NONE  Testing/Procedures: Your physician has requested that you have an ankle brachial index (ABI). During this test an ultrasound and blood pressure cuff are used to evaluate the arteries that supply the arms and legs with blood. Allow thirty minutes for this exam. There are no restrictions or special instructions.    Follow-Up: Your physician wants you to follow-up in: 1 YEAR.  You will receive a reminder letter in the mail two months in advance. If you don't receive a letter, please call our office to schedule the follow-up appointment.   Any Other Special Instructions Will Be Listed Below (If Applicable).     If you need a refill on your cardiac medications before your next appointment, please call your pharmacy.

## 2019-08-28 ENCOUNTER — Other Ambulatory Visit: Payer: Self-pay

## 2019-08-28 ENCOUNTER — Ambulatory Visit (HOSPITAL_COMMUNITY)
Admission: RE | Admit: 2019-08-28 | Discharge: 2019-08-28 | Disposition: A | Discharge status: Home / Self Care | Payer: Medicaid Other | Source: Ambulatory Visit | Attending: Cardiovascular Disease | Admitting: Cardiovascular Disease

## 2019-08-28 DIAGNOSIS — I739 Peripheral vascular disease, unspecified: Secondary | ICD-10-CM

## 2019-08-29 ENCOUNTER — Telehealth: Payer: Self-pay

## 2019-08-29 DIAGNOSIS — I739 Peripheral vascular disease, unspecified: Secondary | ICD-10-CM

## 2019-08-29 NOTE — Telephone Encounter (Signed)
-----   Message from Bernita Raisin, RN sent at 08/28/2019  3:45 PM EDT -----  ----- Message ----- From: Michaelyn Barter, RN Sent: 08/28/2019   3:35 PM EDT To: [facility] Triage  Will send to [facility]

## 2019-08-29 NOTE — Telephone Encounter (Signed)
Pt. Made aware of test result. Referral placed for Dr. Fletcher Anon for PVD per Dr. Johnsie Cancel

## 2019-09-10 ENCOUNTER — Ambulatory Visit: Payer: Medicaid Other | Admitting: Family Medicine

## 2019-09-11 ENCOUNTER — Ambulatory Visit: Payer: Medicaid Other | Admitting: Cardiovascular Disease

## 2019-10-02 ENCOUNTER — Other Ambulatory Visit: Payer: Self-pay

## 2019-10-02 ENCOUNTER — Encounter (INDEPENDENT_AMBULATORY_CARE_PROVIDER_SITE_OTHER): Payer: Self-pay

## 2019-10-02 ENCOUNTER — Ambulatory Visit: Payer: Medicaid Other | Admitting: Cardiovascular Disease

## 2019-10-02 VITALS — BP 130/60 | HR 72 | Ht 70.5 in | Wt 169.4 lb

## 2019-10-02 DIAGNOSIS — E785 Hyperlipidemia, unspecified: Secondary | ICD-10-CM

## 2019-10-02 DIAGNOSIS — I739 Peripheral vascular disease, unspecified: Secondary | ICD-10-CM | POA: Diagnosis not present

## 2019-10-02 DIAGNOSIS — Z720 Tobacco use: Secondary | ICD-10-CM

## 2019-10-02 MED ORDER — ASPIRIN EC 81 MG PO TBEC
81.0000 mg | DELAYED_RELEASE_TABLET | Freq: Every day | ORAL | 3 refills | Status: DC
Start: 2019-10-02 — End: 2021-03-17

## 2019-10-02 MED ORDER — ROSUVASTATIN CALCIUM 20 MG PO TABS
20.0000 mg | ORAL_TABLET | Freq: Every day | ORAL | 3 refills | Status: DC
Start: 2019-10-02 — End: 2019-11-06

## 2019-10-02 NOTE — Progress Notes (Signed)
Cardiology Office Note   Date:  10/02/2019   ID:  Stanley Jimenez, DOB 1955-06-26, MRN [id number]  PCP:  [name], MD  Cardiologist:  Dr. Johnsie Cancel  No chief complaint on file.     History of Present Illness: Stanley Jimenez is a 64 y.o. male who was referred by Dr. Johnsie Cancel for evaluation management of peripheral arterial disease.  He has known history of essential hypertension, tobacco use and coronary/aortic atherosclerosis noted on previous CT. Previous CT scan showed evidence of three-vessel coronary artery vascularization as well as aortic atherosclerosis.  He has history of previous heavy alcohol use.  He has history of esophageal cancer status post radiation and chemotherapy.  He reports that he quit alcohol drinking about 6 years ago.  He was told at some point that he had liver cirrhosis but he has not had any issues lately with this.  He reports bilateral calf claudication that started about 2 years ago.  This happens after walking about 1-2 blocks.  Occasionally he has to stop and rest for few minutes before he can resume but the majority of the time he is able to continue walking and symptoms usually improves.  He has no rest pain or lower extremity ulceration.  He smokes on and off and has been able to quit at some point. He underwent an ABI done in [place] which was mildly/moderately reduced bilaterally in the 0.7 range.   Past Medical History:  Diagnosis Date  . 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 Anxiety   . Arthritis   . Cirrhosis, alcoholic ([place])   . Clubbing of fingers    congenital  . Depression   . Full dentures   . History of PSVT (paroxysmal supraventricular tachycardia)    run of non-sustatined VT 03-10-2015 in setting of alcohol withdrawal in hospital  . History of seizure    03-08-2012  alcohol withdrawal  . History of thrombocytopenia    10/ 2013  in setting of alcohol withdrawal  . Hyperlipidemia   . Hypertension   .  Prostate cancer [facility]) urologist-  dr dalhstedt/  oncologist- dr Tammi Klippel   T1c, Gleason 3+3,  PSA 8.89,  vol 27cc  . 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 BIOPSY  04/14/2016   Procedure: BIOPSY;  Surgeon: Rogene Houston, MD;  [facility];  Service: Endoscopy;;  esophagus  . COLONOSCOPY N/A 04/14/2016   Procedure: COLONOSCOPY;  Surgeon: Rogene Houston, MD;  Location[facility];  Service: Endoscopy;  Laterality: N/A;  . ESOPHAGOGASTRODUODENOSCOPY N/A 04/14/2016   Procedure: ESOPHAGOGASTRODUODENOSCOPY (EGD);  Surgeon: Rogene Houston, MD;  Location: [facility];  Service: Endoscopy;  Laterality: N/A;  1:00  . NO PAST SURGERIES    . PORTACATH PLACEMENT Right 05/31/2016   Procedure: INSERTION OF TUNNELED RIGHT INTERNAL JUGULAR BARD POWERPORT CENTRAL VENOUS CATHETER WITH SUBCUTANEOUS PORT;  Surgeon: Vickie Epley, MD;  Location: [facility];  Service: Vascular;  Laterality: Right;  . RADIOACTIVE SEED IMPLANT N/A 09/20/2014   Procedure: RADIOACTIVE SEED IMPLANT;  Surgeon: Franchot Gallo, MD;  Location: [facility];  Service: Urology;  Laterality: N/A;    77   seeds implanted no seeds found in bladder  . TRANSURETHRAL RESECTION OF PROSTATE  Current Outpatient Medications  Medication Sig Dispense Refill  . amLODipine (NORVASC) 10 MG tablet TAKE 1 TABLET BY MOUTH DAILY 90 tablet 1  . tamsulosin (FLOMAX) 0.4 MG CAPS capsule Take 1 capsule (0.4 mg total) by mouth daily. 90 capsule 1  . triamcinolone cream (KENALOG) 0.1 % APPLY EXTERNALLY TO THE AFFECTED AREA TWICE DAILY 453.6 g 3   No current facility-administered medications for this visit.    Allergies:   Patient has no known allergies.    Social History:  The patient  reports that he has quit smoking. His smoking  use included cigarettes. He has a 10.00 pack-year smoking history. He quit smokeless tobacco use about 35 years ago.  His smokeless tobacco use included chew. He reports that he does not use drugs.   Family History:  The patient's family history includes Alcohol abuse in his mother; Diabetes in his sister.    ROS:  Please see the history of present illness.   Otherwise, review of systems are positive for none.   All other systems are reviewed and negative.    PHYSICAL EXAM: VS:  BP 130/60   Pulse 72   Ht 5' 10.5" (1.791 m)   Wt 169 lb 6.4 oz (76.8 kg)   SpO2 99%   BMI 23.96 kg/m  , BMI Body mass index is 23.96 kg/m. GEN: Well nourished, well developed, in no acute distress  HEENT: normal  Neck: no JVD, carotid bruits, or masses Cardiac: RRR; no murmurs, rubs, or gallops,no edema  Respiratory:  clear to auscultation bilaterally, normal work of breathing GI: soft, nontender, nondistended, + BS MS: no deformity or atrophy  Skin: warm and dry, no rash Neuro:  Strength and sensation are intact Psych: euthymic mood, full affect Vascular: Femoral pulses +2 bilaterally.  Distal pulses are not palpable.   EKG:  EKG is ordered today. The ekg ordered today demonstrates normal sinus rhythm with significant ST or T wave changes.   Recent Labs: 07/03/2019: ALT 32; BUN 23; Creatinine, Ser 1.11; Hemoglobin 13.5; Platelets 185; Potassium 4.4; Sodium 139; TSH 0.756    Lipid Panel    Component Value Date/Time   CHOL 179 04/09/2019 0839   TRIG 93 04/09/2019 0839   HDL 52 04/09/2019 0839   CHOLHDL 3.4 04/09/2019 0839   VLDL 12 10/20/2016 0917   LDLCALC 108 (H) 04/09/2019 0839      Wt Readings from Last 3 Encounters:  10/02/19 169 lb 6.4 oz (76.8 kg)  08/20/19 172 lb (78 kg)  07/06/19 172 lb (78 kg)        PAD Screen 11/18/2016  Previous PAD dx? No  Previous surgical procedure? No  Pain with walking? Yes  Subsides with rest? Yes  Feet/toe relief with dangling? No  Painful,  non-healing ulcers? No  Extremities discolored? No      ASSESSMENT AND PLAN:  1.  Peripheral arterial disease: Severe bilateral calf claudication due to suspected SFA/popliteal disease based on his physical exam.  I discussed with him management options of claudication and discussed the importance of controlling his risk factors.  We reviewed the options of medical therapy and a walking program versus proceeding with angiography and possible endovascular intervention.  The patient prefers to have procedure only as a last resort.  I asked him to start taking aspirin 81 mg daily.  He is going to start a walking program.  I will obtain lower extremity arterial duplex during his next follow-up in 3 months to see if his disease is  not amenable to endovascular intervention.  2.  Hyperlipidemia: Most recent lipid profile showed an LDL of 108.  Given the presence of peripheral arterial disease and coronary atherosclerosis, there is an indication for treatment with a statin and thus I started him on rosuvastatin 20 mg daily.  Repeat lipid and liver profile in 3 months when he returns for follow-up.  3.  Tobacco use: I discussed with him the importance of complete abstinence    Disposition:   FU with me in 3 months  Signed,  Kathlyn Sacramento, MD  10/02/2019 10:57 AM    [place]

## 2019-10-02 NOTE — Patient Instructions (Addendum)
Medication Instructions:  START Aspirin 81 mg once daily START Rosuvastatin 20 mg once daily  *If you need a refill on your cardiac medications before your next appointment, please call your pharmacy*   Lab Work: None ordered If you have labs (blood work) drawn today and your tests are completely normal, you will receive your results only by: Marland Kitchen MyChart Message (if you have MyChart) OR . A paper copy in the mail If you have any lab test that is abnormal or we need to change your treatment, we will call you to review the results.   Testing/Procedures: Your physician has requested that you have a lower extremity arterial duplex in 3 months. During this test, ultrasound is used to evaluate arterial blood flow in the legs. Allow one hour for this exam. There are no restrictions or special instructions. This will take place at [place], [address].  Your physician has requested that you have an ankle brachial index (ABI) in 3 months. During this test an ultrasound and blood pressure cuff are used to evaluate the arteries that supply the arms and legs with blood. Allow thirty minutes for this exam. There are no restrictions or special instructions. This will take place at [place], [address].    Follow-Up: At [facility], you and your health needs are our priority.  As part of our continuing mission to provide you with exceptional heart care, we have created designated Provider Care Teams.  These Care Teams include your primary Cardiologist (physician) and Advanced Practice Providers (APPs -  Physician Assistants and Nurse Practitioners) who all work together to provide you with the care you need, when you need it.  We recommend signing up for the patient portal called "MyChart".  Sign up information is provided on this After Visit Summary.  MyChart is used to connect with patients for Virtual Visits (Telemedicine).  Patients are able to view lab/test results, encounter notes,  upcoming appointments, etc.  Non-urgent messages can be sent to your provider as well.   To learn more about what you can do with MyChart, go to [email or web address].    Your next appointment:   3 month(s)  The format for your next appointment:   In Person  Provider:   Kathlyn Sacramento, MD   Steps to Quit Smoking Smoking tobacco is the leading cause of preventable death. It can affect almost every organ in the body. Smoking puts you and those around you at risk for developing many serious chronic diseases. Quitting smoking can be difficult, but it is one of the best things that you can do for your health. It is never too late to quit. How do I get ready to quit? When you decide to quit smoking, create a plan to help you succeed. Before you quit:  Pick a date to quit. Set a date within the next 2 weeks to give you time to prepare.  Write down the reasons why you are quitting. Keep this list in places where you will see it often.  Tell your family, friends, and co-workers that you are quitting. Support from your loved ones can make quitting easier.  Talk with your health care provider about your options for quitting smoking.  Find out what treatment options are covered by your health insurance.  Identify people, places, things, and activities that make you want to smoke (triggers). Avoid them. What first steps can I take to quit smoking?  Throw away all cigarettes at home, at work, and  in your car.  Throw away smoking accessories, such as Scientist, research (medical).  Clean your car. Make sure to empty the ashtray.  Clean your home, including curtains and carpets. What strategies can I use to quit smoking? Talk with your health care provider about combining strategies, such as taking medicines while you are also receiving in-person counseling. Using these two strategies together makes you more likely to succeed in quitting than if you used either strategy on its own.  If you are  pregnant or breastfeeding, talk with your health care provider about finding counseling or other support strategies to quit smoking. Do not take medicine to help you quit smoking unless your health care provider tells you to do so. To quit smoking: Quit right away  Quit smoking completely, instead of gradually reducing how much you smoke over a period of time. Research shows that stopping smoking right away is more successful than gradually quitting.  Attend in-person counseling to help you build problem-solving skills. You are more likely to succeed in quitting if you attend counseling sessions regularly. Even short sessions of 10 minutes can be effective. Take medicine You may take medicines to help you quit smoking. Some medicines require a prescription and some you can purchase over-the-counter. Medicines may have nicotine in them to replace the nicotine in cigarettes. Medicines may:  Help to stop cravings.  Help to relieve withdrawal symptoms. Your health care provider may recommend:  Nicotine patches, gum, or lozenges.  Nicotine inhalers or sprays.  Non-nicotine medicine that is taken by mouth. Find resources Find resources and support systems that can help you to quit smoking and remain smoke-free after you quit. These resources are most helpful when you use them often. They include:  Online chats with a Social worker.  Telephone quitlines.  Printed Furniture conservator/restorer.  Support groups or group counseling.  Text messaging programs.  Mobile phone apps or applications. Use apps that can help you stick to your quit plan by providing reminders, tips, and encouragement. There are many free apps for mobile devices as well as websites. Examples include Quit Guide from the [employer] and smokefree.gov What things can I do to make it easier to quit?   Reach out to your family and friends for support and encouragement. Call telephone quitlines (1-800-QUIT-NOW), reach out to support groups, or  work with a counselor for support.  Ask people who smoke to avoid smoking around you.  Avoid places that trigger you to smoke, such as bars, parties, or smoke-break areas at work.  Spend time with people who do not smoke.  Lessen the stress in your life. Stress can be a smoking trigger for some people. To lessen stress, try: ? Exercising regularly. ? Doing deep-breathing exercises. ? Doing yoga. ? Meditating. ? Performing a body scan. This involves closing your eyes, scanning your body from head to toe, and noticing which parts of your body are particularly tense. Try to relax the muscles in those areas. How will I feel when I quit smoking? Day 1 to 3 weeks Within the first 24 hours of quitting smoking, you may start to feel withdrawal symptoms. These symptoms are usually most noticeable 2-3 days after quitting, but they usually do not last for more than 2-3 weeks. You may experience these symptoms:  Mood swings.  Restlessness, anxiety, or irritability.  Trouble concentrating.  Dizziness.  Strong cravings for sugary foods and nicotine.  Mild weight gain.  Constipation.  Nausea.  Coughing or a sore throat.  Changes in  how the medicines that you take for unrelated issues work in your body.  Depression.  Trouble sleeping (insomnia). Week 3 and afterward After the first 2-3 weeks of quitting, you may start to notice more positive results, such as:  Improved sense of smell and taste.  Decreased coughing and sore throat.  Slower heart rate.  Lower blood pressure.  Clearer skin.  The ability to breathe more easily.  Fewer sick days. Quitting smoking can be very challenging. Do not get discouraged if you are not successful the first time. Some people need to make many attempts to quit before they achieve long-term success. Do your best to stick to your quit plan, and talk with your health care provider if you have any questions or concerns. Summary  Smoking tobacco  is the leading cause of preventable death. Quitting smoking is one of the best things that you can do for your health.  When you decide to quit smoking, create a plan to help you succeed.  Quit smoking right away, not slowly over a period of time.  When you start quitting, seek help from your health care provider, family, or friends. This information is not intended to replace advice given to you by your health care provider. Make sure you discuss any questions you have with your health care provider. Document Revised: 02/02/2019 Document Reviewed: 07/29/2018 Elsevier Patient Education  Junior.

## 2019-10-03 ENCOUNTER — Telehealth: Payer: Self-pay | Admitting: Licensed Clinical Social Worker

## 2019-10-03 NOTE — Telephone Encounter (Signed)
CSW received referral to assist patient with transportation. Patient has Medicaid and resides in [place]. He states in most case his brother can bring him to appointments but occasionally needs a ride. CSW informed patient that medicaid provides transport to medical appointments and with 3-5 days notice he can make those arrangements by calling the county social services. Patient verbalizes and states he has used [facility] service in the past. CSW provided supportive intervention and contact info if needed in the future. Patient verbalizes understanding and will reach out if needed. Raquel Sarna, [place], [place]

## 2019-10-25 ENCOUNTER — Other Ambulatory Visit: Payer: Self-pay | Admitting: Family Medicine

## 2019-11-06 ENCOUNTER — Other Ambulatory Visit: Payer: Self-pay

## 2019-11-06 ENCOUNTER — Encounter: Payer: Self-pay | Admitting: Family Medicine

## 2019-11-06 ENCOUNTER — Ambulatory Visit (INDEPENDENT_AMBULATORY_CARE_PROVIDER_SITE_OTHER): Payer: Medicaid Other | Admitting: Family Medicine

## 2019-11-06 VITALS — BP 112/70 | HR 88 | Temp 96.7°F | Resp 16 | Ht 71.0 in | Wt 168.0 lb

## 2019-11-06 DIAGNOSIS — F17210 Nicotine dependence, cigarettes, uncomplicated: Secondary | ICD-10-CM | POA: Insufficient documentation

## 2019-11-06 DIAGNOSIS — L404 Guttate psoriasis: Secondary | ICD-10-CM | POA: Diagnosis not present

## 2019-11-06 DIAGNOSIS — I10 Essential (primary) hypertension: Secondary | ICD-10-CM | POA: Diagnosis not present

## 2019-11-06 DIAGNOSIS — I739 Peripheral vascular disease, unspecified: Secondary | ICD-10-CM

## 2019-11-06 DIAGNOSIS — C61 Malignant neoplasm of prostate: Secondary | ICD-10-CM

## 2019-11-06 DIAGNOSIS — F1021 Alcohol dependence, in remission: Secondary | ICD-10-CM

## 2019-11-06 MED ORDER — AMLODIPINE BESYLATE 10 MG PO TABS: ORAL_TABLET | ORAL | 1 refills | Status: DC

## 2019-11-06 MED ORDER — TAMSULOSIN HCL 0.4 MG PO CAPS: 0.4000 mg | ORAL_CAPSULE | Freq: Every day | ORAL | 1 refills | Status: DC

## 2019-11-06 MED ORDER — ROSUVASTATIN CALCIUM 20 MG PO TABS: 20.0000 mg | ORAL_TABLET | Freq: Every day | ORAL | 3 refills | Status: DC

## 2019-11-06 NOTE — Progress Notes (Signed)
   Stanley Jimenez     MRN: [id number]      DOB: 11/04/1955   HPI Stanley Jimenez is here for follow up and re-evaluation of chronic medical conditions, medication management and review of any available recent lab and radiology data.  Preventive health is updated, specifically  Cancer screening and Immunization.   Questions or concerns regarding consultations or procedures which the PT has had in the interim are  addressed. The PT denies any adverse reactions to current medications since the last visit.  Resumed smoking , smoked 100 cigarettes in the past 4 m months, and also gets from neighbors. Not drinking , had quit for 7 months  ROS Denies recent fever or chills. Denies sinus pressure, nasal congestion, ear pain or sore throat. Denies chest congestion, productive cough or wheezing. Denies chest pains, palpitations and leg swelling Denies abdominal pain, nausea, vomiting,diarrhea or constipation.   Denies dysuria, frequency, hesitancy or incontinence. Denies uncontrolled  joint pain, swelling and limitation in mobility. Denies headaches, seizures, numbness, or tingling. Denies depression, anxiety or insomnia.   PE  BP 112/70   Pulse 88   Temp (!) 96.7 F (35.9 C) (Temporal)   Resp 16   Ht 5\' 11"  (1.803 m)   Wt 168 lb (76.2 kg)   SpO2 97%   BMI 23.43 kg/m   Patient alert and oriented and in no cardiopulmonary distress.  HEENT: No facial asymmetry, EOMI,     Neck supple .  Chest: Clear to auscultation bilaterally.  CVS: S1, S2 no murmurs, no S3.Regular rate.  ABD: Soft non tender.   Ext: No edema  MS: Adequate ROM spine, shoulders, hips and knees.  Skin: Intact,guttate hypopigmented lesions on dorsum of both hands, left greater than right, hyperpigmentation of skin on anterior calves  Psych: Good eye contact, normal affect. Memory intact not anxious or depressed appearing.  CNS: CN 2-12 intact, power,  normal throughout.no focal deficits noted.   Assessment &  Plan  Essential hypertension Controlled, no change in medication DASH diet and commitment to daily physical activity for a minimum of 30 minutes discussed and encouraged, as a part of hypertension management. The importance of attaining a healthy weight is also discussed.  BP/Weight 11/06/2019 10/02/2019 08/20/2019 07/06/2019 04/09/2019 12/22/2018 12/19/209  Systolic BP 155 208 022 336 122 449 753  Diastolic BP 70 60 74 76 78 84 74  Wt. (Lbs) 168 169.4 172 172 175 167.2 163  BMI 23.43 23.96 24.33 24.68 25.11 23.99 23.39       Psoriasis, guttate 1 month h/o rash on hands, left worse than right, using topical steroid  H/O alcohol dependence (HCC) Remains alcohol free  Malignant neoplasm of prostate Followed by Urology  Episodic cigarette smoking dependence Asked:confirms currently smokes cigarettes Assess: Quit date is 11/06/2019 Advise: needs to QUIT to reduce risk of cancer, cardio and cerebrovascular disease Assist: counseled for 5 minutes and literature provided Arrange: follow up in 3 months   PAD (peripheral artery disease) ([place]) Severe in legs,followed by vascular

## 2019-11-06 NOTE — Assessment & Plan Note (Signed)
Severe in legs,followed by vascular

## 2019-11-06 NOTE — Assessment & Plan Note (Signed)
Asked:confirms currently smokes cigarettes Assess: Quit date is 11/06/2019 Advise: needs to QUIT to reduce risk of cancer, cardio and cerebrovascular disease Assist: counseled for 5 minutes and literature provided Arrange: follow up in 3 months

## 2019-11-06 NOTE — Assessment & Plan Note (Signed)
Controlled, no change in medication DASH diet and commitment to daily physical activity for a minimum of 30 minutes discussed and encouraged, as a part of hypertension management. The importance of attaining a healthy weight is also discussed.  BP/Weight 11/06/2019 10/02/2019 08/20/2019 07/06/2019 04/09/2019 12/22/2018 3/66/8159  Systolic BP 470 761 518 343 735 789 784  Diastolic BP 70 60 74 76 78 84 74  Wt. (Lbs) 168 169.4 172 172 175 167.2 163  BMI 23.43 23.96 24.33 24.68 25.11 23.99 23.39

## 2019-11-06 NOTE — Assessment & Plan Note (Signed)
Remains alcohol free.  

## 2019-11-06 NOTE — Patient Instructions (Addendum)
Annual physical exam with MD in mid to end September, call if you need me sooner  PLEASE get covid vaccines,. They help to protect you  TODAY , no more buying cigarettes an feel good saying NO to all cigarette offers. If you do start a cigarette, don't finish it and throw it away, and if you smoke 1 , don't beat up on yourself and feel like a failure and give up   It is important that you exercise regularly at least 30 minutes 5 times a week. If you develop chest pain, have severe difficulty breathing, or feel very tired, stop exercising immediately and seek medical attention  Think about what you will eat, plan ahead. Choose " clean, green, fresh or frozen" over canned, processed or packaged foods which are more sugary, salty and fatty. 70 to 75% of food eaten should be vegetables and fruit. Three meals at set times with snacks allowed between meals, but they must be fruit or vegetables. Aim to eat over a 12 hour period , example 7 am to 7 pm, and STOP after  your last meal of the day. Drink water,generally about 64 ounces per day, no other drink is as healthy. Fruit juice is best enjoyed in a healthy way, by EATING the fruit.

## 2019-11-06 NOTE — Assessment & Plan Note (Signed)
1 month h/o rash on hands, left worse than right, using topical steroid

## 2019-11-06 NOTE — Assessment & Plan Note (Signed)
Followed by Urology 

## 2020-01-01 ENCOUNTER — Other Ambulatory Visit: Payer: Self-pay

## 2020-01-01 ENCOUNTER — Inpatient Hospital Stay (HOSPITAL_COMMUNITY): Payer: Medicaid Other | Attending: Hematology

## 2020-01-01 DIAGNOSIS — Z79899 Other long term (current) drug therapy: Secondary | ICD-10-CM | POA: Insufficient documentation

## 2020-01-01 DIAGNOSIS — F17210 Nicotine dependence, cigarettes, uncomplicated: Secondary | ICD-10-CM | POA: Insufficient documentation

## 2020-01-01 DIAGNOSIS — Z9221 Personal history of antineoplastic chemotherapy: Secondary | ICD-10-CM | POA: Diagnosis not present

## 2020-01-01 DIAGNOSIS — I10 Essential (primary) hypertension: Secondary | ICD-10-CM | POA: Insufficient documentation

## 2020-01-01 DIAGNOSIS — Z923 Personal history of irradiation: Secondary | ICD-10-CM | POA: Diagnosis not present

## 2020-01-01 DIAGNOSIS — K7030 Alcoholic cirrhosis of liver without ascites: Secondary | ICD-10-CM | POA: Diagnosis not present

## 2020-01-01 DIAGNOSIS — C61 Malignant neoplasm of prostate: Secondary | ICD-10-CM | POA: Diagnosis not present

## 2020-01-01 DIAGNOSIS — C159 Malignant neoplasm of esophagus, unspecified: Secondary | ICD-10-CM | POA: Diagnosis present

## 2020-01-01 LAB — CBC WITH DIFFERENTIAL/PLATELET
Abs Immature Granulocytes: 0.03 10*3/uL (ref 0.00–0.07)
Basophils Absolute: 0.1 10*3/uL (ref 0.0–0.1)
Basophils Relative: 1 %
Eosinophils Absolute: 0.2 10*3/uL (ref 0.0–0.5)
Eosinophils Relative: 2 %
HCT: 38.7 % — ABNORMAL LOW (ref 39.0–52.0)
Hemoglobin: 12.4 g/dL — ABNORMAL LOW (ref 13.0–17.0)
Immature Granulocytes: 0 %
Lymphocytes Relative: 34 %
Lymphs Abs: 2.7 10*3/uL (ref 0.7–4.0)
MCH: 28.5 pg (ref 26.0–34.0)
MCHC: 32 g/dL (ref 30.0–36.0)
MCV: 89 fL (ref 80.0–100.0)
Monocytes Absolute: 0.8 10*3/uL (ref 0.1–1.0)
Monocytes Relative: 10 %
Neutro Abs: 4.2 10*3/uL (ref 1.7–7.7)
Neutrophils Relative %: 53 %
Platelets: 208 10*3/uL (ref 150–400)
RBC: 4.35 MIL/uL (ref 4.22–5.81)
RDW: 15.4 % (ref 11.5–15.5)
WBC: 8 10*3/uL (ref 4.0–10.5)
nRBC: 0 % (ref 0.0–0.2)

## 2020-01-01 LAB — VITAMIN B12: Vitamin B-12: 680 pg/mL (ref 180–914)

## 2020-01-01 LAB — LACTATE DEHYDROGENASE: LDH: 165 U/L (ref 98–192)

## 2020-01-01 LAB — COMPREHENSIVE METABOLIC PANEL
ALT: 37 U/L (ref 0–44)
AST: 24 U/L (ref 15–41)
Albumin: 4.3 g/dL (ref 3.5–5.0)
Alkaline Phosphatase: 76 U/L (ref 38–126)
Anion gap: 11 (ref 5–15)
BUN: 15 mg/dL (ref 8–23)
CO2: 19 mmol/L — ABNORMAL LOW (ref 22–32)
Calcium: 8.6 mg/dL — ABNORMAL LOW (ref 8.9–10.3)
Chloride: 108 mmol/L (ref 98–111)
Creatinine, Ser: 1.08 mg/dL (ref 0.61–1.24)
GFR calc Af Amer: >60 mL/min (ref >60)
GFR calc non Af Amer: >60 mL/min (ref >60)
Glucose, Bld: 102 mg/dL — ABNORMAL HIGH (ref 70–99)
Potassium: 3.9 mmol/L (ref 3.5–5.1)
Sodium: 138 mmol/L (ref 135–145)
Total Bilirubin: 0.6 mg/dL (ref 0.3–1.2)
Total Protein: 7.2 g/dL (ref 6.5–8.1)

## 2020-01-01 LAB — TSH: TSH: 0.586 u[IU]/mL (ref 0.350–4.500)

## 2020-01-01 LAB — VITAMIN D 25 HYDROXY (VIT D DEFICIENCY, FRACTURES): Vit D, 25-Hydroxy: 34.98 ng/mL (ref 30–100)

## 2020-01-02 ENCOUNTER — Other Ambulatory Visit: Payer: Self-pay

## 2020-01-02 ENCOUNTER — Ambulatory Visit (HOSPITAL_COMMUNITY)
Admission: RE | Admit: 2020-01-02 | Discharge: 2020-01-02 | Disposition: A | Discharge status: Home / Self Care | Payer: Medicaid Other | Source: Ambulatory Visit | Attending: Cardiology | Admitting: Cardiology

## 2020-01-02 DIAGNOSIS — I739 Peripheral vascular disease, unspecified: Secondary | ICD-10-CM | POA: Insufficient documentation

## 2020-01-08 ENCOUNTER — Other Ambulatory Visit: Payer: Self-pay

## 2020-01-08 ENCOUNTER — Inpatient Hospital Stay (HOSPITAL_BASED_OUTPATIENT_CLINIC_OR_DEPARTMENT_OTHER): Payer: Medicaid Other | Admitting: Hematology

## 2020-01-08 VITALS — BP 131/81 | HR 89 | Temp 98.6°F | Resp 18 | Wt 167.6 lb

## 2020-01-08 DIAGNOSIS — C159 Malignant neoplasm of esophagus, unspecified: Secondary | ICD-10-CM | POA: Diagnosis not present

## 2020-01-08 MED ORDER — CHANTIX STARTING MONTH PAK 0.5 MG X 11 & 1 MG X 42 PO TABS: ORAL_TABLET | ORAL | 0 refills | Status: DC

## 2020-01-08 MED ORDER — VARENICLINE TARTRATE 1 MG PO TABS
1.0000 mg | ORAL_TABLET | Freq: Two times a day (BID) | ORAL | 0 refills | Status: DC
Start: 2020-01-08 — End: 2020-01-15

## 2020-01-08 NOTE — Patient Instructions (Signed)
[place] at [facility] Discharge Instructions  You saw Dr. Delton Coombes today. He is please with how you are doing. He wants you to try and stop smoking. He has sent in a prescription for Chantix. Follow the directions on the bottles.  We will see you back in 6 months with labs and another CT scan of chest abdomen and pelvis.  We will see you after the scans.    Thank you for choosing [place] at [facility] to provide your oncology and hematology care.  To afford each patient quality time with our provider, please arrive at least 15 minutes before your scheduled appointment time.   If you have a lab appointment with the [facility] please come in thru the Main Entrance and check in at the main information desk.  You need to re-schedule your appointment should you arrive 10 or more minutes late.  We strive to give you quality time with our providers, and arriving late affects you and other patients whose appointments are after yours.  Also, if you no show three or more times for appointments you may be dismissed from the clinic at the providers discretion.     Again, thank you for choosing [facility].  Our hope is that these requests will decrease the amount of time that you wait before being seen by our physicians.       _____________________________________________________________  Should you have questions after your visit to [facility], please contact our office at [phone] and follow the prompts.  Our office hours are 8:00 a.m. and 4:30 p.m. Monday - Friday.  Please note that voicemails left after 4:00 p.m. may not be returned until the following business day.  We are closed weekends and major holidays.  You do have access to a nurse 24-7, just call the main number to the clinic [phone] and do not press any options, hold on the line and a nurse will answer the phone.    For prescription refill requests,  have your pharmacy contact our office and allow 72 hours.    Due to Covid, you will need to wear a mask upon entering the hospital. If you do not have a mask, a mask will be given to you at the Main Entrance upon arrival. For doctor visits, patients may have 1 support person age 79 or older with them. For treatment visits, patients can not have anyone with them due to social distancing guidelines and our immunocompromised population.

## 2020-01-08 NOTE — Progress Notes (Signed)
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  NGS Results: Not done  CURRENT THERAPY: Surveillance  BRIEF ONCOLOGIC HISTORY:  Oncology History  Malignant neoplasm of prostate ([place])  04/23/2014 Procedure   [place] and prostate biopsy   04/23/2014 Pathology Results   2/12 cores positive for adenocarcinoma.  Gleason 3+3 = 6 pattern.   08/14/2014 Initial Diagnosis   Malignant neoplasm of prostate [facility])    - 09/20/2014 Radiation Therapy   Brachytherapy with I-125   Squamous cell carcinoma of esophagus ([place])  04/14/2016 Procedure   EGD by Dr. Laural Golden.   04/16/2016 Pathology Results   Esophagus, biopsy - INVASIVE POORLY DIFFERENTIATED SQUAMOUS CELL CARCINOMA.   04/26/2016 Imaging   CT CAP- 1. Prominent gastroesophageal junction without discrete esophageal mass. Notably, there is also a borderline enlarged enhancing lymph node in the gastrohepatic ligament immediately adjacent to the gastroesophageal junction, which is nonspecific, but could indicate early nodal disease. 2. The small cluster of low-attenuation lesions in segment 7 of the liver are too small to characterize. Statistically, these are likely to represent cysts. If there is clinical concern for metastatic disease to the liver, these could be further characterized with MRI of the abdomen with and without IV gadolinium at this time. 3. No other potential sites of metastatic disease noted elsewhere in the chest, abdomen or pelvis.   05/10/2016 PET scan   Hypermetabolic bilateral lower thoracic esophageal mass, consistent with known primary esophageal carcinoma.  Small hypermetabolic lymph  node in the upper gastrohepatic ligament adjacent to GE junction, consistent with lymph node metastasis.  No other sites of metastatic disease identified.   05/31/2016 Procedure   Port placed by Dr. Rosana Hoes   06/02/2016 - 07/14/2016 Chemotherapy   Weekly Carboplatin/Paclitaxel with XRT.  Tolerated extremely well.    06/02/2016 - 07/14/2016 Radiation Therapy   [place], [place].  Tolerated well.   08/23/2016 PET scan   1. Complete metabolic response. No residual hypermetabolism in the thoracic esophagus or in the upper gastrohepatic ligament lymph node, which is decreased in size. 2. Aortic atherosclerosis. Three-vessel coronary atherosclerosis.   08/24/2016 Miscellaneous   CTS consult with Dr. Servando Snare- "I've recommended to him that surgical resection offers the best chance of cure, if he waits and there is recurrent disease remaining no longer be a surgical candidate. From his discussion today I think he is not inclined to agree but it has been offered."   03/09/2017 Imaging   CT CAP: 1. Mild distal esophageal and GE junction thickening, which appears slightly more prominent compared with PET-CT 08/20/2016. Correlation with PET-CT suggested. 2. Negative for pulmonary nodules or metastatic disease to the chest. 3. Stable subcentimeter posterior right hepatic lobe hypodensities. No definite evidence for metastatic disease to the abdomen or pelvis   04/28/2017 PET scan   No evidence of recurrent or metastatic carcinoma.     CANCER STAGING: Cancer Staging Squamous cell carcinoma of esophagus ([place]) Staging form: Esophagus - Squamous Cell Carcinoma, AJCC 8th Edition - Clinical stage from 06/15/2016: Stage Unknown (cTX, cN1, cM0, L: Lower) - Signed by Baird Cancer, PA-C on 06/15/2016   INTERVAL HISTORY:  Stanley Jimenez, a 64 y.o. male, returns for routine  follow-up of his squamous cell carcinoma of esophagus. Stanley Jimenez was last seen on 06/01/2018.  Today he reports feeling well and he  denies pain or difficulty swallowing; food is not getting stuck in his esophagus. He denies having any numbness or tingling. He reports that he gets calf claudication when he walks and is followed by Dr. Buford Dresser in vascular.  He reports that he quit smoking for a while, but has picked up smoking again; the Chantix worked well when he was prescribed it.   REVIEW OF SYSTEMS:  Review of Systems  Constitutional: Negative for appetite change and fatigue.  HENT:   Negative for trouble swallowing.   Musculoskeletal: Positive for arthralgias (6/10 bilat leg pain) and myalgias (claudication d/t PVD).  Neurological: Positive for dizziness. Negative for numbness.  All other systems reviewed and are negative.   PAST MEDICAL/SURGICAL HISTORY:  Past Medical History:  Diagnosis Date  . 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 Anxiety   . Arthritis   . Cirrhosis, alcoholic ([place])   . Clubbing of fingers    congenital  . Depression   . Full dentures   . History of PSVT (paroxysmal supraventricular tachycardia)    run of non-sustatined VT 03-10-2015 in setting of alcohol withdrawal in hospital  . History of seizure    03-08-2012  alcohol withdrawal  . History of thrombocytopenia    10/ 2013  in setting of alcohol withdrawal  . Hyperlipidemia   . Hypertension   . Prostate cancer [facility]) urologist-  dr dalhstedt/  oncologist- dr Tammi Klippel   T1c, Gleason 3+3,  PSA 8.89,  vol 27cc  . 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 Squamous cell carcinoma of esophagus ([place]) 04/21/2016   Past Surgical History:  Procedure Laterality Date  . BIOPSY  04/14/2016   Procedure: BIOPSY;  Surgeon: Rogene Houston, MD;  [facility];  Service: Endoscopy;;  esophagus  . COLONOSCOPY N/A 04/14/2016   Procedure: COLONOSCOPY;  Surgeon: Rogene Houston, MD;  Location[facility];  Service: Endoscopy;   Laterality: N/A;  . ESOPHAGOGASTRODUODENOSCOPY N/A 04/14/2016   Procedure: ESOPHAGOGASTRODUODENOSCOPY (EGD);  Surgeon: Rogene Houston, MD;  Location[facility];  Service: Endoscopy;  Laterality: N/A;  1:00  . NO PAST SURGERIES    . PORTACATH PLACEMENT Right 05/31/2016   Procedure: INSERTION OF TUNNELED RIGHT INTERNAL JUGULAR BARD POWERPORT CENTRAL VENOUS CATHETER WITH SUBCUTANEOUS PORT;  Surgeon: Vickie Epley, MD;  Location: [facility] ORS;  Service: Vascular;  Laterality: Right;  . RADIOACTIVE SEED IMPLANT N/A 09/20/2014   Procedure: RADIOACTIVE SEED IMPLANT;  Surgeon: Franchot Gallo, MD;  [facility];  Service: Urology;  Laterality: N/A;    77   seeds implanted no seeds found in bladder  . TRANSURETHRAL RESECTION OF PROSTATE      SOCIAL HISTORY:  Social History   Socioeconomic History  . Marital status: Single    Spouse name: Not on file  . Number of children: Not on file  . Years of education: Not on file  . Highest education level: Not on file  Occupational History  . Not on file  Tobacco Use  . Smoking status: Current Some Day Smoker    Packs/day: 0.50    Years: 20.00    Pack years: 10.00    Types: Cigarettes  . Smokeless tobacco: Former Systems developer    Types:  Sarina Ser    Quit date: 05/27/1984  . Tobacco comment: only smoking a few  Vaping Use  . Vaping Use: Never used  Substance and Sexual Activity  . Alcohol use: Not on file    Comment: quit drinking in October 2015. He says before this he says he stayed drunk every day.   . Drug use: No    Comment: last use october 2015  . Sexual activity: Never    Birth control/protection: None  Other Topics Concern  . Not on file  Social History Narrative  . Not on file   Social Determinants of Health   Financial Resource Strain:   . Difficulty of Paying Living Expenses:   Food Insecurity:   . Worried About Charity fundraiser in the Last Year:   . Arboriculturist in the Last Year:   Transportation Needs:    . Film/video editor (Medical):   Marland Kitchen Lack of Transportation (Non-Medical):   Physical Activity:   . Days of Exercise per Week:   . Minutes of Exercise per Session:   Stress:   . Feeling of Stress :   Social Connections:   . Frequency of Communication with Friends and Family:   . Frequency of Social Gatherings with Friends and Family:   . Attends Religious Services:   . Active Member of Clubs or Organizations:   . Attends Archivist Meetings:   Marland Kitchen Marital Status:   Intimate Partner Violence:   . Fear of Current or Ex-Partner:   . Emotionally Abused:   Marland Kitchen Physically Abused:   . Sexually Abused:     FAMILY HISTORY:  Family History  Problem Relation Age of Onset  . Alcohol abuse Mother   . Diabetes Sister     CURRENT MEDICATIONS:  Current Outpatient Medications  Medication Sig Dispense Refill  . amLODipine (NORVASC) 10 MG tablet TAKE 1 TABLET BY MOUTH DAILY 90 tablet 1  . aspirin EC 81 MG tablet Take 1 tablet (81 mg total) by mouth daily. 90 tablet 3  . rosuvastatin (CRESTOR) 20 MG tablet Take 1 tablet (20 mg total) by mouth daily. 30 tablet 3  . tamsulosin (FLOMAX) 0.4 MG CAPS capsule Take 1 capsule (0.4 mg total) by mouth daily. 90 capsule 1  . triamcinolone cream (KENALOG) 0.1 % APPLY TO THE AFFECTED AREA TWICE DAILY 454 g 0   No current facility-administered medications for this visit.    ALLERGIES:  No Known Allergies  PHYSICAL EXAM:  Performance status (ECOG): 1 - Symptomatic but completely ambulatory  Vitals:   01/08/20 1050  BP: 131/81  Pulse: 89  Resp: 18  Temp: 98.6 F (37 C)  SpO2: 100%   Wt Readings from Last 3 Encounters:  01/08/20 167 lb 9.6 oz (76 kg)  11/06/19 168 lb (76.2 kg)  10/02/19 169 lb 6.4 oz (76.8 kg)   Physical Exam Vitals reviewed.  Constitutional:      Appearance: Normal appearance.  Cardiovascular:     Rate and Rhythm: Normal rate and regular rhythm.     Pulses: Normal pulses.     Heart sounds: Normal heart  sounds.  Pulmonary:     Effort: Pulmonary effort is normal.     Breath sounds: Normal breath sounds.  Abdominal:     Palpations: Abdomen is soft. There is no hepatomegaly, splenomegaly or mass.     Tenderness: There is no abdominal tenderness.     Hernia: No hernia is present.  Musculoskeletal:  Right lower leg: No edema.     Left lower leg: No edema.  Neurological:     General: No focal deficit present.     Mental Status: He is alert and oriented to person, place, and time.  Psychiatric:        Mood and Affect: Mood normal.        Behavior: Behavior normal.      LABORATORY DATA:  I have reviewed the labs as listed.  CBC Latest Ref Rng & Units 01/01/2020 07/03/2019 04/09/2019  WBC 4.0 - 10.5 K/uL 8.0 8.8 6.9  Hemoglobin 13.0 - 17.0 g/dL 12.4(L) 13.5 13.0(L)  Hematocrit 39 - 52 % 38.7(L) 41.3 39.4  Platelets 150 - 400 K/uL 208 185 215   CMP Latest Ref Rng & Units 01/01/2020 07/03/2019 04/09/2019  Glucose 70 - 99 mg/dL 102(H) 109(H) 98  BUN 8 - 23 mg/dL 15 23 16   Creatinine 0.61 - 1.24 mg/dL 1.08 1.11 1.22  Sodium 135 - 145 mmol/L 138 139 139  Potassium 3.5 - 5.1 mmol/L 3.9 4.4 4.2  Chloride 98 - 111 mmol/L 108 105 104  CO2 22 - 32 mmol/L 19(L) 26 29  Calcium 8.9 - 10.3 mg/dL 8.6(L) 9.1 9.4  Total Protein 6.5 - 8.1 g/dL 7.2 7.7 7.2  Total Bilirubin 0.3 - 1.2 mg/dL 0.6 0.8 0.7  Alkaline Phos 38 - 126 U/L 76 84 -  AST 15 - 41 U/L 24 19 21   ALT 0 - 44 U/L 37 32 34   Lab Results  Component Value Date   LDH 165 01/01/2020   LDH 165 07/03/2019   LDH 158 12/20/2018   Lab Results  Component Value Date   VD25OH 34.98 01/01/2020   VD25OH 26 (L) 03/01/2018    DIAGNOSTIC IMAGING:  I have independently reviewed the scans and discussed with the patient. VAS [place] LE ART SEG MULTI (Segm&LE Reynauds)  Result Date: 01/02/2020 LOWER EXTREMITY DOPPLER STUDY Indications: Claudication, peripheral artery disease, and Patient states he will              intermittently get pains in both  lower calves after exerting              himself. This has been happening for about 2 years. He denies rest              pain. High Risk Factors: Hypertension, current smoker. Other Factors: SEE BILATERAL LEG ARTERIAL DUPLEX REPORT.  Comparison Study: None Performing Technologist: Salvadore Dom RVT  Examination Guidelines: A complete evaluation includes at minimum, Doppler waveform signals and systolic blood pressure reading at the level of bilateral brachial, anterior tibial, and posterior tibial arteries, when vessel segments are accessible. Bilateral testing is considered an integral part of a complete examination. Photoelectric Plethysmograph (PPG) waveforms and toe systolic pressure readings are included as required and additional duplex testing as needed. Limited examinations for reoccurring indications may be performed as noted.  ABI Findings: +---------+------------------+-----+---------+--------+ Right    Rt Pressure (mmHg)IndexWaveform Comment  +---------+------------------+-----+---------+--------+ Brachial 129                                      +---------+------------------+-----+---------+--------+ CFA                             triphasic         +---------+------------------+-----+---------+--------+ Popliteal  triphasic         +---------+------------------+-----+---------+--------+ ATA      82                0.64 triphasic         +---------+------------------+-----+---------+--------+ PTA      113               0.88 biphasic          +---------+------------------+-----+---------+--------+ PERO     94                0.73 biphasic          +---------+------------------+-----+---------+--------+ Great Toe96                0.74 Abnormal          +---------+------------------+-----+---------+--------+ +---------+------------------+-----+-----------+-------+ Left     Lt Pressure (mmHg)IndexWaveform   Comment  +---------+------------------+-----+-----------+-------+ Brachial 121                                       +---------+------------------+-----+-----------+-------+ CFA                             triphasic          +---------+------------------+-----+-----------+-------+ Popliteal                       monophasic         +---------+------------------+-----+-----------+-------+ ATA      91                0.71 multiphasic        +---------+------------------+-----+-----------+-------+ PTA      116               0.90 monophasic         +---------+------------------+-----+-----------+-------+ PERO     79                0.61 biphasic           +---------+------------------+-----+-----------+-------+ Great Toe74                0.57 Abnormal           +---------+------------------+-----+-----------+-------+ +-------+-----------+-----------+------------+------------+ ABI/TBIToday's ABIToday's TBIPrevious ABIPrevious TBI +-------+-----------+-----------+------------+------------+ Right  .88        .74                                 +-------+-----------+-----------+------------+------------+ Left   .90        .57                                 +-------+-----------+-----------+------------+------------+   Summary: Right: Resting right ankle-brachial index indicates mild right lower extremity arterial disease. The right toe-brachial index is normal. Left: Resting left ankle-brachial index indicates mild left lower extremity arterial disease. The left toe-brachial index is abnormal.  *See table(s) above for measurements and observations.  Electronically signed by Larae Grooms MD on 01/02/2020 at 5:12:13 PM.    Final    VAS [place] LOWER EXTREMITY ARTERIAL DUPLEX  Result Date: 01/02/2020 LOWER EXTREMITY ARTERIAL DUPLEX STUDY Indications: Claudication, peripheral artery disease, and Patient states he will              intermittently get pains in both lower  calves  after exerting              himself. This has been happening for about 2 years. He denies rest              pain. High Risk Factors: Hypertension, current smoker. Other Factors: SEE ABI REPORT.  Current ABI: Right .88 Left .64 Comparison Study: None Performing Technologist: Alecia Mackin RVT, RDCS (AE), RDMS  Examination Guidelines: A complete evaluation includes B-mode imaging, spectral Doppler, color Doppler, and power Doppler as needed of all accessible portions of each vessel. Bilateral testing is considered an integral part of a complete examination. Limited examinations for reoccurring indications may be performed as noted. Aorta: +------+-------+----------+----------+--------+--------+-----+       AP (cm)Trans (cm)PSV (cm/s)WaveformThrombusShape +------+-------+----------+----------+--------+--------+-----+ Distal2.10             66        biphasic              +------+-------+----------+----------+--------+--------+-----+ Atherosclerosis seen throughout aorta.  +-----------+--------+-----+---------------+----------------+--------------+ RIGHT      PSV cm/sRatioStenosis       Waveform        Comments       +-----------+--------+-----+---------------+----------------+--------------+ CIA Prox   59                          biphasic                       +-----------+--------+-----+---------------+----------------+--------------+ CIA Mid    141                         biphasic                       +-----------+--------+-----+---------------+----------------+--------------+ CIA Distal 221                         biphasic        >50 % stenosis +-----------+--------+-----+---------------+----------------+--------------+ EIA Prox   185                         biphasic                       +-----------+--------+-----+---------------+----------------+--------------+ EIA Mid    211                         biphasic        >50 % stenosis  +-----------+--------+-----+---------------+----------------+--------------+ EIA Distal 169                         biphasic                       +-----------+--------+-----+---------------+----------------+--------------+ CFA Prox   136                         barely triphasic               +-----------+--------+-----+---------------+----------------+--------------+ CFA Distal 111                         triphasic                      +-----------+--------+-----+---------------+----------------+--------------+ DFA  178                         triphasic                      +-----------+--------+-----+---------------+----------------+--------------+ SFA Prox   197          30-49% stenosistriphasic                      +-----------+--------+-----+---------------+----------------+--------------+ SFA Mid    149                         triphasic                      +-----------+--------+-----+---------------+----------------+--------------+ SFA Distal 163                         triphasic                      +-----------+--------+-----+---------------+----------------+--------------+ POP Prox   54                          triphasic                      +-----------+--------+-----+---------------+----------------+--------------+ POP Distal 43                          triphasic                      +-----------+--------+-----+---------------+----------------+--------------+ TP Trunk   55                          triphasic                      +-----------+--------+-----+---------------+----------------+--------------+ ATA Prox   101                         triphasic                      +-----------+--------+-----+---------------+----------------+--------------+ ATA Mid    41                          triphasic                      +-----------+--------+-----+---------------+----------------+--------------+ ATA Distal 44                           biphasic                       +-----------+--------+-----+---------------+----------------+--------------+ PTA Prox   40                          biphasic                       +-----------+--------+-----+---------------+----------------+--------------+ PTA Mid    28                          biphasic                       +-----------+--------+-----+---------------+----------------+--------------+  PTA Distal 36                          biphasic                       +-----------+--------+-----+---------------+----------------+--------------+ PERO Prox  24                          biphasic                       +-----------+--------+-----+---------------+----------------+--------------+ PERO Mid   45                          biphasic                       +-----------+--------+-----+---------------+----------------+--------------+ PERO Distal24                          monophasic                     +-----------+--------+-----+---------------+----------------+--------------+   +-----------+--------+-----+---------------+----------+------------------------+ LEFT       PSV cm/sRatioStenosis       Waveform  Comments                 +-----------+--------+-----+---------------+----------+------------------------+ CIA Prox   72                          biphasic                           +-----------+--------+-----+---------------+----------+------------------------+ CIA Mid    121                         biphasic                           +-----------+--------+-----+---------------+----------+------------------------+ CIA Distal 135                         biphasic                           +-----------+--------+-----+---------------+----------+------------------------+ EIA Prox   116                         biphasic                           +-----------+--------+-----+---------------+----------+------------------------+  EIA Mid    162                         biphasic                           +-----------+--------+-----+---------------+----------+------------------------+ EIA Distal 158                         biphasic                           +-----------+--------+-----+---------------+----------+------------------------+ CFA Prox   194  30-49% stenosistriphasic                          +-----------+--------+-----+---------------+----------+------------------------+ CFA Distal 115                         triphasic                          +-----------+--------+-----+---------------+----------+------------------------+ DFA        133                         triphasic                          +-----------+--------+-----+---------------+----------+------------------------+ SFA Prox   100                         biphasic                           +-----------+--------+-----+---------------+----------+------------------------+ SFA Mid    81           occluded                 short segment occlusion                                                   with recannulization     +-----------+--------+-----+---------------+----------+------------------------+ SFA Distal 63                          biphasic                           +-----------+--------+-----+---------------+----------+------------------------+ POP Prox   61                          monophasic                         +-----------+--------+-----+---------------+----------+------------------------+ POP Distal 26                          monophasic                         +-----------+--------+-----+---------------+----------+------------------------+ TP Trunk   36                          monophasic                         +-----------+--------+-----+---------------+----------+------------------------+ ATA Prox   49                          monophasic                          +-----------+--------+-----+---------------+----------+------------------------+ ATA Mid    23  monophasic                         +-----------+--------+-----+---------------+----------+------------------------+ ATA Distal 34                          monophasic                         +-----------+--------+-----+---------------+----------+------------------------+ PTA Prox   21                          monophasic                         +-----------+--------+-----+---------------+----------+------------------------+ PTA Mid    29                          monophasic                         +-----------+--------+-----+---------------+----------+------------------------+ PTA Distal 12                          monophasic                         +-----------+--------+-----+---------------+----------+------------------------+ PERO Prox  23                          monophasic                         +-----------+--------+-----+---------------+----------+------------------------+ PERO Mid   21                          monophasic                         +-----------+--------+-----+---------------+----------+------------------------+ PERO Distal9                           monophasic                         +-----------+--------+-----+---------------+----------+------------------------+  Summary: Right: 30-49% stenosis noted in the superficial femoral artery. > 50 % stenosis distal CIA and mid EIA. Multiple areas of acoustic shadowing with irregular calcified plaque in lower extremity, can not rule out high grade stenosis versus occlusion within. Left: 30-49% stenosis noted in the common femoral artery. Total occlusion noted in the superficial femoral artery. Multiple areas of acoustic shadowing with irregular calcified plaque in lower extremity, can not rule out high grade stenosis versus occlusion within.  See table(s) above for measurements and  observations. Patient scheduled to see Dr. Fletcher Anon on 01/15/2020 at 10:00 am. Vascular consult recommended. Electronically signed by Larae Grooms MD on 01/02/2020 at 5:20:13 PM.    Final      ASSESSMENT:  1.  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  Stable small low-density lesions in the right hepatic lobe benign.  2.  Smoking cessation: -He  used Chantix and quit smoking in 2020.  He started back again.   PLAN:  1.  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  He is not losing weight. -LFTs were normal.  CBC shows slightly decreased hemoglobin.  B12 was normal. -I plan to see him back in 6 months for follow-up.  I plan to repeat CBC, ferritin, iron panel and a CT scan prior to next visit.  2.  Smoking cessation: -I have counseled him about smoking cessation especially in the setting of peripheral vascular disease. -We will send a prescription for Chantix starter pack and maintenance pack.    Orders placed this encounter:  No orders of the defined types were placed in this encounter.    Derek Jack, MD [place] [phone]   I, Milinda Antis, am acting as a scribe for Dr. Sanda Linger.  I, Derek Jack MD, have reviewed the above documentation for accuracy and completeness, and I agree with the above.

## 2020-01-15 ENCOUNTER — Other Ambulatory Visit: Payer: Self-pay

## 2020-01-15 ENCOUNTER — Ambulatory Visit: Payer: Medicaid Other | Admitting: Cardiovascular Disease

## 2020-01-15 ENCOUNTER — Encounter: Payer: Self-pay | Admitting: Cardiovascular Disease

## 2020-01-15 VITALS — BP 110/64 | HR 82 | Ht 70.5 in | Wt 166.4 lb

## 2020-01-15 DIAGNOSIS — Z720 Tobacco use: Secondary | ICD-10-CM | POA: Diagnosis not present

## 2020-01-15 DIAGNOSIS — E785 Hyperlipidemia, unspecified: Secondary | ICD-10-CM | POA: Diagnosis not present

## 2020-01-15 DIAGNOSIS — I739 Peripheral vascular disease, unspecified: Secondary | ICD-10-CM

## 2020-01-15 LAB — HEPATIC FUNCTION PANEL
ALT: 41 IU/L (ref 0–44)
AST: 20 IU/L (ref 0–40)
Albumin: 4.4 g/dL (ref 3.8–4.8)
Alkaline Phosphatase: 87 IU/L (ref 48–121)
Bilirubin Total: 0.5 mg/dL (ref 0.0–1.2)
Bilirubin, Direct: 0.19 mg/dL (ref 0.00–0.40)
Total Protein: 6.6 g/dL (ref 6.0–8.5)

## 2020-01-15 LAB — LIPID PANEL
Chol/HDL Ratio: 2.3 ratio (ref 0.0–5.0)
Cholesterol, Total: 108 mg/dL (ref 100–199)
HDL: 47 mg/dL (ref >39)
LDL Chol Calc (NIH): 46 mg/dL (ref 0–99)
Triglycerides: 68 mg/dL (ref 0–149)
VLDL Cholesterol Cal: 15 mg/dL (ref 5–40)

## 2020-01-15 MED ORDER — CHANTIX STARTING MONTH PAK 0.5 MG X 11 & 1 MG X 42 PO TABS: ORAL_TABLET | ORAL | 0 refills | Status: DC

## 2020-01-15 MED ORDER — VARENICLINE TARTRATE 1 MG PO TABS: 1.0000 mg | ORAL_TABLET | Freq: Two times a day (BID) | ORAL | 0 refills | Status: DC

## 2020-01-15 NOTE — Progress Notes (Signed)
Cardiology Office Note   Date:  01/15/2020   ID:  Stanley, Jimenez [birth year], MRN [id number]  PCP:  [name], MD  Cardiologist:  Dr. Johnsie Cancel  No chief complaint on file.     History of Present Illness: Stanley Jimenez is a 64 y.o. male who is here today for follow-up visit regarding peripheral arterial disease.  He has known history of essential hypertension, tobacco use and coronary/aortic atherosclerosis noted on previous CT. Previous CT scan showed evidence of three-vessel coronary artery vascularization as well as aortic atherosclerosis.  He has history of previous heavy alcohol use.  He has history of esophageal cancer status post radiation and chemotherapy.  He reports that he quit alcohol drinking about 6 years ago.  He was told at some point that he had liver cirrhosis but he has not had any issues lately with this.   He was seen recently for bilateral calf claudication.  He underwent noninvasive vascular studies which showed an ABI of 0.88 on the right and 0.90 on the left.  Duplex showed occluded left SFA.  He reports that his claudication is overall mild and he was able to do his yard work yesterday with no limitations.  He also walks his dog daily.  He does not feel that one leg is worse than the other.   range.   Past Medical History:  Diagnosis Date  . 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 Anxiety   . Arthritis   . Cirrhosis, alcoholic ([place])   . Clubbing of fingers    congenital  . Depression   . Full dentures   . History of PSVT (paroxysmal supraventricular tachycardia)    run of non-sustatined VT 03-10-2015 in setting of alcohol withdrawal in hospital  . History of seizure    03-08-2012  alcohol withdrawal  . History of thrombocytopenia    10/ 2013  in setting of alcohol withdrawal  . Hyperlipidemia   . Hypertension   . Prostate cancer [facility]) urologist-  dr dalhstedt/  oncologist- dr Tammi Klippel   T1c, Gleason 3+3,  PSA 8.89,  vol  27cc  . 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 Squamous cell carcinoma of esophagus ([place]) 04/21/2016    Past Surgical History:  Procedure Laterality Date  . BIOPSY  04/14/2016   Procedure: BIOPSY;  Surgeon: Rogene Houston, MD;  [facility];  Service: Endoscopy;;  esophagus  . COLONOSCOPY N/A 04/14/2016   Procedure: COLONOSCOPY;  Surgeon: Rogene Houston, MD;  [facility];  Service: Endoscopy;  Laterality: N/A;  . ESOPHAGOGASTRODUODENOSCOPY N/A 04/14/2016   Procedure: ESOPHAGOGASTRODUODENOSCOPY (EGD);  Surgeon: Rogene Houston, MD;  Location: [facility];  Service: Endoscopy;  Laterality: N/A;  1:00  . NO PAST SURGERIES    . PORTACATH PLACEMENT Right 05/31/2016   Procedure: INSERTION OF TUNNELED RIGHT INTERNAL JUGULAR BARD POWERPORT CENTRAL VENOUS CATHETER WITH SUBCUTANEOUS PORT;  Surgeon: Vickie Epley, MD;  Location: [facility];  Service: Vascular;  Laterality: Right;  . RADIOACTIVE SEED IMPLANT N/A 09/20/2014   Procedure: RADIOACTIVE SEED IMPLANT;  Surgeon: Franchot Gallo, MD;  Location: [facility];  Service: Urology;  Laterality: N/A;    77   seeds implanted no seeds found in bladder  . TRANSURETHRAL RESECTION OF PROSTATE       Current Outpatient Medications  Medication Sig Dispense  Refill  . amLODipine (NORVASC) 10 MG tablet TAKE 1 TABLET BY MOUTH DAILY 90 tablet 1  . aspirin EC 81 MG tablet Take 1 tablet (81 mg total) by mouth daily. 90 tablet 3  . rosuvastatin (CRESTOR) 20 MG tablet Take 1 tablet (20 mg total) by mouth daily. 30 tablet 3  . tamsulosin (FLOMAX) 0.4 MG CAPS capsule Take 1 capsule (0.4 mg total) by mouth daily. 90 capsule 1  . triamcinolone cream (KENALOG) 0.1 % APPLY TO THE AFFECTED AREA TWICE DAILY 454 g 0  . varenicline (CHANTIX CONTINUING MONTH PAK) 1 MG tablet Take 1 tablet (1 mg total) by mouth 2 (two) times daily. (Patient not taking:  Reported on 01/15/2020) 60 tablet 0  . varenicline (CHANTIX STARTING MONTH PAK) 0.5 MG X 11 & 1 MG X 42 tablet Take one 0.5 mg tablet by mouth once daily for 3 days, then increase to one 0.5 mg tablet twice daily for 4 days, then increase to one 1 mg tablet twice daily. (Patient not taking: Reported on 01/15/2020) 53 tablet 0   No current facility-administered medications for this visit.    Allergies:   Patient has no known allergies.    Social History:  The patient  reports that he has been smoking cigarettes. He has a 10.00 pack-year smoking history. He quit smokeless tobacco use about 35 years ago.  His smokeless tobacco use included chew. He reports that he does not use drugs.   Family History:  The patient's family history includes Alcohol abuse in his mother; Diabetes in his sister.    ROS:  Please see the history of present illness.   Otherwise, review of systems are positive for none.   All other systems are reviewed and negative.    PHYSICAL EXAM: VS:  BP 110/64   Pulse 82   Ht 5' 10.5" (1.791 m)   Wt 166 lb 6.4 oz (75.5 kg)   BMI 23.54 kg/m  , BMI Body mass index is 23.54 kg/m. GEN: Well nourished, well developed, in no acute distress  HEENT: normal  Neck: no JVD, carotid bruits, or masses Cardiac: RRR; no murmurs, rubs, or gallops,no edema  Respiratory:  clear to auscultation bilaterally, normal work of breathing GI: soft, nontender, nondistended, + BS MS: no deformity or atrophy  Skin: warm and dry, no rash Neuro:  Strength and sensation are intact Psych: euthymic mood, full affect Vascular: Femoral pulses +2 bilaterally.  Distal pulses are not palpable.   EKG:  EKG is not ordered today.    Recent Labs: 01/01/2020: ALT 37; BUN 15; Creatinine, Ser 1.08; Hemoglobin 12.4; Platelets 208; Potassium 3.9; Sodium 138; TSH 0.586    Lipid Panel    Component Value Date/Time   CHOL 179 04/09/2019 0839   TRIG 93 04/09/2019 0839   HDL 52 04/09/2019 0839   CHOLHDL 3.4  04/09/2019 0839   VLDL 12 10/20/2016 0917   LDLCALC 108 (H) 04/09/2019 0839      Wt Readings from Last 3 Encounters:  01/15/20 166 lb 6.4 oz (75.5 kg)  01/08/20 167 lb 9.6 oz (76 kg)  11/06/19 168 lb (76.2 kg)        PAD Screen 11/18/2016  Previous PAD dx? No  Previous surgical procedure? No  Pain with walking? Yes  Subsides with rest? Yes  Feet/toe relief with dangling? No  Painful, non-healing ulcers? No  Extremities discolored? No      ASSESSMENT AND PLAN:  1.  Peripheral arterial disease: He now reports that  his calf claudication is overall mild and does not seem to be lifestyle limiting.  Vascular studies showed occluded left SFA but he reports that both legs are equal in terms of pain.  Given that his symptoms are not lifestyle limiting, I recommend continuing medical therapy.  Continue low-dose aspirin.    2.  Hyperlipidemia: I started the patient on rosuvastatin 20 mg once daily during last visit.  Check lipid and liver profile today.   3.  Tobacco use: I again discussed the importance of smoking cessation.  He was prescribed Chantix but has not been able to get the prescription.  I represcribed this again.   Disposition:   FU with me in 6 months  Signed,  Kathlyn Sacramento, MD  01/15/2020 9:42 AM    [place]

## 2020-01-15 NOTE — Patient Instructions (Signed)
Medication Instructions:  No changes *If you need a refill on your cardiac medications before your next appointment, please call your pharmacy*   Lab Work: Your provider would like for you to have the following labs today: fasting Lipid and liver If you have labs (blood work) drawn today and your tests are completely normal, you will receive your results only by: Marland Kitchen MyChart Message (if you have MyChart) OR . A paper copy in the mail If you have any lab test that is abnormal or we need to change your treatment, we will call you to review the results.   Testing/Procedures: None ordered   Follow-Up: At [facility], you and your health needs are our priority.  As part of our continuing mission to provide you with exceptional heart care, we have created designated Provider Care Teams.  These Care Teams include your primary Cardiologist (physician) and Advanced Practice Providers (APPs -  Physician Assistants and Nurse Practitioners) who all work together to provide you with the care you need, when you need it.  We recommend signing up for the patient portal called "MyChart".  Sign up information is provided on this After Visit Summary.  MyChart is used to connect with patients for Virtual Visits (Telemedicine).  Patients are able to view lab/test results, encounter notes, upcoming appointments, etc.  Non-urgent messages can be sent to your provider as well.   To learn more about what you can do with MyChart, go to [email or web address].    Your next appointment:   6 month(s)  The format for your next appointment:   In Person  Provider:   Kathlyn Sacramento, MD

## 2020-01-16 ENCOUNTER — Encounter: Payer: Self-pay | Admitting: *Deleted

## 2020-01-31 ENCOUNTER — Other Ambulatory Visit: Payer: Self-pay | Admitting: Family Medicine

## 2020-02-25 ENCOUNTER — Telehealth: Payer: Self-pay

## 2020-02-25 ENCOUNTER — Ambulatory Visit (INDEPENDENT_AMBULATORY_CARE_PROVIDER_SITE_OTHER): Payer: Medicaid Other | Admitting: Family Medicine

## 2020-02-25 ENCOUNTER — Other Ambulatory Visit: Payer: Self-pay

## 2020-02-25 ENCOUNTER — Encounter: Payer: Self-pay | Admitting: Family Medicine

## 2020-02-25 VITALS — BP 120/68 | HR 84 | Resp 16 | Ht 70.5 in | Wt 163.1 lb

## 2020-02-25 DIAGNOSIS — Z23 Encounter for immunization: Secondary | ICD-10-CM | POA: Diagnosis not present

## 2020-02-25 DIAGNOSIS — F17210 Nicotine dependence, cigarettes, uncomplicated: Secondary | ICD-10-CM | POA: Diagnosis not present

## 2020-02-25 DIAGNOSIS — C61 Malignant neoplasm of prostate: Secondary | ICD-10-CM | POA: Diagnosis not present

## 2020-02-25 DIAGNOSIS — Z0000 Encounter for general adult medical examination without abnormal findings: Secondary | ICD-10-CM | POA: Diagnosis not present

## 2020-02-25 MED ORDER — BUPROPION HCL ER (XL) 150 MG PO TB24: 150.0000 mg | ORAL_TABLET | Freq: Every day | ORAL | 2 refills | Status: DC

## 2020-02-25 NOTE — Patient Instructions (Addendum)
F/u  re nicotinE depenedenc and chronic problems in February ,call if you need me [place]  Nurse to track and document covid vaccines please  PSA TODAY  fLU VACCINE TODAY  Quit Date IS 03/10/2020. sTART WELLBUTRIN 150 MG ONE DAILY TODAY TO HELP YOU TO QUIT on 09/09/2019  It is important that you exercise regularly at least 30 minutes 5 times a week. If you develop chest pain, have severe difficulty breathing, or feel very tired, stop exercising immediately and seek medical attention  Think about what you will eat, plan ahead. Choose " clean, green, fresh or frozen" over canned, processed or packaged foods which are more sugary, salty and fatty. 70 to 75% of food eaten should be vegetables and fruit. Three meals at set times with snacks allowed between meals, but they must be fruit or vegetables. Aim to eat over a 12 hour period , example 7 am to 7 pm, and STOP after  your last meal of the day. Drink water,generally about 64 ounces per day, no other drink is as healthy. Fruit juice is best enjoyed in a healthy way, by EATING the fruit. Thanks for choosing [facility], we consider it a privelige to serve you.

## 2020-02-25 NOTE — Telephone Encounter (Signed)
Pt had covid shots at [facility] on [address]

## 2020-02-25 NOTE — Progress Notes (Signed)
   Stanley Jimenez     MRN: [id number]      DOB: 10-20-1955   HPI: Patient is in for annual physical exam. Wants to quit smoking, smokes 2 to 4 / day Recent labs, if available are reviewed. Immunization is reviewed , and  updated if needed.    PE; BP 120/68   Pulse 84   Resp 16   Ht 5' 10.5" (1.791 m)   Wt 163 lb 1.9 oz (74 kg)   SpO2 97%   BMI 23.07 kg/m    Pleasant male, alert and oriented x 3, in no cardio-pulmonary distress. Afebrile. HEENT No facial trauma or asymetry. Sinuses non tender. EOMI External ears normal,  Neck: supple, no adenopathy,JVD or thyromegaly.No bruits.  Chest: Clear to ascultation bilaterally.No crackles or wheezes. Non tender to palpation  Cardiovascular system; Heart sounds normal,  S1 and  S2 ,no S3.  No murmur, or thrill. Apical beat not displaced Peripheral pulses normal.  Abdomen: Soft, non tender, no organomegaly or masses. No bruits. Bowel sounds normal. No guarding, tenderness or rebound.    Musculoskeletal exam: Full ROM of spine, hips , shoulders and knees. No deformity ,swelling or crepitus noted. No muscle wasting or atrophy.   Neurologic: Cranial nerves 2 to 12 intact. Power, tone , normal throughout. No disturbance in gait. No tremor.  Skin: Intact, no ulceration, erythema , macular rash noted on trunk Psych; Normal mood and affect. Judgement and concentration normal   Assessment & Plan:  Annual physical exam Annual exam as documented. Counseling done  re healthy lifestyle involving commitment to 150 minutes exercise per week, heart healthy diet, and attaining healthy weight.The importance of adequate sleep also discussed. Regular seat belt use and home safety, is also discussed. Changes in health habits are decided on by the patient with goals and time frames  set for achieving them. Immunization and cancer screening needs are specifically addressed at this visit.   Episodic cigarette smoking  dependence Asked:confirms currently smokes cigarettes 2 to 4/ day Assess: willing to set a quit date,and t is cutting back Advise: needs to QUIT to reduce risk of cancer, cardio and cerebrovascular disease Assist: counseled for 5 minutes and literature provided Wellbutrin prescribed Arrange: follow up in 2 to 4 months

## 2020-02-25 NOTE — Assessment & Plan Note (Signed)
Asked:confirms currently smokes cigarettes 2 to 4/ day Assess: willing to set a quit date,and t is cutting back Advise: needs to QUIT to reduce risk of cancer, cardio and cerebrovascular disease Assist: counseled for 5 minutes and literature provided Wellbutrin prescribed Arrange: follow up in 2 to 4 months

## 2020-02-25 NOTE — Assessment & Plan Note (Signed)

## 2020-02-25 NOTE — Telephone Encounter (Signed)
Request faxed to [facility] for covid vaccine record.

## 2020-03-31 ENCOUNTER — Telehealth: Payer: Self-pay | Admitting: Family Medicine

## 2020-03-31 NOTE — Telephone Encounter (Signed)
Spoke to [name] about scheduling a phone appt with the Managed Medicaid team. Explained what services were available but the patient politely  declined services at this time.

## 2020-05-21 ENCOUNTER — Other Ambulatory Visit: Payer: Self-pay | Admitting: Family Medicine

## 2020-06-19 ENCOUNTER — Other Ambulatory Visit: Payer: Self-pay | Admitting: Family Medicine

## 2020-06-26 ENCOUNTER — Other Ambulatory Visit: Payer: Self-pay | Admitting: Family Medicine

## 2020-06-29 ENCOUNTER — Other Ambulatory Visit: Payer: Self-pay | Admitting: Family Medicine

## 2020-07-07 ENCOUNTER — Other Ambulatory Visit: Payer: Self-pay | Admitting: Family Medicine

## 2020-07-09 ENCOUNTER — Ambulatory Visit: Payer: Medicaid Other | Admitting: Family Medicine

## 2020-07-09 ENCOUNTER — Other Ambulatory Visit: Payer: Self-pay

## 2020-07-09 ENCOUNTER — Inpatient Hospital Stay (HOSPITAL_COMMUNITY): Payer: Medicaid Other

## 2020-07-09 ENCOUNTER — Ambulatory Visit (INDEPENDENT_AMBULATORY_CARE_PROVIDER_SITE_OTHER): Payer: Medicaid Other | Admitting: Internal Medicine

## 2020-07-09 ENCOUNTER — Encounter: Payer: Self-pay | Admitting: Internal Medicine

## 2020-07-09 ENCOUNTER — Ambulatory Visit (HOSPITAL_COMMUNITY): Payer: Medicaid Other

## 2020-07-09 VITALS — BP 138/81 | HR 69 | Temp 97.9°F | Resp 18 | Ht 70.5 in | Wt 168.4 lb

## 2020-07-09 DIAGNOSIS — I10 Essential (primary) hypertension: Secondary | ICD-10-CM | POA: Diagnosis not present

## 2020-07-09 DIAGNOSIS — F17210 Nicotine dependence, cigarettes, uncomplicated: Secondary | ICD-10-CM

## 2020-07-09 DIAGNOSIS — I700 Atherosclerosis of aorta: Secondary | ICD-10-CM

## 2020-07-09 MED ORDER — NICOTINE 7 MG/24HR TD PT24: 7.0000 mg | MEDICATED_PATCH | Freq: Every day | TRANSDERMAL | 1 refills | Status: DC

## 2020-07-09 NOTE — Assessment & Plan Note (Signed)
On Crestor and Aspirin

## 2020-07-09 NOTE — Progress Notes (Signed)
Established Patient Office Visit  Subjective:  Patient ID: Stanley Jimenez, male    DOB: [birth year]  Age: 65 y.o. MRN: [id number]  CC:  Chief Complaint  Patient presents with  . Follow-up    Follow up chronic problems and nicotene dependence pt has not stopped smoking but he has cut back and he feels good today walked to the office     HPI Stanley Jimenez is a 65 year old male with PMH of HTN, aortic atherosclerosis, HLD and tobacco abuse who presents for follow up of his chronic medical conditions.  He has been doing well overall. He has been trying to cut down smoking with the help of Wellbutrin, which has been helping. He smokes only 2-3 cigarettes/day and is willing to continue to cut down. He states that he had quit smoking in the past, and restarted later. But he still has cravings to smoke despite taking Wellbutrin. He is willing to try Nicotine patch.  BP is well-controlled. Takes medications regularly. Patient denies headache, dizziness, chest pain, dyspnea or palpitations.   Past Medical History:  Diagnosis Date  . 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 Anxiety   . Arthritis   . Cirrhosis, alcoholic ([place])   . Clubbing of fingers    congenital  . Depression   . Full dentures   . History of PSVT (paroxysmal supraventricular tachycardia)    run of non-sustatined VT 03-10-2015 in setting of alcohol withdrawal in hospital  . History of seizure    03-08-2012  alcohol withdrawal  . History of thrombocytopenia    10/ 2013  in setting of alcohol withdrawal  . Hyperlipidemia   . Hypertension   . Prostate cancer [facility]) urologist-  dr dalhstedt/  oncologist- dr Tammi Klippel   T1c, Gleason 3+3,  PSA 8.89,  vol 27cc  . 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 BIOPSY  04/14/2016    Procedure: BIOPSY;  Surgeon: Rogene Houston, MD;  Location[facility];  Service: Endoscopy;;  esophagus  . COLONOSCOPY N/A 04/14/2016   Procedure: COLONOSCOPY;  Surgeon: Rogene Houston, MD;  Location: [facility];  Service: Endoscopy;  Laterality: N/A;  . ESOPHAGOGASTRODUODENOSCOPY N/A 04/14/2016   Procedure: ESOPHAGOGASTRODUODENOSCOPY (EGD);  Surgeon: Rogene Houston, MD;  Location: [facility];  Service: Endoscopy;  Laterality: N/A;  1:00  . NO PAST SURGERIES    . PORTACATH PLACEMENT Right 05/31/2016   Procedure: INSERTION OF TUNNELED RIGHT INTERNAL JUGULAR BARD POWERPORT CENTRAL VENOUS CATHETER WITH SUBCUTANEOUS PORT;  Surgeon: Vickie Epley, MD;  Location: [facility];  Service: Vascular;  Laterality: Right;  . RADIOACTIVE SEED IMPLANT N/A 09/20/2014   Procedure: RADIOACTIVE SEED IMPLANT;  Surgeon: Franchot Gallo, MD;  [facility];  Service: Urology;  Laterality: N/A;    77   seeds implanted no seeds found in bladder  . TRANSURETHRAL RESECTION OF PROSTATE      Family History  Problem Relation Age of Onset  . Alcohol abuse Mother   . Diabetes Sister     Social History   Socioeconomic History  . Marital status: Single    Spouse name: Not on file  . Number of children: Not on file  . Years of education: Not on file  . Highest education  level: Not on file  Occupational History  . Not on file  Tobacco Use  . Smoking status: Current Some Day Smoker    Packs/day: 0.50    Years: 20.00    Pack years: 10.00    Types: Cigarettes  . Smokeless tobacco: Former Systems developer    Types: Chew    Quit date: 05/27/1984  . Tobacco comment: only smoking a few  Vaping Use  . Vaping Use: Never used  Substance and Sexual Activity  . Alcohol use: Not on file    Comment: quit drinking in October 2015. He says before this he says he stayed drunk every day.   . Drug use: No    Comment: last use october 2015  . Sexual activity: Never    Birth control/protection: None   Other Topics Concern  . Not on file  Social History Narrative  . Not on file   Social Determinants of Health   Financial Resource Strain: Not on file  Food Insecurity: Not on file  Transportation Needs: Not on file  Physical Activity: Not on file  Stress: Not on file  Social Connections: Not on file  Intimate Partner Violence: Not on file    Outpatient Medications Prior to Visit  Medication Sig Dispense Refill  . amLODipine (NORVASC) 10 MG tablet TAKE 1 TABLET BY MOUTH DAILY 90 tablet 1  . aspirin EC 81 MG tablet Take 1 tablet (81 mg total) by mouth daily. 90 tablet 3  . buPROPion (WELLBUTRIN XL) 150 MG 24 hr tablet TAKE 1 TABLET(150 MG) BY MOUTH DAILY 30 tablet 2  . rosuvastatin (CRESTOR) 20 MG tablet TAKE 1 TABLET(20 MG) BY MOUTH DAILY 30 tablet 3  . tamsulosin (FLOMAX) 0.4 MG CAPS capsule TAKE 1 CAPSULE(0.4 MG) BY MOUTH DAILY 90 capsule 1  . triamcinolone (KENALOG) 0.1 % APPLY TO THE AFFECTED AREA TWICE DAILY 454 g 0   No facility-administered medications prior to visit.    No Known Allergies  ROS Review of Systems  Constitutional: Negative for chills and fever.  HENT: Negative for congestion and sore throat.   Eyes: Negative for pain and discharge.  Respiratory: Negative for cough and shortness of breath.   Cardiovascular: Negative for chest pain and palpitations.  Gastrointestinal: Negative for constipation, diarrhea, nausea and vomiting.  Endocrine: Negative for polydipsia and polyuria.  Genitourinary: Negative for dysuria and hematuria.  Musculoskeletal: Negative for neck pain and neck stiffness.  Skin: Negative for rash.  Neurological: Negative for dizziness, weakness, numbness and headaches.  Psychiatric/Behavioral: Negative for agitation and behavioral problems.      Objective:    Physical Exam Vitals reviewed.  Constitutional:      General: He is not in acute distress.    Appearance: He is not diaphoretic.  HENT:     Head: Normocephalic and  atraumatic.     Nose: Nose normal.     Mouth/Throat:     Mouth: Mucous membranes are moist.  Eyes:     General: No scleral icterus.    Extraocular Movements: Extraocular movements intact.     Pupils: Pupils are equal, round, and reactive to light.  Cardiovascular:     Rate and Rhythm: Normal rate and regular rhythm.     Pulses: Normal pulses.     Heart sounds: Normal heart sounds. No murmur heard.   Pulmonary:     Breath sounds: Normal breath sounds. No wheezing or rales.  Musculoskeletal:     Cervical back: Neck supple. No tenderness.  Right lower leg: No edema.     Left lower leg: No edema.  Skin:    General: Skin is warm.     Findings: No rash.  Neurological:     General: No focal deficit present.     Mental Status: He is alert and oriented to person, place, and time.     Sensory: No sensory deficit.     Motor: No weakness.  Psychiatric:        Mood and Affect: Mood normal.        Behavior: Behavior normal.      BP 138/81 (BP Location: Right Arm, Patient Position: Sitting, Cuff Size: Normal)   Pulse 69   Temp 97.9 F (36.6 C) (Oral)   Resp 18   Ht 5' 10.5" (1.791 m)   Wt 168 lb 6.4 oz (76.4 kg)   SpO2 98%   BMI 23.82 kg/m  Wt Readings from Last 3 Encounters:  07/09/20 168 lb 6.4 oz (76.4 kg)  02/25/20 163 lb 1.9 oz (74 kg)  01/15/20 166 lb 6.4 oz (75.5 kg)     Health Maintenance Due  Topic Date Due  . COVID-19 Vaccine (3 - Moderna risk 4-dose series) 02/27/2020    There are no preventive care reminders to display for this patient.  Lab Results  Component Value Date   TSH 0.586 01/01/2020   Lab Results  Component Value Date   WBC 8.0 01/01/2020   HGB 12.4 (L) 01/01/2020   HCT 38.7 (L) 01/01/2020   MCV 89.0 01/01/2020   PLT 208 01/01/2020   Lab Results  Component Value Date   NA 138 01/01/2020   K 3.9 01/01/2020   CO2 19 (L) 01/01/2020   GLUCOSE 102 (H) 01/01/2020   BUN 15 01/01/2020   CREATININE 1.08 01/01/2020   BILITOT 0.5  01/15/2020   ALKPHOS 87 01/15/2020   AST 20 01/15/2020   ALT 41 01/15/2020   PROT 6.6 01/15/2020   ALBUMIN 4.4 01/15/2020   CALCIUM 8.6 (L) 01/01/2020   ANIONGAP 11 01/01/2020   Lab Results  Component Value Date   CHOL 108 01/15/2020   Lab Results  Component Value Date   HDL 47 01/15/2020   Lab Results  Component Value Date   LDLCALC 46 01/15/2020   Lab Results  Component Value Date   TRIG 68 01/15/2020   Lab Results  Component Value Date   CHOLHDL 2.3 01/15/2020   Lab Results  Component Value Date   HGBA1C 5.9 (H) 01/17/2014      Assessment & Plan:   Problem List Items Addressed This Visit      Cardiovascular and Mediastinum   Essential hypertension - Primary    BP Readings from Last 1 Encounters:  07/09/20 138/81   Well-controlled with Amlodipine 10 mg QD Counseled for compliance with the medications Advised DASH diet and moderate exercise/walking, at least 150 mins/week       Relevant Orders   CBC   Hemoglobin A1c   Aortic atherosclerosis (HCC)    On Crestor and Aspirin      Relevant Orders   CMP14+EGFR   Hemoglobin A1c   Lipid panel     Other   Episodic cigarette smoking dependence    Smokes about 2-3 cigarettes/day  Asked about quitting: confirms that he currently smokes cigarettes Advise to quit smoking: Educated about QUITTING to reduce the risk of cancer, cardio and cerebrovascular disease. Assess willingness: Unwilling to quit at this time, but is working on cutting back. Assist  with counseling and pharmacotherapy: Counseled for 5 minutes and literature provided. Continue Wellbutrin, added Nicotine patch. Arrange for follow up: Follow up in 3 months and continue to offer help.      Relevant Medications   nicotine (NICODERM CQ - DOSED IN MG/24 HR) 7 mg/24hr patch      Meds ordered this encounter  Medications  . nicotine (NICODERM CQ - DOSED IN MG/24 HR) 7 mg/24hr patch    Sig: Place 1 patch (7 mg total) onto the skin daily.     Dispense:  28 patch    Refill:  1    Follow-up: Return in about 4 months (around 11/06/2020).    Lindell Spar, MD

## 2020-07-09 NOTE — Assessment & Plan Note (Signed)
BP Readings from Last 1 Encounters:  07/09/20 138/81   Well-controlled with Amlodipine 10 mg QD Counseled for compliance with the medications Advised DASH diet and moderate exercise/walking, at least 150 mins/week

## 2020-07-09 NOTE — Patient Instructions (Signed)
Please continue to take medications as prescribed.  Please continue to cut down smoking, cut down at least 2 cigarettes per week.  Please apply Nicotine patch to help with smoking cessation.  Please follow low salt diet and perform moderate exercise/walking as tolerated.

## 2020-07-09 NOTE — Assessment & Plan Note (Signed)
Smokes about 2-3 cigarettes/day  Asked about quitting: confirms that he currently smokes cigarettes Advise to quit smoking: Educated about QUITTING to reduce the risk of cancer, cardio and cerebrovascular disease. Assess willingness: Unwilling to quit at this time, but is working on cutting back. Assist with counseling and pharmacotherapy: Counseled for 5 minutes and literature provided. Continue Wellbutrin, added Nicotine patch. Arrange for follow up: Follow up in 3 months and continue to offer help.

## 2020-07-15 ENCOUNTER — Ambulatory Visit (HOSPITAL_COMMUNITY): Payer: Medicaid Other | Admitting: Hematology

## 2020-07-30 ENCOUNTER — Encounter (HOSPITAL_COMMUNITY): Payer: Self-pay | Admitting: Radiology

## 2020-07-30 ENCOUNTER — Other Ambulatory Visit: Payer: Self-pay

## 2020-07-30 ENCOUNTER — Inpatient Hospital Stay (HOSPITAL_COMMUNITY): Payer: Medicaid Other | Attending: Hematology

## 2020-07-30 ENCOUNTER — Ambulatory Visit (HOSPITAL_COMMUNITY)
Admission: RE | Admit: 2020-07-30 | Discharge: 2020-07-30 | Disposition: A | Discharge status: Home / Self Care | Payer: Medicaid Other | Source: Ambulatory Visit | Attending: Hematology | Admitting: Hematology

## 2020-07-30 DIAGNOSIS — F17210 Nicotine dependence, cigarettes, uncomplicated: Secondary | ICD-10-CM | POA: Insufficient documentation

## 2020-07-30 DIAGNOSIS — C61 Malignant neoplasm of prostate: Secondary | ICD-10-CM | POA: Diagnosis not present

## 2020-07-30 DIAGNOSIS — C159 Malignant neoplasm of esophagus, unspecified: Secondary | ICD-10-CM | POA: Insufficient documentation

## 2020-07-30 DIAGNOSIS — Z79899 Other long term (current) drug therapy: Secondary | ICD-10-CM | POA: Diagnosis not present

## 2020-07-30 LAB — COMPREHENSIVE METABOLIC PANEL
ALT: 34 U/L (ref 0–44)
AST: 23 U/L (ref 15–41)
Albumin: 4.1 g/dL (ref 3.5–5.0)
Alkaline Phosphatase: 78 U/L (ref 38–126)
Anion gap: 9 (ref 5–15)
BUN: 16 mg/dL (ref 8–23)
CO2: 24 mmol/L (ref 22–32)
Calcium: 8.7 mg/dL — ABNORMAL LOW (ref 8.9–10.3)
Chloride: 102 mmol/L (ref 98–111)
Creatinine, Ser: 1.03 mg/dL (ref 0.61–1.24)
GFR, Estimated: >60 mL/min (ref >60)
Glucose, Bld: 91 mg/dL (ref 70–99)
Potassium: 3.7 mmol/L (ref 3.5–5.1)
Sodium: 135 mmol/L (ref 135–145)
Total Bilirubin: 0.7 mg/dL (ref 0.3–1.2)
Total Protein: 7.4 g/dL (ref 6.5–8.1)

## 2020-07-30 LAB — CBC WITH DIFFERENTIAL/PLATELET
Abs Immature Granulocytes: 0.04 10*3/uL (ref 0.00–0.07)
Basophils Absolute: 0.1 10*3/uL (ref 0.0–0.1)
Basophils Relative: 1 %
Eosinophils Absolute: 0.4 10*3/uL (ref 0.0–0.5)
Eosinophils Relative: 4 %
HCT: 40.7 % (ref 39.0–52.0)
Hemoglobin: 13.3 g/dL (ref 13.0–17.0)
Immature Granulocytes: 0 %
Lymphocytes Relative: 31 %
Lymphs Abs: 2.9 10*3/uL (ref 0.7–4.0)
MCH: 29.4 pg (ref 26.0–34.0)
MCHC: 32.7 g/dL (ref 30.0–36.0)
MCV: 90 fL (ref 80.0–100.0)
Monocytes Absolute: 1.1 10*3/uL — ABNORMAL HIGH (ref 0.1–1.0)
Monocytes Relative: 12 %
Neutro Abs: 4.9 10*3/uL (ref 1.7–7.7)
Neutrophils Relative %: 52 %
Platelets: 207 10*3/uL (ref 150–400)
RBC: 4.52 MIL/uL (ref 4.22–5.81)
RDW: 14.6 % (ref 11.5–15.5)
WBC: 9.4 10*3/uL (ref 4.0–10.5)
nRBC: 0 % (ref 0.0–0.2)

## 2020-07-30 MED ORDER — IOHEXOL 300 MG/ML  SOLN
100.0000 mL | Freq: Once | INTRAMUSCULAR | Status: AC | PRN
  Administered 2020-07-30: 100 mL via INTRAVENOUS

## 2020-08-05 ENCOUNTER — Other Ambulatory Visit: Payer: Self-pay

## 2020-08-05 ENCOUNTER — Inpatient Hospital Stay (HOSPITAL_BASED_OUTPATIENT_CLINIC_OR_DEPARTMENT_OTHER): Payer: Medicaid Other | Admitting: Hematology

## 2020-08-05 VITALS — BP 138/76 | HR 96 | Temp 97.3°F | Resp 18 | Wt 170.4 lb

## 2020-08-05 DIAGNOSIS — C159 Malignant neoplasm of esophagus, unspecified: Secondary | ICD-10-CM

## 2020-08-05 NOTE — Progress Notes (Signed)
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  Gleason 3+3 = 6 pattern.   08/14/2014 Initial Diagnosis   Malignant neoplasm of prostate [facility])    - 09/20/2014 Radiation Therapy   Brachytherapy with I-125   Malignant tumor of lower third of esophagus ([facility])  04/14/2016 Procedure   EGD by Dr. Laural Golden.   04/16/2016 Pathology Results   Esophagus, biopsy - INVASIVE POORLY DIFFERENTIATED SQUAMOUS CELL CARCINOMA.   04/26/2016 Imaging   CT CAP- 1. Prominent gastroesophageal junction without discrete esophageal mass. Notably, there is also a borderline enlarged enhancing lymph node in the gastrohepatic ligament immediately adjacent to the gastroesophageal junction, which is nonspecific, but could indicate early nodal disease. 2. The small cluster of low-attenuation lesions in segment 7 of the liver are too small to characterize. Statistically, these are likely to represent cysts. If there is clinical concern for metastatic disease to the liver, these could be further characterized with MRI of the abdomen with and without IV gadolinium at this time. 3. No other potential sites of metastatic disease noted elsewhere in the chest, abdomen or pelvis.   05/10/2016 PET scan   Hypermetabolic bilateral lower thoracic esophageal mass, consistent with known primary esophageal carcinoma.  Small hypermetabolic  lymph node in the upper gastrohepatic ligament adjacent to GE junction, consistent with lymph node metastasis.  No other sites of metastatic disease identified.   05/31/2016 Procedure   Port placed by Dr. Rosana Hoes   06/02/2016 - 07/14/2016 Chemotherapy   Weekly Carboplatin/Paclitaxel with XRT.  Tolerated extremely well.    06/02/2016 - 07/14/2016 Radiation Therapy   [place].  Tolerated well.   08/23/2016 PET scan   1. Complete metabolic response. No residual hypermetabolism in the thoracic esophagus or in the upper gastrohepatic ligament lymph node, which is decreased in size. 2. Aortic atherosclerosis. Three-vessel coronary atherosclerosis.   08/24/2016 Miscellaneous   CTS consult with Dr. Servando Snare- "I've recommended to him that surgical resection offers the best chance of cure, if he waits and there is recurrent disease remaining no longer be a surgical candidate. From his discussion today I think he is not inclined to agree but it has been offered."   03/09/2017 Imaging   CT CAP: 1. Mild distal esophageal and GE junction thickening, which appears slightly more prominent compared with PET-CT 08/20/2016. Correlation with PET-CT suggested. 2. Negative for pulmonary nodules or metastatic disease to the chest. 3. Stable subcentimeter posterior right hepatic lobe hypodensities. No definite evidence for metastatic disease to the abdomen or pelvis   04/28/2017 PET scan   No evidence of recurrent or metastatic carcinoma.     CANCER STAGING: Cancer Staging Malignant tumor of lower third of esophagus ([place]) Staging form: Esophagus - Squamous Cell Carcinoma, AJCC 8th Edition - Clinical stage from 06/15/2016: Stage Unknown (cTX, cN1, cM0, L: Lower) - Signed by Baird Cancer, PA-C on 06/15/2016   INTERVAL HISTORY:  Mr. KORD MONETTE, a 65 y.o.  male, returns for routine follow-up of his squamous cell carcinoma of the esophagus. Govani was last seen on 01/08/2020.   Today he reports  feeling well. He denies having any difficulty swallowing or foods that he cannot tolerate and denies having vomiting or nausea or GERD symptoms. He reports having dizziness only when he smokes cigarettes, the last episode being on 03/14. He has nicotine patches which he will start applying again to help him quit smoking. He also continues taking Chantix daily and smokes only half to 2 cigarettes daily now.   REVIEW OF SYSTEMS:  Review of Systems  Constitutional: Negative for appetite change and fatigue.  HENT:   Negative for trouble swallowing.   Gastrointestinal: Negative for nausea and vomiting.  Neurological: Positive for dizziness (when smoking).  All other systems reviewed and are negative.   PAST MEDICAL/SURGICAL HISTORY:  Past Medical History:  Diagnosis Date  . 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 Anxiety   . Arthritis   . Cirrhosis, alcoholic ([place])   . Clubbing of fingers    congenital  . Depression   . Full dentures   . History of PSVT (paroxysmal supraventricular tachycardia)    run of non-sustatined VT 03-10-2015 in setting of alcohol withdrawal in hospital  . History of seizure    03-08-2012  alcohol withdrawal  . History of thrombocytopenia    10/ 2013  in setting of alcohol withdrawal  . Hyperlipidemia   . Hypertension   . Prostate cancer [facility]) urologist-  dr dalhstedt/  oncologist- dr Tammi Klippel   T1c, Gleason 3+3,  PSA 8.89,  vol 27cc  . 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 Squamous cell carcinoma of esophagus ([place]) 04/21/2016   Past Surgical History:  Procedure Laterality Date  . BIOPSY  04/14/2016   Procedure: BIOPSY;  Surgeon: Rogene Houston, MD;  [facility];  Service: Endoscopy;;  esophagus  . COLONOSCOPY N/A 04/14/2016   Procedure: COLONOSCOPY;  Surgeon: Rogene Houston, MD;  [facility];  Service: Endoscopy;  Laterality: N/A;  .  ESOPHAGOGASTRODUODENOSCOPY N/A 04/14/2016   Procedure: ESOPHAGOGASTRODUODENOSCOPY (EGD);  Surgeon: Rogene Houston, MD;  [facility];  Service: Endoscopy;  Laterality: N/A;  1:00  . NO PAST SURGERIES    . PORTACATH PLACEMENT Right 05/31/2016   Procedure: INSERTION OF TUNNELED RIGHT INTERNAL JUGULAR BARD POWERPORT CENTRAL VENOUS CATHETER WITH SUBCUTANEOUS PORT;  Surgeon: Vickie Epley, MD;  Location: [facility];  Service: Vascular;  Laterality: Right;  . RADIOACTIVE SEED IMPLANT N/A 09/20/2014   Procedure: RADIOACTIVE SEED IMPLANT;  Surgeon: Franchot Gallo, MD;  [facility];  Service: Urology;  Laterality: N/A;    77   seeds implanted no seeds found in bladder  . TRANSURETHRAL RESECTION OF PROSTATE      SOCIAL HISTORY:  Social History   Socioeconomic History  . Marital status: Single    Spouse name: Not on file  . Number of children: Not on file  . Years of education: Not on file  . Highest education level: Not on file  Occupational History  . Not on file  Tobacco Use  . Smoking status: Current Some Day Smoker    Packs/day: 0.50    Years: 20.00    Pack years: 10.00    Types: Cigarettes  . Smokeless tobacco: Former Systems developer    Types: Loss adjuster, chartered  Quit date: 05/27/1984  . Tobacco comment: only smoking a few  Vaping Use  . Vaping Use: Never used  Substance and Sexual Activity  . Alcohol use: Not on file    Comment: quit drinking in October 2015. He says before this he says he stayed drunk every day.   . Drug use: No    Comment: last use october 2015  . Sexual activity: Never    Birth control/protection: None  Other Topics Concern  . Not on file  Social History Narrative  . Not on file   Social Determinants of Health   Financial Resource Strain: Not on file  Food Insecurity: Not on file  Transportation Needs: Not on file  Physical Activity: Not on file  Stress: Not on file  Social Connections: Not on file  Intimate Partner Violence: Not on  file    FAMILY HISTORY:  Family History  Problem Relation Age of Onset  . Alcohol abuse Mother   . Diabetes Sister     CURRENT MEDICATIONS:  Current Outpatient Medications  Medication Sig Dispense Refill  . amLODipine (NORVASC) 10 MG tablet TAKE 1 TABLET BY MOUTH DAILY 90 tablet 1  . aspirin EC 81 MG tablet Take 1 tablet (81 mg total) by mouth daily. 90 tablet 3  . buPROPion (WELLBUTRIN XL) 150 MG 24 hr tablet TAKE 1 TABLET(150 MG) BY MOUTH DAILY 30 tablet 2  . nicotine (NICODERM CQ - DOSED IN MG/24 HR) 7 mg/24hr patch Place 1 patch (7 mg total) onto the skin daily. 28 patch 1  . rosuvastatin (CRESTOR) 20 MG tablet TAKE 1 TABLET(20 MG) BY MOUTH DAILY 30 tablet 3  . tamsulosin (FLOMAX) 0.4 MG CAPS capsule TAKE 1 CAPSULE(0.4 MG) BY MOUTH DAILY 90 capsule 1  . triamcinolone (KENALOG) 0.1 % APPLY TO THE AFFECTED AREA TWICE DAILY 454 g 0   No current facility-administered medications for this visit.    ALLERGIES:  No Known Allergies  PHYSICAL EXAM:  Performance status (ECOG): 1 - Symptomatic but completely ambulatory  Vitals:   08/05/20 1344  BP: 138/76  Pulse: 96  Resp: 18  Temp: (!) 97.3 F (36.3 C)  SpO2: 100%   Wt Readings from Last 3 Encounters:  08/05/20 170 lb 6.4 oz (77.3 kg)  07/09/20 168 lb 6.4 oz (76.4 kg)  02/25/20 163 lb 1.9 oz (74 kg)   Physical Exam Vitals reviewed.  Constitutional:      Appearance: Normal appearance.  Cardiovascular:     Rate and Rhythm: Normal rate and regular rhythm.     Pulses: Normal pulses.     Heart sounds: Normal heart sounds.  Pulmonary:     Effort: Pulmonary effort is normal.     Breath sounds: Normal breath sounds.  Chest:  Breasts:     Right: No supraclavicular adenopathy.     Left: No supraclavicular adenopathy.    Abdominal:     Palpations: Abdomen is soft. There is no hepatomegaly, splenomegaly or mass.     Tenderness: There is no abdominal tenderness.  Lymphadenopathy:     Upper Body:     Right upper body: No  supraclavicular adenopathy.     Left upper body: No supraclavicular adenopathy.  Neurological:     General: No focal deficit present.     Mental Status: He is alert and oriented to person, place, and time.  Psychiatric:        Mood and Affect: Mood normal.        Behavior: Behavior normal.  LABORATORY DATA:  I have reviewed the labs as listed.  CBC Latest Ref Rng & Units 07/30/2020 01/01/2020 07/03/2019  WBC 4.0 - 10.5 K/uL 9.4 8.0 8.8  Hemoglobin 13.0 - 17.0 g/dL 13.3 12.4(L) 13.5  Hematocrit 39.0 - 52.0 % 40.7 38.7(L) 41.3  Platelets 150 - 400 K/uL 207 208 185   CMP Latest Ref Rng & Units 07/30/2020 01/15/2020 01/01/2020  Glucose 70 - 99 mg/dL 91 - 102(H)  BUN 8 - 23 mg/dL 16 - 15  Creatinine 0.61 - 1.24 mg/dL 1.03 - 1.08  Sodium 135 - 145 mmol/L 135 - 138  Potassium 3.5 - 5.1 mmol/L 3.7 - 3.9  Chloride 98 - 111 mmol/L 102 - 108  CO2 22 - 32 mmol/L 24 - 19(L)  Calcium 8.9 - 10.3 mg/dL 8.7(L) - 8.6(L)  Total Protein 6.5 - 8.1 g/dL 7.4 6.6 7.2  Total Bilirubin 0.3 - 1.2 mg/dL 0.7 0.5 0.6  Alkaline Phos 38 - 126 U/L 78 87 76  AST 15 - 41 U/L 23 20 24   ALT 0 - 44 U/L 34 41 37    DIAGNOSTIC IMAGING:  I have independently reviewed the scans and discussed with the patient. CT CHEST ABDOMEN PELVIS W CONTRAST  Result Date: 07/31/2020 CLINICAL DATA:  Squamous cell carcinoma of the esophagus status post chemotherapy in 2018, restaging/surveillance. EXAM: CT CHEST, ABDOMEN, AND PELVIS WITH CONTRAST TECHNIQUE: Multidetector CT imaging of the chest, abdomen and pelvis was performed following the standard protocol during bolus administration of intravenous contrast. CONTRAST:  184mL OMNIPAQUE IOHEXOL 300 MG/ML  SOLN COMPARISON:  Multiple priors including CT chest abdomen pelvis July 03, 2019. FINDINGS: CT CHEST FINDINGS Cardiovascular: Unchanged position of the right chest wall Port-A-Cath. Aortic atherosclerosis. No thoracic aortic aneurysm. No central pulmonary embolus. Coronary artery  calcifications. Normal size heart. No pericardial effusion. Mediastinum/Nodes: No mediastinal, hilar or axillary lymphadenopathy. Thyroid is unremarkable. Trachea is unremarkable. Similar mild thickening of the distal esophagus on image 47/2 which is unchanged from multiple prior imaging and accentuated by under distension. Lungs/Pleura: No suspicious pulmonary nodules or masses similar scarring in the paramedian lower lobes. No pleural effusion. No pneumothorax. Musculoskeletal: Multilevel degenerative changes spine. No suspicious lytic or blastic lesions of bone. CT ABDOMEN PELVIS FINDINGS Hepatobiliary: There subcentimeter hypodense hepatic lesions in the posterior right lobe of the liver for instance on image 48/2 and 47/2, which are stable over multiple priors. No new suspicious hepatic lesion. Gallbladder is unremarkable. No biliary ductal dilation. Pancreas: Unremarkable. Spleen: Unremarkable. Adrenals/Urinary Tract: Bilateral adrenal glands are unremarkable. No hydronephrosis. Symmetric enhancement and excretion contrast in bilateral kidneys. No suspicious filling defect within the opacified portions of the collecting system and ureter on delayed imaging. 9 mm right renal cyst. Urinary bladder is grossly unremarkable for degree of distension. Stomach/Bowel: Radiopaque ingested enteric contrast visualized to the level of transverse colon. Stomach is predominantly decompressed but otherwise unremarkable. Normal positioning of the duodenum/ligament of [place]. The appendix is not definitely visualized however there is no pericecal inflammation. Moderate volume of formed stool throughout the colon. No suspicious colonic wall thickening or masslike lesions identified. Vascular/Lymphatic: Aortic atherosclerosis. No abdominal aortic aneurysm. No enlarged abdominal or pelvic lymph nodes. Reproductive: Brachytherapy seeds in the prostate gland. Other: No abdominopelvic ascites. Musculoskeletal: No suspicious lytic or  blastic lesion of bone. Multilevel degenerative change of the spine. Mild degenerative changes bilateral hips. IMPRESSION: 1. No evidence of recurrence or metastatic disease within the chest, abdomen, or pelvis. 2. Stable small low-density lesions in the posterior right  hepatic lobe, appear benign. 3. Moderate volume of formed stool throughout the colon. Correlate for constipation. 4. Aortic atherosclerosis. Aortic Atherosclerosis (ICD10-I70.0). 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  Stable small low-density lesions in the right hepatic lobe benign.  2. Smoking cessation: -He used Chantix and quit smoking in 2020.  He started back again.   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  His weight has been stable. -Physical exam today was within normal limits. -Labs from 07/30/2020 reviewed by me showed normal LFTs and CBC. -We have also reviewed CT CAP from 07/30/2020 which did not show any evidence of metastatic disease or recurrence.  Small low-density lesions in the posterior right hepatic lobe.  Benign. -RTC 6 months with repeat CTAP and labs.  2. Smoking cessation: -He is cutting back on smoking.  He is taking Chantix.  He will also start using nicotine patches prescribed by his PMD.  He is currently smoking 2 cigarettes/day.   Orders placed this encounter:  No orders of the defined types were placed in this encounter.    Derek Jack, MD [place] [phone]   I, Milinda Antis, am acting as a scribe for Dr. Sanda Linger.  I,  Derek Jack MD, have reviewed the above documentation for accuracy and completeness, and I agree with the above.

## 2020-08-05 NOTE — Patient Instructions (Signed)
[place] at [facility] Discharge Instructions  You were seen today by Dr. Delton Coombes. He went over your recent results and scan. You will be scheduled to have a CT scan of your abdomen done before your next visit. Dr. Delton Coombes will see you back in 6 months for labs and follow up.   Thank you for choosing [place] at [facility] to provide your oncology and hematology care.  To afford each patient quality time with our provider, please arrive at least 15 minutes before your scheduled appointment time.   If you have a lab appointment with the [facility] please come in thru the Main Entrance and check in at the main information desk  You need to re-schedule your appointment should you arrive 10 or more minutes late.  We strive to give you quality time with our providers, and arriving late affects you and other patients whose appointments are after yours.  Also, if you no show three or more times for appointments you may be dismissed from the clinic at the providers discretion.     Again, thank you for choosing [facility].  Our hope is that these requests will decrease the amount of time that you wait before being seen by our physicians.       _____________________________________________________________  Should you have questions after your visit to [facility], please contact our office at [phone] between the hours of 8:00 a.m. and 4:30 p.m.  Voicemails left after 4:00 p.m. will not be returned until the following business day.  For prescription refill requests, have your pharmacy contact our office and allow 72 hours.    Cancer Center Support Programs:   > Cancer Support Group  2nd Tuesday of the month 1pm-2pm, Journey Room

## 2020-08-22 NOTE — Progress Notes (Signed)
Cardiology Office Note   Date:  09/01/2020   ID:  Stanley Jimenez, DOB 30-Mar-1956, MRN [id number]  PCP:  [name], MD  Cardiologist:   Jenkins Rouge, MD   No chief complaint on file.     History of Present Illness: Stanley Jimenez is a 65 y.o. male who presents for f/u regarding PVD, HTN, smoking and abnormal chest CT.  He has esophageal cance. CT done 04/26/16 commented on aortic atherosclerosis and calcium seen in LM and all 3 major coronary arteries. Previous alcoholic. Distant history of PSVT in setting of ETOH withdrawal in 2016 CRF;s include smoking, HTN and HL.He is active waling his two dogs. No chest pain dyspnea palpitations. 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 Claudication Has had duplex/ABI's 01/02/20 and seen Dr Fletcher Anon. ABI right 0.88 and left 0.90 with occluded left SFA by duplex   No angina/chest pain Mild claudication RLE only with activity    Past Medical History:  Diagnosis Date  . 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 Anxiety   . Arthritis   . Cirrhosis, alcoholic ([place])   . Clubbing of fingers    congenital  . Depression   . Full dentures   . History of PSVT (paroxysmal supraventricular tachycardia)    run of non-sustatined VT 03-10-2015 in setting of alcohol withdrawal in hospital  . History of seizure    03-08-2012  alcohol withdrawal  . History of thrombocytopenia    10/ 2013  in setting of alcohol withdrawal  . Hyperlipidemia   . Hypertension   . Prostate cancer [facility]) urologist-  dr dalhstedt/  oncologist- dr Tammi Klippel   T1c, Gleason 3+3,  PSA 8.89,  vol 27cc  . 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 BIOPSY  04/14/2016   Procedure: BIOPSY;  Surgeon: Rogene Houston, MD;  [facility];  Service: Endoscopy;;  esophagus  . COLONOSCOPY N/A 04/14/2016   Procedure: COLONOSCOPY;  Surgeon: Rogene Houston, MD;  [facility];  Service: Endoscopy;  Laterality: N/A;  . ESOPHAGOGASTRODUODENOSCOPY N/A 04/14/2016   Procedure: ESOPHAGOGASTRODUODENOSCOPY (EGD);  Surgeon: Rogene Houston, MD;  [facility];  Service: Endoscopy;  Laterality: N/A;  1:00  . NO PAST SURGERIES    . PORTACATH PLACEMENT Right 05/31/2016   Procedure: INSERTION OF TUNNELED RIGHT INTERNAL JUGULAR BARD POWERPORT CENTRAL VENOUS CATHETER WITH SUBCUTANEOUS PORT;  Surgeon: Vickie Epley, MD;  Location: [facility];  Service: Vascular;  Laterality: Right;  . RADIOACTIVE SEED IMPLANT N/A 09/20/2014   Procedure: RADIOACTIVE SEED IMPLANT;  Surgeon: Franchot Gallo, MD;  [facility];  Service: Urology;  Laterality: N/A;    77   seeds implanted no seeds found in bladder  . TRANSURETHRAL RESECTION OF PROSTATE       Current Outpatient Medications  Medication Sig Dispense Refill  . amLODipine (NORVASC) 10 MG tablet TAKE 1 TABLET BY MOUTH DAILY 90 tablet 1  .  aspirin EC 81 MG tablet Take 1 tablet (81 mg total) by mouth daily. 90 tablet 3  . buPROPion (WELLBUTRIN XL) 150 MG 24 hr tablet TAKE 1 TABLET(150 MG) BY MOUTH DAILY 30 tablet 2  . nicotine (NICODERM CQ - DOSED IN MG/24 HR) 7 mg/24hr patch Place 1 patch (7 mg total) onto the skin daily. 28 patch 1  . rosuvastatin (CRESTOR) 20 MG tablet TAKE 1 TABLET(20 MG) BY MOUTH DAILY 30 tablet 3  . tamsulosin (FLOMAX) 0.4 MG CAPS capsule TAKE 1 CAPSULE(0.4 MG) BY MOUTH DAILY 90 capsule 1  . triamcinolone (KENALOG) 0.1 % APPLY TO THE AFFECTED AREA TWICE DAILY 454 g 0   No current  facility-administered medications for this visit.    Allergies:   Patient has no known allergies.    Social History:  The patient  reports that he has been smoking cigarettes. He has a 10.00 pack-year smoking history. He quit smokeless tobacco use about 36 years ago.  His smokeless tobacco use included chew. He reports that he does not use drugs.   Family History:  The patient's family history includes Alcohol abuse in his mother; Diabetes in his sister.    ROS:  Please see the history of present illness.   Otherwise, review of systems are positive for none.   All other systems are reviewed and negative.    PHYSICAL EXAM: VS:  BP 132/68   Pulse 98   Ht 5' 10.5" (1.791 m)   Wt 71.2 kg   SpO2 97%   BMI 22.21 kg/m  , BMI Body mass index is 22.21 kg/m. Affect appropriate Thin black male  HEENT: normal Neck supple with no adenopathy JVP normal no bruits no thyromegaly Lungs clear with no wheezing and good diaphragmatic motion Heart:  S1/S21/6 SEM  murmur, no rub, gallop or click  Port a cath under right clavicle  PMI normal Abdomen: benighn, BS positve, no tenderness, no AAA no bruit.  No HSM or HJR Distal pulses hard to palpate bilaterally  No edema  Neuro non-focal Skin warm and dry No muscular weakness   EKG:  05/27/16  SR rate 68 normal ECG  11/18/16 SR rate 94 septal Q lead placement otherwise normal  NSR Inferior T wave inversion new since 10/02/19   Recent Labs: 01/01/2020: TSH 0.586 07/30/2020: ALT 34; BUN 16; Creatinine, Ser 1.03; Hemoglobin 13.3; Platelets 207; Potassium 3.7; Sodium 135    Lipid Panel    Component Value Date/Time   CHOL 108 01/15/2020 1028   TRIG 68 01/15/2020 1028   HDL 47 01/15/2020 1028   CHOLHDL 2.3 01/15/2020 1028   CHOLHDL 3.4 04/09/2019 0839   VLDL 12 10/20/2016 0917   LDLCALC 46 01/15/2020 1028   LDLCALC 108 (H) 04/09/2019 0839      Wt Readings from Last 3 Encounters:  09/01/20 71.2 kg  08/05/20 77.3 kg  07/09/20 76.4 kg       Other studies Reviewed: Additional studies/ records that were reviewed today include: CT chest 2017 office notes labs and ECG Dr Moshe Cipro.    ASSESSMENT AND PLAN:  1.  CAD seen only on CT. Not unexpected in his age group with smoking Asymptomatic with normal ECG And active. No indication for stress testing   2. HTN Well controlled.  Continue current medications and low sodium Dash type diet.    3. HLD:  diet Rx f/u labs with primary   4. Esophageal cancer :  Squamous cell at GE junction Rx chemoradiation refused surgery has  been stable by CT 07/03/19 with no evidence of metastatic  Disease   5. Smoking  Counseled on cessation no active wheezing f/u primary has CT;s for his esophageal cancer CT 07/30/20 no suspicious lesions chronic scarring in paramedian lower lobes Down to 3-4 cigs/day using Nicoderm and Wellbutrin   6. ETOH Abuse:  With history of cirrhosis abstaining   7. PVD:  F/U [facility]  01/02/20 right ABI 0.88 left 0.90 with occluded left SFA by duplex Not lifestyle Limiting observe see above regarding smoking cessation    Current medicines are reviewed at length with the patient today.  The patient does not have concerns regarding medicines.  The following changes have been made:  no change  Labs/ tests ordered today include: ABI's August 2022 with F/U [facility] for PVD  Orders Placed This Encounter  Procedures  . EKG 12-Lead     Disposition:   FU with cardiology in a year     Signed, Jenkins Rouge, MD  09/01/2020 10:38 AM    [facility] [place], [place], [place]  [address] Phone: [phone]; Fax: [phone]

## 2020-09-01 ENCOUNTER — Other Ambulatory Visit: Payer: Self-pay

## 2020-09-01 ENCOUNTER — Ambulatory Visit (INDEPENDENT_AMBULATORY_CARE_PROVIDER_SITE_OTHER): Payer: Medicaid Other | Admitting: Cardiovascular Disease

## 2020-09-01 ENCOUNTER — Encounter: Payer: Self-pay | Admitting: Cardiovascular Disease

## 2020-09-01 VITALS — BP 132/68 | HR 98 | Ht 70.5 in | Wt 157.0 lb

## 2020-09-01 DIAGNOSIS — I2510 Atherosclerotic heart disease of native coronary artery without angina pectoris: Secondary | ICD-10-CM | POA: Diagnosis not present

## 2020-09-01 DIAGNOSIS — I739 Peripheral vascular disease, unspecified: Secondary | ICD-10-CM

## 2020-09-01 DIAGNOSIS — F17200 Nicotine dependence, unspecified, uncomplicated: Secondary | ICD-10-CM

## 2020-09-01 NOTE — Patient Instructions (Signed)
Medication Instructions:  Your physician recommends that you continue on your current medications as directed. Please refer to the Current Medication list given to you today.  *If you need a refill on your cardiac medications before your next appointment, please call your pharmacy*   Lab Work: None today  If you have labs (blood work) drawn today and your tests are completely normal, you will receive your results only by: MyChart Message (if you have MyChart) OR A paper copy in the mail If you have any lab test that is abnormal or we need to change your treatment, we will call you to review the results.   Testing/Procedures: None today    Follow-Up: At [facility], you and your health needs are our priority.  As part of our continuing mission to provide you with exceptional heart care, we have created designated Provider Care Teams.  These Care Teams include your primary Cardiologist (physician) and Advanced Practice Providers (APPs -  Physician Assistants and Nurse Practitioners) who all work together to provide you with the care you need, when you need it.  We recommend signing up for the patient portal called "MyChart".  Sign up information is provided on this After Visit Summary.  MyChart is used to connect with patients for Virtual Visits (Telemedicine).  Patients are able to view lab/test results, encounter notes, upcoming appointments, etc.  Non-urgent messages can be sent to your provider as well.   To learn more about what you can do with MyChart, go to [email or web address].[email or web address].    Your next appointment:   12 month(s)  The format for your next appointment:   In Person  Provider:   Peter Nishan, MD   Other Instructions None     

## 2020-10-02 ENCOUNTER — Other Ambulatory Visit: Payer: Self-pay | Admitting: Family Medicine

## 2020-10-28 ENCOUNTER — Other Ambulatory Visit: Payer: Self-pay | Admitting: Family Medicine

## 2020-11-06 ENCOUNTER — Ambulatory Visit: Payer: Medicaid Other | Admitting: Family Medicine

## 2020-11-21 DIAGNOSIS — I10 Essential (primary) hypertension: Secondary | ICD-10-CM | POA: Diagnosis not present

## 2020-11-21 DIAGNOSIS — I700 Atherosclerosis of aorta: Secondary | ICD-10-CM | POA: Diagnosis not present

## 2020-11-21 DIAGNOSIS — E785 Hyperlipidemia, unspecified: Secondary | ICD-10-CM | POA: Diagnosis not present

## 2020-11-22 LAB — CMP14+EGFR
ALT: 24 IU/L (ref 0–44)
AST: 23 IU/L (ref 0–40)
Albumin/Globulin Ratio: 1.4 (ref 1.2–2.2)
Albumin: 4 g/dL (ref 3.8–4.8)
Alkaline Phosphatase: 126 IU/L — ABNORMAL HIGH (ref 44–121)
BUN/Creatinine Ratio: 15 (ref 10–24)
BUN: 17 mg/dL (ref 8–27)
Bilirubin Total: 0.5 mg/dL (ref 0.0–1.2)
CO2: 20 mmol/L (ref 20–29)
Calcium: 9.3 mg/dL (ref 8.6–10.2)
Chloride: 104 mmol/L (ref 96–106)
Creatinine, Ser: 1.17 mg/dL (ref 0.76–1.27)
Globulin, Total: 2.9 g/dL (ref 1.5–4.5)
Glucose: 93 mg/dL (ref 65–99)
Potassium: 4.7 mmol/L (ref 3.5–5.2)
Sodium: 140 mmol/L (ref 134–144)
Total Protein: 6.9 g/dL (ref 6.0–8.5)
eGFR: 70 mL/min/{1.73_m2} (ref >59)

## 2020-11-22 LAB — CBC
Hematocrit: 37.6 % (ref 37.5–51.0)
Hemoglobin: 12.1 g/dL — ABNORMAL LOW (ref 13.0–17.7)
MCH: 27.8 pg (ref 26.6–33.0)
MCHC: 32.2 g/dL (ref 31.5–35.7)
MCV: 86 fL (ref 79–97)
Platelets: 250 10*3/uL (ref 150–450)
RBC: 4.35 x10E6/uL (ref 4.14–5.80)
RDW: 14.2 % (ref 11.6–15.4)
WBC: 9.3 10*3/uL (ref 3.4–10.8)

## 2020-11-22 LAB — LIPID PANEL
Chol/HDL Ratio: 2.3 ratio (ref 0.0–5.0)
Cholesterol, Total: 113 mg/dL (ref 100–199)
HDL: 49 mg/dL (ref >39)
LDL Chol Calc (NIH): 51 mg/dL (ref 0–99)
Triglycerides: 60 mg/dL (ref 0–149)
VLDL Cholesterol Cal: 13 mg/dL (ref 5–40)

## 2020-11-22 LAB — HEMOGLOBIN A1C
Est. average glucose Bld gHb Est-mCnc: 140 mg/dL
Hgb A1c MFr Bld: 6.5 % — ABNORMAL HIGH (ref 4.8–5.6)

## 2020-11-27 ENCOUNTER — Other Ambulatory Visit: Payer: Self-pay | Admitting: Family Medicine

## 2020-12-01 ENCOUNTER — Ambulatory Visit (INDEPENDENT_AMBULATORY_CARE_PROVIDER_SITE_OTHER): Payer: Medicaid Other | Admitting: Family Medicine

## 2020-12-01 ENCOUNTER — Other Ambulatory Visit: Payer: Self-pay

## 2020-12-01 ENCOUNTER — Encounter: Payer: Self-pay | Admitting: Family Medicine

## 2020-12-01 VITALS — BP 136/80 | Resp 16 | Ht 71.0 in | Wt 166.8 lb

## 2020-12-01 DIAGNOSIS — I10 Essential (primary) hypertension: Secondary | ICD-10-CM | POA: Diagnosis not present

## 2020-12-01 DIAGNOSIS — Z23 Encounter for immunization: Secondary | ICD-10-CM

## 2020-12-01 DIAGNOSIS — F17210 Nicotine dependence, cigarettes, uncomplicated: Secondary | ICD-10-CM

## 2020-12-01 DIAGNOSIS — E785 Hyperlipidemia, unspecified: Secondary | ICD-10-CM | POA: Diagnosis not present

## 2020-12-01 NOTE — Patient Instructions (Signed)
Annual exam in office mid to end October, call if you need me before    Pneumonia 23 vaccine today   Get covid booster next week at pharmacy please  Stop sodas and reduce sugar as blood sugar is high   You are referred for chest scan for lung cancer screening  CONGRATS on quitting smoking since November 12, 2020  It is important that you exercise regularly at least 30 minutes 5 times a week. If you develop chest pain, have severe difficulty breathing, or feel very tired, stop exercising immediately and seek medical attention   Thanks for choosing [facility], we consider it a privelige to serve you.

## 2020-12-01 NOTE — Assessment & Plan Note (Signed)
Asked:confirms currently quit 3 weeks ago, needs annual chest scan, continue wellbutrin 150 mg daily

## 2020-12-01 NOTE — Assessment & Plan Note (Signed)
Hyperlipidemia:Low fat diet discussed and encouraged.   Lipid Panel  Lab Results  Component Value Date   CHOL 113 11/21/2020   HDL 49 11/21/2020   LDLCALC 51 11/21/2020   TRIG 60 11/21/2020   CHOLHDL 2.3 11/21/2020

## 2020-12-01 NOTE — Assessment & Plan Note (Signed)
Controlled, no change in medication DASH diet and commitment to daily physical activity for a minimum of 30 minutes discussed and encouraged, as a part of hypertension management. The importance of attaining a healthy weight is also discussed.  BP/Weight 12/01/2020 09/01/2020 08/05/2020 07/09/2020 02/25/2020 01/15/2020 6/64/4034  Systolic BP 742 595 638 756 433 295 188  Diastolic BP 80 68 76 81 68 64 81  Wt. (Lbs) 166.8 157 170.4 168.4 163.12 166.4 167.6  BMI 23.26 22.21 24.1 23.82 23.07 23.54 23.38

## 2020-12-01 NOTE — Progress Notes (Signed)
   Stanley Jimenez     MRN: [id number]      DOB: 03/04/1956   HPI Stanley Jimenez is here for follow up and re-evaluation of chronic medical conditions, medication management and review of any available recent lab and radiology data.  Preventive health is updated, specifically  Cancer screening and Immunization.   Questions or concerns regarding consultations or procedures which the PT has had in the interim are  addressed. The PT denies any adverse reactions to current medications since the last visit.  There are no new concerns.  There are no specific complaints   ROS Denies recent fever or chills. Denies sinus pressure, nasal congestion, ear pain or sore throat. Denies chest congestion, productive cough or wheezing. Denies chest pains, palpitations and leg swelling Denies abdominal pain, nausea, vomiting,diarrhea or constipation.   Denies dysuria, frequency, hesitancy or incontinence. Denies joint pain, swelling and limitation in mobility. Denies headaches, seizures, numbness, or tingling. Denies depression, anxiety or insomnia. Denies skin break down or rash.   PE  BP 136/80   Resp 16   Ht 5\' 11"  (1.803 m)   Wt 166 lb 12.8 oz (75.7 kg)   BMI 23.26 kg/m   Patient alert and oriented and in no cardiopulmonary distress.  HEENT: No facial asymmetry, EOMI,     Neck supple .  Chest: Clear to auscultation bilaterally.  CVS: S1, S2 no murmurs, no S3.Regular rate.  ABD: Soft non tender.   Ext: No edema  MS: Adequate ROM spine, shoulders, hips and knees.  Skin: Intact, no ulcerations or rash noted.  Psych: Good eye contact, normal affect. Memory intact not anxious or depressed appearing.  CNS: CN 2-12 intact, power,  normal throughout.no focal deficits noted.   Assessment & Plan  Episodic cigarette smoking dependence Asked:confirms currently quit 3 weeks ago, needs annual chest scan, continue wellbutrin 150 mg daily    Essential hypertension Controlled, no change in  medication DASH diet and commitment to daily physical activity for a minimum of 30 minutes discussed and encouraged, as a part of hypertension management. The importance of attaining a healthy weight is also discussed.  BP/Weight 12/01/2020 09/01/2020 08/05/2020 07/09/2020 02/25/2020 01/15/2020 2/70/6237  Systolic BP 628 315 176 160 737 106 269  Diastolic BP 80 68 76 81 68 64 81  Wt. (Lbs) 166.8 157 170.4 168.4 163.12 166.4 167.6  BMI 23.26 22.21 24.1 23.82 23.07 23.54 23.38       HLD (hyperlipidemia) Hyperlipidemia:Low fat diet discussed and encouraged.   Lipid Panel  Lab Results  Component Value Date   CHOL 113 11/21/2020   HDL 49 11/21/2020   LDLCALC 51 11/21/2020   TRIG 60 11/21/2020   CHOLHDL 2.3 11/21/2020

## 2020-12-03 ENCOUNTER — Encounter (HOSPITAL_COMMUNITY): Payer: Self-pay | Admitting: *Deleted

## 2020-12-03 ENCOUNTER — Emergency Department (HOSPITAL_COMMUNITY): Payer: Medicaid Other

## 2020-12-03 ENCOUNTER — Other Ambulatory Visit: Payer: Self-pay

## 2020-12-03 ENCOUNTER — Emergency Department (HOSPITAL_COMMUNITY)
Admission: EM | Admit: 2020-12-03 | Discharge: 2020-12-03 | Disposition: A | Discharge status: Home / Self Care | Payer: Medicaid Other | Attending: Emergency Medicine | Admitting: Emergency Medicine

## 2020-12-03 DIAGNOSIS — S39011A Strain of muscle, fascia and tendon of abdomen, initial encounter: Secondary | ICD-10-CM | POA: Insufficient documentation

## 2020-12-03 DIAGNOSIS — R109 Unspecified abdominal pain: Secondary | ICD-10-CM | POA: Diagnosis not present

## 2020-12-03 DIAGNOSIS — I10 Essential (primary) hypertension: Secondary | ICD-10-CM | POA: Diagnosis not present

## 2020-12-03 DIAGNOSIS — Z87891 Personal history of nicotine dependence: Secondary | ICD-10-CM | POA: Insufficient documentation

## 2020-12-03 DIAGNOSIS — Z8501 Personal history of malignant neoplasm of esophagus: Secondary | ICD-10-CM | POA: Diagnosis not present

## 2020-12-03 DIAGNOSIS — S3991XA Unspecified injury of abdomen, initial encounter: Secondary | ICD-10-CM | POA: Diagnosis present

## 2020-12-03 DIAGNOSIS — Z7982 Long term (current) use of aspirin: Secondary | ICD-10-CM | POA: Diagnosis not present

## 2020-12-03 DIAGNOSIS — R079 Chest pain, unspecified: Secondary | ICD-10-CM | POA: Diagnosis not present

## 2020-12-03 DIAGNOSIS — X58XXXA Exposure to other specified factors, initial encounter: Secondary | ICD-10-CM | POA: Insufficient documentation

## 2020-12-03 DIAGNOSIS — Z8546 Personal history of malignant neoplasm of prostate: Secondary | ICD-10-CM | POA: Insufficient documentation

## 2020-12-03 DIAGNOSIS — Z79899 Other long term (current) drug therapy: Secondary | ICD-10-CM | POA: Insufficient documentation

## 2020-12-03 LAB — TROPONIN I (HIGH SENSITIVITY): Troponin I (High Sensitivity): 9 ng/L (ref <18)

## 2020-12-03 MED ORDER — NAPROXEN 500 MG PO TABS: 500.0000 mg | ORAL_TABLET | Freq: Two times a day (BID) | ORAL | 0 refills | Status: DC

## 2020-12-03 NOTE — ED Triage Notes (Signed)
Pain in abdomen and chest for the past 2 days

## 2020-12-03 NOTE — Discharge Instructions (Addendum)
Your history and exam suggest that you have strained your abdominal muscles.  I recommend an anti-inflammatory medication which has been prescribed.  In addition a gentle heating pad applied to your abdomen for 20 minutes several times daily may also help to heal this quicker.  Your blood work, chest x-ray and EKG are reassuring.

## 2020-12-03 NOTE — ED Provider Notes (Signed)
[facility] EMERGENCY DEPARTMENT Provider Note   CSN: [id number] Arrival date & time: 12/03/20  1139     History Chief Complaint  Patient presents with   Abdominal Pain    Stanley Jimenez is a 65 y.o. male with a distant history of alcohol abuse, known cirrhosis, alcohol withdrawal seizure disorder, hypertension and hyperlipidemia, peripheral vascular disease presenting for evaluation of abdominal and chest pain.  He describes a sharp sensation in his mid abdomen which radiates into his chest with positional changes, specifically when he is moving from a supine to a sitting and standing position (abdominal crunch movement which he demonstrates).  Once he has changed his position his symptoms resolved.  This started 2 days ago.  He denies shortness of breath, nausea, vomiting, fevers or any bowel changes.  Also no dysuria or flank pain.  He denies any injuries including direct blow or fall to his chest or abdomen.  He has had no treatment prior to arrival and is found no alleviators for symptoms.  The history is provided by the patient.      Past Medical History:  Diagnosis Date   Alcohol abuse, in remission    SINCE 10- 2015   Anxiety    Arthritis    Cirrhosis, alcoholic ([facility])    Clubbing of fingers    congenital   Depression    Full dentures    History of PSVT (paroxysmal supraventricular tachycardia)    run of non-sustatined VT 03-10-2015 in setting of alcohol withdrawal in hospital   History of seizure    03-08-2012  alcohol withdrawal   History of thrombocytopenia    10/ 2013  in setting of alcohol withdrawal   Hyperlipidemia    Hypertension    Prostate cancer [facility]) urologist-  dr dalhstedt/  oncologist- dr Tammi Klippel   T1c, Gleason 3+3,  PSA 8.89,  vol 27cc   Psoriasis, 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   Seizures ([place])    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    Patient Active Problem List    Diagnosis Date Noted   Aortic atherosclerosis ([place]) 07/09/2020   Episodic cigarette smoking dependence 11/06/2019   PAD (peripheral artery disease) ([place]) 11/06/2019   Abnormal CT scan, chest 10/29/2016   Malignant tumor of lower third of esophagus ([place]) 04/21/2016   Malignant neoplasm of prostate ([place]) 08/14/2014   HLD (hyperlipidemia) 11/09/2011   Psoriasis, 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   H/O alcohol dependence ([place]) 01/26/2008   Essential hypertension 12/22/2007    Past Surgical History:  Procedure Laterality Date   BIOPSY  04/14/2016   Procedure: BIOPSY;  Surgeon: Rogene [place], MD;  [facility];  Service: Endoscopy;;  esophagus   COLONOSCOPY N/A 04/14/2016   Procedure: COLONOSCOPY;  Surgeon: Rogene Houston, MD;  [facility];  Service: Endoscopy;  Laterality: N/A;   ESOPHAGOGASTRODUODENOSCOPY N/A 04/14/2016   Procedure: ESOPHAGOGASTRODUODENOSCOPY (EGD);  Surgeon: Rogene Houston, MD;  [facility];  Service: Endoscopy;  Laterality: N/A;  1:00   NO PAST SURGERIES     PORTACATH PLACEMENT Right 05/31/2016   Procedure: INSERTION OF TUNNELED RIGHT INTERNAL JUGULAR BARD POWERPORT CENTRAL VENOUS CATHETER WITH SUBCUTANEOUS PORT;  Surgeon: Vickie Epley, MD;  Location: [facility];  Service: Vascular;  Laterality: Right;   RADIOACTIVE SEED IMPLANT N/A 09/20/2014   Procedure: RADIOACTIVE SEED IMPLANT;  Surgeon: Franchot Gallo, MD;  Location: [facility];  Service: Urology;  Laterality:  N/A;    30   seeds implanted no seeds found in bladder   TRANSURETHRAL RESECTION OF PROSTATE         Family History  Problem Relation Age of Onset   Alcohol abuse Mother    Diabetes Sister     Social History   Tobacco Use   Smoking status: Former    Packs/day: 0.50    Years: 20.00    Pack years: 10.00    Types: Cigarettes    Quit date: 11/12/2020    Years since quitting: 0.0   Smokeless tobacco: Former    Types: Chew    Quit date: 05/27/1984   Tobacco  comments:    only smoking a few  Vaping Use   Vaping Use: Never used  Substance Use Topics   Drug use: No    Comment: last use october 2015    Home Medications Prior to Admission medications   Medication Sig Start Date End Date Taking? Authorizing Provider  naproxen (NAPROSYN) 500 MG tablet Take 1 tablet (500 mg total) by mouth 2 (two) times daily. 12/03/20  Yes Ivanna Kocak, Almyra Free, PA-C  amLODipine (NORVASC) 10 MG tablet TAKE 1 TABLET BY MOUTH DAILY 06/30/20   [name], MD  aspirin EC 81 MG tablet Take 1 tablet (81 mg total) by mouth daily. 10/02/19   Wellington [place], MD  buPROPion (WELLBUTRIN XL) 150 MG 24 hr tablet TAKE 1 TABLET(150 MG) BY MOUTH DAILY 10/02/20   [name], MD  rosuvastatin (CRESTOR) 20 MG tablet TAKE 1 TABLET(20 MG) BY MOUTH DAILY 10/28/20   [name], MD  tamsulosin (FLOMAX) 0.4 MG CAPS capsule TAKE 1 CAPSULE(0.4 MG) BY MOUTH DAILY 11/27/20   [name], MD  triamcinolone (KENALOG) 0.1 % APPLY TO THE AFFECTED AREA TWICE DAILY 07/07/20   [name], MD    Allergies    Patient has no known allergies.  Review of Systems   Review of Systems  Constitutional:  Negative for fever.  HENT:  Negative for congestion and sore throat.   Eyes: Negative.   Respiratory:  Negative for chest tightness and shortness of breath.   Cardiovascular:  Positive for chest pain.  Gastrointestinal:  Positive for abdominal pain. Negative for constipation, diarrhea, nausea and vomiting.  Genitourinary: Negative.   Musculoskeletal:  Negative for arthralgias, joint swelling and neck pain.  Skin: Negative.  Negative for rash and wound.  Neurological:  Negative for dizziness, weakness, light-headedness, numbness and headaches.  Psychiatric/Behavioral: Negative.     Physical Exam Updated Vital Signs BP 126/72 (BP Location: Right Arm)   Pulse 88   Temp 98 F (36.7 C) (Oral)   Resp 17   SpO2 96%   Physical Exam Vitals and nursing note reviewed.   Constitutional:      Appearance: He is well-developed.  HENT:     Head: Normocephalic and atraumatic.  Eyes:     Conjunctiva/sclera: Conjunctivae normal.  Cardiovascular:     Rate and Rhythm: Normal rate and regular rhythm.     Heart sounds: Normal heart sounds.  Pulmonary:     Effort: Pulmonary effort is normal.     Breath sounds: Normal breath sounds. No wheezing or rhonchi.  Chest:     Chest wall: No tenderness.  Abdominal:     General: Bowel sounds are normal.     Palpations: Abdomen is soft.     Tenderness: There is no abdominal tenderness.     Comments: No reproducible pain of the  abdomen or chest to palpation.  Abdomen is soft and nontender, no guarding, no distention.  There are no palpable masses.  Patient demonstrates pain from his umbilicus to his lower midsternal region with sitting from a supine position.    Musculoskeletal:        General: Normal range of motion.     Cervical back: Normal range of motion.  Skin:    General: Skin is warm and dry.  Neurological:     Mental Status: He is alert.    ED Results / Procedures / Treatments   Labs (all labs ordered are listed, but only abnormal results are displayed) Labs Reviewed  TROPONIN I (HIGH SENSITIVITY)    EKG EKG Interpretation  Date/Time:  Wednesday December 03 2020 12:34:52 EDT Ventricular Rate:  88 PR Interval:  175 QRS Duration: 91 QT Interval:  377 QTC Calculation: 457 R Axis:   69 Text Interpretation: Sinus rhythm Nonspecific T wave abnormality Confirmed by Lajean Saver [phone]) on 12/03/2020 12:44:44 PM  Radiology DG Chest 2 View  Result Date: 12/03/2020 CLINICAL DATA:  Chest pain and lower abdominal pain for 2 days. History of squamous cell carcinoma of the esophagus with chemotherapy in 2018 EXAM: CHEST - 2 VIEW COMPARISON:  CT chest 07/30/2020 FINDINGS: Power injectable right IJ Port-A-Cath tip: Right atrium. Old healed left posterolateral rib deformities compatible with prior fractures. Minimal  peripheral scarring in the right upper lobe just above the minor fissure. Cardiac and mediastinal margins appear normal. No blunting of the costophrenic angles. IMPRESSION: 1.  No active cardiopulmonary disease is radiographically apparent. 2. Minimal peripheral scarring in the right upper lobe Electronically Signed   By: Van Clines M.D.   On: 12/03/2020 13:10    Procedures Procedures   Medications Ordered in ED Medications - No data to display  ED Course  I have reviewed the triage vital signs and the nursing notes.  Pertinent labs & imaging results that were available during my care of the patient were reviewed by me and considered in my medical decision making (see chart for details).    MDM Rules/Calculators/A&P                          Suspect body wall/musculoskeletal pain/strain.  His exam and history is benign.  He does have some nonspecific T wave changes on his EKG, therefore will obtain 1 troponin.  If normal we will plan to treat for MSK pain and close follow-up with his PCP.  Patient was seen by Dr. Ashok Cordia during this ED visit. Final Clinical Impression(s) / ED Diagnoses Final diagnoses:  Abdominal muscle strain, initial encounter    Rx / DC Orders ED Discharge Orders          Ordered    naproxen (NAPROSYN) 500 MG tablet  2 times daily        12/03/20 1446             Evalee Jefferson, PA-C 12/03/20 1446    Lajean Saver, MD 12/06/20 1544

## 2020-12-04 ENCOUNTER — Telehealth: Payer: Self-pay

## 2020-12-04 NOTE — Telephone Encounter (Signed)
Transition Care Management Follow-up Telephone Call Date of discharge and from where: 12/03/2020-[facility] How have you been since you were released from the hospital? Doing better  Any questions or concerns? No  Items Reviewed: Did the pt receive and understand the discharge instructions provided? Yes  Medications obtained and verified? Yes  Other? No  Any new allergies since your discharge? No  Dietary orders reviewed? N/A Do you have support at home? Yes   Home Care and Equipment/Supplies: Were home health services ordered? not applicable If so, what is the name of the agency? N/A  Has the agency set up a time to come to the patient's home? not applicable Were any new equipment or medical supplies ordered?  No What is the name of the medical supply agency? N/A Were you able to get the supplies/equipment? not applicable Do you have any questions related to the use of the equipment or supplies? No  Functional Questionnaire: (I = Independent and D = Dependent) ADLs: I  Bathing/Dressing- I  Meal Prep- I  Eating- I  Maintaining continence- I  Transferring/Ambulation- I  Managing Meds- I  Follow up appointments reviewed:  PCP Hospital f/u appt confirmed? No  will contact pcp in a week if not feeling better. [facility] f/u appt confirmed? No   Are transportation arrangements needed? No  If their condition worsens, is the pt aware to call PCP or go to the Emergency Dept.? Yes Was the patient provided with contact information for the PCP's office or ED? Yes Was to pt encouraged to call back with questions or concerns? Yes

## 2020-12-10 ENCOUNTER — Encounter: Payer: Self-pay | Admitting: Family Medicine

## 2020-12-10 ENCOUNTER — Other Ambulatory Visit: Payer: Self-pay

## 2020-12-10 ENCOUNTER — Ambulatory Visit (INDEPENDENT_AMBULATORY_CARE_PROVIDER_SITE_OTHER): Payer: Medicaid Other | Admitting: Family Medicine

## 2020-12-10 VITALS — BP 128/78 | HR 96 | Resp 16 | Ht 71.0 in | Wt 165.8 lb

## 2020-12-10 DIAGNOSIS — Z09 Encounter for follow-up examination after completed treatment for conditions other than malignant neoplasm: Secondary | ICD-10-CM

## 2020-12-10 DIAGNOSIS — I10 Essential (primary) hypertension: Secondary | ICD-10-CM | POA: Diagnosis not present

## 2020-12-10 NOTE — Patient Instructions (Addendum)
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, we consider it a privelige to serve you.

## 2020-12-14 ENCOUNTER — Encounter: Payer: Self-pay | Admitting: Family Medicine

## 2020-12-14 DIAGNOSIS — Z09 Encounter for follow-up examination after completed treatment for conditions other than malignant neoplasm: Secondary | ICD-10-CM | POA: Insufficient documentation

## 2020-12-14 NOTE — Assessment & Plan Note (Signed)
Patient in for follow up of recent ED visit Discharge summary, and laboratory and radiology data are reviewed, and any questions or concerns  are discussed. Denies any chest or abdominal pain for the last 4 days, advise him to stop the naproxen, which he had been taking once and not twice daily as prescribed

## 2020-12-14 NOTE — Assessment & Plan Note (Signed)
Controlled, no change in medication DASH diet and commitment to daily physical activity for a minimum of 30 minutes discussed and encouraged, as a part of hypertension management. The importance of attaining a healthy weight is also discussed.  BP/Weight 12/10/2020 12/03/2020 12/01/2020 09/01/2020 08/05/2020 07/09/2020 Q000111Q  Systolic BP 0000000 [id number] [id number] Q000111Q 0000000 0000000 123456  Diastolic BP 78 72 80 68 76 81 68  Wt. (Lbs) 165.8 - 166.8 157 170.4 168.4 163.12  BMI 23.12 - 23.26 22.21 24.1 23.82 23.07

## 2020-12-14 NOTE — Progress Notes (Signed)
   Stanley Jimenez     MRN: [id number]      DOB: May 26, 1955   HPI Mr. Untiedt is here for follow up of Ed visit on 12/03/2020, when he presented with chest and abdominal pain, no longer having any pain, feels well/ back to baseline. ED visit  reviewed with pt and all questions were answered Pain deemed musculoskeletal, and wa prescribed naproxen  ROS Denies recent fever or chills. Denies sinus pressure, nasal congestion, ear pain or sore throat. Denies chest congestion, productive cough or wheezing. Denies chest pains, palpitations and leg swelling Denies abdominal pain, nausea, vomiting,diarrhea or constipation.   Denies dysuria, frequency, hesitancy or incontinence. Denies joint pain, swelling and limitation in mobility. Denies headaches, seizures, numbness, or tingling. Denies depression, anxiety or insomnia. Denies skin break down or rash.   PE  BP 128/78   Pulse 96   Resp 16   Ht '5\' 11"'$  (1.803 m)   Wt 165 lb 12.8 oz (75.2 kg)   SpO2 96%   BMI 23.12 kg/m   Patient alert and oriented and in no cardiopulmonary distress.  HEENT: No facial asymmetry, EOMI,     Neck supple .  Chest: Clear to auscultation bilaterally.No reproducible pain  CVS: S1, S2 no murmurs, no S3.Regular rate.  ABD: Soft non tender.   Ext: No edema  MS: Adequate ROM spine, shoulders, hips and knees.  Skin: Intact, no ulcerations or rash noted.  Psych: Good eye contact, normal affect. Memory intact not anxious or depressed appearing.  CNS: CN 2-12 intact, power,  normal throughout.no focal deficits noted.   Assessment & Plan  Encounter for examination following treatment at hospital Patient in for follow up of recent ED visit Discharge summary, and laboratory and radiology data are reviewed, and any questions or concerns  are discussed. Denies any chest or abdominal pain for the last 4 days, advise him to stop the naproxen, which he had been taking once and not twice daily as  prescribed  Essential hypertension Controlled, no change in medication DASH diet and commitment to daily physical activity for a minimum of 30 minutes discussed and encouraged, as a part of hypertension management. The importance of attaining a healthy weight is also discussed.  BP/Weight 12/10/2020 12/03/2020 12/01/2020 09/01/2020 08/05/2020 07/09/2020 Q000111Q  Systolic BP 0000000 [id number] [id number] Q000111Q 0000000 0000000 123456  Diastolic BP 78 72 80 68 76 81 68  Wt. (Lbs) 165.8 - 166.8 157 170.4 168.4 163.12  BMI 23.12 - 23.26 22.21 24.1 23.82 23.07

## 2020-12-16 ENCOUNTER — Telehealth: Payer: Self-pay

## 2020-12-16 NOTE — Telephone Encounter (Signed)
Lvm that test was authorized

## 2020-12-16 NOTE — Telephone Encounter (Signed)
Stanley Jimenez from [employer]. called needs a prior authorization for CT chest lung screening that was ordered, has medicaid. Stanley Jimenez call back # [phone] ext [phone].

## 2020-12-18 ENCOUNTER — Ambulatory Visit (HOSPITAL_COMMUNITY)
Admission: RE | Admit: 2020-12-18 | Discharge: 2020-12-18 | Disposition: A | Discharge status: Home / Self Care | Payer: Medicaid Other | Source: Ambulatory Visit | Attending: Family Medicine | Admitting: Family Medicine

## 2020-12-18 ENCOUNTER — Other Ambulatory Visit: Payer: Self-pay

## 2020-12-18 DIAGNOSIS — F17210 Nicotine dependence, cigarettes, uncomplicated: Secondary | ICD-10-CM | POA: Diagnosis present

## 2020-12-18 DIAGNOSIS — Z87891 Personal history of nicotine dependence: Secondary | ICD-10-CM | POA: Diagnosis not present

## 2020-12-22 ENCOUNTER — Other Ambulatory Visit (HOSPITAL_COMMUNITY): Payer: Self-pay | Admitting: *Deleted

## 2020-12-22 DIAGNOSIS — C61 Malignant neoplasm of prostate: Secondary | ICD-10-CM

## 2020-12-22 DIAGNOSIS — C159 Malignant neoplasm of esophagus, unspecified: Secondary | ICD-10-CM

## 2020-12-30 ENCOUNTER — Other Ambulatory Visit (HOSPITAL_COMMUNITY): Payer: Self-pay

## 2020-12-30 DIAGNOSIS — C61 Malignant neoplasm of prostate: Secondary | ICD-10-CM

## 2020-12-30 DIAGNOSIS — C159 Malignant neoplasm of esophagus, unspecified: Secondary | ICD-10-CM

## 2021-01-01 ENCOUNTER — Encounter (HOSPITAL_COMMUNITY): Payer: Medicaid Other

## 2021-01-06 ENCOUNTER — Ambulatory Visit (HOSPITAL_COMMUNITY): Payer: Medicaid Other | Admitting: Hematology

## 2021-01-08 ENCOUNTER — Encounter (HOSPITAL_COMMUNITY)
Admission: RE | Admit: 2021-01-08 | Discharge: 2021-01-08 | Disposition: A | Discharge status: Home / Self Care | Payer: Medicaid Other | Source: Ambulatory Visit | Attending: Hematology | Admitting: Hematology

## 2021-01-08 ENCOUNTER — Encounter (HOSPITAL_COMMUNITY): Payer: Self-pay

## 2021-01-08 ENCOUNTER — Other Ambulatory Visit: Payer: Self-pay

## 2021-01-08 DIAGNOSIS — C159 Malignant neoplasm of esophagus, unspecified: Secondary | ICD-10-CM | POA: Insufficient documentation

## 2021-01-08 DIAGNOSIS — C61 Malignant neoplasm of prostate: Secondary | ICD-10-CM | POA: Diagnosis not present

## 2021-01-08 MED ORDER — TECHNETIUM TC 99M MEDRONATE IV KIT
20.0000 | PACK | Freq: Once | INTRAVENOUS | Status: AC | PRN
  Administered 2021-01-08: 21 via INTRAVENOUS

## 2021-01-14 ENCOUNTER — Other Ambulatory Visit: Payer: Self-pay | Admitting: Family Medicine

## 2021-01-16 ENCOUNTER — Other Ambulatory Visit: Payer: Self-pay

## 2021-01-16 ENCOUNTER — Ambulatory Visit (HOSPITAL_COMMUNITY)
Admission: RE | Admit: 2021-01-16 | Discharge: 2021-01-16 | Disposition: A | Discharge status: Home / Self Care | Payer: Medicaid Other | Source: Ambulatory Visit | Attending: Hematology | Admitting: Hematology

## 2021-01-16 ENCOUNTER — Inpatient Hospital Stay (HOSPITAL_COMMUNITY): Payer: Medicaid Other | Attending: Hematology

## 2021-01-16 DIAGNOSIS — C159 Malignant neoplasm of esophagus, unspecified: Secondary | ICD-10-CM

## 2021-01-16 DIAGNOSIS — K7030 Alcoholic cirrhosis of liver without ascites: Secondary | ICD-10-CM | POA: Insufficient documentation

## 2021-01-16 DIAGNOSIS — Z79899 Other long term (current) drug therapy: Secondary | ICD-10-CM | POA: Insufficient documentation

## 2021-01-16 DIAGNOSIS — K769 Liver disease, unspecified: Secondary | ICD-10-CM | POA: Diagnosis not present

## 2021-01-16 DIAGNOSIS — C787 Secondary malignant neoplasm of liver and intrahepatic bile duct: Secondary | ICD-10-CM | POA: Diagnosis not present

## 2021-01-16 DIAGNOSIS — K6389 Other specified diseases of intestine: Secondary | ICD-10-CM | POA: Diagnosis not present

## 2021-01-16 DIAGNOSIS — F17210 Nicotine dependence, cigarettes, uncomplicated: Secondary | ICD-10-CM | POA: Diagnosis not present

## 2021-01-16 DIAGNOSIS — Z452 Encounter for adjustment and management of vascular access device: Secondary | ICD-10-CM | POA: Diagnosis not present

## 2021-01-16 DIAGNOSIS — D649 Anemia, unspecified: Secondary | ICD-10-CM | POA: Diagnosis not present

## 2021-01-16 DIAGNOSIS — F1011 Alcohol abuse, in remission: Secondary | ICD-10-CM | POA: Diagnosis not present

## 2021-01-16 DIAGNOSIS — C7951 Secondary malignant neoplasm of bone: Secondary | ICD-10-CM | POA: Diagnosis not present

## 2021-01-16 DIAGNOSIS — C772 Secondary and unspecified malignant neoplasm of intra-abdominal lymph nodes: Secondary | ICD-10-CM | POA: Diagnosis not present

## 2021-01-16 DIAGNOSIS — C155 Malignant neoplasm of lower third of esophagus: Secondary | ICD-10-CM | POA: Diagnosis not present

## 2021-01-16 LAB — COMPREHENSIVE METABOLIC PANEL
ALT: 27 U/L (ref 0–44)
AST: 29 U/L (ref 15–41)
Albumin: 3.7 g/dL (ref 3.5–5.0)
Alkaline Phosphatase: 135 U/L — ABNORMAL HIGH (ref 38–126)
Anion gap: 7 (ref 5–15)
BUN: 20 mg/dL (ref 8–23)
CO2: 25 mmol/L (ref 22–32)
Calcium: 9.3 mg/dL (ref 8.9–10.3)
Chloride: 103 mmol/L (ref 98–111)
Creatinine, Ser: 0.94 mg/dL (ref 0.61–1.24)
GFR, Estimated: >60 mL/min (ref >60)
Glucose, Bld: 94 mg/dL (ref 70–99)
Potassium: 4.1 mmol/L (ref 3.5–5.1)
Sodium: 135 mmol/L (ref 135–145)
Total Bilirubin: 0.4 mg/dL (ref 0.3–1.2)
Total Protein: 7.8 g/dL (ref 6.5–8.1)

## 2021-01-16 LAB — CBC WITH DIFFERENTIAL/PLATELET
Abs Immature Granulocytes: 0.02 10*3/uL (ref 0.00–0.07)
Basophils Absolute: 0.1 10*3/uL (ref 0.0–0.1)
Basophils Relative: 1 %
Eosinophils Absolute: 0.6 10*3/uL — ABNORMAL HIGH (ref 0.0–0.5)
Eosinophils Relative: 7 %
HCT: 34 % — ABNORMAL LOW (ref 39.0–52.0)
Hemoglobin: 11 g/dL — ABNORMAL LOW (ref 13.0–17.0)
Immature Granulocytes: 0 %
Lymphocytes Relative: 20 %
Lymphs Abs: 1.9 10*3/uL (ref 0.7–4.0)
MCH: 28.1 pg (ref 26.0–34.0)
MCHC: 32.4 g/dL (ref 30.0–36.0)
MCV: 86.7 fL (ref 80.0–100.0)
Monocytes Absolute: 1 10*3/uL (ref 0.1–1.0)
Monocytes Relative: 11 %
Neutro Abs: 5.6 10*3/uL (ref 1.7–7.7)
Neutrophils Relative %: 61 %
Platelets: 307 10*3/uL (ref 150–400)
RBC: 3.92 MIL/uL — ABNORMAL LOW (ref 4.22–5.81)
RDW: 15.2 % (ref 11.5–15.5)
WBC: 9.1 10*3/uL (ref 4.0–10.5)
nRBC: 0 % (ref 0.0–0.2)

## 2021-01-16 MED ORDER — IOHEXOL 350 MG/ML SOLN
80.0000 mL | Freq: Once | INTRAVENOUS | Status: AC | PRN
  Administered 2021-01-16: 80 mL via INTRAVENOUS

## 2021-01-20 NOTE — Progress Notes (Signed)
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  Gleason 3+3 = 6 pattern.   08/14/2014 Initial Diagnosis   Malignant neoplasm of prostate [facility])    - 09/20/2014 Radiation Therapy   Brachytherapy with I-125   Malignant tumor of lower third of esophagus ([place])  04/14/2016 Procedure   EGD by Dr. Laural Jimenez.   04/16/2016 Pathology Results   Esophagus, biopsy - INVASIVE POORLY DIFFERENTIATED SQUAMOUS CELL CARCINOMA.   04/26/2016 Imaging   CT CAP- 1. Prominent gastroesophageal junction without discrete esophageal mass. Notably, there is also a borderline enlarged enhancing lymph node in the gastrohepatic ligament immediately adjacent to the gastroesophageal junction, which is nonspecific, but could indicate early nodal disease. 2. The small cluster of low-attenuation lesions in segment 7 of the liver are too small to characterize. Statistically, these are likely to represent cysts. If there is clinical concern for metastatic disease to the liver, these could be further characterized with MRI of the abdomen with and without IV gadolinium at this time. 3. No other potential sites of metastatic disease noted elsewhere in the chest, abdomen or pelvis.   05/10/2016 PET scan   Hypermetabolic bilateral lower thoracic esophageal mass, consistent with known primary esophageal carcinoma.  Small hypermetabolic  lymph node in the upper gastrohepatic ligament adjacent to GE junction, consistent with lymph node metastasis.  No other sites of metastatic disease identified.   05/31/2016 Procedure   Port placed by Dr. Rosana Jimenez   06/02/2016 - 07/14/2016 Chemotherapy   Weekly Carboplatin/Paclitaxel with XRT.  Tolerated extremely well.    06/02/2016 - 07/14/2016 Radiation Therapy   Stanley Jimenez, Stanley Jimenez.  Tolerated well.   08/23/2016 PET scan   1. Complete metabolic response. No residual hypermetabolism in the thoracic esophagus or in the upper gastrohepatic ligament lymph node, which is decreased in size. 2. Aortic atherosclerosis.  Three-vessel coronary atherosclerosis.   08/24/2016 Miscellaneous   CTS consult with Dr. Servando Jimenez- "I've recommended to him that surgical resection offers the best chance of cure, if he waits and there is recurrent disease remaining no longer be a surgical candidate. From his discussion today I think he is not inclined to agree but it has been offered."   03/09/2017 Imaging   CT CAP: 1. Mild distal esophageal and GE junction thickening, which appears slightly more prominent compared with PET-CT 08/20/2016. Correlation with PET-CT suggested. 2. Negative for pulmonary nodules or metastatic disease to the chest. 3. Stable subcentimeter posterior right hepatic lobe hypodensities. No definite evidence for metastatic disease to the abdomen or pelvis   04/28/2017 PET scan   No evidence of recurrent or metastatic carcinoma.     Jimenez STAGING: Jimenez Staging Malignant tumor of lower third of esophagus ([place]) Staging form: Esophagus - Squamous Cell Carcinoma, AJCC 8th Edition - Clinical stage from 06/15/2016: Stage Unknown (cTX, cN1, cM0, L: Lower) - Signed by Stanley Cancer, PA-C on 06/15/2016   INTERVAL HISTORY:  Stanley Jimenez, a 65  y.o. male, returns for routine follow-up of his squamous cell carcinoma of the esophagus. [name] was last seen on 08/05/2020.   Today he reports  feeling well. He reports constant sharp pain in the sides of his abdomen bilaterally for the past month which he rates 7/10; this pain is worst on on his right side and is worsened while lying down. This pain was not helped by naprosyn, and he is not currently taking any pain medication. He also reports occasional back pain. He denies n/v, tingling/headaches, ankle swellings, and vision changes. He has lost 13 pounds over the past year, and he reports good appetite. He denies history of MI or CVA. He lives at home alone, and he is able to do all of is typical home activities unassisted.Two sisters has unspecified cancers, and he otherwise denies family history of Jimenez. 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 He quit smoking 2 months ago after smoking 0.5 ppd for 48 years.  REVIEW OF SYSTEMS:  Review of Systems  Constitutional:  Positive for unexpected weight change (13 lbs). Negative for appetite change.  Eyes:  Negative for eye problems.  Cardiovascular:  Negative for leg swelling.  Gastrointestinal:  Positive for abdominal pain. Negative for nausea and vomiting.  Musculoskeletal:  Positive for back pain (R lower posterior ribs).  Neurological:  Negative for headaches.  All other systems reviewed and are negative.  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HCC)    Clubbing of fingers    congenital   Depression    Full dentures    History of PSVT (paroxysmal supraventricular tachycardia)    run of non-sustatined VT 03-10-2015 in setting of alcohol withdrawal in hospital   History of seizure    03-08-2012  alcohol withdrawal   History of thrombocytopenia    10/ 2013  in setting of alcohol withdrawal   Hyperlipidemia    Hypertension    Prostate Jimenez [facility]) urologist-  dr Stanley Jimenez/  oncologist- dr Stanley Jimenez   T1c,  Gleason 3+3,  PSA 8.89,  vol 27cc   Psoriasis, 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   Seizures ([place])    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   Squamous cell carcinoma of esophagus ([place]) 04/21/2016   Past Surgical History:  Procedure Laterality Date   BIOPSY  04/14/2016   Procedure: BIOPSY;  Surgeon: Rogene Houston, MD;  [facility];  Service: Endoscopy;;  esophagus   COLONOSCOPY N/A 04/14/2016   Procedure: COLONOSCOPY;  Surgeon: Rogene Houston, MD;  [facility];  Service: Endoscopy;  Laterality: N/A;   ESOPHAGOGASTRODUODENOSCOPY N/A 04/14/2016   Procedure: ESOPHAGOGASTRODUODENOSCOPY (EGD);  Surgeon: Rogene Houston, MD;  Location[facility];  Service: Endoscopy;  Laterality: N/A;  1:00   NO PAST SURGERIES     PORTACATH PLACEMENT Right 05/31/2016   Procedure: INSERTION OF TUNNELED RIGHT INTERNAL JUGULAR BARD POWERPORT CENTRAL VENOUS CATHETER WITH SUBCUTANEOUS PORT;  Surgeon: Vickie Epley, MD;  Location: [facility];  Service: Vascular;  Laterality: Right;   RADIOACTIVE SEED IMPLANT N/A 09/20/2014   Procedure: RADIOACTIVE SEED IMPLANT;  Surgeon: Franchot Gallo, MD;  Location[facility];  Service: Urology;  Laterality: N/A;    79   seeds implanted no seeds found in bladder   TRANSURETHRAL RESECTION OF PROSTATE  SOCIAL HISTORY:  Social History   Socioeconomic History   Marital status: Single    Spouse name: Not on file   Number of children: Not on file   Years of education: Not on file   Highest education level: Not on file  Occupational History   Not on file  Tobacco Use   Smoking status: Former    Packs/day: 0.50    Years: 20.00    Pack years: 10.00    Types: Cigarettes    Quit date: 11/12/2020    Years since quitting: 0.1   Smokeless tobacco: Former    Types: Chew    Quit date: 05/27/1984   Tobacco comments:    only smoking a few  Vaping Use   Vaping Use: Never used  Substance and Sexual Activity   Alcohol use:  Not on file    Comment: quit drinking in October 2015. He says before this he says he stayed drunk every day.    Drug use: No    Comment: last use october 2015   Sexual activity: Never    Birth control/protection: None  Other Topics Concern   Not on file  Social History Narrative   Not on file   Social Determinants of Health   Financial Resource Strain: Not on file  Food Insecurity: Not on file  Transportation Needs: Not on file  Physical Activity: Not on file  Stress: Not on file  Social Connections: Not on file  Intimate Partner Violence: Not on file    FAMILY HISTORY:  Family History  Problem Relation Age of Onset   Alcohol abuse Mother    Diabetes Sister     CURRENT MEDICATIONS:  Current Outpatient Medications  Medication Sig Dispense Refill   amLODipine (NORVASC) 10 MG tablet TAKE 1 TABLET BY MOUTH DAILY 90 tablet 1   aspirin EC 81 MG tablet Take 1 tablet (81 mg total) by mouth daily. 90 tablet 3   buPROPion (WELLBUTRIN XL) 150 MG 24 hr tablet TAKE 1 TABLET(150 MG) BY MOUTH DAILY 30 tablet 2   rosuvastatin (CRESTOR) 20 MG tablet TAKE 1 TABLET(20 MG) BY MOUTH DAILY 30 tablet 3   tamsulosin (FLOMAX) 0.4 MG CAPS capsule TAKE 1 CAPSULE(0.4 MG) BY MOUTH DAILY 90 capsule 1   triamcinolone (KENALOG) 0.1 % APPLY TO THE AFFECTED AREA TWICE DAILY 454 g 0   No current facility-administered medications for this visit.    ALLERGIES:  No Known Allergies  PHYSICAL EXAM:  Performance status (ECOG): 1 - Symptomatic but completely ambulatory  Vitals:   01/21/21 1359  BP: 134/68  Pulse: 94  Resp: 17  Temp: (!) 96.8 F (36 C)  SpO2: 100%   Wt Readings from Last 3 Encounters:  01/21/21 157 lb 14.4 oz (71.6 kg)  12/10/20 165 lb 12.8 oz (75.2 kg)  12/01/20 166 lb 12.8 oz (75.7 kg)   Physical Exam Vitals reviewed.  Constitutional:      Appearance: Normal appearance.  Cardiovascular:     Rate and Rhythm: Normal rate and regular rhythm.     Pulses: Normal pulses.      Heart sounds: Normal heart sounds.  Pulmonary:     Effort: Pulmonary effort is normal.     Breath sounds: Normal breath sounds.  Skin:    Nails: There is clubbing.  Neurological:     General: No focal deficit present.     Mental Status: He is alert and oriented to person, place, and time.  Psychiatric:  Mood and Affect: Mood normal.        Behavior: Behavior normal.     LABORATORY DATA:  I have reviewed the labs as listed.  CBC Latest Ref Rng & Units 01/16/2021 11/21/2020 07/30/2020  WBC 4.0 - 10.5 K/uL 9.1 9.3 9.4  Hemoglobin 13.0 - 17.0 g/dL 11.0(L) 12.1(L) 13.3  Hematocrit 39.0 - 52.0 % 34.0(L) 37.6 40.7  Platelets 150 - 400 K/uL 307 250 207   CMP Latest Ref Rng & Units 01/16/2021 11/21/2020 07/30/2020  Glucose 70 - 99 mg/dL 94 93 91  BUN 8 - 23 mg/dL '20 17 16  '$ Creatinine 0.61 - 1.24 mg/dL 0.94 1.17 1.03  Sodium 135 - 145 mmol/L 135 140 135  Potassium 3.5 - 5.1 mmol/L 4.1 4.7 3.7  Chloride 98 - 111 mmol/L 103 104 102  CO2 22 - 32 mmol/L '25 20 24  '$ Calcium 8.9 - 10.3 mg/dL 9.3 9.3 8.7(L)  Total Protein 6.5 - 8.1 g/dL 7.8 6.9 7.4  Total Bilirubin 0.3 - 1.2 mg/dL 0.4 0.5 0.7  Alkaline Phos 38 - 126 U/L 135(H) 126(H) 78  AST 15 - 41 U/L '29 23 23  '$ ALT 0 - 44 U/L 27 24 34    DIAGNOSTIC IMAGING:  I have independently reviewed the scans and discussed with the patient. NM Bone Scan Whole Body  Result Date: 01/09/2021 CLINICAL DATA:  Prostate Jimenez, gastrointestinal Jimenez EXAM: NUCLEAR MEDICINE WHOLE BODY BONE SCAN TECHNIQUE: Whole body anterior and posterior images were obtained approximately 3 hours after intravenous injection of radiopharmaceutical. RADIOPHARMACEUTICALS:  21.0 mCi Technetium-71mMDP IV COMPARISON:  12/18/2020, 07/30/2020 FINDINGS: Anterior and posterior whole body planar images are obtained. There is physiologic excretion of radiotracer within the kidneys and bladder. Multiple foci of increased radiotracer activity are seen within the thoracic cage, corresponding  to the lytic rib lesions seen on recent CT chest 12/18/2020. Areas of most intense activity are seen within the right anterior second rib, right posterolateral seventh rib, and left anterolateral fifth rib. There is also intense focal activity within the left aspect of the sternal manubrium. Focal areas of activity are also seen within the bilateral shoulders, suspicious for metastatic disease given a lytic lesion within the left glenoid seen on recent CT. There is a mottled appearance of the thoracic spine, without distinct increased radiotracer activity to correspond to the lytic lesions seen on recent CT. No other areas of abnormal radiotracer uptake. IMPRESSION: 1. Multifocal abnormal radiotracer uptake throughout the thoracic cage, bilateral shoulders, and sternum consistent with bony metastases. 2. The lytic lesions seen within the thoracic spine on recent chest CT do not have clear correlate abnormalities on this exam, though activity throughout the thoracic spine is somewhat mottled. Electronically Signed   By: MRanda NgoM.D.   On: 01/09/2021 22:03   CT Abdomen Pelvis W Contrast  Result Date: 01/19/2021 CLINICAL DATA:  65year old male with history of squamous cell carcinoma of the esophagus. Follow-up study. EXAM: CT ABDOMEN AND PELVIS WITH CONTRAST TECHNIQUE: Multidetector CT imaging of the abdomen and pelvis was performed using the standard protocol following bolus administration of intravenous contrast. CONTRAST:  875mOMNIPAQUE IOHEXOL 350 MG/ML SOLN COMPARISON:  CT the chest, abdomen and pelvis 07/30/2020. FINDINGS: Lower chest: Minimal fullness of the distal esophagus at the gastroesophageal junction, without discrete esophageal mass confidently identified on today's examination. Atherosclerotic calcifications in the descending thoracic aorta as well as the left circumflex and right coronary arteries. Calcifications of the aortic valve. Central venous catheter tip terminating in the  right  atrium. Hepatobiliary: Compared to the prior study, there are now numerous poorly defined hypovascular lesions scattered throughout the hepatic parenchyma, indicative of widespread metastatic disease to the liver, largest of which is in segment 2 (axial image 15 of series 2) measuring 3.8 x 2.3 cm. No intra or extrahepatic biliary ductal dilatation. Gallbladder is nearly completely decompressed, but otherwise unremarkable in appearance. Pancreas: No pancreatic mass. No pancreatic ductal dilatation. No pancreatic or peripancreatic fluid collections or inflammatory changes. Spleen: Unremarkable. Adrenals/Urinary Tract: There are multiple subcentimeter low-attenuation lesions scattered throughout the kidneys bilaterally, too small to characterize, but statistically likely to represent tiny cysts. No aggressive renal lesions are noted. Bilateral adrenal glands are normal in appearance. No hydroureteronephrosis. Urinary bladder is normal in appearance. Stomach/Bowel: Mild subjective thickening at the gastroesophageal junction without clearly definable mass confidently identified on today's examination. Or distal aspects of the stomach are otherwise normal in appearance. No pathologic dilatation of small bowel or colon. The appendix is not confidently identified and may be surgically absent. Regardless, there are no inflammatory changes noted adjacent to the cecum to suggest the presence of an acute appendicitis at this time. Vascular/Lymphatic: Aortic atherosclerosis, without evidence of aneurysm or dissection in the abdominal or pelvic vasculature. Numerous enlarged lymph nodes are now noted in the upper abdomen and retroperitoneum, most conspicuous of which is a bulky conglomerate nodal mass intimately associated with the celiac axis extending partially into the porta hepatis immediately cephalad to the proximal pancreatic body (axial image 21 of series 2 and coronal image 47 of series 5) estimated to measure  approximately 4.3 x 3.0 x 3.0 cm. This appears to cause some mild narrowing of adjacent vascular structures, most notably the common hepatic artery and proximal splenic artery, both of which appear patent at this time. Additional retroperitoneal lymphadenopathy is noted, measuring up to 1.2 cm in short axis in the left para-aortic nodal station adjacent to the left renal hilum (axial image 28 of series 2). Reproductive: Brachytherapy implants throughout the prostate gland. Seminal vesicles are unremarkable in appearance. Other: No significant volume of ascites.  No pneumoperitoneum. Musculoskeletal: Widespread lytic lesions are now noted throughout the visualized axial and appendicular skeleton, indicative of widespread metastatic disease to the bones IMPRESSION: 1. Mild subjective thickening at the gastroesophageal junction without a well-defined mass. However, there is clear evidence of progression of disease, including extensive hepatic metastases, upper abdominal and retroperitoneal lymphadenopathy, and widespread metastatic disease to the bones, as detailed above. 2. Aortic atherosclerosis, in addition to least 2 vessel coronary artery disease. Please note that although the presence of coronary artery calcium documents the presence of coronary artery disease, the severity of this disease and any potential stenosis cannot be assessed on this non-gated CT examination. Assessment for potential risk factor modification, dietary therapy or pharmacologic therapy may be warranted, if clinically indicated. 3. There are calcifications of the aortic valve. Echocardiographic correlation for evaluation of potential valvular dysfunction may be warranted if clinically indicated. 4. Additional incidental findings, as above. Electronically Signed   By: Vinnie Langton M.D.   On: 01/19/2021 08:35     ASSESSMENT:  1.  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  Stable small low-density lesions in the right hepatic lobe benign.   2.  Social/family history: - He  lives at home by himself and is independent of ADLs and IADLs.  He worked in [employer] in the past.  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  His sister lives close by. - He smoked half pack per day for 48 years and quit around 2020/12/07. - 2 of his sisters died of Jimenez.  Patient does not know the type.   PLAN:  1.  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 - I have reviewed images of the CT AP with contrast from 01/16/2021 which showed mild subjective thickening at the GE junction without defined mass.  There is extensive hepatic metastasis, upper abdominal and retroperitoneal adenopathy and widespread metastatic disease to the bones. - We also reviewed bone scan images from 01/08/2021 which showed multifocal abnormal radiotracer uptake in the thoracic cage, bilateral shoulders, sternum. - I have recommended CT-guided biopsy of the liver lesions. - We will also check CEA level. - Return to clinic after biopsy.  We will consider sending NGS testing.   2.  Tobacco abuse: - He quit smoking around 2020-12-07.  3.  Chest wall and back pain: - He reported right lateral rib pain worsening on lying down and left anterior chest wall pain also worse on lying down.  Rated as 6-7 over 10.  Sharp pain.  He was given Naprosyn in the ER which did not help much.  He also reported occasional back pain. - We will start him on hydrocodone 5 mg every 8 hours as needed.  4.  Weight loss: - He reports 13 pound weight loss in the last  1 year.  However his appetite is good and he is eating well. - He does not report any nausea vomiting or regurgitation.  We will closely monitor.  5.  Normocytic anemia: - Latest labs on 01/16/2021 shows hemoglobin down to 11 with MCV 86.  White count and platelets were normal. - Recommend checking ferritin, iron panel, 123456, folic acid, copper levels today.  We will also check SPEP, free light chains, immunofixation and LDH.   Orders placed this encounter:  Orders Placed This Encounter  Procedures   CT US GUIDED BIOPSY   Total time spent is 30 minutes with more than 50% of the time spent face-to-face discussing and reviewing scans, further treatment plan, counseling and coordination of care.   Derek Jack, MD [place] [phone]   I, Thana Ates, am acting as a scribe for Dr. Derek Jack.  I, Derek Jack MD, have reviewed the above documentation for accuracy and completeness, and I agree with the above.

## 2021-01-21 ENCOUNTER — Encounter (HOSPITAL_COMMUNITY): Payer: Self-pay | Admitting: Hematology

## 2021-01-21 ENCOUNTER — Inpatient Hospital Stay (HOSPITAL_BASED_OUTPATIENT_CLINIC_OR_DEPARTMENT_OTHER): Payer: Medicaid Other | Admitting: Hematology

## 2021-01-21 ENCOUNTER — Inpatient Hospital Stay (HOSPITAL_COMMUNITY): Payer: Medicaid Other

## 2021-01-21 ENCOUNTER — Other Ambulatory Visit: Payer: Self-pay

## 2021-01-21 VITALS — BP 134/68 | HR 94 | Temp 96.8°F | Resp 17 | Wt 157.9 lb

## 2021-01-21 DIAGNOSIS — K769 Liver disease, unspecified: Secondary | ICD-10-CM

## 2021-01-21 DIAGNOSIS — C155 Malignant neoplasm of lower third of esophagus: Secondary | ICD-10-CM | POA: Diagnosis not present

## 2021-01-21 DIAGNOSIS — C159 Malignant neoplasm of esophagus, unspecified: Secondary | ICD-10-CM

## 2021-01-21 LAB — IRON AND TIBC
Iron: 20 ug/dL — ABNORMAL LOW (ref 45–182)
Saturation Ratios: 8 % — ABNORMAL LOW (ref 17.9–39.5)
TIBC: 256 ug/dL (ref 250–450)
UIBC: 236 ug/dL

## 2021-01-21 LAB — LACTATE DEHYDROGENASE: LDH: 250 U/L — ABNORMAL HIGH (ref 98–192)

## 2021-01-21 LAB — FOLATE: Folate: 10.4 ng/mL (ref >5.9)

## 2021-01-21 LAB — VITAMIN B12: Vitamin B-12: 4147 pg/mL — ABNORMAL HIGH (ref 180–914)

## 2021-01-21 LAB — FERRITIN: Ferritin: 592 ng/mL — ABNORMAL HIGH (ref 24–336)

## 2021-01-21 MED ORDER — HYDROCODONE-ACETAMINOPHEN 5-325 MG PO TABS: 1.0000 | ORAL_TABLET | Freq: Three times a day (TID) | ORAL | 0 refills | Status: DC | PRN

## 2021-01-21 NOTE — Patient Instructions (Addendum)
[place] at [facility] Discharge Instructions  You were seen today by Dr. Delton Coombes. He went over your recent results and scans. You have been prescribed Hydrocodone to be take 3 times a day as needed for pain. You will be scheduled for a biopsy of your liver. Dr. Delton Coombes will see you back in after your biopsy for labs and follow up.   Thank you for choosing [place] at [facility] to provide your oncology and hematology care.  To afford each patient quality time with our provider, please arrive at least 15 minutes before your scheduled appointment time.   If you have a lab appointment with the [facility] please come in thru the Main Entrance and check in at the main information desk  You need to re-schedule your appointment should you arrive 10 or more minutes late.  We strive to give you quality time with our providers, and arriving late affects you and other patients whose appointments are after yours.  Also, if you no show three or more times for appointments you may be dismissed from the clinic at the providers discretion.     Again, thank you for choosing [facility].  Our hope is that these requests will decrease the amount of time that you wait before being seen by our physicians.       _____________________________________________________________  Should you have questions after your visit to [facility], please contact our office at [phone] between the hours of 8:00 a.m. and 4:30 p.m.  Voicemails left after 4:00 p.m. will not be returned until the following business day.  For prescription refill requests, have your pharmacy contact our office and allow 72 hours.    Cancer Center Support Programs:   > Cancer Support Group  2nd Tuesday of the month 1pm-2pm, Journey Room

## 2021-01-22 LAB — KAPPA/LAMBDA LIGHT CHAINS
Kappa free light chain: 64.3 mg/L — ABNORMAL HIGH (ref 3.3–19.4)
Kappa, lambda light chain ratio: 1.97 — ABNORMAL HIGH (ref 0.26–1.65)
Lambda free light chains: 32.6 mg/L — ABNORMAL HIGH (ref 5.7–26.3)

## 2021-01-22 LAB — CEA: CEA: 296 ng/mL — ABNORMAL HIGH (ref 0.0–4.7)

## 2021-01-22 LAB — COPPER, SERUM: Copper: 156 ug/dL — ABNORMAL HIGH (ref 69–132)

## 2021-01-27 LAB — PROTEIN ELECTROPHORESIS, SERUM
A/G Ratio: 0.8 (ref 0.7–1.7)
Albumin ELP: 3.3 g/dL (ref 2.9–4.4)
Alpha-1-Globulin: 0.4 g/dL (ref 0.0–0.4)
Alpha-2-Globulin: 0.9 g/dL (ref 0.4–1.0)
Beta Globulin: 1.1 g/dL (ref 0.7–1.3)
Gamma Globulin: 1.5 g/dL (ref 0.4–1.8)
Globulin, Total: 3.9 g/dL (ref 2.2–3.9)
Total Protein ELP: 7.2 g/dL (ref 6.0–8.5)

## 2021-01-28 ENCOUNTER — Encounter (HOSPITAL_COMMUNITY): Payer: Self-pay | Admitting: Radiology

## 2021-01-28 NOTE — Progress Notes (Signed)
Patient Name  Stanley Jimenez, Stanley Jimenez Legal Sex  Male DOB  08/03/1955 SSN  [id number] Address  [address]  [place] [address] Phone  [phone] [facility])  [phone] (Mobile)    RE: CT BIOPSY Received: Today Erick Alley, MD; Garth Bigness D      Previous Messages   ----- Message -----  From: Arne Cleveland, MD  Sent: 01/21/2021   4:51 PM EDT  To: Valli Glance  Subject: RE: CT BIOPSY                                   Ok   [place] core liver lesion bx   DDH     ----- Message -----  From: Valli Glance  Sent: 01/21/2021   4:27 PM EDT  To: Jillyn Hidden, [place] Procedure Requests  Subject: CT BIOPSY                                       Procedure: CT BIOPSY   Reason: Squamous cell carcinoma of esophagus, History of esophageal cancer in 2018, newly noted liver lesions on recent CT scan     Hx: CT and Nuc Med Bone in Computer   Provider: [place], [place]: [phone]

## 2021-02-03 ENCOUNTER — Other Ambulatory Visit: Payer: Self-pay | Admitting: Family Medicine

## 2021-02-05 ENCOUNTER — Ambulatory Visit (HOSPITAL_COMMUNITY): Payer: Medicaid Other

## 2021-02-05 ENCOUNTER — Other Ambulatory Visit: Payer: Self-pay | Admitting: Student

## 2021-02-05 ENCOUNTER — Other Ambulatory Visit: Payer: Self-pay | Admitting: Radiology

## 2021-02-05 ENCOUNTER — Other Ambulatory Visit (HOSPITAL_COMMUNITY): Payer: Medicaid Other

## 2021-02-06 ENCOUNTER — Ambulatory Visit (HOSPITAL_COMMUNITY)
Admission: RE | Admit: 2021-02-06 | Discharge: 2021-02-06 | Disposition: A | Discharge status: Home / Self Care | Payer: Medicaid Other | Source: Ambulatory Visit | Attending: Hematology | Admitting: Hematology

## 2021-02-06 ENCOUNTER — Encounter (HOSPITAL_COMMUNITY): Payer: Self-pay

## 2021-02-06 DIAGNOSIS — K7030 Alcoholic cirrhosis of liver without ascites: Secondary | ICD-10-CM | POA: Insufficient documentation

## 2021-02-06 DIAGNOSIS — Z79899 Other long term (current) drug therapy: Secondary | ICD-10-CM | POA: Diagnosis not present

## 2021-02-06 DIAGNOSIS — C159 Malignant neoplasm of esophagus, unspecified: Secondary | ICD-10-CM | POA: Diagnosis not present

## 2021-02-06 DIAGNOSIS — I2510 Atherosclerotic heart disease of native coronary artery without angina pectoris: Secondary | ICD-10-CM | POA: Diagnosis not present

## 2021-02-06 DIAGNOSIS — Z87892 Personal history of anaphylaxis: Secondary | ICD-10-CM | POA: Diagnosis not present

## 2021-02-06 DIAGNOSIS — I10 Essential (primary) hypertension: Secondary | ICD-10-CM | POA: Insufficient documentation

## 2021-02-06 DIAGNOSIS — F32A Depression, unspecified: Secondary | ICD-10-CM | POA: Diagnosis not present

## 2021-02-06 DIAGNOSIS — R160 Hepatomegaly, not elsewhere classified: Secondary | ICD-10-CM | POA: Diagnosis not present

## 2021-02-06 DIAGNOSIS — C61 Malignant neoplasm of prostate: Secondary | ICD-10-CM | POA: Insufficient documentation

## 2021-02-06 DIAGNOSIS — Z7982 Long term (current) use of aspirin: Secondary | ICD-10-CM | POA: Insufficient documentation

## 2021-02-06 DIAGNOSIS — E785 Hyperlipidemia, unspecified: Secondary | ICD-10-CM | POA: Insufficient documentation

## 2021-02-06 DIAGNOSIS — I471 Supraventricular tachycardia: Secondary | ICD-10-CM | POA: Diagnosis not present

## 2021-02-06 DIAGNOSIS — D696 Thrombocytopenia, unspecified: Secondary | ICD-10-CM | POA: Insufficient documentation

## 2021-02-06 DIAGNOSIS — C787 Secondary malignant neoplasm of liver and intrahepatic bile duct: Secondary | ICD-10-CM | POA: Diagnosis not present

## 2021-02-06 DIAGNOSIS — I700 Atherosclerosis of aorta: Secondary | ICD-10-CM | POA: Diagnosis not present

## 2021-02-06 DIAGNOSIS — K769 Liver disease, unspecified: Secondary | ICD-10-CM | POA: Diagnosis present

## 2021-02-06 LAB — CBC
HCT: 38.8 % — ABNORMAL LOW (ref 39.0–52.0)
Hemoglobin: 12.6 g/dL — ABNORMAL LOW (ref 13.0–17.0)
MCH: 27 pg (ref 26.0–34.0)
MCHC: 32.5 g/dL (ref 30.0–36.0)
MCV: 83.3 fL (ref 80.0–100.0)
Platelets: 292 10*3/uL (ref 150–400)
RBC: 4.66 MIL/uL (ref 4.22–5.81)
RDW: 15.7 % — ABNORMAL HIGH (ref 11.5–15.5)
WBC: 7.5 10*3/uL (ref 4.0–10.5)
nRBC: 0 % (ref 0.0–0.2)

## 2021-02-06 LAB — PROTIME-INR
INR: 1.2 (ref 0.8–1.2)
Prothrombin Time: 15.3 seconds — ABNORMAL HIGH (ref 11.4–15.2)

## 2021-02-06 MED ORDER — SODIUM CHLORIDE 0.9 % IV SOLN: INTRAVENOUS | Status: DC

## 2021-02-06 MED ORDER — GELATIN ABSORBABLE 12-7 MM EX MISC
CUTANEOUS | Status: AC
  Filled 2021-02-06: qty 1

## 2021-02-06 MED ORDER — MIDAZOLAM HCL 2 MG/2ML IJ SOLN
INTRAMUSCULAR | Status: AC
  Filled 2021-02-06: qty 4

## 2021-02-06 MED ORDER — LIDOCAINE HCL (PF) 1 % IJ SOLN
INTRAMUSCULAR | Status: AC
  Filled 2021-02-06: qty 30

## 2021-02-06 MED ORDER — MIDAZOLAM HCL 2 MG/2ML IJ SOLN
INTRAMUSCULAR | Status: DC | PRN
  Administered 2021-02-06 (×2): 1 mg via INTRAVENOUS

## 2021-02-06 MED ORDER — FENTANYL CITRATE (PF) 100 MCG/2ML IJ SOLN
INTRAMUSCULAR | Status: AC
  Filled 2021-02-06: qty 4

## 2021-02-06 MED ORDER — FENTANYL CITRATE (PF) 100 MCG/2ML IJ SOLN
INTRAMUSCULAR | Status: DC | PRN
  Administered 2021-02-06 (×2): 50 ug via INTRAVENOUS

## 2021-02-06 NOTE — Sedation Documentation (Signed)
Pt taken to short stay on stretcher for recovery.

## 2021-02-06 NOTE — Procedures (Signed)
Interventional Radiology Procedure Note  Procedure: US Guided Biopsy of right lobe liver lesion  Complications: None  Estimated Blood Loss: < 10 mL  Findings: 18 G core biopsy of right lobe liver mass performed under US guidance.  Three core samples obtained and sent to Pathology.  [name]. Kathlene Cote, M.D Pager:  [phone]

## 2021-02-06 NOTE — H&P (Signed)
Chief Complaint: Patient was seen in consultation today for liver lesion biopsy.   at the request of Katragadda,Sreedhar  Referring Physician(s): Derek Jack  Supervising Physician: Aletta Edouard  Patient Status: [facility] - Out-pt  History of Present Illness: Stanley Jimenez is a 65 y.o. male with PMH of alcoholic cirrhosis, depression, PSVT, thrombocytopenia, HLD, HTN, prostate cancer, and esophageal cancer.  Patient had CT abdomen on 01/19/2021 that noted new liver lesions.  Dr. Delton Coombes referred patient today for liver lesion biopsy. Liver lesion biopsy was approved by Dr. Vernard Gambles, IR.  IMPRESSION: 1. Mild subjective thickening at the gastroesophageal junction without a well-defined mass. However, there is clear evidence of progression of disease, including extensive hepatic metastases, upper abdominal and retroperitoneal lymphadenopathy, and widespread metastatic disease to the bones, as detailed above. 2. Aortic atherosclerosis, in addition to least 2 vessel coronary artery disease. Please note that although the presence of coronary artery calcium documents the presence of coronary artery disease, the severity of this disease and any potential stenosis cannot be assessed on this non-gated CT examination. Assessment for potential risk factor modification, dietary therapy or pharmacologic therapy may be warranted, if clinically indicated. 3. There are calcifications of the aortic valve. Echocardiographic correlation for evaluation of potential valvular dysfunction may be warranted if clinically indicated. Hepatobiliary: Compared to the prior study, there are now numerous poorly defined hypovascular lesions scattered throughout the hepatic parenchyma, indicative of widespread metastatic disease to the liver, largest of which is in segment 2 (axial image 15 of series 2) measuring 3.8 x 2.3 cm. No intra or extrahepatic biliary ductal dilatation. Gallbladder is nearly  completely decompressed, but otherwise unremarkable in appearance.   Past Medical History:  Diagnosis Date   Alcohol abuse, in remission    SINCE 10- 2015   Anxiety    Arthritis    Cirrhosis, alcoholic (HCC)    Clubbing of fingers    congenital   Depression    Full dentures    History of PSVT (paroxysmal supraventricular tachycardia)    run of non-sustatined VT 03-10-2015 in setting of alcohol withdrawal in hospital   History of seizure    03-08-2012  alcohol withdrawal   History of thrombocytopenia    10/ 2013  in setting of alcohol withdrawal   Hyperlipidemia    Hypertension    Prostate cancer [facility]) urologist-  dr dalhstedt/  oncologist- dr Tammi Klippel   T1c, Gleason 3+3,  PSA 8.89,  vol 27cc   Psoriasis, 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   Seizures ([place])    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    Past Surgical History:  Procedure Laterality Date   BIOPSY  04/14/2016   Procedure: BIOPSY;  Surgeon: Rogene Houston, MD;  Location: [facility];  Service: Endoscopy;;  esophagus   COLONOSCOPY N/A 04/14/2016   Procedure: COLONOSCOPY;  Surgeon: Rogene Houston, MD;  Location: [facility];  Service: Endoscopy;  Laterality: N/A;   ESOPHAGOGASTRODUODENOSCOPY N/A 04/14/2016   Procedure: ESOPHAGOGASTRODUODENOSCOPY (EGD);  Surgeon: Rogene Houston, MD;  Location: [facility];  Service: Endoscopy;  Laterality: N/A;  1:00   NO PAST SURGERIES     PORTACATH PLACEMENT Right 05/31/2016   Procedure: INSERTION OF TUNNELED RIGHT INTERNAL JUGULAR BARD POWERPORT CENTRAL VENOUS CATHETER WITH SUBCUTANEOUS PORT;  Surgeon: Vickie Epley, MD;  Location: [facility];  Service: Vascular;  Laterality: Right;   RADIOACTIVE SEED IMPLANT N/A 09/20/2014   Procedure: RADIOACTIVE SEED IMPLANT;  Surgeon: Franchot Gallo, MD;  Location: [facility];  Service: Urology;  Laterality: N/A;    69   seeds implanted no seeds found in  bladder   TRANSURETHRAL RESECTION OF PROSTATE      Allergies: Patient has no known allergies.  Medications: Prior to Admission medications   Medication Sig Start Date End Date Taking? Authorizing Provider  aspirin EC 81 MG tablet Take 1 tablet (81 mg total) by mouth daily. 10/02/19  Yes Wellington Hampshire, MD  buPROPion (WELLBUTRIN XL) 150 MG 24 hr tablet TAKE 1 TABLET(150 MG) BY MOUTH DAILY 01/15/21  Yes [name], MD  HYDROcodone-acetaminophen (NORCO) 5-325 MG tablet Take 1 tablet by mouth every 8 (eight) hours as needed for moderate pain. 01/21/21  Yes Derek Jack, MD  rosuvastatin (CRESTOR) 20 MG tablet TAKE 1 TABLET(20 MG) BY MOUTH DAILY 10/28/20  Yes [name], MD  tamsulosin (FLOMAX) 0.4 MG CAPS capsule TAKE 1 CAPSULE(0.4 MG) BY MOUTH DAILY 11/27/20  Yes [name], MD  triamcinolone (KENALOG) 0.1 % APPLY TO THE AFFECTED AREA TWICE DAILY 07/07/20  Yes [name], MD  amLODipine (NORVASC) 10 MG tablet TAKE 1 TABLET BY MOUTH DAILY 02/03/21   [name], MD     Family History  Problem Relation Age of Onset   Alcohol abuse Mother    Diabetes Sister     Social History   Socioeconomic History   Marital status: Single    Spouse name: Not on file   Number of children: Not on file   Years of education: Not on file   Highest education level: Not on file  Occupational History   Not on file  Tobacco Use   Smoking status: Former    Packs/day: 0.50    Years: 20.00    Pack years: 10.00    Types: Cigarettes    Quit date: 11/12/2020    Years since quitting: 0.2   Smokeless tobacco: Former    Types: Chew    Quit date: 05/27/1984   Tobacco comments:    only smoking a few  Vaping Use   Vaping Use: Never used  Substance and Sexual Activity   Alcohol use: Not on file    Comment: quit drinking in October 2015. He says before this he says he stayed drunk every day.    Drug use: No    Comment: last use october 2015   Sexual activity:  Never    Birth control/protection: None  Other Topics Concern   Not on file  Social History Narrative   Not on file   Social Determinants of Health   Financial Resource Strain: Not on file  Food Insecurity: Not on file  Transportation Needs: Not on file  Physical Activity: Not on file  Stress: Not on file  Social Connections: Not on file      Review of Systems: A 12 point ROS discussed and pertinent positives are indicated in the HPI above.  All other systems are negative.  Review of Systems  Constitutional:  Negative for chills and fever.  Respiratory:  Negative for cough and shortness of breath.   Cardiovascular:  Negative for chest pain and leg swelling.  Gastrointestinal:  Negative for abdominal pain.  Musculoskeletal:  Positive for back pain.       Pt c/o L flank pain x 1 month that is not relieved by medication prescribed by his PCP   Vital Signs: BP 123/72 (BP Location: Right Arm)   Pulse Stanley Jimenez Kitchen)  102   Temp 98.2 F (36.8 C) (Oral)   Ht '5\' 10"'$  (1.778 m)   Wt 157 lb (71.2 kg)   SpO2 98%   BMI 22.53 kg/m   Physical Exam Constitutional:      Appearance: Normal appearance.  HENT:     Mouth/Throat:     Mouth: Mucous membranes are moist.     Pharynx: Oropharynx is clear.  Cardiovascular:     Rate and Rhythm: Regular rhythm. Tachycardia present.     Pulses: Normal pulses.     Heart sounds: No murmur heard.   No gallop.  Pulmonary:     Effort: Pulmonary effort is normal. No respiratory distress.     Breath sounds: Normal breath sounds. No stridor. No wheezing, rhonchi or rales.  Abdominal:     General: There is no distension.     Palpations: Abdomen is soft.     Tenderness: There is no abdominal tenderness. There is no guarding.  Musculoskeletal:     Right lower leg: No edema.     Left lower leg: No edema.  Skin:    General: Skin is warm and dry.  Neurological:     Mental Status: He is alert and oriented to person, place, and time. Mental status is at  baseline.  Psychiatric:        Mood and Affect: Mood normal.        Behavior: Behavior normal.        Thought Content: Thought content normal.        Judgment: Judgment normal.    Imaging: NM Bone Scan Whole Body  Result Date: 01/09/2021 CLINICAL DATA:  Prostate cancer, gastrointestinal cancer EXAM: NUCLEAR MEDICINE WHOLE BODY BONE SCAN TECHNIQUE: Whole body anterior and posterior images were obtained approximately 3 hours after intravenous injection of radiopharmaceutical. RADIOPHARMACEUTICALS:  21.0 mCi Technetium-74mMDP IV COMPARISON:  12/18/2020, 07/30/2020 FINDINGS: Anterior and posterior whole body planar images are obtained. There is physiologic excretion of radiotracer within the kidneys and bladder. Multiple foci of increased radiotracer activity are seen within the thoracic cage, corresponding to the lytic rib lesions seen on recent CT chest 12/18/2020. Areas of most intense activity are seen within the right anterior second rib, right posterolateral seventh rib, and left anterolateral fifth rib. There is also intense focal activity within the left aspect of the sternal manubrium. Focal areas of activity are also seen within the bilateral shoulders, suspicious for metastatic disease given a lytic lesion within the left glenoid seen on recent CT. There is a mottled appearance of the thoracic spine, without distinct increased radiotracer activity to correspond to the lytic lesions seen on recent CT. No other areas of abnormal radiotracer uptake. IMPRESSION: 1. Multifocal abnormal radiotracer uptake throughout the thoracic cage, bilateral shoulders, and sternum consistent with bony metastases. 2. The lytic lesions seen within the thoracic spine on recent chest CT do not have clear correlate abnormalities on this exam, though activity throughout the thoracic spine is somewhat mottled. Electronically Signed   By: MRanda NgoM.D.   On: 01/09/2021 22:03   CT Abdomen Pelvis W Contrast  Result  Date: 01/19/2021 CLINICAL DATA:  65year old male with history of squamous cell carcinoma of the esophagus. Follow-up study. EXAM: CT ABDOMEN AND PELVIS WITH CONTRAST TECHNIQUE: Multidetector CT imaging of the abdomen and pelvis was performed using the standard protocol following bolus administration of intravenous contrast. CONTRAST:  830mOMNIPAQUE IOHEXOL 350 MG/ML SOLN COMPARISON:  CT the chest, abdomen and pelvis 07/30/2020. FINDINGS: Lower chest: Minimal  fullness of the distal esophagus at the gastroesophageal junction, without discrete esophageal mass confidently identified on today's examination. Atherosclerotic calcifications in the descending thoracic aorta as well as the left circumflex and right coronary arteries. Calcifications of the aortic valve. Central venous catheter tip terminating in the right atrium. Hepatobiliary: Compared to the prior study, there are now numerous poorly defined hypovascular lesions scattered throughout the hepatic parenchyma, indicative of widespread metastatic disease to the liver, largest of which is in segment 2 (axial image 15 of series 2) measuring 3.8 x 2.3 cm. No intra or extrahepatic biliary ductal dilatation. Gallbladder is nearly completely decompressed, but otherwise unremarkable in appearance. Pancreas: No pancreatic mass. No pancreatic ductal dilatation. No pancreatic or peripancreatic fluid collections or inflammatory changes. Spleen: Unremarkable. Adrenals/Urinary Tract: There are multiple subcentimeter low-attenuation lesions scattered throughout the kidneys bilaterally, too small to characterize, but statistically likely to represent tiny cysts. No aggressive renal lesions are noted. Bilateral adrenal glands are normal in appearance. No hydroureteronephrosis. Urinary bladder is normal in appearance. Stomach/Bowel: Mild subjective thickening at the gastroesophageal junction without clearly definable mass confidently identified on today's examination. Or distal  aspects of the stomach are otherwise normal in appearance. No pathologic dilatation of small bowel or colon. The appendix is not confidently identified and may be surgically absent. Regardless, there are no inflammatory changes noted adjacent to the cecum to suggest the presence of an acute appendicitis at this time. Vascular/Lymphatic: Aortic atherosclerosis, without evidence of aneurysm or dissection in the abdominal or pelvic vasculature. Numerous enlarged lymph nodes are now noted in the upper abdomen and retroperitoneum, most conspicuous of which is a bulky conglomerate nodal mass intimately associated with the celiac axis extending partially into the porta hepatis immediately cephalad to the proximal pancreatic body (axial image 21 of series 2 and coronal image 47 of series 5) estimated to measure approximately 4.3 x 3.0 x 3.0 cm. This appears to cause some mild narrowing of adjacent vascular structures, most notably the common hepatic artery and proximal splenic artery, both of which appear patent at this time. Additional retroperitoneal lymphadenopathy is noted, measuring up to 1.2 cm in short axis in the left para-aortic nodal station adjacent to the left renal hilum (axial image 28 of series 2). Reproductive: Brachytherapy implants throughout the prostate gland. Seminal vesicles are unremarkable in appearance. Other: No significant volume of ascites.  No pneumoperitoneum. Musculoskeletal: Widespread lytic lesions are now noted throughout the visualized axial and appendicular skeleton, indicative of widespread metastatic disease to the bones IMPRESSION: 1. Mild subjective thickening at the gastroesophageal junction without a well-defined mass. However, there is clear evidence of progression of disease, including extensive hepatic metastases, upper abdominal and retroperitoneal lymphadenopathy, and widespread metastatic disease to the bones, as detailed above. 2. Aortic atherosclerosis, in addition to least  2 vessel coronary artery disease. Please note that although the presence of coronary artery calcium documents the presence of coronary artery disease, the severity of this disease and any potential stenosis cannot be assessed on this non-gated CT examination. Assessment for potential risk factor modification, dietary therapy or pharmacologic therapy may be warranted, if clinically indicated. 3. There are calcifications of the aortic valve. Echocardiographic correlation for evaluation of potential valvular dysfunction may be warranted if clinically indicated. 4. Additional incidental findings, as above. Electronically Signed   By: Vinnie Langton M.D.   On: 01/19/2021 08:35    Labs:  CBC: Recent Labs    07/30/20 1458 11/21/20 0857 01/16/21 1141  WBC 9.4 9.3 9.1  HGB  13.3 12.1* 11.0*  HCT 40.7 37.6 34.0*  PLT 207 250 307    COAGS: No results for input(s): INR, APTT in the last 8760 hours.  BMP: Recent Labs    07/30/20 1458 11/21/20 0857 01/16/21 1141  NA 135 140 135  K 3.7 4.7 4.1  CL 102 104 103  CO2 '24 20 25  '$ GLUCOSE 91 93 94  BUN '16 17 20  '$ CALCIUM 8.7* 9.3 9.3  CREATININE 1.03 1.17 0.94  GFRNONAA >60  --  >60    LIVER FUNCTION TESTS: Recent Labs    07/30/20 1458 11/21/20 0857 01/16/21 1141  BILITOT 0.7 0.5 0.4  AST '23 23 29  '$ ALT 34 24 27  ALKPHOS 78 126* 135*  PROT 7.4 6.9 7.8  ALBUMIN 4.1 4.0 3.7    TUMOR MARKERS: No results for input(s): AFPTM, CEA, CA199, CHROMGRNA in the last 8760 hours.  Assessment and Plan:  Hx of alcoholic cirrhosis, depression, PSVT, thrombocytopenia, HLD, HTN, prostate cancer, and esophageal cancer.  Patient had CT abdomen on 01/19/2021 that noted new liver lesions.  Dr. Delton Coombes referred patient today for liver lesion biopsy.  Liver lesion biopsy was approved by Dr. Vernard Gambles, IR.  Pt states he is NPO per order VSS He does not take blood thinners Pt is A&O and in NAD Ordered labs are pending   Risks and benefits of liver  lesion biopsy was discussed with the patient and/or patient's family including, but not limited to bleeding, infection, damage to adjacent structures or low yield requiring additional tests.  All of the questions were answered and there is agreement to proceed.  Consent signed and in chart.   Thank you for this interesting consult.  I greatly enjoyed meeting IZEKIEL GERARDOT and look forward to participating in their care.  A copy of this report was sent to the requesting provider on this date.  Electronically Signed: Tyson Alias, NP 02/06/2021, 11:26 AM   I spent a total of 30 minutes in face to face in clinical consultation, greater than 50% of which was counseling/coordinating care for liver lesion biopsy.

## 2021-02-09 ENCOUNTER — Ambulatory Visit (HOSPITAL_COMMUNITY): Payer: Medicaid Other | Admitting: Hematology

## 2021-02-16 LAB — SURGICAL PATHOLOGY

## 2021-02-16 NOTE — Progress Notes (Signed)
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  Gleason 3+3 = 6 pattern.   08/14/2014 Initial Diagnosis   Malignant neoplasm of prostate [facility])    - 09/20/2014 Radiation Therapy   Brachytherapy with I-125   Malignant tumor of lower third of esophagus ([place])  04/14/2016 Procedure   EGD by Dr. Laural Golden.   04/16/2016 Pathology Results   Esophagus, biopsy - INVASIVE POORLY DIFFERENTIATED SQUAMOUS CELL CARCINOMA.   04/26/2016 Imaging   CT CAP- 1. Prominent gastroesophageal junction without discrete esophageal mass. Notably, there is also a borderline enlarged enhancing lymph node in the gastrohepatic ligament immediately adjacent to the gastroesophageal junction, which is nonspecific, but could indicate early nodal disease. 2. The small cluster of low-attenuation lesions in segment 7 of the liver are too small to characterize. Statistically, these are likely to represent cysts. If there is clinical concern for metastatic disease to the liver, these could be further characterized with MRI of the abdomen with and without IV gadolinium at this time. 3. No other potential sites of metastatic disease noted elsewhere in the chest, abdomen or pelvis.   05/10/2016 PET scan   Hypermetabolic bilateral lower thoracic esophageal mass, consistent with known primary esophageal carcinoma.  Small hypermetabolic  lymph node in the upper gastrohepatic ligament adjacent to GE junction, consistent with lymph node metastasis.  No other sites of metastatic disease identified.   05/31/2016 Procedure   Port placed by Dr. Rosana Hoes   06/02/2016 - 07/14/2016 Chemotherapy   Weekly Carboplatin/Paclitaxel with XRT.  Tolerated extremely well.    06/02/2016 - 07/14/2016 Radiation Therapy   [place], [place].  Tolerated well.   08/23/2016 PET scan   1. Complete metabolic response. No residual hypermetabolism in the thoracic esophagus or in the upper gastrohepatic ligament lymph node, which is decreased in size. 2. Aortic atherosclerosis.  Three-vessel coronary atherosclerosis.   08/24/2016 Miscellaneous   CTS consult with Dr. Servando Snare- "I've recommended to him that surgical resection offers the best chance of cure, if he waits and there is recurrent disease remaining no longer be a surgical candidate. From his discussion today I think he is not inclined to agree but it has been offered."   03/09/2017 Imaging   CT CAP: 1. Mild distal esophageal and GE junction thickening, which appears slightly more prominent compared with PET-CT 08/20/2016. Correlation with PET-CT suggested. 2. Negative for pulmonary nodules or metastatic disease to the chest. 3. Stable subcentimeter posterior right hepatic lobe hypodensities. No definite evidence for metastatic disease to the abdomen or pelvis   04/28/2017 PET scan   No evidence of recurrent or metastatic carcinoma.     CANCER STAGING: Cancer Staging Malignant tumor of lower third of esophagus ([place]) Staging form: Esophagus - Squamous Cell Carcinoma, AJCC 8th Edition - Clinical stage from 06/15/2016: Stage Unknown (cTX, cN1, cM0, L: Lower) - Signed by Baird Cancer, PA-C on 06/15/2016   INTERVAL HISTORY:  Stanley Jimenez, a 65  y.o. male, returns for routine follow-up of his squamous cell carcinoma of the esophagus. Stanley Jimenez was last seen on 01/21/2021.   Today he reports  feeling well. He is taking hydrocodone every 8 hours for his left chest pain. He denies any tingling/numbness. He denies any history of cardiac problems. His appetite is good.   REVIEW OF SYSTEMS:  Review of Systems  Constitutional:  Negative for appetite change (60%) and fatigue.  Cardiovascular:  Positive for chest pain (6/10 L chest).  Neurological:  Positive for dizziness.  Psychiatric/Behavioral:  Positive for sleep disturbance (d/t pain).   All other systems reviewed and are negative.  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HCC)    Clubbing of fingers    congenital   Depression    Full dentures    History of PSVT (paroxysmal supraventricular tachycardia)    run of non-sustatined VT 03-10-2015 in setting of alcohol withdrawal in hospital   History of seizure    03-08-2012  alcohol withdrawal   History of thrombocytopenia    10/ 2013  in setting of alcohol withdrawal   Hyperlipidemia    Hypertension    Prostate cancer [facility]) urologist-  dr dalhstedt/  oncologist- dr Tammi Klippel   T1c, Gleason 3+3,  PSA 8.89,  vol 27cc   Psoriasis, 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   Squamous cell carcinoma of esophagus ([place]) 04/21/2016   Past Surgical History:  Procedure Laterality Date   BIOPSY  04/14/2016   Procedure: BIOPSY;  Surgeon: Rogene Houston, MD;  [facility];  Service: Endoscopy;;  esophagus   COLONOSCOPY N/A 04/14/2016   Procedure: COLONOSCOPY;  Surgeon: Rogene Houston, MD;  [facility];  Service: Endoscopy;  Laterality: N/A;   ESOPHAGOGASTRODUODENOSCOPY N/A 04/14/2016   Procedure: ESOPHAGOGASTRODUODENOSCOPY (EGD);  Surgeon: Rogene Houston, MD;  Location: [facility];  Service: Endoscopy;  Laterality: N/A;  1:00   NO PAST SURGERIES     PORTACATH  PLACEMENT Right 05/31/2016   Procedure: INSERTION OF TUNNELED RIGHT INTERNAL JUGULAR BARD POWERPORT CENTRAL VENOUS CATHETER WITH SUBCUTANEOUS PORT;  Surgeon: Vickie Epley, MD;  Location: [facility];  Service: Vascular;  Laterality: Right;   RADIOACTIVE SEED IMPLANT N/A 09/20/2014   Procedure: RADIOACTIVE SEED IMPLANT;  Surgeon: Franchot Gallo, MD;  Location: [facility];  Service: Urology;  Laterality: N/A;    91   seeds implanted no seeds found in bladder   TRANSURETHRAL RESECTION OF PROSTATE      SOCIAL HISTORY:  Social History   Socioeconomic History   Marital status: Single    Spouse name: Not on file   Number of children: Not on file   Years of education: Not on file   Highest education level: Not on file  Occupational History   Not on file  Tobacco Use   Smoking status: Former    Packs/day: 0.50    Years: 20.00    Pack years: 10.00    Types: Cigarettes    Quit date: 11/12/2020    Years since quitting: 0.2   Smokeless tobacco: Former    Types: Chew    Quit date: 05/27/1984   Tobacco comments:    only smoking a few  Vaping Use   Vaping Use: Never used  Substance and Sexual Activity   Alcohol use: Not on file    Comment: quit drinking in October 2015. He says before this he says he stayed drunk every day.    Drug use: No    Comment: last use october 2015   Sexual activity: Never    Birth control/protection: None  Other Topics Concern   Not on file  Social History Narrative   Not on file   Social Determinants of Health   Financial Resource Strain: Not on file  Food Insecurity: Not on file  Transportation Needs: Not on file  Physical Activity: Not on file  Stress: Not on file  Social Connections: Not on file  Intimate Partner Violence: Not on file    FAMILY HISTORY:  Family History  Problem Relation Age of Onset   Alcohol abuse Mother    Diabetes Sister     CURRENT MEDICATIONS:  Current Outpatient Medications  Medication Sig Dispense  Refill   amLODipine (NORVASC) 10 MG tablet TAKE 1 TABLET BY MOUTH DAILY 90 tablet 1   aspirin EC 81 MG tablet Take 1 tablet (81 mg total) by mouth daily. 90 tablet 3   buPROPion (WELLBUTRIN XL) 150 MG 24 hr tablet TAKE 1 TABLET(150 MG) BY MOUTH DAILY 30 tablet 2   rosuvastatin (CRESTOR) 20 MG tablet TAKE 1 TABLET(20 MG) BY MOUTH DAILY 30 tablet 3   tamsulosin (FLOMAX) 0.4 MG CAPS capsule TAKE 1 CAPSULE(0.4 MG) BY MOUTH DAILY 90 capsule 1   triamcinolone (KENALOG) 0.1 % APPLY TO THE AFFECTED AREA TWICE DAILY 454 g 0   HYDROcodone-acetaminophen (NORCO) 5-325 MG tablet Take 1 tablet by mouth every 6 (six) hours as needed for moderate pain. 120 tablet 0   No current facility-administered medications for this visit.    ALLERGIES:  No Known Allergies  PHYSICAL EXAM:  Performance status (ECOG): 1 - Symptomatic but completely ambulatory  Vitals:   02/17/21 1002  BP: (!) 144/76  Pulse: 94  Resp: 17  Temp: (!) 97 F (36.1 C)  SpO2: 99%   Wt Readings from Last 3 Encounters:  02/17/21 151 lb 6.4 oz (68.7 kg)  02/06/21 157 lb (71.2 kg)  01/21/21 157 lb 14.4 oz (71.6 kg)   Physical Exam Vitals reviewed.  Constitutional:      Appearance: Normal appearance.     Comments: Port-a-cath in R chest  Cardiovascular:     Rate and Rhythm: Normal rate and regular rhythm.     Pulses: Normal pulses.     Heart sounds: Normal heart sounds.  Pulmonary:     Effort: Pulmonary effort is normal.     Breath sounds: Normal breath sounds.  Neurological:     General: No focal deficit present.     Mental Status: He is alert and oriented to person, place, and time.  Psychiatric:        Mood and Affect: Mood normal.        Behavior: Behavior normal.     LABORATORY DATA:  I have reviewed the labs as listed.  CBC Latest Ref Rng & Units 02/06/2021 01/16/2021 11/21/2020  WBC 4.0 - 10.5 K/uL 7.5 9.1 9.3  Hemoglobin 13.0 - 17.0 g/dL 12.6(L) 11.0(L) 12.1(L)  Hematocrit 39.0 - 52.0 % 38.8(L) 34.0(L) 37.6   Platelets 150 - 400 K/uL 292 307 250   CMP Latest Ref Rng & Units 01/16/2021 11/21/2020 07/30/2020  Glucose 70 - 99 mg/dL 94 93 91  BUN 8 - 23 mg/dL _0 Creatinine 0.61 -  1.24 mg/dL 0.94 1.17 1.03  Sodium 135 - 145 mmol/L 135 140 135  Potassium 3.5 - 5.1 mmol/L 4.1 4.7 3.7  Chloride 98 - 111 mmol/L 103 104 102  CO2 22 - 32 mmol/L _0 Calcium 8.9 - 10.3 mg/dL 9.3 9.3 8.7(L)  Total Protein 6.5 - 8.1 g/dL 7.8 6.9 7.4  Total Bilirubin 0.3 - 1.2 mg/dL 0.4 0.5 0.7  Alkaline Phos 38 - 126 U/L 135(H) 126(H) 78  AST 15 - 41 U/L _1 ALT 0 - 44 U/L 27 24 34    DIAGNOSTIC IMAGING:  I have independently reviewed the scans and discussed with the patient. [place] CORE BIOPSY (LIVER)  Result Date: 02/06/2021 INDICATION: History of squamous carcinoma of the esophagus and liver lesions suggestive of metastatic disease. EXAM: ULTRASOUND GUIDED CORE BIOPSY OF LIVER MEDICATIONS: None. ANESTHESIA/SEDATION: Fentanyl 100 mcg IV; Versed 2.0 mg IV Moderate Sedation Time:  20 minutes. The patient was continuously monitored during the procedure by the interventional radiology nurse under my direct supervision. PROCEDURE: The procedure, risks, benefits, and alternatives were explained to the patient. Questions regarding the procedure were encouraged and answered. The patient understands and consents to the procedure. A time-out was performed prior to initiating the procedure. Ultrasound was used to localize liver lesions. The abdominal wall was prepped with chlorhexidine in a sterile fashion, and a sterile drape was applied covering the operative field. A sterile gown and sterile gloves were used for the procedure. Local anesthesia was provided with 1% Lidocaine. Under ultrasound guidance, a 17 gauge trocar needle was advanced to the periphery of a lesion within the right lobe of the liver. After confirming needle tip position, 3 separate coaxial 18 gauge core biopsy samples were obtained and submitted in  formalin. Gel-Foam pledgets were advanced through the outer needle as the needle was retracted and removed. COMPLICATIONS: None immediate. FINDINGS: Multiple hypoechoic oval and rounded lesions are seen in both lobes of the liver. The most accessible was a lesion in the right lobe measuring roughly 2.3 cm in greatest diameter. Solid tissue was obtained. IMPRESSION: Ultrasound-guided core biopsy performed a mass within the right lobe of the liver. Electronically Signed   By: Aletta Edouard M.D.   On: 02/06/2021 17:20     ASSESSMENT:  1.  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 - I have reviewed images of the CT AP with contrast from 01/16/2021 which showed mild subjective thickening at the GE junction without defined mass.  There is extensive hepatic metastasis, upper abdominal and retroperitoneal adenopathy and widespread metastatic disease to the bones. - Liver lesion biopsy on 02/06/2021 consistent with high-grade poorly differentiated carcinoma (adeno squamous carcinoma).   2.  Social/family history: - He lives at home by himself and is independent of ADLs and IADLs.  He worked in [employer] in the past.  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  His sister lives close by. - He smoked half pack per day for 48 years and quit around 12-07-20. - 2 of his sisters died of cancer.  Patient does not know the type.   PLAN:  1.  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 - We reviewed  results of biopsy of the liver which showed poorly differentiated carcinoma consistent with adenosquamous variant. - I have recommended sending testing for  HER2 and PD-L1 and NGS. - CEA was elevated at 296. - We discussed palliative chemotherapy to improve symptoms and prolong life. - I have recommended FOLFOX chemotherapy.  We will add Herceptin and immunotherapy based on the results. - We discussed schedule and adverse effects of this chemo regimen in detail. - We will likely start his treatment next week.  RTC 3 weeks for follow-up.   2.  Chest wall and back pain: - Poorly controlled with current regimen of hydrocodone every 8 hours. - Will increase hydrocodone to every 6 hours as needed.  3.  Weight loss: - He lost about 6 pounds in the last 4 weeks. - He was told to drink nutritional supplements along with eating.  If no improvement we will consider appetite stimulants.  4.  Normocytic anemia: - SPEP on 01/21/2021 was negative. - Folic acid, Z61 and copper levels were normal.  Ferritin was 592 with percent saturation of 8.  LDH was 250.  Free light chain ratio was 1.97 with kappa light chain 64 and lambda light chains 32. - Mild anemia consistent with chronic inflammation from malignancy.  5.  Normocytic anemia: - Latest labs on 01/16/2021 shows hemoglobin down to 11 with MCV 86.  White count and platelets were normal. - Recommend checking ferritin, iron panel, W96, folic acid, copper levels today.  We will also check SPEP, free light chains, immunofixation and LDH.     Orders placed this encounter:  Orders Placed This Encounter  Procedures   ECHOCARDIOGRAM COMPLETE   Total time spent is 40 minutes with more than 50% of the time spent face-to-face discussing biopsy results, new treatment plan, adverse effects, counseling and coordination of care.  Derek Jack, MD [place] [phone]   I, Thana Ates, am acting as a scribe for Dr. Derek Jack.  I, Derek Jack MD, have reviewed the above documentation for accuracy and completeness, and I agree with the above.

## 2021-02-17 ENCOUNTER — Other Ambulatory Visit (HOSPITAL_COMMUNITY): Payer: Self-pay | Admitting: *Deleted

## 2021-02-17 ENCOUNTER — Encounter (HOSPITAL_COMMUNITY): Payer: Self-pay

## 2021-02-17 ENCOUNTER — Inpatient Hospital Stay (HOSPITAL_COMMUNITY): Payer: Medicaid Other | Attending: Hematology | Admitting: Hematology

## 2021-02-17 ENCOUNTER — Inpatient Hospital Stay (HOSPITAL_COMMUNITY): Payer: Medicaid Other

## 2021-02-17 ENCOUNTER — Other Ambulatory Visit: Payer: Self-pay

## 2021-02-17 ENCOUNTER — Other Ambulatory Visit: Payer: Self-pay | Admitting: Family Medicine

## 2021-02-17 VITALS — BP 144/76 | HR 94 | Temp 97.0°F | Resp 17 | Wt 151.4 lb

## 2021-02-17 DIAGNOSIS — C772 Secondary and unspecified malignant neoplasm of intra-abdominal lymph nodes: Secondary | ICD-10-CM | POA: Diagnosis not present

## 2021-02-17 DIAGNOSIS — K769 Liver disease, unspecified: Secondary | ICD-10-CM | POA: Diagnosis not present

## 2021-02-17 DIAGNOSIS — C159 Malignant neoplasm of esophagus, unspecified: Secondary | ICD-10-CM | POA: Diagnosis not present

## 2021-02-17 DIAGNOSIS — C155 Malignant neoplasm of lower third of esophagus: Secondary | ICD-10-CM | POA: Diagnosis not present

## 2021-02-17 DIAGNOSIS — C7951 Secondary malignant neoplasm of bone: Secondary | ICD-10-CM | POA: Diagnosis not present

## 2021-02-17 DIAGNOSIS — C787 Secondary malignant neoplasm of liver and intrahepatic bile duct: Secondary | ICD-10-CM | POA: Insufficient documentation

## 2021-02-17 DIAGNOSIS — D649 Anemia, unspecified: Secondary | ICD-10-CM | POA: Insufficient documentation

## 2021-02-17 MED ORDER — SODIUM CHLORIDE 0.9% FLUSH
10.0000 mL | Freq: Once | INTRAVENOUS | Status: AC
  Administered 2021-02-17: 10 mL via INTRAVENOUS

## 2021-02-17 MED ORDER — HYDROCODONE-ACETAMINOPHEN 5-325 MG PO TABS: 1.0000 | ORAL_TABLET | Freq: Four times a day (QID) | ORAL | 0 refills | Status: DC | PRN

## 2021-02-17 MED ORDER — HEPARIN SOD (PORK) LOCK FLUSH 100 UNIT/ML IV SOLN
500.0000 [IU] | Freq: Once | INTRAVENOUS | Status: AC
  Administered 2021-02-17: 500 [IU] via INTRAVENOUS

## 2021-02-17 NOTE — Patient Instructions (Signed)
[place] at [facility] Discharge Instructions  You were seen today by Dr. Delton Coombes. He went over your recent results and scans. You begin taking hydrocodone every 6 hours as needed for pain. You will be scheduled to start your new chemotherapy treatment (FOLFOX) in 1 week. Dr. Delton Coombes will see you back in 3 weeks for labs and follow up.   Thank you for choosing [place] at [facility] to provide your oncology and hematology care.  To afford each patient quality time with our provider, please arrive at least 15 minutes before your scheduled appointment time.   If you have a lab appointment with the [facility] please come in thru the Main Entrance and check in at the main information desk  You need to re-schedule your appointment should you arrive 10 or more minutes late.  We strive to give you quality time with our providers, and arriving late affects you and other patients whose appointments are after yours.  Also, if you no show three or more times for appointments you may be dismissed from the clinic at the providers discretion.     Again, thank you for choosing [facility].  Our hope is that these requests will decrease the amount of time that you wait before being seen by our physicians.       _____________________________________________________________  Should you have questions after your visit to [facility], please contact our office at [phone] between the hours of 8:00 a.m. and 4:30 p.m.  Voicemails left after 4:00 p.m. will not be returned until the following business day.  For prescription refill requests, have your pharmacy contact our office and allow 72 hours.    Cancer Center Support Programs:   > Cancer Support Group  2nd Tuesday of the month 1pm-2pm, Journey Room

## 2021-02-17 NOTE — Progress Notes (Signed)
DISCONTINUE ON PATHWAY REGIMEN - Gastroesophageal     Administer weekly during RT:     Paclitaxel        Dose Mod: None     Carboplatin        Dose Mod: None  **Always confirm dose/schedule in your pharmacy ordering system**  REASON: Other Reason PRIOR TREATMENT: [id number]: Carboplatin + Paclitaxel (2/50) Weekly (x 5-6 Weeks) with Concurrent RT TREATMENT RESPONSE: Complete Response (CR)  START ON PATHWAY REGIMEN - Gastroesophageal     A cycle is every 14 days:     Oxaliplatin      Leucovorin      Fluorouracil      Fluorouracil   **Always confirm dose/schedule in your pharmacy ordering system**  Patient Characteristics: Distant Metastases (cM1/pM1) / Locally Recurrent Disease, Squamous Cell, Esophageal & GE Junction, First Line, PD?L1 Expression  CPS < 1/Negative/Unknown, Prior Taxane or Taxane Contraindicated Histology: Squamous Cell Disease Classification: Esophageal Therapeutic Status: Distant Metastases (No Additional Staging) Line of Therapy: First Line PD-L1 Expression Status: Awaiting Test Results Taxane Status: Prior Taxane Intent of Therapy: Non-Curative / Palliative Intent, Discussed with Patient

## 2021-02-17 NOTE — Progress Notes (Signed)
Patient presents today for PF only. Patient's port accessed with H20G without difficulty. Flushes well with blood return. Patient's port-a-cath heparin flushed and deaccessed. Patient tolerated procedure well and without complaints. Patient discharged from clinic ambulatory and in satisfactory condition.

## 2021-02-18 ENCOUNTER — Ambulatory Visit (HOSPITAL_COMMUNITY): Payer: Medicaid Other | Admitting: Hematology

## 2021-02-18 NOTE — Progress Notes (Signed)
Pharmacist Chemotherapy Monitoring - Initial Assessment    Anticipated start date: 10.03.22   The following has been reviewed per standard work regarding the patient's treatment regimen: The patient's diagnosis, treatment plan and drug doses, and organ/hematologic function Lab orders and baseline tests specific to treatment regimen  The treatment plan start date, drug sequencing, and pre-medications Prior authorization status  Patient's documented medication list, including drug-drug interaction screen and prescriptions for anti-emetics and supportive care specific to the treatment regimen The drug concentrations, fluid compatibility, administration routes, and timing of the medications to be used The patient's access for treatment and lifetime cumulative dose history, if applicable  The patient's medication allergies and previous infusion related reactions, if applicable   Changes made to treatment plan:  treatment plan date  Follow up needed:  N/A   Wynona Neat, [facility], 02/18/2021  11:20 AM

## 2021-02-20 ENCOUNTER — Inpatient Hospital Stay (HOSPITAL_COMMUNITY): Payer: Medicaid Other

## 2021-02-20 ENCOUNTER — Other Ambulatory Visit: Payer: Self-pay

## 2021-02-20 ENCOUNTER — Encounter (HOSPITAL_COMMUNITY): Payer: Self-pay

## 2021-02-20 ENCOUNTER — Encounter (HOSPITAL_COMMUNITY): Payer: Self-pay | Admitting: Hematology

## 2021-02-20 DIAGNOSIS — C155 Malignant neoplasm of lower third of esophagus: Secondary | ICD-10-CM

## 2021-02-20 DIAGNOSIS — Z95828 Presence of other vascular implants and grafts: Secondary | ICD-10-CM

## 2021-02-20 HISTORY — DX: Presence of other vascular implants and grafts: Z95.828

## 2021-02-20 MED ORDER — PROCHLORPERAZINE MALEATE 10 MG PO TABS: 10.0000 mg | ORAL_TABLET | Freq: Four times a day (QID) | ORAL | 1 refills | Status: DC | PRN

## 2021-02-20 MED ORDER — LIDOCAINE-PRILOCAINE 2.5-2.5 % EX CREA: TOPICAL_CREAM | CUTANEOUS | 3 refills | Status: DC

## 2021-02-20 NOTE — Patient Instructions (Signed)
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  You will be treated in the clinic every 2 weeks with a combination of chemotherapy drugs.  Those drugs are oxaliplatin, leucovorin, and fluorouracil (5FU).  The intent of treatment is to control this disease, keep it from spreading further, and to alleviate any symptoms you may be having related to this cancer. You will see the doctor regularly throughout treatment.  We will obtain blood work from you prior to every treatment and monitor your results to make sure it is safe to give your treatment. The doctor monitors your response to treatment by the way you are feeling, your blood work, and by obtaining scans periodically.  There will be wait times while you are here for treatment.  It will take about 30 minutes to 1 hour for your lab work to result.  Then there will be wait times while pharmacy mixes your medications.    Medications you will receive in the clinic prior to your chemotherapy medications:  Aloxi:  ALOXI is used in adults to help prevent nausea and vomiting that happens with certain chemotherapy drugs.  Aloxi is a long acting medication, and will remain in your system for about two days.   Dexamethasone:  This is a steroid given prior to chemotherapy to help prevent allergic reactions; it may also help prevent and control nausea and diarrhea.     Oxaliplatin (Eloxatin)  About This Drug  Oxaliplatin is used to treat cancer. It is given in the vein (IV).  It takes two hours to infuse.  Possible Side Effects   Bone marrow suppression. This is a decrease in the number of white blood cells, red blood cells, and platelets. This may raise your risk of infection, make you tired and weak (fatigue), and raise your risk of bleeding.   Tiredness   Soreness of the mouth and throat. You may have red areas, white patches, or sores that hurt.   Nausea and vomiting  (throwing up)   Diarrhea (loose bowel movements)   Changes in your liver function   Effects on the nerves called peripheral neuropathy. You may feel numbness, tingling, or pain in your hands and feet, and may be worse in cold temperatures. It may be hard for you to button your clothes, open jars, or walk as usual. The effect on the nerves may get worse with more doses of the drug. These effects get better in some people after the drug is stopped but it does not get better in all people  Note: Each of the side effects above was reported in 40% or greater of patients treated with oxaliplatin. Not all possible side effects are included above.   Warnings and Precautions   Allergic reactions, including anaphylaxis, which may be life-threatening are rare but may happen in some patients. Signs of allergic reaction to this drug may be swelling of the face, feeling like your tongue or throat are swelling, trouble breathing, rash, itching, fever, chills, feeling dizzy, and/or feeling that your heart is beating in a fast or not normal way. If this happens, do not take another dose of this drug. You should get urgent medical treatment.   Inflammation (swelling) of the lungs, which may be life-threatening. You may have a dry cough or trouble breathing.   Effects on the nerves (neuropathy) may resolve within 14 days, or it may persist beyond 14 days.   Severe decrease in white blood cells when  combined with the chemotherapy agents 5-fluorouracil and leucovorin. This may be life-threatening.   Severe changes in your liver function   Abnormal heart beat and/or EKG, which can be life-threatening   Rhabdomyolysis- damage to your muscles which may release proteins in your blood and affect how your kidneys work, which can be life-threatening. You may have severe muscle weakness and/or pain, or dark urine.  Important Information   This drug may impair your ability to drive or use machinery. Talk to your  doctor and/or nurse about precautions you may need to take.   This drug may be present in the saliva, tears, sweat, urine, stool, vomit, semen, and vaginal secretions. Talk to your doctor and/or your nurse about the necessary precautions to take during this time.  * The effects on the nerves can be aggravated by exposure to cold. Avoid cold beverages, use of ice and make sure you cover your skin and dress warmly prior to being exposed to cold temperatures while you are receiving treatment with oxaliplatin*   Treating Side Effects   Manage tiredness by pacing your activities for the day.   Be sure to include periods of rest between energy-draining activities.   To decrease the risk of infection, wash your hands regularly.   Avoid close contact with people who have a cold, the flu, or other infections.  Take your temperature as your doctor or nurse tells you, and whenever you feel like you may have a fever.   To help decrease the risk of bleeding, use a soft toothbrush. Check with your nurse before using dental floss.   Be very careful when using knives or tools.   Use an electric shaver instead of a razor.   Drink plenty of fluids (a minimum of eight glasses per day is recommended).   Mouth care is very important. Your mouth care should consist of routine, gentle cleaning of your teeth or dentures and rinsing your mouth with a mixture of 1/2 teaspoon of salt in 8 ounces of water or 1/2 teaspoon of baking soda in 8 ounces of water. This should be done at least after each meal and at bedtime.   If you have mouth sores, avoid mouthwash that has alcohol. Also avoid alcohol and smoking because they can bother your mouth and throat.   To help with nausea and vomiting, eat small, frequent meals instead of three large meals a day. Choose foods and drinks that are at room temperature. Ask your nurse or doctor about other helpful tips and medicine that is available to help stop or lessen these  symptoms.   If you throw up or have loose bowel movements, you should drink more fluids so that you do not become dehydrated (lack of water in the body from losing too much fluid).   If you have diarrhea, eat low-fiber foods that are high in protein and calories and avoid foods that can irritate your digestive tracts or lead to cramping.   Ask your nurse or doctor about medicine that can lessen or stop your diarrhea.   If you have numbness and tingling in your hands and feet, be careful when cooking, walking, and handling sharp objects and hot liquids.   Do not drink cold drinks or use ice in beverages. Drink fluids at room temperature or warmer, and drink through a straw.   Wear gloves to touch cold objects, and wear warm clothing and cover you skin during cold weather.   Food and Drug Interactions   There  are no known interactions of oxaliplatin with food and other medications.   This drug may interact with other medicines. Tell your doctor and pharmacist about all the prescription and over-the-counter medicines and dietary supplements (vitamins, minerals, herbs and others) that you are taking at this time. Also, check with your doctor or pharmacist before starting any new prescription or over-the-counter medicines, or dietary supplements to make sure that there are no interactions   When to Call the Doctor  Call your doctor or nurse if you have any of these symptoms and/or any new or unusual symptoms:   Fever of 100.4 F (38 C) or higher   Chills   Tiredness that interferes with your daily activities   Feeling dizzy or lightheaded   Easy bleeding or bruising   Feeling that your heart is beating in a fast or not normal way (palpitations)   Pain in your chest   Dry cough   Trouble breathing   Pain in your mouth or throat that makes it hard to eat or drink   Nausea that stops you from eating or drinking and/or is not relieved by prescribed medicines   Throwing up    Diarrhea, 4 times in one day or diarrhea with lack of strength or a feeling of being dizzy   Numbness, tingling, or pain in your hands and feet   Signs of possible liver problems: dark urine, pale bowel movements, bad stomach pain, feeling very tired and weak, unusual itching, or yellowing of the eyes or skin   Signs of rhabdomyolysis: decreased urine, very dark urine, muscle pain in the shoulders, thighs, or lower back; muscle weakness or trouble moving arms and legs   Signs of allergic reaction: swelling of the face, feeling like your tongue or throat are swelling, trouble breathing, rash, itching, fever, chills, feeling dizzy, and/or feeling that your heart is beating in a fast or not normal way. If this happens, call 911 for emergency care.   If you think you may be pregnant  Reproduction Warnings   Pregnancy warning: This drug may have harmful effects on the unborn baby. Women of childbearing potential should use effective methods of birth control during your cancer treatment. Let your doctor know right away if you think you may be pregnant or may have impregnated your partner.   Breastfeeding warning: It is not known if this drug passes into breast milk. For this reason, women should talk to their doctor about the risks and benefits of breastfeeding during treatment with this drug because this drug may enter the breast milk and cause harm to a breastfeeding baby.   Fertility warning: Human fertility studies have not been done with this drug. Talk with your doctor or nurse if you plan to have children. Ask for information on sperm or egg banking.   Leucovorin Calcium  About This Drug  Leucovorin is a vitamin. It is used in combination with other cancer fighting drugs such as 5-fluorouracil and methotrexate. Leucovorin is given in the vein (IV).  This drug runs at the same time as the oxaliplatin and takes 2 hours to infuse.   Possible Side Effects  Rash and itching  Note:  Leucovorin by itself has very few side effects. Other side effects you may have can be caused by the other drugs you are taking, such as 5-fluorouracil.   Warnings and Precautions   Allergic reactions, including anaphylaxis are rare but may happen in some patients. Signs of allergic reaction to this drug may be  swelling of the face, feeling like your tongue or throat are swelling, trouble breathing, rash, itching, fever, chills, feeling dizzy, and/or feeling that your heart is beating in a fast or not normal way. If this happens, do not take another dose of this drug. You should get urgent medical treatment.  Food and Drug Interactions   There are no known interactions of leucovorin with food.   This drug may interact with other medicines. Tell your doctor and pharmacist about all the prescription and over-the-counter medicines and dietary supplements (vitamins, minerals, herbs and others) that you are taking at this time.   Also, check with your doctor or pharmacist before starting any new prescription or over-the-counter medicines, or dietary supplements to make sure that there are no interactions.   When to Call the Doctor  Call your doctor or nurse if you have any of these symptoms and/or any new or unusual symptoms:   A new rash or a rash that is not relieved by prescribed medicines   Signs of allergic reaction: swelling of the face, feeling like your tongue or throat are swelling, trouble breathing, rash, itching, fever, chills, feeling dizzy, and/or feeling that your heart is beating in a fast or not normal way. If this happens, call 911 for emergency care.   If you think you may be pregnant   Reproduction Warnings   Pregnancy warning: It is not known if this drug may harm an unborn child. For this reason, be sure to talk with your doctor if you are pregnant or planning to become pregnant while receiving this drug. Let your doctor know right away if you think you may be  pregnant   Breastfeeding warning: It is not known if this drug passes into breast milk. For this reason, women should talk to their doctor about the risks and benefits of breastfeeding during treatment with this drug because this drug may enter the breast milk and cause harm to a breastfeeding baby.   Fertility warning: Human fertility studies have not been done with this drug. Talk with your doctor or nurse if you plan to have children. Ask for information on sperm or egg banking.   5-Fluorouracil (Adrucil; 5FU)  About This Drug  Fluorouracil is used to treat cancer. It is given in the vein (IV). It is given as an IV push from a syringe and also as a continuous infusion given via an ambulatory pump (a pump you take home and wear for a specified amount of time).  Possible Side Effects   Bone marrow suppression. This is a decrease in the number of white blood cells, red blood cells, and platelets. This may raise your risk of infection, make you tired and weak (fatigue), and raise your risk of bleeding   Changes in the tissue of the heart and/or heart attack. Some changes may happen that can cause your heart to have less ability to pump blood.   Blurred vision or other changes in eyesight   Nausea and throwing up (vomiting)   Diarrhea (loose bowel movements)   Ulcers - sores that may cause pain or bleeding in your digestive tract, which includes your mouth, esophagus, stomach, small/large intestines and rectum   Soreness of the mouth and throat. You may have red areas, white patches, or sores that hurt.   Allergic reactions, including anaphylaxis are rare but may happen in some patients. Signs of allergic reaction to this drug may be swelling of the face, feeling like your tongue or throat are  swelling, trouble breathing, rash, itching, fever, chills, feeling dizzy, and/or feeling that your heart is beating in a fast or not normal way. If this happens, do not take another dose of this  drug. You should get urgent medical treatment.   Sensitivity to light (photosensitivity). Photosensitivity means that you may become more sensitive to the sun and/or light. You may get a skin rash/reaction if you are in the sun or are exposed to sun lamps and tanning beds. Your eyes may water more, mostly in bright light.   Changes in your nail color, nail loss and/or brittle nail   Darkening of the skin, or changes to the color of your skin and/or veins used for infusion   Rash, dry skin, or itching  Note: Not all possible side effects are included above.  Warnings and Precautions   Hand-and-foot syndrome. The palms of your hands or soles of your feet may tingle, become numb, painful, swollen, or red.   Changes in your central nervous system can happen. The central nervous system is made up of your brain and spinal cord. You could feel extreme tiredness, agitation, confusion, hallucinations (see or hear things that are not there), trouble understanding or speaking, loss of control of your bowels or bladder, eyesight changes, numbness or lack of strength to your arms, legs, face, or body, or coma. If you start to have any of these symptoms let your doctor know right away.   Side effects of this drug may be unexpectedly severe in some patients  Note: Some of the side effects above are very rare. If you have concerns and/or questions, please discuss them with your medical team.   Important Information   This drug may be present in the saliva, tears, sweat, urine, stool, vomit, semen, and vaginal secretions. Talk to your doctor and/or your nurse about the necessary precautions to take during this time.   Treating Side Effects   Manage tiredness by pacing your activities for the day.   Be sure to include periods of rest between energy-draining activities.   To help decrease the risk of infections, wash your hands regularly.   Avoid close contact with people who have a cold, the flu,  or other infections.   Take your temperature as your doctor or nurse tells you, and whenever you feel like you may have a fever.   Use a soft toothbrush. Check with your nurse before using dental floss.   Be very careful when using knives or tools.   Use an electric shaver instead of a razor.   If you have a nose bleed, sit with your head tipped slightly forward. Apply pressure by lightly pinching the bridge of your nose between your thumb and forefinger. Call your doctor if you feel dizzy or faint or if the bleeding doesn't stop after 10 to 15 minutes.   Drink plenty of fluids (a minimum of eight glasses per day is recommended).   If you throw up or have loose bowel movements, you should drink more fluids so that you do not  become dehydrated (lack of water in the body from losing too much fluid).   To help with nausea and vomiting, eat small, frequent meals instead of three large meals a day. Choose foods and drinks that are at room temperature. Ask your nurse or doctor about other helpful tips and medicine that is available to help, stop, or lessen these symptoms.   If you have diarrhea, eat low-fiber foods that are high in protein  and calories and avoid foods that can irritate your digestive tracts or lead to cramping.   Ask your nurse or doctor about medicine that can lessen or stop your diarrhea.   Mouth care is very important. Your mouth care should consist of routine, gentle cleaning of your teeth or dentures and rinsing your mouth with a mixture of 1/2 teaspoon of salt in 8 ounces of water or 1/2 teaspoon of baking soda in 8 ounces of water. This should be done at least after each meal and at bedtime.   If you have mouth sores, avoid mouthwash that has alcohol. Also avoid alcohol and smoking because they can bother your mouth and throat.   Keeping your nails moisturized may help with brittleness.   To help with itching, moisturize your skin several times day.   Use sunscreen  with SPF 30 or higher when you are outdoors even for a short time. Cover up when you are out in the sun. Wear wide-brimmed hats, long-sleeved shirts, and pants. Keep your neck, chest, and back covered. Wear dark sun glasses when in the sun or bright lights.   If you get a rash do not put anything on it unless your doctor or nurse says you may. Keep the area around the rash clean and dry. Ask your doctor for medicine if your rash bothers you.   Keeping your pain under control is important to your well-being. Please tell your doctor or nurse if you are experiencing pain.   Food and Drug Interactions   There are no known interactions of fluorouracil with food.   Check with your doctor or pharmacist about all other prescription medicines and over-the-counter medicines and dietary supplements (vitamins, minerals, herbs and others) you are taking before starting this medicine as there are known drug interactions with 5-fluoroucacil. Also, check with your doctor or pharmacist before starting any new prescription or over-the-counter medicines, or dietary supplements to make sure that there are no interactions.  When to Call the Doctor  Call your doctor or nurse if you have any of these symptoms and/or any new or unusual symptoms:   Fever of 100.4 F (38 C) or higher   Chills   Easy bleeding or bruising   Nose bleed that doesn't stop bleeding after 10-15 minutes   Trouble breathing   Feeling dizzy or lightheaded   Feeling that your heart is beating in a fast or not normal way (palpitations)   Chest pain or symptoms of a heart attack. Most heart attacks involve pain in the center of the chest that lasts more than a few minutes. The pain may go away and come back or it can be constant. It can feel like pressure, squeezing, fullness, or pain. Sometimes pain is felt in one or both arms, the back, neck, jaw, or stomach. If any of these symptoms last 2 minutes, call 911.   Confusion and/or  agitation   Hallucinations   Trouble understanding or speaking   Loss of control of bowels or bladder   Blurry vision or changes in your eyesight   Headache that does not go away   Numbness or lack of strength to your arms, legs, face, or body   Nausea that stops you from eating or drinking and/or is not relieved by prescribed medicines   Throwing up more than 3 times a day   Diarrhea, 4 times in one day or diarrhea with lack of strength or a feeling of being dizzy   Pain in your  mouth or throat that makes it hard to eat or drink   Pain along the digestive tract - especially if worse after eating   Blood in your vomit (bright red or coffee-ground) and/or stools (bright red, or black/tarry)   Coughing up blood   Tiredness that interferes with your daily activities   Painful, red, or swollen areas on your hands or feet or around your nails   A new rash or a rash that is not relieved by prescribed medicines   Develop sensitivity to sunlight/light   Numbness and/or tingling of your hands and/or feet   Signs of allergic reaction: swelling of the face, feeling like your tongue or throat are swelling, trouble breathing, rash, itching, fever, chills, feeling dizzy, and/or feeling that your heart is beating in a fast or not normal way. If this happens, call 911 for emergency care.   If you think you are pregnant or may have impregnated your partner  Reproduction Warnings   Pregnancy warning: This drug may have harmful effects on the unborn baby. Women of child bearing potential should use effective methods of birth control during your cancer treatment and 3 months after treatment. Men with male partners of childbearing potential should use effective methods of birth control during your cancer treatment and for 3 months after your cancer treatment. Let your doctor know right away if you think you may be pregnant or may have impregnated your partner.   Breastfeeding warning: It  is not known if this drug passes into breast milk. For this reason, Women should not breastfeed during treatment because this drug could enter the breast milk and cause harm to a breastfeeding baby.   Fertility warning: In men and women both, this drug may affect your ability to have children in the future. Talk with your doctor or nurse if you plan to have children. Ask for information on sperm or egg banking.   SELF CARE ACTIVITIES WHILE RECEIVING CHEMOTHERAPY:  Hydration Increase your fluid intake 48 hours prior to treatment and drink at least 8 to 12 cups (64 ounces) of water/decaffeinated beverages per day after treatment. You can still have your cup of coffee or soda but these beverages do not count as part of your 8 to 12 cups that you need to drink daily. No alcohol intake.  Medications Continue taking your normal prescription medication as prescribed.  If you start any new herbal or new supplements please let us know first to make sure it is safe.  Mouth Care Have teeth cleaned professionally before starting treatment. Keep dentures and partial plates clean. Use soft toothbrush and do not use mouthwashes that contain alcohol. Biotene is a good mouthwash that is available at most pharmacies or may be ordered by calling [phone]. Use warm salt water gargles (1 teaspoon salt per 1 quart warm water) before and after meals and at bedtime. If you need dental work, please let the doctor know before you go for your appointment so that we can coordinate the best possible time for you in regards to your chemo regimen. You need to also let your dentist know that you are actively taking chemo. We may need to do labs prior to your dental appointment.  Skin Care Always use sunscreen that has not expired and with SPF (Sun Protection Factor) of 50 or higher. Wear hats to protect your head from the sun. Remember to use sunscreen on your hands, ears, face, & feet.  Use good moisturizing lotions such as  udder  cream, eucerin, or even Vaseline. Some chemotherapies can cause dry skin, color changes in your skin and nails.    Avoid long, hot showers or baths. Use gentle, fragrance-free soaps and laundry detergent. Use moisturizers, preferably creams or ointments rather than lotions because the thicker consistency is better at preventing skin dehydration. Apply the cream or ointment within 15 minutes of showering. Reapply moisturizer at night, and moisturize your hands every time after you wash them.  Hair Loss (if your doctor says your hair will fall out)  If your doctor says that your hair is likely to fall out, decide before you begin chemo whether you want to wear a wig. You may want to shop before treatment to match your hair color. Hats, turbans, and scarves can also camouflage hair loss, although some people prefer to leave their heads uncovered. If you go bare-headed outdoors, be sure to use sunscreen on your scalp. Cut your hair short. It eases the inconvenience of shedding lots of hair, but it also can reduce the emotional impact of watching your hair fall out. Don't perm or color your hair during chemotherapy. Those chemical treatments are already damaging to hair and can enhance hair loss. Once your chemo treatments are done and your hair has grown back, it's OK to resume dyeing or perming hair.  With chemotherapy, hair loss is almost always temporary. But when it grows back, it may be a different color or texture. In older adults who still had hair color before chemotherapy, the new growth may be completely gray.  Often, new hair is very fine and soft.  Infection Prevention Please wash your hands for at least 30 seconds using warm soapy water. Handwashing is the #1 way to prevent the spread of germs. Stay away from sick people or people who are getting over a cold. If you develop respiratory systems such as green/yellow mucus production or productive cough or persistent cough let us know and  we will see if you need an antibiotic. It is a good idea to keep a pair of gloves on when going into grocery stores/[employer] to decrease your risk of coming into contact with germs on the carts, etc. Carry alcohol hand gel with you at all times and use it frequently if out in public. If your temperature reaches 100.5 or higher please call the clinic and let us know.  If it is after hours or on the weekend please go to the ER if your temperature is over 100.5.  Please have your own personal thermometer at home to use.    Sex and bodily fluids If you are going to have sex, a condom must be used to protect the person that isn't taking chemotherapy. Chemo can decrease your libido (sex drive). For a few days after chemotherapy, chemotherapy can be excreted through your bodily fluids.  When using the toilet please close the lid and flush the toilet twice.  Do this for a few day after you have had chemotherapy.   Effects of chemotherapy on your sex life Some changes are simple and won't last long. They won't affect your sex life permanently.  Sometimes you may feel: too tired not strong enough to be very active sick or sore  not in the mood anxious or low Your anxiety might not seem related to sex. For example, you may be worried about the cancer and how your treatment is going. Or you may be worried about money, or about how you family are coping with your illness.  These things can cause stress, which can affect your interest in sex. It's important to talk to your partner about how you feel.  Remember - the changes to your sex life don't usually last long. There's usually no medical reason to stop having sex during chemo. The drugs won't have any long term physical effects on your performance or enjoyment of sex. Cancer can't be passed on to your partner during sex  Contraception It's important to use reliable contraception during treatment. Avoid getting pregnant while you or your partner are having  chemotherapy. This is because the drugs may harm the baby. Sometimes chemotherapy drugs can leave a man or woman infertile.  This means you would not be able to have children in the future. You might want to talk to someone about permanent infertility. It can be very difficult to learn that you may no longer be able to have children. Some people find counselling helpful. There might be ways to preserve your fertility, although this is easier for men than for women. You may want to speak to a fertility expert. You can talk about sperm banking or harvesting your eggs. You can also ask about other fertility options, such as donor eggs. If you have or have had breast cancer, your doctor might advise you not to take the contraceptive pill. This is because the hormones in it might affect the cancer. It is not known for sure whether or not chemotherapy drugs can be passed on through semen or secretions from the vagina. Because of this some doctors advise people to use a barrier method if you have sex during treatment. This applies to vaginal, anal or oral sex. Generally, doctors advise a barrier method only for the time you are actually having the treatment and for about a week after your treatment. Advice like this can be worrying, but this does not mean that you have to avoid being intimate with your partner. You can still have close contact with your partner and continue to enjoy sex.  Animals If you have cats or birds we just ask that you not change the litter or change the cage.  Please have someone else do this for you while you are on chemotherapy.   Food Safety During and After Cancer Treatment Food safety is important for people both during and after cancer treatment. Cancer and cancer treatments, such as chemotherapy, radiation therapy, and stem cell/bone marrow transplantation, often weaken the immune system. This makes it harder for your body to protect itself from foodborne illness, also called food  poisoning. Foodborne illness is caused by eating food that contains harmful bacteria, parasites, or viruses.  Foods to avoid Some foods have a higher risk of becoming tainted with bacteria. These include: Unwashed fresh fruit and vegetables, especially leafy vegetables that can hide dirt and other contaminants Raw sprouts, such as alfalfa sprouts Raw or undercooked beef, especially ground beef, or other raw or undercooked meat and poultry Fatty, fried, or spicy foods immediately before or after treatment.  These can sit heavy on your stomach and make you feel nauseous. Raw or undercooked shellfish, such as oysters. Sushi and sashimi, which often contain raw fish.  Unpasteurized beverages, such as unpasteurized fruit juices, raw milk, raw yogurt, or cider Undercooked eggs, such as soft boiled, over easy, and poached; raw, unpasteurized eggs; or foods made with raw egg, such as homemade raw cookie dough and homemade mayonnaise  Simple steps for food safety  Shop smart. Do not buy food stored or  displayed in an unclean area. Do not buy bruised or damaged fruits or vegetables. Do not buy cans that have cracks, dents, or bulges. Pick up foods that can spoil at the end of your shopping trip and store them in a cooler on the way home.  Prepare and clean up foods carefully. Rinse all fresh fruits and vegetables under running water, and dry them with a clean towel or paper towel. Clean the top of cans before opening them. After preparing food, wash your hands for 20 seconds with hot water and soap. Pay special attention to areas between fingers and under nails. Clean your utensils and dishes with hot water and soap. Disinfect your kitchen and cutting boards using 1 teaspoon of liquid, unscented bleach mixed into 1 quart of water.    Dispose of old food. Eat canned and packaged food before its expiration date (the "use by" or "best before" date). Consume refrigerated leftovers within 3 to 4 days.  After that time, throw out the food. Even if the food does not smell or look spoiled, it still may be unsafe. Some bacteria, such as Listeria, can grow even on foods stored in the refrigerator if they are kept for too long.  Take precautions when eating out. At restaurants, avoid buffets and salad bars where food sits out for a long time and comes in contact with many people. Food can become contaminated when someone with a virus, often a norovirus, or another "bug" handles it. Put any leftover food in a "to-go" container yourself, rather than having the server do it. And, refrigerate leftovers as soon as you get home. Choose restaurants that are clean and that are willing to prepare your food as you order it cooked.   AT HOME MEDICATIONS:                                                                                                                                                                Compazine/Prochlorperazine 10mg  tablet. Take 1 tablet every 6 hours as needed for nausea/vomiting. (This can make you sleepy)   EMLA cream. Apply a quarter size amount to port site 1 hour prior to chemo. Do not rub in. Cover with plastic wrap.    Diarrhea Sheet   If you are having loose stools/diarrhea, please purchase Imodium and begin taking as outlined:  At the first sign of poorly formed or loose stools you should begin taking Imodium (loperamide) 2 mg capsules.  Take two tablets (4mg ) followed by one tablet (2mg ) every 2 hours - DO NOT EXCEED 8 tablets in 24 hours.  If it is bedtime and you are having loose stools, take 2 tablets at bedtime, then 2 tablets every 4 hours until morning.   Always call the [facility] if you  are having loose stools/diarrhea that you can't get under control.  Loose stools/diarrhea leads to dehydration (loss of water) in your body.  We have other options of trying to get the loose stools/diarrhea to stop but you must let us know!   Constipation Sheet  Colace - 100  mg capsules - take 2 capsules daily.  If this doesn't help then you can increase to 2 capsules twice daily.  Please call if the above does not work for you. Do not go more than 2 days without a bowel movement.  It is very important that you do not become constipated.  It will make you feel sick to your stomach (nausea) and can cause abdominal pain and vomiting.  Nausea Sheet   Compazine/Prochlorperazine 10mg  tablet. Take 1 tablet every 6 hours as needed for nausea/vomiting (This can make you drowsy).  If you are having persistent nausea (nausea that does not stop) please call the [facility] and let us know the amount of nausea that you are experiencing.  If you begin to vomit, you need to call the [facility] and if it is the weekend and you have vomited more than one time and can't get it to stop-go to the Emergency Room.  Persistent nausea/vomiting can lead to dehydration (loss of fluid in your body) and will make you feel very weak and unwell. Ice chips, sips of clear liquids, foods that are at room temperature, crackers, and toast tend to be better tolerated.   SYMPTOMS TO REPORT AS SOON AS POSSIBLE AFTER TREATMENT:  FEVER GREATER THAN 100.4 F  CHILLS WITH OR WITHOUT FEVER  NAUSEA AND VOMITING THAT IS NOT CONTROLLED WITH YOUR NAUSEA MEDICATION  UNUSUAL SHORTNESS OF BREATH  UNUSUAL BRUISING OR BLEEDING  TENDERNESS IN MOUTH AND THROAT WITH OR WITHOUT   PRESENCE OF ULCERS  URINARY PROBLEMS  BOWEL PROBLEMS  UNUSUAL RASH      Wear comfortable clothing and clothing appropriate for easy access to any Portacath or PICC line. Let us know if there is anything that we can do to make your therapy better!    What to do if you need assistance after hours or on the weekends: CALL [phone].  HOLD on the line, do not hang up.  You will hear multiple messages but at the end you will be connected with a nurse triage line.  They will contact the doctor if necessary.  Most of the time  they will be able to assist you.  Do not call the hospital operator.      I have been informed and understand all of the instructions given to me and have received a copy. I have been instructed to call the clinic [phone] or my family physician as soon as possible for continued medical care, if indicated. I do not have any more questions at this time but understand that I may call the [facility] or the Patient Navigator at [phone] during office hours should I have questions or need assistance in obtaining follow-up care.

## 2021-02-20 NOTE — Progress Notes (Signed)

## 2021-02-23 ENCOUNTER — Other Ambulatory Visit: Payer: Self-pay

## 2021-02-23 ENCOUNTER — Ambulatory Visit (HOSPITAL_COMMUNITY): Payer: Medicaid Other | Admitting: Dietician

## 2021-02-23 ENCOUNTER — Inpatient Hospital Stay (HOSPITAL_COMMUNITY): Payer: Medicaid Other

## 2021-02-23 ENCOUNTER — Inpatient Hospital Stay (HOSPITAL_COMMUNITY): Payer: Medicaid Other | Attending: Hematology

## 2021-02-23 VITALS — BP 108/52 | HR 78 | Temp 96.7°F | Resp 17

## 2021-02-23 DIAGNOSIS — C155 Malignant neoplasm of lower third of esophagus: Secondary | ICD-10-CM

## 2021-02-23 DIAGNOSIS — C159 Malignant neoplasm of esophagus, unspecified: Secondary | ICD-10-CM

## 2021-02-23 DIAGNOSIS — C7951 Secondary malignant neoplasm of bone: Secondary | ICD-10-CM | POA: Insufficient documentation

## 2021-02-23 DIAGNOSIS — Z5112 Encounter for antineoplastic immunotherapy: Secondary | ICD-10-CM | POA: Diagnosis not present

## 2021-02-23 DIAGNOSIS — C772 Secondary and unspecified malignant neoplasm of intra-abdominal lymph nodes: Secondary | ICD-10-CM | POA: Diagnosis not present

## 2021-02-23 DIAGNOSIS — C787 Secondary malignant neoplasm of liver and intrahepatic bile duct: Secondary | ICD-10-CM | POA: Diagnosis not present

## 2021-02-23 DIAGNOSIS — Z5111 Encounter for antineoplastic chemotherapy: Secondary | ICD-10-CM | POA: Insufficient documentation

## 2021-02-23 DIAGNOSIS — Z79899 Other long term (current) drug therapy: Secondary | ICD-10-CM | POA: Diagnosis not present

## 2021-02-23 DIAGNOSIS — K769 Liver disease, unspecified: Secondary | ICD-10-CM

## 2021-02-23 DIAGNOSIS — Z95828 Presence of other vascular implants and grafts: Secondary | ICD-10-CM

## 2021-02-23 LAB — COMPREHENSIVE METABOLIC PANEL
ALT: 43 U/L (ref 0–44)
AST: 32 U/L (ref 15–41)
Albumin: 3.3 g/dL — ABNORMAL LOW (ref 3.5–5.0)
Alkaline Phosphatase: 181 U/L — ABNORMAL HIGH (ref 38–126)
Anion gap: 8 (ref 5–15)
BUN: 16 mg/dL (ref 8–23)
CO2: 25 mmol/L (ref 22–32)
Calcium: 9.3 mg/dL (ref 8.9–10.3)
Chloride: 99 mmol/L (ref 98–111)
Creatinine, Ser: 0.96 mg/dL (ref 0.61–1.24)
GFR, Estimated: >60 mL/min (ref >60)
Glucose, Bld: 119 mg/dL — ABNORMAL HIGH (ref 70–99)
Potassium: 3.6 mmol/L (ref 3.5–5.1)
Sodium: 132 mmol/L — ABNORMAL LOW (ref 135–145)
Total Bilirubin: 0.6 mg/dL (ref 0.3–1.2)
Total Protein: 7.7 g/dL (ref 6.5–8.1)

## 2021-02-23 LAB — CBC WITH DIFFERENTIAL/PLATELET
Abs Immature Granulocytes: 0.04 10*3/uL (ref 0.00–0.07)
Basophils Absolute: 0.1 10*3/uL (ref 0.0–0.1)
Basophils Relative: 1 %
Eosinophils Absolute: 0.5 10*3/uL (ref 0.0–0.5)
Eosinophils Relative: 4 %
HCT: 30 % — ABNORMAL LOW (ref 39.0–52.0)
Hemoglobin: 9.7 g/dL — ABNORMAL LOW (ref 13.0–17.0)
Immature Granulocytes: 0 %
Lymphocytes Relative: 16 %
Lymphs Abs: 1.7 10*3/uL (ref 0.7–4.0)
MCH: 26.7 pg (ref 26.0–34.0)
MCHC: 32.3 g/dL (ref 30.0–36.0)
MCV: 82.6 fL (ref 80.0–100.0)
Monocytes Absolute: 1.2 10*3/uL — ABNORMAL HIGH (ref 0.1–1.0)
Monocytes Relative: 12 %
Neutro Abs: 7.1 10*3/uL (ref 1.7–7.7)
Neutrophils Relative %: 67 %
Platelets: 370 10*3/uL (ref 150–400)
RBC: 3.63 MIL/uL — ABNORMAL LOW (ref 4.22–5.81)
RDW: 16.2 % — ABNORMAL HIGH (ref 11.5–15.5)
WBC: 10.6 10*3/uL — ABNORMAL HIGH (ref 4.0–10.5)
nRBC: 0 % (ref 0.0–0.2)

## 2021-02-23 LAB — MAGNESIUM: Magnesium: 2 mg/dL (ref 1.7–2.4)

## 2021-02-23 MED ORDER — FLUOROURACIL CHEMO INJECTION 2.5 GM/50ML
400.0000 mg/m2 | Freq: Once | INTRAVENOUS | Status: AC
  Administered 2021-02-23: 750 mg via INTRAVENOUS
  Filled 2021-02-23: qty 15

## 2021-02-23 MED ORDER — DEXTROSE 5 % IV SOLN: Freq: Once | INTRAVENOUS | Status: AC

## 2021-02-23 MED ORDER — OXALIPLATIN CHEMO INJECTION 100 MG/20ML
82.0000 mg/m2 | Freq: Once | INTRAVENOUS | Status: AC
  Administered 2021-02-23: 150 mg via INTRAVENOUS
  Filled 2021-02-23: qty 10

## 2021-02-23 MED ORDER — PALONOSETRON HCL INJECTION 0.25 MG/5ML
0.2500 mg | Freq: Once | INTRAVENOUS | Status: AC
  Administered 2021-02-23: 0.25 mg via INTRAVENOUS
  Filled 2021-02-23: qty 5

## 2021-02-23 MED ORDER — HEPARIN SOD (PORK) LOCK FLUSH 100 UNIT/ML IV SOLN: 500.0000 [IU] | Freq: Once | INTRAVENOUS | Status: DC | PRN

## 2021-02-23 MED ORDER — SODIUM CHLORIDE 0.9% FLUSH: 10.0000 mL | INTRAVENOUS | Status: DC | PRN

## 2021-02-23 MED ORDER — SODIUM CHLORIDE 0.9 % IV SOLN
2400.0000 mg/m2 | INTRAVENOUS | Status: DC
  Administered 2021-02-23: 4400 mg via INTRAVENOUS
  Filled 2021-02-23: qty 88

## 2021-02-23 MED ORDER — LEUCOVORIN CALCIUM INJECTION 350 MG
400.0000 mg/m2 | Freq: Once | INTRAVENOUS | Status: AC
  Administered 2021-02-23: 736 mg via INTRAVENOUS
  Filled 2021-02-23: qty 36.8

## 2021-02-23 MED ORDER — SODIUM CHLORIDE 0.9 % IV SOLN
10.0000 mg | Freq: Once | INTRAVENOUS | Status: AC
  Administered 2021-02-23: 10 mg via INTRAVENOUS
  Filled 2021-02-23: qty 10

## 2021-02-23 NOTE — Patient Instructions (Addendum)
[facility]  Discharge Instructions: Thank you for choosing [facility] to provide your oncology and hematology care.  If you have a lab appointment with the [facility], please come in thru the Main Entrance and check in at the main information desk.  Wear comfortable clothing and clothing appropriate for easy access to any Portacath or PICC line.   We strive to give you quality time with your provider. You may need to reschedule your appointment if you arrive late (15 or more minutes).  Arriving late affects you and other patients whose appointments are after yours.  Also, if you miss three or more appointments without notifying the office, you may be dismissed from the clinic at the provider's discretion.      For prescription refill requests, have your pharmacy contact our office and allow 72 hours for refills to be completed.      To help prevent nausea and vomiting after your treatment, we encourage you to take your nausea medication as directed.  BELOW ARE SYMPTOMS THAT SHOULD BE REPORTED IMMEDIATELY: *FEVER GREATER THAN 100.4 F (38 C) OR HIGHER *CHILLS OR SWEATING *NAUSEA AND VOMITING THAT IS NOT CONTROLLED WITH YOUR NAUSEA MEDICATION *UNUSUAL SHORTNESS OF BREATH *UNUSUAL BRUISING OR BLEEDING *URINARY PROBLEMS (pain or burning when urinating, or frequent urination) *BOWEL PROBLEMS (unusual diarrhea, constipation, pain near the anus) TENDERNESS IN MOUTH AND THROAT WITH OR WITHOUT PRESENCE OF ULCERS (sore throat, sores in mouth, or a toothache) UNUSUAL RASH, SWELLING OR PAIN  UNUSUAL VAGINAL DISCHARGE OR ITCHING   Items with * indicate a potential emergency and should be followed up as soon as possible or go to the Emergency Department if any problems should occur.  Please show the CHEMOTHERAPY ALERT CARD or IMMUNOTHERAPY ALERT CARD at check-in to the Emergency Department and triage nurse.  The chemotherapy medication bag should finish at 46 hours, 96  hours, or 7 days. For example, if your pump is scheduled for 46 hours and it was put on at 4:00 p.m., it should finish at 2:00 p.m. the day it is scheduled to come off regardless of your appointment time.     Estimated time to finish at 1145.   If the display on your pump reads "Low Volume" and it is beeping, take the batteries out of the pump and come to the cancer center for it to be taken off.   If the pump alarms go off prior to the pump reading "Low Volume" then call [phone] and someone can assist you.  If the plunger comes out and the chemotherapy medication is leaking out, please use your home chemo spill kit to clean up the spill. Do NOT use paper towels or other household products.  If you have problems or questions regarding your pump, please call either [phone] (24 hours a day) or the cancer center Monday-Friday 8:00 a.m.- 4:30 p.m. at the clinic number and we will assist you. If you are unable to get assistance, then go to the nearest Emergency Department and ask the staff to contact the IV team for assistance.   Should you have questions after your visit or need to cancel or reschedule your appointment, please contact [facility] [phone]  and follow the prompts.  Office hours are 8:00 a.m. to 4:30 p.m. Monday - Friday. Please note that voicemails left after 4:00 p.m. may not be returned until the following business day.  We are closed weekends and major holidays. You have access to a  nurse at all times for urgent questions. Please call the main number to the clinic [phone] and follow the prompts.  For any non-urgent questions, you may also contact your provider using MyChart. We now offer e-Visits for anyone 60 and older to request care online for non-urgent symptoms. For details visit [email or web address].   Also download the MyChart app! Go to the app store, search "MyChart", open the app, select [facility], and log in with your MyChart username  and password.  Due to Covid, a mask is required upon entering the hospital/clinic. If you do not have a mask, one will be given to you upon arrival. For doctor visits, patients may have 1 support person aged 54 or older with them. For treatment visits, patients cannot have anyone with them due to current Covid guidelines and our immunocompromised population.

## 2021-02-23 NOTE — Progress Notes (Signed)
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 He completed concurrent chemoradiation therapy 07/14/16. Patient is completing day 1/cycle 1 FOLFOX.   Met with patient during infusion. Patient denies cold sensitivity, nausea, vomiting, diarrhea, constipation. He reports appetite is not as good as it used to be, states sometimes food doesn't taste right. He reports usually eating 3 small meals/day. Patient likes to snack on fruit (bananas, oranges) Yesterday he had a couple of eggs, oranges, grapes and tea for breakfast, chicken salad sandwich with glass of milk for lunch, frozen dinner (hamburger steak, creamed potatoes). He is going to the store this afternoon to get bread for peanut butter sandwiches. He used to drink Ensure, but has not in a couple of years.    Nutrition Focused Physical Exam:  -Moderate fat depletion to orbital and thoracic regions -Moderate muscle depletion to temple, clavicle, dorsal hand regions   Medications: Norco, Compazine   Labs: Na 132, Glucose 119   Anthropometrics: Weight 152 lb today decreased 5% (3.2%) from 157 lb on 9/16. This is significant for time frame.   Height: 5'10" Weight: 152 lb  UBW: 170 lb 6.4 oz (08/05/20) BMI: 21.81   NUTRITION DIAGNOSIS: Unintentional weight loss related to cancer as evidenced by reported decreased appetite, significant 3.2% weight loss in 2 weeks and 10.6% weight loss in 6.5 months  MALNUTRITION DIAGNOSIS: Patient meets criteria for moderate malnutrition in the context of chronic illness as evidenced by moderate fat and muscle depletions on exam and 10.6% (18 lb) weight loss in the last 6.5 months.    INTERVENTION:  Educated on importance of adequate calories and protein to maintain weights, strength, nutrition Encouraged small frequent meals and snacks - handout with snack ideas given Encouraged having bedtime  snack  Suggested drinking nutrition supplement for added calories/protein, recommended 2 Ensure Plus/equivalent daily Complimentary case of Ensure Plus provided today Reviewed cold sensitivity side effects of chemotherapy, suggested keeping supplements at room temperature vs in the fridge Contact information provided   MONITORING, EVALUATION, GOAL: Patient will tolerate increased calories and protein to promote weight gain/stability   Next Visit: Monday, October 17 during infusion

## 2021-02-23 NOTE — Progress Notes (Signed)
Patients port flushed without difficulty.  Good blood return noted with no bruising or swelling noted at site.  Stable during access and blood draw.  Patient to remain accessed for treatment. 

## 2021-02-23 NOTE — Progress Notes (Signed)
Patient presents today for day 1/cycle 1 of Folfox infusion.  Patient is in satisfactory condition with no new complaints today.  Vital signs are stable.  Labs reviewed and all labs are within treatment parameters.  We will proceed with treatment per MD orders.    Patient tolerated treatment well with no complaints voiced.  Education done on home infusion pump.  Patient verbalized understanding.  Patient left ambulatory in stable condition.  Vital signs stable at discharge.  Follow up as scheduled.

## 2021-02-25 ENCOUNTER — Encounter (HOSPITAL_COMMUNITY): Payer: Self-pay

## 2021-02-25 ENCOUNTER — Inpatient Hospital Stay (HOSPITAL_COMMUNITY): Payer: Medicaid Other

## 2021-02-25 VITALS — BP 101/66 | HR 99 | Temp 96.8°F | Resp 18

## 2021-02-25 DIAGNOSIS — Z5112 Encounter for antineoplastic immunotherapy: Secondary | ICD-10-CM | POA: Diagnosis not present

## 2021-02-25 DIAGNOSIS — C155 Malignant neoplasm of lower third of esophagus: Secondary | ICD-10-CM

## 2021-02-25 DIAGNOSIS — Z95828 Presence of other vascular implants and grafts: Secondary | ICD-10-CM

## 2021-02-25 MED ORDER — HEPARIN SOD (PORK) LOCK FLUSH 100 UNIT/ML IV SOLN
500.0000 [IU] | Freq: Once | INTRAVENOUS | Status: AC | PRN
  Administered 2021-02-25: 500 [IU]

## 2021-02-25 MED ORDER — SODIUM CHLORIDE 0.9% FLUSH
10.0000 mL | INTRAVENOUS | Status: DC | PRN
  Administered 2021-02-25: 10 mL

## 2021-02-25 NOTE — Patient Instructions (Signed)
[facility]  Discharge Instructions: Thank you for choosing [facility] to provide your oncology and hematology care.  If you have a lab appointment with the [facility], please come in thru the Main Entrance and check in at the main information desk.  Wear comfortable clothing and clothing appropriate for easy access to any Portacath or PICC line.   We strive to give you quality time with your provider. You may need to reschedule your appointment if you arrive late (15 or more minutes).  Arriving late affects you and other patients whose appointments are after yours.  Also, if you miss three or more appointments without notifying the office, you may be dismissed from the clinic at the provider's discretion.      For prescription refill requests, have your pharmacy contact our office and allow 72 hours for refills to be completed.    Today you received the following chemotherapy and/or immunotherapy agents Adrucil/5FU.       To help prevent nausea and vomiting after your treatment, we encourage you to take your nausea medication as directed.  BELOW ARE SYMPTOMS THAT SHOULD BE REPORTED IMMEDIATELY: *FEVER GREATER THAN 100.4 F (38 C) OR HIGHER *CHILLS OR SWEATING *NAUSEA AND VOMITING THAT IS NOT CONTROLLED WITH YOUR NAUSEA MEDICATION *UNUSUAL SHORTNESS OF BREATH *UNUSUAL BRUISING OR BLEEDING *URINARY PROBLEMS (pain or burning when urinating, or frequent urination) *BOWEL PROBLEMS (unusual diarrhea, constipation, pain near the anus) TENDERNESS IN MOUTH AND THROAT WITH OR WITHOUT PRESENCE OF ULCERS (sore throat, sores in mouth, or a toothache) UNUSUAL RASH, SWELLING OR PAIN  UNUSUAL VAGINAL DISCHARGE OR ITCHING   Items with * indicate a potential emergency and should be followed up as soon as possible or go to the Emergency Department if any problems should occur.  Please show the CHEMOTHERAPY ALERT CARD or IMMUNOTHERAPY ALERT CARD at check-in to the Emergency  Department and triage nurse.  Should you have questions after your visit or need to cancel or reschedule your appointment, please contact [facility] [phone]  and follow the prompts.  Office hours are 8:00 a.m. to 4:30 p.m. Monday - Friday. Please note that voicemails left after 4:00 p.m. may not be returned until the following business day.  We are closed weekends and major holidays. You have access to a nurse at all times for urgent questions. Please call the main number to the clinic [phone] and follow the prompts.  For any non-urgent questions, you may also contact your provider using MyChart. We now offer e-Visits for anyone 4 and older to request care online for non-urgent symptoms. For details visit [email or web address].   Also download the MyChart app! Go to the app store, search "MyChart", open the app, select [facility], and log in with your MyChart username and password.  Due to Covid, a mask is required upon entering the hospital/clinic. If you do not have a mask, one will be given to you upon arrival. For doctor visits, patients may have 1 support person aged 8 or older with them. For treatment visits, patients cannot have anyone with them due to current Covid guidelines and our immunocompromised population. Stanley Jimenez presents to have home infusion pump d/c'd and for port-a-cath deaccess with flush. Portacath flushed with NS and 500U/22ml Heparin, and needle removed intact.  Procedure tolerated well and without incident.

## 2021-02-25 NOTE — Progress Notes (Signed)
Patient presents today for pump d/c. Vital signs are stable. Port a cath site clean, dry, and intact. Port flushed with 10 mls of Normal Saline and 500 Units of Heparin. Needle removed intact. Band aid applied. Patient has no complaints at this time. Discharged from clinic ambulatory and in stable condition. Patient alert and oriented.  

## 2021-02-26 ENCOUNTER — Telehealth (HOSPITAL_COMMUNITY): Payer: Self-pay | Admitting: *Deleted

## 2021-02-26 NOTE — Telephone Encounter (Signed)
Pt called for 24 hour chemotherapy follow-up. Pt denies any side effects at this time. Pt stated he felt fair pt advised to call the clinic if needed.

## 2021-03-07 NOTE — Progress Notes (Signed)
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  Gleason 3+3 = 6 pattern.   08/14/2014 Initial Diagnosis   Malignant neoplasm of prostate [facility])    - 09/20/2014 Radiation Therapy   Brachytherapy with I-125   Malignant tumor of lower third of esophagus ([facility])  04/14/2016 Procedure   EGD by Dr. Laural Golden.   04/16/2016 Pathology Results   Esophagus, biopsy - INVASIVE POORLY DIFFERENTIATED SQUAMOUS CELL CARCINOMA.   04/26/2016 Imaging   CT CAP- 1. Prominent gastroesophageal junction without discrete esophageal mass. Notably, there is also a borderline enlarged enhancing lymph node in the gastrohepatic ligament immediately adjacent to the gastroesophageal junction, which is nonspecific, but could indicate early nodal disease. 2. The small cluster of low-attenuation lesions in segment 7 of the liver are too small to characterize. Statistically, these are likely to represent cysts. If there is clinical concern for metastatic disease to the liver, these could be further characterized with MRI of the abdomen with and without IV gadolinium at this time. 3. No other potential sites of metastatic disease noted elsewhere in the chest, abdomen or pelvis.   05/10/2016 PET scan   Hypermetabolic bilateral lower thoracic esophageal mass, consistent with known primary esophageal carcinoma.  Small  hypermetabolic lymph node in the upper gastrohepatic ligament adjacent to GE junction, consistent with lymph node metastasis.  No other sites of metastatic disease identified.   05/31/2016 Procedure   Port placed by Dr. Rosana Hoes   06/02/2016 - 07/14/2016 Chemotherapy   Weekly Carboplatin/Paclitaxel with XRT.  Tolerated extremely well.    06/02/2016 - 07/14/2016 Radiation Therapy   [place].  Tolerated well.   08/23/2016 PET scan   1. Complete metabolic response. No residual hypermetabolism in the thoracic esophagus or in the upper gastrohepatic ligament lymph node, which is decreased in size. 2. Aortic atherosclerosis.  Three-vessel coronary atherosclerosis.   08/24/2016 Miscellaneous   CTS consult with Dr. Servando Snare- "I've recommended to him that surgical resection offers the best chance of cure, if he waits and there is recurrent disease remaining no longer be a surgical candidate. From his discussion today I think he is not inclined to agree but it has been offered."   03/09/2017 Imaging   CT CAP: 1. Mild distal esophageal and GE junction thickening, which appears slightly more prominent compared with PET-CT 08/20/2016. Correlation with PET-CT suggested. 2. Negative for pulmonary nodules or metastatic disease to the chest. 3. Stable subcentimeter posterior right hepatic lobe hypodensities. No definite evidence for metastatic disease to the abdomen or pelvis   04/28/2017 PET scan   No evidence of recurrent or metastatic carcinoma.   02/23/2021 -  Chemotherapy   Patient is on Treatment Plan : GASTROESOPHAGEAL FOLFOX q14d x 6 cycles       CANCER STAGING: Cancer Staging Malignant tumor of lower third of esophagus ([facility]) Staging form: Esophagus - Squamous Cell Carcinoma, AJCC 8th Edition - Clinical stage from 06/15/2016: Stage Unknown (cTX, cN1, cM0,  L: Lower) - Signed by Kefalas, Thomas S, PA-C on 06/15/2016 - Pathologic stage from 02/17/2021: Stage IVB (rpTX, pN3, pM1, G3) -  Unsigned   INTERVAL HISTORY:  [name], a 65 y.o. male, returns for routine follow-up and consideration for next cycle of chemotherapy. Stanley Jimenez was last seen on 02/17/2021.  Due for cycle #2 of FOLFOX today.   Overall, he tells me he has been feeling pretty well. He reports continued right side CP which was not helped with two 5-325 mg tablets of Hydrocodone every 6 hours. He denies n/v/d/c, tingling/numbness, trouble swallowing, and cold sensitivity. His appetite is okay, and he is not drinking Boost/Ensure. He denies history of autoimmune disorders, Crohn's disease, and RA. He reports occasional back pain upon standing.   Overall, he feels ready for next cycle of chemo today.   REVIEW OF SYSTEMS:  Review of Systems  Constitutional:  Negative for appetite change and fatigue.  HENT:   Negative for trouble swallowing.   Respiratory:  Positive for shortness of breath.   Cardiovascular:  Positive for chest pain (R side).  Gastrointestinal:  Negative for constipation, diarrhea, nausea and vomiting.  Musculoskeletal:  Positive for back pain.  Neurological:  Negative for numbness.  Psychiatric/Behavioral:  Positive for sleep disturbance.   All other systems reviewed and are negative.  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HCC)    Clubbing of fingers    congenital   Depression    Full dentures    History of PSVT (paroxysmal supraventricular tachycardia)    run of non-sustatined VT 03-10-2015 in setting of alcohol withdrawal in hospital   History of seizure    03-08-2012  alcohol withdrawal   History of thrombocytopenia    10/ 2013  in setting of alcohol withdrawal   Hyperlipidemia    Hypertension    Port-A-Cath in place 02/20/2021   Prostate cancer (HCC) urologist-  dr dalhstedt/  oncologist- dr manning   T1c, Gleason 3+3,  PSA 8.89,  vol  27cc   Psoriasis, 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   Seizures (HCC)    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   Past Surgical History:  Procedure Laterality Date   BIOPSY  04/14/2016   Procedure: BIOPSY;  Surgeon: Najeeb U Rehman, MD;  [facility];  Service: Endoscopy;;  esophagus   COLONOSCOPY N/A 04/14/2016   Procedure: COLONOSCOPY;  Surgeon: Najeeb U Rehman, MD;  [facility];  Service: Endoscopy;  Laterality: N/A;   ESOPHAGOGASTRODUODENOSCOPY N/A 04/14/2016   Procedure: ESOPHAGOGASTRODUODENOSCOPY (EGD);  Surgeon: Najeeb U Rehman, MD;  Location[facility];  Service: Endoscopy;  Laterality: N/A;  1:00   NO PAST SURGERIES     PORTACATH PLACEMENT Right 05/31/2016   Procedure: INSERTION OF TUNNELED RIGHT INTERNAL JUGULAR BARD POWERPORT CENTRAL VENOUS CATHETER WITH SUBCUTANEOUS PORT;  Surgeon: Jason Evan Davis, MD;  Location: [facility];  Service: Vascular;  Laterality: Right;   RADIOACTIVE SEED IMPLANT N/A 09/20/2014   Procedure: RADIOACTIVE SEED IMPLANT;  Surgeon: Stephen Dahlstedt, MD;  Location: [facility];  Service: Urology;  Laterality: N/A;    77   seeds implanted no seeds found in bladder   TRANSURETHRAL RESECTION OF PROSTATE      SOCIAL HISTORY:  Social History     Socioeconomic History   Marital status: Single    Spouse name: Not on file   Number of children: Not on file   Years of education: Not on file   Highest education level: Not on file  Occupational History   Not on file  Tobacco Use   Smoking status: Former    Packs/day: 0.50    Years: 20.00    Pack years: 10.00    Types: Cigarettes    Quit date: 11/12/2020    Years since quitting: 0.3   Smokeless tobacco: Former    Types: Chew    Quit date: 05/27/1984   Tobacco comments:    only smoking a few  Vaping Use   Vaping Use: Never used  Substance and Sexual Activity   Alcohol use: Not on file    Comment: quit  drinking in October 2015. He says before this he says he stayed drunk every day.    Drug use: No    Comment: last use october 2015   Sexual activity: Never    Birth control/protection: None  Other Topics Concern   Not on file  Social History Narrative   Not on file   Social Determinants of Health   Financial Resource Strain: Not on file  Food Insecurity: Not on file  Transportation Needs: Not on file  Physical Activity: Not on file  Stress: Not on file  Social Connections: Not on file  Intimate Partner Violence: Not on file    FAMILY HISTORY:  Family History  Problem Relation Age of Onset   Alcohol abuse Mother    Diabetes Sister     CURRENT MEDICATIONS:  Current Outpatient Medications  Medication Sig Dispense Refill   amLODipine (NORVASC) 10 MG tablet TAKE 1 TABLET BY MOUTH DAILY 90 tablet 1   aspirin EC 81 MG tablet Take 1 tablet (81 mg total) by mouth daily. 90 tablet 3   buPROPion (WELLBUTRIN XL) 150 MG 24 hr tablet TAKE 1 TABLET(150 MG) BY MOUTH DAILY 30 tablet 2   fluorouracil CALGB 63785 2,400 mg/m2 in sodium chloride 0.9 % 150 mL Inject 2,400 mg/m2 into the vein over 48 hr.     FLUOROURACIL IV Inject into the vein every 14 (fourteen) days.     HYDROcodone-acetaminophen (NORCO) 5-325 MG tablet Take 1 tablet by mouth every 6 (six) hours as needed for moderate pain. 120 tablet 0   LEUCOVORIN CALCIUM IV Inject into the vein every 14 (fourteen) days.     OXALIPLATIN IV Inject into the vein every 14 (fourteen) days.     rosuvastatin (CRESTOR) 20 MG tablet TAKE 1 TABLET(20 MG) BY MOUTH DAILY 30 tablet 3   tamsulosin (FLOMAX) 0.4 MG CAPS capsule TAKE 1 CAPSULE(0.4 MG) BY MOUTH DAILY 90 capsule 1   triamcinolone cream (KENALOG) 0.1 % APPLY TO THE AFFECTED AREA TWICE DAILY 454 g 0   lidocaine-prilocaine (EMLA) cream Apply a small amount to port a cath site and cover with plastic wrap 1 hour prior to chemotherapy appointments 30 g 3   prochlorperazine (COMPAZINE) 10 MG tablet  Take 1 tablet (10 mg total) by mouth every 6 (six) hours as needed (Nausea or vomiting). 30 tablet 1   No current facility-administered medications for this visit.    ALLERGIES:  No Known Allergies  PHYSICAL EXAM:  Performance status (ECOG): 1 - Symptomatic but completely ambulatory  There were no vitals filed for this visit. Wt Readings from Last 3 Encounters:  03/09/21 151 lb 6.4 oz (68.7 kg)  02/23/21  152 lb (68.9 kg)  02/17/21 151 lb 6.4 oz (68.7 kg)   Physical Exam Vitals reviewed.  Constitutional:      Appearance: Normal appearance.  Cardiovascular:     Rate and Rhythm: Normal rate and regular rhythm.     Pulses: Normal pulses.     Heart sounds: Normal heart sounds.  Pulmonary:     Effort: Pulmonary effort is normal.     Breath sounds: Normal breath sounds.  Neurological:     General: No focal deficit present.     Mental Status: He is alert and oriented to person, place, and time.  Psychiatric:        Mood and Affect: Mood normal.        Behavior: Behavior normal.    LABORATORY DATA:  I have reviewed the labs as listed.  CBC Latest Ref Rng & Units 03/09/2021 02/23/2021 02/06/2021  WBC 4.0 - 10.5 K/uL 5.6 10.6(H) 7.5  Hemoglobin 13.0 - 17.0 g/dL 8.9(L) 9.7(L) 12.6(L)  Hematocrit 39.0 - 52.0 % 27.4(L) 30.0(L) 38.8(L)  Platelets 150 - 400 K/uL 268 370 292   CMP Latest Ref Rng & Units 02/23/2021 01/16/2021 11/21/2020  Glucose 70 - 99 mg/dL 119(H) 94 93  BUN 8 - 23 mg/dL _0 Creatinine 0.61 - 1.24 mg/dL 0.96 0.94 1.17  Sodium 135 - 145 mmol/L 132(L) 135 140  Potassium 3.5 - 5.1 mmol/L 3.6 4.1 4.7  Chloride 98 - 111 mmol/L 99 103 104  CO2 22 - 32 mmol/L _1 Calcium 8.9 - 10.3 mg/dL 9.3 9.3 9.3  Total Protein 6.5 - 8.1 g/dL 7.7 7.8 6.9  Total Bilirubin 0.3 - 1.2 mg/dL 0.6 0.4 0.5  Alkaline Phos 38 - 126 U/L 181(H) 135(H) 126(H)  AST 15 - 41 U/L 32 29 23  ALT 0 - 44 U/L 43 27 24    DIAGNOSTIC IMAGING:  I have independently reviewed the scans and  discussed with the patient. No results found.   ASSESSMENT:  1.  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 - I have reviewed images of the CT [facility] with contrast from 01/16/2021 which showed mild subjective thickening at the GE junction without defined mass.  There is extensive hepatic metastasis, upper abdominal and retroperitoneal adenopathy and widespread metastatic disease to the bones. - Liver lesion biopsy on 02/06/2021 consistent with high-grade poorly differentiated carcinoma (adeno squamous carcinoma). - Caris testing PD-L1 CPS 40%.  HER2 was negative.  MSI is stable.  TMB was low.  Other targetable mutations were negative.   2.  Social/family history: - He lives at home by himself and is independent of ADLs and IADLs.  He worked in [employer] in the past.  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  His sister lives close by. - He smoked half pack per day for 48 years and quit around 29-Nov-2020. - 2 of his sisters died of cancer.  Patient does not know the type.   PLAN:  1.  Metastatic cancer to the liver, retroperitoneal lymph nodes and bones: - He tolerated cycle 1 of FOLFOX reasonably well. - He did not have any GI symptoms. - Caris testing showed PD-L1 CPS 40%. - We discussed about adding nivolumab to chemotherapy regimen. - We  discussed side effects of nivolumab in detail.  He does not have any history of autoimmune problems. - I have reviewed his labs which showed slightly elevated ALT.  CBC was grossly normal.  He will proceed with cycle 2 today. - RTC 2 weeks for follow-up with repeat labs and treatment.   2.  Chest wall and back pain: - He reports that he is taking  hydrocodone 1 to 2 tablets every 4 hours as needed which is not helping. - We will change his pain medication to Percocet 10/325 every 6 hours as needed.  3.  Weight loss: - His weight has been stable in the last 2 weeks. - He is eating better.  We will consider appetite stimulants if he loses weight.  4.  Normocytic anemia: - Anemia from chronic inflammation and malignancy.  Previous work-up for nutritional deficiencies and SPEP were negative. - We will plan to repeat ferritin, iron panel and B12 and folic acid at next visit.    Orders placed this encounter:  No orders of the defined types were placed in this encounter.  Total time spent is 40 minutes with more than 50% of the time spent face-to-face discussing new test results, modify treatment plan, adverse effects, counseling and coordination of care.  Sreedhar Katragadda, MD [facility] [phone]   I, Kirstyn Evans, am acting as a scribe for Dr. Sreedhar Katragadda.  I, Sreedhar Katragadda MD, have reviewed the above documentation for accuracy and completeness, and I agree with the above.     

## 2021-03-09 ENCOUNTER — Other Ambulatory Visit (HOSPITAL_COMMUNITY): Payer: Self-pay

## 2021-03-09 ENCOUNTER — Inpatient Hospital Stay (HOSPITAL_COMMUNITY): Payer: Medicaid Other

## 2021-03-09 ENCOUNTER — Inpatient Hospital Stay (HOSPITAL_BASED_OUTPATIENT_CLINIC_OR_DEPARTMENT_OTHER): Payer: Medicaid Other | Admitting: Hematology

## 2021-03-09 ENCOUNTER — Inpatient Hospital Stay (HOSPITAL_COMMUNITY): Payer: Medicaid Other | Admitting: Dietician

## 2021-03-09 VITALS — BP 127/66 | HR 83 | Temp 97.5°F | Resp 18

## 2021-03-09 DIAGNOSIS — C159 Malignant neoplasm of esophagus, unspecified: Secondary | ICD-10-CM

## 2021-03-09 DIAGNOSIS — Z5112 Encounter for antineoplastic immunotherapy: Secondary | ICD-10-CM | POA: Diagnosis not present

## 2021-03-09 DIAGNOSIS — K769 Liver disease, unspecified: Secondary | ICD-10-CM | POA: Diagnosis not present

## 2021-03-09 DIAGNOSIS — C155 Malignant neoplasm of lower third of esophagus: Secondary | ICD-10-CM

## 2021-03-09 DIAGNOSIS — Z95828 Presence of other vascular implants and grafts: Secondary | ICD-10-CM

## 2021-03-09 LAB — CBC WITH DIFFERENTIAL/PLATELET
Abs Immature Granulocytes: 0.02 10*3/uL (ref 0.00–0.07)
Basophils Absolute: 0.1 10*3/uL (ref 0.0–0.1)
Basophils Relative: 1 %
Eosinophils Absolute: 0.2 10*3/uL (ref 0.0–0.5)
Eosinophils Relative: 3 %
HCT: 27.4 % — ABNORMAL LOW (ref 39.0–52.0)
Hemoglobin: 8.9 g/dL — ABNORMAL LOW (ref 13.0–17.0)
Immature Granulocytes: 0 %
Lymphocytes Relative: 28 %
Lymphs Abs: 1.6 10*3/uL (ref 0.7–4.0)
MCH: 26.6 pg (ref 26.0–34.0)
MCHC: 32.5 g/dL (ref 30.0–36.0)
MCV: 81.8 fL (ref 80.0–100.0)
Monocytes Absolute: 1.1 10*3/uL — ABNORMAL HIGH (ref 0.1–1.0)
Monocytes Relative: 19 %
Neutro Abs: 2.8 10*3/uL (ref 1.7–7.7)
Neutrophils Relative %: 49 %
Platelets: 268 10*3/uL (ref 150–400)
RBC: 3.35 MIL/uL — ABNORMAL LOW (ref 4.22–5.81)
RDW: 17 % — ABNORMAL HIGH (ref 11.5–15.5)
WBC: 5.6 10*3/uL (ref 4.0–10.5)
nRBC: 0 % (ref 0.0–0.2)

## 2021-03-09 LAB — COMPREHENSIVE METABOLIC PANEL
ALT: 48 U/L — ABNORMAL HIGH (ref 0–44)
AST: 38 U/L (ref 15–41)
Albumin: 3.3 g/dL — ABNORMAL LOW (ref 3.5–5.0)
Alkaline Phosphatase: 172 U/L — ABNORMAL HIGH (ref 38–126)
Anion gap: 5 (ref 5–15)
BUN: 13 mg/dL (ref 8–23)
CO2: 26 mmol/L (ref 22–32)
Calcium: 8.5 mg/dL — ABNORMAL LOW (ref 8.9–10.3)
Chloride: 101 mmol/L (ref 98–111)
Creatinine, Ser: 0.76 mg/dL (ref 0.61–1.24)
GFR, Estimated: >60 mL/min (ref >60)
Glucose, Bld: 95 mg/dL (ref 70–99)
Potassium: 4 mmol/L (ref 3.5–5.1)
Sodium: 132 mmol/L — ABNORMAL LOW (ref 135–145)
Total Bilirubin: 0.3 mg/dL (ref 0.3–1.2)
Total Protein: 7.3 g/dL (ref 6.5–8.1)

## 2021-03-09 LAB — MAGNESIUM: Magnesium: 2.3 mg/dL (ref 1.7–2.4)

## 2021-03-09 MED ORDER — OXYCODONE-ACETAMINOPHEN 10-325 MG PO TABS: 1.0000 | ORAL_TABLET | Freq: Four times a day (QID) | ORAL | 0 refills | Status: DC | PRN

## 2021-03-09 MED ORDER — FLUOROURACIL CHEMO INJECTION 5 GM/100ML
2400.0000 mg/m2 | INTRAVENOUS | Status: DC
  Administered 2021-03-09: 4400 mg via INTRAVENOUS
  Filled 2021-03-09: qty 88

## 2021-03-09 MED ORDER — SODIUM CHLORIDE 0.9 % IV SOLN: INTRAVENOUS | Status: DC

## 2021-03-09 MED ORDER — DEXTROSE 5 % IV SOLN: Freq: Once | INTRAVENOUS | Status: AC

## 2021-03-09 MED ORDER — LEUCOVORIN CALCIUM INJECTION 350 MG
400.0000 mg/m2 | Freq: Once | INTRAVENOUS | Status: AC
  Administered 2021-03-09: 736 mg via INTRAVENOUS
  Filled 2021-03-09: qty 36.8

## 2021-03-09 MED ORDER — OXALIPLATIN CHEMO INJECTION 100 MG/20ML
82.0000 mg/m2 | Freq: Once | INTRAVENOUS | Status: AC
  Administered 2021-03-09: 150 mg via INTRAVENOUS
  Filled 2021-03-09: qty 10

## 2021-03-09 MED ORDER — SODIUM CHLORIDE 0.9 % IV SOLN
240.0000 mg | Freq: Once | INTRAVENOUS | Status: AC
  Administered 2021-03-09: 240 mg via INTRAVENOUS
  Filled 2021-03-09: qty 24

## 2021-03-09 MED ORDER — PALONOSETRON HCL INJECTION 0.25 MG/5ML
0.2500 mg | Freq: Once | INTRAVENOUS | Status: AC
  Administered 2021-03-09: 0.25 mg via INTRAVENOUS
  Filled 2021-03-09: qty 5

## 2021-03-09 MED ORDER — SODIUM CHLORIDE 0.9 % IV SOLN
10.0000 mg | Freq: Once | INTRAVENOUS | Status: AC
  Administered 2021-03-09: 10 mg via INTRAVENOUS
  Filled 2021-03-09: qty 10

## 2021-03-09 MED ORDER — FLUOROURACIL CHEMO INJECTION 2.5 GM/50ML
400.0000 mg/m2 | Freq: Once | INTRAVENOUS | Status: AC
  Administered 2021-03-09: 750 mg via INTRAVENOUS
  Filled 2021-03-09: qty 15

## 2021-03-09 NOTE — Patient Instructions (Signed)
[place]  Discharge Instructions: Thank you for choosing [facility] to provide your oncology and hematology care.  If you have a lab appointment with the [facility], please come in thru the Main Entrance and check in at the main information desk.  Wear comfortable clothing and clothing appropriate for easy access to any Portacath or PICC line.   We strive to give you quality time with your provider. You may need to reschedule your appointment if you arrive late (15 or more minutes).  Arriving late affects you and other patients whose appointments are after yours.  Also, if you miss three or more appointments without notifying the office, you may be dismissed from the clinic at the provider's discretion.      For prescription refill requests, have your pharmacy contact our office and allow 72 hours for refills to be completed.    Today you received the following chemotherapy and/or immunotherapy agents Folfox and Opdivo.   To help prevent nausea and vomiting after your treatment, we encourage you to take your nausea medication as directed.  The chemotherapy medication bag should finish at 46 hours, 96 hours, or 7 days. For example, if your pump is scheduled for 46 hours and it was put on at 4:00 p.m., it should finish at 2:00 p.m. the day it is scheduled to come off regardless of your appointment time.     Estimated time to finish at 1210 pm.   If the display on your pump reads "Low Volume" and it is beeping, take the batteries out of the pump and come to the cancer center for it to be taken off.   If the pump alarms go off prior to the pump reading "Low Volume" then call [phone] and someone can assist you.  If the plunger comes out and the chemotherapy medication is leaking out, please use your home chemo spill kit to clean up the spill. Do NOT use paper towels or other household products.  If you have problems or questions regarding your pump, please  call either [phone] (24 hours a day) or the cancer center Monday-Friday 8:00 a.m.- 4:30 p.m. at the clinic number and we will assist you. If you are unable to get assistance, then go to the nearest Emergency Department and ask the staff to contact the IV team for assistance.    BELOW ARE SYMPTOMS THAT SHOULD BE REPORTED IMMEDIATELY: *FEVER GREATER THAN 100.4 F (38 C) OR HIGHER *CHILLS OR SWEATING *NAUSEA AND VOMITING THAT IS NOT CONTROLLED WITH YOUR NAUSEA MEDICATION *UNUSUAL SHORTNESS OF BREATH *UNUSUAL BRUISING OR BLEEDING *URINARY PROBLEMS (pain or burning when urinating, or frequent urination) *BOWEL PROBLEMS (unusual diarrhea, constipation, pain near the anus) TENDERNESS IN MOUTH AND THROAT WITH OR WITHOUT PRESENCE OF ULCERS (sore throat, sores in mouth, or a toothache) UNUSUAL RASH, SWELLING OR PAIN  UNUSUAL VAGINAL DISCHARGE OR ITCHING   Items with * indicate a potential emergency and should be followed up as soon as possible or go to the Emergency Department if any problems should occur.  Please show the CHEMOTHERAPY ALERT CARD or IMMUNOTHERAPY ALERT CARD at check-in to the Emergency Department and triage nurse.  Should you have questions after your visit or need to cancel or reschedule your appointment, please contact [facility] [phone]  and follow the prompts.  Office hours are 8:00 a.m. to 4:30 p.m. Monday - Friday. Please note that voicemails left after 4:00 p.m. may not be returned until the following business  day.  We are closed weekends and major holidays. You have access to a nurse at all times for urgent questions. Please call the main number to the clinic [phone] and follow the prompts.  For any non-urgent questions, you may also contact your provider using MyChart. We now offer e-Visits for anyone 2 and older to request care online for non-urgent symptoms. For details visit [email or web address].   Also download the MyChart app! Go to the app  store, search "MyChart", open the app, select , and log in with your MyChart username and password.  Due to Covid, a mask is required upon entering the hospital/clinic. If you do not have a mask, one will be given to you upon arrival. For doctor visits, patients may have 1 support person aged 32 or older with them. For treatment visits, patients cannot have anyone with them due to current Covid guidelines and our immunocompromised population.

## 2021-03-09 NOTE — Progress Notes (Signed)
Nutrition Follow-up:  Patient with metastatic SCC of esophagus. He completed concurrent chemoradiation therapy 07/14/16. Patient currently receiving FOLFOX.   Met with patient during infusion. He reports tolerating treatment well. Patient reports good appetite. Patient appreciative of handouts given to him at last visit, reports hanging high protein snack ideas in his kitchen. Yesterday, recalls boiled egg, peanut butter/banana sandwich, spaghetti, grapes, 2 tangerines, and 1 Ensure. He is having pintos tonight for dinner. Patient reports regular bowel movements daily. He denies nutrition impact symptoms.  Medications: reviewed  Labs: Na 132  Anthropometrics: Weight 151 lb 6.4 oz today stable x 2 weeks. Last weight 152 lb on 10/3 decreased from 157 lb on 9/16.   NUTRITION DIAGNOSIS: Unintentional weight loss stable    MALNUTRITION DIAGNOSIS: Moderate malnutrition ongoing   INTERVENTION:  Continue strategies for increasing calories and protein with small frequent meals and snacks Continue drinking 1-2 Ensure Plus/equivalent daily - pt reports he is not in need of additional complimentary case or coupons today Patient has contact information    MONITORING, EVALUATION, GOAL: weight trends, intake   NEXT VISIT: To be scheduled

## 2021-03-09 NOTE — Progress Notes (Signed)
Patient has been seen and examined by Dr. Delton Coombes. Labs reviewed by Dr. Delton Coombes. Okay to proceed with treatment today. Patient to receive Opdivo this cycle if insurance authorization allows. Primary RN and Pharmacy made aware.

## 2021-03-09 NOTE — Progress Notes (Signed)
Pt presents today for Folfox and Opdivo per provider's order. Dr. Raliegh Ip wants to add Opdivo 240 mg to this patient's treatment plan today. Vital signs and labs WNL for treatment today. Okay to proceed with treatment per Dr. Zola Button and Armida Sans  given today per MD orders. Tolerated infusion without adverse affects. Vital signs stable. No complaints at this time. Discharged from clinic ambulatory in stable condition. Alert and oriented x 3. F/U with [facility] as scheduled. 5FU ambulatory pump infusing.

## 2021-03-11 ENCOUNTER — Other Ambulatory Visit: Payer: Self-pay

## 2021-03-11 ENCOUNTER — Encounter (HOSPITAL_COMMUNITY): Payer: Self-pay

## 2021-03-11 ENCOUNTER — Inpatient Hospital Stay (HOSPITAL_COMMUNITY): Payer: Medicaid Other

## 2021-03-11 VITALS — BP 121/69 | HR 85 | Temp 97.2°F | Resp 18

## 2021-03-11 DIAGNOSIS — Z5112 Encounter for antineoplastic immunotherapy: Secondary | ICD-10-CM | POA: Diagnosis not present

## 2021-03-11 DIAGNOSIS — Z95828 Presence of other vascular implants and grafts: Secondary | ICD-10-CM

## 2021-03-11 DIAGNOSIS — C155 Malignant neoplasm of lower third of esophagus: Secondary | ICD-10-CM

## 2021-03-11 MED ORDER — HEPARIN SOD (PORK) LOCK FLUSH 100 UNIT/ML IV SOLN
500.0000 [IU] | Freq: Once | INTRAVENOUS | Status: AC | PRN
  Administered 2021-03-11: 500 [IU]

## 2021-03-11 MED ORDER — SODIUM CHLORIDE 0.9% FLUSH
10.0000 mL | INTRAVENOUS | Status: DC | PRN
  Administered 2021-03-11: 10 mL

## 2021-03-11 NOTE — Patient Instructions (Signed)
[facility]  Discharge Instructions: Thank you for choosing [facility] to provide your oncology and hematology care.  If you have a lab appointment with the [facility], please come in thru the Main Entrance and check in at the main information desk.  Wear comfortable clothing and clothing appropriate for easy access to any Portacath or PICC line.   We strive to give you quality time with your provider. You may need to reschedule your appointment if you arrive late (15 or more minutes).  Arriving late affects you and other patients whose appointments are after yours.  Also, if you miss three or more appointments without notifying the office, you may be dismissed from the clinic at the provider's discretion.      For prescription refill requests, have your pharmacy contact our office and allow 72 hours for refills to be completed.    Today your chemo pump was disconnected and port was flushed. Return as scheduled.    To help prevent nausea and vomiting after your treatment, we encourage you to take your nausea medication as directed.  BELOW ARE SYMPTOMS THAT SHOULD BE REPORTED IMMEDIATELY: *FEVER GREATER THAN 100.4 F (38 C) OR HIGHER *CHILLS OR SWEATING *NAUSEA AND VOMITING THAT IS NOT CONTROLLED WITH YOUR NAUSEA MEDICATION *UNUSUAL SHORTNESS OF BREATH *UNUSUAL BRUISING OR BLEEDING *URINARY PROBLEMS (pain or burning when urinating, or frequent urination) *BOWEL PROBLEMS (unusual diarrhea, constipation, pain near the anus) TENDERNESS IN MOUTH AND THROAT WITH OR WITHOUT PRESENCE OF ULCERS (sore throat, sores in mouth, or a toothache) UNUSUAL RASH, SWELLING OR PAIN  UNUSUAL VAGINAL DISCHARGE OR ITCHING   Items with * indicate a potential emergency and should be followed up as soon as possible or go to the Emergency Department if any problems should occur.  Please show the CHEMOTHERAPY ALERT CARD or IMMUNOTHERAPY ALERT CARD at check-in to the Emergency Department  and triage nurse.  Should you have questions after your visit or need to cancel or reschedule your appointment, please contact [facility] [phone]  and follow the prompts.  Office hours are 8:00 a.m. to 4:30 p.m. Monday - Friday. Please note that voicemails left after 4:00 p.m. may not be returned until the following business day.  We are closed weekends and major holidays. You have access to a nurse at all times for urgent questions. Please call the main number to the clinic [phone] and follow the prompts.  For any non-urgent questions, you may also contact your provider using MyChart. We now offer e-Visits for anyone 68 and older to request care online for non-urgent symptoms. For details visit [email or web address].   Also download the MyChart app! Go to the app store, search "MyChart", open the app, select [facility], and log in with your MyChart username and password.  Due to Covid, a mask is required upon entering the hospital/clinic. If you do not have a mask, one will be given to you upon arrival. For doctor visits, patients may have 1 support person aged 25 or older with them. For treatment visits, patients cannot have anyone with them due to current Covid guidelines and our immunocompromised population.

## 2021-03-11 NOTE — Progress Notes (Signed)
Chemo pump disconnected. Port flushed with good blood return noted. No bruising or swelling at site. Bandaid applied and patient discharged in satisfactory condition. VVS stable with no signs or symptoms of distressed noted. 

## 2021-03-17 ENCOUNTER — Encounter: Payer: Self-pay | Admitting: Family Medicine

## 2021-03-17 ENCOUNTER — Other Ambulatory Visit: Payer: Self-pay

## 2021-03-17 ENCOUNTER — Ambulatory Visit (INDEPENDENT_AMBULATORY_CARE_PROVIDER_SITE_OTHER): Payer: Medicaid Other | Admitting: Family Medicine

## 2021-03-17 VITALS — BP 116/68 | HR 93 | Resp 17 | Ht 70.0 in | Wt 147.1 lb

## 2021-03-17 DIAGNOSIS — Z23 Encounter for immunization: Secondary | ICD-10-CM

## 2021-03-17 DIAGNOSIS — Z0000 Encounter for general adult medical examination without abnormal findings: Secondary | ICD-10-CM | POA: Insufficient documentation

## 2021-03-17 NOTE — Assessment & Plan Note (Signed)

## 2021-03-17 NOTE — Progress Notes (Signed)
   Stanley Jimenez     MRN: [id number]      DOB: October 25, 1955   HPI: Patient is in for annual physical exam. No other health concerns are expressed or addressed at the visit. Recent labs, if available are reviewed. Immunization is reviewed , and  updated .    PE; BP 116/68   Pulse 93   Resp 17   Ht 5\' 10"  (1.778 m)   Wt 147 lb 1.3 oz (66.7 kg)   SpO2 94%   BMI 21.10 kg/m   Pleasant male, alert and oriented x 3, in no cardio-pulmonary distress.bitemporal wasting, chronically ill appearing Afebrile. HEENT No facial trauma or asymetry. Sinuses non tender. EOMI External ears normal,  Neck: supple, left anterior cervical adenopathy,J no VD or thyromegaly.No bruits.  Chest: Clear to ascultation bilaterally.No crackles or wheezes. Non tender to palpation  Cardiovascular system; Heart sounds normal,  S1 and  S2 ,no S3.  No murmur, or thrill. Apical beat not displaced Peripheral pulses normal.  Abdomen: Soft, non tender, hepatomegaly, no  masses. No bruits. Bowel sounds normal. No guarding, LLQ mildly tender, no rebound  Musculoskeletal exam: Adequate ROM of spine, hips , shoulders and knees. No deformity ,swelling or crepitus noted. No muscle wasting or atrophy.   Neurologic: Cranial nerves 2 to 12 intact. Power, tone ,sensation and reflexes normal throughout. No disturbance in gait. No tremor.  Skin: Intact, no ulceration, erythema , scaling or rash noted. Pigmentation normal throughout  Psych; Normal mood and affect. Judgement and concentration normal   Assessment & Plan:  Encounter for annual physical exam Annual exam as documented. Counseling done  re healthy lifestyle involving commitment to 150 minutes exercise per week, heart healthy diet, and attaining healthy weight.The importance of adequate sleep also discussed. Regular seat belt use and home safety, is also discussed. Changes in health habits are decided on by the patient with goals and time frames   set for achieving them. Immunization and cancer screening needs are specifically addressed at this visit.

## 2021-03-17 NOTE — Patient Instructions (Signed)
F/u in  early March, call if you need me sooner  Flu vaccine today  Stop crestor when you finish the last 4 tabs  Stop aspirin  Wellbutrin take 3 times wekly for next 2 weeks , then stop  All the best!  Thanks for choosing [facility], we consider it a privelige to serve you.

## 2021-03-19 ENCOUNTER — Encounter (HOSPITAL_COMMUNITY): Payer: Self-pay

## 2021-03-21 NOTE — Progress Notes (Signed)
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  Gleason 3+3 = 6 pattern.   08/14/2014 Initial Diagnosis   Malignant neoplasm of prostate [facility])    - 09/20/2014 Radiation Therapy   Brachytherapy with I-125   Malignant tumor of lower third of esophagus ([facility])  04/14/2016 Procedure   EGD by Dr. Laural Golden.   04/16/2016 Pathology Results   Esophagus, biopsy - INVASIVE POORLY DIFFERENTIATED SQUAMOUS CELL CARCINOMA.   04/26/2016 Imaging   CT CAP- 1. Prominent gastroesophageal junction without discrete esophageal mass. Notably, there is also a borderline enlarged enhancing lymph node in the gastrohepatic ligament immediately adjacent to the gastroesophageal junction, which is nonspecific, but could indicate early nodal disease. 2. The small cluster of low-attenuation lesions in segment 7 of the liver are too small to characterize. Statistically, these are likely to represent cysts. If there is clinical concern for metastatic disease to the liver, these could be further characterized with MRI of the abdomen with and without IV gadolinium at this time. 3. No other potential sites of metastatic disease noted elsewhere in the chest, abdomen or pelvis.   05/10/2016 PET scan   Hypermetabolic bilateral lower thoracic esophageal mass, consistent with known primary esophageal carcinoma.  Small  hypermetabolic lymph node in the upper gastrohepatic ligament adjacent to GE junction, consistent with lymph node metastasis.  No other sites of metastatic disease identified.   05/31/2016 Procedure   Port placed by Dr. Rosana Hoes   06/02/2016 - 07/14/2016 Chemotherapy   Weekly Carboplatin/Paclitaxel with XRT.  Tolerated extremely well.    06/02/2016 - 07/14/2016 Radiation Therapy   [place], [place].  Tolerated well.   08/23/2016 PET scan   1. Complete metabolic response. No residual hypermetabolism in the thoracic esophagus or in the upper gastrohepatic ligament lymph node, which is decreased in size. 2. Aortic atherosclerosis.  Three-vessel coronary atherosclerosis.   08/24/2016 Miscellaneous   CTS consult with Dr. Servando Snare- "I've recommended to him that surgical resection offers the best chance of cure, if he waits and there is recurrent disease remaining no longer be a surgical candidate. From his discussion today I think he is not inclined to agree but it has been offered."   03/09/2017 Imaging   CT CAP: 1. Mild distal esophageal and GE junction thickening, which appears slightly more prominent compared with PET-CT 08/20/2016. Correlation with PET-CT suggested. 2. Negative for pulmonary nodules or metastatic disease to the chest. 3. Stable subcentimeter posterior right hepatic lobe hypodensities. No definite evidence for metastatic disease to the abdomen or pelvis   04/28/2017 PET scan   No evidence of recurrent or metastatic carcinoma.   02/23/2021 -  Chemotherapy   Patient is on Treatment Plan : GASTROESOPHAGEAL FOLFOX q14d x 6 cycles       CANCER STAGING: Cancer Staging Malignant tumor of lower third of esophagus ([facility]) Staging form: Esophagus - Squamous Cell Carcinoma, AJCC 8th Edition - Clinical stage from 06/15/2016: Stage Unknown (cTX, cN1, cM0,  L: Lower) - Signed by Baird Cancer, PA-C on 06/15/2016 - Pathologic stage from 02/17/2021: Stage IVB (rpTX, pN3, pM1, G3) -  Unsigned   INTERVAL HISTORY:  Stanley Jimenez, a 65 y.o. male, returns for routine follow-up and consideration for next cycle of chemotherapy. Stanley Jimenez was last seen on 03/09/2021.  Due for cycle #3 of FOLFOX today.   Overall, he tells me he has been feeling pretty well. He reports his appetite is fair, and he is drinking 1-3 Ensures daily. The pain in his right side has improved with 4 oxycodone daily, and he reports constipation.  He denies cold sensitivity, n/v/d, mouth sores, leg swellings, and tingling/numbness.  Overall, he feels ready for next cycle of chemo today.   REVIEW OF SYSTEMS:  Review of Systems  Constitutional:  Negative for appetite change (50%) and fatigue (50%).  HENT:   Negative for mouth sores.   Cardiovascular:  Negative for leg swelling.  Gastrointestinal:  Positive for constipation. Negative for diarrhea, nausea and vomiting.  Musculoskeletal:  Positive for flank pain (4/10 R side).  Neurological:  Positive for dizziness and headaches. Negative for numbness.  Psychiatric/Behavioral:  Positive for depression.   All other systems reviewed and are negative.  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HCC)    Clubbing of fingers    congenital   Depression    Full dentures    History of PSVT (paroxysmal supraventricular tachycardia)    run of non-sustatined VT 03-10-2015 in setting of alcohol withdrawal in hospital   History of seizure    03-08-2012  alcohol withdrawal   History of thrombocytopenia    10/ 2013  in setting of alcohol withdrawal   Hyperlipidemia    Hypertension    Port-A-Cath in place 02/20/2021   Prostate cancer [facility]) urologist-  dr dalhstedt/  oncologist- dr Tammi Klippel   T1c, Gleason 3+3,  PSA 8.89,  vol 27cc   Psoriasis, 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   Seizures ([facility])    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 but none since stopped  drinking in 2016   Squamous cell carcinoma of esophagus ([facility]) 04/21/2016   Past Surgical History:  Procedure Laterality Date   BIOPSY  04/14/2016   Procedure: BIOPSY;  Surgeon: Rogene Houston, MD;  Location: [facility];  Service: Endoscopy;;  esophagus   COLONOSCOPY N/A 04/14/2016   Procedure: COLONOSCOPY;  Surgeon: Rogene Houston, MD;  Location: [facility];  Service: Endoscopy;  Laterality: N/A;   ESOPHAGOGASTRODUODENOSCOPY N/A 04/14/2016   Procedure: ESOPHAGOGASTRODUODENOSCOPY (EGD);  Surgeon: Rogene Houston, MD;  Location: [facility];  Service: Endoscopy;  Laterality: N/A;  1:00   NO PAST SURGERIES     PORTACATH PLACEMENT Right 05/31/2016   Procedure: INSERTION OF TUNNELED RIGHT INTERNAL JUGULAR BARD POWERPORT CENTRAL VENOUS CATHETER WITH SUBCUTANEOUS PORT;  Surgeon: Vickie Epley, MD;  Location: [facility] ORS;  Service: Vascular;  Laterality: Right;   RADIOACTIVE SEED IMPLANT N/A 09/20/2014   Procedure: RADIOACTIVE SEED IMPLANT;  Surgeon: Franchot Gallo, MD;  Location: [facility];  Service: Urology;  Laterality: N/A;    26   seeds implanted no seeds found in bladder   TRANSURETHRAL RESECTION OF PROSTATE      SOCIAL HISTORY:  Social History   Socioeconomic History   Marital status: Single    Spouse name: Not on file  Number of children: Not on file   Years of education: Not on file   Highest education level: Not on file  Occupational History   Not on file  Tobacco Use   Smoking status: Former    Packs/day: 0.50    Years: 20.00    Pack years: 10.00    Types: Cigarettes    Quit date: 11/12/2020    Years since quitting: 0.3   Smokeless tobacco: Former    Types: Chew    Quit date: 05/27/1984   Tobacco comments:    only smoking a few  Vaping Use   Vaping Use: Never used  Substance and Sexual Activity   Alcohol use: Not on file    Comment: quit drinking in October 2015. He says before this he says he stayed drunk every day.    Drug use: No     Comment: last use october 2015   Sexual activity: Never    Birth control/protection: None  Other Topics Concern   Not on file  Social History Narrative   Not on file   Social Determinants of Health   Financial Resource Strain: Not on file  Food Insecurity: Not on file  Transportation Needs: Not on file  Physical Activity: Not on file  Stress: Not on file  Social Connections: Not on file  Intimate Partner Violence: Not on file    FAMILY HISTORY:  Family History  Problem Relation Age of Onset   Alcohol abuse Mother    Diabetes Sister     CURRENT MEDICATIONS:  Current Outpatient Medications  Medication Sig Dispense Refill   amLODipine (NORVASC) 10 MG tablet TAKE 1 TABLET BY MOUTH DAILY 90 tablet 1   fluorouracil CALGB 30865 2,400 mg/m2 in sodium chloride 0.9 % 150 mL Inject 2,400 mg/m2 into the vein over 48 hr.     FLUOROURACIL IV Inject into the vein every 14 (fourteen) days.     LEUCOVORIN CALCIUM IV Inject into the vein every 14 (fourteen) days.     lidocaine-prilocaine (EMLA) cream Apply a small amount to port a cath site and cover with plastic wrap 1 hour prior to chemotherapy appointments 30 g 3   OXALIPLATIN IV Inject into the vein every 14 (fourteen) days.     tamsulosin (FLOMAX) 0.4 MG CAPS capsule TAKE 1 CAPSULE(0.4 MG) BY MOUTH DAILY 90 capsule 1   triamcinolone cream (KENALOG) 0.1 % APPLY TO THE AFFECTED AREA TWICE DAILY 454 g 0   oxyCODONE-acetaminophen (PERCOCET) 10-325 MG tablet Take 1 tablet by mouth every 6 (six) hours as needed for pain. (Patient not taking: No sig reported) 60 tablet 0   prochlorperazine (COMPAZINE) 10 MG tablet Take 1 tablet (10 mg total) by mouth every 6 (six) hours as needed (Nausea or vomiting). (Patient not taking: No sig reported) 30 tablet 1   No current facility-administered medications for this visit.    ALLERGIES:  No Known Allergies  PHYSICAL EXAM:  Performance status (ECOG): 1 - Symptomatic but completely ambulatory  There  were no vitals filed for this visit. Wt Readings from Last 3 Encounters:  03/23/21 146 lb 3.2 oz (66.3 kg)  03/17/21 147 lb 1.3 oz (66.7 kg)  03/09/21 151 lb 6.4 oz (68.7 kg)   Physical Exam Vitals reviewed.  Constitutional:      Appearance: Normal appearance.  Cardiovascular:     Rate and Rhythm: Normal rate and regular rhythm.     Pulses: Normal pulses.     Heart sounds: Normal heart sounds.  Pulmonary:  Effort: Pulmonary effort is normal.     Breath sounds: Normal breath sounds.  Neurological:     General: No focal deficit present.     Mental Status: He is alert and oriented to person, place, and time.  Psychiatric:        Mood and Affect: Mood normal.        Behavior: Behavior normal.    LABORATORY DATA:  I have reviewed the labs as listed.  CBC Latest Ref Rng & Units 03/23/2021 03/09/2021 02/23/2021  WBC 4.0 - 10.5 K/uL 5.3 5.6 10.6(H)  Hemoglobin 13.0 - 17.0 g/dL 8.8(L) 8.9(L) 9.7(L)  Hematocrit 39.0 - 52.0 % 27.9(L) 27.4(L) 30.0(L)  Platelets 150 - 400 K/uL 227 268 370   CMP Latest Ref Rng & Units 03/23/2021 03/09/2021 02/23/2021  Glucose 70 - 99 mg/dL 126(H) 95 119(H)  BUN 8 - 23 mg/dL _0 Creatinine 0.61 - 1.24 mg/dL 0.83 0.76 0.96  Sodium 135 - 145 mmol/L 132(L) 132(L) 132(L)  Potassium 3.5 - 5.1 mmol/L 3.7 4.0 3.6  Chloride 98 - 111 mmol/L 99 101 99  CO2 22 - 32 mmol/L _1 Calcium 8.9 - 10.3 mg/dL 8.7(L) 8.5(L) 9.3  Total Protein 6.5 - 8.1 g/dL 7.2 7.3 7.7  Total Bilirubin 0.3 - 1.2 mg/dL 0.6 0.3 0.6  Alkaline Phos 38 - 126 U/L 159(H) 172(H) 181(H)  AST 15 - 41 U/L 34 38 32  ALT 0 - 44 U/L 31 48(H) 43    DIAGNOSTIC IMAGING:  I have independently reviewed the scans and discussed with the patient. No results found.   ASSESSMENT:  1.  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 - I have reviewed images of the CT [facility] with contrast from 01/16/2021 which showed mild subjective thickening at the GE junction without defined mass.  There is extensive hepatic metastasis, upper abdominal and retroperitoneal adenopathy and widespread metastatic disease to the bones. - Liver lesion biopsy on 02/06/2021 consistent with high-grade poorly differentiated carcinoma (adeno squamous carcinoma). - Caris testing PD-L1 CPS 40%.  HER2 was negative.  MSI is stable.  TMB was low.  Other targetable mutations were negative.   2.  Social/family history: - He lives at home by himself and is independent of ADLs and IADLs.  He worked in [employer] in the past.  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  His sister lives close by. - He smoked half pack per day for 48 years and quit around 12/07/20. - 2 of his sisters died of cancer.  Patient does not know the type.   PLAN:  1.  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 - He did not have any immunotherapy related side effects.  No cold sensitivity. - No nausea or vomiting.  He had some constipation. - Reviewed labs which showed normal LFTs and CBC. - Proceed with cycle 3 today with nivolumab. - RTC 2 weeks for follow-up with repeat labs.   2.  Chest wall and back pain: - He reports right lateral rib pain is better controlled at this time. - Continue Percocet 10/325 every 6 hours as needed.  3.  Weight loss: - His weight  is stable in the last 2 weeks. - Continue Ensure 1 to 3 cans/day.  He is also eating better.  4.  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 - Q65, folic acid were normal.  Ferritin was 969 with percent saturation of 6. - Hemoglobin today is 8.8.  Will consider transfusion as  needed.   Orders placed this encounter:  No orders of the defined types were placed in this encounter.    Derek Jack, MD [facility] [phone]   I, Thana Ates, am acting as a scribe for Dr. Derek Jack.  I, Derek Jack MD, have reviewed the above documentation for accuracy and completeness, and I agree with the above.

## 2021-03-23 ENCOUNTER — Inpatient Hospital Stay (HOSPITAL_COMMUNITY): Payer: Medicaid Other

## 2021-03-23 ENCOUNTER — Inpatient Hospital Stay (HOSPITAL_BASED_OUTPATIENT_CLINIC_OR_DEPARTMENT_OTHER): Payer: Medicaid Other | Admitting: Hematology

## 2021-03-23 ENCOUNTER — Other Ambulatory Visit: Payer: Self-pay

## 2021-03-23 VITALS — BP 119/66 | HR 83 | Temp 97.7°F | Resp 18

## 2021-03-23 DIAGNOSIS — K769 Liver disease, unspecified: Secondary | ICD-10-CM

## 2021-03-23 DIAGNOSIS — Z5112 Encounter for antineoplastic immunotherapy: Secondary | ICD-10-CM | POA: Diagnosis not present

## 2021-03-23 DIAGNOSIS — C159 Malignant neoplasm of esophagus, unspecified: Secondary | ICD-10-CM

## 2021-03-23 DIAGNOSIS — Z95828 Presence of other vascular implants and grafts: Secondary | ICD-10-CM

## 2021-03-23 DIAGNOSIS — C155 Malignant neoplasm of lower third of esophagus: Secondary | ICD-10-CM

## 2021-03-23 LAB — CBC WITH DIFFERENTIAL/PLATELET
Abs Immature Granulocytes: 0.02 10*3/uL (ref 0.00–0.07)
Basophils Absolute: 0 10*3/uL (ref 0.0–0.1)
Basophils Relative: 0 %
Eosinophils Absolute: 0.1 10*3/uL (ref 0.0–0.5)
Eosinophils Relative: 1 %
HCT: 27.9 % — ABNORMAL LOW (ref 39.0–52.0)
Hemoglobin: 8.8 g/dL — ABNORMAL LOW (ref 13.0–17.0)
Immature Granulocytes: 0 %
Lymphocytes Relative: 21 %
Lymphs Abs: 1.1 10*3/uL (ref 0.7–4.0)
MCH: 25.6 pg — ABNORMAL LOW (ref 26.0–34.0)
MCHC: 31.5 g/dL (ref 30.0–36.0)
MCV: 81.1 fL (ref 80.0–100.0)
Monocytes Absolute: 1.2 10*3/uL — ABNORMAL HIGH (ref 0.1–1.0)
Monocytes Relative: 23 %
Neutro Abs: 2.9 10*3/uL (ref 1.7–7.7)
Neutrophils Relative %: 55 %
Platelets: 227 10*3/uL (ref 150–400)
RBC: 3.44 MIL/uL — ABNORMAL LOW (ref 4.22–5.81)
RDW: 18.1 % — ABNORMAL HIGH (ref 11.5–15.5)
WBC: 5.3 10*3/uL (ref 4.0–10.5)
nRBC: 0 % (ref 0.0–0.2)

## 2021-03-23 LAB — COMPREHENSIVE METABOLIC PANEL
ALT: 31 U/L (ref 0–44)
AST: 34 U/L (ref 15–41)
Albumin: 3.3 g/dL — ABNORMAL LOW (ref 3.5–5.0)
Alkaline Phosphatase: 159 U/L — ABNORMAL HIGH (ref 38–126)
Anion gap: 9 (ref 5–15)
BUN: 19 mg/dL (ref 8–23)
CO2: 24 mmol/L (ref 22–32)
Calcium: 8.7 mg/dL — ABNORMAL LOW (ref 8.9–10.3)
Chloride: 99 mmol/L (ref 98–111)
Creatinine, Ser: 0.83 mg/dL (ref 0.61–1.24)
GFR, Estimated: >60 mL/min (ref >60)
Glucose, Bld: 126 mg/dL — ABNORMAL HIGH (ref 70–99)
Potassium: 3.7 mmol/L (ref 3.5–5.1)
Sodium: 132 mmol/L — ABNORMAL LOW (ref 135–145)
Total Bilirubin: 0.6 mg/dL (ref 0.3–1.2)
Total Protein: 7.2 g/dL (ref 6.5–8.1)

## 2021-03-23 LAB — FOLATE: Folate: 29.4 ng/mL (ref >5.9)

## 2021-03-23 LAB — FERRITIN: Ferritin: 969 ng/mL — ABNORMAL HIGH (ref 24–336)

## 2021-03-23 LAB — IRON AND TIBC
Iron: 18 ug/dL — ABNORMAL LOW (ref 45–182)
Saturation Ratios: 6 % — ABNORMAL LOW (ref 17.9–39.5)
TIBC: 309 ug/dL (ref 250–450)
UIBC: 291 ug/dL

## 2021-03-23 LAB — MAGNESIUM: Magnesium: 2.2 mg/dL (ref 1.7–2.4)

## 2021-03-23 LAB — VITAMIN B12: Vitamin B-12: 4395 pg/mL — ABNORMAL HIGH (ref 180–914)

## 2021-03-23 MED ORDER — OXALIPLATIN CHEMO INJECTION 100 MG/20ML
82.0000 mg/m2 | Freq: Once | INTRAVENOUS | Status: AC
  Administered 2021-03-23: 150 mg via INTRAVENOUS
  Filled 2021-03-23: qty 20

## 2021-03-23 MED ORDER — SODIUM CHLORIDE 0.9 % IV SOLN
2400.0000 mg/m2 | INTRAVENOUS | Status: DC
  Administered 2021-03-23: 4400 mg via INTRAVENOUS
  Filled 2021-03-23: qty 88

## 2021-03-23 MED ORDER — SODIUM CHLORIDE 0.9 % IV SOLN
10.0000 mg | Freq: Once | INTRAVENOUS | Status: AC
  Administered 2021-03-23: 10 mg via INTRAVENOUS
  Filled 2021-03-23: qty 10

## 2021-03-23 MED ORDER — SODIUM CHLORIDE 0.9 % IV SOLN
240.0000 mg | Freq: Once | INTRAVENOUS | Status: AC
  Administered 2021-03-23: 240 mg via INTRAVENOUS
  Filled 2021-03-23: qty 24

## 2021-03-23 MED ORDER — DEXTROSE 5 % IV SOLN: Freq: Once | INTRAVENOUS | Status: AC

## 2021-03-23 MED ORDER — SODIUM CHLORIDE 0.9 % IV SOLN: INTRAVENOUS | Status: DC

## 2021-03-23 MED ORDER — FLUOROURACIL CHEMO INJECTION 2.5 GM/50ML
400.0000 mg/m2 | Freq: Once | INTRAVENOUS | Status: AC
  Administered 2021-03-23: 750 mg via INTRAVENOUS
  Filled 2021-03-23: qty 15

## 2021-03-23 MED ORDER — SODIUM CHLORIDE 0.9% FLUSH: 10.0000 mL | INTRAVENOUS | Status: DC | PRN

## 2021-03-23 MED ORDER — DEXTROSE 5 % IV SOLN
400.0000 mg/m2 | Freq: Once | INTRAVENOUS | Status: AC
  Administered 2021-03-23: 736 mg via INTRAVENOUS
  Filled 2021-03-23: qty 36.8

## 2021-03-23 MED ORDER — PALONOSETRON HCL INJECTION 0.25 MG/5ML
0.2500 mg | Freq: Once | INTRAVENOUS | Status: AC
  Administered 2021-03-23: 0.25 mg via INTRAVENOUS
  Filled 2021-03-23: qty 5

## 2021-03-23 NOTE — Progress Notes (Signed)
Patients port flushed without difficulty.  Good blood return noted with no bruising or swelling noted at site.  Patient remains accessed for chemotherapy treatment.  Patient tolerated port flush well with no complaints.

## 2021-03-23 NOTE — Progress Notes (Signed)
Patient has been examined, vital signs and labs have been reviewed by Dr. Katragadda. ANC, Creatinine, LFTs, hemoglobin, and platelets are within treatment parameters per Dr. Katragadda. Patient may proceed with treatment per M.D.   

## 2021-03-23 NOTE — Progress Notes (Signed)
Pt here for FOLOX plus opdivo.  Labs and Vitals signs WNL for treatment today. Good for treatment today per Dr Raliegh Ip.  Opdivo consent obtained.   Tolerated treatment well today.  Stable during and after treatment.  Vital signs stable prior to discharge.  AVS reviewed.  Discharged in stable condition ambulatory with ambulatory 5FU pump.

## 2021-03-23 NOTE — Patient Instructions (Signed)
[place] at [facility] Discharge Instructions  You were seen and examined today by Dr. Delton Coombes.  You will receive your third cycle of treatment today.  Start taking stool softener (Colace) 1 pill twice a day to help with constipation and make it easier to move your bowels.   Return as scheduled for lab work and treatment.    Thank you for choosing [place] at [facility] to provide your oncology and hematology care.  To afford each patient quality time with our provider, please arrive at least 15 minutes before your scheduled appointment time.   If you have a lab appointment with the [facility] please come in thru the Main Entrance and check in at the main information desk.  You need to re-schedule your appointment should you arrive 10 or more minutes late.  We strive to give you quality time with our providers, and arriving late affects you and other patients whose appointments are after yours.  Also, if you no show three or more times for appointments you may be dismissed from the clinic at the providers discretion.     Again, thank you for choosing [facility].  Our hope is that these requests will decrease the amount of time that you wait before being seen by our physicians.       _____________________________________________________________  Should you have questions after your visit to [facility], please contact our office at [phone] and follow the prompts.  Our office hours are 8:00 a.m. and 4:30 p.m. Monday - Friday.  Please note that voicemails left after 4:00 p.m. may not be returned until the following business day.  We are closed weekends and major holidays.  You do have access to a nurse 24-7, just call the main number to the clinic [phone] and do not press any options, hold on the line and a nurse will answer the phone.    For prescription refill requests, have your pharmacy contact our  office and allow 72 hours.    Due to Covid, you will need to wear a mask upon entering the hospital. If you do not have a mask, a mask will be given to you at the Main Entrance upon arrival. For doctor visits, patients may have 1 support person age 73 or older with them. For treatment visits, patients can not have anyone with them due to social distancing guidelines and our immunocompromised population.

## 2021-03-23 NOTE — Patient Instructions (Addendum)
[facility]  Discharge Instructions: Thank you for choosing [facility] to provide your oncology and hematology care.  If you have a lab appointment with the [facility], please come in thru the Main Entrance and check in at the main information desk.  Wear comfortable clothing and clothing appropriate for easy access to any Portacath or PICC line.   We strive to give you quality time with your provider. You may need to reschedule your appointment if you arrive late (15 or more minutes).  Arriving late affects you and other patients whose appointments are after yours.  Also, if you miss three or more appointments without notifying the office, you may be dismissed from the clinic at the provider's discretion.      For prescription refill requests, have your pharmacy contact our office and allow 72 hours for refills to be completed.    Today you received the following chemotherapy and/or immunotherapy agents opdivo and FOLFOX      To help prevent nausea and vomiting after your treatment, we encourage you to take your nausea medication as directed.  BELOW ARE SYMPTOMS THAT SHOULD BE REPORTED IMMEDIATELY: *FEVER GREATER THAN 100.4 F (38 C) OR HIGHER *CHILLS OR SWEATING *NAUSEA AND VOMITING THAT IS NOT CONTROLLED WITH YOUR NAUSEA MEDICATION *UNUSUAL SHORTNESS OF BREATH *UNUSUAL BRUISING OR BLEEDING *URINARY PROBLEMS (pain or burning when urinating, or frequent urination) *BOWEL PROBLEMS (unusual diarrhea, constipation, pain near the anus) TENDERNESS IN MOUTH AND THROAT WITH OR WITHOUT PRESENCE OF ULCERS (sore throat, sores in mouth, or a toothache) UNUSUAL RASH, SWELLING OR PAIN  UNUSUAL VAGINAL DISCHARGE OR ITCHING   Items with * indicate a potential emergency and should be followed up as soon as possible or go to the Emergency Department if any problems should occur.  Please show the CHEMOTHERAPY ALERT CARD or IMMUNOTHERAPY ALERT CARD at check-in to the  Emergency Department and triage nurse.  Should you have questions after your visit or need to cancel or reschedule your appointment, please contact [facility] [phone]  and follow the prompts.  Office hours are 8:00 a.m. to 4:30 p.m. Monday - Friday. Please note that voicemails left after 4:00 p.m. may not be returned until the following business day.  We are closed weekends and major holidays. You have access to a nurse at all times for urgent questions. Please call the main number to the clinic [phone] and follow the prompts.  For any non-urgent questions, you may also contact your provider using MyChart. We now offer e-Visits for anyone 80 and older to request care online for non-urgent symptoms. For details visit [email or web address].   Also download the MyChart app! Go to the app store, search "MyChart", open the app, select [facility], and log in with your MyChart username and password.  Due to Covid, a mask is required upon entering the hospital/clinic. If you do not have a mask, one will be given to you upon arrival. For doctor visits, patients may have 1 support person aged 52 or older with them. For treatment visits, patients cannot have anyone with them due to current Covid guidelines and our immunocompromised population.   Nivolumab injection What is this medication? NIVOLUMAB (nye VOL ue mab) is a monoclonal antibody. It treats certain types of cancer. Some of the cancers treated are colon cancer, head and neck cancer, Hodgkin lymphoma, lung cancer, and melanoma. This medicine may be used for other purposes; ask your health care provider or pharmacist if you  have questions. COMMON BRAND NAME(S): Opdivo What should I tell my care team before I take this medication? They need to know if you have any of these conditions: autoimmune diseases like Crohn's disease, ulcerative colitis, or lupus have had or planning to have an allogeneic stem cell transplant (uses  someone else's stem cells) history of chest radiation history of organ transplant nervous system problems like myasthenia gravis or Guillain-Barre syndrome an unusual or allergic reaction to nivolumab, other medicines, foods, dyes, or preservatives pregnant or trying to get pregnant breast-feeding How should I use this medication? This medicine is for infusion into a vein. It is given by a health care professional in a hospital or clinic setting. A special MedGuide will be given to you before each treatment. Be sure to read this information carefully each time. Talk to your pediatrician regarding the use of this medicine in children. While this drug may be prescribed for children as young as 12 years for selected conditions, precautions do apply. Overdosage: If you think you have taken too much of this medicine contact a poison control center or emergency room at once. NOTE: This medicine is only for you. Do not share this medicine with others. What if I miss a dose? It is important not to miss your dose. Call your doctor or health care professional if you are unable to keep an appointment. What may interact with this medication? Interactions have not been studied. This list may not describe all possible interactions. Give your health care provider a list of all the medicines, herbs, non-prescription drugs, or dietary supplements you use. Also tell them if you smoke, drink alcohol, or use illegal drugs. Some items may interact with your medicine. What should I watch for while using this medication? This drug may make you feel generally unwell. Continue your course of treatment even though you feel ill unless your doctor tells you to stop. You may need blood work done while you are taking this medicine. Do not become pregnant while taking this medicine or for 5 months after stopping it. Women should inform their doctor if they wish to become pregnant or think they might be pregnant. There is a  potential for serious side effects to an unborn child. Talk to your health care professional or pharmacist for more information. Do not breast-feed an infant while taking this medicine or for 5 months after stopping it. What side effects may I notice from receiving this medication? Side effects that you should report to your doctor or health care professional as soon as possible: allergic reactions like skin rash, itching or hives, swelling of the face, lips, or tongue breathing problems blood in the urine bloody or watery diarrhea or black, tarry stools changes in emotions or moods changes in vision chest pain cough dizziness feeling faint or lightheaded, falls fever, chills headache with fever, neck stiffness, confusion, loss of memory, sensitivity to light, hallucination, loss of contact with reality, or seizures joint pain mouth sores redness, blistering, peeling or loosening of the skin, including inside the mouth severe muscle pain or weakness signs and symptoms of high blood sugar such as dizziness; dry mouth; dry skin; fruity breath; nausea; stomach pain; increased hunger or thirst; increased urination signs and symptoms of kidney injury like trouble passing urine or change in the amount of urine signs and symptoms of liver injury like dark yellow or brown urine; general ill feeling or flu-like symptoms; light-colored stools; loss of appetite; nausea; right upper belly pain; unusually weak or  tired; yellowing of the eyes or skin swelling of the ankles, feet, hands trouble passing urine or change in the amount of urine unusually weak or tired weight gain or loss Side effects that usually do not require medical attention (report to your doctor or health care professional if they continue or are bothersome): bone pain constipation decreased appetite diarrhea muscle pain nausea, vomiting tiredness This list may not describe all possible side effects. Call your doctor for medical  advice about side effects. You may report side effects to FDA at 1-800-FDA-1088. Where should I keep my medication? This drug is given in a hospital or clinic and will not be stored at home. NOTE: This sheet is a summary. It may not cover all possible information. If you have questions about this medicine, talk to your doctor, pharmacist, or health care provider.  2022 Elsevier/Gold Standard (2019-09-12 10:08:25)   Oxaliplatin Injection What is this medication? OXALIPLATIN (ox AL i PLA tin) is a chemotherapy drug. It targets fast dividing cells, like cancer cells, and causes these cells to die. This medicine is used to treat cancers of the colon and rectum, and many other cancers. This medicine may be used for other purposes; ask your health care provider or pharmacist if you have questions. COMMON BRAND NAME(S): Eloxatin What should I tell my care team before I take this medication? They need to know if you have any of these conditions: heart disease history of irregular heartbeat liver disease low blood counts, like white cells, platelets, or red blood cells lung or breathing disease, like asthma take medicines that treat or prevent blood clots tingling of the fingers or toes, or other nerve disorder an unusual or allergic reaction to oxaliplatin, other chemotherapy, other medicines, foods, dyes, or preservatives pregnant or trying to get pregnant breast-feeding How should I use this medication? This drug is given as an infusion into a vein. It is administered in a hospital or clinic by a specially trained health care professional. Talk to your pediatrician regarding the use of this medicine in children. Special care may be needed. Overdosage: If you think you have taken too much of this medicine contact a poison control center or emergency room at once. NOTE: This medicine is only for you. Do not share this medicine with others. What if I miss a dose? It is important not to miss a  dose. Call your doctor or health care professional if you are unable to keep an appointment. What may interact with this medication? Do not take this medicine with any of the following medications: cisapride dronedarone pimozide thioridazine This medicine may also interact with the following medications: aspirin and aspirin-like medicines certain medicines that treat or prevent blood clots like warfarin, apixaban, dabigatran, and rivaroxaban cisplatin cyclosporine diuretics medicines for infection like acyclovir, adefovir, amphotericin B, bacitracin, cidofovir, foscarnet, ganciclovir, gentamicin, pentamidine, vancomycin NSAIDs, medicines for pain and inflammation, like ibuprofen or naproxen other medicines that prolong the QT interval (an abnormal heart rhythm) pamidronate zoledronic acid This list may not describe all possible interactions. Give your health care provider a list of all the medicines, herbs, non-prescription drugs, or dietary supplements you use. Also tell them if you smoke, drink alcohol, or use illegal drugs. Some items may interact with your medicine. What should I watch for while using this medication? Your condition will be monitored carefully while you are receiving this medicine. You may need blood work done while you are taking this medicine. This medicine may make you feel generally unwell.  This is not uncommon as chemotherapy can affect healthy cells as well as cancer cells. Report any side effects. Continue your course of treatment even though you feel ill unless your healthcare professional tells you to stop. This medicine can make you more sensitive to cold. Do not drink cold drinks or use ice. Cover exposed skin before coming in contact with cold temperatures or cold objects. When out in cold weather wear warm clothing and cover your mouth and nose to warm the air that goes into your lungs. Tell your doctor if you get sensitive to the cold. Do not become pregnant  while taking this medicine or for 9 months after stopping it. Women should inform their health care professional if they wish to become pregnant or think they might be pregnant. Men should not father a child while taking this medicine and for 6 months after stopping it. There is potential for serious side effects to an unborn child. Talk to your health care professional for more information. Do not breast-feed a child while taking this medicine or for 3 months after stopping it. This medicine has caused ovarian failure in some women. This medicine may make it more difficult to get pregnant. Talk to your health care professional if you are concerned about your fertility. This medicine has caused decreased sperm counts in some men. This may make it more difficult to father a child. Talk to your health care professional if you are concerned about your fertility. This medicine may increase your risk of getting an infection. Call your health care professional for advice if you get a fever, chills, or sore throat, or other symptoms of a cold or flu. Do not treat yourself. Try to avoid being around people who are sick. Avoid taking medicines that contain aspirin, acetaminophen, ibuprofen, naproxen, or ketoprofen unless instructed by your health care professional. These medicines may hide a fever. Be careful brushing or flossing your teeth or using a toothpick because you may get an infection or bleed more easily. If you have any dental work done, tell your dentist you are receiving this medicine. What side effects may I notice from receiving this medication? Side effects that you should report to your doctor or health care professional as soon as possible: allergic reactions like skin rash, itching or hives, swelling of the face, lips, or tongue breathing problems cough low blood counts - this medicine may decrease the number of white blood cells, red blood cells, and platelets. You may be at increased risk for  infections and bleeding nausea, vomiting pain, redness, or irritation at site where injected pain, tingling, numbness in the hands or feet signs and symptoms of bleeding such as bloody or black, tarry stools; red or dark brown urine; spitting up blood or brown material that looks like coffee grounds; red spots on the skin; unusual bruising or bleeding from the eyes, gums, or nose signs and symptoms of a dangerous change in heartbeat or heart rhythm like chest pain; dizziness; fast, irregular heartbeat; palpitations; feeling faint or lightheaded; falls signs and symptoms of infection like fever; chills; cough; sore throat; pain or trouble passing urine signs and symptoms of liver injury like dark yellow or brown urine; general ill feeling or flu-like symptoms; light-colored stools; loss of appetite; nausea; right upper belly pain; unusually weak or tired; yellowing of the eyes or skin signs and symptoms of low red blood cells or anemia such as unusually weak or tired; feeling faint or lightheaded; falls signs and symptoms of  muscle injury like dark urine; trouble passing urine or change in the amount of urine; unusually weak or tired; muscle pain; back pain Side effects that usually do not require medical attention (report to your doctor or health care professional if they continue or are bothersome): changes in taste diarrhea gas hair loss loss of appetite mouth sores This list may not describe all possible side effects. Call your doctor for medical advice about side effects. You may report side effects to FDA at 1-800-FDA-1088. Where should I keep my medication? This drug is given in a hospital or clinic and will not be stored at home. NOTE: This sheet is a summary. It may not cover all possible information. If you have questions about this medicine, talk to your doctor, pharmacist, or health care provider.  2022 Elsevier/Gold Standard (2018-09-27 12:20:35)  Leucovorin injection What is  this medication? LEUCOVORIN (loo koe VOR in) is used to prevent or treat the harmful effects of some medicines. This medicine is used to treat anemia caused by a low amount of folic acid in the body. It is also used with 5-fluorouracil (5-FU) to treat colon cancer. This medicine may be used for other purposes; ask your health care provider or pharmacist if you have questions. What should I tell my care team before I take this medication? They need to know if you have any of these conditions: anemia from low levels of vitamin B-12 in the blood an unusual or allergic reaction to leucovorin, folic acid, other medicines, foods, dyes, or preservatives pregnant or trying to get pregnant breast-feeding How should I use this medication? This medicine is for injection into a muscle or into a vein. It is given by a health care professional in a hospital or clinic setting. Talk to your pediatrician regarding the use of this medicine in children. Special care may be needed. Overdosage: If you think you have taken too much of this medicine contact a poison control center or emergency room at once. NOTE: This medicine is only for you. Do not share this medicine with others. What if I miss a dose? This does not apply. What may interact with this medication? capecitabine fluorouracil phenobarbital phenytoin primidone trimethoprim-sulfamethoxazole This list may not describe all possible interactions. Give your health care provider a list of all the medicines, herbs, non-prescription drugs, or dietary supplements you use. Also tell them if you smoke, drink alcohol, or use illegal drugs. Some items may interact with your medicine. What should I watch for while using this medication? Your condition will be monitored carefully while you are receiving this medicine. This medicine may increase the side effects of 5-fluorouracil, 5-FU. Tell your doctor or health care professional if you have diarrhea or mouth  sores that do not get better or that get worse. What side effects may I notice from receiving this medication? Side effects that you should report to your doctor or health care professional as soon as possible: allergic reactions like skin rash, itching or hives, swelling of the face, lips, or tongue breathing problems fever, infection mouth sores unusual bleeding or bruising unusually weak or tired Side effects that usually do not require medical attention (report to your doctor or health care professional if they continue or are bothersome): constipation or diarrhea loss of appetite nausea, vomiting This list may not describe all possible side effects. Call your doctor for medical advice about side effects. You may report side effects to FDA at 1-800-FDA-1088. Where should I keep my medication? This drug  is given in a hospital or clinic and will not be stored at home. NOTE: This sheet is a summary. It may not cover all possible information. If you have questions about this medicine, talk to your doctor, pharmacist, or health care provider.  2022 Elsevier/Gold Standard (2007-11-14 16:50:29)  Fluorouracil, 5-FU injection What is this medication? FLUOROURACIL, 5-FU (flure oh YOOR a sil) is a chemotherapy drug. It slows the growth of cancer cells. This medicine is used to treat many types of cancer like breast cancer, colon or rectal cancer, pancreatic cancer, and stomach cancer. This medicine may be used for other purposes; ask your health care provider or pharmacist if you have questions. COMMON BRAND NAME(S): Adrucil What should I tell my care team before I take this medication? They need to know if you have any of these conditions: blood disorders dihydropyrimidine dehydrogenase (DPD) deficiency infection (especially a virus infection such as chickenpox, cold sores, or herpes) kidney disease liver disease malnourished, poor nutrition recent or ongoing radiation therapy an unusual  or allergic reaction to fluorouracil, other chemotherapy, other medicines, foods, dyes, or preservatives pregnant or trying to get pregnant breast-feeding How should I use this medication? This drug is given as an infusion or injection into a vein. It is administered in a hospital or clinic by a specially trained health care professional. Talk to your pediatrician regarding the use of this medicine in children. Special care may be needed. Overdosage: If you think you have taken too much of this medicine contact a poison control center or emergency room at once. NOTE: This medicine is only for you. Do not share this medicine with others. What if I miss a dose? It is important not to miss your dose. Call your doctor or health care professional if you are unable to keep an appointment. What may interact with this medication? Do not take this medicine with any of the following medications: live virus vaccines This medicine may also interact with the following medications: medicines that treat or prevent blood clots like warfarin, enoxaparin, and dalteparin This list may not describe all possible interactions. Give your health care provider a list of all the medicines, herbs, non-prescription drugs, or dietary supplements you use. Also tell them if you smoke, drink alcohol, or use illegal drugs. Some items may interact with your medicine. What should I watch for while using this medication? Visit your doctor for checks on your progress. This drug may make you feel generally unwell. This is not uncommon, as chemotherapy can affect healthy cells as well as cancer cells. Report any side effects. Continue your course of treatment even though you feel ill unless your doctor tells you to stop. In some cases, you may be given additional medicines to help with side effects. Follow all directions for their use. Call your doctor or health care professional for advice if you get a fever, chills or sore throat, or  other symptoms of a cold or flu. Do not treat yourself. This drug decreases your body's ability to fight infections. Try to avoid being around people who are sick. This medicine may increase your risk to bruise or bleed. Call your doctor or health care professional if you notice any unusual bleeding. Be careful brushing and flossing your teeth or using a toothpick because you may get an infection or bleed more easily. If you have any dental work done, tell your dentist you are receiving this medicine. Avoid taking products that contain aspirin, acetaminophen, ibuprofen, naproxen, or ketoprofen unless instructed  by your doctor. These medicines may hide a fever. Do not become pregnant while taking this medicine. Women should inform their doctor if they wish to become pregnant or think they might be pregnant. There is a potential for serious side effects to an unborn child. Talk to your health care professional or pharmacist for more information. Do not breast-feed an infant while taking this medicine. Men should inform their doctor if they wish to father a child. This medicine may lower sperm counts. Do not treat diarrhea with over the counter products. Contact your doctor if you have diarrhea that lasts more than 2 days or if it is severe and watery. This medicine can make you more sensitive to the sun. Keep out of the sun. If you cannot avoid being in the sun, wear protective clothing and use sunscreen. Do not use sun lamps or tanning beds/booths. What side effects may I notice from receiving this medication? Side effects that you should report to your doctor or health care professional as soon as possible: allergic reactions like skin rash, itching or hives, swelling of the face, lips, or tongue low blood counts - this medicine may decrease the number of white blood cells, red blood cells and platelets. You may be at increased risk for infections and bleeding. signs of infection - fever or chills,  cough, sore throat, pain or difficulty passing urine signs of decreased platelets or bleeding - bruising, pinpoint red spots on the skin, black, tarry stools, blood in the urine signs of decreased red blood cells - unusually weak or tired, fainting spells, lightheadedness breathing problems changes in vision chest pain mouth sores nausea and vomiting pain, swelling, redness at site where injected pain, tingling, numbness in the hands or feet redness, swelling, or sores on hands or feet stomach pain unusual bleeding Side effects that usually do not require medical attention (report to your doctor or health care professional if they continue or are bothersome): changes in finger or toe nails diarrhea dry or itchy skin hair loss headache loss of appetite sensitivity of eyes to the light stomach upset unusually teary eyes This list may not describe all possible side effects. Call your doctor for medical advice about side effects. You may report side effects to FDA at 1-800-FDA-1088. Where should I keep my medication? This drug is given in a hospital or clinic and will not be stored at home. NOTE: This sheet is a summary. It may not cover all possible information. If you have questions about this medicine, talk to your doctor, pharmacist, or health care provider.  2022 Elsevier/Gold Standard (2019-04-10 15:00:03)

## 2021-03-24 LAB — CEA: CEA: 288 ng/mL — ABNORMAL HIGH (ref 0.0–4.7)

## 2021-03-25 ENCOUNTER — Inpatient Hospital Stay (HOSPITAL_COMMUNITY): Payer: Medicaid Other | Attending: Hematology

## 2021-03-25 ENCOUNTER — Other Ambulatory Visit: Payer: Self-pay

## 2021-03-25 VITALS — BP 111/58 | HR 76 | Temp 97.3°F | Resp 18

## 2021-03-25 DIAGNOSIS — C7951 Secondary malignant neoplasm of bone: Secondary | ICD-10-CM | POA: Insufficient documentation

## 2021-03-25 DIAGNOSIS — C772 Secondary and unspecified malignant neoplasm of intra-abdominal lymph nodes: Secondary | ICD-10-CM | POA: Diagnosis not present

## 2021-03-25 DIAGNOSIS — Z5112 Encounter for antineoplastic immunotherapy: Secondary | ICD-10-CM | POA: Diagnosis present

## 2021-03-25 DIAGNOSIS — Z5111 Encounter for antineoplastic chemotherapy: Secondary | ICD-10-CM | POA: Diagnosis present

## 2021-03-25 DIAGNOSIS — C787 Secondary malignant neoplasm of liver and intrahepatic bile duct: Secondary | ICD-10-CM | POA: Diagnosis not present

## 2021-03-25 DIAGNOSIS — C155 Malignant neoplasm of lower third of esophagus: Secondary | ICD-10-CM | POA: Insufficient documentation

## 2021-03-25 DIAGNOSIS — Z79899 Other long term (current) drug therapy: Secondary | ICD-10-CM | POA: Diagnosis not present

## 2021-03-25 DIAGNOSIS — Z95828 Presence of other vascular implants and grafts: Secondary | ICD-10-CM

## 2021-03-25 MED ORDER — SODIUM CHLORIDE 0.9% FLUSH
10.0000 mL | INTRAVENOUS | Status: DC | PRN
Start: 2021-03-25 — End: 2021-03-25
  Administered 2021-03-25: 10 mL

## 2021-03-25 MED ORDER — HEPARIN SOD (PORK) LOCK FLUSH 100 UNIT/ML IV SOLN
500.0000 [IU] | Freq: Once | INTRAVENOUS | Status: AC | PRN
  Administered 2021-03-25: 500 [IU]

## 2021-03-25 NOTE — Progress Notes (Signed)
Stanley Jimenez presents to have home infusion pump d/c'd and for port-a-cath deaccess with flush.  Portacath located R chest wall accessed with  H 20 needle.  Good blood return present. Portacath flushed with NS and 500U/55ml Heparin, and needle removed intact.  Procedure tolerated well and without incident.  Patient left ambulatory in stable condition.

## 2021-03-25 NOTE — Patient Instructions (Signed)
[facility]  Discharge Instructions: Thank you for choosing [facility] to provide your oncology and hematology care.  If you have a lab appointment with the Cancer Center, please come in thru the Main Entrance and check in at the main information desk.  Wear comfortable clothing and clothing appropriate for easy access to any Portacath or PICC line.   We strive to give you quality time with your provider. You may need to reschedule your appointment if you arrive late (15 or more minutes).  Arriving late affects you and other patients whose appointments are after yours.  Also, if you miss three or more appointments without notifying the office, you may be dismissed from the clinic at the provider's discretion.      For prescription refill requests, have your pharmacy contact our office and allow 72 hours for refills to be completed.        To help prevent nausea and vomiting after your treatment, we encourage you to take your nausea medication as directed.  BELOW ARE SYMPTOMS THAT SHOULD BE REPORTED IMMEDIATELY: *FEVER GREATER THAN 100.4 F (38 C) OR HIGHER *CHILLS OR SWEATING *NAUSEA AND VOMITING THAT IS NOT CONTROLLED WITH YOUR NAUSEA MEDICATION *UNUSUAL SHORTNESS OF BREATH *UNUSUAL BRUISING OR BLEEDING *URINARY PROBLEMS (pain or burning when urinating, or frequent urination) *BOWEL PROBLEMS (unusual diarrhea, constipation, pain near the anus) TENDERNESS IN MOUTH AND THROAT WITH OR WITHOUT PRESENCE OF ULCERS (sore throat, sores in mouth, or a toothache) UNUSUAL RASH, SWELLING OR PAIN  UNUSUAL VAGINAL DISCHARGE OR ITCHING   Items with * indicate a potential emergency and should be followed up as soon as possible or go to the Emergency Department if any problems should occur.  Please show the CHEMOTHERAPY ALERT CARD or IMMUNOTHERAPY ALERT CARD at check-in to the Emergency Department and triage nurse.  Should you have questions after your visit or need to cancel  or reschedule your appointment, please contact [facility] [phone]  and follow the prompts.  Office hours are 8:00 a.m. to 4:30 p.m. Monday - Friday. Please note that voicemails left after 4:00 p.m. may not be returned until the following business day.  We are closed weekends and major holidays. You have access to a nurse at all times for urgent questions. Please call the main number to the clinic [phone] and follow the prompts.  For any non-urgent questions, you may also contact your provider using MyChart. We now offer e-Visits for anyone 18 and older to request care online for non-urgent symptoms. For details visit [email or web address].   Also download the MyChart app! Go to the app store, search "MyChart", open the app, select [facility], and log in with your MyChart username and password.  Due to Covid, a mask is required upon entering the hospital/clinic. If you do not have a mask, one will be given to you upon arrival. For doctor visits, patients may have 1 support person aged 18 or older with them. For treatment visits, patients cannot have anyone with them due to current Covid guidelines and our immunocompromised population.  

## 2021-03-26 DIAGNOSIS — C159 Malignant neoplasm of esophagus, unspecified: Secondary | ICD-10-CM | POA: Diagnosis not present

## 2021-04-05 NOTE — Progress Notes (Signed)
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  Gleason 3+3 = 6 pattern.   08/14/2014 Initial Diagnosis   Malignant neoplasm of prostate [facility])    - 09/20/2014 Radiation Therapy   Brachytherapy with I-125   Malignant tumor of lower third of esophagus ([place])  04/14/2016 Procedure   EGD by Dr. Laural Golden.   04/16/2016 Pathology Results   Esophagus, biopsy - INVASIVE POORLY DIFFERENTIATED SQUAMOUS CELL CARCINOMA.   04/26/2016 Imaging   CT CAP- 1. Prominent gastroesophageal junction without discrete esophageal mass. Notably, there is also a borderline enlarged enhancing lymph node in the gastrohepatic ligament immediately adjacent to the gastroesophageal junction, which is nonspecific, but could indicate early nodal disease. 2. The small cluster of low-attenuation lesions in segment 7 of the liver are too small to characterize. Statistically, these are likely to represent cysts. If there is clinical concern for metastatic disease to the liver, these could be further characterized with MRI of the abdomen with and without IV gadolinium at this time. 3. No other potential sites of metastatic disease noted elsewhere in the chest, abdomen or pelvis.   05/10/2016 PET scan   Hypermetabolic bilateral lower thoracic esophageal mass, consistent with known primary esophageal carcinoma.  Small  hypermetabolic lymph node in the upper gastrohepatic ligament adjacent to GE junction, consistent with lymph node metastasis.  No other sites of metastatic disease identified.   05/31/2016 Procedure   Port placed by Dr. Rosana Hoes   06/02/2016 - 07/14/2016 Chemotherapy   Weekly Carboplatin/Paclitaxel with XRT.  Tolerated extremely well.    06/02/2016 - 07/14/2016 Radiation Therapy   [place], [place].  Tolerated well.   08/23/2016 PET scan   1. Complete metabolic response. No residual hypermetabolism in the thoracic esophagus or in the upper gastrohepatic ligament lymph node, which is decreased in size. 2. Aortic atherosclerosis.  Three-vessel coronary atherosclerosis.   08/24/2016 Miscellaneous   CTS consult with Dr. Servando Snare- "I've recommended to him that surgical resection offers the best chance of cure, if he waits and there is recurrent disease remaining no longer be a surgical candidate. From his discussion today I think he is not inclined to agree but it has been offered."   03/09/2017 Imaging   CT CAP: 1. Mild distal esophageal and GE junction thickening, which appears slightly more prominent compared with PET-CT 08/20/2016. Correlation with PET-CT suggested. 2. Negative for pulmonary nodules or metastatic disease to the chest. 3. Stable subcentimeter posterior right hepatic lobe hypodensities. No definite evidence for metastatic disease to the abdomen or pelvis   04/28/2017 PET scan   No evidence of recurrent or metastatic carcinoma.   02/23/2021 -  Chemotherapy   Patient is on Treatment Plan : GASTROESOPHAGEAL FOLFOX + Opdivo q14d x 6 cycles       CANCER STAGING: Cancer Staging Malignant tumor of lower third of esophagus ([employer]) Staging form: Esophagus - Squamous Cell Carcinoma, AJCC 8th Edition - Clinical stage from 06/15/2016: Stage Unknown (cTX,  cN1, cM0, L: Lower) - Signed by Baird Cancer, PA-C on 06/15/2016 - Pathologic stage from 02/17/2021: Stage IVB (rpTX, pN3, pM1, G3) -  Unsigned   INTERVAL HISTORY:  Mr. JAMAHL Jimenez, a 65 y.o. male, returns for routine follow-up and consideration for next cycle of chemotherapy. Kayler was last seen on 03/23/2021.  Due for cycle #4 of FOLFOX today.   Overall, he tells me he has been feeling pretty well. He denies cold sensitivity. He denies diarrhea but reports constipation following treatment which has since resolved. His appetite is poor; he is drinking 3 Ensure daily after he eats. He reports one episode of vomiting. He denies tingling/numbness, cough, and SOB. He reports rhinorrhea. His CP and back pain have improved and now only occasional; he reports taking Vicodin TID. He denies fatigue.   Overall, he feels ready for next cycle of chemo today.   REVIEW OF SYSTEMS:  Review of Systems  Constitutional:  Positive for appetite change (40%) and unexpected weight change (-8 lbs). Negative for fatigue (50%).  HENT:          Rhinorrhea  Respiratory:  Negative for cough and shortness of breath.   Cardiovascular:  Negative for chest pain (improved).  Gastrointestinal:  Positive for constipation and vomiting (x1). Negative for diarrhea.  Musculoskeletal:  Negative for back pain (improved).  Neurological:  Positive for dizziness.  All other systems reviewed and are negative.  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HCC)    Clubbing of fingers    congenital   Depression    Full dentures    History of PSVT (paroxysmal supraventricular tachycardia)    run of non-sustatined VT 03-10-2015 in setting of alcohol withdrawal in hospital   History of seizure    03-08-2012  alcohol withdrawal   History of thrombocytopenia    10/ 2013  in setting of alcohol withdrawal   Hyperlipidemia    Hypertension    Port-A-Cath in place 02/20/2021   Prostate cancer [facility]) urologist-  dr dalhstedt/   oncologist- dr Tammi Klippel   T1c, Gleason 3+3,  PSA 8.89,  vol 27cc   Psoriasis, 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   Seizures ([place])    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   Squamous cell carcinoma of esophagus ([place]) 04/21/2016   Past Surgical History:  Procedure Laterality Date   BIOPSY  04/14/2016   Procedure: BIOPSY;  Surgeon: Rogene Houston, MD;  [facility];  Service: Endoscopy;;  esophagus   COLONOSCOPY N/A 04/14/2016   Procedure: COLONOSCOPY;  Surgeon: Rogene Houston, MD;  Location: [facility];  Service: Endoscopy;  Laterality: N/A;   ESOPHAGOGASTRODUODENOSCOPY N/A 04/14/2016   Procedure: ESOPHAGOGASTRODUODENOSCOPY (EGD);  Surgeon: Rogene Houston, MD;  Location: [facility];  Service: Endoscopy;  Laterality: N/A;  1:00   NO PAST SURGERIES     PORTACATH PLACEMENT Right 05/31/2016   Procedure: INSERTION OF TUNNELED RIGHT INTERNAL JUGULAR BARD POWERPORT CENTRAL VENOUS CATHETER WITH SUBCUTANEOUS PORT;  Surgeon: Vickie Epley, MD;  Location: [facility] OR[facility];  Service: Vascular;  Laterality: Right;   RADIOACTIVE SEED IMPLANT N/A 09/20/2014   Procedure: RADIOACTIVE SEED IMPLANT;  Surgeon: Franchot Gallo, MD;  Location[facility];  Service: Urology;  Laterality: N/A;    77   seeds implanted no seeds found in bladder  TRANSURETHRAL RESECTION OF PROSTATE      SOCIAL HISTORY:  Social History   Socioeconomic History   Marital status: Single    Spouse name: Not on file   Number of children: Not on file   Years of education: Not on file   Highest education level: Not on file  Occupational History   Not on file  Tobacco Use   Smoking status: Former    Packs/day: 0.50    Years: 20.00    Pack years: 10.00    Types: Cigarettes    Quit date: 11/12/2020    Years since quitting: 0.3   Smokeless tobacco: Former    Types: Chew    Quit date: 05/27/1984   Tobacco comments:    only smoking a few  Vaping Use   Vaping Use: Never used  Substance and  Sexual Activity   Alcohol use: Not on file    Comment: quit drinking in October 2015. He says before this he says he stayed drunk every day.    Drug use: No    Comment: last use october 2015   Sexual activity: Never    Birth control/protection: None  Other Topics Concern   Not on file  Social History Narrative   Not on file   Social Determinants of Health   Financial Resource Strain: Not on file  Food Insecurity: Not on file  Transportation Needs: Not on file  Physical Activity: Not on file  Stress: Not on file  Social Connections: Not on file  Intimate Partner Violence: Not on file    FAMILY HISTORY:  Family History  Problem Relation Age of Onset   Alcohol abuse Mother    Diabetes Sister     CURRENT MEDICATIONS:  Current Outpatient Medications  Medication Sig Dispense Refill   amLODipine (NORVASC) 10 MG tablet TAKE 1 TABLET BY MOUTH DAILY 90 tablet 1   fluorouracil CALGB 62703 2,400 mg/m2 in sodium chloride 0.9 % 150 mL Inject 2,400 mg/m2 into the vein over 48 hr.     FLUOROURACIL IV Inject into the vein every 14 (fourteen) days.     LEUCOVORIN CALCIUM IV Inject into the vein every 14 (fourteen) days.     lidocaine-prilocaine (EMLA) cream Apply a small amount to port a cath site and cover with plastic wrap 1 hour prior to chemotherapy appointments 30 g 3   OXALIPLATIN IV Inject into the vein every 14 (fourteen) days.     oxyCODONE-acetaminophen (PERCOCET) 10-325 MG tablet Take 1 tablet by mouth every 6 (six) hours as needed for pain. 60 tablet 0   prochlorperazine (COMPAZINE) 10 MG tablet Take 1 tablet (10 mg total) by mouth every 6 (six) hours as needed (Nausea or vomiting). 30 tablet 1   tamsulosin (FLOMAX) 0.4 MG CAPS capsule TAKE 1 CAPSULE(0.4 MG) BY MOUTH DAILY 90 capsule 1   triamcinolone cream (KENALOG) 0.1 % APPLY TO THE AFFECTED AREA TWICE DAILY 454 g 0   No current facility-administered medications for this visit.    ALLERGIES:  No Known  Allergies  PHYSICAL EXAM:  Performance status (ECOG): 1 - Symptomatic but completely ambulatory  There were no vitals filed for this visit. Wt Readings from Last 3 Encounters:  03/23/21 146 lb 3.2 oz (66.3 kg)  03/17/21 147 lb 1.3 oz (66.7 kg)  03/09/21 151 lb 6.4 oz (68.7 kg)   Physical Exam Vitals reviewed.  Constitutional:      Appearance: Normal appearance.  Cardiovascular:     Rate and Rhythm: Normal rate  and regular rhythm.     Pulses: Normal pulses.     Heart sounds: Normal heart sounds.  Pulmonary:     Effort: Pulmonary effort is normal.     Breath sounds: Normal breath sounds.  Neurological:     General: No focal deficit present.     Mental Status: He is alert and oriented to person, place, and time.  Psychiatric:        Mood and Affect: Mood normal.        Behavior: Behavior normal.    LABORATORY DATA:  I have reviewed the labs as listed.  CBC Latest Ref Rng & Units 03/23/2021 03/09/2021 02/23/2021  WBC 4.0 - 10.5 K/uL 5.3 5.6 10.6(H)  Hemoglobin 13.0 - 17.0 g/dL 8.8(L) 8.9(L) 9.7(L)  Hematocrit 39.0 - 52.0 % 27.9(L) 27.4(L) 30.0(L)  Platelets 150 - 400 K/uL 227 268 370   CMP Latest Ref Rng & Units 03/23/2021 03/09/2021 02/23/2021  Glucose 70 - 99 mg/dL 126(H) 95 119(H)  BUN 8 - 23 mg/dL _0 Creatinine 0.61 - 1.24 mg/dL 0.83 0.76 0.96  Sodium 135 - 145 mmol/L 132(L) 132(L) 132(L)  Potassium 3.5 - 5.1 mmol/L 3.7 4.0 3.6  Chloride 98 - 111 mmol/L 99 101 99  CO2 22 - 32 mmol/L _1 Calcium 8.9 - 10.3 mg/dL 8.7(L) 8.5(L) 9.3  Total Protein 6.5 - 8.1 g/dL 7.2 7.3 7.7  Total Bilirubin 0.3 - 1.2 mg/dL 0.6 0.3 0.6  Alkaline Phos 38 - 126 U/L 159(H) 172(H) 181(H)  AST 15 - 41 U/L 34 38 32  ALT 0 - 44 U/L 31 48(H) 43    DIAGNOSTIC IMAGING:  I have independently reviewed the scans and discussed with the patient. No results found.   ASSESSMENT:  1.  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 - I have reviewed images of the CT AP with contrast from 01/16/2021 which showed mild subjective thickening at the GE junction without defined mass.  There is extensive hepatic metastasis, upper abdominal and retroperitoneal adenopathy and widespread metastatic disease to the bones. - Liver lesion biopsy on 02/06/2021 consistent with high-grade poorly differentiated carcinoma (adeno squamous carcinoma). - Caris testing PD-L1 CPS 40%.  HER2 was negative.  MSI is stable.  TMB was low.  Other targetable mutations were negative.   2.  Social/family history: - He lives at home by himself and is independent of ADLs and IADLs.  He worked in [employer] in the past.  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  His sister lives close by. - He smoked half pack per day for 48 years and quit around November 15, 2020. - 2 of his sisters died of cancer.  Patient does not know the type.   PLAN:  1.  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 - He does not report any immunotherapy related side effects. - He had nausea and vomited once.  He had constipation but denied any diarrhea.  No tingling or numbness in extremities.  He had slight runny nose. - Reviewed his labs today which showed normal white count and platelet count.  LFTs show improvement of alkaline phosphatase. - Proceed with next cycle today.  CEA has slightly improved to 288 from 296. - RTC 2 weeks for follow-up with repeat labs and treatment.   2.  Chest wall and back pain: - He reports right lateral rib pain is better. - Continue Percocet 10/325 every 8 hours as needed.  3.  Weight loss: - He lost about 8 pounds. - He is  drinking 3 cans of Ensure per day. - We will start him on Megace 400 mg twice daily.  4.  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 - D63 and folic acid were normal.  Ferritin was 969 with percent saturation 6. - Hemoglobin today improved to 9.2.   Orders placed this encounter:  No orders of the defined types were placed in this encounter.    Derek Jack, MD [place] [phone]   I, Thana Ates, am acting as a scribe for Dr. Derek Jack.  I, Derek Jack MD, have reviewed the above documentation for accuracy and completeness, and I agree with the above.

## 2021-04-06 ENCOUNTER — Inpatient Hospital Stay (HOSPITAL_COMMUNITY): Payer: Medicaid Other | Admitting: Dietician

## 2021-04-06 ENCOUNTER — Other Ambulatory Visit: Payer: Self-pay

## 2021-04-06 ENCOUNTER — Inpatient Hospital Stay (HOSPITAL_COMMUNITY): Payer: Medicaid Other

## 2021-04-06 ENCOUNTER — Inpatient Hospital Stay (HOSPITAL_BASED_OUTPATIENT_CLINIC_OR_DEPARTMENT_OTHER): Payer: Medicaid Other | Admitting: Hematology

## 2021-04-06 VITALS — BP 110/60 | HR 96 | Temp 96.2°F | Resp 18 | Wt 139.6 lb

## 2021-04-06 VITALS — BP 124/69 | HR 72 | Temp 97.7°F | Resp 18

## 2021-04-06 DIAGNOSIS — C159 Malignant neoplasm of esophagus, unspecified: Secondary | ICD-10-CM | POA: Diagnosis not present

## 2021-04-06 DIAGNOSIS — Z95828 Presence of other vascular implants and grafts: Secondary | ICD-10-CM

## 2021-04-06 DIAGNOSIS — C155 Malignant neoplasm of lower third of esophagus: Secondary | ICD-10-CM

## 2021-04-06 DIAGNOSIS — K769 Liver disease, unspecified: Secondary | ICD-10-CM | POA: Diagnosis not present

## 2021-04-06 DIAGNOSIS — Z5112 Encounter for antineoplastic immunotherapy: Secondary | ICD-10-CM | POA: Diagnosis not present

## 2021-04-06 LAB — CBC WITH DIFFERENTIAL/PLATELET
Abs Immature Granulocytes: 0.02 10*3/uL (ref 0.00–0.07)
Basophils Absolute: 0 10*3/uL (ref 0.0–0.1)
Basophils Relative: 1 %
Eosinophils Absolute: 0 10*3/uL (ref 0.0–0.5)
Eosinophils Relative: 1 %
HCT: 29.8 % — ABNORMAL LOW (ref 39.0–52.0)
Hemoglobin: 9.2 g/dL — ABNORMAL LOW (ref 13.0–17.0)
Immature Granulocytes: 0 %
Lymphocytes Relative: 24 %
Lymphs Abs: 1.3 10*3/uL (ref 0.7–4.0)
MCH: 25.7 pg — ABNORMAL LOW (ref 26.0–34.0)
MCHC: 30.9 g/dL (ref 30.0–36.0)
MCV: 83.2 fL (ref 80.0–100.0)
Monocytes Absolute: 1.1 10*3/uL — ABNORMAL HIGH (ref 0.1–1.0)
Monocytes Relative: 21 %
Neutro Abs: 2.8 10*3/uL (ref 1.7–7.7)
Neutrophils Relative %: 53 %
Platelets: 172 10*3/uL (ref 150–400)
RBC: 3.58 MIL/uL — ABNORMAL LOW (ref 4.22–5.81)
RDW: 20.7 % — ABNORMAL HIGH (ref 11.5–15.5)
WBC: 5.3 10*3/uL (ref 4.0–10.5)
nRBC: 0 % (ref 0.0–0.2)

## 2021-04-06 LAB — COMPREHENSIVE METABOLIC PANEL
ALT: 27 U/L (ref 0–44)
AST: 26 U/L (ref 15–41)
Albumin: 3.1 g/dL — ABNORMAL LOW (ref 3.5–5.0)
Alkaline Phosphatase: 149 U/L — ABNORMAL HIGH (ref 38–126)
Anion gap: 6 (ref 5–15)
BUN: 24 mg/dL — ABNORMAL HIGH (ref 8–23)
CO2: 26 mmol/L (ref 22–32)
Calcium: 8.8 mg/dL — ABNORMAL LOW (ref 8.9–10.3)
Chloride: 105 mmol/L (ref 98–111)
Creatinine, Ser: 0.89 mg/dL (ref 0.61–1.24)
GFR, Estimated: >60 mL/min (ref >60)
Glucose, Bld: 131 mg/dL — ABNORMAL HIGH (ref 70–99)
Potassium: 3.7 mmol/L (ref 3.5–5.1)
Sodium: 137 mmol/L (ref 135–145)
Total Bilirubin: 0.7 mg/dL (ref 0.3–1.2)
Total Protein: 6.8 g/dL (ref 6.5–8.1)

## 2021-04-06 LAB — MAGNESIUM: Magnesium: 2.4 mg/dL (ref 1.7–2.4)

## 2021-04-06 LAB — TSH: TSH: 1.043 u[IU]/mL (ref 0.350–4.500)

## 2021-04-06 MED ORDER — MEGESTROL ACETATE 400 MG/10ML PO SUSP: 400.0000 mg | Freq: Two times a day (BID) | ORAL | 2 refills | Status: DC

## 2021-04-06 MED ORDER — PALONOSETRON HCL INJECTION 0.25 MG/5ML
0.2500 mg | Freq: Once | INTRAVENOUS | Status: AC
  Administered 2021-04-06: 0.25 mg via INTRAVENOUS
  Filled 2021-04-06: qty 5

## 2021-04-06 MED ORDER — OXALIPLATIN CHEMO INJECTION 100 MG/20ML
82.0000 mg/m2 | Freq: Once | INTRAVENOUS | Status: AC
  Administered 2021-04-06: 150 mg via INTRAVENOUS
  Filled 2021-04-06: qty 10

## 2021-04-06 MED ORDER — SODIUM CHLORIDE 0.9 % IV SOLN
2400.0000 mg/m2 | INTRAVENOUS | Status: DC
  Administered 2021-04-06: 4400 mg via INTRAVENOUS
  Filled 2021-04-06: qty 88

## 2021-04-06 MED ORDER — FLUOROURACIL CHEMO INJECTION 2.5 GM/50ML
400.0000 mg/m2 | Freq: Once | INTRAVENOUS | Status: AC
  Administered 2021-04-06: 750 mg via INTRAVENOUS
  Filled 2021-04-06: qty 15

## 2021-04-06 MED ORDER — SODIUM CHLORIDE 0.9 % IV SOLN
10.0000 mg | Freq: Once | INTRAVENOUS | Status: AC
  Administered 2021-04-06: 10 mg via INTRAVENOUS
  Filled 2021-04-06: qty 10

## 2021-04-06 MED ORDER — SODIUM CHLORIDE 0.9 % IV SOLN
240.0000 mg | Freq: Once | INTRAVENOUS | Status: AC
  Administered 2021-04-06: 240 mg via INTRAVENOUS
  Filled 2021-04-06: qty 24

## 2021-04-06 MED ORDER — OXYCODONE-ACETAMINOPHEN 10-325 MG PO TABS
1.0000 | ORAL_TABLET | Freq: Three times a day (TID) | ORAL | 0 refills | Status: DC | PRN
Start: 2021-04-06 — End: 2021-05-26

## 2021-04-06 MED ORDER — LEUCOVORIN CALCIUM INJECTION 350 MG
400.0000 mg/m2 | Freq: Once | INTRAVENOUS | Status: AC
  Administered 2021-04-06: 736 mg via INTRAVENOUS
  Filled 2021-04-06: qty 36.8

## 2021-04-06 MED ORDER — SODIUM CHLORIDE 0.9 % IV SOLN: INTRAVENOUS | Status: DC

## 2021-04-06 MED ORDER — DEXTROSE 5 % IV SOLN: Freq: Once | INTRAVENOUS | Status: AC

## 2021-04-06 NOTE — Progress Notes (Signed)
Pt presents today for Opdivo/FOLFOX per provider's order. Vital signs and labs WNL for treatment.Okay to proceed with treatment per Dr.K.  Opdivo/FOLFOX given today per MD orders. Tolerated infusion without adverse affects. Vital signs stable. No complaints at this time. Discharged from clinic ambulatory in stable condition. Alert and oriented x 3. F/U with [facility] as scheduled. 5FU ambulatory pump infusing.

## 2021-04-06 NOTE — Progress Notes (Signed)
Patient seen and assessed, labs reviewed by Dr. Delton Coombes. Okay to proceed with treatment per Dr. Delton Coombes. Primary RN and Pharmacy made aware.

## 2021-04-06 NOTE — Patient Instructions (Signed)
[facility]  Discharge Instructions: Thank you for choosing [facility] to provide your oncology and hematology care.  If you have a lab appointment with the [facility], please come in thru the Main Entrance and check in at the main information desk.  Wear comfortable clothing and clothing appropriate for easy access to any Portacath or PICC line.   We strive to give you quality time with your provider. You may need to reschedule your appointment if you arrive late (15 or more minutes).  Arriving late affects you and other patients whose appointments are after yours.  Also, if you miss three or more appointments without notifying the office, you may be dismissed from the clinic at the provider's discretion.      For prescription refill requests, have your pharmacy contact our office and allow 72 hours for refills to be completed.    Today you received the following chemotherapy and/or immunotherapy agents Opdivo and FOLFOX.   To help prevent nausea and vomiting after your treatment, we encourage you to take your nausea medication as directed.  BELOW ARE SYMPTOMS THAT SHOULD BE REPORTED IMMEDIATELY: *FEVER GREATER THAN 100.4 F (38 C) OR HIGHER *CHILLS OR SWEATING *NAUSEA AND VOMITING THAT IS NOT CONTROLLED WITH YOUR NAUSEA MEDICATION *UNUSUAL SHORTNESS OF BREATH *UNUSUAL BRUISING OR BLEEDING *URINARY PROBLEMS (pain or burning when urinating, or frequent urination) *BOWEL PROBLEMS (unusual diarrhea, constipation, pain near the anus) TENDERNESS IN MOUTH AND THROAT WITH OR WITHOUT PRESENCE OF ULCERS (sore throat, sores in mouth, or a toothache) UNUSUAL RASH, SWELLING OR PAIN  UNUSUAL VAGINAL DISCHARGE OR ITCHING   Items with * indicate a potential emergency and should be followed up as soon as possible or go to the Emergency Department if any problems should occur.  Please show the CHEMOTHERAPY ALERT CARD or IMMUNOTHERAPY ALERT CARD at check-in to the Emergency  Department and triage nurse.  Should you have questions after your visit or need to cancel or reschedule your appointment, please contact [facility] [phone]  and follow the prompts.  Office hours are 8:00 a.m. to 4:30 p.m. Monday - Friday. Please note that voicemails left after 4:00 p.m. may not be returned until the following business day.  We are closed weekends and major holidays. You have access to a nurse at all times for urgent questions. Please call the main number to the clinic [phone] and follow the prompts.  For any non-urgent questions, you may also contact your provider using MyChart. We now offer e-Visits for anyone 21 and older to request care online for non-urgent symptoms. For details visit [email or web address].   Also download the MyChart app! Go to the app store, search "MyChart", open the app, select [facility], and log in with your MyChart username and password.  Due to Covid, a mask is required upon entering the hospital/clinic. If you do not have a mask, one will be given to you upon arrival. For doctor visits, patients may have 1 support person aged 31 or older with them. For treatment visits, patients cannot have anyone with them due to current Covid guidelines and our immunocompromised population.

## 2021-04-06 NOTE — Progress Notes (Signed)
Nutrition Follow-up:  Patient with metastatic SCC of esophagus. He completed concurrent chemoradiation therapy 07/14/16. Patient currently receiving FOLFOX.   Met with patient during infusion. He reports decreased appetite. Patient reports eating throughout the day but only eating bites at a time. MD has called in appetite stimulant (Marinol) for patient to try. He is drinking 3 Ensure Enlive daily. Patient likes these and tolerates well. He denies cold sensitivity, altered taste, diarrhea. Patient reports one episode of vomiting after eating pancakes, bacon, egg platter a friend brought over a couple weeks ago. He reports taking a stool softener for constipation. This is working well for him.    Medications: reviewed  Labs: Glucose 131, BUN 24  Anthropometrics: Weight 139 lb 9.6 oz today decreased 7 lbs (4.8%) in 2 weeks and 12 lbs (7.9%) in one month. This is severe.   10/31 - 146 lb 3.2 oz 10/25 - 147 lb 1.3 oz 10/17 - 151 lb 6.4 oz 10/3 - 152 lb 9/16 - 157 lb   NUTRITION DIAGNOSIS: Moderate malnutrition continues. Patient is at risk for worsening nutritional status given weight trends and decreased appetite.  INTERVENTION:  Reinforced importance of adequate calorie and protein energy intake to maintain weights, strength, nutrition  Discussed strategies for poor appetite, encouraged trying to eat small amounts q2 hours after waking Educated to eat high calorie, high protein foods before drinking ONS - pt has handout Recommend increasing Ensure Enlive to QID  Complimentary case of Ensure Enlive provided today Patient provided Ensure Complete to drink during infusion  Take appetite stimulant as prescribed per MD Patient has contact information    MONITORING, EVALUATION, GOAL: weight trends, intake    NEXT VISIT: Thursday December 1 via telephone

## 2021-04-06 NOTE — Patient Instructions (Signed)
[place] at [facility] Discharge Instructions  You were seen and examined today by Dr. Delton Coombes.  Dr. Delton Coombes has refilled your pain medication. Dr. Delton Coombes has also prescribed Megestrol (Megace). This is a liquid to be taken twice daily to stimulate your appetite.    Thank you for choosing [place] at [facility] to provide your oncology and hematology care.  To afford each patient quality time with our provider, please arrive at least 15 minutes before your scheduled appointment time.   If you have a lab appointment with the [facility] please come in thru the Main Entrance and check in at the main information desk.  You need to re-schedule your appointment should you arrive 10 or more minutes late.  We strive to give you quality time with our providers, and arriving late affects you and other patients whose appointments are after yours.  Also, if you no show three or more times for appointments you may be dismissed from the clinic at the providers discretion.     Again, thank you for choosing [facility].  Our hope is that these requests will decrease the amount of time that you wait before being seen by our physicians.       _____________________________________________________________  Should you have questions after your visit to [facility], please contact our office at [phone] and follow the prompts.  Our office hours are 8:00 a.m. and 4:30 p.m. Monday - Friday.  Please note that voicemails left after 4:00 p.m. may not be returned until the following business day.  We are closed weekends and major holidays.  You do have access to a nurse 24-7, just call the main number to the clinic [phone] and do not press any options, hold on the line and a nurse will answer the phone.    For prescription refill requests, have your pharmacy contact our office and allow 72 hours.    Due to Covid, you will  need to wear a mask upon entering the hospital. If you do not have a mask, a mask will be given to you at the Main Entrance upon arrival. For doctor visits, patients may have 1 support person age 71 or older with them. For treatment visits, patients can not have anyone with them due to social distancing guidelines and our immunocompromised population.

## 2021-04-08 ENCOUNTER — Other Ambulatory Visit: Payer: Self-pay

## 2021-04-08 ENCOUNTER — Inpatient Hospital Stay (HOSPITAL_COMMUNITY): Payer: Medicaid Other

## 2021-04-08 VITALS — BP 99/60 | HR 100 | Temp 97.3°F | Resp 18

## 2021-04-08 DIAGNOSIS — Z95828 Presence of other vascular implants and grafts: Secondary | ICD-10-CM

## 2021-04-08 DIAGNOSIS — C155 Malignant neoplasm of lower third of esophagus: Secondary | ICD-10-CM

## 2021-04-08 DIAGNOSIS — Z5112 Encounter for antineoplastic immunotherapy: Secondary | ICD-10-CM | POA: Diagnosis not present

## 2021-04-08 MED ORDER — SODIUM CHLORIDE 0.9% FLUSH
10.0000 mL | INTRAVENOUS | Status: DC | PRN
  Administered 2021-04-08: 10 mL

## 2021-04-08 MED ORDER — HEPARIN SOD (PORK) LOCK FLUSH 100 UNIT/ML IV SOLN
500.0000 [IU] | Freq: Once | INTRAVENOUS | Status: AC | PRN
  Administered 2021-04-08: 500 [IU]

## 2021-04-08 NOTE — Patient Instructions (Signed)
[place]  Discharge Instructions: Thank you for choosing [place] to provide your oncology and hematology care.  If you have a lab appointment with the [facility], please come in thru the Main Entrance and check in at the main information desk.  Wear comfortable clothing and clothing appropriate for easy access to any Portacath or PICC line.   We strive to give you quality time with your provider. You may need to reschedule your appointment if you arrive late (15 or more minutes).  Arriving late affects you and other patients whose appointments are after yours.  Also, if you miss three or more appointments without notifying the office, you may be dismissed from the clinic at the provider's discretion.      For prescription refill requests, have your pharmacy contact our office and allow 72 hours for refills to be completed.    Today you received the following chemotherapy and/or immunotherapy agents Pump stop deaccess      To help prevent nausea and vomiting after your treatment, we encourage you to take your nausea medication as directed.  BELOW ARE SYMPTOMS THAT SHOULD BE REPORTED IMMEDIATELY: *FEVER GREATER THAN 100.4 F (38 C) OR HIGHER *CHILLS OR SWEATING *NAUSEA AND VOMITING THAT IS NOT CONTROLLED WITH YOUR NAUSEA MEDICATION *UNUSUAL SHORTNESS OF BREATH *UNUSUAL BRUISING OR BLEEDING *URINARY PROBLEMS (pain or burning when urinating, or frequent urination) *BOWEL PROBLEMS (unusual diarrhea, constipation, pain near the anus) TENDERNESS IN MOUTH AND THROAT WITH OR WITHOUT PRESENCE OF ULCERS (sore throat, sores in mouth, or a toothache) UNUSUAL RASH, SWELLING OR PAIN  UNUSUAL VAGINAL DISCHARGE OR ITCHING   Items with * indicate a potential emergency and should be followed up as soon as possible or go to the Emergency Department if any problems should occur.  Please show the CHEMOTHERAPY ALERT CARD or IMMUNOTHERAPY ALERT CARD at check-in to the  Emergency Department and triage nurse.  Should you have questions after your visit or need to cancel or reschedule your appointment, please contact [facility] [phone]  and follow the prompts.  Office hours are 8:00 a.m. to 4:30 p.m. Monday - Friday. Please note that voicemails left after 4:00 p.m. may not be returned until the following business day.  We are closed weekends and major holidays. You have access to a nurse at all times for urgent questions. Please call the main number to the clinic [phone] and follow the prompts.  For any non-urgent questions, you may also contact your provider using MyChart. We now offer e-Visits for anyone 63 and older to request care online for non-urgent symptoms. For details visit [email or web address].   Also download the MyChart app! Go to the app store, search "MyChart", open the app, select [place], and log in with your MyChart username and password.  Due to Covid, a mask is required upon entering the hospital/clinic. If you do not have a mask, one will be given to you upon arrival. For doctor visits, patients may have 1 support person aged 63 or older with them. For treatment visits, patients cannot have anyone with them due to current Covid guidelines and our immunocompromised population.

## 2021-04-08 NOTE — Progress Notes (Signed)
Patient presents today for 5FU pump stop and disconnection after 46 hour continous infusion.   5FU pump deaccessed.  Patients port flushed without difficulty.  Good blood return noted with no bruising or swelling noted at site.  needle removed intact.  Band aid applied.  VSS with discharge and left in satisfactory, ambulatory condition  with no s/s of distress noted.

## 2021-04-13 ENCOUNTER — Ambulatory Visit (HOSPITAL_COMMUNITY): Admission: RE | Admit: 2021-04-13 | Payer: Medicaid Other | Source: Ambulatory Visit

## 2021-04-21 NOTE — Progress Notes (Signed)
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  Gleason 3+3 = 6 pattern.   08/14/2014 Initial Diagnosis   Malignant neoplasm of prostate Stanley Jimenez)    - 09/20/2014 Radiation Therapy   Brachytherapy with I-125   Malignant tumor of lower third of esophagus (Stanley Jimenez)  04/14/2016 Procedure   EGD by Stanley Jimenez.   04/16/2016 Pathology Results   Esophagus, biopsy - INVASIVE POORLY DIFFERENTIATED SQUAMOUS CELL CARCINOMA.   04/26/2016 Imaging   CT CAP- 1. Prominent gastroesophageal junction without discrete esophageal mass. Notably, there is also a borderline enlarged enhancing lymph node in the gastrohepatic ligament immediately adjacent to the gastroesophageal junction, which is nonspecific, but could indicate early nodal disease. 2. The small cluster of low-attenuation lesions in segment 7 of the liver are too small to characterize. Statistically, these are likely to represent cysts. If there is clinical concern for metastatic disease to the liver, these could be further characterized with MRI of the abdomen with and without IV gadolinium at this time. 3. No other potential sites of metastatic disease noted elsewhere in the chest, abdomen or pelvis.   05/10/2016 PET scan   Hypermetabolic bilateral lower thoracic esophageal mass, consistent with known primary esophageal  carcinoma.  Small hypermetabolic lymph node in the upper gastrohepatic ligament adjacent to GE junction, consistent with lymph node metastasis.  No other sites of metastatic disease identified.   05/31/2016 Procedure   Port placed by Stanley Jimenez   06/02/2016 - 07/14/2016 Chemotherapy   Weekly Carboplatin/Paclitaxel with XRT.  Tolerated extremely well.    06/02/2016 - 07/14/2016 Radiation Therapy   Stanley Jimenez, .  Tolerated well.   08/23/2016 PET scan   1. Complete metabolic response. No residual hypermetabolism in the thoracic esophagus or in the upper gastrohepatic ligament lymph node, which is decreased in size. 2. Aortic atherosclerosis.  Three-vessel coronary atherosclerosis.   08/24/2016 Miscellaneous   CTS consult with Stanley Jimenez- "I've recommended to him that surgical resection offers the best chance of cure, if he waits and there is recurrent disease remaining no longer be a surgical candidate. From his discussion today I think he is not inclined to agree but it has been offered."   03/09/2017 Imaging   CT CAP: 1. Mild distal esophageal and GE junction thickening, which appears slightly more prominent compared with PET-CT 08/20/2016. Correlation with PET-CT suggested. 2. Negative for pulmonary nodules or metastatic disease to the chest. 3. Stable subcentimeter posterior right hepatic lobe hypodensities. No definite evidence for metastatic disease to the abdomen or pelvis   04/28/2017 PET scan   No evidence of recurrent or metastatic carcinoma.   02/23/2021 -  Chemotherapy   Patient is on Treatment Plan : GASTROESOPHAGEAL FOLFOX + Opdivo q14d x 6 cycles       Jimenez STAGING:  Jimenez Staging  Malignant tumor of lower third of esophagus ([place]) Staging form: Esophagus - Squamous Cell Carcinoma, AJCC 8th Edition -  Clinical stage from 06/15/2016: Stage Unknown (cTX, cN1, cM0, L: Lower) - Signed by Stanley Jimenez on 06/15/2016 - Pathologic stage from 02/17/2021: Stage IVB (rpTX,  pN3, pM1, G3) - Unsigned   INTERVAL HISTORY:  [name], a 65 y.o. male, returns for routine follow-up and consideration for next cycle of chemotherapy. Stanley Jimenez was last seen on 04/06/2021.  Due for cycle #5 of FOLFOX + Opdivo today.   Overall, he tells me he has been feeling pretty well. He reports intermittent CP starting 1 week ago as well as continued abdominal pain; the pain is improved with Percocet 2-3 times daily as needed. He has gained 3 pounds since 11/14. He denies diarrhea. He reports numbness in his hands bilaterally when he touches cold items. His appetite has improved with Megace, and he drinks 3 Boost daily. He reports occasional nausea but denies vomiting.  He reports 1 episode of vision loss in his right eye lasting 20 minutes 4 days ago; he reports his vision is now normal and this has not recurred.   Overall, he feels ready for next cycle of chemo today.   REVIEW OF SYSTEMS:  Review of Systems  Constitutional:  Negative for appetite change (70%), fatigue (60%) and unexpected weight change (+3 lbs).  Eyes:  Positive for eye problems (R eye).  Cardiovascular:  Positive for chest pain (5/10).  Gastrointestinal:  Positive for abdominal pain (5/10), constipation and nausea. Negative for diarrhea and vomiting.  Neurological:  Positive for dizziness and numbness.  All other systems reviewed and are negative.  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HCC)    Clubbing of fingers    congenital   Depression    Full dentures    History of PSVT (paroxysmal supraventricular tachycardia)    run of non-sustatined VT 03-10-2015 in setting of alcohol withdrawal in hospital   History of seizure    03-08-2012  alcohol withdrawal   History of thrombocytopenia    10/ 2013  in setting of alcohol withdrawal   Hyperlipidemia    Hypertension     Port-A-Cath in place 02/20/2021   Prostate Jimenez Stanley Jimenez) urologist-  dr Stanley Jimenez/  oncologist- dr Stanley Jimenez   T1c, Gleason 3+3,  PSA 8.89,  vol 27cc   Psoriasis, 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   Seizures ([place])    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   Squamous cell carcinoma of esophagus ([place]) 04/21/2016   Past Surgical History:  Procedure Laterality Date   BIOPSY  04/14/2016   Procedure: BIOPSY;  Surgeon: Rogene Houston, MD;  [facility];  Service: Endoscopy;;  esophagus   COLONOSCOPY N/A 04/14/2016   Procedure: COLONOSCOPY;  Surgeon: Rogene Houston, MD;  [facility];  Service: Endoscopy;  Laterality: N/A;   ESOPHAGOGASTRODUODENOSCOPY N/A 04/14/2016   Procedure: ESOPHAGOGASTRODUODENOSCOPY (EGD);  Surgeon: Rogene Houston, MD;  [facility]: [facility];  Service: Endoscopy;  Laterality: N/A;  1:00   NO PAST SURGERIES     PORTACATH PLACEMENT Right 05/31/2016   Procedure: INSERTION OF TUNNELED RIGHT INTERNAL JUGULAR BARD POWERPORT CENTRAL VENOUS CATHETER WITH SUBCUTANEOUS PORT;  Surgeon: Vickie Epley, MD;  Location: [facility];  Service: Vascular;  Laterality: Right;   RADIOACTIVE SEED IMPLANT N/A 09/20/2014   Procedure: RADIOACTIVE SEED IMPLANT;  Surgeon: Franchot Gallo, MD;  Location:  [facility];  Service: Urology;  Laterality: N/A;    32   seeds implanted no seeds found in bladder   TRANSURETHRAL RESECTION OF PROSTATE      SOCIAL HISTORY:  Social History   Socioeconomic History   Marital status: Single    Spouse name: Not on file   Number of children: Not on file   Years of education: Not on file   Highest education level: Not on file  Occupational History   Not on file  Tobacco Use   Smoking status: Former    Packs/day: 0.50    Years: 20.00    Pack years: 10.00    Types: Cigarettes    Quit date: 11/12/2020    Years since quitting: 0.4   Smokeless tobacco: Former    Types: Chew    Quit date: 05/27/1984   Tobacco  comments:    only smoking a few  Vaping Use   Vaping Use: Never used  Substance and Sexual Activity   Alcohol use: Not on file    Comment: quit drinking in October 2015. He says before this he says he stayed drunk every day.    Drug use: No    Comment: last use october 2015   Sexual activity: Never    Birth control/protection: None  Other Topics Concern   Not on file  Social History Narrative   Not on file   Social Determinants of Health   Financial Resource Strain: Not on file  Food Insecurity: Not on file  Transportation Needs: Not on file  Physical Activity: Not on file  Stress: Not on file  Social Connections: Not on file  Intimate Partner Violence: Not on file    FAMILY HISTORY:  Family History  Problem Relation Age of Onset   Alcohol abuse Mother    Diabetes Sister     CURRENT MEDICATIONS:  Current Outpatient Medications  Medication Sig Dispense Refill   amLODipine (NORVASC) 10 MG tablet TAKE 1 TABLET BY MOUTH DAILY 90 tablet 1   fluorouracil CALGB 46962 2,400 mg/m2 in sodium chloride 0.9 % 150 mL Inject 2,400 mg/m2 into the vein over 48 hr.     FLUOROURACIL IV Inject into the vein every 14 (fourteen) days.     LEUCOVORIN CALCIUM IV Inject into the vein every 14 (fourteen) days.     megestrol (MEGACE) 400 MG/10ML suspension Take 10 mLs (400 mg total) by mouth 2 (two) times daily. 480 mL 2   OXALIPLATIN IV Inject into the vein every 14 (fourteen) days.     oxyCODONE-acetaminophen (PERCOCET) 10-325 MG tablet Take 1 tablet by mouth every 8 (eight) hours as needed for pain. 84 tablet 0   tamsulosin (FLOMAX) 0.4 MG CAPS capsule TAKE 1 CAPSULE(0.4 MG) BY MOUTH DAILY 90 capsule 1   triamcinolone cream (KENALOG) 0.1 % APPLY TO THE AFFECTED AREA TWICE DAILY 454 g 0   lidocaine-prilocaine (EMLA) cream Apply a small amount to port a cath site and cover with plastic wrap 1 hour prior to chemotherapy appointments (Patient not taking: Reported on 04/22/2021) 30 g 3    prochlorperazine (COMPAZINE) 10 MG tablet Take 1 tablet (10 mg total) by mouth every 6 (six) hours as needed (Nausea or vomiting). (Patient not taking: Reported on 04/22/2021) 30 tablet 1   No current facility-administered medications for this visit.    ALLERGIES:  No Known Allergies  PHYSICAL EXAM:  Performance status (ECOG): 1 - Symptomatic but completely ambulatory  Vitals:   04/22/21 9528  BP: 135/61  Pulse: 77  Resp: 18  Temp: 98.2 F (36.8 C)  SpO2: 100%   Wt Readings from Last 3 Encounters:  04/22/21 142 lb (64.4 kg)  04/06/21 139 lb 9.6 oz (63.3 kg)  03/23/21 146 lb 3.2 oz (66.3 kg)   Physical Exam Vitals reviewed.  Constitutional:      Appearance: Normal appearance.  Cardiovascular:     Rate and Rhythm: Normal rate and regular rhythm.     Pulses: Normal pulses.     Heart sounds: Normal heart sounds.  Pulmonary:     Effort: Pulmonary effort is normal.     Breath sounds: Normal breath sounds.  Chest:     Chest wall: No mass.  Abdominal:     Palpations: Abdomen is soft. There is no hepatomegaly, splenomegaly or mass.     Tenderness: There is no abdominal tenderness.  Neurological:     General: No focal deficit present.     Mental Status: He is alert and oriented to person, place, and time.  Psychiatric:        Mood and Affect: Mood normal.        Behavior: Behavior normal.    LABORATORY DATA:  I have reviewed the labs as listed.  CBC Latest Ref Rng & Units 04/22/2021 04/06/2021 03/23/2021  WBC 4.0 - 10.5 K/uL 3.6(L) 5.3 5.3  Hemoglobin 13.0 - 17.0 g/dL 8.8(L) 9.2(L) 8.8(L)  Hematocrit 39.0 - 52.0 % 27.5(L) 29.8(L) 27.9(L)  Platelets 150 - 400 K/uL 148(L) 172 227   CMP Latest Ref Rng & Units 04/22/2021 04/06/2021 03/23/2021  Glucose 70 - 99 mg/dL 115(H) 131(H) 126(H)  BUN 8 - 23 mg/dL 21 24(H) 19  Creatinine 0.61 - 1.24 mg/dL 0.94 0.89 0.83  Sodium 135 - 145 mmol/L 138 137 132(L)  Potassium 3.5 - 5.1 mmol/L 3.9 3.7 3.7  Chloride 98 - 111 mmol/L 109  105 99  CO2 22 - 32 mmol/L 21(L) 26 24  Calcium 8.9 - 10.3 mg/dL 8.9 8.8(L) 8.7(L)  Total Protein 6.5 - 8.1 g/dL 6.6 6.8 7.2  Total Bilirubin 0.3 - 1.2 mg/dL 0.3 0.7 0.6  Alkaline Phos 38 - 126 U/L 116 149(H) 159(H)  AST 15 - 41 U/L 25 26 34  ALT 0 - 44 U/L _0 DIAGNOSTIC IMAGING:  I have independently reviewed the scans and discussed with the patient. No results found.   ASSESSMENT:  1.  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 - I have reviewed images of the CT [facility] with contrast from 01/16/2021 which showed mild subjective thickening at the GE junction without defined mass.  There is extensive hepatic metastasis, upper abdominal and retroperitoneal adenopathy and widespread metastatic disease to the bones. - Liver lesion biopsy on 02/06/2021 consistent with high-grade poorly differentiated carcinoma (adeno squamous carcinoma). - Caris testing PD-L1 CPS 40%.  HER2 was negative.  MSI is stable.  TMB was low.  Other targetable mutations were negative.   2.  Social/family history: - He lives at home by himself and is independent of ADLs and IADLs.  He worked in [employer] in the past.  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  His sister lives close by. - He smoked half pack per day for  48 years and quit around 11/12/2020. - 2 of his sisters died of Jimenez.  Patient does not know the type.    PLAN:  1.  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 - He had episode of loss of vision in the right eye 4 days ago which lasted about 20 minutes.  Vision normalized.  No other focal deficits noted. - Recommend MRI of the head with  and without gadolinium.  If it happens again, go to the ER. - Reviewed labs today which showed white count is 3.6 with ANC of 1.1.  Last CEA was 288 and trending down.  LFTs were normal.  Creatinine was normal. - He will proceed with his next treatment today.  We will add long-acting G-CSF on Friday when he comes back for discontinuation of pump. - RTC 2 weeks for follow-up.  Plan to repeat scans after cycle 6.   2.  Chest wall and back pain: -Continue Percocet 10/325 2 to 3 tablets daily.  Pain is well controlled.  3.  Weight loss: - Continue Megace 400 mg twice daily.  He gained 3 pounds. - Continue boost 3 times daily.  4.  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  Previous work-up including P71 and folic acid were normal. - Hemoglobin today is 8.8.  We will plan to repeat ferritin and iron panel.  5.  Neuropathy: - He has cold sensitivity in both hands. - He reported left hand numbness on and off.  We will closely monitor.  If any worsening, will consider decreasing oxaliplatin dose.   Orders placed this encounter:  Orders Placed This Encounter  Procedures   Iron and TIBC   Ferritin      Derek Jack, MD [place] [phone]   I, Thana Ates, am acting as a scribe for Dr. Derek Jack.  I, Derek Jack MD, have reviewed the above documentation for accuracy and completeness, and I agree with the above.

## 2021-04-22 ENCOUNTER — Inpatient Hospital Stay (HOSPITAL_COMMUNITY): Payer: Medicaid Other

## 2021-04-22 ENCOUNTER — Inpatient Hospital Stay (HOSPITAL_BASED_OUTPATIENT_CLINIC_OR_DEPARTMENT_OTHER): Payer: Medicaid Other | Admitting: Hematology

## 2021-04-22 ENCOUNTER — Other Ambulatory Visit: Payer: Self-pay

## 2021-04-22 VITALS — BP 135/61 | HR 77 | Temp 98.2°F | Resp 18 | Wt 142.0 lb

## 2021-04-22 VITALS — BP 134/53 | HR 62 | Temp 98.4°F | Resp 18

## 2021-04-22 DIAGNOSIS — C159 Malignant neoplasm of esophagus, unspecified: Secondary | ICD-10-CM

## 2021-04-22 DIAGNOSIS — D649 Anemia, unspecified: Secondary | ICD-10-CM | POA: Diagnosis not present

## 2021-04-22 DIAGNOSIS — K769 Liver disease, unspecified: Secondary | ICD-10-CM

## 2021-04-22 DIAGNOSIS — C155 Malignant neoplasm of lower third of esophagus: Secondary | ICD-10-CM

## 2021-04-22 DIAGNOSIS — Z95828 Presence of other vascular implants and grafts: Secondary | ICD-10-CM

## 2021-04-22 DIAGNOSIS — Z5112 Encounter for antineoplastic immunotherapy: Secondary | ICD-10-CM | POA: Diagnosis not present

## 2021-04-22 LAB — COMPREHENSIVE METABOLIC PANEL
ALT: 27 U/L (ref 0–44)
AST: 25 U/L (ref 15–41)
Albumin: 3 g/dL — ABNORMAL LOW (ref 3.5–5.0)
Alkaline Phosphatase: 116 U/L (ref 38–126)
Anion gap: 8 (ref 5–15)
BUN: 21 mg/dL (ref 8–23)
CO2: 21 mmol/L — ABNORMAL LOW (ref 22–32)
Calcium: 8.9 mg/dL (ref 8.9–10.3)
Chloride: 109 mmol/L (ref 98–111)
Creatinine, Ser: 0.94 mg/dL (ref 0.61–1.24)
GFR, Estimated: >60 mL/min (ref >60)
Glucose, Bld: 115 mg/dL — ABNORMAL HIGH (ref 70–99)
Potassium: 3.9 mmol/L (ref 3.5–5.1)
Sodium: 138 mmol/L (ref 135–145)
Total Bilirubin: 0.3 mg/dL (ref 0.3–1.2)
Total Protein: 6.6 g/dL (ref 6.5–8.1)

## 2021-04-22 LAB — MAGNESIUM: Magnesium: 2.1 mg/dL (ref 1.7–2.4)

## 2021-04-22 MED ORDER — SODIUM CHLORIDE 0.9 % IV SOLN
2400.0000 mg/m2 | INTRAVENOUS | Status: DC
  Administered 2021-04-22: 4400 mg via INTRAVENOUS
  Filled 2021-04-22: qty 88

## 2021-04-22 MED ORDER — FLUOROURACIL CHEMO INJECTION 2.5 GM/50ML
400.0000 mg/m2 | Freq: Once | INTRAVENOUS | Status: AC
  Administered 2021-04-22: 750 mg via INTRAVENOUS
  Filled 2021-04-22: qty 15

## 2021-04-22 MED ORDER — SODIUM CHLORIDE 0.9% FLUSH: 10.0000 mL | INTRAVENOUS | Status: DC | PRN

## 2021-04-22 MED ORDER — DEXTROSE 5 % IV SOLN: Freq: Once | INTRAVENOUS | Status: AC

## 2021-04-22 MED ORDER — SODIUM CHLORIDE 0.9 % IV SOLN
240.0000 mg | Freq: Once | INTRAVENOUS | Status: AC
  Administered 2021-04-22: 240 mg via INTRAVENOUS
  Filled 2021-04-22: qty 24

## 2021-04-22 MED ORDER — OXALIPLATIN CHEMO INJECTION 100 MG/20ML
82.0000 mg/m2 | Freq: Once | INTRAVENOUS | Status: AC
  Administered 2021-04-22: 150 mg via INTRAVENOUS
  Filled 2021-04-22: qty 20

## 2021-04-22 MED ORDER — HEPARIN SOD (PORK) LOCK FLUSH 100 UNIT/ML IV SOLN: 500.0000 [IU] | Freq: Once | INTRAVENOUS | Status: DC | PRN

## 2021-04-22 MED ORDER — LEUCOVORIN CALCIUM INJECTION 350 MG
400.0000 mg/m2 | Freq: Once | INTRAVENOUS | Status: AC
  Administered 2021-04-22: 736 mg via INTRAVENOUS
  Filled 2021-04-22: qty 36.8

## 2021-04-22 MED ORDER — PALONOSETRON HCL INJECTION 0.25 MG/5ML
0.2500 mg | Freq: Once | INTRAVENOUS | Status: AC
  Administered 2021-04-22: 0.25 mg via INTRAVENOUS
  Filled 2021-04-22: qty 5

## 2021-04-22 MED ORDER — SODIUM CHLORIDE 0.9 % IV SOLN
10.0000 mg | Freq: Once | INTRAVENOUS | Status: AC
  Administered 2021-04-22: 10 mg via INTRAVENOUS
  Filled 2021-04-22: qty 10

## 2021-04-22 NOTE — Patient Instructions (Addendum)
[facility]  Discharge Instructions: Thank you for choosing [facility] to provide your oncology and hematology care.  If you have a lab appointment with the [facility], please come in thru the Main Entrance and check in at the main information desk.  Wear comfortable clothing and clothing appropriate for easy access to any Portacath or PICC line.   We strive to give you quality time with your provider. You may need to reschedule your appointment if you arrive late (15 or more minutes).  Arriving late affects you and other patients whose appointments are after yours.  Also, if you miss three or more appointments without notifying the office, you may be dismissed from the clinic at the provider's discretion.      For prescription refill requests, have your pharmacy contact our office and allow 72 hours for refills to be completed.     The chemotherapy medication bag should finish at 46 hours, 96 hours, or 7 days. For example, if your pump is scheduled for 46 hours and it was put on at 4:00 p.m., it should finish at 2:00 p.m. the day it is scheduled to come off regardless of your appointment time.     Estimated time to finish at 1330.   If the display on your pump reads "Low Volume" and it is beeping, take the batteries out of the pump and come to the cancer center for it to be taken off.   If the pump alarms go off prior to the pump reading "Low Volume" then call [phone] and someone can assist you.  If the plunger comes out and the chemotherapy medication is leaking out, please use your home chemo spill kit to clean up the spill. Do NOT use paper towels or other household products.  If you have problems or questions regarding your pump, please call either [phone] (24 hours a day) or the cancer center Monday-Friday 8:00 a.m.- 4:30 p.m. at the clinic number and we will assist you. If you are unable to get assistance, then go to the nearest Emergency  Department and ask the staff to contact the IV team for assistance.   To help prevent nausea and vomiting after your treatment, we encourage you to take your nausea medication as directed.  BELOW ARE SYMPTOMS THAT SHOULD BE REPORTED IMMEDIATELY: *FEVER GREATER THAN 100.4 F (38 C) OR HIGHER *CHILLS OR SWEATING *NAUSEA AND VOMITING THAT IS NOT CONTROLLED WITH YOUR NAUSEA MEDICATION *UNUSUAL SHORTNESS OF BREATH *UNUSUAL BRUISING OR BLEEDING *URINARY PROBLEMS (pain or burning when urinating, or frequent urination) *BOWEL PROBLEMS (unusual diarrhea, constipation, pain near the anus) TENDERNESS IN MOUTH AND THROAT WITH OR WITHOUT PRESENCE OF ULCERS (sore throat, sores in mouth, or a toothache) UNUSUAL RASH, SWELLING OR PAIN  UNUSUAL VAGINAL DISCHARGE OR ITCHING   Items with * indicate a potential emergency and should be followed up as soon as possible or go to the Emergency Department if any problems should occur.  Please show the CHEMOTHERAPY ALERT CARD or IMMUNOTHERAPY ALERT CARD at check-in to the Emergency Department and triage nurse.  Should you have questions after your visit or need to cancel or reschedule your appointment, please contact [facility] [phone]  and follow the prompts.  Office hours are 8:00 a.m. to 4:30 p.m. Monday - Friday. Please note that voicemails left after 4:00 p.m. may not be returned until the following business day.  We are closed weekends and major holidays. You have access to a nurse  at all times for urgent questions. Please call the main number to the clinic [phone] and follow the prompts.  For any non-urgent questions, you may also contact your provider using MyChart. We now offer e-Visits for anyone 35 and older to request care online for non-urgent symptoms. For details visit [email or web address].   Also download the MyChart app! Go to the app store, search "MyChart", open the app, select [facility], and log in with your MyChart  username and password.  Due to Covid, a mask is required upon entering the hospital/clinic. If you do not have a mask, one will be given to you upon arrival. For doctor visits, patients may have 1 support person aged 51 or older with them. For treatment visits, patients cannot have anyone with them due to current Covid guidelines and our immunocompromised population.

## 2021-04-22 NOTE — Patient Instructions (Signed)
[facility] at [facility] Discharge Instructions   You were seen and examined today by Dr. Delton Coombes. We will proceed with your treatment today. Try to increase the protein in your diet - meat, chicken, and eggs. Please let us know if the numbness in your hands gets worse. Return as scheduled in 2 weeks for lab work and treatment.    Thank you for choosing [facility] at [facility] to provide your oncology and hematology care.  To afford each patient quality time with our provider, please arrive at least 15 minutes before your scheduled appointment time.   If you have a lab appointment with the [facility] please come in thru the Main Entrance and check in at the main information desk.  You need to re-schedule your appointment should you arrive 10 or more minutes late.  We strive to give you quality time with our providers, and arriving late affects you and other patients whose appointments are after yours.  Also, if you no show three or more times for appointments you may be dismissed from the clinic at the providers discretion.     Again, thank you for choosing [facility].  Our hope is that these requests will decrease the amount of time that you wait before being seen by our physicians.       _____________________________________________________________  Should you have questions after your visit to [facility], please contact our office at [phone] and follow the prompts.  Our office hours are 8:00 a.m. and 4:30 p.m. Monday - Friday.  Please note that voicemails left after 4:00 p.m. may not be returned until the following business day.  We are closed weekends and major holidays.  You do have access to a nurse 24-7, just call the main number to the clinic [phone] and do not press any options, hold on the line and a nurse will answer the phone.    For prescription refill requests, have your pharmacy contact  our office and allow 72 hours.    Due to Covid, you will need to wear a mask upon entering the hospital. If you do not have a mask, a mask will be given to you at the Main Entrance upon arrival. For doctor visits, patients may have 1 support person age 19 or older with them. For treatment visits, patients can not have anyone with them due to social distancing guidelines and our immunocompromised population.

## 2021-04-22 NOTE — Progress Notes (Signed)
Patient has been examined, vital signs and labs have been reviewed by Dr. Katragadda. ANC, Creatinine, LFTs, hemoglobin, and platelets are within treatment parameters per Dr. Katragadda. Patient may proceed with treatment per M.D.   

## 2021-04-22 NOTE — Progress Notes (Signed)
Patient presents today for chemotherapy treatment.  Patient is in satisfactory condition with no new complaints voiced.  Vital signs are stable.  Labs reviewed by Dr. Delton Coombes during his office visit.  WBC today is 3.6 and ANC is 1.1 and out of treatment parameters.  Dr. Delton Coombes notified.  We will proceed with treatment per MD orders.   Patient tolerated treatment well with no complaints voiced.  Patient connected to the 5FU home infusion pump and verbalized understanding of the pump.  Patient left ambulatory in stable condition.  Vital signs stable at discharge.  Follow up as scheduled.

## 2021-04-22 NOTE — Progress Notes (Signed)
Received orders from MD to add Udenyca 6 mg SQ to day 2 with pump DC of each treatment plan.  Orders updated to reflect above.  T.O. Dr Rhys Martini, PharmD

## 2021-04-23 LAB — CEA: CEA: 187 ng/mL — ABNORMAL HIGH (ref 0.0–4.7)

## 2021-04-24 ENCOUNTER — Inpatient Hospital Stay (HOSPITAL_COMMUNITY): Payer: Medicaid Other | Attending: Hematology

## 2021-04-24 ENCOUNTER — Other Ambulatory Visit: Payer: Self-pay

## 2021-04-24 VITALS — BP 127/71 | HR 94 | Temp 97.2°F | Resp 18 | Ht 71.0 in | Wt 138.8 lb

## 2021-04-24 DIAGNOSIS — Z5111 Encounter for antineoplastic chemotherapy: Secondary | ICD-10-CM | POA: Diagnosis present

## 2021-04-24 DIAGNOSIS — Z79899 Other long term (current) drug therapy: Secondary | ICD-10-CM | POA: Insufficient documentation

## 2021-04-24 DIAGNOSIS — C787 Secondary malignant neoplasm of liver and intrahepatic bile duct: Secondary | ICD-10-CM | POA: Diagnosis not present

## 2021-04-24 DIAGNOSIS — C155 Malignant neoplasm of lower third of esophagus: Secondary | ICD-10-CM | POA: Diagnosis present

## 2021-04-24 DIAGNOSIS — Z5112 Encounter for antineoplastic immunotherapy: Secondary | ICD-10-CM | POA: Diagnosis present

## 2021-04-24 DIAGNOSIS — Z5189 Encounter for other specified aftercare: Secondary | ICD-10-CM | POA: Diagnosis not present

## 2021-04-24 DIAGNOSIS — Z95828 Presence of other vascular implants and grafts: Secondary | ICD-10-CM

## 2021-04-24 DIAGNOSIS — C7951 Secondary malignant neoplasm of bone: Secondary | ICD-10-CM | POA: Diagnosis not present

## 2021-04-24 DIAGNOSIS — C772 Secondary and unspecified malignant neoplasm of intra-abdominal lymph nodes: Secondary | ICD-10-CM | POA: Diagnosis not present

## 2021-04-24 LAB — CBC WITH DIFFERENTIAL/PLATELET
Abs Immature Granulocytes: 0.02 10*3/uL (ref 0.00–0.07)
Basophils Absolute: 0 10*3/uL (ref 0.0–0.1)
Basophils Relative: 1 %
Eosinophils Absolute: 0.1 10*3/uL (ref 0.0–0.5)
Eosinophils Relative: 1 %
HCT: 27.5 % — ABNORMAL LOW (ref 39.0–52.0)
Hemoglobin: 8.8 g/dL — ABNORMAL LOW (ref 13.0–17.0)
Immature Granulocytes: 1 %
Lymphocytes Relative: 36 %
Lymphs Abs: 1.3 10*3/uL (ref 0.7–4.0)
MCH: 26.6 pg (ref 26.0–34.0)
MCHC: 32 g/dL (ref 30.0–36.0)
MCV: 83.1 fL (ref 80.0–100.0)
Monocytes Absolute: 1.1 10*3/uL — ABNORMAL HIGH (ref 0.1–1.0)
Monocytes Relative: 30 %
Neutro Abs: 1.1 10*3/uL — ABNORMAL LOW (ref 1.7–7.7)
Neutrophils Relative %: 31 %
Platelets: 148 10*3/uL — ABNORMAL LOW (ref 150–400)
RBC: 3.31 MIL/uL — ABNORMAL LOW (ref 4.22–5.81)
RDW: 22.9 % — ABNORMAL HIGH (ref 11.5–15.5)
WBC: 3.6 10*3/uL — ABNORMAL LOW (ref 4.0–10.5)
nRBC: 0 % (ref 0.0–0.2)

## 2021-04-24 MED ORDER — HEPARIN SOD (PORK) LOCK FLUSH 100 UNIT/ML IV SOLN
500.0000 [IU] | Freq: Once | INTRAVENOUS | Status: AC | PRN
  Administered 2021-04-24: 500 [IU]

## 2021-04-24 MED ORDER — SODIUM CHLORIDE 0.9% FLUSH
10.0000 mL | INTRAVENOUS | Status: DC | PRN
  Administered 2021-04-24: 10 mL

## 2021-04-24 MED ORDER — PEGFILGRASTIM-CBQV 6 MG/0.6ML ~~LOC~~ SOSY
6.0000 mg | PREFILLED_SYRINGE | Freq: Once | SUBCUTANEOUS | Status: AC
  Administered 2021-04-24: 6 mg via SUBCUTANEOUS
  Filled 2021-04-24: qty 0.6

## 2021-04-24 NOTE — Patient Instructions (Signed)
[facility]  Discharge Instructions: Thank you for choosing [facility] to provide your oncology and hematology care.  If you have a lab appointment with the [facility], please come in thru the Main Entrance and check in at the main information desk.  Wear comfortable clothing and clothing appropriate for easy access to any Portacath or PICC line.   We strive to give you quality time with your provider. You may need to reschedule your appointment if you arrive late (15 or more minutes).  Arriving late affects you and other patients whose appointments are after yours.  Also, if you miss three or more appointments without notifying the office, you may be dismissed from the clinic at the provider's discretion.      For prescription refill requests, have your pharmacy contact our office and allow 72 hours for refills to be completed.    Today you received Udenyca and chemo pump disconnection.     BELOW ARE SYMPTOMS THAT SHOULD BE REPORTED IMMEDIATELY: *FEVER GREATER THAN 100.4 F (38 C) OR HIGHER *CHILLS OR SWEATING *NAUSEA AND VOMITING THAT IS NOT CONTROLLED WITH YOUR NAUSEA MEDICATION *UNUSUAL SHORTNESS OF BREATH *UNUSUAL BRUISING OR BLEEDING *URINARY PROBLEMS (pain or burning when urinating, or frequent urination) *BOWEL PROBLEMS (unusual diarrhea, constipation, pain near the anus) TENDERNESS IN MOUTH AND THROAT WITH OR WITHOUT PRESENCE OF ULCERS (sore throat, sores in mouth, or a toothache) UNUSUAL RASH, SWELLING OR PAIN  UNUSUAL VAGINAL DISCHARGE OR ITCHING   Items with * indicate a potential emergency and should be followed up as soon as possible or go to the Emergency Department if any problems should occur.  Please show the CHEMOTHERAPY ALERT CARD or IMMUNOTHERAPY ALERT CARD at check-in to the Emergency Department and triage nurse.  Should you have questions after your visit or need to cancel or reschedule your appointment, please contact [facility] [phone]  and follow the prompts.  Office hours are 8:00 a.m. to 4:30 p.m. Monday - Friday. Please note that voicemails left after 4:00 p.m. may not be returned until the following business day.  We are closed weekends and major holidays. You have access to a nurse at all times for urgent questions. Please call the main number to the clinic [phone] and follow the prompts.  For any non-urgent questions, you may also contact your provider using MyChart. We now offer e-Visits for anyone 65 and older to request care online for non-urgent symptoms. For details visit [email or web address].   Also download the MyChart app! Go to the app store, search "MyChart", open the app, select [facility], and log in with your MyChart username and password.  Due to Covid, a mask is required upon entering the hospital/clinic. If you do not have a mask, one will be given to you upon arrival. For doctor visits, patients may have 1 support person aged 5 or older with them. For treatment visits, patients cannot have anyone with them due to current Covid guidelines and our immunocompromised population.

## 2021-04-24 NOTE — Progress Notes (Signed)
Stanley Jimenez presents today for injection per the provider's orders.  Udenyca administration without incident; injection site WNL; see MAR for injection details.  5FU chemotherapy pump disconnection. Port flushed easily without diffculty with 10 ml of NS and 5 ml of heparin. Good blood return noted. Needle removed intact no bruising or swelling noted at the site  Patient tolerated procedure well and without incident.  No questions or complaints noted at this time.   Udenyca and  5FU chemotherapy pump disconnection done today per MD orders. Tolerated infusion without adverse affects. Vital signs stable. No complaints at this time. Discharged from clinic ambulatory in stable condition. Alert and oriented x 3. F/U with [facility] as scheduled.

## 2021-04-25 DIAGNOSIS — C159 Malignant neoplasm of esophagus, unspecified: Secondary | ICD-10-CM | POA: Diagnosis not present

## 2021-05-04 ENCOUNTER — Ambulatory Visit (HOSPITAL_COMMUNITY)
Admission: RE | Admit: 2021-05-04 | Discharge: 2021-05-04 | Disposition: A | Discharge status: Home / Self Care | Payer: Medicaid Other | Source: Ambulatory Visit | Attending: Hematology | Admitting: Hematology

## 2021-05-04 ENCOUNTER — Other Ambulatory Visit: Payer: Self-pay

## 2021-05-04 DIAGNOSIS — G9389 Other specified disorders of brain: Secondary | ICD-10-CM | POA: Diagnosis not present

## 2021-05-04 DIAGNOSIS — G319 Degenerative disease of nervous system, unspecified: Secondary | ICD-10-CM | POA: Diagnosis not present

## 2021-05-04 DIAGNOSIS — C159 Malignant neoplasm of esophagus, unspecified: Secondary | ICD-10-CM

## 2021-05-04 MED ORDER — GADOBUTROL 1 MMOL/ML IV SOLN
7.0000 mL | Freq: Once | INTRAVENOUS | Status: AC | PRN
  Administered 2021-05-04: 7 mL via INTRAVENOUS

## 2021-05-05 NOTE — Progress Notes (Signed)
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  Gleason 3+3 = 6 pattern.   08/14/2014 Initial Diagnosis   Malignant neoplasm of prostate [facility])    - 09/20/2014 Radiation Therapy   Brachytherapy with I-125   Malignant tumor of lower third of esophagus ([place])  04/14/2016 Procedure   EGD by Dr. Laural Golden.   04/16/2016 Pathology Results   Esophagus, biopsy - INVASIVE POORLY DIFFERENTIATED SQUAMOUS CELL CARCINOMA.   04/26/2016 Imaging   CT CAP- 1. Prominent gastroesophageal junction without discrete esophageal mass. Notably, there is also a borderline enlarged enhancing lymph node in the gastrohepatic ligament immediately adjacent to the gastroesophageal junction, which is nonspecific, but could indicate early nodal disease. 2. The small cluster of low-attenuation lesions in segment 7 of the liver are too small to characterize. Statistically, these are likely to represent cysts. If there is clinical concern for metastatic disease to the liver, these could be further characterized with MRI of the abdomen with and without IV gadolinium at this time. 3. No other potential sites of metastatic disease noted elsewhere in the chest, abdomen or pelvis.   05/10/2016 PET scan   Hypermetabolic bilateral lower thoracic esophageal mass, consistent with known primary esophageal  carcinoma.  Small hypermetabolic lymph node in the upper gastrohepatic ligament adjacent to GE junction, consistent with lymph node metastasis.  No other sites of metastatic disease identified.   05/31/2016 Procedure   Port placed by Dr. Rosana Hoes   06/02/2016 - 07/14/2016 Chemotherapy   Weekly Carboplatin/Paclitaxel with XRT.  Tolerated extremely well.    06/02/2016 - 07/14/2016 Radiation Therapy   [place], [place].  Tolerated well.   08/23/2016 PET scan   1. Complete metabolic response. No residual hypermetabolism in the thoracic esophagus or in the upper gastrohepatic ligament lymph node, which is decreased in size. 2. Aortic atherosclerosis.  Three-vessel coronary atherosclerosis.   08/24/2016 Miscellaneous   CTS consult with Dr. Servando Snare- "I've recommended to him that surgical resection offers the best chance of cure, if he waits and there is recurrent disease remaining no longer be a surgical candidate. From his discussion today I think he is not inclined to agree but it has been offered."   03/09/2017 Imaging   CT CAP: 1. Mild distal esophageal and GE junction thickening, which appears slightly more prominent compared with PET-CT 08/20/2016. Correlation with PET-CT suggested. 2. Negative for pulmonary nodules or metastatic disease to the chest. 3. Stable subcentimeter posterior right hepatic lobe hypodensities. No definite evidence for metastatic disease to the abdomen or pelvis   04/28/2017 PET scan   No evidence of recurrent or metastatic carcinoma.   02/23/2021 -  Chemotherapy   Patient is on Treatment Plan : GASTROESOPHAGEAL FOLFOX + Opdivo q14d x 6 cycles       CANCER STAGING:  Cancer Staging  Malignant tumor of lower third of esophagus ([place]) Staging form: Esophagus - Squamous Cell Carcinoma, AJCC 8th Edition -  Clinical stage from 06/15/2016: Stage Unknown (cTX, cN1, cM0, L: Lower) - Signed by Baird Cancer, PA-C on 06/15/2016 - Pathologic stage from 02/17/2021: Stage IVB (rpTX,  pN3, pM1, G3) - Unsigned   INTERVAL HISTORY:  Stanley Jimenez, a 65 y.o. male, returns for routine follow-up and consideration for next cycle of chemotherapy. Stanley Jimenez was last seen on 04/22/2021.  Due for cycle #6 of FOLFOX + Opdivo today.   Overall, he tells me he has been feeling pretty well. He has lost 6 pounds since 11/30. He is drinking 0-2 Ensure daily. He is not currently taking aspirin. He reports his hands, feet, and face feel numb when exposed to cold; this began to occur after starting treatment and last for 2 week following treatment. His appetite has improved. He denies cough. He denies mouth sores and diarrhea.  Overall, he feels ready for next cycle of chemo today.    REVIEW OF SYSTEMS:  Review of Systems  Constitutional:  Negative for appetite change (75%) and fatigue (75%).  HENT:   Negative for mouth sores.   Respiratory:  Negative for cough.   Cardiovascular:  Positive for chest pain.  Gastrointestinal:  Positive for abdominal pain (4/10), nausea and vomiting. Negative for diarrhea.  Neurological:  Positive for numbness.  All other systems reviewed and are negative.  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HCC)    Clubbing of fingers    congenital   Depression    Full dentures    History of PSVT (paroxysmal supraventricular tachycardia)    run of non-sustatined VT 03-10-2015 in setting of alcohol withdrawal in hospital   History of seizure    03-08-2012  alcohol withdrawal   History of thrombocytopenia    10/ 2013  in setting of alcohol withdrawal   Hyperlipidemia    Hypertension    Port-A-Cath in place 02/20/2021   Prostate cancer [facility]) urologist-  dr dalhstedt/  oncologist- dr Tammi Klippel   T1c, Gleason 3+3,  PSA 8.89,  vol 27cc   Psoriasis, 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   Seizures ([place])    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 but none since  stopped drinking in 2016   Squamous cell carcinoma of esophagus ([place]) 04/21/2016   Past Surgical History:  Procedure Laterality Date   BIOPSY  04/14/2016   Procedure: BIOPSY;  Surgeon: Rogene Houston, MD;  [facility];  Service: Endoscopy;;  esophagus   COLONOSCOPY N/A 04/14/2016   Procedure: COLONOSCOPY;  Surgeon: Rogene Houston, MD;  [facility];  Service: Endoscopy;  Laterality: N/A;   ESOPHAGOGASTRODUODENOSCOPY N/A 04/14/2016   Procedure: ESOPHAGOGASTRODUODENOSCOPY (EGD);  Surgeon: Rogene Houston, MD;  Location[facility];  Service: Endoscopy;  Laterality: N/A;  1:00   NO PAST SURGERIES     PORTACATH PLACEMENT Right 05/31/2016   Procedure: INSERTION OF TUNNELED RIGHT INTERNAL JUGULAR BARD POWERPORT CENTRAL VENOUS CATHETER WITH SUBCUTANEOUS PORT;  Surgeon: Vickie Epley, MD;  Location: [facility];  Service: Vascular;  Laterality: Right;   RADIOACTIVE SEED IMPLANT N/A 09/20/2014   Procedure: RADIOACTIVE SEED IMPLANT;  Surgeon: Franchot Gallo, MD;  [facility];  Service: Urology;  Laterality: N/A;    79   seeds implanted no seeds found in bladder   TRANSURETHRAL RESECTION OF PROSTATE      SOCIAL HISTORY:  Social History  Socioeconomic History   Marital status: Single    Spouse name: Not on file   Number of children: Not on file   Years of education: Not on file   Highest education level: Not on file  Occupational History   Not on file  Tobacco Use   Smoking status: Former    Packs/day: 0.50    Years: 20.00    Pack years: 10.00    Types: Cigarettes    Quit date: 11/12/2020    Years since quitting: 0.4   Smokeless tobacco: Former    Types: Chew    Quit date: 05/27/1984   Tobacco comments:    only smoking a few  Vaping Use   Vaping Use: Never used  Substance and Sexual Activity   Alcohol use: Not on file    Comment: quit drinking in October 2015. He says before this he says he stayed drunk every day.    Drug use: No     Comment: last use october 2015   Sexual activity: Never    Birth control/protection: None  Other Topics Concern   Not on file  Social History Narrative   Not on file   Social Determinants of Health   Financial Resource Strain: Not on file  Food Insecurity: Not on file  Transportation Needs: Not on file  Physical Activity: Not on file  Stress: Not on file  Social Connections: Not on file  Intimate Partner Violence: Not on file    FAMILY HISTORY:  Family History  Problem Relation Age of Onset   Alcohol abuse Mother    Diabetes Sister     CURRENT MEDICATIONS:  Current Outpatient Medications  Medication Sig Dispense Refill   amLODipine (NORVASC) 10 MG tablet TAKE 1 TABLET BY MOUTH DAILY 90 tablet 1   fluorouracil CALGB 16109 2,400 mg/m2 in sodium chloride 0.9 % 150 mL Inject 2,400 mg/m2 into the vein over 48 hr.     FLUOROURACIL IV Inject into the vein every 14 (fourteen) days.     LEUCOVORIN CALCIUM IV Inject into the vein every 14 (fourteen) days.     lidocaine-prilocaine (EMLA) cream Apply a small amount to port a cath site and cover with plastic wrap 1 hour prior to chemotherapy appointments (Patient not taking: Reported on 04/24/2021) 30 g 3   megestrol (MEGACE) 400 MG/10ML suspension Take 10 mLs (400 mg total) by mouth 2 (two) times daily. 480 mL 2   OXALIPLATIN IV Inject into the vein every 14 (fourteen) days.     oxyCODONE-acetaminophen (PERCOCET) 10-325 MG tablet Take 1 tablet by mouth every 8 (eight) hours as needed for pain. 84 tablet 0   prochlorperazine (COMPAZINE) 10 MG tablet Take 1 tablet (10 mg total) by mouth every 6 (six) hours as needed (Nausea or vomiting). (Patient not taking: Reported on 05/06/2021) 30 tablet 1   tamsulosin (FLOMAX) 0.4 MG CAPS capsule TAKE 1 CAPSULE(0.4 MG) BY MOUTH DAILY 90 capsule 1   triamcinolone cream (KENALOG) 0.1 % APPLY TO THE AFFECTED AREA TWICE DAILY 454 g 0   No current facility-administered medications for this visit.     ALLERGIES:  No Known Allergies  PHYSICAL EXAM:  Performance status (ECOG): 1 - Symptomatic but completely ambulatory  Vitals:   05/06/21 0838  BP: 112/60  Pulse: 88  Resp: 18  Temp: (!) 97.5 F (36.4 C)  SpO2: 100%   Wt Readings from Last 3 Encounters:  05/06/21 136 lb (61.7 kg)  04/24/21 138 lb 12.8 oz (63 kg)  04/22/21 142 lb (64.4 kg)   Physical Exam Vitals reviewed.  Constitutional:      Appearance: Normal appearance.  Cardiovascular:     Rate and Rhythm: Normal rate and regular rhythm.     Pulses: Normal pulses.     Heart sounds: Normal heart sounds.  Pulmonary:     Effort: Pulmonary effort is normal.     Breath sounds: Normal breath sounds.  Neurological:     General: No focal deficit present.     Mental Status: He is alert and oriented to person, place, and time.  Psychiatric:        Mood and Affect: Mood normal.        Behavior: Behavior normal.    LABORATORY DATA:  I have reviewed the labs as listed.  CBC Latest Ref Rng & Units 05/06/2021 04/22/2021 04/06/2021  WBC 4.0 - 10.5 K/uL 14.9(H) 3.6(L) 5.3  Hemoglobin 13.0 - 17.0 g/dL 9.1(L) 8.8(L) 9.2(L)  Hematocrit 39.0 - 52.0 % 28.4(L) 27.5(L) 29.8(L)  Platelets 150 - 400 K/uL 102(L) 148(L) 172   CMP Latest Ref Rng & Units 05/06/2021 04/22/2021 04/06/2021  Glucose 70 - 99 mg/dL 141(H) 115(H) 131(H)  BUN 8 - 23 mg/dL 14 21 24(H)  Creatinine 0.61 - 1.24 mg/dL 0.92 0.94 0.89  Sodium 135 - 145 mmol/L 136 138 137  Potassium 3.5 - 5.1 mmol/L 3.8 3.9 3.7  Chloride 98 - 111 mmol/L 106 109 105  CO2 22 - 32 mmol/L 21(L) 21(L) 26  Calcium 8.9 - 10.3 mg/dL 8.7(L) 8.9 8.8(L)  Total Protein 6.5 - 8.1 g/dL 6.9 6.6 6.8  Total Bilirubin 0.3 - 1.2 mg/dL 0.3 0.3 0.7  Alkaline Phos 38 - 126 U/L 173(H) 116 149(H)  AST 15 - 41 U/L _0 ALT 0 - 44 U/L _1 DIAGNOSTIC IMAGING:  I have independently reviewed the scans and discussed with the patient. MR Brain W Wo Contrast  Result Date:  05/05/2021 CLINICAL DATA:  Squamous cell carcinoma the esophagus. Evaluation for metastatic disease. Temporary right eye vision loss. EXAM: MRI HEAD WITHOUT AND WITH CONTRAST TECHNIQUE: Multiplanar, multiecho pulse sequences of the brain and surrounding structures were obtained without and with intravenous contrast. CONTRAST:  54m GADAVIST GADOBUTROL 1 MMOL/ML IV SOLN COMPARISON:  Head CT 03/08/2012 FINDINGS: Brain: There are numerous punctate foci of trace diffusion weighted signal hyperintensity scattered throughout both cerebral hemispheres in multiple vascular territories, predominantly cortically based, as well as in the left splenium of the corpus callosum and bilateral cerebellar hemispheres. Their small size limits correlation for reduced ADC. There are also numerous subcentimeter foci of both punctate and gyriform cortical enhancement scattered throughout both cerebral hemispheres separate from the aforementioned foci of diffusion abnormality. There is no significant associated edema. Small T2 hyperintensities scattered throughout the cerebral white matter bilaterally are nonspecific but compatible with mild chronic small vessel ischemic disease. There is a chronic lacunar infarct in the left thalamus with associated chronic blood products. A chronic microhemorrhage is noted in the right thalamus. Small chronic infarcts are noted in both cerebellar hemispheres. There is mild cerebral atrophy. No midline shift or extra-axial fluid collection is present. Vascular: Major intracranial arterial flow voids are preserved. The major dural venous sinuses are enhancing. Skull and upper cervical spine: Enhancing lesions in the clivus and C3 vertebral body. Sinuses/Orbits: Unremarkable orbits. Mild mucosal thickening in the ethmoid sinuses. Trace left mastoid fluid. Other: None. IMPRESSION: 1. Numerous small foci of diffusion abnormality in the cerebrum and  cerebellum as well as multiple separate small foci of  cerebral cortical enhancement. These are favored to reflect acute and subacute embolic infarcts, however a short-term follow-up MRI is recommended in 6 weeks to exclude metastatic disease. 2. Osseous metastases in the clivus and cervical spine. 3. Mild chronic small vessel ischemic disease. 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 Electronically Signed   By: Logan Bores M.D.   On: 05/05/2021 10:18     ASSESSMENT:  1.  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 - I have reviewed images of the CT AP with contrast from 01/16/2021 which showed mild subjective thickening at the GE junction without defined mass.  There is extensive hepatic metastasis, upper abdominal and retroperitoneal adenopathy and widespread metastatic disease to the bones. - Liver lesion biopsy on 02/06/2021 consistent with high-grade poorly differentiated carcinoma (adeno squamous carcinoma). - Caris testing PD-L1 CPS 40%.  HER2 was negative.  MSI is stable.  TMB was low.  Other targetable mutations were negative.   2.  Social/family history: - He lives at home by himself and is independent of ADLs and IADLs.  He worked in [employer] in the past.  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  His sister lives close by. - He smoked half pack per day for 48 years and quit around 2020-12-01. - 2 of his sisters died of cancer.  Patient does not know the type.   PLAN:  1.  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 - He did not report any GI symptoms including nausea vomiting or diarrhea. - I have reviewed MRI of the brain from 05/05/2021 which showed  numerous foci of diffusion abnormality in the cerebrum and cerebellum as well as multiple separate small foci of cerebral cortical enhancement, favored to reflect acute and subacute embolic infarcts.  Short-term follow-up MRI recommended in 6 weeks to exclude metastatic disease.  Bone metastasis in the clivus and cervical spine. - I have recommended him to start taking aspirin 325 mg daily. - He will proceed with his cycle 6 today.  RTC 2 weeks for follow-up.  Plan to repeat CT CAP with contrast and CEA level prior to next visit.  Last CEA has improved to 187 from 288.  Today's white count and platelet count is adequate for treatment.   2.  Chest wall and back pain: - Continue Percocet 10/325 2 to 3 tablets daily.  Pain is well controlled.  3.  Weight loss: - Continue Megace 400 mg twice daily.  He lost 6 pounds in the last 2 weeks. - I have recommended him to drink 3 cans of boost daily.  4.  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  Previous nutritional deficiency work-up was negative. - Hemoglobin today is 9.1.  Ferritin was 1781 and percent saturation 9.  Previous folic acid and X52 were normal.  5.  Neuropathy: - He has cold sensitivity lasting for 2 weeks. - Left hand numbness on and off. - We will consider dose reduction of oxaliplatin if restaging scans show improvement.   Orders placed this encounter:  Orders Placed This Encounter  Procedures   CT CHEST ABDOMEN PELVIS W CONTRAST  Derek Jack, MD [place] [phone]   I, Thana Ates, am acting as a scribe for Dr. Derek Jack.  I, Derek Jack MD, have reviewed the above documentation for accuracy and completeness, and I agree with the above.

## 2021-05-06 ENCOUNTER — Inpatient Hospital Stay (HOSPITAL_BASED_OUTPATIENT_CLINIC_OR_DEPARTMENT_OTHER): Payer: Medicaid Other | Admitting: Hematology

## 2021-05-06 ENCOUNTER — Inpatient Hospital Stay (HOSPITAL_COMMUNITY): Payer: Medicaid Other

## 2021-05-06 ENCOUNTER — Other Ambulatory Visit: Payer: Self-pay

## 2021-05-06 ENCOUNTER — Emergency Department (HOSPITAL_COMMUNITY)
Admission: EM | Admit: 2021-05-06 | Discharge: 2021-05-06 | Disposition: A | Discharge status: Home / Self Care | Payer: Medicaid Other | Attending: Emergency Medicine | Admitting: Emergency Medicine

## 2021-05-06 ENCOUNTER — Encounter (HOSPITAL_COMMUNITY): Payer: Self-pay | Admitting: *Deleted

## 2021-05-06 VITALS — BP 112/60 | HR 88 | Temp 97.5°F | Resp 18 | Ht 67.0 in | Wt 136.0 lb

## 2021-05-06 VITALS — BP 145/67 | HR 72 | Temp 97.3°F | Resp 18

## 2021-05-06 DIAGNOSIS — Z5321 Procedure and treatment not carried out due to patient leaving prior to being seen by health care provider: Secondary | ICD-10-CM | POA: Insufficient documentation

## 2021-05-06 DIAGNOSIS — K769 Liver disease, unspecified: Secondary | ICD-10-CM

## 2021-05-06 DIAGNOSIS — C159 Malignant neoplasm of esophagus, unspecified: Secondary | ICD-10-CM

## 2021-05-06 DIAGNOSIS — Z95828 Presence of other vascular implants and grafts: Secondary | ICD-10-CM

## 2021-05-06 DIAGNOSIS — D649 Anemia, unspecified: Secondary | ICD-10-CM

## 2021-05-06 DIAGNOSIS — C155 Malignant neoplasm of lower third of esophagus: Secondary | ICD-10-CM

## 2021-05-06 DIAGNOSIS — Z5112 Encounter for antineoplastic immunotherapy: Secondary | ICD-10-CM | POA: Diagnosis not present

## 2021-05-06 LAB — CBC WITH DIFFERENTIAL/PLATELET
Band Neutrophils: 2 %
Basophils Absolute: 0 10*3/uL (ref 0.0–0.1)
Basophils Relative: 0 %
Eosinophils Absolute: 0.3 10*3/uL (ref 0.0–0.5)
Eosinophils Relative: 2 %
HCT: 28.4 % — ABNORMAL LOW (ref 39.0–52.0)
Hemoglobin: 9.1 g/dL — ABNORMAL LOW (ref 13.0–17.0)
Lymphocytes Relative: 21 %
Lymphs Abs: 3.1 10*3/uL (ref 0.7–4.0)
MCH: 26.3 pg (ref 26.0–34.0)
MCHC: 32 g/dL (ref 30.0–36.0)
MCV: 82.1 fL (ref 80.0–100.0)
Metamyelocytes Relative: 7 %
Monocytes Absolute: 0.6 10*3/uL (ref 0.1–1.0)
Monocytes Relative: 4 %
Myelocytes: 2 %
Neutro Abs: 9.4 10*3/uL — ABNORMAL HIGH (ref 1.7–7.7)
Neutrophils Relative %: 61 %
Platelets: 102 10*3/uL — ABNORMAL LOW (ref 150–400)
Promyelocytes Relative: 1 %
RBC: 3.46 MIL/uL — ABNORMAL LOW (ref 4.22–5.81)
RDW: 25.2 % — ABNORMAL HIGH (ref 11.5–15.5)
WBC: 14.9 10*3/uL — ABNORMAL HIGH (ref 4.0–10.5)
nRBC: 0 % (ref 0.0–0.2)
nRBC: 1 /100 WBC — ABNORMAL HIGH

## 2021-05-06 LAB — COMPREHENSIVE METABOLIC PANEL
ALT: 25 U/L (ref 0–44)
AST: 28 U/L (ref 15–41)
Albumin: 3 g/dL — ABNORMAL LOW (ref 3.5–5.0)
Alkaline Phosphatase: 173 U/L — ABNORMAL HIGH (ref 38–126)
Anion gap: 9 (ref 5–15)
BUN: 14 mg/dL (ref 8–23)
CO2: 21 mmol/L — ABNORMAL LOW (ref 22–32)
Calcium: 8.7 mg/dL — ABNORMAL LOW (ref 8.9–10.3)
Chloride: 106 mmol/L (ref 98–111)
Creatinine, Ser: 0.92 mg/dL (ref 0.61–1.24)
GFR, Estimated: >60 mL/min (ref >60)
Glucose, Bld: 141 mg/dL — ABNORMAL HIGH (ref 70–99)
Potassium: 3.8 mmol/L (ref 3.5–5.1)
Sodium: 136 mmol/L (ref 135–145)
Total Bilirubin: 0.3 mg/dL (ref 0.3–1.2)
Total Protein: 6.9 g/dL (ref 6.5–8.1)

## 2021-05-06 LAB — FERRITIN: Ferritin: 1781 ng/mL — ABNORMAL HIGH (ref 24–336)

## 2021-05-06 LAB — IRON AND TIBC
Iron: 29 ug/dL — ABNORMAL LOW (ref 45–182)
Saturation Ratios: 9 % — ABNORMAL LOW (ref 17.9–39.5)
TIBC: 334 ug/dL (ref 250–450)
UIBC: 305 ug/dL

## 2021-05-06 LAB — MAGNESIUM: Magnesium: 2 mg/dL (ref 1.7–2.4)

## 2021-05-06 LAB — TSH: TSH: 0.764 u[IU]/mL (ref 0.350–4.500)

## 2021-05-06 MED ORDER — DEXTROSE 5 % IV SOLN: Freq: Once | INTRAVENOUS | Status: AC

## 2021-05-06 MED ORDER — SODIUM CHLORIDE 0.9 % IV SOLN
240.0000 mg | Freq: Once | INTRAVENOUS | Status: AC
  Administered 2021-05-06: 11:00:00 240 mg via INTRAVENOUS
  Filled 2021-05-06: qty 24

## 2021-05-06 MED ORDER — OXALIPLATIN CHEMO INJECTION 100 MG/20ML
82.0000 mg/m2 | Freq: Once | INTRAVENOUS | Status: AC
  Administered 2021-05-06: 12:00:00 150 mg via INTRAVENOUS
  Filled 2021-05-06: qty 10

## 2021-05-06 MED ORDER — SODIUM CHLORIDE 0.9 % IV SOLN
10.0000 mg | Freq: Once | INTRAVENOUS | Status: AC
  Administered 2021-05-06: 10:00:00 10 mg via INTRAVENOUS
  Filled 2021-05-06: qty 10

## 2021-05-06 MED ORDER — FLUOROURACIL CHEMO INJECTION 2.5 GM/50ML
400.0000 mg/m2 | Freq: Once | INTRAVENOUS | Status: AC
  Administered 2021-05-06: 15:00:00 750 mg via INTRAVENOUS
  Filled 2021-05-06: qty 15

## 2021-05-06 MED ORDER — PALONOSETRON HCL INJECTION 0.25 MG/5ML
0.2500 mg | Freq: Once | INTRAVENOUS | Status: AC
  Administered 2021-05-06: 10:00:00 0.25 mg via INTRAVENOUS
  Filled 2021-05-06: qty 5

## 2021-05-06 MED ORDER — SODIUM CHLORIDE 0.9 % IV SOLN
2400.0000 mg/m2 | INTRAVENOUS | Status: DC
  Administered 2021-05-06: 15:00:00 4400 mg via INTRAVENOUS
  Filled 2021-05-06: qty 88

## 2021-05-06 MED ORDER — LEUCOVORIN CALCIUM INJECTION 350 MG
400.0000 mg/m2 | Freq: Once | INTRAVENOUS | Status: AC
  Administered 2021-05-06: 12:00:00 736 mg via INTRAVENOUS
  Filled 2021-05-06: qty 36.8

## 2021-05-06 NOTE — Progress Notes (Signed)
Patient has been examined by Dr. Katragadda, and vital signs and labs have been reviewed. ANC, Creatinine, LFTs, hemoglobin, and platelets are within treatment parameters per M.D. - pt may proceed with treatment.    °

## 2021-05-06 NOTE — ED Notes (Signed)
Pt left due to wait. Gait steady

## 2021-05-06 NOTE — Progress Notes (Signed)
Patient presents today for treatment and follow up visit with Dr. Delton Coombes. Vital signs within parameters for treatment. Labs pending. Patient has no complaints today related to side effects from treatment. Patient denies pain today. Patient states, " I feel pretty good today."   Message received from A Anderson RN/ Dr. Delton Coombes to proceed with treatment. Labs reviewed.   Treatment given today per MD orders. Tolerated infusion without adverse affects. Vital signs stable. No complaints at this time. 5 FU pump infusing and RUN noted on screen. Verified with patient. Discharged from clinic ambulatory in stable condition. Alert and oriented x 3. F/U with [facility] as scheduled.

## 2021-05-06 NOTE — ED Triage Notes (Signed)
Pt's portable chemo stopped working about 30 minutes ago.

## 2021-05-06 NOTE — Patient Instructions (Signed)
Stanley Jimenez at [facility] Discharge Instructions   You were seen and examined today by Dr. Delton Coombes. We will proceed with your treatment today. Dr. Raliegh Ip reviewed the results of your MRI which showed areas of blocked blood flow - please start taking aspirin 325 mg daily. We will repeat a scan prior to your next treatment. Return as scheduled in 2 weeks for treatment and to review your scan results.    Thank you for choosing [facility] to provide your oncology and hematology care.  To afford each patient quality time with our provider, please arrive at least 15 minutes before your scheduled appointment time.   If you have a lab appointment with the [facility] please come in thru the Main Entrance and check in at the main information desk.  You need to re-schedule your appointment should you arrive 10 or more minutes late.  We strive to give you quality time with our providers, and arriving late affects you and other patients whose appointments are after yours.  Also, if you no show three or more times for appointments you may be dismissed from the clinic at the providers discretion.     Again, thank you for choosing [facility].  Our hope is that these requests will decrease the amount of time that you wait before being seen by our physicians.       _____________________________________________________________  Should you have questions after your visit to [facility], please contact our office at [phone] and follow the prompts.  Our office hours are 8:00 a.m. and 4:30 p.m. Monday - Friday.  Please note that voicemails left after 4:00 p.m. may not be returned until the following business day.  We are closed weekends and major holidays.  You do have access to a nurse 24-7, just call the main number to the clinic [phone] and do not press any options, hold on the line and a nurse will answer the phone.     For prescription refill requests, have your pharmacy contact our office and allow 72 hours.    Due to Covid, you will need to wear a mask upon entering the hospital. If you do not have a mask, a mask will be given to you at the Main Entrance upon arrival. For doctor visits, patients may have 1 support person age 63 or older with them. For treatment visits, patients can not have anyone with them due to social distancing guidelines and our immunocompromised population.

## 2021-05-06 NOTE — Patient Instructions (Addendum)
[place]  Discharge Instructions: Thank you for choosing [facility] to provide your oncology and hematology care.  If you have a lab appointment with the [facility], please come in thru the Main Entrance and check in at the main information desk.  Wear comfortable clothing and clothing appropriate for easy access to any Portacath or PICC line.   We strive to give you quality time with your provider. You may need to reschedule your appointment if you arrive late (15 or more minutes).  Arriving late affects you and other patients whose appointments are after yours.  Also, if you miss three or more appointments without notifying the office, you may be dismissed from the clinic at the providers discretion.      For prescription refill requests, have your pharmacy contact our office and allow 72 hours for refills to be completed.    Today you received the following chemotherapy and/or immunotherapy agents Opdivo, Folfox and 5FU pump.       To help prevent nausea and vomiting after your treatment, we encourage you to take your nausea medication as directed.  BELOW ARE SYMPTOMS THAT SHOULD BE REPORTED IMMEDIATELY: *FEVER GREATER THAN 100.4 F (38 C) OR HIGHER *CHILLS OR SWEATING *NAUSEA AND VOMITING THAT IS NOT CONTROLLED WITH YOUR NAUSEA MEDICATION *UNUSUAL SHORTNESS OF BREATH *UNUSUAL BRUISING OR BLEEDING *URINARY PROBLEMS (pain or burning when urinating, or frequent urination) *BOWEL PROBLEMS (unusual diarrhea, constipation, pain near the anus) TENDERNESS IN MOUTH AND THROAT WITH OR WITHOUT PRESENCE OF ULCERS (sore throat, sores in mouth, or a toothache) UNUSUAL RASH, SWELLING OR PAIN  UNUSUAL VAGINAL DISCHARGE OR ITCHING   Items with * indicate a potential emergency and should be followed up as soon as possible or go to the Emergency Department if any problems should occur.  Please show the CHEMOTHERAPY ALERT CARD or IMMUNOTHERAPY ALERT CARD at check-in to  the Emergency Department and triage nurse.  Should you have questions after your visit or need to cancel or reschedule your appointment, please contact [facility] [phone]  and follow the prompts.  Office hours are 8:00 a.m. to 4:30 p.m. Monday - Friday. Please note that voicemails left after 4:00 p.m. may not be returned until the following business day.  We are closed weekends and major holidays. You have access to a nurse at all times for urgent questions. Please call the main number to the clinic [phone] and follow the prompts.  For any non-urgent questions, you may also contact your provider using MyChart. We now offer e-Visits for anyone 63 and older to request care online for non-urgent symptoms. For details visit [email or web address].   Also download the MyChart app! Go to the app store, search "MyChart", open the app, select [facility], and log in with your MyChart username and password.  Due to Covid, a mask is required upon entering the hospital/clinic. If you do not have a mask, one will be given to you upon arrival. For doctor visits, patients may have 1 support person aged 63 or older with them. For treatment visits, patients cannot have anyone with them due to current Covid guidelines and our immunocompromised population. The chemotherapy medication bag should finish at 46 hours, 96 hours, or 7 days. For example, if your pump is scheduled for 46 hours and it was put on at 4:00 p.m., it should finish at 2:00 p.m. the day it is scheduled to come off regardless of your appointment time.  Estimated time to finish at 1300.   If the display on your pump reads "Low Volume" and it is beeping, take the batteries out of the pump and come to the cancer center for it to be taken off.   If the pump alarms go off prior to the pump reading "Low Volume" then call [phone] and someone can assist you.  If the plunger comes out and the chemotherapy medication is leaking  out, please use your home chemo spill kit to clean up the spill. Do NOT use paper towels or other household products.  If you have problems or questions regarding your pump, please call either [phone] (24 hours a day) or the cancer center Monday-Friday 8:00 a.m.- 4:30 p.m. at the clinic number and we will assist you. If you are unable to get assistance, then go to the nearest Emergency Department and ask the staff to contact the IV team for assistance.

## 2021-05-08 ENCOUNTER — Encounter (HOSPITAL_COMMUNITY): Payer: Medicaid Other

## 2021-05-09 ENCOUNTER — Inpatient Hospital Stay (HOSPITAL_COMMUNITY): Payer: Medicaid Other

## 2021-05-09 ENCOUNTER — Other Ambulatory Visit: Payer: Self-pay

## 2021-05-09 VITALS — BP 124/64 | HR 88 | Temp 97.2°F | Resp 17

## 2021-05-09 DIAGNOSIS — C155 Malignant neoplasm of lower third of esophagus: Secondary | ICD-10-CM

## 2021-05-09 DIAGNOSIS — Z5112 Encounter for antineoplastic immunotherapy: Secondary | ICD-10-CM | POA: Diagnosis not present

## 2021-05-09 DIAGNOSIS — Z95828 Presence of other vascular implants and grafts: Secondary | ICD-10-CM

## 2021-05-09 MED ORDER — HEPARIN SOD (PORK) LOCK FLUSH 100 UNIT/ML IV SOLN
500.0000 [IU] | Freq: Once | INTRAVENOUS | Status: AC | PRN
  Administered 2021-05-09: 500 [IU]

## 2021-05-09 MED ORDER — SODIUM CHLORIDE 0.9% FLUSH
10.0000 mL | INTRAVENOUS | Status: DC | PRN
  Administered 2021-05-09: 10 mL

## 2021-05-09 MED ORDER — PEGFILGRASTIM-CBQV 6 MG/0.6ML ~~LOC~~ SOSY
6.0000 mg | PREFILLED_SYRINGE | Freq: Once | SUBCUTANEOUS | Status: AC
  Administered 2021-05-09: 6 mg via SUBCUTANEOUS
  Filled 2021-05-09: qty 0.6

## 2021-05-09 NOTE — Patient Instructions (Signed)
Stanley Jimenez  Discharge Instructions: Thank you for choosing [facility] to provide your oncology and hematology care.  If you have a lab appointment with the [facility], please come in thru the Main Entrance and check in at the main information desk.  Wear comfortable clothing and clothing appropriate for easy access to any Portacath or PICC line.   We strive to give you quality time with your provider. You may need to reschedule your appointment if you arrive late (15 or more minutes).  Arriving late affects you and other patients whose appointments are after yours.  Also, if you miss three or more appointments without notifying the office, you may be dismissed from the clinic at the providers discretion.      For prescription refill requests, have your pharmacy contact our office and allow 72 hours for refills to be completed.    Today you received the following chemotherapy and/or immunotherapy agents Adrucil/5FU pump removal.       To help prevent nausea and vomiting after your treatment, we encourage you to take your nausea medication as directed.  BELOW ARE SYMPTOMS THAT SHOULD BE REPORTED IMMEDIATELY: *FEVER GREATER THAN 100.4 F (38 C) OR HIGHER *CHILLS OR SWEATING *NAUSEA AND VOMITING THAT IS NOT CONTROLLED WITH YOUR NAUSEA MEDICATION *UNUSUAL SHORTNESS OF BREATH *UNUSUAL BRUISING OR BLEEDING *URINARY PROBLEMS (pain or burning when urinating, or frequent urination) *BOWEL PROBLEMS (unusual diarrhea, constipation, pain near the anus) TENDERNESS IN MOUTH AND THROAT WITH OR WITHOUT PRESENCE OF ULCERS (sore throat, sores in mouth, or a toothache) UNUSUAL RASH, SWELLING OR PAIN  UNUSUAL VAGINAL DISCHARGE OR ITCHING   Items with * indicate a potential emergency and should be followed up as soon as possible or go to the Emergency Department if any problems should occur.  Please show the CHEMOTHERAPY ALERT CARD or IMMUNOTHERAPY ALERT CARD at check-in to the  Emergency Department and triage nurse.  Should you have questions after your visit or need to cancel or reschedule your appointment, please contact [facility] [phone]  and follow the prompts.  Office hours are 8:00 a.m. to 4:30 p.m. Monday - Friday. Please note that voicemails left after 4:00 p.m. may not be returned until the following business day.  We are closed weekends and major holidays. You have access to a nurse at all times for urgent questions. Please call the main number to the clinic [phone] and follow the prompts.  For any non-urgent questions, you may also contact your provider using MyChart. We now offer e-Visits for anyone 63 and older to request care online for non-urgent symptoms. For details visit [email or web address].   Also download the MyChart app! Go to the app store, search "MyChart", open the app, select [facility], and log in with your MyChart username and password.  Due to Covid, a mask is required upon entering the hospital/clinic. If you do not have a mask, one will be given to you upon arrival. For doctor visits, patients may have 1 support person aged 63 or older with them. For treatment visits, patients cannot have anyone with them due to current Covid guidelines and our immunocompromised population.

## 2021-05-09 NOTE — Progress Notes (Signed)
Patient presents today for pump d/c and Udenyca injection. Vital signs are stable. Port a cath site clean, dry, and intact. Port flushed with 10 mls of Normal Saline and 500 Units of Heparin. Needle removed intact. Band aid applied.   Stanley Jimenez presents today for injection per the provider's orders.  Udenyca administration without incident; injection site WNL; see MAR for injection details.  Patient tolerated procedure well and without incident.  No questions or complaints noted at this time. Discharged from clinic ambulatory in stable condition. Alert and oriented x 3. F/U with [facility] as scheduled.

## 2021-05-11 ENCOUNTER — Encounter (HOSPITAL_COMMUNITY): Payer: Medicaid Other | Admitting: Dietician

## 2021-05-11 ENCOUNTER — Telehealth (HOSPITAL_COMMUNITY): Payer: Self-pay | Admitting: Dietician

## 2021-05-11 NOTE — Telephone Encounter (Signed)
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   Spoke with patient via telephone. He reports getting "bad stomach aches sometimes" He takes pain medication when needed. This works well for him. Patient reports appetite has gotten a little better, he is taking Megace twice daily. Patient eating 3 small meals and drinking 2-3 Ensure. He denies nausea, vomiting, diarrhea. Patient taking stool softener for constipation. Patient reports he was unaware of CT scan scheduled 12/21, he has not picked up prep yet.   Medications: Megace, Percocet  Labs: 12/14 glucose 141  Anthropometrics: Last weight 141 lb on 12/14 increased from 138 lb 12.8 oz on 12/2.   11/14 - 139 lb 9.6 oz 10/31 - 146 lb 3.2 oz  NUTRITION DIAGNOSIS: Moderate malnutrition continues    INTERVENTION:  Encouraged snacks in between meals and at bedtime - pt has handout with ideas Continue drinking 3 Ensure Plus/equivalent daily  One complimentary case of Ensure Plus left at registration desk for patient to pick up on 12/20  Continue taking appetite stimulant as prescribed per MD Scheduling contacted patient with instructions on where to pick up prep for scheduled 12/21 CT scan  Patient has contact information      MONITORING, EVALUATION, GOAL: weight trends, intake    NEXT VISIT: Thursday January 5

## 2021-05-13 ENCOUNTER — Encounter (HOSPITAL_COMMUNITY): Payer: Self-pay | Admitting: Radiology

## 2021-05-13 ENCOUNTER — Other Ambulatory Visit: Payer: Self-pay

## 2021-05-13 ENCOUNTER — Ambulatory Visit (HOSPITAL_COMMUNITY)
Admission: RE | Admit: 2021-05-13 | Discharge: 2021-05-13 | Disposition: A | Discharge status: Home / Self Care | Payer: Medicaid Other | Source: Ambulatory Visit | Attending: Hematology | Admitting: Hematology

## 2021-05-13 DIAGNOSIS — C159 Malignant neoplasm of esophagus, unspecified: Secondary | ICD-10-CM

## 2021-05-13 MED ORDER — IOHEXOL 300 MG/ML  SOLN
100.0000 mL | Freq: Once | INTRAMUSCULAR | Status: AC | PRN
  Administered 2021-05-13: 09:00:00 100 mL via INTRAVENOUS

## 2021-05-13 MED ORDER — HEPARIN SOD (PORK) LOCK FLUSH 100 UNIT/ML IV SOLN: 500.0000 [IU] | Freq: Once | INTRAVENOUS | Status: AC

## 2021-05-13 MED ORDER — HEPARIN SOD (PORK) LOCK FLUSH 100 UNIT/ML IV SOLN
INTRAVENOUS | Status: AC
  Administered 2021-05-13: 10:00:00 500 [IU] via INTRAVENOUS
  Filled 2021-05-13: qty 5

## 2021-05-25 NOTE — Progress Notes (Signed)
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  Gleason 3+3 = 6 pattern.   08/14/2014 Initial Diagnosis   Malignant neoplasm of prostate [facility])    - 09/20/2014 Radiation Therapy   Brachytherapy with I-125   Malignant tumor of lower third of esophagus ([place])  04/14/2016 Procedure   EGD by Dr. Laural Golden.   04/16/2016 Pathology Results   Esophagus, biopsy - INVASIVE POORLY DIFFERENTIATED SQUAMOUS CELL CARCINOMA.   04/26/2016 Imaging   CT CAP- 1. Prominent gastroesophageal junction without discrete esophageal mass. Notably, there is also a borderline enlarged enhancing lymph node in the gastrohepatic ligament immediately adjacent to the gastroesophageal junction, which is nonspecific, but could indicate early nodal disease. 2. The small cluster of low-attenuation lesions in segment 7 of the liver are too small to characterize. Statistically, these are likely to represent cysts. If there is clinical concern for metastatic disease to the liver, these could be further characterized with MRI of the abdomen with and without IV gadolinium at this time. 3. No other potential sites of metastatic disease noted elsewhere in the chest, abdomen or pelvis.   05/10/2016 PET scan   Hypermetabolic bilateral lower thoracic esophageal mass, consistent with known primary esophageal  carcinoma.  Small hypermetabolic lymph node in the upper gastrohepatic ligament adjacent to GE junction, consistent with lymph node metastasis.  No other sites of metastatic disease identified.   05/31/2016 Procedure   Port placed by Dr. Rosana Hoes   06/02/2016 - 07/14/2016 Chemotherapy   Weekly Carboplatin/Paclitaxel with XRT.  Tolerated extremely well.    06/02/2016 - 07/14/2016 Radiation Therapy   [place], [place].  Tolerated well.   08/23/2016 PET scan   1. Complete metabolic response. No residual hypermetabolism in the thoracic esophagus or in the upper gastrohepatic ligament lymph node, which is decreased in size. 2. Aortic atherosclerosis.  Three-vessel coronary atherosclerosis.   08/24/2016 Miscellaneous   CTS consult with Dr. Servando Snare- "I've recommended to him that surgical resection offers the best chance of cure, if he waits and there is recurrent disease remaining no longer be a surgical candidate. From his discussion today I think he is not inclined to agree but it has been offered."   03/09/2017 Imaging   CT CAP: 1. Mild distal esophageal and GE junction thickening, which appears slightly more prominent compared with PET-CT 08/20/2016. Correlation with PET-CT suggested. 2. Negative for pulmonary nodules or metastatic disease to the chest. 3. Stable subcentimeter posterior right hepatic lobe hypodensities. No definite evidence for metastatic disease to the abdomen or pelvis   04/28/2017 PET scan   No evidence of recurrent or metastatic carcinoma.   02/23/2021 -  Chemotherapy   Patient is on Treatment Plan : GASTROESOPHAGEAL FOLFOX + Opdivo q14d x 6 cycles       CANCER STAGING:  Cancer Staging  Malignant tumor of lower third of esophagus ([place]) Staging form: Esophagus - Squamous Cell Carcinoma, AJCC 8th Edition -  Clinical stage from 06/15/2016: Stage Unknown (cTX, cN1, cM0, L: Lower) - Signed by Baird Cancer, PA-C on 06/15/2016 - Pathologic stage from 02/17/2021: Stage IVB (rpTX,  pN3, pM1, G3) - Unsigned   INTERVAL HISTORY:  Mr. MCGREGOR TINNON, a 66 y.o. male, returns for routine follow-up and consideration for next cycle of chemotherapy. Levester was last seen on 05/06/2021.  Due for cycle #7 of FOLFOX + Opdivo today.   Overall, he tells me he has been feeling pretty well. He reports stable intermittent back, abdominal, and left flank pain which occurs when eating. He has gained 2 lbs. He denies n/v/d. He denies new pains. He reports his tingling/numbness has resolved. He is continuing to take Megace which has improved his appetite.   Overall, he is not ready for his next cycle of chemo today.    REVIEW OF SYSTEMS:  Review of Systems  Constitutional:  Negative for appetite change (75%), fatigue (70%) and unexpected weight change (+2 lbs).  Cardiovascular:  Positive for palpitations.  Gastrointestinal:  Positive for abdominal pain (6/10). Negative for diarrhea, nausea and vomiting.  Musculoskeletal:  Positive for back pain (6/10) and flank pain (L side).  Neurological:  Negative for numbness.  All other systems reviewed and are negative.  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HCC)    Clubbing of fingers    congenital   Depression    Full dentures    History of PSVT (paroxysmal supraventricular tachycardia)    run of non-sustatined VT 03-10-2015 in setting of alcohol withdrawal in hospital   History of seizure    03-08-2012  alcohol withdrawal   History of thrombocytopenia    10/ 2013  in setting of alcohol withdrawal   Hyperlipidemia    Hypertension    Port-A-Cath in place 02/20/2021   Prostate cancer [facility]) urologist-  dr dalhstedt/  oncologist- dr Tammi Klippel   T1c, Gleason 3+3,  PSA 8.89,  vol 27cc   Psoriasis, 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   Seizures ([place])    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    Squamous cell carcinoma of esophagus ([place]) 04/21/2016   Past Surgical History:  Procedure Laterality Date   BIOPSY  04/14/2016   Procedure: BIOPSY;  Surgeon: Rogene Houston, MD;  [facility];  Service: Endoscopy;;  esophagus   COLONOSCOPY N/A 04/14/2016   Procedure: COLONOSCOPY;  Surgeon: Rogene Houston, MD;  Location: [facility];  Service: Endoscopy;  Laterality: N/A;   ESOPHAGOGASTRODUODENOSCOPY N/A 04/14/2016   Procedure: ESOPHAGOGASTRODUODENOSCOPY (EGD);  Surgeon: Rogene Houston, MD;  Location: [facility];  Service: Endoscopy;  Laterality: N/A;  1:00   NO PAST SURGERIES     PORTACATH PLACEMENT Right 05/31/2016   Procedure: INSERTION OF TUNNELED RIGHT INTERNAL JUGULAR BARD POWERPORT CENTRAL VENOUS CATHETER WITH SUBCUTANEOUS PORT;  Surgeon: Vickie Epley, MD;  Location: [facility] OR[facility];  Service: Vascular;  Laterality: Right;   RADIOACTIVE SEED IMPLANT N/A 09/20/2014   Procedure: RADIOACTIVE SEED IMPLANT;  Surgeon: Franchot Gallo, MD;  Location[facility];  Service: Urology;  Laterality: N/A;    77   seeds implanted no seeds found in bladder   TRANSURETHRAL RESECTION OF PROSTATE      SOCIAL HISTORY:  Social History   Socioeconomic History   Marital status: Single    Spouse  name: Not on file   Number of children: Not on file   Years of education: Not on file   Highest education level: Not on file  Occupational History   Not on file  Tobacco Use   Smoking status: Former    Packs/day: 0.50    Years: 20.00    Pack years: 10.00    Types: Cigarettes    Quit date: 11/12/2020    Years since quitting: 0.5   Smokeless tobacco: Former    Types: Chew    Quit date: 05/27/1984   Tobacco comments:    only smoking a few  Vaping Use   Vaping Use: Never used  Substance and Sexual Activity   Alcohol use: Not on file    Comment: quit drinking in October 2015. He says before this he says he stayed drunk every day.    Drug use: No    Comment: last use october  2015   Sexual activity: Never    Birth control/protection: None  Other Topics Concern   Not on file  Social History Narrative   Not on file   Social Determinants of Health   Financial Resource Strain: Not on file  Food Insecurity: Not on file  Transportation Needs: Not on file  Physical Activity: Not on file  Stress: Not on file  Social Connections: Not on file  Intimate Partner Violence: Not on file    FAMILY HISTORY:  Family History  Problem Relation Age of Onset   Alcohol abuse Mother    Diabetes Sister     CURRENT MEDICATIONS:  Current Outpatient Medications  Medication Sig Dispense Refill   amLODipine (NORVASC) 10 MG tablet TAKE 1 TABLET BY MOUTH DAILY 90 tablet 1   fluorouracil CALGB 95188 2,400 mg/m2 in sodium chloride 0.9 % 150 mL Inject 2,400 mg/m2 into the vein over 48 hr.     FLUOROURACIL IV Inject into the vein every 14 (fourteen) days.     LEUCOVORIN CALCIUM IV Inject into the vein every 14 (fourteen) days.     megestrol (MEGACE) 400 MG/10ML suspension Take 10 mLs (400 mg total) by mouth 2 (two) times daily. 480 mL 2   OXALIPLATIN IV Inject into the vein every 14 (fourteen) days.     tamsulosin (FLOMAX) 0.4 MG CAPS capsule TAKE 1 CAPSULE(0.4 MG) BY MOUTH DAILY 90 capsule 1   triamcinolone cream (KENALOG) 0.1 % APPLY TO THE AFFECTED AREA TWICE DAILY 454 g 0   lidocaine-prilocaine (EMLA) cream Apply a small amount to port a cath site and cover with plastic wrap 1 hour prior to chemotherapy appointments (Patient not taking: Reported on 05/26/2021) 30 g 3   oxyCODONE-acetaminophen (PERCOCET) 10-325 MG tablet Take 1 tablet by mouth every 8 (eight) hours as needed for pain. 84 tablet 0   prochlorperazine (COMPAZINE) 10 MG tablet Take 1 tablet (10 mg total) by mouth every 6 (six) hours as needed (Nausea or vomiting). (Patient not taking: Reported on 05/26/2021) 30 tablet 1   No current facility-administered medications for this visit.    ALLERGIES:  No Known  Allergies  PHYSICAL EXAM:  Performance status (ECOG): 1 - Symptomatic but completely ambulatory  Vitals:   05/26/21 0819  BP: 100/60  Pulse: 95  Resp: 18  Temp: 97.6 F (36.4 C)  SpO2: 100%   Wt Readings from Last 3 Encounters:  05/26/21 138 lb 9.6 oz (62.9 kg)  05/06/21 141 lb (64 kg)  05/06/21 136 lb (61.7 kg)   Physical Exam Vitals reviewed.  Constitutional:      Appearance: Normal appearance.  Cardiovascular:     Rate and Rhythm: Normal rate and regular rhythm.     Pulses: Normal pulses.     Heart sounds: Normal heart sounds.  Pulmonary:     Effort: Pulmonary effort is normal.     Breath sounds: Normal breath sounds.  Abdominal:     Palpations: Abdomen is soft.     Tenderness: There is no abdominal tenderness.  Neurological:     General: No focal deficit present.     Mental Status: He is alert and oriented to person, place, and time.  Psychiatric:        Mood and Affect: Mood normal.        Behavior: Behavior normal.    LABORATORY DATA:  I have reviewed the labs as listed.  CBC Latest Ref Rng & Units 05/26/2021 05/06/2021 04/22/2021  WBC 4.0 - 10.5 K/uL 15.1(H) 14.9(H) 3.6(L)  Hemoglobin 13.0 - 17.0 g/dL 8.8(L) 9.1(L) 8.8(L)  Hematocrit 39.0 - 52.0 % 27.3(L) 28.4(L) 27.5(L)  Platelets 150 - 400 K/uL 193 102(L) 148(L)   CMP Latest Ref Rng & Units 05/26/2021 05/06/2021 04/22/2021  Glucose 70 - 99 mg/dL 135(H) 141(H) 115(H)  BUN 8 - 23 mg/dL _0 Creatinine 0.61 - 1.24 mg/dL 0.92 0.92 0.94  Sodium 135 - 145 mmol/L 136 136 138  Potassium 3.5 - 5.1 mmol/L 4.0 3.8 3.9  Chloride 98 - 111 mmol/L 105 106 109  CO2 22 - 32 mmol/L 23 21(L) 21(L)  Calcium 8.9 - 10.3 mg/dL 8.9 8.7(L) 8.9  Total Protein 6.5 - 8.1 g/dL 6.7 6.9 6.6  Total Bilirubin 0.3 - 1.2 mg/dL 0.4 0.3 0.3  Alkaline Phos 38 - 126 U/L 176(H) 173(H) 116  AST 15 - 41 U/L _1 ALT 0 - 44 U/L _2 DIAGNOSTIC IMAGING:  I have independently reviewed the scans and discussed with the  patient. MR Brain W Wo Contrast  Result Date: 05/05/2021 CLINICAL DATA:  Squamous cell carcinoma the esophagus. Evaluation for metastatic disease. Temporary right eye vision loss. EXAM: MRI HEAD WITHOUT AND WITH CONTRAST TECHNIQUE: Multiplanar, multiecho pulse sequences of the brain and surrounding structures were obtained without and with intravenous contrast. CONTRAST:  51m GADAVIST GADOBUTROL 1 MMOL/ML IV SOLN COMPARISON:  Head CT 03/08/2012 FINDINGS: Brain: There are numerous punctate foci of trace diffusion weighted signal hyperintensity scattered throughout both cerebral hemispheres in multiple vascular territories, predominantly cortically based, as well as in the left splenium of the corpus callosum and bilateral cerebellar hemispheres. Their small size limits correlation for reduced ADC. There are also numerous subcentimeter foci of both punctate and gyriform cortical enhancement scattered throughout both cerebral hemispheres separate from the aforementioned foci of diffusion abnormality. There is no significant associated edema. Small T2 hyperintensities scattered throughout the cerebral white matter bilaterally are nonspecific but compatible with mild chronic small vessel ischemic disease. There is a chronic lacunar infarct in the left thalamus with associated chronic blood products. A chronic microhemorrhage is noted in the right thalamus. Small chronic infarcts are noted in both cerebellar hemispheres. There is mild cerebral atrophy. No midline shift or extra-axial fluid collection is present. Vascular: Major intracranial arterial flow voids are preserved. The major dural venous sinuses are enhancing. Skull and upper cervical spine: Enhancing lesions in the clivus and C3 vertebral body. Sinuses/Orbits: Unremarkable orbits. Mild mucosal thickening in the ethmoid sinuses. Trace left mastoid fluid. Other: None. IMPRESSION: 1. Numerous small  foci of diffusion abnormality in the cerebrum and cerebellum  as well as multiple separate small foci of cerebral cortical enhancement. These are favored to reflect acute and subacute embolic infarcts, however a short-term follow-up MRI is recommended in 6 weeks to exclude metastatic disease. 2. Osseous metastases in the clivus and cervical spine. 3. Mild chronic small vessel ischemic disease. 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 Electronically Signed   By: Logan Bores M.D.   On: 05/05/2021 10:18   CT CHEST ABDOMEN PELVIS W CONTRAST  Result Date: 05/13/2021 CLINICAL DATA:  Metastatic squamous cell carcinoma of the esophagus in a 66 year old male. EXAM: CT CHEST, ABDOMEN, AND PELVIS WITH CONTRAST TECHNIQUE: Multidetector CT imaging of the chest, abdomen and pelvis was performed following the standard protocol during bolus administration of intravenous contrast. CONTRAST:  167m OMNIPAQUE IOHEXOL 300 MG/ML  SOLN COMPARISON:  Comparison is made with examination from August of 2022. FINDINGS: CT CHEST FINDINGS Cardiovascular: Port-A-Cath terminates in the mid RIGHT atrium. This enters via RIGHT IJ approach. Calcified and noncalcified atheromatous plaque in the thoracic aorta with out aneurysmal dilation. Normal heart size without substantial pericardial effusion. Normal caliber of central pulmonary vasculature. Mediastinum/Nodes: No thoracic inlet adenopathy, no axillary adenopathy. No hilar adenopathy or mediastinal lymphadenopathy. Thickening of the distal esophagus contiguous with ill-defined soft tissue that tracks along the wall of the stomach. Lungs/Pleura: No consolidation. No pleural effusion. Airways are patent. Mild basilar bronchiectasis. This is seen along the medial lung bases and is associated with septal thickening more likely related to post radiation changes. Musculoskeletal: See below for full musculoskeletal details. CT ABDOMEN PELVIS FINDINGS Hepatobiliary: Signs of metastatic disease to the liver. Lateral segment LEFT hepatic lobe  lesion (image 51/2) 2.8 x 2.8 cm previously approximately 2.4 x 2.3 cm. RIGHT hepatic lobe lesion in hepatic subsegment VII (image 52/2) 13 mm previously 16 mm. (Image 49/2) lesion in hepatic subsegment VIII approximately 12 mm size previously 15 mm. (Image 58/2) 15 mm RIGHT hepatic lobe lesion previously 16 mm. (Image 64/2) hepatic subsegment VI with 17 mm lesion previously 13 mm. Portal vein remains patent slightly attenuated in the area of the peripheral portal vein near the portal splenic confluence due to extrinsic compression from enlarging areas of ill-defined necrotic appearing soft tissue. No biliary duct distension. No pericholecystic stranding. Pancreas: Inseparable from soft tissue about the celiac axis. No signs of inflammation about the pancreas. Spleen: Low-attenuation contacts the medial margin of the spleen along the posterior stomach tracking along gastrosplenic ligament. See below in gastric and gastrointestinal section for further detail. Adrenals/Urinary Tract: Adrenal glands are normal. Symmetric renal enhancement without hydronephrosis. Smooth contour of the urinary bladder. No suspicious renal lesion. Stomach/Bowel: Marked thickening and low attenuation along the posterior wall of the stomach measuring 5.7 x 2.4 cm (image 54/2) low-attenuation from this area tracks into the medial spleen. Hepatic gastric soft tissue inseparable from the diaphragmatic crura, contacting the stomach and the upper portion of the pancreas measures 4.1 x 4.4 cm. This encases the celiac and narrows the portal vein. Rind like soft tissue tracks along the posterior margin of the lesser curvature of the stomach as well. No sign of small bowel obstruction. Ingested contrast media has passed into the colon. Vascular/Lymphatic: Calcified and noncalcified atheromatous plaque of the abdominal aorta. Encased celiac branches in the hepato gastric ligament as described. This soft tissue extends to the porta hepatis contacts  the undersurface of the LEFT hepatic lobe, lesser curvature of the stomach and the adjacent pancreas  narrowing the portal vein as described previously. LEFT retroperitoneal nodule or lymph node (image 60/2) 15 mm. No additional adenopathy below the renal veins. Collateral venous pathways about the stomach likely related to hemodynamic significance of narrowing of the portal vein. Reproductive: No pelvic adenopathy. Brachytherapy seeds are present in the prostate gland. Other: No ascites. Musculoskeletal: Diffuse bony metastatic disease with interval worsening. All visualized bones are involved to some degree. T3 lesion (image 11/2) 13 mm previously 11 mm. 14 mm lesion at T4 previously less than a cm (image 14/2) T10 vertebral lesion (image 41/2) 22 mm as compared to 9 mm. Sternal lesion approximately 17 mm as compared to 8 mm as measured in the coronal plane (image 17/5). Similar enlargement of multiple areas of metastatic disease in the lumbar spine. Violation of anterior posterior cortex due to soft tissue lesion with increased size (image 94/2) LEFT iliac bone 31 mm previously 22 mm Similar enlargement of other bony lesions in the pelvis. LEFT acetabular lesion with cortical breakthrough (image 105/2) 18 mm. Paste that Superior endplate fracture with minimal, approximately 10% loss of height of the superior endplate of T3. Minimal depression of the superior endplate of H47 associated with metastatic lesion enlargement at this location as well. IMPRESSION: Signs of metastatic disease to the liver with enlargement of the dominant lesion in the left hepatic lobe. Decreased size of some hepatic lesions but on balance this can reflect worsening of disease throughout the chest, abdomen and pelvis. Abnormal soft tissue about the stomach and lesser sac encasing vascular structures in the upper abdomen and inseparable from liver, stomach, pancreas and the anterior aorta and diaphragmatic crura. Necrotic appearing areas  along the posterior wall of the stomach as well extending into the spleen. Nodal disease extending into the LEFT retroperitoneum. Mild compression of T3 and T10 but with marked interval worsening of bony metastatic disease throughout the axial and appendicular skeleton as described including RIGHT femoral neck and bilateral acetabular lesions as discussed. Femoral Electronically Signed   By: Zetta Bills M.D.   On: 05/13/2021 20:14     ASSESSMENT:  1.  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 - I have reviewed images of the CT [facility] with contrast from 01/16/2021 which showed mild subjective thickening at the GE junction without defined mass.  There is extensive hepatic metastasis, upper abdominal and retroperitoneal adenopathy and widespread metastatic disease to the bones. - Liver lesion biopsy on 02/06/2021 consistent with high-grade poorly differentiated carcinoma (adeno squamous carcinoma). - Caris testing PD-L1 CPS 40%.  HER2 was negative.  MSI is stable.  TMB was low.  Other targetable mutations were negative.   2.  Social/family history: - He lives at home by himself and is independent of ADLs and IADLs.  He worked in [employer] in the past.  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  His sister lives close by. - He smoked half pack per day for 48 years and quit around 12/10/20. - 2 of his sisters died of cancer.  Patient does not know the type.   PLAN:  1.  Metastatic cancer to the liver, retroperitoneal lymph nodes and bones: - He had completed 6 cycles of FOLFOX and nivolumab. - MRI of the brain on 05/05/2021 showed numerous  foci of diffusion abnormality in the cerebrum and cerebellum as  well as multiple separate small foci of cerebral cortical enhancement, favored to reflect acute and subacute embolic infarcts.  Short-term follow-up MRI recommended in 6 to 8 weeks. - I have reviewed his labs.  CEA has improved to 187 from 296.  CEA level from today is pending. - We reviewed CT CAP from 05/13/2021.  Liver metastatic disease mostly decreased in size except 1 lesion which has increased by few millimeters, does not qualify for progression based on RECIST criteria.  Necrotic appearing areas along the posterior wall of the stomach were seen which was not seen previously.  Abnormal soft tissue about the stomach and lesser sac encasing vascular structures in the upper abdomen was reported.  I will talk to radiologist about clarification. - We will hold his treatment today.  RTC 1 week for follow-up. - I will consider taking CEA from today in conjunction with CT scan prior to changing any therapy.   2.  Chest wall and back pain: - Continue Percocet 10/325 2 to 3 tablets daily.  3.  Weight loss: - Continue Megace 400 mg twice daily. - Continue drinking boost 2 to 3 cans daily.  4.  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  Previous nutritional deficiency work-up was negative with ferritin 1781. - Hemoglobin today is 8.8.  5.  Neuropathy: - Denies any tingling or numbness in the extremities.  No cold sensitivity.   Orders placed this encounter:  No orders of the defined types were placed in this encounter.    Derek Jack, MD Box [phone]   I, Thana Ates, am acting as a scribe for Dr. Derek Jack.  I, Derek Jack MD, have reviewed the above documentation for accuracy and completeness, and I agree with the above.

## 2021-05-26 ENCOUNTER — Inpatient Hospital Stay (HOSPITAL_COMMUNITY): Payer: Medicaid Other

## 2021-05-26 ENCOUNTER — Inpatient Hospital Stay (HOSPITAL_COMMUNITY): Payer: Medicaid Other | Attending: Hematology

## 2021-05-26 ENCOUNTER — Encounter (HOSPITAL_COMMUNITY): Payer: Self-pay | Admitting: Hematology

## 2021-05-26 ENCOUNTER — Other Ambulatory Visit (HOSPITAL_COMMUNITY): Payer: Self-pay

## 2021-05-26 ENCOUNTER — Other Ambulatory Visit: Payer: Self-pay

## 2021-05-26 ENCOUNTER — Inpatient Hospital Stay (HOSPITAL_BASED_OUTPATIENT_CLINIC_OR_DEPARTMENT_OTHER): Payer: Medicaid Other | Admitting: Hematology

## 2021-05-26 VITALS — BP 100/60 | HR 95 | Temp 97.6°F | Resp 18 | Ht 67.0 in | Wt 138.6 lb

## 2021-05-26 DIAGNOSIS — Z79899 Other long term (current) drug therapy: Secondary | ICD-10-CM | POA: Insufficient documentation

## 2021-05-26 DIAGNOSIS — K769 Liver disease, unspecified: Secondary | ICD-10-CM

## 2021-05-26 DIAGNOSIS — C7951 Secondary malignant neoplasm of bone: Secondary | ICD-10-CM | POA: Diagnosis not present

## 2021-05-26 DIAGNOSIS — C772 Secondary and unspecified malignant neoplasm of intra-abdominal lymph nodes: Secondary | ICD-10-CM | POA: Diagnosis not present

## 2021-05-26 DIAGNOSIS — Z5111 Encounter for antineoplastic chemotherapy: Secondary | ICD-10-CM | POA: Diagnosis present

## 2021-05-26 DIAGNOSIS — Z95828 Presence of other vascular implants and grafts: Secondary | ICD-10-CM

## 2021-05-26 DIAGNOSIS — C159 Malignant neoplasm of esophagus, unspecified: Secondary | ICD-10-CM

## 2021-05-26 DIAGNOSIS — Z5189 Encounter for other specified aftercare: Secondary | ICD-10-CM | POA: Diagnosis not present

## 2021-05-26 DIAGNOSIS — Z5112 Encounter for antineoplastic immunotherapy: Secondary | ICD-10-CM | POA: Insufficient documentation

## 2021-05-26 DIAGNOSIS — D649 Anemia, unspecified: Secondary | ICD-10-CM

## 2021-05-26 DIAGNOSIS — C155 Malignant neoplasm of lower third of esophagus: Secondary | ICD-10-CM | POA: Insufficient documentation

## 2021-05-26 DIAGNOSIS — C787 Secondary malignant neoplasm of liver and intrahepatic bile duct: Secondary | ICD-10-CM | POA: Insufficient documentation

## 2021-05-26 LAB — CBC WITH DIFFERENTIAL/PLATELET
Abs Immature Granulocytes: 0.29 10*3/uL — ABNORMAL HIGH (ref 0.00–0.07)
Basophils Absolute: 0.1 10*3/uL (ref 0.0–0.1)
Basophils Relative: 1 %
Eosinophils Absolute: 0.1 10*3/uL (ref 0.0–0.5)
Eosinophils Relative: 1 %
HCT: 27.3 % — ABNORMAL LOW (ref 39.0–52.0)
Hemoglobin: 8.8 g/dL — ABNORMAL LOW (ref 13.0–17.0)
Immature Granulocytes: 2 %
Lymphocytes Relative: 11 %
Lymphs Abs: 1.7 10*3/uL (ref 0.7–4.0)
MCH: 27.6 pg (ref 26.0–34.0)
MCHC: 32.2 g/dL (ref 30.0–36.0)
MCV: 85.6 fL (ref 80.0–100.0)
Monocytes Absolute: 2 10*3/uL — ABNORMAL HIGH (ref 0.1–1.0)
Monocytes Relative: 14 %
Neutro Abs: 10.9 10*3/uL — ABNORMAL HIGH (ref 1.7–7.7)
Neutrophils Relative %: 71 %
Platelets: 193 10*3/uL (ref 150–400)
RBC: 3.19 MIL/uL — ABNORMAL LOW (ref 4.22–5.81)
RDW: 25.9 % — ABNORMAL HIGH (ref 11.5–15.5)
WBC: 15.1 10*3/uL — ABNORMAL HIGH (ref 4.0–10.5)
nRBC: 0 % (ref 0.0–0.2)

## 2021-05-26 LAB — COMPREHENSIVE METABOLIC PANEL
ALT: 29 U/L (ref 0–44)
AST: 24 U/L (ref 15–41)
Albumin: 2.8 g/dL — ABNORMAL LOW (ref 3.5–5.0)
Alkaline Phosphatase: 176 U/L — ABNORMAL HIGH (ref 38–126)
Anion gap: 8 (ref 5–15)
BUN: 17 mg/dL (ref 8–23)
CO2: 23 mmol/L (ref 22–32)
Calcium: 8.9 mg/dL (ref 8.9–10.3)
Chloride: 105 mmol/L (ref 98–111)
Creatinine, Ser: 0.92 mg/dL (ref 0.61–1.24)
GFR, Estimated: >60 mL/min (ref >60)
Glucose, Bld: 135 mg/dL — ABNORMAL HIGH (ref 70–99)
Potassium: 4 mmol/L (ref 3.5–5.1)
Sodium: 136 mmol/L (ref 135–145)
Total Bilirubin: 0.4 mg/dL (ref 0.3–1.2)
Total Protein: 6.7 g/dL (ref 6.5–8.1)

## 2021-05-26 LAB — TSH: TSH: 0.549 u[IU]/mL (ref 0.350–4.500)

## 2021-05-26 MED ORDER — OXYCODONE-ACETAMINOPHEN 10-325 MG PO TABS: 1.0000 | ORAL_TABLET | Freq: Three times a day (TID) | ORAL | 0 refills | Status: DC | PRN

## 2021-05-26 MED ORDER — SODIUM CHLORIDE 0.9% FLUSH
10.0000 mL | INTRAVENOUS | Status: DC | PRN
  Administered 2021-05-26: 10 mL via INTRAVENOUS

## 2021-05-26 MED ORDER — HEPARIN SOD (PORK) LOCK FLUSH 100 UNIT/ML IV SOLN
500.0000 [IU] | Freq: Once | INTRAVENOUS | Status: AC
  Administered 2021-05-26: 500 [IU] via INTRAVENOUS

## 2021-05-26 NOTE — Patient Instructions (Signed)
[place] at [facility] Discharge Instructions   You were seen and examined today by Dr. Delton Coombes. We will hold your treatment today - Dr. Raliegh Ip will talk to the doctor who read your CT scan to get clarification about the spot seen on your stomach. Otherwise, all the cancer spots on your liver have shrunk. We will bring you back in 1 week to recheck blood work and give you your treatment.    Thank you for choosing [place] at [facility] to provide your oncology and hematology care.  To afford each patient quality time with our provider, please arrive at least 15 minutes before your scheduled appointment time.   If you have a lab appointment with the [place] please come in thru the Main Entrance and check in at the main information desk.  You need to re-schedule your appointment should you arrive 10 or more minutes late.  We strive to give you quality time with our providers, and arriving late affects you and other patients whose appointments are after yours.  Also, if you no show three or more times for appointments you may be dismissed from the clinic at the providers discretion.     Again, thank you for choosing [facility].  Our hope is that these requests will decrease the amount of time that you wait before being seen by our physicians.       _____________________________________________________________  Should you have questions after your visit to [facility], please contact our office at [phone] and follow the prompts.  Our office hours are 8:00 a.m. and 4:30 p.m. Monday - Friday.  Please note that voicemails left after 4:00 p.m. may not be returned until the following business day.  We are closed weekends and major holidays.  You do have access to a nurse 24-7, just call the main number to the clinic [phone] and do not press any options, hold on the line and a nurse will answer the phone.    For  prescription refill requests, have your pharmacy contact our office and allow 72 hours.    Due to Covid, you will need to wear a mask upon entering the hospital. If you do not have a mask, a mask will be given to you at the Main Entrance upon arrival. For doctor visits, patients may have 1 support person age 9 or older with them. For treatment visits, patients can not have anyone with them due to social distancing guidelines and our immunocompromised population.

## 2021-05-26 NOTE — Progress Notes (Signed)
No treatment today patient will come back in 1 week for labs and treatment per Dr.K

## 2021-05-27 LAB — CEA: CEA: 235 ng/mL — ABNORMAL HIGH (ref 0.0–4.7)

## 2021-05-28 ENCOUNTER — Encounter (HOSPITAL_COMMUNITY): Payer: Medicaid Other

## 2021-05-28 ENCOUNTER — Ambulatory Visit (HOSPITAL_COMMUNITY): Payer: Medicaid Other | Admitting: Dietician

## 2021-05-28 NOTE — Progress Notes (Signed)
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   Met with patient and sister-in-law in clinic. Patient reports he has a good appetite. He reports having stomach pain after eating. This is significant at times, states it has it curled up in a ball. Patient is taking pain medication as needed. This is helping. Patient reports he as been eating well balanced meals, his sister-in-law has been cooking for him. Patient reports eating a lot of vegetables. He is drinking 2-3 Ensure daily. Patient reports drinking this with his meals. Patient denies nausea, vomiting, constipation. He reports some episodes of diarrhea recently.   Medications: Percocet, Megace, Compazine  Labs: 1/3 - glucose 135  Anthropometrics: Last weight 138 lb 9.6 oz on 1/3  12/14 - 141 lb  12/2 - 138 lb 12.8 oz 11/30 - 142 lb 11/14 - 139 lb 9.6 oz    NUTRITION DIAGNOSIS: Moderate malnutrition continues   INTERVENTION:  Discussed eating small frequent meals and snacks vs larger meals Suggested limiting/avoiding gassy foods - handout provided Continue drinking 2-3 Ensure Plus/equivalent daily, recommend pt drink them in between meals One complimentary case of Ensure Enlive provided today  Educated on care giver support groups and services available at [facility] - will mail January calendar to address provided by sister-in-law Patient has contact information     MONITORING, EVALUATION, GOAL: weight trends, intake   NEXT VISIT: To be scheduled ~4 weeks with treatment plan

## 2021-06-02 ENCOUNTER — Inpatient Hospital Stay (HOSPITAL_COMMUNITY): Payer: Medicaid Other

## 2021-06-02 ENCOUNTER — Other Ambulatory Visit: Payer: Self-pay

## 2021-06-02 ENCOUNTER — Inpatient Hospital Stay (HOSPITAL_BASED_OUTPATIENT_CLINIC_OR_DEPARTMENT_OTHER): Payer: Medicaid Other | Admitting: Hematology

## 2021-06-02 VITALS — BP 118/59 | HR 87 | Temp 97.0°F | Resp 18

## 2021-06-02 VITALS — BP 131/72 | HR 114 | Temp 96.9°F | Resp 18 | Ht 67.0 in | Wt 136.6 lb

## 2021-06-02 DIAGNOSIS — D649 Anemia, unspecified: Secondary | ICD-10-CM | POA: Diagnosis not present

## 2021-06-02 DIAGNOSIS — C155 Malignant neoplasm of lower third of esophagus: Secondary | ICD-10-CM

## 2021-06-02 DIAGNOSIS — C159 Malignant neoplasm of esophagus, unspecified: Secondary | ICD-10-CM | POA: Diagnosis not present

## 2021-06-02 DIAGNOSIS — K769 Liver disease, unspecified: Secondary | ICD-10-CM

## 2021-06-02 DIAGNOSIS — Z95828 Presence of other vascular implants and grafts: Secondary | ICD-10-CM

## 2021-06-02 DIAGNOSIS — Z5112 Encounter for antineoplastic immunotherapy: Secondary | ICD-10-CM | POA: Diagnosis not present

## 2021-06-02 LAB — CBC WITH DIFFERENTIAL/PLATELET
Abs Immature Granulocytes: 0.18 10*3/uL — ABNORMAL HIGH (ref 0.00–0.07)
Basophils Absolute: 0.1 10*3/uL (ref 0.0–0.1)
Basophils Relative: 1 %
Eosinophils Absolute: 0.1 10*3/uL (ref 0.0–0.5)
Eosinophils Relative: 1 %
HCT: 26.8 % — ABNORMAL LOW (ref 39.0–52.0)
Hemoglobin: 8.7 g/dL — ABNORMAL LOW (ref 13.0–17.0)
Immature Granulocytes: 1 %
Lymphocytes Relative: 13 %
Lymphs Abs: 2 10*3/uL (ref 0.7–4.0)
MCH: 27.7 pg (ref 26.0–34.0)
MCHC: 32.5 g/dL (ref 30.0–36.0)
MCV: 85.4 fL (ref 80.0–100.0)
Monocytes Absolute: 2 10*3/uL — ABNORMAL HIGH (ref 0.1–1.0)
Monocytes Relative: 13 %
Neutro Abs: 10.5 10*3/uL — ABNORMAL HIGH (ref 1.7–7.7)
Neutrophils Relative %: 71 %
Platelets: 258 10*3/uL (ref 150–400)
RBC: 3.14 MIL/uL — ABNORMAL LOW (ref 4.22–5.81)
RDW: 24.4 % — ABNORMAL HIGH (ref 11.5–15.5)
WBC: 14.9 10*3/uL — ABNORMAL HIGH (ref 4.0–10.5)
nRBC: 0 % (ref 0.0–0.2)

## 2021-06-02 LAB — COMPREHENSIVE METABOLIC PANEL
ALT: 27 U/L (ref 0–44)
AST: 28 U/L (ref 15–41)
Albumin: 2.8 g/dL — ABNORMAL LOW (ref 3.5–5.0)
Alkaline Phosphatase: 183 U/L — ABNORMAL HIGH (ref 38–126)
Anion gap: 9 (ref 5–15)
BUN: 21 mg/dL (ref 8–23)
CO2: 22 mmol/L (ref 22–32)
Calcium: 9 mg/dL (ref 8.9–10.3)
Chloride: 104 mmol/L (ref 98–111)
Creatinine, Ser: 0.96 mg/dL (ref 0.61–1.24)
GFR, Estimated: >60 mL/min (ref >60)
Glucose, Bld: 124 mg/dL — ABNORMAL HIGH (ref 70–99)
Potassium: 4.1 mmol/L (ref 3.5–5.1)
Sodium: 135 mmol/L (ref 135–145)
Total Bilirubin: 0.4 mg/dL (ref 0.3–1.2)
Total Protein: 6.8 g/dL (ref 6.5–8.1)

## 2021-06-02 LAB — MAGNESIUM: Magnesium: 2.2 mg/dL (ref 1.7–2.4)

## 2021-06-02 MED ORDER — LEUCOVORIN CALCIUM INJECTION 350 MG
400.0000 mg/m2 | Freq: Once | INTRAVENOUS | Status: AC
  Administered 2021-06-02: 736 mg via INTRAVENOUS
  Filled 2021-06-02: qty 36.8

## 2021-06-02 MED ORDER — OXALIPLATIN CHEMO INJECTION 100 MG/20ML
82.0000 mg/m2 | Freq: Once | INTRAVENOUS | Status: AC
  Administered 2021-06-02: 150 mg via INTRAVENOUS
  Filled 2021-06-02: qty 20

## 2021-06-02 MED ORDER — SODIUM CHLORIDE 0.9 % IV SOLN
240.0000 mg | Freq: Once | INTRAVENOUS | Status: AC
  Administered 2021-06-02: 240 mg via INTRAVENOUS
  Filled 2021-06-02: qty 24

## 2021-06-02 MED ORDER — FLUOROURACIL CHEMO INJECTION 2.5 GM/50ML
400.0000 mg/m2 | Freq: Once | INTRAVENOUS | Status: AC
  Administered 2021-06-02: 750 mg via INTRAVENOUS
  Filled 2021-06-02: qty 15

## 2021-06-02 MED ORDER — SODIUM CHLORIDE 0.9 % IV SOLN
10.0000 mg | Freq: Once | INTRAVENOUS | Status: AC
  Administered 2021-06-02: 10 mg via INTRAVENOUS
  Filled 2021-06-02: qty 10

## 2021-06-02 MED ORDER — SODIUM CHLORIDE 0.9 % IV SOLN
2400.0000 mg/m2 | INTRAVENOUS | Status: DC
  Administered 2021-06-02: 4400 mg via INTRAVENOUS
  Filled 2021-06-02: qty 88

## 2021-06-02 MED ORDER — DEXTROSE 5 % IV SOLN: Freq: Once | INTRAVENOUS | Status: AC

## 2021-06-02 MED ORDER — PALONOSETRON HCL INJECTION 0.25 MG/5ML
0.2500 mg | Freq: Once | INTRAVENOUS | Status: AC
  Administered 2021-06-02: 0.25 mg via INTRAVENOUS
  Filled 2021-06-02: qty 5

## 2021-06-02 NOTE — Progress Notes (Signed)
Patient presents today for chemotherapy infusion.  Patient is in satisfactory condition with no new complaints voiced.  Vital signs are stable.  Labs reviewed by Dr. Delton Coombes during his office visit.  All labs are within treatment parameters.  We will proceed with treatment per MD orders.   Patient tolerated treatment well with no complaints voiced.  Patient educated on home infusion 5Fu pump and verbalizes understanding.  Patient left ambulatory in stable condition.  Vital signs stable at discharge.  Follow up as scheduled.

## 2021-06-02 NOTE — Patient Instructions (Signed)
[place] at [facility] Discharge Instructions   You were seen and examined today by Dr. Delton Coombes.  He reviewed you lab work and your most recent CT scan - lab work is stable, and your CT scan doesn't show any progression of your cancer.  We will proceed with your treatment today and every 2 weeks.  We will repeat your CT scan in about 2 months.   Return as scheduled for lab work, office visit, and treatment.    Thank you for choosing [place] at [facility] to provide your oncology and hematology care.  To afford each patient quality time with our provider, please arrive at least 15 minutes before your scheduled appointment time.   If you have a lab appointment with the [place] please come in thru the Main Entrance and check in at the main information desk.  You need to re-schedule your appointment should you arrive 10 or more minutes late.  We strive to give you quality time with our providers, and arriving late affects you and other patients whose appointments are after yours.  Also, if you no show three or more times for appointments you may be dismissed from the clinic at the providers discretion.     Again, thank you for choosing [facility].  Our hope is that these requests will decrease the amount of time that you wait before being seen by our physicians.       _____________________________________________________________  Should you have questions after your visit to [facility], please contact our office at [phone] and follow the prompts.  Our office hours are 8:00 a.m. and 4:30 p.m. Monday - Friday.  Please note that voicemails left after 4:00 p.m. may not be returned until the following business day.  We are closed weekends and major holidays.  You do have access to a nurse 24-7, just call the main number to the clinic [phone] and do not press any options, hold on the line and a nurse  will answer the phone.    For prescription refill requests, have your pharmacy contact our office and allow 72 hours.    Due to Covid, you will need to wear a mask upon entering the hospital. If you do not have a mask, a mask will be given to you at the Main Entrance upon arrival. For doctor visits, patients may have 1 support person age 43 or older with them. For treatment visits, patients can not have anyone with them due to social distancing guidelines and our immunocompromised population.

## 2021-06-02 NOTE — Progress Notes (Signed)
Patient has been examined by Dr. Katragadda, and vital signs and labs have been reviewed. ANC, Creatinine, LFTs, hemoglobin, and platelets are within treatment parameters per M.D. - pt may proceed with treatment.    °

## 2021-06-02 NOTE — Patient Instructions (Signed)
[facility]  Discharge Instructions: Thank you for choosing [facility] to provide your oncology and hematology care.  If you have a lab appointment with the Cancer Center, please come in thru the Main Entrance and check in at the main information desk.  Wear comfortable clothing and clothing appropriate for easy access to any Portacath or PICC line.   We strive to give you quality time with your provider. You may need to reschedule your appointment if you arrive late (15 or more minutes).  Arriving late affects you and other patients whose appointments are after yours.  Also, if you miss three or more appointments without notifying the office, you may be dismissed from the clinic at the provider's discretion.      For prescription refill requests, have your pharmacy contact our office and allow 72 hours for refills to be completed.        To help prevent nausea and vomiting after your treatment, we encourage you to take your nausea medication as directed.  BELOW ARE SYMPTOMS THAT SHOULD BE REPORTED IMMEDIATELY: *FEVER GREATER THAN 100.4 F (38 C) OR HIGHER *CHILLS OR SWEATING *NAUSEA AND VOMITING THAT IS NOT CONTROLLED WITH YOUR NAUSEA MEDICATION *UNUSUAL SHORTNESS OF BREATH *UNUSUAL BRUISING OR BLEEDING *URINARY PROBLEMS (pain or burning when urinating, or frequent urination) *BOWEL PROBLEMS (unusual diarrhea, constipation, pain near the anus) TENDERNESS IN MOUTH AND THROAT WITH OR WITHOUT PRESENCE OF ULCERS (sore throat, sores in mouth, or a toothache) UNUSUAL RASH, SWELLING OR PAIN  UNUSUAL VAGINAL DISCHARGE OR ITCHING   Items with * indicate a potential emergency and should be followed up as soon as possible or go to the Emergency Department if any problems should occur.  Please show the CHEMOTHERAPY ALERT CARD or IMMUNOTHERAPY ALERT CARD at check-in to the Emergency Department and triage nurse.  Should you have questions after your visit or need to cancel  or reschedule your appointment, please contact [facility] [phone]  and follow the prompts.  Office hours are 8:00 a.m. to 4:30 p.m. Monday - Friday. Please note that voicemails left after 4:00 p.m. may not be returned until the following business day.  We are closed weekends and major holidays. You have access to a nurse at all times for urgent questions. Please call the main number to the clinic [phone] and follow the prompts.  For any non-urgent questions, you may also contact your provider using MyChart. We now offer e-Visits for anyone 18 and older to request care online for non-urgent symptoms. For details visit [email or web address].   Also download the MyChart app! Go to the app store, search "MyChart", open the app, select [place], and log in with your MyChart username and password.  Due to Covid, a mask is required upon entering the hospital/clinic. If you do not have a mask, one will be given to you upon arrival. For doctor visits, patients may have 1 support person aged 18 or older with them. For treatment visits, patients cannot have anyone with them due to current Covid guidelines and our immunocompromised population.  

## 2021-06-02 NOTE — Progress Notes (Signed)
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  Gleason 3+3 = 6 pattern.   08/14/2014 Initial Diagnosis   Malignant neoplasm of prostate [facility])    - 09/20/2014 Radiation Therapy   Brachytherapy with I-125   Malignant tumor of lower third of esophagus ([place])  04/14/2016 Procedure   EGD by Dr. Laural Jimenez.   04/16/2016 Pathology Results   Esophagus, biopsy - INVASIVE POORLY DIFFERENTIATED SQUAMOUS CELL CARCINOMA.   04/26/2016 Imaging   CT CAP- 1. Prominent gastroesophageal junction without discrete esophageal mass. Notably, there is also a borderline enlarged enhancing lymph node in the gastrohepatic ligament immediately adjacent to the gastroesophageal junction, which is nonspecific, but could indicate early nodal disease. 2. The small cluster of low-attenuation lesions in segment 7 of the liver are too small to characterize. Statistically, these are likely to represent cysts. If there is clinical concern for metastatic disease to the liver, these could be further characterized with MRI of the abdomen with and without IV gadolinium at this time. 3. No other potential sites of metastatic disease noted elsewhere in the chest, abdomen or pelvis.   05/10/2016 PET scan   Hypermetabolic bilateral lower thoracic esophageal mass, consistent with known primary esophageal  carcinoma.  Small hypermetabolic lymph node in the upper gastrohepatic ligament adjacent to GE junction, consistent with lymph node metastasis.  No other sites of metastatic disease identified.   05/31/2016 Procedure   Port placed by Dr. Rosana Jimenez   06/02/2016 - 07/14/2016 Chemotherapy   Weekly Carboplatin/Paclitaxel with XRT.  Tolerated extremely well.    06/02/2016 - 07/14/2016 Radiation Therapy   Stanley Jimenez, Stanley Jimenez.  Tolerated well.   08/23/2016 PET scan   1. Complete metabolic response. No residual hypermetabolism in the thoracic esophagus or in the upper gastrohepatic ligament lymph node, which is decreased in size. 2. Aortic atherosclerosis.  Three-vessel coronary atherosclerosis.   08/24/2016 Miscellaneous   CTS consult with Dr. Servando Jimenez- "I've recommended to him that surgical resection offers the best chance of cure, if he waits and there is recurrent disease remaining no longer be a surgical candidate. From his discussion today I think he is not inclined to agree but it has been offered."   03/09/2017 Imaging   CT CAP: 1. Mild distal esophageal and GE junction thickening, which appears slightly more prominent compared with PET-CT 08/20/2016. Correlation with PET-CT suggested. 2. Negative for pulmonary nodules or metastatic disease to the chest. 3. Stable subcentimeter posterior right hepatic lobe hypodensities. No definite evidence for metastatic disease to the abdomen or pelvis   04/28/2017 PET scan   No evidence of recurrent or metastatic carcinoma.   02/23/2021 -  Chemotherapy   Patient is on Treatment Plan : GASTROESOPHAGEAL FOLFOX + Opdivo q14d x 6 cycles       Jimenez STAGING:  Jimenez Staging  Malignant tumor of lower third of esophagus ([place]) Staging form: Esophagus - Squamous Cell Carcinoma, AJCC 8th Edition -  Clinical stage from 06/15/2016: Stage Unknown (cTX, cN1, cM0, L: Lower) - Signed by Stanley Cancer, PA-C on 06/15/2016 - Pathologic stage from 02/17/2021: Stage IVB (rpTX,  pN3, pM1, G3) - Unsigned   INTERVAL HISTORY:  [name], a 66 y.o. male, returns for routine follow-up and consideration for next cycle of chemotherapy. Stanley Jimenez was last seen on 05/26/2020.  Due for cycle #7 of FOLFOX + Opdivo today.   Overall, he tells me he has been feeling pretty well. His appetite is good, and he is still taking Megace. He is drinking 2 Ensures daily. He denies tingling/numbness and cold sensitivity. He denies abdominal pain.   Overall, he feels ready for next cycle of chemo today.   REVIEW OF SYSTEMS:  Review of Systems  Constitutional:  Negative for appetite change and fatigue.  Gastrointestinal:  Positive for constipation and diarrhea. Negative for abdominal pain.  Neurological:  Positive for dizziness. Negative for numbness.  All other systems reviewed and are negative.  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HCC)    Clubbing of fingers    congenital   Depression    Full dentures    History of PSVT (paroxysmal supraventricular tachycardia)    run of non-sustatined VT 03-10-2015 in setting of alcohol withdrawal in hospital   History of seizure    03-08-2012  alcohol withdrawal   History of thrombocytopenia    10/ 2013  in setting of alcohol withdrawal   Hyperlipidemia    Hypertension    Port-A-Cath in place 02/20/2021   Prostate Jimenez [facility]) urologist-  dr Stanley Jimenez/  oncologist- dr Stanley Jimenez   T1c, Gleason 3+3,  PSA 8.89,  vol 27cc   Psoriasis, 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   Seizures ([place])    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   Squamous cell carcinoma of esophagus ([place]) 04/21/2016   Past Surgical History:  Procedure Laterality Date   BIOPSY  04/14/2016   Procedure: BIOPSY;  Surgeon: Stanley Houston, MD;  Location[facility];  Service: Endoscopy;;  esophagus   COLONOSCOPY N/A  04/14/2016   Procedure: COLONOSCOPY;  Surgeon: Stanley Houston, MD;  Location: [facility];  Service: Endoscopy;  Laterality: N/A;   ESOPHAGOGASTRODUODENOSCOPY N/A 04/14/2016   Procedure: ESOPHAGOGASTRODUODENOSCOPY (EGD);  Surgeon: Stanley Houston, MD;  Location: [facility];  Service: Endoscopy;  Laterality: N/A;  1:00   NO PAST SURGERIES     PORTACATH PLACEMENT Right 05/31/2016   Procedure: INSERTION OF TUNNELED RIGHT INTERNAL JUGULAR BARD POWERPORT CENTRAL VENOUS CATHETER WITH SUBCUTANEOUS PORT;  Surgeon: Vickie Epley, MD;  Location: [facility];  Service: Vascular;  Laterality: Right;   RADIOACTIVE SEED IMPLANT N/A 09/20/2014   Procedure: RADIOACTIVE SEED IMPLANT;  Surgeon: Franchot Gallo, MD;  Location: [facility];  Service: Urology;  Laterality: N/A;    16   seeds implanted no seeds found in bladder   TRANSURETHRAL RESECTION OF PROSTATE      SOCIAL HISTORY:  Social History   Socioeconomic History   Marital status: Single    Spouse name: Not on file   Number of children: Not on file   Years of education: Not on file   Highest education level: Not on file  Occupational History   Not on file  Tobacco Use   Smoking status: Former  Packs/day: 0.50    Years: 20.00    Pack years: 10.00    Types: Cigarettes    Quit date: 11/12/2020    Years since quitting: 0.5   Smokeless tobacco: Former    Types: Chew    Quit date: 05/27/1984   Tobacco comments:    only smoking a few  Vaping Use   Vaping Use: Never used  Substance and Sexual Activity   Alcohol use: Not on file    Comment: quit drinking in October 2015. He says before this he says he stayed drunk every day.    Drug use: No    Comment: last use october 2015   Sexual activity: Never    Birth control/protection: None  Other Topics Concern   Not on file  Social History Narrative   Not on file   Social Determinants of Health   Financial Resource Strain: Not on file  Food Insecurity: Not on file   Transportation Needs: Not on file  Physical Activity: Not on file  Stress: Not on file  Social Connections: Not on file  Intimate Partner Violence: Not on file    FAMILY HISTORY:  Family History  Problem Relation Age of Onset   Alcohol abuse Mother    Diabetes Sister     CURRENT MEDICATIONS:  Current Outpatient Medications  Medication Sig Dispense Refill   amLODipine (NORVASC) 10 MG tablet TAKE 1 TABLET BY MOUTH DAILY 90 tablet 1   fluorouracil CALGB 30865 2,400 mg/m2 in sodium chloride 0.9 % 150 mL Inject 2,400 mg/m2 into the vein over 48 hr.     FLUOROURACIL IV Inject into the vein every 14 (fourteen) days.     LEUCOVORIN CALCIUM IV Inject into the vein every 14 (fourteen) days.     megestrol (MEGACE) 400 MG/10ML suspension Take 10 mLs (400 mg total) by mouth 2 (two) times daily. 480 mL 2   OXALIPLATIN IV Inject into the vein every 14 (fourteen) days.     oxyCODONE-acetaminophen (PERCOCET) 10-325 MG tablet Take 1 tablet by mouth every 8 (eight) hours as needed for pain. 84 tablet 0   tamsulosin (FLOMAX) 0.4 MG CAPS capsule TAKE 1 CAPSULE(0.4 MG) BY MOUTH DAILY 90 capsule 1   triamcinolone cream (KENALOG) 0.1 % APPLY TO THE AFFECTED AREA TWICE DAILY 454 g 0   lidocaine-prilocaine (EMLA) cream Apply a small amount to port a cath site and cover with plastic wrap 1 hour prior to chemotherapy appointments (Patient not taking: Reported on 06/02/2021) 30 g 3   prochlorperazine (COMPAZINE) 10 MG tablet Take 1 tablet (10 mg total) by mouth every 6 (six) hours as needed (Nausea or vomiting). (Patient not taking: Reported on 06/02/2021) 30 tablet 1   No current facility-administered medications for this visit.    ALLERGIES:  No Known Allergies  PHYSICAL EXAM:  Performance status (ECOG): 1 - Symptomatic but completely ambulatory  Vitals:   06/02/21 0841  BP: 131/72  Pulse: (!) 114  Resp: 18  Temp: (!) 96.9 F (36.1 C)  SpO2: 100%   Wt Readings from Last 3 Encounters:  06/02/21  136 lb 9.6 oz (62 kg)  05/26/21 138 lb 9.6 oz (62.9 kg)  05/06/21 141 lb (64 kg)   Physical Exam Vitals reviewed.  Constitutional:      Appearance: Normal appearance.  Cardiovascular:     Rate and Rhythm: Normal rate and regular rhythm.     Pulses: Normal pulses.     Heart sounds: Normal heart sounds.  Pulmonary:  Effort: Pulmonary effort is normal.     Breath sounds: Normal breath sounds.  Neurological:     General: No focal deficit present.     Mental Status: He is alert and oriented to person, place, and time.  Psychiatric:        Mood and Affect: Mood normal.        Behavior: Behavior normal.    LABORATORY DATA:  I have reviewed the labs as listed.  CBC Latest Ref Rng & Units 06/02/2021 05/26/2021 05/06/2021  WBC 4.0 - 10.5 K/uL 14.9(H) 15.1(H) 14.9(H)  Hemoglobin 13.0 - 17.0 g/dL 8.7(L) 8.8(L) 9.1(L)  Hematocrit 39.0 - 52.0 % 26.8(L) 27.3(L) 28.4(L)  Platelets 150 - 400 K/uL 258 193 102(L)   CMP Latest Ref Rng & Units 05/26/2021 05/06/2021 04/22/2021  Glucose 70 - 99 mg/dL 135(H) 141(H) 115(H)  BUN 8 - 23 mg/dL _0 Creatinine 0.61 - 1.24 mg/dL 0.92 0.92 0.94  Sodium 135 - 145 mmol/L 136 136 138  Potassium 3.5 - 5.1 mmol/L 4.0 3.8 3.9  Chloride 98 - 111 mmol/L 105 106 109  CO2 22 - 32 mmol/L 23 21(L) 21(L)  Calcium 8.9 - 10.3 mg/dL 8.9 8.7(L) 8.9  Total Protein 6.5 - 8.1 g/dL 6.7 6.9 6.6  Total Bilirubin 0.3 - 1.2 mg/dL 0.4 0.3 0.3  Alkaline Phos 38 - 126 U/L 176(H) 173(H) 116  AST 15 - 41 U/L _1 ALT 0 - 44 U/L _2 DIAGNOSTIC IMAGING:  I have independently reviewed the scans and discussed with the patient. MR Brain W Wo Contrast  Result Date: 05/05/2021 CLINICAL DATA:  Squamous cell carcinoma the esophagus. Evaluation for metastatic disease. Temporary right eye vision loss. EXAM: MRI HEAD WITHOUT AND WITH CONTRAST TECHNIQUE: Multiplanar, multiecho pulse sequences of the brain and surrounding structures were obtained without and with  intravenous contrast. CONTRAST:  47m GADAVIST GADOBUTROL 1 MMOL/ML IV SOLN COMPARISON:  Head CT 03/08/2012 FINDINGS: Brain: There are numerous punctate foci of trace diffusion weighted signal hyperintensity scattered throughout both cerebral hemispheres in multiple vascular territories, predominantly cortically based, as well as in the left splenium of the corpus callosum and bilateral cerebellar hemispheres. Their small size limits correlation for reduced ADC. There are also numerous subcentimeter foci of both punctate and gyriform cortical enhancement scattered throughout both cerebral hemispheres separate from the aforementioned foci of diffusion abnormality. There is no significant associated edema. Small T2 hyperintensities scattered throughout the cerebral white matter bilaterally are nonspecific but compatible with mild chronic small vessel ischemic disease. There is a chronic lacunar infarct in the left thalamus with associated chronic blood products. A chronic microhemorrhage is noted in the right thalamus. Small chronic infarcts are noted in both cerebellar hemispheres. There is mild cerebral atrophy. No midline shift or extra-axial fluid collection is present. Vascular: Major intracranial arterial flow voids are preserved. The major dural venous sinuses are enhancing. Skull and upper cervical spine: Enhancing lesions in the clivus and C3 vertebral body. Sinuses/Orbits: Unremarkable orbits. Mild mucosal thickening in the ethmoid sinuses. Trace left mastoid fluid. Other: None. IMPRESSION: 1. Numerous small foci of diffusion abnormality in the cerebrum and cerebellum as well as multiple separate small foci of cerebral cortical enhancement. These are favored to reflect acute and subacute embolic infarcts, however a short-term follow-up MRI is recommended in 6 weeks to exclude metastatic disease. 2. Osseous metastases in the clivus and cervical spine. 3. Mild chronic small vessel ischemic disease. 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 Electronically Signed   By: Logan Bores M.D.   On: 05/05/2021 10:18   CT CHEST ABDOMEN PELVIS W CONTRAST  Result Date: 05/13/2021 CLINICAL DATA:  Metastatic squamous cell carcinoma of the esophagus in a 66 year old male. EXAM: CT CHEST, ABDOMEN, AND PELVIS WITH CONTRAST TECHNIQUE: Multidetector CT imaging of the chest, abdomen and pelvis was performed following the standard protocol during bolus administration of intravenous contrast. CONTRAST:  146m OMNIPAQUE IOHEXOL 300 MG/ML  SOLN COMPARISON:  Comparison is made with examination from August of 2022. FINDINGS: CT CHEST FINDINGS Cardiovascular: Port-A-Cath terminates in the mid RIGHT atrium. This enters via RIGHT IJ approach. Calcified and noncalcified atheromatous plaque in the thoracic aorta with out aneurysmal dilation. Normal heart size without substantial pericardial effusion. Normal caliber of central pulmonary vasculature. Mediastinum/Nodes: No thoracic inlet adenopathy, no axillary adenopathy. No hilar adenopathy or mediastinal lymphadenopathy. Thickening of the distal esophagus contiguous with ill-defined soft tissue that tracks along the wall of the stomach. Lungs/Pleura: No consolidation. No pleural effusion. Airways are patent. Mild basilar bronchiectasis. This is seen along the medial lung bases and is associated with septal thickening more likely related to post radiation changes. Musculoskeletal: See below for full musculoskeletal details. CT ABDOMEN PELVIS FINDINGS Hepatobiliary: Signs of metastatic disease to the liver. Lateral segment LEFT hepatic lobe lesion (image 51/2) 2.8 x 2.8 cm previously approximately 2.4 x 2.3 cm. RIGHT hepatic lobe lesion in hepatic subsegment VII (image 52/2) 13 mm previously 16 mm. (Image 49/2) lesion in hepatic subsegment VIII approximately 12 mm size previously 15 mm. (Image 58/2) 15 mm RIGHT hepatic lobe lesion previously 16 mm. (Image 64/2) hepatic subsegment VI  with 17 mm lesion previously 13 mm. Portal vein remains patent slightly attenuated in the area of the peripheral portal vein near the portal splenic confluence due to extrinsic compression from enlarging areas of ill-defined necrotic appearing soft tissue. No biliary duct distension. No pericholecystic stranding. Pancreas: Inseparable from soft tissue about the celiac axis. No signs of inflammation about the pancreas. Spleen: Low-attenuation contacts the medial margin of the spleen along the posterior stomach tracking along gastrosplenic ligament. See below in gastric and gastrointestinal section for further detail. Adrenals/Urinary Tract: Adrenal glands are normal. Symmetric renal enhancement without hydronephrosis. Smooth contour of the urinary bladder. No suspicious renal lesion. Stomach/Bowel: Marked thickening and low attenuation along the posterior wall of the stomach measuring 5.7 x 2.4 cm (image 54/2) low-attenuation from this area tracks into the medial spleen. Hepatic gastric soft tissue inseparable from the diaphragmatic crura, contacting the stomach and the upper portion of the pancreas measures 4.1 x 4.4 cm. This encases the celiac and narrows the portal vein. Rind like soft tissue tracks along the posterior margin of the lesser curvature of the stomach as well. No sign of small bowel obstruction. Ingested contrast media has passed into the colon. Vascular/Lymphatic: Calcified and noncalcified atheromatous plaque of the abdominal aorta. Encased celiac branches in the hepato gastric ligament as described. This soft tissue extends to the porta hepatis contacts the undersurface of the LEFT hepatic lobe, lesser curvature of the stomach and the adjacent pancreas narrowing the portal vein as described previously. LEFT retroperitoneal nodule or lymph node (image 60/2) 15 mm. No additional adenopathy below the renal veins. Collateral venous pathways about the stomach likely related to hemodynamic significance  of narrowing of the portal vein. Reproductive: No pelvic adenopathy. Brachytherapy seeds are present in the prostate gland. Other: No ascites. Musculoskeletal: Diffuse bony metastatic disease with interval worsening. All  visualized bones are involved to some degree. T3 lesion (image 11/2) 13 mm previously 11 mm. 14 mm lesion at T4 previously less than a cm (image 14/2) T10 vertebral lesion (image 41/2) 22 mm as compared to 9 mm. Sternal lesion approximately 17 mm as compared to 8 mm as measured in the coronal plane (image 17/5). Similar enlargement of multiple areas of metastatic disease in the lumbar spine. Violation of anterior posterior cortex due to soft tissue lesion with increased size (image 94/2) LEFT iliac bone 31 mm previously 22 mm Similar enlargement of other bony lesions in the pelvis. LEFT acetabular lesion with cortical breakthrough (image 105/2) 18 mm. Paste that Superior endplate fracture with minimal, approximately 10% loss of height of the superior endplate of T3. Minimal depression of the superior endplate of W41 associated with metastatic lesion enlargement at this location as well. IMPRESSION: Signs of metastatic disease to the liver with enlargement of the dominant lesion in the left hepatic lobe. Decreased size of some hepatic lesions but on balance this can reflect worsening of disease throughout the chest, abdomen and pelvis. Abnormal soft tissue about the stomach and lesser sac encasing vascular structures in the upper abdomen and inseparable from liver, stomach, pancreas and the anterior aorta and diaphragmatic crura. Necrotic appearing areas along the posterior wall of the stomach as well extending into the spleen. Nodal disease extending into the LEFT retroperitoneum. Mild compression of T3 and T10 but with marked interval worsening of bony metastatic disease throughout the axial and appendicular skeleton as described including RIGHT femoral neck and bilateral acetabular lesions as  discussed. Femoral Electronically Signed   By: Zetta Bills M.D.   On: 05/13/2021 20:14     ASSESSMENT:  1.  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 - I have reviewed images of the CT [facility] with contrast from 01/16/2021 which showed mild subjective thickening at the GE junction without defined mass.  There is extensive hepatic metastasis, upper abdominal and retroperitoneal adenopathy and widespread metastatic disease to the bones. - Liver lesion biopsy on 02/06/2021 consistent with high-grade poorly differentiated carcinoma (adeno squamous carcinoma). - Caris testing PD-L1 CPS 40%.  HER2 was negative.  MSI is stable.  TMB was low.  Other targetable mutations were negative.   2.  Social/family history: - He lives at home by himself and is independent of ADLs and IADLs.  He worked in [employer] in the past.  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  His sister lives close by. - He smoked half pack per day for 48 years and quit around 2020-11-21. - 2 of his sisters died of Jimenez.  Patient does not know the type.   PLAN:  1.  Metastatic Jimenez to the liver, retroperitoneal lymph nodes and bones: - He has completed 6 cycles of FOLFOX and nivolumab. - CT CAP on 05/13/2021 reviewed by me shows liver metastatic lesions mostly decreased in size except 1 lesion which has increased by few millimeters, does not qualify for progression based on RECIST criteria.  Necrotic appearing areas in the posterior wall of the stomach are new, likely treatment changes.  Lymph node mass in the upper abdomen is also stable. - His CEA level has improved from 2 96-1 87 and has slightly  increased to 235 on 05/26/2021. -  Because of equivocal findings, recommend continuing same therapy for 2 months and repeat CT scan.  He is tolerating this regimen very well.  If there is any sudden clinical deterioration, will consider changing therapy sooner than 2 months. - He will proceed with cycle 7 without any dose modifications.  RTC 2 weeks for follow-up.   2.  Chest wall and back pain: - Continue Percocet 10/325 2 to 3 tablets daily.  3.  Weight loss: - Continue Megace 400 mg twice daily. - Continue Ensure to 3 cans/day.  Weight is more or less stable.  4.  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 - Previous nutritional deficiency work-up was negative. - Hemoglobin today is 8.7.  5.  Brain lesions: - MRI of the brain on 05/05/2021 showed numerous foci of diffusion abnormality in the cerebrum and cerebellum as well as multiple separate small foci of cerebral cortical enhancement, favored to reflect acute and subacute embolic infarcts. - We will plan to repeat MRI of the brain in 2 months to rule out metastatic lesions.   Orders placed this encounter:  No orders of the defined types were placed in this encounter.    Derek Jack, MD [place] [phone]   I, Thana Ates, am acting as a scribe for Dr. Derek Jack.  I, Derek Jack MD, have reviewed the above documentation for accuracy and completeness, and I agree with the above.

## 2021-06-04 ENCOUNTER — Other Ambulatory Visit: Payer: Self-pay

## 2021-06-04 ENCOUNTER — Inpatient Hospital Stay (HOSPITAL_COMMUNITY): Payer: Medicaid Other

## 2021-06-04 VITALS — BP 98/65 | HR 102 | Temp 96.9°F | Resp 18

## 2021-06-04 DIAGNOSIS — Z95828 Presence of other vascular implants and grafts: Secondary | ICD-10-CM

## 2021-06-04 DIAGNOSIS — C155 Malignant neoplasm of lower third of esophagus: Secondary | ICD-10-CM

## 2021-06-04 DIAGNOSIS — Z5112 Encounter for antineoplastic immunotherapy: Secondary | ICD-10-CM | POA: Diagnosis not present

## 2021-06-04 MED ORDER — SODIUM CHLORIDE 0.9% FLUSH
10.0000 mL | INTRAVENOUS | Status: DC | PRN
  Administered 2021-06-04: 10 mL

## 2021-06-04 MED ORDER — HEPARIN SOD (PORK) LOCK FLUSH 100 UNIT/ML IV SOLN
500.0000 [IU] | Freq: Once | INTRAVENOUS | Status: AC | PRN
  Administered 2021-06-04: 500 [IU]

## 2021-06-04 MED ORDER — PEGFILGRASTIM-CBQV 6 MG/0.6ML ~~LOC~~ SOSY
6.0000 mg | PREFILLED_SYRINGE | Freq: Once | SUBCUTANEOUS | Status: AC
  Administered 2021-06-04: 6 mg via SUBCUTANEOUS
  Filled 2021-06-04: qty 0.6

## 2021-06-04 NOTE — Patient Instructions (Signed)
[place]  Discharge Instructions: Thank you for choosing [facility] to provide your oncology and hematology care.  If you have a lab appointment with the [facility], please come in thru the Main Entrance and check in at the main information desk.  Wear comfortable clothing and clothing appropriate for easy access to any Portacath or PICC line.   We strive to give you quality time with your provider. You may need to reschedule your appointment if you arrive late (15 or more minutes).  Arriving late affects you and other patients whose appointments are after yours.  Also, if you miss three or more appointments without notifying the office, you may be dismissed from the clinic at the providers discretion.      For prescription refill requests, have your pharmacy contact our office and allow 72 hours for refills to be completed.    Today you received 5FU pump d/c and Udenyca injection.     BELOW ARE SYMPTOMS THAT SHOULD BE REPORTED IMMEDIATELY: *FEVER GREATER THAN 100.4 F (38 C) OR HIGHER *CHILLS OR SWEATING *NAUSEA AND VOMITING THAT IS NOT CONTROLLED WITH YOUR NAUSEA MEDICATION *UNUSUAL SHORTNESS OF BREATH *UNUSUAL BRUISING OR BLEEDING *URINARY PROBLEMS (pain or burning when urinating, or frequent urination) *BOWEL PROBLEMS (unusual diarrhea, constipation, pain near the anus) TENDERNESS IN MOUTH AND THROAT WITH OR WITHOUT PRESENCE OF ULCERS (sore throat, sores in mouth, or a toothache) UNUSUAL RASH, SWELLING OR PAIN  UNUSUAL VAGINAL DISCHARGE OR ITCHING   Items with * indicate a potential emergency and should be followed up as soon as possible or go to the Emergency Department if any problems should occur.  Please show the CHEMOTHERAPY ALERT CARD or IMMUNOTHERAPY ALERT CARD at check-in to the Emergency Department and triage nurse.  Should you have questions after your visit or need to cancel or reschedule your appointment, please contact [facility] [phone]  and follow the prompts.  Office hours are 8:00 a.m. to 4:30 p.m. Monday - Friday. Please note that voicemails left after 4:00 p.m. may not be returned until the following business day.  We are closed weekends and major holidays. You have access to a nurse at all times for urgent questions. Please call the main number to the clinic [phone] and follow the prompts.  For any non-urgent questions, you may also contact your provider using MyChart. We now offer e-Visits for anyone 42 and older to request care online for non-urgent symptoms. For details visit [email or web address].   Also download the MyChart app! Go to the app store, search "MyChart", open the app, select [facility], and log in with your MyChart username and password.  Due to Covid, a mask is required upon entering the hospital/clinic. If you do not have a mask, one will be given to you upon arrival. For doctor visits, patients may have 1 support person aged 83 or older with them. For treatment visits, patients cannot have anyone with them due to current Covid guidelines and our immunocompromised population.

## 2021-06-04 NOTE — Progress Notes (Addendum)
.  Stanley Jimenez presents today for injection per the provider's orders.  Udenyca administration without incident; injection site WNL; see MAR for injection details.  Patient tolerated procedure well and without incident.  No questions or complaints noted at this time. 5FU pump disconnection, port flushed without difficulty with 51ml of NS and 5 ml of heparin. Needle removed intact, no bruising or swelling noted at the site.  5FU pump disconnection and Udenyca injection given today per MD orders. Tolerated infusion without adverse affects. Vital signs stable. No complaints at this time. Discharged from clinic ambulatory in stable condition. Alert and oriented x 3. F/U with [facility] as scheduled.

## 2021-06-16 ENCOUNTER — Encounter (HOSPITAL_COMMUNITY): Payer: Medicaid Other

## 2021-06-16 ENCOUNTER — Inpatient Hospital Stay (HOSPITAL_COMMUNITY): Payer: Medicaid Other

## 2021-06-16 ENCOUNTER — Inpatient Hospital Stay (HOSPITAL_BASED_OUTPATIENT_CLINIC_OR_DEPARTMENT_OTHER): Payer: Medicaid Other | Admitting: Hematology

## 2021-06-16 ENCOUNTER — Other Ambulatory Visit: Payer: Self-pay

## 2021-06-16 DIAGNOSIS — Z5112 Encounter for antineoplastic immunotherapy: Secondary | ICD-10-CM | POA: Diagnosis not present

## 2021-06-16 DIAGNOSIS — D649 Anemia, unspecified: Secondary | ICD-10-CM | POA: Diagnosis not present

## 2021-06-16 DIAGNOSIS — C159 Malignant neoplasm of esophagus, unspecified: Secondary | ICD-10-CM

## 2021-06-16 DIAGNOSIS — C155 Malignant neoplasm of lower third of esophagus: Secondary | ICD-10-CM

## 2021-06-16 DIAGNOSIS — K769 Liver disease, unspecified: Secondary | ICD-10-CM | POA: Diagnosis not present

## 2021-06-16 DIAGNOSIS — Z95828 Presence of other vascular implants and grafts: Secondary | ICD-10-CM

## 2021-06-16 LAB — COMPREHENSIVE METABOLIC PANEL
ALT: 34 U/L (ref 0–44)
AST: 42 U/L — ABNORMAL HIGH (ref 15–41)
Albumin: 3 g/dL — ABNORMAL LOW (ref 3.5–5.0)
Alkaline Phosphatase: 269 U/L — ABNORMAL HIGH (ref 38–126)
Anion gap: 11 (ref 5–15)
BUN: 19 mg/dL (ref 8–23)
CO2: 22 mmol/L (ref 22–32)
Calcium: 9.3 mg/dL (ref 8.9–10.3)
Chloride: 104 mmol/L (ref 98–111)
Creatinine, Ser: 1.12 mg/dL (ref 0.61–1.24)
GFR, Estimated: >60 mL/min (ref >60)
Glucose, Bld: 163 mg/dL — ABNORMAL HIGH (ref 70–99)
Potassium: 4 mmol/L (ref 3.5–5.1)
Sodium: 137 mmol/L (ref 135–145)
Total Bilirubin: 0.4 mg/dL (ref 0.3–1.2)
Total Protein: 6.8 g/dL (ref 6.5–8.1)

## 2021-06-16 LAB — CBC WITH DIFFERENTIAL/PLATELET
Abs Immature Granulocytes: 0.39 10*3/uL — ABNORMAL HIGH (ref 0.00–0.07)
Basophils Absolute: 0.2 10*3/uL — ABNORMAL HIGH (ref 0.0–0.1)
Basophils Relative: 1 %
Eosinophils Absolute: 0.3 10*3/uL (ref 0.0–0.5)
Eosinophils Relative: 1 %
HCT: 28 % — ABNORMAL LOW (ref 39.0–52.0)
Hemoglobin: 9.1 g/dL — ABNORMAL LOW (ref 13.0–17.0)
Immature Granulocytes: 2 %
Lymphocytes Relative: 14 %
Lymphs Abs: 3 10*3/uL (ref 0.7–4.0)
MCH: 27.9 pg (ref 26.0–34.0)
MCHC: 32.5 g/dL (ref 30.0–36.0)
MCV: 85.9 fL (ref 80.0–100.0)
Monocytes Absolute: 1.9 10*3/uL — ABNORMAL HIGH (ref 0.1–1.0)
Monocytes Relative: 9 %
Neutro Abs: 15.4 10*3/uL — ABNORMAL HIGH (ref 1.7–7.7)
Neutrophils Relative %: 73 %
Platelets: 133 10*3/uL — ABNORMAL LOW (ref 150–400)
RBC: 3.26 MIL/uL — ABNORMAL LOW (ref 4.22–5.81)
RDW: 23.9 % — ABNORMAL HIGH (ref 11.5–15.5)
WBC: 21.1 10*3/uL — ABNORMAL HIGH (ref 4.0–10.5)
nRBC: 0 % (ref 0.0–0.2)

## 2021-06-16 LAB — MAGNESIUM: Magnesium: 2.1 mg/dL (ref 1.7–2.4)

## 2021-06-16 LAB — TSH: TSH: 1.012 u[IU]/mL (ref 0.350–4.500)

## 2021-06-16 MED ORDER — SODIUM CHLORIDE 0.9% FLUSH
10.0000 mL | Freq: Once | INTRAVENOUS | Status: AC
  Administered 2021-06-16: 10:00:00 10 mL via INTRAVENOUS

## 2021-06-16 MED ORDER — SODIUM CHLORIDE 0.9 % IV SOLN
Freq: Once | INTRAVENOUS | Status: AC
Start: 2021-06-16 — End: 2021-06-16

## 2021-06-16 MED ORDER — HEPARIN SOD (PORK) LOCK FLUSH 100 UNIT/ML IV SOLN
500.0000 [IU] | Freq: Once | INTRAVENOUS | Status: AC
  Administered 2021-06-16: 10:00:00 500 [IU] via INTRAVENOUS

## 2021-06-16 NOTE — Progress Notes (Signed)
Patient presents today for Opdivo and FOLFOX with 5FU pump start per providers order.  Vital signs within parameters for treatment.    Message received from Anastasio Champion RN/Dr [place], no treatment today, patient to get  one liter normal saline infusion over two hours, MRI of the brain will be obtained prior to next treatment date to rule out METS.  Normal saline infusion given today per MD orders.  Stable during infusion without adverse affects.  Vital signs stable.  No complaints at this time.  Discharge from clinic via wheelchair in stable condition.  Alert and oriented X 3.  Follow up with [facility] as scheduled.

## 2021-06-16 NOTE — Patient Instructions (Signed)
[place] at [facility] Discharge Instructions   You were seen and examined today by Dr. Delton Coombes.  He reviewed your lab work which is normal/stable.  We will hold your treatment today due to your dizziness.  Your blood pressure is also slightly low so we will give you IV fluids to help with that.  You should stop taking your high blood pressure medication (amlodopine/Norvasc) since your blood pressure is low.  We will obtain an MRI of your brain to see what may be causing your dizziness.   Return as scheduled.   Thank you for choosing [place] at [facility] to provide your oncology and hematology care.  To afford each patient quality time with our provider, please arrive at least 15 minutes before your scheduled appointment time.   If you have a lab appointment with the [facility] please come in thru the Main Entrance and check in at the main information desk.  You need to re-schedule your appointment should you arrive 10 or more minutes late.  We strive to give you quality time with our providers, and arriving late affects you and other patients whose appointments are after yours.  Also, if you no show three or more times for appointments you may be dismissed from the clinic at the providers discretion.     Again, thank you for choosing [facility].  Our hope is that these requests will decrease the amount of time that you wait before being seen by our physicians.       _____________________________________________________________  Should you have questions after your visit to [facility], please contact our office at [phone] and follow the prompts.  Our office hours are 8:00 a.m. and 4:30 p.m. Monday - Friday.  Please note that voicemails left after 4:00 p.m. may not be returned until the following business day.  We are closed weekends and major holidays.  You do have access to a nurse 24-7, just call  the main number to the clinic [phone] and do not press any options, hold on the line and a nurse will answer the phone.    For prescription refill requests, have your pharmacy contact our office and allow 72 hours.    Due to Covid, you will need to wear a mask upon entering the hospital. If you do not have a mask, a mask will be given to you at the Main Entrance upon arrival. For doctor visits, patients may have 1 support person age 26 or older with them. For treatment visits, patients can not have anyone with them due to social distancing guidelines and our immunocompromised population.

## 2021-06-16 NOTE — Patient Instructions (Signed)
[place]  Discharge Instructions: Thank you for choosing [facility] to provide your oncology and hematology care.  If you have a lab appointment with the [facility], please come in thru the Main Entrance and check in at the main information desk.  Wear comfortable clothing and clothing appropriate for easy access to any Portacath or PICC line.   We strive to give you quality time with your provider. You may need to reschedule your appointment if you arrive late (15 or more minutes).  Arriving late affects you and other patients whose appointments are after yours.  Also, if you miss three or more appointments without notifying the office, you may be dismissed from the clinic at the providers discretion.      For prescription refill requests, have your pharmacy contact our office and allow 72 hours for refills to be completed.    Today you received the following chemotherapy and/or immunotherapy agents Normal Saline infusion over 2 hours      To help prevent nausea and vomiting after your treatment, we encourage you to take your nausea medication as directed.  BELOW ARE SYMPTOMS THAT SHOULD BE REPORTED IMMEDIATELY: *FEVER GREATER THAN 100.4 F (38 C) OR HIGHER *CHILLS OR SWEATING *NAUSEA AND VOMITING THAT IS NOT CONTROLLED WITH YOUR NAUSEA MEDICATION *UNUSUAL SHORTNESS OF BREATH *UNUSUAL BRUISING OR BLEEDING *URINARY PROBLEMS (pain or burning when urinating, or frequent urination) *BOWEL PROBLEMS (unusual diarrhea, constipation, pain near the anus) TENDERNESS IN MOUTH AND THROAT WITH OR WITHOUT PRESENCE OF ULCERS (sore throat, sores in mouth, or a toothache) UNUSUAL RASH, SWELLING OR PAIN  UNUSUAL VAGINAL DISCHARGE OR ITCHING   Items with * indicate a potential emergency and should be followed up as soon as possible or go to the Emergency Department if any problems should occur.  Please show the CHEMOTHERAPY ALERT CARD or IMMUNOTHERAPY ALERT CARD at  check-in to the Emergency Department and triage nurse.  Should you have questions after your visit or need to cancel or reschedule your appointment, please contact [facility] [phone]  and follow the prompts.  Office hours are 8:00 a.m. to 4:30 p.m. Monday - Friday. Please note that voicemails left after 4:00 p.m. may not be returned until the following business day.  We are closed weekends and major holidays. You have access to a nurse at all times for urgent questions. Please call the main number to the clinic [phone] and follow the prompts.  For any non-urgent questions, you may also contact your provider using MyChart. We now offer e-Visits for anyone 68 and older to request care online for non-urgent symptoms. For details visit [email or web address].   Also download the MyChart app! Go to the app store, search "MyChart", open the app, select [facility], and log in with your MyChart username and password.  Due to Covid, a mask is required upon entering the hospital/clinic. If you do not have a mask, one will be given to you upon arrival. For doctor visits, patients may have 1 support person aged 10 or older with them. For treatment visits, patients cannot have anyone with them due to current Covid guidelines and our immunocompromised population.

## 2021-06-16 NOTE — Progress Notes (Signed)
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  Gleason 3+3 = 6 pattern.   08/14/2014 Initial Diagnosis   Malignant neoplasm of prostate [facility])    - 09/20/2014 Radiation Therapy   Brachytherapy with I-125   Malignant tumor of lower third of esophagus ([place])  04/14/2016 Procedure   EGD by Dr. Laural Jimenez.   04/16/2016 Pathology Results   Esophagus, biopsy - INVASIVE POORLY DIFFERENTIATED SQUAMOUS CELL CARCINOMA.   04/26/2016 Imaging   CT CAP- 1. Prominent gastroesophageal junction without discrete esophageal mass. Notably, there is also a borderline enlarged enhancing lymph node in the gastrohepatic ligament immediately adjacent to the gastroesophageal junction, which is nonspecific, but could indicate early nodal disease. 2. The small cluster of low-attenuation lesions in segment 7 of the liver are too small to characterize. Statistically, these are likely to represent cysts. If there is clinical concern for metastatic disease to the liver, these could be further characterized with MRI of the abdomen with and without IV gadolinium at this time. 3. No other potential sites of metastatic disease noted elsewhere in the chest, abdomen or pelvis.   05/10/2016 PET scan   Hypermetabolic bilateral lower thoracic esophageal mass, consistent with known primary esophageal  carcinoma.  Small hypermetabolic lymph node in the upper gastrohepatic ligament adjacent to GE junction, consistent with lymph node metastasis.  No other sites of metastatic disease identified.   05/31/2016 Procedure   Port placed by Dr. Rosana Jimenez   06/02/2016 - 07/14/2016 Chemotherapy   Weekly Carboplatin/Paclitaxel with XRT.  Tolerated extremely well.    06/02/2016 - 07/14/2016 Radiation Therapy   Stanley Jimenez, Stanley Jimenez.  Tolerated well.   08/23/2016 PET scan   1. Complete metabolic response. No residual hypermetabolism in the thoracic esophagus or in the upper gastrohepatic ligament lymph node, which is decreased in size. 2. Aortic atherosclerosis.  Three-vessel coronary atherosclerosis.   08/24/2016 Miscellaneous   CTS consult with Dr. Servando Jimenez- "I've recommended to him that surgical resection offers the best chance of cure, if he waits and there is recurrent disease remaining no longer be a surgical candidate. From his discussion today I think he is not inclined to agree but it has been offered."   03/09/2017 Imaging   CT CAP: 1. Mild distal esophageal and GE junction thickening, which appears slightly more prominent compared with PET-CT 08/20/2016. Correlation with PET-CT suggested. 2. Negative for pulmonary nodules or metastatic disease to the chest. 3. Stable subcentimeter posterior right hepatic lobe hypodensities. No definite evidence for metastatic disease to the abdomen or pelvis   04/28/2017 PET scan   No evidence of recurrent or metastatic carcinoma.   02/23/2021 -  Chemotherapy   Patient is on Treatment Plan : GASTROESOPHAGEAL FOLFOX + Opdivo q14d x 6 cycles       Jimenez STAGING:  Jimenez Staging  Malignant tumor of lower third of esophagus ([place]) Staging form: Esophagus - Squamous Cell Carcinoma, AJCC 8th Edition -  Clinical stage from 06/15/2016: Stage Unknown (cTX, cN1, cM0, L: Lower) - Signed by Stanley Cancer, PA-C on 06/15/2016 - Pathologic stage from 02/17/2021: Stage IVB (rpTX,  pN3, pM1, G3) - Unsigned   INTERVAL HISTORY:  Stanley Jimenez, a 66 y.o. male, returns for routine follow-up and consideration for next cycle of chemotherapy. Stanley Jimenez was last seen on 06/02/2021.  Due for cycle #8 of FOLFOX + Opdivo today.   Overall, he tells me he has been feeling pretty well. He reports feeling dizzy; he reports he did not sleep well last night as he was tending to a wood stove throughout the night. He has lost 6 lbs in 2 week. He is drinking 2 Ensure daily. He reports occasional epigastric pain for which he takes oxycodone TID. He is taking amlodipine at home.  Overall, he feels ready for next cycle of chemo today.   REVIEW OF SYSTEMS:  Review of Systems  Constitutional:  Positive for unexpected weight change (-6 lbs). Negative for appetite change and fatigue.  Cardiovascular:  Positive for chest pain.  Gastrointestinal:  Positive for abdominal pain.  Neurological:  Positive for dizziness.  All other systems reviewed and are negative.  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[facility])    Clubbing of fingers    congenital   Depression    Full dentures    History of PSVT (paroxysmal supraventricular tachycardia)    run of non-sustatined VT 03-10-2015 in setting of alcohol withdrawal in hospital   History of seizure    03-08-2012  alcohol withdrawal   History of thrombocytopenia    10/ 2013  in setting of alcohol withdrawal   Hyperlipidemia    Hypertension    Port-A-Cath in place 02/20/2021   Prostate Jimenez [facility]) urologist-  dr Stanley Jimenez/  oncologist- dr Stanley Jimenez   T1c, Gleason 3+3,  PSA 8.89,  vol 27cc   Psoriasis, 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   Seizures ([place])    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   Squamous cell carcinoma of esophagus ([place]) 04/21/2016   Past Surgical History:  Procedure Laterality Date    BIOPSY  04/14/2016   Procedure: BIOPSY;  Surgeon: Stanley Houston, MD;  [facility];  Service: Endoscopy;;  esophagus   COLONOSCOPY N/A 04/14/2016   Procedure: COLONOSCOPY;  Surgeon: Stanley Houston, MD;  [facility];  Service: Endoscopy;  Laterality: N/A;   ESOPHAGOGASTRODUODENOSCOPY N/A 04/14/2016   Procedure: ESOPHAGOGASTRODUODENOSCOPY (EGD);  Surgeon: Stanley Houston, MD;  Location[facility];  Service: Endoscopy;  Laterality: N/A;  1:00   NO PAST SURGERIES     PORTACATH PLACEMENT Right 05/31/2016   Procedure: INSERTION OF TUNNELED RIGHT INTERNAL JUGULAR BARD POWERPORT CENTRAL VENOUS CATHETER WITH SUBCUTANEOUS PORT;  Surgeon: Vickie Epley, MD;  Location: [facility] OR[facility];  Service: Vascular;  Laterality: Right;   RADIOACTIVE SEED IMPLANT N/A 09/20/2014   Procedure: RADIOACTIVE SEED IMPLANT;  Surgeon: Franchot Gallo, MD;  Location: [facility];  Service: Urology;  Laterality: N/A;    64   seeds implanted no seeds found in bladder   TRANSURETHRAL RESECTION OF PROSTATE      SOCIAL HISTORY:  Social History   Socioeconomic History   Marital status: Single    Spouse name: Not on file   Number of children: Not on  file   Years of education: Not on file   Highest education level: Not on file  Occupational History   Not on file  Tobacco Use   Smoking status: Former    Packs/day: 0.50    Years: 20.00    Pack years: 10.00    Types: Cigarettes    Quit date: 11/12/2020    Years since quitting: 0.5   Smokeless tobacco: Former    Types: Chew    Quit date: 05/27/1984   Tobacco comments:    only smoking a few  Vaping Use   Vaping Use: Never used  Substance and Sexual Activity   Alcohol use: Not on file    Comment: quit drinking in October 2015. He says before this he says he stayed drunk every day.    Drug use: No    Comment: last use october 2015   Sexual activity: Never    Birth control/protection: None  Other Topics Concern   Not on file   Social History Narrative   Not on file   Social Determinants of Health   Financial Resource Strain: Not on file  Food Insecurity: Not on file  Transportation Needs: Not on file  Physical Activity: Not on file  Stress: Not on file  Social Connections: Not on file  Intimate Partner Violence: Not on file    FAMILY HISTORY:  Family History  Problem Relation Age of Onset   Alcohol abuse Mother    Diabetes Sister     CURRENT MEDICATIONS:  Current Outpatient Medications  Medication Sig Dispense Refill   amLODipine (NORVASC) 10 MG tablet TAKE 1 TABLET BY MOUTH DAILY 90 tablet 1   fluorouracil CALGB 25366 2,400 mg/m2 in sodium chloride 0.9 % 150 mL Inject 2,400 mg/m2 into the vein over 48 hr.     FLUOROURACIL IV Inject into the vein every 14 (fourteen) days.     LEUCOVORIN CALCIUM IV Inject into the vein every 14 (fourteen) days.     megestrol (MEGACE) 400 MG/10ML suspension Take 10 mLs (400 mg total) by mouth 2 (two) times daily. 480 mL 2   OXALIPLATIN IV Inject into the vein every 14 (fourteen) days.     oxyCODONE-acetaminophen (PERCOCET) 10-325 MG tablet Take 1 tablet by mouth every 8 (eight) hours as needed for pain. 84 tablet 0   tamsulosin (FLOMAX) 0.4 MG CAPS capsule TAKE 1 CAPSULE(0.4 MG) BY MOUTH DAILY 90 capsule 1   triamcinolone cream (KENALOG) 0.1 % APPLY TO THE AFFECTED AREA TWICE DAILY 454 g 0   lidocaine-prilocaine (EMLA) cream Apply a small amount to port a cath site and cover with plastic wrap 1 hour prior to chemotherapy appointments (Patient not taking: Reported on 06/16/2021) 30 g 3   prochlorperazine (COMPAZINE) 10 MG tablet Take 1 tablet (10 mg total) by mouth every 6 (six) hours as needed (Nausea or vomiting). (Patient not taking: Reported on 06/16/2021) 30 tablet 1   No current facility-administered medications for this visit.    ALLERGIES:  No Known Allergies  PHYSICAL EXAM:  Performance status (ECOG): 1 - Symptomatic but completely ambulatory  There were  no vitals filed for this visit. Wt Readings from Last 3 Encounters:  06/16/21 130 lb (59 kg)  06/02/21 136 lb 9.6 oz (62 kg)  05/26/21 138 lb 9.6 oz (62.9 kg)   Physical Exam Vitals reviewed.  Constitutional:      Appearance: Normal appearance.  Cardiovascular:     Rate and Rhythm: Normal rate and regular rhythm.  Pulses: Normal pulses.     Heart sounds: Normal heart sounds.  Pulmonary:     Effort: Pulmonary effort is normal.     Breath sounds: Normal breath sounds.  Abdominal:     Palpations: Abdomen is soft. There is no mass.     Tenderness: There is abdominal tenderness in the epigastric area.  Musculoskeletal:     Right lower leg: No edema.     Left lower leg: No edema.  Neurological:     General: No focal deficit present.     Mental Status: He is alert and oriented to person, place, and time.  Psychiatric:        Mood and Affect: Mood normal.        Behavior: Behavior normal.    LABORATORY DATA:  I have reviewed the labs as listed.  CBC Latest Ref Rng & Units 06/16/2021 06/02/2021 05/26/2021  WBC 4.0 - 10.5 K/uL 21.1(H) 14.9(H) 15.1(H)  Hemoglobin 13.0 - 17.0 g/dL 9.1(L) 8.7(L) 8.8(L)  Hematocrit 39.0 - 52.0 % 28.0(L) 26.8(L) 27.3(L)  Platelets 150 - 400 K/uL 133(L) 258 193   CMP Latest Ref Rng & Units 06/16/2021 06/02/2021 05/26/2021  Glucose 70 - 99 mg/dL 163(H) 124(H) 135(H)  BUN 8 - 23 mg/dL _0 Creatinine 0.61 - 1.24 mg/dL 1.12 0.96 0.92  Sodium 135 - 145 mmol/L 137 135 136  Potassium 3.5 - 5.1 mmol/L 4.0 4.1 4.0  Chloride 98 - 111 mmol/L 104 104 105  CO2 22 - 32 mmol/L _1 Calcium 8.9 - 10.3 mg/dL 9.3 9.0 8.9  Total Protein 6.5 - 8.1 g/dL 6.8 6.8 6.7  Total Bilirubin 0.3 - 1.2 mg/dL 0.4 0.4 0.4  Alkaline Phos 38 - 126 U/L 269(H) 183(H) 176(H)  AST 15 - 41 U/L 42(H) 28 24  ALT 0 - 44 U/L 34 27 29    DIAGNOSTIC IMAGING:  I have independently reviewed the scans and discussed with the patient. No results found.   ASSESSMENT:  1.  Squamous cell  carcinoma of the GE junction ([place] cN1 M0): -Status post chemoradiation therapy with weekly carboplatin/paclitaxel from 06/02/2016 through 07/14/2016 - Refused esophagectomy - CT chest lung Jimenez screening scan on 12/18/2020 showed lung RADS 2S, incidental findings of numerous lytic lesions within the visualized portions of bony thorax. - I have reviewed images of the CT [facility] with contrast from 01/16/2021 which showed mild subjective thickening at the GE junction without defined mass.  There is extensive hepatic metastasis, upper abdominal and retroperitoneal adenopathy and widespread metastatic disease to the bones. - Liver lesion biopsy on 02/06/2021 consistent with high-grade poorly differentiated carcinoma (adeno squamous carcinoma). - Caris testing PD-L1 CPS 40%.  HER2 was negative.  MSI is stable.  TMB was low.  Other targetable mutations were negative.   2.  Social/family history: - He lives at home by himself and is independent of ADLs and IADLs.  He worked in [employer] in the past.  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  His sister lives close by. - He smoked half pack per day for 48 years and quit around 12/03/20. - 2 of his sisters died of Jimenez.  Patient does not know the type.   PLAN:  1.  Metastatic Jimenez to the liver, retroperitoneal lymph nodes and bones: - CT CAP on 05/13/2021 shows liver metastatic lesions, mostly decreased in size except 1 lesion which has increased by few millimeters, does not qualify for progression based on RECIST criteria.  Necrotic appearing  areas in the posterior wall of the stomach are new, likely treatment changes.  Lymph node mass in the upper abdomen is also stable. - Last CEA was 235. - Today's labs show elevated alk phos 269 and AST of 42.  Rest of LFTs are normal.  CBC was grossly normal with high white count of 21, likely from G-CSF. - He reports feeling dizzy today.  He also has weight loss.  Hence I have recommended holding treatment.  We will  follow-up on CEA level from today.  If the CEA continues to get worse, will consider changing his treatment to FOLFIRI and ramucirumab.  He will receive 1 L of hydration today.   2.  Chest wall and back pain: -He reported occasional epigastric pain.  He is requiring about 3 tablets of Percocet 10/325/day.  3.  Weight loss: - Continue Megace 400 mg twice daily.  He is drinking Ensure 2 cans/day.  He has decreased eating.  He lost about 6 pounds in the last 2 weeks.  4.  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  Hemoglobin today is 9.1.  No transfusion needed.  5.  Brain lesions: - MRI of the brain on 05/05/2021 showed suspicious lesions. - Today complains of dizziness.  Will order MRI of the brain with and without contrast.   Orders placed this encounter:  Orders Placed This Encounter  Procedures   [place], MD [place] [phone]   I, Thana Ates, am acting as a scribe for Dr. Derek Jack.  I, Derek Jack MD, have reviewed the above documentation for accuracy and completeness, and I agree with the above.

## 2021-06-17 LAB — CEA: CEA: 358 ng/mL — ABNORMAL HIGH (ref 0.0–4.7)

## 2021-06-18 ENCOUNTER — Encounter (HOSPITAL_COMMUNITY): Payer: Medicaid Other

## 2021-06-18 ENCOUNTER — Encounter (HOSPITAL_COMMUNITY): Payer: Self-pay | Admitting: Hematology

## 2021-06-20 ENCOUNTER — Emergency Department (HOSPITAL_COMMUNITY): Payer: Medicaid Other

## 2021-06-20 ENCOUNTER — Encounter (HOSPITAL_COMMUNITY): Payer: Self-pay | Admitting: *Deleted

## 2021-06-20 ENCOUNTER — Other Ambulatory Visit: Payer: Self-pay

## 2021-06-20 ENCOUNTER — Inpatient Hospital Stay (HOSPITAL_COMMUNITY)
Admission: EM | Admit: 2021-06-20 | Discharge: 2021-06-24 | DRG: 065 | Disposition: A | Discharge status: Rehabilitation Facility | Payer: Medicaid Other | Attending: Internal Medicine | Admitting: Internal Medicine

## 2021-06-20 DIAGNOSIS — I63312 Cerebral infarction due to thrombosis of left middle cerebral artery: Secondary | ICD-10-CM | POA: Diagnosis not present

## 2021-06-20 DIAGNOSIS — R4182 Altered mental status, unspecified: Secondary | ICD-10-CM | POA: Diagnosis not present

## 2021-06-20 DIAGNOSIS — I6340 Cerebral infarction due to embolism of unspecified cerebral artery: Secondary | ICD-10-CM | POA: Diagnosis present

## 2021-06-20 DIAGNOSIS — M542 Cervicalgia: Secondary | ICD-10-CM | POA: Diagnosis not present

## 2021-06-20 DIAGNOSIS — I82C11 Acute embolism and thrombosis of right internal jugular vein: Secondary | ICD-10-CM | POA: Diagnosis not present

## 2021-06-20 DIAGNOSIS — I70202 Unspecified atherosclerosis of native arteries of extremities, left leg: Secondary | ICD-10-CM | POA: Diagnosis not present

## 2021-06-20 DIAGNOSIS — R29707 NIHSS score 7: Secondary | ICD-10-CM | POA: Diagnosis present

## 2021-06-20 DIAGNOSIS — F1011 Alcohol abuse, in remission: Secondary | ICD-10-CM | POA: Diagnosis present

## 2021-06-20 DIAGNOSIS — C7889 Secondary malignant neoplasm of other digestive organs: Secondary | ICD-10-CM | POA: Diagnosis not present

## 2021-06-20 DIAGNOSIS — I6503 Occlusion and stenosis of bilateral vertebral arteries: Secondary | ICD-10-CM | POA: Diagnosis not present

## 2021-06-20 DIAGNOSIS — M8458XA Pathological fracture in neoplastic disease, other specified site, initial encounter for fracture: Secondary | ICD-10-CM | POA: Diagnosis present

## 2021-06-20 DIAGNOSIS — I10 Essential (primary) hypertension: Secondary | ICD-10-CM | POA: Diagnosis present

## 2021-06-20 DIAGNOSIS — C61 Malignant neoplasm of prostate: Secondary | ICD-10-CM | POA: Diagnosis not present

## 2021-06-20 DIAGNOSIS — E785 Hyperlipidemia, unspecified: Secondary | ICD-10-CM | POA: Diagnosis present

## 2021-06-20 DIAGNOSIS — Z87891 Personal history of nicotine dependence: Secondary | ICD-10-CM

## 2021-06-20 DIAGNOSIS — Z7189 Other specified counseling: Secondary | ICD-10-CM | POA: Diagnosis not present

## 2021-06-20 DIAGNOSIS — R4701 Aphasia: Secondary | ICD-10-CM | POA: Diagnosis present

## 2021-06-20 DIAGNOSIS — Z8546 Personal history of malignant neoplasm of prostate: Secondary | ICD-10-CM | POA: Diagnosis not present

## 2021-06-20 DIAGNOSIS — I63419 Cerebral infarction due to embolism of unspecified middle cerebral artery: Secondary | ICD-10-CM | POA: Diagnosis not present

## 2021-06-20 DIAGNOSIS — C159 Malignant neoplasm of esophagus, unspecified: Secondary | ICD-10-CM | POA: Diagnosis not present

## 2021-06-20 DIAGNOSIS — K7030 Alcoholic cirrhosis of liver without ascites: Secondary | ICD-10-CM | POA: Diagnosis present

## 2021-06-20 DIAGNOSIS — I739 Peripheral vascular disease, unspecified: Secondary | ICD-10-CM | POA: Diagnosis not present

## 2021-06-20 DIAGNOSIS — Z95828 Presence of other vascular implants and grafts: Secondary | ICD-10-CM | POA: Diagnosis not present

## 2021-06-20 DIAGNOSIS — C155 Malignant neoplasm of lower third of esophagus: Secondary | ICD-10-CM | POA: Diagnosis not present

## 2021-06-20 DIAGNOSIS — D72829 Elevated white blood cell count, unspecified: Secondary | ICD-10-CM | POA: Diagnosis not present

## 2021-06-20 DIAGNOSIS — I6521 Occlusion and stenosis of right carotid artery: Secondary | ICD-10-CM | POA: Diagnosis not present

## 2021-06-20 DIAGNOSIS — R188 Other ascites: Secondary | ICD-10-CM | POA: Diagnosis not present

## 2021-06-20 DIAGNOSIS — R1030 Lower abdominal pain, unspecified: Secondary | ICD-10-CM | POA: Diagnosis not present

## 2021-06-20 DIAGNOSIS — I639 Cerebral infarction, unspecified: Secondary | ICD-10-CM | POA: Diagnosis present

## 2021-06-20 DIAGNOSIS — I6523 Occlusion and stenosis of bilateral carotid arteries: Secondary | ICD-10-CM | POA: Diagnosis present

## 2021-06-20 DIAGNOSIS — Z515 Encounter for palliative care: Secondary | ICD-10-CM | POA: Diagnosis not present

## 2021-06-20 DIAGNOSIS — C7951 Secondary malignant neoplasm of bone: Secondary | ICD-10-CM | POA: Diagnosis present

## 2021-06-20 DIAGNOSIS — Z79899 Other long term (current) drug therapy: Secondary | ICD-10-CM | POA: Diagnosis not present

## 2021-06-20 DIAGNOSIS — N400 Enlarged prostate without lower urinary tract symptoms: Secondary | ICD-10-CM | POA: Diagnosis present

## 2021-06-20 DIAGNOSIS — Z20822 Contact with and (suspected) exposure to covid-19: Secondary | ICD-10-CM | POA: Diagnosis present

## 2021-06-20 DIAGNOSIS — R319 Hematuria, unspecified: Secondary | ICD-10-CM

## 2021-06-20 DIAGNOSIS — I6389 Other cerebral infarction: Secondary | ICD-10-CM | POA: Diagnosis not present

## 2021-06-20 DIAGNOSIS — R29818 Other symptoms and signs involving the nervous system: Secondary | ICD-10-CM | POA: Diagnosis not present

## 2021-06-20 DIAGNOSIS — C801 Malignant (primary) neoplasm, unspecified: Secondary | ICD-10-CM | POA: Diagnosis not present

## 2021-06-20 DIAGNOSIS — R9431 Abnormal electrocardiogram [ECG] [EKG]: Secondary | ICD-10-CM | POA: Diagnosis not present

## 2021-06-20 DIAGNOSIS — F1021 Alcohol dependence, in remission: Secondary | ICD-10-CM | POA: Diagnosis not present

## 2021-06-20 DIAGNOSIS — I6310 Cerebral infarction due to embolism of unspecified precerebral artery: Secondary | ICD-10-CM | POA: Diagnosis not present

## 2021-06-20 DIAGNOSIS — I651 Occlusion and stenosis of basilar artery: Secondary | ICD-10-CM | POA: Diagnosis not present

## 2021-06-20 DIAGNOSIS — I69320 Aphasia following cerebral infarction: Secondary | ICD-10-CM | POA: Diagnosis not present

## 2021-06-20 DIAGNOSIS — I63132 Cerebral infarction due to embolism of left carotid artery: Secondary | ICD-10-CM | POA: Diagnosis not present

## 2021-06-20 LAB — DIFFERENTIAL
Abs Immature Granulocytes: 0.26 10*3/uL — ABNORMAL HIGH (ref 0.00–0.07)
Basophils Absolute: 0.1 10*3/uL (ref 0.0–0.1)
Basophils Relative: 1 %
Eosinophils Absolute: 0.1 10*3/uL (ref 0.0–0.5)
Eosinophils Relative: 0 %
Immature Granulocytes: 1 %
Lymphocytes Relative: 7 %
Lymphs Abs: 1.6 10*3/uL (ref 0.7–4.0)
Monocytes Absolute: 1.9 10*3/uL — ABNORMAL HIGH (ref 0.1–1.0)
Monocytes Relative: 8 %
Neutro Abs: 20.1 10*3/uL — ABNORMAL HIGH (ref 1.7–7.7)
Neutrophils Relative %: 83 %

## 2021-06-20 LAB — COMPREHENSIVE METABOLIC PANEL
ALT: 35 U/L (ref 0–44)
AST: 43 U/L — ABNORMAL HIGH (ref 15–41)
Albumin: 2.9 g/dL — ABNORMAL LOW (ref 3.5–5.0)
Alkaline Phosphatase: 222 U/L — ABNORMAL HIGH (ref 38–126)
Anion gap: 6 (ref 5–15)
BUN: 17 mg/dL (ref 8–23)
CO2: 24 mmol/L (ref 22–32)
Calcium: 9.7 mg/dL (ref 8.9–10.3)
Chloride: 104 mmol/L (ref 98–111)
Creatinine, Ser: 0.98 mg/dL (ref 0.61–1.24)
GFR, Estimated: >60 mL/min (ref >60)
Glucose, Bld: 106 mg/dL — ABNORMAL HIGH (ref 70–99)
Potassium: 3.8 mmol/L (ref 3.5–5.1)
Sodium: 134 mmol/L — ABNORMAL LOW (ref 135–145)
Total Bilirubin: 0.3 mg/dL (ref 0.3–1.2)
Total Protein: 6.7 g/dL (ref 6.5–8.1)

## 2021-06-20 LAB — RAPID URINE DRUG SCREEN, HOSP PERFORMED
Amphetamines: NOT DETECTED
Barbiturates: NOT DETECTED
Benzodiazepines: NOT DETECTED
Cocaine: NOT DETECTED
Opiates: POSITIVE — AB
Tetrahydrocannabinol: NOT DETECTED

## 2021-06-20 LAB — URINALYSIS, ROUTINE W REFLEX MICROSCOPIC
Bilirubin Urine: NEGATIVE
Glucose, UA: NEGATIVE mg/dL
Ketones, ur: NEGATIVE mg/dL
Leukocytes,Ua: NEGATIVE
Nitrite: NEGATIVE
Specific Gravity, Urine: 1.02 (ref 1.005–1.030)
pH: 6 (ref 5.0–8.0)

## 2021-06-20 LAB — CBC
HCT: 28 % — ABNORMAL LOW (ref 39.0–52.0)
Hemoglobin: 8.8 g/dL — ABNORMAL LOW (ref 13.0–17.0)
MCH: 27.4 pg (ref 26.0–34.0)
MCHC: 31.4 g/dL (ref 30.0–36.0)
MCV: 87.2 fL (ref 80.0–100.0)
Platelets: 96 10*3/uL — ABNORMAL LOW (ref 150–400)
RBC: 3.21 MIL/uL — ABNORMAL LOW (ref 4.22–5.81)
RDW: 23.4 % — ABNORMAL HIGH (ref 11.5–15.5)
WBC: 24 10*3/uL — ABNORMAL HIGH (ref 4.0–10.5)
nRBC: 0 % (ref 0.0–0.2)

## 2021-06-20 LAB — RESP PANEL BY RT-PCR (FLU A&B, COVID) ARPGX2
Influenza A by PCR: NEGATIVE
Influenza B by PCR: NEGATIVE
SARS Coronavirus 2 by RT PCR: NEGATIVE

## 2021-06-20 LAB — AMMONIA: Ammonia: 24 umol/L (ref 9–35)

## 2021-06-20 LAB — BLOOD GAS, VENOUS
Acid-base deficit: 0.8 mmol/L (ref 0.0–2.0)
Bicarbonate: 22 mmol/L (ref 20.0–28.0)
Drawn by: 27160
FIO2: 21
O2 Saturation: 21.5 %
Patient temperature: 36.5
pCO2, Ven: 41.6 mmHg — ABNORMAL LOW (ref 44.0–60.0)
pH, Ven: 7.373 (ref 7.250–7.430)
pO2, Ven: <31 mmHg — CL (ref 32.0–45.0)

## 2021-06-20 LAB — PROTIME-INR
INR: 1.3 — ABNORMAL HIGH (ref 0.8–1.2)
Prothrombin Time: 16.1 seconds — ABNORMAL HIGH (ref 11.4–15.2)

## 2021-06-20 LAB — URINALYSIS, MICROSCOPIC (REFLEX)

## 2021-06-20 LAB — I-STAT CHEM 8, ED
BUN: 22 mg/dL (ref 8–23)
Calcium, Ion: 1.29 mmol/L (ref 1.15–1.40)
Chloride: 104 mmol/L (ref 98–111)
Creatinine, Ser: 0.8 mg/dL (ref 0.61–1.24)
Glucose, Bld: 107 mg/dL — ABNORMAL HIGH (ref 70–99)
HCT: 36 % — ABNORMAL LOW (ref 39.0–52.0)
Hemoglobin: 12.2 g/dL — ABNORMAL LOW (ref 13.0–17.0)
Potassium: 4.7 mmol/L (ref 3.5–5.1)
Sodium: 134 mmol/L — ABNORMAL LOW (ref 135–145)
TCO2: 23 mmol/L (ref 22–32)

## 2021-06-20 LAB — APTT: aPTT: 35 seconds (ref 24–36)

## 2021-06-20 LAB — ETHANOL: Alcohol, Ethyl (B): <10 mg/dL (ref <10)

## 2021-06-20 MED ORDER — HEPARIN (PORCINE) 25000 UT/250ML-% IV SOLN
1200.0000 [IU]/h | INTRAVENOUS | Status: DC
  Administered 2021-06-20: 700 [IU]/h via INTRAVENOUS
  Administered 2021-06-22: 1000 [IU]/h via INTRAVENOUS
  Administered 2021-06-23: 1200 [IU]/h via INTRAVENOUS
  Filled 2021-06-20 (×3): qty 250

## 2021-06-20 MED ORDER — IOHEXOL 350 MG/ML SOLN
100.0000 mL | Freq: Once | INTRAVENOUS | Status: AC | PRN
  Administered 2021-06-20: 100 mL via INTRAVENOUS

## 2021-06-20 MED ORDER — ACETAMINOPHEN 160 MG/5ML PO SOLN: 650.0000 mg | ORAL | Status: DC | PRN

## 2021-06-20 MED ORDER — ACETAMINOPHEN 325 MG PO TABS: 650.0000 mg | ORAL_TABLET | ORAL | Status: DC | PRN

## 2021-06-20 MED ORDER — ASPIRIN 300 MG RE SUPP
300.0000 mg | Freq: Every day | RECTAL | Status: DC
  Administered 2021-06-21: 300 mg via RECTAL
  Filled 2021-06-20: qty 1

## 2021-06-20 MED ORDER — LORAZEPAM 2 MG/ML IJ SOLN
1.0000 mg | Freq: Once | INTRAMUSCULAR | Status: AC
  Administered 2021-06-20: 1 mg via INTRAVENOUS
  Filled 2021-06-20: qty 1

## 2021-06-20 MED ORDER — SENNOSIDES-DOCUSATE SODIUM 8.6-50 MG PO TABS: 1.0000 | ORAL_TABLET | Freq: Every evening | ORAL | Status: DC | PRN

## 2021-06-20 MED ORDER — SODIUM CHLORIDE 0.9 % IV SOLN
100.0000 mL/h | INTRAVENOUS | Status: DC
  Administered 2021-06-20 (×2): 100 mL/h via INTRAVENOUS

## 2021-06-20 MED ORDER — ACETAMINOPHEN 650 MG RE SUPP
650.0000 mg | RECTAL | Status: DC | PRN
  Filled 2021-06-20: qty 1

## 2021-06-20 MED ORDER — SODIUM CHLORIDE 0.9 % IV BOLUS
500.0000 mL | Freq: Once | INTRAVENOUS | Status: AC
  Administered 2021-06-20: 500 mL via INTRAVENOUS

## 2021-06-20 MED ORDER — SODIUM CHLORIDE 0.9 % IV SOLN: INTRAVENOUS | Status: DC

## 2021-06-20 MED ORDER — HYDROMORPHONE HCL 1 MG/ML IJ SOLN
1.0000 mg | INTRAMUSCULAR | Status: DC | PRN
  Administered 2021-06-20 – 2021-06-22 (×4): 1 mg via INTRAVENOUS
  Filled 2021-06-20 (×4): qty 1

## 2021-06-20 MED ORDER — ASPIRIN 325 MG PO TABS
325.0000 mg | ORAL_TABLET | Freq: Every day | ORAL | Status: DC
  Administered 2021-06-22 – 2021-06-23 (×2): 325 mg via ORAL
  Filled 2021-06-20 (×3): qty 1

## 2021-06-20 MED ORDER — NALOXONE HCL 0.4 MG/ML IJ SOLN
0.4000 mg | Freq: Once | INTRAMUSCULAR | Status: AC
  Administered 2021-06-20: 0.4 mg via INTRAVENOUS
  Filled 2021-06-20: qty 1

## 2021-06-20 MED ORDER — STROKE: EARLY STAGES OF RECOVERY BOOK: Freq: Once | Status: AC

## 2021-06-20 NOTE — Progress Notes (Signed)
ANTICOAGULATION CONSULT NOTE - Initial Consult  Pharmacy Consult for heparin gtt  Indication: stroke  No Known Allergies  Patient Measurements: Height: 5' 8.5" (174 cm) Weight: 59 kg (130 lb) IBW/kg (Calculated) : 69.55 Heparin Dosing Weight: HEPARIN DW (KG): 59   Vital Signs: Temp: 99.2 F (37.3 C) (01/28 1619) Temp Source: Oral (01/28 1619) BP: 135/70 (01/28 1730) Pulse Rate: 91 (01/28 1730)  Labs: Recent Labs    06/20/21 1026 06/20/21 1028  HGB 12.2* 8.8*  HCT 36.0* 28.0*  PLT  --  96*  APTT  --  35  LABPROT  --  16.1*  INR  --  1.3*  CREATININE 0.80 0.98    Estimated Creatinine Clearance: 64.4 mL/min (by C-G formula based on SCr of 0.98 mg/dL).   Medical History: Past Medical History:  Diagnosis Date   Alcohol abuse, in remission    SINCE 10- 2015   Anxiety    Arthritis    Cirrhosis, alcoholic ([place])    Clubbing of fingers    congenital   Depression    Full dentures    History of PSVT (paroxysmal supraventricular tachycardia)    run of non-sustatined VT 03-10-2015 in setting of alcohol withdrawal in hospital   History of seizure    03-08-2012  alcohol withdrawal   History of thrombocytopenia    10/ 2013  in setting of alcohol withdrawal   Hyperlipidemia    Hypertension    Port-A-Cath in place 02/20/2021   Prostate cancer [facility]) urologist-  dr dalhstedt/  oncologist- dr Tammi Klippel   T1c, Gleason 3+3,  PSA 8.89,  vol 27cc   Psoriasis, 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 male requires anticoagulation with a heparin iv infusion for the indication of  stroke. Heparin gtt will be started  following pharmacy protocol per pharmacy consult. Patient is not on previous oral anticoagulant that will require aPTT/HL correlation before transitioning to only HL monitoring.   Goal of Therapy:  Heparin level 0.3-0.7 units/ml Monitor platelets by anticoagulation protocol: Yes   Plan:  Give 0 per MD units bolus x 1 Start heparin infusion at 700 units/hr Check anti-Xa level in 6 hours and daily while on heparin Continue to monitor H&H and platelets   Donna Christen Kamber [place] 06/20/2021,6:27 PM

## 2021-06-20 NOTE — ED Notes (Signed)
Pt and family aware a urine specimen is needed. Will notify staff when one can be obtained.

## 2021-06-20 NOTE — H&P (Signed)
History and Physical  Stanley Jimenez:[id number] DOB: [birth year] DOA: 06/20/2021  Referring physician: Dr Rogene Houston, ED physician PCP: [name], MD  Outpatient Specialists:   Patient Coming From: home  Chief Complaint: Aphasia  HPI: Stanley Jimenez is a 66 y.o. 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  Patient seen for aphasia that started earlier this morning.  As patient is unable to communicate very well, history is obtained by the patient's brother.  He states that he was last seen well last night around 6:54 PM.  He woke this morning and was having difficulty communicating with the patient's sister, who lives next to him.  She called the patient's brother who came and EMS was called.  Patient brought to the hospital.  No palliating provoking factors.  Emergency Department Course: Head and neck was done showing left frontal stroke.  Additionally, there appears to be a thrombus in the patient's right IJ distal to his Port-A-Cath.  White count 24  Review of Systems:    Pt denies any fevers, chills, nausea, vomiting, diarrhea, constipation, abdominal pain, shortness of breath, dyspnea on exertion, orthopnea, cough, wheezing, palpitations, headache, vision changes, lightheadedness, dizziness, melena, rectal bleeding.  Review of systems are otherwise negative  Past Medical History:  Diagnosis Date   Alcohol abuse, in remission    SINCE 10- 2015   Anxiety    Arthritis    Cirrhosis, alcoholic (HCC)    Clubbing of fingers    congenital   Depression    Full dentures    History of PSVT (paroxysmal supraventricular tachycardia)    run of non-sustatined VT 03-10-2015 in setting of alcohol withdrawal in hospital   History of seizure    03-08-2012  alcohol withdrawal   History of thrombocytopenia    10/ 2013  in setting of alcohol withdrawal    Hyperlipidemia    Hypertension    Port-A-Cath in place 02/20/2021   Prostate cancer [facility]) urologist-  dr dalhstedt/  oncologist- dr Tammi Klippel   T1c, Gleason 3+3,  PSA 8.89,  vol 27cc   Psoriasis, 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   Seizures ([place])    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   Squamous cell carcinoma of esophagus ([place]) 04/21/2016   Past Surgical History:  Procedure Laterality Date   BIOPSY  04/14/2016   Procedure: BIOPSY;  Surgeon: Rogene Houston, MD;  [facility];  Service: Endoscopy;;  esophagus   COLONOSCOPY N/A 04/14/2016   Procedure: COLONOSCOPY;  Surgeon: Rogene Houston, MD;  [facility];  Service: Endoscopy;  Laterality: N/A;   ESOPHAGOGASTRODUODENOSCOPY N/A 04/14/2016   Procedure: ESOPHAGOGASTRODUODENOSCOPY (EGD);  Surgeon: Rogene Houston, MD;  [facility];  Service: Endoscopy;  Laterality: N/A;  1:00   NO PAST SURGERIES     PORTACATH PLACEMENT Right 05/31/2016   Procedure: INSERTION OF TUNNELED RIGHT INTERNAL JUGULAR BARD POWERPORT CENTRAL VENOUS CATHETER WITH SUBCUTANEOUS PORT;  Surgeon: Vickie Epley, MD;  Location: [facility];  Service: Vascular;  Laterality: Right;   RADIOACTIVE SEED IMPLANT N/A 09/20/2014   Procedure: RADIOACTIVE SEED IMPLANT;  Surgeon: Franchot Gallo, MD;  Location: [facility];  Service: Urology;  Laterality: N/A;    77   seeds implanted no seeds found in bladder   TRANSURETHRAL RESECTION OF PROSTATE     Social History:  reports that he quit smoking about 7 months  ago. His smoking use included cigarettes. He has a 10.00 pack-year smoking history. He quit smokeless tobacco use about 37 years ago.  His smokeless tobacco use included chew. He reports that he does not currently use alcohol. He reports that he does not currently use drugs. Patient lives at home  No Known Allergies  Family History  Problem Relation Age of Onset   Alcohol abuse Mother    Diabetes Sister       Prior  to Admission medications   Medication Sig Start Date End Date Taking? Authorizing Provider  amLODipine (NORVASC) 10 MG tablet TAKE 1 TABLET BY MOUTH DAILY 02/03/21   [name], MD  fluorouracil CALGB 09628 2,400 mg/m2 in sodium chloride 0.9 % 150 mL Inject 2,400 mg/m2 into the vein over 48 hr.    [provider]  FLUOROURACIL IV Inject into the vein every 14 (fourteen) days.    [provider]  LEUCOVORIN CALCIUM IV Inject into the vein every 14 (fourteen) days. 02/23/21   [provider]  lidocaine-prilocaine (EMLA) cream Apply a small amount to port a cath site and cover with plastic wrap 1 hour prior to chemotherapy appointments Patient not taking: Reported on 06/16/2021 02/20/21   Derek Jack, MD  megestrol (MEGACE) 400 MG/10ML suspension Take 10 mLs (400 mg total) by mouth 2 (two) times daily. 04/06/21   Derek Jack, MD  OXALIPLATIN IV Inject into the vein every 14 (fourteen) days. 02/23/21   [provider]  oxyCODONE-acetaminophen (PERCOCET) 10-325 MG tablet Take 1 tablet by mouth every 8 (eight) hours as needed for pain. 05/26/21   Derek Jack, MD  prochlorperazine (COMPAZINE) 10 MG tablet Take 1 tablet (10 mg total) by mouth every 6 (six) hours as needed (Nausea or vomiting). Patient not taking: Reported on 06/16/2021 02/20/21   Derek Jack, MD  tamsulosin (FLOMAX) 0.4 MG CAPS capsule TAKE 1 CAPSULE(0.4 MG) BY MOUTH DAILY 11/27/20   [name], MD  triamcinolone cream (KENALOG) 0.1 % APPLY TO THE AFFECTED AREA TWICE DAILY 02/17/21   [name], MD    Physical Exam: BP 135/70    Pulse 91    Temp 99.2 F (37.3 C) (Oral)    Resp 19    Ht 5' 8.5" (1.74 m)    Wt 59 kg    SpO2 100%    BMI 19.48 kg/m   General: Elderly male. Awake and alert and oriented x3. No acute cardiopulmonary distress.  HEENT: Normocephalic atraumatic.  Right and left ears normal in appearance.  Pupils equal, round, reactive to  light. Extraocular muscles are intact. Sclerae anicteric and noninjected.  Moist mucosal membranes. No mucosal lesions.  Neck: Neck supple without lymphadenopathy. No carotid bruits. No masses palpated.  Cardiovascular: Regular rate with normal S1-S2 sounds. No murmurs, rubs, gallops auscultated. No JVD.  Respiratory: Good respiratory effort with no wheezes, rales, rhonchi. Lungs clear to auscultation bilaterally.  No accessory muscle use. Abdomen: Soft, nontender, nondistended. Active bowel sounds. No masses or hepatosplenomegaly  Skin: No rashes, lesions, or ulcerations.  Dry, warm to touch. 2+ dorsalis pedis and radial pulses. Musculoskeletal: No calf or leg pain. All major joints not erythematous nontender.  No upper or lower joint deformation.  Good ROM.  No contractures  Psychiatric: Intact judgment and insight. Pleasant and cooperative. Neurologic: Patient aphasic.  Strength is 5/5 and symmetric in upper and lower extremities.  Cranial nerves II through XII are grossly intact.  Labs on Admission: I have personally reviewed following labs and imaging studies  CBC: Recent Labs  Lab 06/16/21 0847 06/20/21 1026 06/20/21 1028  WBC 21.1*  --  24.0*  NEUTROABS 15.4*  --  20.1*  HGB 9.1* 12.2* 8.8*  HCT 28.0* 36.0* 28.0*  MCV 85.9  --  87.2  PLT 133*  --  96*   Basic Metabolic Panel: Recent Labs  Lab 06/16/21 0847 06/20/21 1026 06/20/21 1028  NA 137 134* 134*  K 4.0 4.7 3.8  CL 104 104 104  CO2 22  --  24  GLUCOSE 163* 107* 106*  BUN 19 22 17   CREATININE 1.12 0.80 0.98  CALCIUM 9.3  --  9.7  MG 2.1  --   --    GFR: Estimated Creatinine Clearance: 64.4 mL/min (by C-G formula based on SCr of 0.98 mg/dL). Liver Function Tests: Recent Labs  Lab 06/16/21 0847 06/20/21 1028  AST 42* 43*  ALT 34 35  ALKPHOS 269* 222*  BILITOT 0.4 0.3  PROT 6.8 6.7  ALBUMIN 3.0* 2.9*   No results for input(s): LIPASE, AMYLASE in the last 168 hours. Recent Labs  Lab  06/20/21 1028  AMMONIA 24   Coagulation Profile: Recent Labs  Lab 06/20/21 1028  INR 1.3*   Cardiac Enzymes: No results for input(s): CKTOTAL, CKMB, CKMBINDEX, TROPONINI in the last 168 hours. BNP (last 3 results) No results for input(s): PROBNP in the last 8760 hours. HbA1C: No results for input(s): HGBA1C in the last 72 hours. CBG: No results for input(s): GLUCAP in the last 168 hours. Lipid Profile: No results for input(s): CHOL, HDL, LDLCALC, TRIG, CHOLHDL, LDLDIRECT in the last 72 hours. Thyroid Function Tests: No results for input(s): TSH, T4TOTAL, FREET4, T3FREE, THYROIDAB in the last 72 hours. Anemia Panel: No results for input(s): VITAMINB12, FOLATE, FERRITIN, TIBC, IRON, RETICCTPCT in the last 72 hours. Urine analysis:    Component Value Date/Time   COLORURINE YELLOW 06/20/2021 1448   APPEARANCEUR CLEAR 06/20/2021 1448   LABSPEC 1.020 06/20/2021 1448   PHURINE 6.0 06/20/2021 1448   GLUCOSEU NEGATIVE 06/20/2021 1448   HGBUR LARGE (A) 06/20/2021 1448   BILIRUBINUR NEGATIVE 06/20/2021 1448   KETONESUR NEGATIVE 06/20/2021 1448   PROTEINUR TRACE (A) 06/20/2021 1448   UROBILINOGEN 0.2 03/08/2012 1221   NITRITE NEGATIVE 06/20/2021 1448   LEUKOCYTESUR NEGATIVE 06/20/2021 1448   Sepsis Labs: @LABRCNTIP (procalcitonin:4,lacticidven:4) ) Recent Results (from the past 240 hour(s))  Resp Panel by RT-PCR (Flu A&B, Covid) Nasopharyngeal Swab     Status: None   Collection Time: 06/20/21 10:19 AM   Specimen: Nasopharyngeal Swab; Nasopharyngeal(NP) swabs in vial transport medium  Result Value Ref Range Status   SARS Coronavirus 2 by RT PCR NEGATIVE NEGATIVE Final    Comment: (NOTE) SARS-CoV-2 target nucleic acids are NOT DETECTED.  The SARS-CoV-2 RNA is generally detectable in upper respiratory specimens during the acute phase of infection. The lowest concentration of SARS-CoV-2 viral copies this assay can detect is 138 copies/mL. A negative result does not preclude  SARS-Cov-2 infection and should not be used as the sole basis for treatment or other patient management decisions. A negative result may occur with  improper specimen collection/handling, submission of specimen other than nasopharyngeal swab, presence of viral mutation(s) within the areas targeted by this assay, and inadequate number of viral copies(<138 copies/mL). A negative result must be combined with clinical observations, patient history, and epidemiological information. The expected result is Negative.  Fact Sheet for Patients:  EntrepreneurPulse.com.au  Fact Sheet  for Healthcare Providers:  IncredibleEmployment.be  This test is no t yet approved or cleared by the [place] and  has been authorized for detection and/or diagnosis of SARS-CoV-2 by FDA under an Emergency Use Authorization (EUA). This EUA will remain  in effect (meaning this test can be used) for the duration of the COVID-19 declaration under Section 564(b)(1) of the Act, 21 U.S.C.section 360bbb-3(b)(1), unless the authorization is terminated  or revoked sooner.       Influenza A by PCR NEGATIVE NEGATIVE Final   Influenza B by PCR NEGATIVE NEGATIVE Final    Comment: (NOTE) The Xpert Xpress SARS-CoV-2/FLU/RSV plus assay is intended as an aid in the diagnosis of influenza from Nasopharyngeal swab specimens and should not be used as a sole basis for treatment. Nasal washings and aspirates are unacceptable for Xpert Xpress SARS-CoV-2/FLU/RSV testing.  Fact Sheet for Patients: EntrepreneurPulse.com.au  Fact Sheet for Healthcare Providers: IncredibleEmployment.be  This test is not yet approved or cleared by the [place] FDA and has been authorized for detection and/or diagnosis of SARS-CoV-2 by FDA under an Emergency Use Authorization (EUA). This EUA will remain in effect (meaning this test can be used) for the duration of  the COVID-19 declaration under Section 564(b)(1) of the Act, 21 U.S.C. section 360bbb-3(b)(1), unless the authorization is terminated or revoked.  Performed at [facility], [address] [place], [place] [address]      Radiological Exams on Admission: CT HEAD WO CONTRAST  Result Date: 06/20/2021 CLINICAL DATA:  Altered mental status this morning. EXAM: CT HEAD WITHOUT CONTRAST TECHNIQUE: Contiguous axial images were obtained from the base of the skull through the vertex without intravenous contrast. RADIATION DOSE REDUCTION: This exam was performed according to the departmental dose-optimization program which includes automated exposure control, adjustment of the mA and/or kV according to patient size and/or use of iterative reconstruction technique. COMPARISON:  03/08/2012 FINDINGS: Brain: No evidence of acute infarction, hemorrhage, hydrocephalus, extra-axial collection or mass lesion/mass effect. Mild ventricular sulcal enlargement reflecting mild volume loss, stable from the prior head CT. Mild areas of white matter hypoattenuation are also noted, also stable, consistent with chronic microvascular ischemic change. Vascular: No hyperdense vessel or unexpected calcification. Skull: Normal. Negative for fracture or focal lesion. Sinuses/Orbits: Visualized globes and orbits are unremarkable. Visualized sinuses are clear. Other: None. IMPRESSION: 1. No acute intracranial abnormalities. No significant change from prior study. Electronically Signed   By: Lajean Manes M.D.   On: 06/20/2021 11:08   DG Chest Port 1 View  Result Date: 06/20/2021 CLINICAL DATA:  Esophageal cancer EXAM: PORTABLE CHEST 1 VIEW COMPARISON:  July thirteenth 2022 FINDINGS: The cardiomediastinal silhouette is unchanged in contour.RIGHT chest port with tip terminating over the inferior RIGHT atrium. No pleural effusion. No pneumothorax. No acute pleuroparenchymal abnormality. Visualized abdomen is unremarkable. Osseous metastatic  disease with multiple lytic lesions of bilateral ribs, LEFT scapula and likely RIGHT proximal humeral. Likely pathologic rib fracture noted in the LEFT posterior sixth rib, RIGHT first rib and RIGHT seventh rib. IMPRESSION: No acute cardiopulmonary abnormality. Revisualization of osseous metastatic disease. Electronically Signed   By: Valentino Saxon M.D.   On: 06/20/2021 17:25   CT ANGIO HEAD NECK W WO CM W PERF (CODE STROKE)  Result Date: 06/20/2021 CLINICAL DATA:  Altered mental status EXAM: CT ANGIOGRAPHY HEAD AND NECK CT PERFUSION BRAIN TECHNIQUE: Multidetector CT imaging of the head and neck was performed using the standard protocol during bolus administration of intravenous contrast. Multiplanar CT image reconstructions and  MIPs were obtained to evaluate the vascular anatomy. Carotid stenosis measurements (when applicable) are obtained utilizing NASCET criteria, using the distal internal carotid diameter as the denominator. Multiphase CT imaging of the brain was performed following IV bolus contrast injection. Subsequent parametric perfusion maps were calculated using RAPID software. RADIATION DOSE REDUCTION: This exam was performed according to the departmental dose-optimization program which includes automated exposure control, adjustment of the mA and/or kV according to patient size and/or use of iterative reconstruction technique. CONTRAST:  146mL OMNIPAQUE IOHEXOL 350 MG/ML SOLN COMPARISON:  Same-day noncontrast head CT, brain MRI 05/04/2021 FINDINGS: CTA NECK FINDINGS Aortic arch: There is mild calcified atherosclerotic plaque of the aortic arch. The origins of the major branch vessels are patent. The subclavian arteries are patent. Right carotid system: The right common carotid artery is patent. The right external carotid artery is patent. There is mixed plaque in the proximal right internal carotid artery resulting in focal high-grade stenosis, approximally 80% (9-63, 6-184). The internal  carotid artery distal to this point is widely patent. Left carotid system: The left common carotid artery is patent. Left external carotid artery is patent. There is mixed plaque in the proximal left internal carotid artery resulting in approximally 30-40% stenosis. The distal left internal carotid artery is patent. Vertebral arteries: There is soft plaque in the proximal right V1 segment resulting in mild stenosis. Remainder of the right vertebral artery is patent. There is multifocal mixed plaque in the proximal left vertebral artery resulting in severe stenosis in the V1 segment (6-265), and multifocal moderate to severe stenosis of the proximal V2 segment (6-235). The distal left V2 segment and V3 segment are patent. Skeleton: There are extensive lytic and sclerotic lesions throughout the imaged cervical spine, clavicles, and sternum consistent with metastatic disease. Other neck: The soft tissues are unremarkable. Upper chest: The imaged lung apices are clear. Review of the MIP images confirms the above findings CTA HEAD FINDINGS Anterior circulation: The intracranial ICAs are patent. The bilateral MCAs are patent. The bilateral ACAs are patent. The anterior communicating artery is normal. There is no aneurysm or AVM. Posterior circulation: The bilateral V4 segments are patent. PICA is identified bilaterally. The basilar artery is patent. The bilateral PCAs are patent. The posterior communicating arteries are not identified. There is no aneurysm or AVM. Venous sinuses: The dural venous sinuses are patent. There is suspected thrombus in the right internal jugular vein (7-146). Anatomic variants: None. Review of the MIP images confirms the above findings CT Brain Perfusion Findings: Note that the patient moved during the CT perfusion acquisition. ASPECTS: 9 CBF (<30%) Volume: 45mL Perfusion (Tmax>6.0s) volume: 72mL Mismatch Volume: 26mL Infarction Location:Left frontal operculum IMPRESSION: 1. Patient moved  during the CT perfusion acquisition, likely affecting the calculated core and penumbra. The 11 cc infarct core identified in the left frontal operculum is likely real, as in retrospect there is loss of gray-white differentiation in this location on the prior noncontrast head CT. 37 cc penumbra is identified scattered in the left cerebral hemisphere, though this is likely at least in part artifactual. Consider correlation with MRI to evaluate the extent of ischemia. 2. No emergent large vessel occlusion in the head. 3. High-grade (approximally 80%) in the proximal right internal carotid artery. 4. Severe stenosis in the proximal left V1 segment and multifocal moderate to severe stenosis in the proximal left V2 segment. 5. Mixed plaque in the proximal left ICA resulting in up to approximately 30-40% stenosis. Patent right vertebral artery. 6. Suspected thrombus  in the right internal jugular vein above the level of the central venous catheter. Consider further evaluation with ultrasound. 7. Extensive osseous metastatic disease. These results were called by telephone at the time of interpretation on 06/20/2021 at 4:37 pm to provider Dr Rogene Houston , who verbally acknowledged these results. Electronically Signed   By: Valetta Mole M.D.   On: 06/20/2021 16:42    EKG: Independently reviewed.  Sinus rhythm with probable left atrial enlargement.  Old anterior septal infarct.  Assessment/Plan: Principal Problem:   Ischemic stroke of frontal lobe (HCC) Active Problems:   Essential hypertension   Malignant tumor of lower third of esophagus (HCC)   PAD (peripheral artery disease) ([place])   Port-A-Cath in place   Acute thrombosis of right internal jugular vein ([place])    This patient was discussed with the ED physician, including pertinent vitals, physical exam findings, labs, and imaging.  We also discussed care given by the ED provider.  Ischemic stroke of frontal lobe Admit to [facility] Patient discussed with  neuro hospitalist.  They will see the patient on arrival MR brain with and without contrast recommended PT/OT's/speech N.p.o. Permissive hypertension Echocardiogram Acute thrombus of right IJ Per neuro hospitalist, heparin drip recommended by stroke protocol Leukocytosis Unsure of etiology.  Patient without fevers, chills or infectious source Hypertension Permissive hypertension Malignant tumor of the lower third of esophagus Peripheral artery disease  DVT prophylaxis: Heparin Consultants: Neurology Code Status: Full code Family Communication: Brother present Disposition Plan: Pending   Truett Mainland, DO

## 2021-06-20 NOTE — ED Notes (Signed)
Attempted to in an out cath pt x2. Resistance met both times. Unable to obtain a urine specimen.

## 2021-06-20 NOTE — Plan of Care (Signed)
Patient concern for stroke-perfusion imaging with left-sided 11 cc core and 37 cc penumbra but I think the penumbra is over read from artifact over the bone. Any case-patient will be transferred to [facility] for further evaluation. Hospitalist call with a specific question-CTA head and neck revealed a right IJ thrombus and the need to anticoagulate him. I recommended heparin stroke protocol-no bolus. Patient will be seen by the stroke neurology team when he gets to [place] discussed with Dr. Sheran Lawless  -- Amie Portland, MD Neurologist [facility] Pager: [phone]

## 2021-06-20 NOTE — ED Notes (Signed)
NT and this RN attempted to obtain urine specimen. Pt became extremely agitated. Assisted back into the bed. PIV accidentally removed by pt.

## 2021-06-20 NOTE — ED Notes (Signed)
Attempted report x1. 

## 2021-06-20 NOTE — ED Provider Notes (Signed)
[facility] EMERGENCY DEPARTMENT Provider Note   CSN: [id number] Arrival date & time: 06/20/21  0944     History  Chief Complaint  Patient presents with   Altered Mental Status    Stanley Jimenez is a 66 y.o. male.   Altered Mental Status Associated symptoms: no fever    Patient has history of cirrhosis, SVT, hyperlipidemia, seizures, prostate cancer, and esophageal cancer who presents with altered mental status.  Family states the symptoms started last evening.  This morning the patient is unable to speak clearly or answer any questions appropriately.  No known trauma.  No known fevers or chills.  In the ER the patient looks at me but only is intermittently following some commands.  He is not unable to answer any questions appropriately.  History was provided by the patient's son who is at the bedside  Additional information obtained from the patient's outpatient records.  Patient's last infusion was on January 24 at the [facility].  Patient was supposed to get Opdivo and FOLFOX but only received saline.  There were plans to obtain an MRI to rule out metastases Home Medications Prior to Admission medications   Medication Sig Start Date End Date Taking? Authorizing Provider  amLODipine (NORVASC) 10 MG tablet TAKE 1 TABLET BY MOUTH DAILY 02/03/21   [name], MD  fluorouracil CALGB 61443 2,400 mg/m2 in sodium chloride 0.9 % 150 mL Inject 2,400 mg/m2 into the vein over 48 hr.    [provider]  FLUOROURACIL IV Inject into the vein every 14 (fourteen) days.    [provider]  LEUCOVORIN CALCIUM IV Inject into the vein every 14 (fourteen) days. 02/23/21   [provider]  lidocaine-prilocaine (EMLA) cream Apply a small amount to port a cath site and cover with plastic wrap 1 hour prior to chemotherapy appointments Patient not taking: Reported on 06/16/2021 02/20/21   Derek Jack, MD  megestrol (MEGACE) 400 MG/10ML suspension  Take 10 mLs (400 mg total) by mouth 2 (two) times daily. 04/06/21   Derek Jack, MD  OXALIPLATIN IV Inject into the vein every 14 (fourteen) days. 02/23/21   [provider]  oxyCODONE-acetaminophen (PERCOCET) 10-325 MG tablet Take 1 tablet by mouth every 8 (eight) hours as needed for pain. 05/26/21   Derek Jack, MD  prochlorperazine (COMPAZINE) 10 MG tablet Take 1 tablet (10 mg total) by mouth every 6 (six) hours as needed (Nausea or vomiting). Patient not taking: Reported on 06/16/2021 02/20/21   Derek Jack, MD  tamsulosin (FLOMAX) 0.4 MG CAPS capsule TAKE 1 CAPSULE(0.4 MG) BY MOUTH DAILY 11/27/20   [name], MD  triamcinolone cream (KENALOG) 0.1 % APPLY TO THE AFFECTED AREA TWICE DAILY 02/17/21   [name], MD      Allergies    Patient has no known allergies.    Review of Systems   Review of Systems  Constitutional:  Negative for fever.   Physical Exam Updated Vital Signs Ht 1.74 m (5' 8.5")    Wt 59 kg    BMI 19.48 kg/m  Physical Exam Vitals and nursing note reviewed.  Constitutional:      General: He is not in acute distress.    Appearance: He is well-developed.  HENT:     Head: Normocephalic and atraumatic.     Right Ear: External ear normal.     Left Ear: External ear normal.  Eyes:     General: No scleral icterus.  Right eye: No discharge.        Left eye: No discharge.     Conjunctiva/sclera: Conjunctivae normal.  Neck:     Trachea: No tracheal deviation.  Cardiovascular:     Rate and Rhythm: Normal rate and regular rhythm.  Pulmonary:     Effort: Pulmonary effort is normal. No respiratory distress.     Breath sounds: Normal breath sounds. No stridor. No wheezing or rales.  Abdominal:     General: Bowel sounds are normal. There is no distension.     Palpations: Abdomen is soft.     Tenderness: There is no abdominal tenderness. There is no guarding or rebound.  Musculoskeletal:        General: No tenderness  or deformity.     Cervical back: Neck supple.  Skin:    General: Skin is warm and dry.     Findings: No rash.  Neurological:     Mental Status: He is alert.     Cranial Nerves: No cranial nerve deficit (no facial droop, EOM intact).     Sensory: No sensory deficit.     Motor: No abnormal muscle tone or seizure activity.     Coordination: Coordination normal.     Comments: Patient moving his left and right arm.  Patient was able to walk although will not follow commands and move his legs when asked to, speech is unintelligible  Psychiatric:        Mood and Affect: Mood normal.    ED Results / Procedures / Treatments   Labs (all labs ordered are listed, but only abnormal results are displayed) Labs Reviewed  PROTIME-INR - Abnormal; Notable for the following components:      Result Value   Prothrombin Time 16.1 (*)    INR 1.3 (*)    All other components within normal limits  CBC - Abnormal; Notable for the following components:   WBC 24.0 (*)    RBC 3.21 (*)    Hemoglobin 8.8 (*)    HCT 28.0 (*)    RDW 23.4 (*)    Platelets 96 (*)    All other components within normal limits  DIFFERENTIAL - Abnormal; Notable for the following components:   Neutro Abs 20.1 (*)    Monocytes Absolute 1.9 (*)    Abs Immature Granulocytes 0.26 (*)    All other components within normal limits  COMPREHENSIVE METABOLIC PANEL - Abnormal; Notable for the following components:   Sodium 134 (*)    Glucose, Bld 106 (*)    Albumin 2.9 (*)    AST 43 (*)    Alkaline Phosphatase 222 (*)    All other components within normal limits  RAPID URINE DRUG SCREEN, HOSP PERFORMED - Abnormal; Notable for the following components:   Opiates POSITIVE (*)    All other components within normal limits  URINALYSIS, ROUTINE W REFLEX MICROSCOPIC - Abnormal; Notable for the following components:   Hgb urine dipstick LARGE (*)    Protein, ur TRACE (*)    All other components within normal limits  BLOOD GAS, VENOUS -  Abnormal; Notable for the following components:   pCO2, Ven 41.6 (*)    pO2, Ven <31.0 (*)    All other components within normal limits  URINALYSIS, MICROSCOPIC (REFLEX) - Abnormal; Notable for the following components:   Bacteria, UA FEW (*)    All other components within normal limits  CBC - Abnormal; Notable for the following components:   WBC 23.9 (*)  RBC 3.39 (*)    Hemoglobin 9.0 (*)    HCT 29.1 (*)    RDW 23.5 (*)    Platelets 93 (*)    All other components within normal limits  I-STAT CHEM 8, ED - Abnormal; Notable for the following components:   Sodium 134 (*)    Glucose, Bld 107 (*)    Hemoglobin 12.2 (*)    HCT 36.0 (*)    All other components within normal limits  RESP PANEL BY RT-PCR (FLU A&B, COVID) ARPGX2  ETHANOL  APTT  AMMONIA  HIV ANTIBODY (ROUTINE TESTING W REFLEX)  LIPID PANEL  HEMOGLOBIN A1C  HEPARIN LEVEL (UNFRACTIONATED)    EKG EKG Interpretation  Date/Time:  Saturday June 20 2021 10:09:28 EST Ventricular Rate:  99 PR Interval:  176 QRS Duration: 85 QT Interval:  322 QTC Calculation: 414 R Axis:   65 Text Interpretation: Sinus rhythm Probable left atrial enlargement Low voltage, extremity leads Anteroseptal infarct, old Since last tracing rate faster Confirmed by Dorie Rank [phone]) on 06/20/2021 10:25:01 AM  Radiology CT HEAD WO CONTRAST  Result Date: 06/20/2021 CLINICAL DATA:  Altered mental status this morning. EXAM: CT HEAD WITHOUT CONTRAST TECHNIQUE: Contiguous axial images were obtained from the base of the skull through the vertex without intravenous contrast. RADIATION DOSE REDUCTION: This exam was performed according to the departmental dose-optimization program which includes automated exposure control, adjustment of the mA and/or kV according to patient size and/or use of iterative reconstruction technique. COMPARISON:  03/08/2012 FINDINGS: Brain: No evidence of acute infarction, hemorrhage, hydrocephalus, extra-axial collection or  mass lesion/mass effect. Mild ventricular sulcal enlargement reflecting mild volume loss, stable from the prior head CT. Mild areas of white matter hypoattenuation are also noted, also stable, consistent with chronic microvascular ischemic change. Vascular: No hyperdense vessel or unexpected calcification. Skull: Normal. Negative for fracture or focal lesion. Sinuses/Orbits: Visualized globes and orbits are unremarkable. Visualized sinuses are clear. Other: None. IMPRESSION: 1. No acute intracranial abnormalities. No significant change from prior study. Electronically Signed   By: Lajean Manes M.D.   On: 06/20/2021 11:08   MR BRAIN WO CONTRAST  Result Date: 06/21/2021 CLINICAL DATA:  Acute neurologic deficit EXAM: MRI HEAD WITHOUT CONTRAST TECHNIQUE: Multiplanar, multiecho pulse sequences of the brain and surrounding structures were obtained without intravenous contrast. COMPARISON:  05/04/2021 FINDINGS: Brain: Multiple bilateral acute infarcts, the largest of which is located in the left frontal operculum. The next largest area is in the left occipital lobe. The other areas are punctate. Multiple vascular territories are affected. No acute or chronic hemorrhage. There is multifocal hyperintense T2-weighted signal within the white matter. Generalized volume loss without a clear lobar predilection. The midline structures are normal. Vascular: Major flow voids are preserved. Skull and upper cervical spine: Normal calvarium and skull base. Visualized upper cervical spine and soft tissues are normal. Sinuses/Orbits:No paranasal sinus fluid levels or advanced mucosal thickening. No mastoid or middle ear effusion. Normal orbits. IMPRESSION: Multiple bilateral acute infarcts affecting multiple vascular territories, the largest of which are located in the left frontal operculum and left occipital lobe. 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 Electronically Signed   By: Ulyses Jarred M.D.   On: 06/21/2021 01:27    DG Chest Port 1 View  Result Date: 06/20/2021 CLINICAL DATA:  Esophageal cancer EXAM: PORTABLE CHEST 1 VIEW COMPARISON:  July thirteenth 2022 FINDINGS: The cardiomediastinal silhouette is unchanged in contour.RIGHT chest port with tip terminating over the inferior RIGHT atrium. No pleural effusion. No  pneumothorax. No acute pleuroparenchymal abnormality. Visualized abdomen is unremarkable. Osseous metastatic disease with multiple lytic lesions of bilateral ribs, LEFT scapula and likely RIGHT proximal humeral. Likely pathologic rib fracture noted in the LEFT posterior sixth rib, RIGHT first rib and RIGHT seventh rib. IMPRESSION: No acute cardiopulmonary abnormality. Revisualization of osseous metastatic disease. Electronically Signed   By: Valentino Saxon M.D.   On: 06/20/2021 17:25   CT ANGIO HEAD NECK W WO CM W PERF (CODE STROKE)  Result Date: 06/20/2021 CLINICAL DATA:  Altered mental status EXAM: CT ANGIOGRAPHY HEAD AND NECK CT PERFUSION BRAIN TECHNIQUE: Multidetector CT imaging of the head and neck was performed using the standard protocol during bolus administration of intravenous contrast. Multiplanar CT image reconstructions and MIPs were obtained to evaluate the vascular anatomy. Carotid stenosis measurements (when applicable) are obtained utilizing NASCET criteria, using the distal internal carotid diameter as the denominator. Multiphase CT imaging of the brain was performed following IV bolus contrast injection. Subsequent parametric perfusion maps were calculated using RAPID software. RADIATION DOSE REDUCTION: This exam was performed according to the departmental dose-optimization program which includes automated exposure control, adjustment of the mA and/or kV according to patient size and/or use of iterative reconstruction technique. CONTRAST:  1102mL OMNIPAQUE IOHEXOL 350 MG/ML SOLN COMPARISON:  Same-day noncontrast head CT, brain MRI 05/04/2021 FINDINGS: CTA NECK FINDINGS Aortic arch: There  is mild calcified atherosclerotic plaque of the aortic arch. The origins of the major branch vessels are patent. The subclavian arteries are patent. Right carotid system: The right common carotid artery is patent. The right external carotid artery is patent. There is mixed plaque in the proximal right internal carotid artery resulting in focal high-grade stenosis, approximally 80% (9-63, 6-184). The internal carotid artery distal to this point is widely patent. Left carotid system: The left common carotid artery is patent. Left external carotid artery is patent. There is mixed plaque in the proximal left internal carotid artery resulting in approximally 30-40% stenosis. The distal left internal carotid artery is patent. Vertebral arteries: There is soft plaque in the proximal right V1 segment resulting in mild stenosis. Remainder of the right vertebral artery is patent. There is multifocal mixed plaque in the proximal left vertebral artery resulting in severe stenosis in the V1 segment (6-265), and multifocal moderate to severe stenosis of the proximal V2 segment (6-235). The distal left V2 segment and V3 segment are patent. Skeleton: There are extensive lytic and sclerotic lesions throughout the imaged cervical spine, clavicles, and sternum consistent with metastatic disease. Other neck: The soft tissues are unremarkable. Upper chest: The imaged lung apices are clear. Review of the MIP images confirms the above findings CTA HEAD FINDINGS Anterior circulation: The intracranial ICAs are patent. The bilateral MCAs are patent. The bilateral ACAs are patent. The anterior communicating artery is normal. There is no aneurysm or AVM. Posterior circulation: The bilateral V4 segments are patent. PICA is identified bilaterally. The basilar artery is patent. The bilateral PCAs are patent. The posterior communicating arteries are not identified. There is no aneurysm or AVM. Venous sinuses: The dural venous sinuses are patent.  There is suspected thrombus in the right internal jugular vein (7-146). Anatomic variants: None. Review of the MIP images confirms the above findings CT Brain Perfusion Findings: Note that the patient moved during the CT perfusion acquisition. ASPECTS: 9 CBF (<30%) Volume: 68mL Perfusion (Tmax>6.0s) volume: 38mL Mismatch Volume: 38mL Infarction Location:Left frontal operculum IMPRESSION: 1. Patient moved during the CT perfusion acquisition, likely affecting the calculated core and  penumbra. The 11 cc infarct core identified in the left frontal operculum is likely real, as in retrospect there is loss of gray-white differentiation in this location on the prior noncontrast head CT. 37 cc penumbra is identified scattered in the left cerebral hemisphere, though this is likely at least in part artifactual. Consider correlation with MRI to evaluate the extent of ischemia. 2. No emergent large vessel occlusion in the head. 3. High-grade (approximally 80%) in the proximal right internal carotid artery. 4. Severe stenosis in the proximal left V1 segment and multifocal moderate to severe stenosis in the proximal left V2 segment. 5. Mixed plaque in the proximal left ICA resulting in up to approximately 30-40% stenosis. Patent right vertebral artery. 6. Suspected thrombus in the right internal jugular vein above the level of the central venous catheter. Consider further evaluation with ultrasound. 7. Extensive osseous metastatic disease. These results were called by telephone at the time of interpretation on 06/20/2021 at 4:37 pm to provider Dr Rogene Houston , who verbally acknowledged these results. Electronically Signed   By: Valetta Mole M.D.   On: 06/20/2021 16:42    Procedures .Critical Care Performed by: Dorie Rank, MD Authorized by: Dorie Rank, MD   Critical care provider statement:    Critical care time (minutes):  30   Critical care was time spent personally by me on the following activities:  Development of  treatment plan with patient or surrogate, discussions with consultants, evaluation of patient's response to treatment, examination of patient, ordering and review of laboratory studies, ordering and review of radiographic studies, ordering and performing treatments and interventions, pulse oximetry, re-evaluation of patient's condition and review of old charts    Medications Ordered in ED Medications  sodium chloride 0.9 % bolus 500 mL (has no administration in time range)    Followed by  0.9 %  sodium chloride infusion (has no administration in time range)    ED Course/ Medical Decision Making/ A&P Clinical Course as of 06/21/21 0708  Sat Jun 20, 2021  1054 Blood gas, venous (at [facility] and [facility], not at [facility])(!!) Normal pH, no hypercarbia [JK]  1055 I-stat chem 8, ED(!) No significant electrolyte abnormalities noted [JK]  1443 CBC(!) CBC shows leukocytosis, similar to previous values, anemia stable compared to previous values [JK]  1444 Head CT without acute changes [JK]  1518 Previous imaging tests reviewed.  Patient had abnormal MRI in December of last year that showed possible subacute infarcts versus metastatic disease.  Repeat MRI was recommended [JK]    Clinical Course User Index [JK] Dorie Rank, MD                           Medical Decision Making Amount and/or Complexity of Data Reviewed Labs: ordered. Decision-making details documented in ED Course. Radiology: ordered.  Risk Prescription drug management. Decision regarding hospitalization.  Aphasia Patient with new onset aphasia.  Presentation concerning for the possibility of stroke versus metastatic disease.  Labs do not show any other metabolic abnormalities to account for his change in mental status.  Patient continues to be able to use all extremities without difficulty.  He is responding appropriately otherwise.  Initial head CT without acute findings.  I did review prior imaging tests and patient had abnormality of his  MRI back in December and repeat imaging was recommended.  Neuro hospitalist was consulted, Dr Blanca Friend  They recommended CT angio of the head and neck to help determine timing of additional  imaging, MRI.  Metastatic cancer Patient with known metastatic disease.  Otherwise appears stable at this time  Case was turned over to Dr. Virl Cagey pending the CT angiogram.  Patient was also noted to have an IJ thrombus.  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  Patient was started on IV heparin patient was admitted to the hospital for further treatment and evaluation       Final Clinical Impression(s) / ED Diagnoses Acute stroke, IJ thrombus, metastatic cancer   Dorie Rank, MD 06/21/21 [phone]

## 2021-06-20 NOTE — ED Provider Notes (Signed)
Patient turned over to me by Dr. Alton Revere.  Patient presented with aphasia.  We will asked known normal time was 7 PM last evening.  Patient woke up this morning at 8 AM with incomprehensible speech.  Past medical history is significant for esophageal cancer.  Patient was evaluated by the telemetry specialist teleneurology consult service.  The recommendation is admission for stroke work-up.  No LVO on the CTA head and neck suspected thrombus in the right internal jugular vein above the level of the central venous catheter.  So no evidence of any acute thrombus.  But there is evidence in the frontal lobe on CTA of concerns for CVA.  Get chest x-ray because of the finding of the catheter thrombus.  We will discussed with hospitalist.  Ultrasound Doppler studies not available here.  May need to discuss with vascular surgery.  May need transfer to [facility]  If so we will have to discuss with the neurologist on-call there.   Fredia Sorrow, MD 06/20/21 [phone]

## 2021-06-20 NOTE — Consult Note (Signed)
[facility] TeleNeurology Consult Services  Stat Consult  Patient Name:   Stanley Jimenez, Stanley Jimenez Date of Birth:   [birth year] Identification Number:   MRN - [id number] Date of Service:   06/20/2021 15:12:40  Diagnosis:       R47.01 - Aphasia  Impression 66 year old male with a past medical history of HTN presents with symptoms of aphasia. Last known well time was last between 6:30 PM - 7 PM. The patient woke up this morning at 8 AM with incomprehenisble speech. NIHHS scale is 8. Recommend admission for stroke work up. CTH loss of gray-white differentiation in this location on the prior noncontrast head CT. No LVO on CTA head and neck. Suspected thrombus in the right internal jugular vein above the level of the central venous catheter. ED provider is aware.  CT HEAD: As Per Radiologist CT Head Showed No Acute Hemorrhage or Acute Core Infarct  Our recommendations are outlined below.  Diagnostic Studies: Recommend MRI brain without contrast Transthoracic Echo with bubble study, if available  Laboratory Studies: Recommend Lipid panel  Medications: Initiate Aspirin 81 mg daily Statins for LDL goal less than 70 Permissive hypertension, Antihypertensives with prn for first 24-48 hrs post stroke onset. If BP greater than 220/120 give Labetalol IV or Vasotec IV  Consultations: Recommend Speech therapy if failed dysphagia screen Physical therapy/Occupational therapy   Imaging CTH loss of gray-white differentiation in this location on the prior noncontrast head CT  Metrics: [facility] Notification Time: 06/20/2021 15:10:27 Stamp Time: 06/20/2021 15:12:40 Callback Response Time: 06/20/2021 15:13:16   ----------------------------------------------------------------------------------------------------  Chief Complaint: Aphasia  History of Present Illness: Patient is a 66 year old Male. 66 year old male with a past medical history of HTN presents with symptoms  of aphasia. Last known wel time was last between 6:30 PM - 7 PM. The patient woke up this morning at 8 AM with incomprehenisble speech. Exam is significant for severe aphasia.     Past Medical History:      Hypertension      There is no history of Diabetes Mellitus      There is no history of Hyperlipidemia      There is no history of Coronary Artery Disease      There is no history of Stroke  Medications:  No Anticoagulant use  No Antiplatelet use Reviewed EMR for current medications  Allergies:  Reviewed  Social History: Drug Use: No  Family History:  There is no family history of premature cerebrovascular disease pertinent to this consultation  ROS : 14 Points Review of Systems was performed and was negative except mentioned in HPI.  Past Surgical History: There Is No Surgical History Contributory To Todays Visit    Examination: BP(136/67), Pulse(92), Blood Glucose(106) 1A: Level of Consciousness - Alert; keenly responsive + 0 1B: Ask Month and Age - Both Questions Right + 0 1C: Blink Eyes & Squeeze Hands - Performs 1 Task + 1 2: Test Horizontal Extraocular Movements - Normal + 0 3: Test Visual Fields - No Visual Loss + 0 4: Test Facial Palsy (Use Grimace if Obtunded) - Normal symmetry + 0 5A: Test Left Arm Motor Drift - No Drift for 10 Seconds + 0 5B: Test Right Arm Motor Drift - No Drift for 10 Seconds + 0 6A: Test Left Leg Motor Drift - Drift, but doesn't hit bed + 1 6B: Test Right Leg Motor Drift - Drift, but doesn't hit bed + 1 7: Test Limb Ataxia (FNF/Heel-Shin) - Does Not Understand +  0 8: Test Sensation - Normal; No sensory loss + 0 9: Test Language/Aphasia - Mute/Global Aphasia: No Usable Speech/Auditory Comprehension + 3 10: Test Dysarthria - Mute/Anarthric + 2 11: Test Extinction/Inattention - No abnormality + 0  NIHSS Score: 8     Patient / Family was informed the Neurology Consult would occur via TeleHealth consult by way of interactive audio  and video telecommunications and consented to receiving care in this manner.  Patient is being evaluated for possible acute neurologic impairment and high probability of imminent or life - threatening deterioration.I spent total of 35 minutes providing care to this patient, including time for face to face visit via telemedicine, review of medical records, imaging studies and discussion of findings with providers, the patient and / or family.   Dr [name] Priest   [facility] [phone]  Case [id number]

## 2021-06-20 NOTE — ED Triage Notes (Signed)
Pt brought in by brother for AMS that started this morning. Pt lives alone and is normally oriented x 4. Brother reports sister came over to talk to him this morning and he was talking out of his head. Pt will follow some commands, but not all. Pt appears confused.

## 2021-06-21 ENCOUNTER — Inpatient Hospital Stay (HOSPITAL_COMMUNITY): Payer: Medicaid Other

## 2021-06-21 DIAGNOSIS — I6389 Other cerebral infarction: Secondary | ICD-10-CM

## 2021-06-21 DIAGNOSIS — F1021 Alcohol dependence, in remission: Secondary | ICD-10-CM

## 2021-06-21 DIAGNOSIS — I6521 Occlusion and stenosis of right carotid artery: Secondary | ICD-10-CM

## 2021-06-21 DIAGNOSIS — I639 Cerebral infarction, unspecified: Secondary | ICD-10-CM | POA: Diagnosis not present

## 2021-06-21 DIAGNOSIS — I69320 Aphasia following cerebral infarction: Secondary | ICD-10-CM

## 2021-06-21 DIAGNOSIS — C7889 Secondary malignant neoplasm of other digestive organs: Secondary | ICD-10-CM

## 2021-06-21 DIAGNOSIS — Z87891 Personal history of nicotine dependence: Secondary | ICD-10-CM

## 2021-06-21 DIAGNOSIS — I70202 Unspecified atherosclerosis of native arteries of extremities, left leg: Secondary | ICD-10-CM

## 2021-06-21 DIAGNOSIS — I10 Essential (primary) hypertension: Secondary | ICD-10-CM

## 2021-06-21 DIAGNOSIS — C61 Malignant neoplasm of prostate: Secondary | ICD-10-CM

## 2021-06-21 LAB — ECHOCARDIOGRAM COMPLETE
AR max vel: 1.9 cm2
AV Area VTI: 1.87 cm2
AV Area mean vel: 1.83 cm2
AV Mean grad: 8 mmHg
AV Peak grad: 15.5 mmHg
AV Vena cont: 0.4 cm
Ao pk vel: 1.97 m/s
Area-P 1/2: 4.29 cm2
Calc EF: 49.2 %
Height: 68.5 in
P 1/2 time: 378 msec
S' Lateral: 3.7 cm
Single Plane A2C EF: 50 %
Single Plane A4C EF: 50.8 %
Weight: 1992.96 oz

## 2021-06-21 LAB — LIPID PANEL
Cholesterol: 143 mg/dL (ref 0–200)
HDL: 57 mg/dL (ref >40)
LDL Cholesterol: 70 mg/dL (ref 0–99)
Total CHOL/HDL Ratio: 2.5 RATIO
Triglycerides: 79 mg/dL (ref <150)
VLDL: 16 mg/dL (ref 0–40)

## 2021-06-21 LAB — CBC
HCT: 29.1 % — ABNORMAL LOW (ref 39.0–52.0)
Hemoglobin: 9 g/dL — ABNORMAL LOW (ref 13.0–17.0)
MCH: 26.5 pg (ref 26.0–34.0)
MCHC: 30.9 g/dL (ref 30.0–36.0)
MCV: 85.8 fL (ref 80.0–100.0)
Platelets: 93 10*3/uL — ABNORMAL LOW (ref 150–400)
RBC: 3.39 MIL/uL — ABNORMAL LOW (ref 4.22–5.81)
RDW: 23.5 % — ABNORMAL HIGH (ref 11.5–15.5)
WBC: 23.9 10*3/uL — ABNORMAL HIGH (ref 4.0–10.5)
nRBC: 0 % (ref 0.0–0.2)

## 2021-06-21 LAB — HEPARIN LEVEL (UNFRACTIONATED)
Heparin Unfractionated: 0.14 IU/mL — ABNORMAL LOW (ref 0.30–0.70)
Heparin Unfractionated: <0.1 IU/mL — ABNORMAL LOW (ref 0.30–0.70)
Heparin Unfractionated: 0.14 IU/mL — ABNORMAL LOW (ref 0.30–0.70)

## 2021-06-21 LAB — HIV ANTIBODY (ROUTINE TESTING W REFLEX): HIV Screen 4th Generation wRfx: NONREACTIVE

## 2021-06-21 LAB — GLUCOSE, CAPILLARY: Glucose-Capillary: 97 mg/dL (ref 70–99)

## 2021-06-21 MED ORDER — GADOBUTROL 1 MMOL/ML IV SOLN
5.5000 mL | Freq: Once | INTRAVENOUS | Status: AC | PRN
  Administered 2021-06-21: 5.5 mL via INTRAVENOUS

## 2021-06-21 MED ORDER — CHLORHEXIDINE GLUCONATE CLOTH 2 % EX PADS
6.0000 | MEDICATED_PAD | Freq: Every day | CUTANEOUS | Status: DC
  Administered 2021-06-22 – 2021-06-24 (×3): 6 via TOPICAL

## 2021-06-21 NOTE — TOC Initial Note (Signed)
Transition of Care [facility]) - Initial/Assessment Note    Patient Details  Name: Stanley Jimenez MRN: [id number] Date of Birth: [birth year]  Transition of Care [facility]) CM/SW Contact:    Carles Collet, RN Phone Number: 06/21/2021, 7:44 AM  Clinical Narrative:          Patient from home alone, presenting with aphagia and CVA. 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 TOC following for transition needs. Records do not indicate any prior SNF or HH activity. PT OT ordered and pending.           Expected Discharge Plan:  (TBD) Barriers to Discharge: Continued Medical Work up   Patient Goals and CMS Choice        Expected Discharge Plan and Services Expected Discharge Plan:  (TBD) In-house Referral: Clinical Social Work Discharge Planning Services: CM Consult                                          Prior Living Arrangements/Services   Lives with:: Self                   Activities of Daily Living Home Assistive Devices/Equipment: None ADL Screening (condition at time of admission) Patient's cognitive ability adequate to safely complete daily activities?: No Is the patient deaf or have difficulty hearing?: No Does the patient have difficulty seeing, even when wearing glasses/contacts?: No Does the patient have difficulty concentrating, remembering, or making decisions?: Yes Patient able to express need for assistance with ADLs?: No Does the patient have difficulty dressing or bathing?: Yes Independently performs ADLs?: No Communication: Dependent Is this a change from baseline?: Change from baseline, expected to last >3 days Dressing (OT): Dependent Is this a change from baseline?: Change from baseline, expected to last >3 days Grooming: Dependent Is this a change from baseline?: Change from baseline, expected to last >3 days Feeding: Dependent Is this a change from baseline?: Change from baseline, expected to last >3  days Bathing: Dependent Is this a change from baseline?: Change from baseline, expected to last >3 days Toileting: Dependent Is this a change from baseline?: Change from baseline, expected to last >3days In/Out Bed: Dependent Is this a change from baseline?: Change from baseline, expected to last >3 days Walks in Home: Dependent Is this a change from baseline?: Change from baseline, expected to last >3 days Does the patient have difficulty walking or climbing stairs?: Yes Weakness of Legs: None Weakness of Arms/Hands: None  Permission Sought/Granted                  Emotional Assessment              Admission diagnosis:  Aphasia [R47.01] Ischemic stroke of frontal lobe ([place]) [I63.9] Cerebrovascular accident (CVA), unspecified mechanism ([place]) [I63.9] Patient Active Problem List   Diagnosis Date Noted   Ischemic stroke of frontal lobe ([place]) 06/20/2021   Acute thrombosis of right internal jugular vein ([place]) 06/20/2021   Leukocytosis 06/20/2021   Encounter for annual physical exam 03/17/2021   Port-A-Cath in place 02/20/2021   Aortic atherosclerosis ([place]) 07/09/2020   Episodic cigarette smoking dependence 11/06/2019   PAD (peripheral artery disease) ([place]) 11/06/2019   Abnormal CT scan, chest 10/29/2016   Malignant tumor of lower third of esophagus ([place]) 04/21/2016   Malignant neoplasm of prostate ([place]) 08/14/2014   HLD (hyperlipidemia) 11/09/2011   Psoriasis,  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 [place] [place] [address] Phone: [phone] Fax: [phone]     Social Determinants of Health (SDOH) Interventions    Readmission Risk Interventions No flowsheet data found.

## 2021-06-21 NOTE — Progress Notes (Signed)
ANTICOAGULATION CONSULT NOTE   Pharmacy Consult for heparin gtt  Indication: stroke  No Known Allergies  Patient Measurements: Height: 5' 8.5" (174 cm) Weight: 56.5 kg (124 lb 9 oz) IBW/kg (Calculated) : 69.55 Heparin Dosing Weight: HEPARIN DW (KG): 59   Vital Signs: Temp: 98.5 F (36.9 C) (01/28 2349) Temp Source: Oral (01/28 2349) BP: 139/71 (01/28 2349) Pulse Rate: 82 (01/28 2349)  Labs: Recent Labs    06/20/21 1026 06/20/21 1028 06/21/21 0222  HGB 12.2* 8.8* 9.0*  HCT 36.0* 28.0* 29.1*  PLT  --  96* 93*  APTT  --  35  --   LABPROT  --  16.1*  --   INR  --  1.3*  --   CREATININE 0.80 0.98  --      Estimated Creatinine Clearance: 61.7 mL/min (by C-G formula based on SCr of 0.98 mg/dL).   Medical History: Past Medical History:  Diagnosis Date   Alcohol abuse, in remission    SINCE 10- 2015   Anxiety    Arthritis    Cirrhosis, alcoholic ([place])    Clubbing of fingers    congenital   Depression    Full dentures    History of PSVT (paroxysmal supraventricular tachycardia)    run of non-sustatined VT 03-10-2015 in setting of alcohol withdrawal in hospital   History of seizure    03-08-2012  alcohol withdrawal   History of thrombocytopenia    10/ 2013  in setting of alcohol withdrawal   Hyperlipidemia    Hypertension    Port-A-Cath in place 02/20/2021   Prostate cancer [facility]) urologist-  dr dalhstedt/  oncologist- dr Tammi Klippel   T1c, Gleason 3+3,  PSA 8.89,  vol 27cc   Psoriasis, 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   Seizures ([place])    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      FLUOROURACIL IV Inject into the  vein every 14 (fourteen) days.      LEUCOVORIN CALCIUM IV Inject into the vein every 14 (fourteen) days.      lidocaine-prilocaine (EMLA) cream Apply a small amount to port a cath site and cover with plastic wrap 1 hour prior to chemotherapy appointments (Patient not taking: Reported on 06/16/2021) 30 g 3    megestrol (MEGACE) 400 MG/10ML suspension Take 10 mLs (400 mg total) by mouth 2 (two) times daily. 480 mL 2    OXALIPLATIN IV Inject into the vein every 14 (fourteen) days.      oxyCODONE-acetaminophen (PERCOCET) 10-325 MG tablet Take 1 tablet by mouth every 8 (eight) hours as needed for pain. 84 tablet 0    prochlorperazine (COMPAZINE) 10 MG tablet Take 1 tablet (10 mg total) by mouth every 6 (six) hours as needed (Nausea or vomiting). (Patient not taking: Reported on 06/16/2021) 30 tablet 1    tamsulosin (FLOMAX) 0.4 MG CAPS capsule TAKE 1 CAPSULE(0.4 MG) BY MOUTH DAILY 90 capsule 1    triamcinolone cream (KENALOG) 0.1 % APPLY TO THE AFFECTED AREA TWICE DAILY 454 g 0    Scheduled:   aspirin  300 mg Rectal Daily   Or  aspirin  325 mg Oral Daily   Infusions:   sodium chloride 100 mL/hr (06/20/21 1402)   sodium chloride 100 mL/hr at 06/20/21 2346   heparin 700 Units/hr (06/20/21 1857)   PRN:  Anti-infectives (From admission, onward)    None       Assessment: Stanley Jimenez a 66 y.o. male requires anticoagulation with a heparin iv infusion for the indication of  stroke. Heparin gtt will be started following pharmacy protocol per pharmacy consult. Patient is not on previous oral anticoagulant that will require aPTT/HL correlation before transitioning to only HL monitoring.   1/29 AM update:  Heparin level 0.14-not crossing over in Epic-but showing in lab reports  Goal of Therapy:  Heparin level 0.3-0.5 units/mL Monitor platelets by anticoagulation protocol: Yes   Plan:  Inc heparin to 850 units/hr 1200 heparin level   Narda Bonds, PharmD, BCPS Clinical  Pharmacist Phone: [phone]

## 2021-06-21 NOTE — Progress Notes (Signed)
Blood clots in urine noted. Small amount.

## 2021-06-21 NOTE — Progress Notes (Addendum)
STROKE TEAM PROGRESS NOTE   INTERVAL HISTORY No family at bedside. Patient unable to communicate well. Per H&P, patient was aphasic with LKW 6:54 PM 06/20/21. Patient unable to follow commands well.  WBC 23.9. CTA showed RIJ thrombus on IV heparin. Echo EF 55-60%.   Vitals:   06/20/21 2048 06/20/21 2223 06/20/21 2349 06/21/21 0400  BP: 131/68 128/68 139/71 131/74  Pulse: 91 84 82   Resp: 14 19 17    Temp:  98.4 F (36.9 C) 98.5 F (36.9 C) 98.5 F (36.9 C)  TempSrc:  Oral Oral Oral  SpO2: 98% 99% 100% 98%  Weight:  56.5 kg    Height:       CBC:  Recent Labs  Lab 06/16/21 0847 06/20/21 1026 06/20/21 1028 06/21/21 0222  WBC 21.1*  --  24.0* 23.9*  NEUTROABS 15.4*  --  20.1*  --   HGB 9.1*   < > 8.8* 9.0*  HCT 28.0*   < > 28.0* 29.1*  MCV 85.9  --  87.2 85.8  PLT 133*  --  96* 93*   < > = values in this interval not displayed.   Basic Metabolic Panel:  Recent Labs  Lab 06/16/21 0847 06/20/21 1026 06/20/21 1028  NA 137 134* 134*  K 4.0 4.7 3.8  CL 104 104 104  CO2 22  --  24  GLUCOSE 163* 107* 106*  BUN 19 22 17   CREATININE 1.12 0.80 0.98  CALCIUM 9.3  --  9.7  MG 2.1  --   --    Lipid Panel:  Recent Labs  Lab 06/21/21 0222  CHOL 143  TRIG 79  HDL 57  CHOLHDL 2.5  VLDL 16  LDLCALC 70   HgbA1c: No results for input(s): HGBA1C in the last 168 hours. Urine Drug Screen:  Recent Labs  Lab 06/20/21 1448  LABOPIA POSITIVE*  COCAINSCRNUR NONE DETECTED  LABBENZ NONE DETECTED  AMPHETMU NONE DETECTED  THCU NONE DETECTED  LABBARB NONE DETECTED    Alcohol Level  Recent Labs  Lab 06/20/21 1028  ETH <10    IMAGING past 24 hours CT HEAD WO CONTRAST  Result Date: 06/20/2021 CLINICAL DATA:  Altered mental status this morning. EXAM: CT HEAD WITHOUT CONTRAST TECHNIQUE: Contiguous axial images were obtained from the base of the skull through the vertex without intravenous contrast. RADIATION DOSE REDUCTION: This exam was performed according to the departmental  dose-optimization program which includes automated exposure control, adjustment of the mA and/or kV according to patient size and/or use of iterative reconstruction technique. COMPARISON:  03/08/2012 FINDINGS: Brain: No evidence of acute infarction, hemorrhage, hydrocephalus, extra-axial collection or mass lesion/mass effect. Mild ventricular sulcal enlargement reflecting mild volume loss, stable from the prior head CT. Mild areas of white matter hypoattenuation are also noted, also stable, consistent with chronic microvascular ischemic change. Vascular: No hyperdense vessel or unexpected calcification. Skull: Normal. Negative for fracture or focal lesion. Sinuses/Orbits: Visualized globes and orbits are unremarkable. Visualized sinuses are clear. Other: None. IMPRESSION: 1. No acute intracranial abnormalities. No significant change from prior study. Electronically Signed   By: Lajean Manes M.D.   On: 06/20/2021 11:08   MR BRAIN WO CONTRAST  Result Date: 06/21/2021 CLINICAL DATA:  Acute neurologic deficit EXAM: MRI HEAD WITHOUT CONTRAST TECHNIQUE: Multiplanar, multiecho pulse sequences of the brain and surrounding structures were obtained without intravenous contrast. COMPARISON:  05/04/2021 FINDINGS: Brain: Multiple bilateral acute infarcts, the largest of which is located in the left frontal operculum. The next largest area  is in the left occipital lobe. The other areas are punctate. Multiple vascular territories are affected. No acute or chronic hemorrhage. There is multifocal hyperintense T2-weighted signal within the white matter. Generalized volume loss without a clear lobar predilection. The midline structures are normal. Vascular: Major flow voids are preserved. Skull and upper cervical spine: Normal calvarium and skull base. Visualized upper cervical spine and soft tissues are normal. Sinuses/Orbits:No paranasal sinus fluid levels or advanced mucosal thickening. No mastoid or middle ear effusion.  Normal orbits. IMPRESSION: Multiple bilateral acute infarcts affecting multiple vascular territories, the largest of which are located in the left frontal operculum and left occipital lobe. 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 Electronically Signed   By: Ulyses Jarred M.D.   On: 06/21/2021 01:27   DG Chest Port 1 View  Result Date: 06/20/2021 CLINICAL DATA:  Esophageal cancer EXAM: PORTABLE CHEST 1 VIEW COMPARISON:  July thirteenth 2022 FINDINGS: The cardiomediastinal silhouette is unchanged in contour.RIGHT chest port with tip terminating over the inferior RIGHT atrium. No pleural effusion. No pneumothorax. No acute pleuroparenchymal abnormality. Visualized abdomen is unremarkable. Osseous metastatic disease with multiple lytic lesions of bilateral ribs, LEFT scapula and likely RIGHT proximal humeral. Likely pathologic rib fracture noted in the LEFT posterior sixth rib, RIGHT first rib and RIGHT seventh rib. IMPRESSION: No acute cardiopulmonary abnormality. Revisualization of osseous metastatic disease. Electronically Signed   By: Valentino Saxon M.D.   On: 06/20/2021 17:25   CT ANGIO HEAD NECK W WO CM W PERF (CODE STROKE)  Result Date: 06/20/2021 CLINICAL DATA:  Altered mental status EXAM: CT ANGIOGRAPHY HEAD AND NECK CT PERFUSION BRAIN TECHNIQUE: Multidetector CT imaging of the head and neck was performed using the standard protocol during bolus administration of intravenous contrast. Multiplanar CT image reconstructions and MIPs were obtained to evaluate the vascular anatomy. Carotid stenosis measurements (when applicable) are obtained utilizing NASCET criteria, using the distal internal carotid diameter as the denominator. Multiphase CT imaging of the brain was performed following IV bolus contrast injection. Subsequent parametric perfusion maps were calculated using RAPID software. RADIATION DOSE REDUCTION: This exam was performed according to the departmental dose-optimization program  which includes automated exposure control, adjustment of the mA and/or kV according to patient size and/or use of iterative reconstruction technique. CONTRAST:  143mL OMNIPAQUE IOHEXOL 350 MG/ML SOLN COMPARISON:  Same-day noncontrast head CT, brain MRI 05/04/2021 FINDINGS: CTA NECK FINDINGS Aortic arch: There is mild calcified atherosclerotic plaque of the aortic arch. The origins of the major branch vessels are patent. The subclavian arteries are patent. Right carotid system: The right common carotid artery is patent. The right external carotid artery is patent. There is mixed plaque in the proximal right internal carotid artery resulting in focal high-grade stenosis, approximally 80% (9-63, 6-184). The internal carotid artery distal to this point is widely patent. Left carotid system: The left common carotid artery is patent. Left external carotid artery is patent. There is mixed plaque in the proximal left internal carotid artery resulting in approximally 30-40% stenosis. The distal left internal carotid artery is patent. Vertebral arteries: There is soft plaque in the proximal right V1 segment resulting in mild stenosis. Remainder of the right vertebral artery is patent. There is multifocal mixed plaque in the proximal left vertebral artery resulting in severe stenosis in the V1 segment (6-265), and multifocal moderate to severe stenosis of the proximal V2 segment (6-235). The distal left V2 segment and V3 segment are patent. Skeleton: There are extensive lytic and sclerotic lesions throughout  the imaged cervical spine, clavicles, and sternum consistent with metastatic disease. Other neck: The soft tissues are unremarkable. Upper chest: The imaged lung apices are clear. Review of the MIP images confirms the above findings CTA HEAD FINDINGS Anterior circulation: The intracranial ICAs are patent. The bilateral MCAs are patent. The bilateral ACAs are patent. The anterior communicating artery is normal. There is no  aneurysm or AVM. Posterior circulation: The bilateral V4 segments are patent. PICA is identified bilaterally. The basilar artery is patent. The bilateral PCAs are patent. The posterior communicating arteries are not identified. There is no aneurysm or AVM. Venous sinuses: The dural venous sinuses are patent. There is suspected thrombus in the right internal jugular vein (7-146). Anatomic variants: None. Review of the MIP images confirms the above findings CT Brain Perfusion Findings: Note that the patient moved during the CT perfusion acquisition. ASPECTS: 9 CBF (<30%) Volume: 47mL Perfusion (Tmax>6.0s) volume: 29mL Mismatch Volume: 35mL Infarction Location:Left frontal operculum IMPRESSION: 1. Patient moved during the CT perfusion acquisition, likely affecting the calculated core and penumbra. The 11 cc infarct core identified in the left frontal operculum is likely real, as in retrospect there is loss of gray-white differentiation in this location on the prior noncontrast head CT. 37 cc penumbra is identified scattered in the left cerebral hemisphere, though this is likely at least in part artifactual. Consider correlation with MRI to evaluate the extent of ischemia. 2. No emergent large vessel occlusion in the head. 3. High-grade (approximally 80%) in the proximal right internal carotid artery. 4. Severe stenosis in the proximal left V1 segment and multifocal moderate to severe stenosis in the proximal left V2 segment. 5. Mixed plaque in the proximal left ICA resulting in up to approximately 30-40% stenosis. Patent right vertebral artery. 6. Suspected thrombus in the right internal jugular vein above the level of the central venous catheter. Consider further evaluation with ultrasound. 7. Extensive osseous metastatic disease. These results were called by telephone at the time of interpretation on 06/20/2021 at 4:37 pm to provider Dr Rogene Houston , who verbally acknowledged these results. Electronically Signed   By:  Valetta Mole M.D.   On: 06/20/2021 16:42    PHYSICAL EXAM  Physical Exam  Constitutional: Appears well-developed and well-nourished.  Psych: Affect appropriate to situation Eyes: No scleral injection HENT: No OP obstrucion MSK: no joint deformities.  Cardiovascular: Normal rate and regular rhythm.  Respiratory: Effort normal, non-labored breathing GI: Soft.  No distension. There is no tenderness.  Skin: WDI  Neuro: Mental Status:  Patient is awake, alert, but unclear whether he is oriented. Patient is unable to give coherent history. Receptive and expressive aphasia.   Cranial Nerves: II: Visual Fields are full. Pupils are equal, round, and reactive to light.   III,IV, VI: EOMI without ptosis or diploplia.  V: Facial sensation unable to assess due to noncooperation VII: Facial movement is symmetric resting and smiling VIII: Hearing is intact to voice X: Palate elevates symmetrically XI: Shoulder shrug is symmetric. XII: Tongue protrudes midline without atrophy or fasciculations.  Motor: Tone is normal. Bulk is normal. 5/5 strength was present in all four extremities.  Sensory: Unclear of symmetric sensation due to noncooperation.  Deep Tendon Reflexes: 2+ and symmetric in the biceps and patellae.  Plantars: Toes are downgoing bilaterally.  Coordination: Unable to assess due to noncooperation    ASSESSMENT/PLAN Stanley Jimenez is a 66 y.o. male with history of esophageal cancer currently undergoing chemotherapy, prostate cancer, HLD, PSVT presenting with aphasia.  Stroke: Multiple bilateral acute infarct largest located in Left frontal and occipital lobe likely secondary to embolic source CT head No acute intracranial abnormalities CTA head & neck No LVO, high grade (80%) proximal right internal ICA stenosis (Recommend vascular surgery consult) CT perfusion Left-sided 11 cc core and 37 cc penumbra, possible over read from artifact MRI  - multiple bilateral  acute infarcts affecting multiple vascular territories,  2D Echo EF 55-60% LDL 70 HgbA1c 6.5 VTE prophylaxis - Heparin    Diet   Diet NPO time specified   No antithrombotic prior to admission, now on heparin IV.  Therapy recommendations:  CIR Disposition:  pending  Hypertension Home meds:  Norvasc  Stable Permissive hypertension (OK if < 220/120) but gradually normalize in 5-7 days Long-term BP goal normotensive   Other Stroke Risk Factors Former Cigarette smoker  Other Active Problems Acute thrombus of right IJ Leucocytosis Esophageal Malignancy Peripheral Artery Disease  Hospital day # 1  Stanley Ravens, MD PGY1 Resident  ATTENDING ATTESTATION:  Seen by telemetry specialist yesterday. presented to [facility] emergency department with aphasia.  History of esophageal cancer and prostate cancer undergoing chemotherapy.  Out of window for tPA.  CTA showed no LVO except for right internal jugular vein thrombus above the level of the central venous catheter.  Placed on heparin drip and transferred to [facility] for stroke work-up.  He is still aphasic.  He has right ICA 80% stenosis.  Discussed with primary team neuro consult vascular.  MRI shows embolic appearing stroke.  TTE is negative.  He will need a transesophageal echo to evaluate for thrombus, cardiology contacted.  Consider TCD study tomorrow.  Dr. Reeves Forth evaluated pt independently, reviewed imaging, chart, labs. Discussed and formulated plan with the APP. Please see resident note above for details.   Total 51 minutes spent on counseling patient and coordinating care, writing notes and reviewing chart.  Stanley Gemmill,MD     To contact Stroke Continuity provider, please refer to [email or web address] After hours, contact General Neurology

## 2021-06-21 NOTE — Progress Notes (Addendum)
Inpatient Rehab Admissions Coordinator Note:   Per PT/OT/ST patient was screened for CIR candidacy by Calin Fantroy Danford Bad, CCC-SLP. At this time, pt appears to be a potential candidate for CIR. I will place an order for rehab consult for full assessment, per our protocol.  Please contact me any with questions.Gayland Curry, [place], [place] Admissions Coordinator [phone] 06/21/21 2:12 PM

## 2021-06-21 NOTE — Evaluation (Signed)
Occupational Therapy Evaluation Patient Details Name: Stanley Jimenez MRN: [id number] DOB: 1956/01/25 Today's Date: 06/21/2021   History of Present Illness Pt is a 65 y/o male admitted secondary to aphasia. MRI revealed multiple bilateral acute infarcts. 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   Clinical Impression   Katrell was evaluated s/p the above admission list; his PLOF and home set up are unknown. Pt presents with aphasia and is unable to provide information, attempted to call his brother with no answer. Upon arrival pt was holding his penis, when asked if he needed to urinate he nodded his head "yes." Pt attempted verbalizations throughout, but all garble. Pt followed simple one step commands well throughout. Overall he required min A to stand and mod A take steps at the bed side. No AD used this session due to the urgency of  urination, pt likely to do well with less assist required with use of RW. Overall he currently requires up to mod A for ADLs. Pt noted to have blood on his penis at the end of the session, RN notified. Pt will benefit from OT acutely. Recommending AIR at d/c due to pt's great rehab potential with hops to clarify his PLOF, home set up and available assist from family.      Recommendations for follow up therapy are one component of a multi-disciplinary discharge planning process, led by the attending physician.  Recommendations may be updated based on patient status, additional functional criteria and insurance authorization.   Follow Up Recommendations  Acute inpatient rehab (3hours/day)    Assistance Recommended at Discharge Frequent or constant Supervision/Assistance  Patient can return home with the following A lot of help with walking and/or transfers;A lot of help with bathing/dressing/bathroom;Direct supervision/assist for  medications management;Assist for transportation;Help with stairs or ramp for entrance    Functional Status Assessment  Patient has had a recent decline in their functional status and demonstrates the ability to make significant improvements in function in a reasonable and predictable amount of time.  Equipment Recommendations  Other (comment) (defer; unsure of pt's current DME)    Recommendations for Other Services Rehab consult     Precautions / Restrictions Precautions Precautions: Fall Restrictions Weight Bearing Restrictions: No      Mobility Bed Mobility Overal bed mobility: Needs Assistance Bed Mobility: Supine to Sit, Sit to Supine     Supine to sit: Supervision Sit to supine: Min assist   General bed mobility comments: increased, direct verbal cues. min A for RLE back into bed    Transfers Overall transfer level: Needs assistance Equipment used: 1 person hand held assist Transfers: Sit to/from Stand Sit to Stand: Min assist, Mod assist           General transfer comment: min A to power into standing without AD due to urget need to pee. mod A for small steps at bedside      Balance Overall balance assessment: Needs assistance Sitting-balance support: Feet supported Sitting balance-Leahy Scale: Fair     Standing balance support: No upper extremity supported, During functional activity Standing balance-Leahy Scale: Fair Standing balance comment: pt able to statically stand to pee wtihout UE supported; close min guard for safety                           ADL either performed or assessed with clinical judgement  ADL Overall ADL's : Needs assistance/impaired Eating/Feeding: NPO   Grooming: Minimal assistance;Sitting   Upper Body Bathing: Minimal assistance;Sitting   Lower Body Bathing: Moderate assistance;Sit to/from stand   Upper Body Dressing : Minimal assistance;Sitting   Lower Body Dressing: Moderate assistance;Sit to/from  stand Lower Body Dressing Details (indicate cue type and reason): pt able to doff/don socks with increased time and cues Toilet Transfer: Moderate assistance;Ambulation;Rolling walker (2 wheels)   Toileting- Clothing Manipulation and Hygiene: Minimal assistance;Sitting/lateral lean       Functional mobility during ADLs: Minimal assistance;Moderate assistance General ADL Comments: pt requires simple and direct verbal cues to initiate fucnitonal tasks. sit<>stand completed at bed side this session wihtout AD due to urgent need to urinate. min A to rise and steady. mod A for stepping without AD this session.     Vision Baseline Vision/History: 0 No visual deficits Patient Visual Report: No change from baseline Vision Assessment?: No apparent visual deficits Additional Comments: difficult to assess - seemingly WFL this date     Perception     Praxis      Pertinent Vitals/Pain Pain Assessment Pain Assessment: Faces Faces Pain Scale: Hurts a little bit Pain Location: penis? blood noted. Pain Descriptors / Indicators: Grimacing, Guarding Pain Intervention(s): Monitored during session     Hand Dominance     Extremity/Trunk Assessment Upper Extremity Assessment Upper Extremity Assessment: Difficult to assess due to impaired cognition (pt able to lift BUE over head, grip strength is 4/5. Used BUE funcitonally to use urinal and adjust self in the bed wtih cues.)   Lower Extremity Assessment Lower Extremity Assessment: Defer to PT evaluation   Cervical / Trunk Assessment Cervical / Trunk Assessment: Kyphotic (mild)   Communication Communication Communication: Receptive difficulties;Expressive difficulties   Cognition Arousal/Alertness: Awake/alert Behavior During Therapy: Flat affect Overall Cognitive Status: Difficult to assess                                 General Comments: pt followed simple, direct one step commands 80% of the time. he seemingly  appropriately head nodded "yes" throughout. intially attemtping to impulsively get OOB, follow safety cues and direction     General Comments  VSS on RA. Pt with strong grasp on his penis at the start of the session (indicating need to pee). Urine was dark orange in color. Once supie, noted blood on sheets where pt was sitting. Once supine, noted more blood on pt's penis adn boxers, boxers removed. RN called and present at the end of the session.    Exercises     Shoulder Instructions      Home Living Family/patient expects to be discharged to:: Unsure     Type of Home: House                           Additional Comments: pt with aphasia and unable to provide any reliable information; no family/caregivers present throughout evaluation  Lives With: Alone    Prior Functioning/Environment Prior Level of Function : Patient poor historian/Family not available                        OT Problem List: Decreased range of motion;Decreased strength;Decreased activity tolerance;Impaired balance (sitting and/or standing);Decreased coordination;Decreased cognition;Decreased safety awareness;Decreased knowledge of use of DME or AE;Decreased knowledge of precautions;Pain      OT Treatment/Interventions: Self-care/ADL training;Therapeutic exercise;Balance  training;Patient/family education;Therapeutic activities;DME and/or AE instruction    OT Goals(Current goals can be found in the care plan section) Acute Rehab OT Goals Patient Stated Goal: unable to state OT Goal Formulation: With patient Time For Goal Achievement: 07/05/21 Potential to Achieve Goals: Good ADL Goals Pt Will Perform Grooming: with modified independence;standing Pt Will Perform Lower Body Bathing: with modified independence;sit to/from stand Pt Will Perform Lower Body Dressing: with modified independence;sit to/from stand Pt Will Transfer to Toilet: with modified independence;ambulating  OT Frequency: Min  2X/week    Co-evaluation              AM-PAC OT "6 Clicks" Daily Activity     Outcome Measure Help from another person eating meals?: Total Help from another person taking care of personal grooming?: A Little Help from another person toileting, which includes using toliet, bedpan, or urinal?: A Little Help from another person bathing (including washing, rinsing, drying)?: A Lot Help from another person to put on and taking off regular upper body clothing?: A Little Help from another person to put on and taking off regular lower body clothing?: A Lot 6 Click Score: 14   End of Session Nurse Communication: Mobility status;Other (comment) (blood in peri area)  Activity Tolerance: Patient tolerated treatment well Patient left: in bed;with call bell/phone within reach;with bed alarm set  OT Visit Diagnosis: Other abnormalities of gait and mobility (R26.89);Unsteadiness on feet (R26.81);Muscle weakness (generalized) (M62.81);Pain;Cognitive communication deficit (R41.841) Symptoms and signs involving cognitive functions: Cerebral infarction                Time: 1245-8099 OT Time Calculation (min): 22 min Charges:  OT General Charges $OT Visit: 1 Visit OT Evaluation $OT Eval Moderate Complexity: 1 Mod   Paxten Appelt A Correll Denbow 06/21/2021, 1:20 PM

## 2021-06-21 NOTE — Progress Notes (Signed)
ANTICOAGULATION CONSULT NOTE   Pharmacy Consult for heparin gtt  Indication: stroke  No Known Allergies  Patient Measurements: Height: 5' 8.5" (174 cm) Weight: 56.5 kg (124 lb 9 oz) IBW/kg (Calculated) : 69.55 Heparin Dosing Weight: HEPARIN DW (KG): 59   Vital Signs: Temp: 98.2 F (36.8 C) (01/29 2008) Temp Source: Oral (01/29 2008) BP: 124/65 (01/29 2008) Pulse Rate: 88 (01/29 2008)  Labs: Recent Labs    06/20/21 1026 06/20/21 1028 06/21/21 0222 06/21/21 1230 06/21/21 2007  HGB 12.2* 8.8* 9.0*  --   --   HCT 36.0* 28.0* 29.1*  --   --   PLT  --  96* 93*  --   --   APTT  --  35  --   --   --   LABPROT  --  16.1*  --   --   --   INR  --  1.3*  --   --   --   HEPARINUNFRC  --   --   --  <0.10* 0.14*  CREATININE 0.80 0.98  --   --   --      Estimated Creatinine Clearance: 61.7 mL/min (by C-G formula based on SCr of 0.98 mg/dL).  Medications:   Scheduled:   aspirin  300 mg Rectal Daily   Or   aspirin  325 mg Oral Daily   Infusions:   sodium chloride 100 mL/hr (06/20/21 1402)   sodium chloride 100 mL/hr at 06/21/21 1702   heparin 900 Units/hr (06/21/21 1429)   PRN:    Assessment: Stanley Jimenez a 66 y.o. male requires anticoagulation with a heparin iv infusion for the indication of  stroke. Heparin gtt will be started following pharmacy protocol per pharmacy consult. Patient is not on previous oral anticoagulant that will require aPTT/HL correlation before transitioning to only HL monitoring.   Heparin level this evening 0.14> now running reliably through port.  No overt bleeding or complications noted.  Goal of Therapy:  Heparin level 0.3-0.5 units/mL Monitor platelets by anticoagulation protocol: Yes   Plan:  Increase heparin to 1000 units/hr 8-hour heparin level  Monitor CBC and s/sx of bleed   [place] Crane, Roylene Reason, [facility] Clinical Pharmacist  06/21/2021 8:58 PM   [facility] pharmacy phone numbers are listed on [email or web address]

## 2021-06-21 NOTE — Consult Note (Signed)
Hospital Consult    Reason for Consult:  Right carotid stenosis Referring Physician:  Dr. Pietro Cassis MRN #:  [id number]  History of Present Illness: This is a 66 y.o. male history of peripheral arterial disease with known left SFA occlusion.  He also has metastatic esophageal cancer which he is currently undergoing treatment via right IJ port.  History is obtained from family due to patient's aphasia for which she was brought to the hospital yesterday.  Does not have any motor deficits at this time.  No previous history of stroke.  Per family no history of DVT.  He is a former smoker also has hypertension and hyperlipidemia.  Past Medical History:  Diagnosis Date   Alcohol abuse, in remission    SINCE 10- 2015   Anxiety    Arthritis    Cirrhosis, alcoholic (HCC)    Clubbing of fingers    congenital   Depression    Full dentures    History of PSVT (paroxysmal supraventricular tachycardia)    run of non-sustatined VT 03-10-2015 in setting of alcohol withdrawal in hospital   History of seizure    03-08-2012  alcohol withdrawal   History of thrombocytopenia    10/ 2013  in setting of alcohol withdrawal   Hyperlipidemia    Hypertension    Port-A-Cath in place 02/20/2021   Prostate cancer [facility]) urologist-  dr dalhstedt/  oncologist- dr Tammi Klippel   T1c, Gleason 3+3,  PSA 8.89,  vol 27cc   Psoriasis, 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   Seizures ([place])    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   Squamous cell carcinoma of esophagus ([place]) 04/21/2016    Past Surgical History:  Procedure Laterality Date   BIOPSY  04/14/2016   Procedure: BIOPSY;  Surgeon: Rogene Houston, MD;  [facility];  Service: Endoscopy;;  esophagus   COLONOSCOPY N/A 04/14/2016   Procedure: COLONOSCOPY;  Surgeon: Rogene Houston, MD;  Location: [facility];  Service: Endoscopy;  Laterality: N/A;   ESOPHAGOGASTRODUODENOSCOPY N/A 04/14/2016   Procedure: ESOPHAGOGASTRODUODENOSCOPY (EGD);  Surgeon:  Rogene Houston, MD;  Location: [facility];  Service: Endoscopy;  Laterality: N/A;  1:00   NO PAST SURGERIES     PORTACATH PLACEMENT Right 05/31/2016   Procedure: INSERTION OF TUNNELED RIGHT INTERNAL JUGULAR BARD POWERPORT CENTRAL VENOUS CATHETER WITH SUBCUTANEOUS PORT;  Surgeon: Vickie Epley, MD;  Location: [facility];  Service: Vascular;  Laterality: Right;   RADIOACTIVE SEED IMPLANT N/A 09/20/2014   Procedure: RADIOACTIVE SEED IMPLANT;  Surgeon: Franchot Gallo, MD;  Location[facility];  Service: Urology;  Laterality: N/A;    29   seeds implanted no seeds found in bladder   TRANSURETHRAL RESECTION OF PROSTATE      No Known Allergies  Prior to Admission medications   Medication Sig Start Date End Date Taking? Authorizing Provider  amLODipine (NORVASC) 10 MG tablet TAKE 1 TABLET BY MOUTH DAILY Patient taking differently: Take 10 mg by mouth daily. 02/03/21  Yes [name], MD  fluorouracil CALGB 38101 2,400 mg/m2 in sodium chloride 0.9 % 150 mL Inject 2,400 mg/m2 into the vein over 48 hr.   Yes [provider]  FLUOROURACIL IV Inject into the vein every 14 (fourteen) days.   Yes [provider]  LEUCOVORIN CALCIUM IV Inject into the vein every 14 (fourteen) days. 02/23/21  Yes [provider]  megestrol (MEGACE) 400 MG/10ML suspension Take 10 mLs (400 mg total) by mouth 2 (  two) times daily. 04/06/21  Yes Derek Jack, MD  OXALIPLATIN IV Inject into the vein every 14 (fourteen) days. 02/23/21  Yes [provider]  oxyCODONE-acetaminophen (PERCOCET) 10-325 MG tablet Take 1 tablet by mouth every 8 (eight) hours as needed for pain. 05/26/21  Yes Derek Jack, MD  prochlorperazine (COMPAZINE) 10 MG tablet Take 1 tablet (10 mg total) by mouth every 6 (six) hours as needed (Nausea or vomiting). 02/20/21  Yes Derek Jack, MD  tamsulosin (FLOMAX) 0.4 MG CAPS capsule TAKE 1 CAPSULE(0.4 MG) BY MOUTH DAILY Patient taking  differently: Take 0.4 mg by mouth daily. 11/27/20  Yes [name], MD  triamcinolone cream (KENALOG) 0.1 % APPLY TO THE AFFECTED AREA TWICE DAILY Patient taking differently: Apply 1 application topically 2 (two) times daily. 02/17/21  Yes [name], MD  lidocaine-prilocaine (EMLA) cream Apply a small amount to port a cath site and cover with plastic wrap 1 hour prior to chemotherapy appointments Patient not taking: Reported on 06/16/2021 02/20/21   Derek Jack, MD    Social History   Socioeconomic History   Marital status: Single    Spouse name: Not on file   Number of children: Not on file   Years of education: Not on file   Highest education level: Not on file  Occupational History   Not on file  Tobacco Use   Smoking status: Former    Packs/day: 0.50    Years: 20.00    Pack years: 10.00    Types: Cigarettes    Quit date: 11/12/2020    Years since quitting: 0.6   Smokeless tobacco: Former    Types: Chew    Quit date: 05/27/1984   Tobacco comments:    only smoking a few  Vaping Use   Vaping Use: Never used  Substance and Sexual Activity   Alcohol use: Not Currently    Comment: quit drinking in October 2015. He says before this he says he stayed drunk every day.    Drug use: Not Currently    Comment: last use october 2015   Sexual activity: Never    Birth control/protection: None  Other Topics Concern   Not on file  Social History Narrative   Not on file   Social Determinants of Health   Financial Resource Strain: Not on file  Food Insecurity: Not on file  Transportation Needs: Not on file  Physical Activity: Not on file  Stress: Not on file  Social Connections: Not on file  Intimate Partner Violence: Not on file     Family History  Problem Relation Age of Onset   Alcohol abuse Mother    Diabetes Sister    Review of Systems  Constitutional: Negative.   HENT: Negative.    Eyes: Negative.   Respiratory: Negative.    Cardiovascular:  Negative.   Gastrointestinal:        Esophageal cancer  Musculoskeletal: Negative.   Skin: Negative.   Neurological:        Aphasia  Endo/Heme/Allergies: Negative.   Psychiatric/Behavioral: Negative.       Physical Examination  Vitals:   06/21/21 0844 06/21/21 1323  BP: (!) 147/96 140/60  Pulse:  84  Resp:  16  Temp: 98 F (36.7 C) (!) 97.4 F (36.3 C)  SpO2: 100% 100%   Body mass index is 18.66 kg/m.  General:  nad HENT: WNL, normocephalic Pulmonary: normal non-labored breathing Cardiac: No palpable pedal pulses Abdomen: soft, NT/ND, no masses Extremities: Clubbing of his fingers  Musculoskeletal: no muscle wasting or atrophy  Neurologic: He is awake and alert but aphasic   CBC    Component Value Date/Time   WBC 23.9 (H) 06/21/2021 0222   RBC 3.39 (L) 06/21/2021 0222   HGB 9.0 (L) 06/21/2021 0222   HGB 12.1 (L) 11/21/2020 0857   HCT 29.1 (L) 06/21/2021 0222   HCT 37.6 11/21/2020 0857   PLT 93 (L) 06/21/2021 0222   PLT 250 11/21/2020 0857   MCV 85.8 06/21/2021 0222   MCV 86 11/21/2020 0857   MCH 26.5 06/21/2021 0222   MCHC 30.9 06/21/2021 0222   RDW 23.5 (H) 06/21/2021 0222   RDW 14.2 11/21/2020 0857   LYMPHSABS 1.6 06/20/2021 1028   MONOABS 1.9 (H) 06/20/2021 1028   EOSABS 0.1 06/20/2021 1028   BASOSABS 0.1 06/20/2021 1028    BMET    Component Value Date/Time   NA 134 (L) 06/20/2021 1028   NA 140 11/21/2020 0857   K 3.8 06/20/2021 1028   CL 104 06/20/2021 1028   CO2 24 06/20/2021 1028   GLUCOSE 106 (H) 06/20/2021 1028   BUN 17 06/20/2021 1028   BUN 17 11/21/2020 0857   CREATININE 0.98 06/20/2021 1028   CREATININE 1.22 04/09/2019 0839   CALCIUM 9.7 06/20/2021 1028   GFRNONAA >60 06/20/2021 1028   GFRNONAA 64 04/09/2019 0839   GFRAA >60 01/01/2020 1043   GFRAA 74 04/09/2019 0839    COAGS: Lab Results  Component Value Date   INR 1.3 (H) 06/20/2021   INR 1.2 02/06/2021   INR 1.05 09/13/2014     Non-Invasive Vascular Imaging:   CT  head/neck IMPRESSION: 1. Patient moved during the CT perfusion acquisition, likely affecting the calculated core and penumbra. The 11 cc infarct core identified in the left frontal operculum is likely real, as in retrospect there is loss of gray-white differentiation in this location on the prior noncontrast head CT. 37 cc penumbra is identified scattered in the left cerebral hemisphere, though this is likely at least in part artifactual. Consider correlation with MRI to evaluate the extent of ischemia. 2. No emergent large vessel occlusion in the head. 3. High-grade (approximally 80%) in the proximal right internal carotid artery. 4. Severe stenosis in the proximal left V1 segment and multifocal moderate to severe stenosis in the proximal left V2 segment. 5. Mixed plaque in the proximal left ICA resulting in up to approximately 30-40% stenosis. Patent right vertebral artery. 6. Suspected thrombus in the right internal jugular vein above the level of the central venous catheter. Consider further evaluation with ultrasound. 7. Extensive osseous metastatic disease.  MRI Brain IMPRESSION: Multiple bilateral acute infarcts affecting multiple vascular territories, the largest of which are located in the left frontal operculum and left occipital lobe. 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    ASSESSMENT/PLAN: This is a 66 y.o. 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  He does have high-grade stenosis of his right ICA which appears to be asymptomatic.  Minimal stenosis on the left.  There is an IJ thrombus but no intra atrial shunts to suggest this as a source.  Given his underlying malignancy unsure that he would be a candidate to treat his asymptomatic lesion.  We will follow along.  Osie Merkin C. Donzetta Matters, MD [facility] Office: [phone] Pager: [phone]

## 2021-06-21 NOTE — Progress Notes (Signed)
PROGRESS NOTE  Stanley Jimenez  DOB: Sep 21, 1955  PCP: [name], MD ZRA:[id number]  DOA: 06/20/2021  LOS: 1 day  Hospital Day: 2  Chief Complaint  Patient presents with   Altered Mental Status    Brief narrative: PRATEEK KNIPPLE is a 66 y.o. 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  Patient presented to ED on 1/28 from home for altered mental status. Lives at home alone, at baseline alert, awake, oriented x3.   On 1/28, on the morning of 1/20, he was noted to be having difficulty communicating. Was last seen normal 1 night ago by his brother and sister.  In the ED, patient was afebrile, heart rate in 90s, blood pressure in normal range Labs with sodium 134, creatinine 0.8, WBC count elevated to 24,000, hemoglobin 8.8, platelet 96 Respiratory virus panel negative Blood alcohol level undetectable low CT head normal. MRI brain showed multiple bilateral acute infarcts affecting multiple vascular territories, the largest of which are located in the left frontal operculum and left occipital lobe. 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 CT angio of head and neck did not show any large vessel occlusion but showed high-grade/80% stenosis of the proximal right internal carotid artery, severe stenosis in the proximal left V1 segment and multifocal moderate to severe stenosis in the proximal left V2 segment.  It also showed a suspected thrombus in the right internal jugular vein above the level of the central venous catheter. Extensive osseous metastatic disease. Chest x-ray with known osseous metastasis with multiple lytic lesions of bilateral ribs, LEFT scapula and likely RIGHT proximal humeral. Likely pathologic rib fracture noted in the LEFT posterior sixth rib, RIGHT first rib and RIGHT seventh rib.  Admitted to hospitalist  service Neurology consulted  Subjective: Patient was seen and examined this morning.  Pleasant middle-aged African-American male.  Looks older for his age.  Alert, awake, mumbles to answer any questions.  Able to follow motor commands Chart reviewed Hemodynamically stable Labs this morning with WBC count elevated 24, hemoglobin low at 9, platelets at 93  Assessment/Plan: Acute bilateral infarcts -Presented with altered mental status. -MRI brain as above showing multiple acute bilateral infarcts affecting multiple vascular territories including left frontal lobe, occipital lobe -Cardioembolic etiology suspected -CT angio head and neck finding as above. -Neurology consulted -Stroke work-up initiated. -Pending echocardiogram -A1c 6.5, LDL 51 from July 2022 -PT/OT/ST eval  High-grade stenosis of the proximal right intracarotid artery peripheral artery disease -We will discuss with neurology  Acute thrombus of right IJ -Per neurology recommendation, heparin drip without bolus started  Leukocytosis -Unclear etiology.  No fever.  No other evidence of infection Recent Labs  Lab 06/16/21 0847 06/20/21 1028 06/21/21 0222  WBC 21.1* 24.0* 23.9*   Essential hypertension -On amlodipine 10 mg daily at home -Permissive hypertension for now -Continue to monitor blood pressure  Malignant tumor of the lower third of esophagus -Currently on chemotherapy  BPH -Flomax 0.4 mg daily  Mobility: Pending PT eval Living condition: Lives at home Goals of care:   Code Status: Full Code  Nutritional status: Body mass index is 18.66 kg/m.      Diet:  Diet Order             Diet NPO time specified  Diet effective now                  DVT prophylaxis:  Heparin drip    Antimicrobials: None Fluid: NS@100  mill per hour Consultants: Neurology Family Communication: None at bedside  Status is: Inpatient  Continue in-hospital care because: Ongoing stroke work-up Level of care:  Telemetry Medical   Dispo: The patient is from: Home              Anticipated d/c is to: Hopefully home              Patient currently is not medically stable to d/c.   Difficult to place patient No     Infusions:   sodium chloride 100 mL/hr (06/20/21 1402)   sodium chloride 100 mL/hr at 06/21/21 0844   heparin 850 Units/hr (06/21/21 0359)    Scheduled Meds:  aspirin  300 mg Rectal Daily   Or   aspirin  325 mg Oral Daily    PRN meds: acetaminophen **OR** acetaminophen (TYLENOL) oral liquid 160 mg/5 mL **OR** acetaminophen, HYDROmorphone (DILAUDID) injection, senna-docusate   Antimicrobials: Anti-infectives (From admission, onward)    None       Objective: Vitals:   06/21/21 0400 06/21/21 0844  BP: 131/74 (!) 147/96  Pulse:    Resp:    Temp: 98.5 F (36.9 C) 98 F (36.7 C)  SpO2: 98% 100%    Intake/Output Summary (Last 24 hours) at 06/21/2021 1017 Last data filed at 06/21/2021 0300 Gross per 24 hour  Intake 1363.09 ml  Output --  Net 1363.09 ml   Filed Weights   06/20/21 0956 06/20/21 2223  Weight: 59 kg 56.5 kg   Weight change:  Body mass index is 18.66 kg/m.   Physical Exam: General exam: Pleasant, African-American male.  Looks older for his age.  Not in physical distress skin: No rashes, lesions or ulcers. HEENT: Atraumatic, normocephalic, no obvious bleeding Lungs: Clear to auscultation bilaterally.  Left anterior chest wall with chemotherapy port. CVS: Regular rate and rhythm, no murmur GI/Abd soft, nontender, nondistended, also present CNS: Alert, awake, able to follow motor commands.  Mumbles.  Unable to have a conversation Psychiatry: Sad affect Extremities: No pedal edema, no calf tenderness  Data Review: I have personally reviewed the laboratory data and studies available.  F/u labs ordered Unresulted Labs (From admission, onward)     Start     Ordered   06/22/21 1157  Basic metabolic panel  Tomorrow morning,   R       Question:   Specimen collection method  Answer:  Lab=Lab collect   06/21/21 1017   06/21/21 1200  Heparin level (unfractionated)  Once-Timed,   TIMED       Question:  Specimen collection method  Answer:  Lab=Lab collect   06/21/21 0348   06/21/21 0500  Hemoglobin A1c  (Labs)  Tomorrow morning,   R        06/20/21 2249   06/21/21 0500  CBC  Daily,   R     Question:  Specimen collection method  Answer:  Lab=Lab collect   06/21/21 0004            Signed, Terrilee Croak, MD [facility] 06/21/2021

## 2021-06-21 NOTE — Evaluation (Signed)
Physical Therapy Evaluation Patient Details Name: Stanley Jimenez MRN: [id number] DOB: 08-23-1955 Today's Date: 06/21/2021  History of Present Illness  Pt is a 66 y/o male admitted secondary to aphasia. MRI revealed multiple bilateral acute infarcts. 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   Clinical Impression  Pt presented supine in bed with HOB elevated, awake and willing to participate in therapy session. Pt with significant expressive aphasia and moderate receptive aphasia throughout evaluation. Unable to obtain any information regarding pt's living environment or his PLOF. Pt required min A for bed mobility and was able to maintain an upright sitting position at EOB with close supervision for safety. He had great difficulties following commands, but with multimodal cueing (verbal, visual, tactile, demonstration) he was able to occasionally perform the task. Pt would continue to benefit from skilled physical therapy services at this time while admitted and after d/c to address the below listed limitations in order to improve overall safety and independence with functional mobility.        Recommendations for follow up therapy are one component of a multi-disciplinary discharge planning process, led by the attending physician.  Recommendations may be updated based on patient status, additional functional criteria and insurance authorization.  Follow Up Recommendations Acute inpatient rehab (3hours/day)    Assistance Recommended at Discharge Frequent or constant Supervision/Assistance  Patient can return home with the following  A lot of help with walking and/or transfers;A lot of help with bathing/dressing/bathroom    Equipment Recommendations Other (comment) (TBD, defer to next venue of care)  Recommendations for Other Services  Rehab consult     Functional Status Assessment Patient has had a recent decline in their functional status and demonstrates the ability to make significant improvements in function in a reasonable and predictable amount of time.     Precautions / Restrictions Precautions Precautions: Fall Restrictions Weight Bearing Restrictions: No      Mobility  Bed Mobility Overal bed mobility: Needs Assistance Bed Mobility: Supine to Sit, Sit to Supine     Supine to sit: Min assist Sit to supine: Min assist   General bed mobility comments: increased time and effort needed, multimodal cueing needed, tactile input to upper trunk to achieve an upright sitting position at EOB, tactile input to bilateral LEs to return to supine. Initially pt rolling bilaterally and covering his face with a blanket when PT asked him to sit upright at EOB    Transfers                   General transfer comment: deferred at this time due to safety and pt unable to follow commands    Ambulation/Gait                  Stairs            Wheelchair Mobility    Modified Rankin (Stroke Patients Only) Modified Rankin (Stroke Patients Only) Pre-Morbid Rankin Score: No symptoms Modified Rankin: Moderately severe disability     Balance Overall balance assessment: Needs assistance Sitting-balance support: Feet supported Sitting balance-Leahy Scale: Fair                                       Pertinent Vitals/Pain Pain Assessment Pain Assessment: Faces Faces Pain Scale: No hurt    Home Living  Family/patient expects to be discharged to:: Unsure                   Additional Comments: pt with aphasia and unable to provide any reliable information; no family/caregivers present throughout evaluation    Prior Function Prior Level of Function : Patient poor historian/Family not available                     Hand Dominance        Extremity/Trunk Assessment   Upper Extremity  Assessment Upper Extremity Assessment: Difficult to assess due to impaired cognition;Defer to OT evaluation    Lower Extremity Assessment Lower Extremity Assessment: Difficult to assess due to impaired cognition       Communication   Communication: Expressive difficulties;Receptive difficulties  Cognition Arousal/Alertness: Awake/alert Behavior During Therapy: Flat affect Overall Cognitive Status: Difficult to assess Area of Impairment: Following commands, Safety/judgement, Problem solving                       Following Commands: Follows one step commands inconsistently, Follows one step commands with increased time Safety/Judgement: Decreased awareness of deficits, Decreased awareness of safety   Problem Solving: Slow processing, Decreased initiation, Difficulty sequencing, Requires verbal cues, Requires tactile cues          General Comments      Exercises     Assessment/Plan    PT Assessment Patient needs continued PT services  PT Problem List Decreased strength;Decreased activity tolerance;Decreased balance;Decreased mobility;Decreased coordination;Decreased cognition;Decreased knowledge of use of DME;Decreased safety awareness;Decreased knowledge of precautions       PT Treatment Interventions DME instruction;Gait training;Functional mobility training;Stair training;Therapeutic activities;Therapeutic exercise;Balance training;Neuromuscular re-education;Cognitive remediation;Patient/family education    PT Goals (Current goals can be found in the Care Plan section)  Acute Rehab PT Goals Patient Stated Goal: unable to state PT Goal Formulation: Patient unable to participate in goal setting Time For Goal Achievement: 07/05/21 Potential to Achieve Goals: Good    Frequency Min 4X/week     Co-evaluation               AM-PAC PT "6 Clicks" Mobility  Outcome Measure Help needed turning from your back to your side while in a flat bed without using  bedrails?: None Help needed moving from lying on your back to sitting on the side of a flat bed without using bedrails?: A Little Help needed moving to and from a bed to a chair (including a wheelchair)?: A Little Help needed standing up from a chair using your arms (e.g., wheelchair or bedside chair)?: A Lot Help needed to walk in hospital room?: A Lot Help needed climbing 3-5 steps with a railing? : A Lot 6 Click Score: 16    End of Session   Activity Tolerance: Patient limited by fatigue Patient left: in bed;with call bell/phone within reach;with bed alarm set Nurse Communication: Mobility status PT Visit Diagnosis: Other abnormalities of gait and mobility (R26.89)    Time: 5625-6389 PT Time Calculation (min) (ACUTE ONLY): 13 min   Charges:   PT Evaluation $PT Eval Moderate Complexity: 1 Mod          Eduard Clos, PT, DPT  Acute Rehabilitation Services Office [place] 06/21/2021, 12:24 PM

## 2021-06-21 NOTE — Progress Notes (Signed)
°  Echocardiogram 2D Echocardiogram has been performed.  Fidel Levy 06/21/2021, 10:22 AM

## 2021-06-21 NOTE — Evaluation (Signed)
Speech Language Pathology Evaluation Patient Details Name: Stanley Jimenez MRN: [id number] DOB: Dec 25, 1955 Today's Date: 06/21/2021 Time: 5329-9242 SLP Time Calculation (min) (ACUTE ONLY): 15 min  Problem List:  Patient Active Problem List   Diagnosis Date Noted   Ischemic stroke of frontal lobe ([place]) 06/20/2021   Acute thrombosis of right internal jugular vein ([place]) 06/20/2021   Leukocytosis 06/20/2021   Encounter for annual physical exam 03/17/2021   Port-A-Cath in place 02/20/2021   Aortic atherosclerosis ([place]) 07/09/2020   Episodic cigarette smoking dependence 11/06/2019   PAD (peripheral artery disease) ([place]) 11/06/2019   Abnormal CT scan, chest 10/29/2016   Malignant tumor of lower third of esophagus ([place]) 04/21/2016   Malignant neoplasm of prostate ([place]) 08/14/2014   HLD (hyperlipidemia) 11/09/2011   Psoriasis, 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   H/O alcohol dependence ([place]) 01/26/2008   Essential hypertension 12/22/2007   Past Medical History:  Past Medical History:  Diagnosis Date   Alcohol abuse, in remission    SINCE 10- 2015   Anxiety    Arthritis    Cirrhosis, alcoholic (H[facility])    Clubbing of fingers    congenital   Depression    Full dentures    History of PSVT (paroxysmal supraventricular tachycardia)    run of non-sustatined VT 03-10-2015 in setting of alcohol withdrawal in hospital   History of seizure    03-08-2012  alcohol withdrawal   History of thrombocytopenia    10/ 2013  in setting of alcohol withdrawal   Hyperlipidemia    Hypertension    Port-A-Cath in place 02/20/2021   Prostate cancer [facility]) urologist-  dr dalhstedt/  oncologist- dr Tammi Klippel   T1c, Gleason 3+3,  PSA 8.89,  vol 27cc   Psoriasis, 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   Seizures ([place])    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   Squamous cell carcinoma of esophagus ([place]) 04/21/2016   Past Surgical History:  Past Surgical History:  Procedure Laterality Date   BIOPSY  04/14/2016    Procedure: BIOPSY;  Surgeon: Rogene Houston, MD;  Location[facility];  Service: Endoscopy;;  esophagus   COLONOSCOPY N/A 04/14/2016   Procedure: COLONOSCOPY;  Surgeon: Rogene Houston, MD;  Location: [facility];  Service: Endoscopy;  Laterality: N/A;   ESOPHAGOGASTRODUODENOSCOPY N/A 04/14/2016   Procedure: ESOPHAGOGASTRODUODENOSCOPY (EGD);  Surgeon: Rogene Houston, MD;  Location: [facility];  Service: Endoscopy;  Laterality: N/A;  1:00   NO PAST SURGERIES     PORTACATH PLACEMENT Right 05/31/2016   Procedure: INSERTION OF TUNNELED RIGHT INTERNAL JUGULAR BARD POWERPORT CENTRAL VENOUS CATHETER WITH SUBCUTANEOUS PORT;  Surgeon: Vickie Epley, MD;  Location: [facility];  Service: Vascular;  Laterality: Right;   RADIOACTIVE SEED IMPLANT N/A 09/20/2014   Procedure: RADIOACTIVE SEED IMPLANT;  Surgeon: Franchot Gallo, MD;  Location[facility];  Service: Urology;  Laterality: N/A;    32   seeds implanted no seeds found in bladder   TRANSURETHRAL RESECTION OF PROSTATE     HPI:  Pt is a 66 y/o male admitted secondary to aphasia. MRI revealed multiple bilateral acute infarcts. 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   Assessment / Plan / Recommendation Clinical Impression  Patient presents with a severe cognitive-linguistic and speech impairment as per this initial evaluation. Patient was alert during entire session, but made only two attempts to verbalize and did not initiate any  non-verbal (gestural, etc) communication. Speech intelligibility was close to 0% and SLP unable to determine if he was exhibiting severe dysarthria versus severe language impairment versus severe apraxia of speech. Patient answered yes/no question pertaining to his first and last name and was 30% accurate. He exhibited what apperared to be some motor planning errors when  feeding self applesauce with spoon. He was able to follow command to open mouth and stick out tongue but this was inconsistent. Currently, patient is significantly impaired with his cognition, speech and language abilities. He will require acute level SLP services and anticipate continued SLP services at next venue of care.    SLP Assessment  SLP Recommendation/Assessment: Patient needs continued Speech Lanaguage Pathology Services SLP Visit Diagnosis: Cognitive communication deficit (R41.841);Dysarthria and anarthria (R47.1)    Recommendations for follow up therapy are one component of a multi-disciplinary discharge planning process, led by the attending physician.  Recommendations may be updated based on patient status, additional functional criteria and insurance authorization.    Follow Up Recommendations  Acute inpatient rehab (3hours/day)    Assistance Recommended at Discharge  Frequent or constant Supervision/Assistance  Functional Status Assessment Patient has had a recent decline in their functional status and demonstrates the ability to make significant improvements in function in a reasonable and predictable amount of time.  Frequency and Duration min 2x/week  2 weeks      SLP Evaluation Cognition  Overall Cognitive Status: No family/caregiver present to determine baseline cognitive functioning Arousal/Alertness: Awake/alert Orientation Level: Oriented to person;Disoriented to time;Disoriented to situation;Disoriented to place Attention: Sustained Sustained Attention: Impaired Sustained Attention Impairment: Functional basic;Verbal basic Memory:  (unable to assess due to severe deficits in communication and cognition) Awareness: Impaired Awareness Impairment: Intellectual impairment Problem Solving: Impaired Problem Solving Impairment: Functional basic       Comprehension  Auditory Comprehension Overall Auditory Comprehension: Impaired Yes/No Questions: Impaired Basic  Biographical Questions: 26-50% accurate Commands: Impaired One Step Basic Commands: 0-24% accurate EffectiveTechniques: Extra processing time;Visual/Gestural cues Visual Recognition/Discrimination Discrimination: Not tested Reading Comprehension Reading Status: Not tested    Expression Expression Primary Mode of Expression: Verbal Verbal Expression Overall Verbal Expression: Impaired Initiation: Impaired Pragmatics: Impairment Impairments: Abnormal affect Interfering Components: Attention Non-Verbal Means of Communication: Other (comment) (patient not demonstrating any functional use of verbal or non verbal communication) Written Expression Written Expression: Not tested   Oral / Motor  Oral Motor/Sensory Function Overall Oral Motor/Sensory Function: Other (comment) (no significant flacidity or facial droop on either side, patient able to open mouth but only sticks tongue out minimally, unable to follow most commands for oral motor exam) Motor Speech Phonation: Other (comment);Wet;Low vocal intensity Articulation: Impaired Level of Impairment: Word Intelligibility: Intelligibility reduced Word: 0-24% accurate Phrase: 0-24% accurate Sentence: Not tested Conversation: Not tested Motor Planning: Impaired Level of Impairment: Word Motor Speech Errors: [place], MA, CCC-SLP Speech Therapy

## 2021-06-21 NOTE — Progress Notes (Signed)
ANTICOAGULATION CONSULT NOTE   Pharmacy Consult for heparin gtt  Indication: stroke  No Known Allergies  Patient Measurements: Height: 5' 8.5" (174 cm) Weight: 56.5 kg (124 lb 9 oz) IBW/kg (Calculated) : 69.55 Heparin Dosing Weight: HEPARIN DW (KG): 59   Vital Signs: Temp: 97.4 F (36.3 C) (01/29 1323) Temp Source: Oral (01/29 1323) BP: 140/60 (01/29 1323) Pulse Rate: 84 (01/29 1323)  Labs: Recent Labs    06/20/21 1026 06/20/21 1028 06/21/21 0222 06/21/21 1230  HGB 12.2* 8.8* 9.0*  --   HCT 36.0* 28.0* 29.1*  --   PLT  --  96* 93*  --   APTT  --  35  --   --   LABPROT  --  16.1*  --   --   INR  --  1.3*  --   --   HEPARINUNFRC  --   --   --  <0.10*  CREATININE 0.80 0.98  --   --      Estimated Creatinine Clearance: 61.7 mL/min (by C-G formula based on SCr of 0.98 mg/dL).  Medications:   Scheduled:   aspirin  300 mg Rectal Daily   Or   aspirin  325 mg Oral Daily   Infusions:   sodium chloride 100 mL/hr (06/20/21 1402)   sodium chloride 100 mL/hr at 06/21/21 0844   heparin 850 Units/hr (06/21/21 0359)   PRN:    Assessment: Stanley Jimenez a 66 y.o. male requires anticoagulation with a heparin iv infusion for the indication of  stroke. Heparin gtt will be started following pharmacy protocol per pharmacy consult. Patient is not on previous oral anticoagulant that will require aPTT/HL correlation before transitioning to only HL monitoring. Heparin level this AM was 0.14 and rate was increased. Repeat HL at noon today was undetectable. Spoke with nurse who indicated patients heparin was running in an awkward position and repeatedly turned off. Nurse has switched location of heparin line and it is now running correctly. Current Hgb ~9 and Plt low 90s. No signs of bleeding noted. Due to inaccurate level with difficult line, will only increase slightly and recheck level to get true value.   Goal of Therapy:  Heparin level 0.3-0.5 units/mL Monitor platelets by  anticoagulation protocol: Yes   Plan:  Increase heparin to 900 units/hr 6-hour heparin level  Monitor CBC and s/sx of bleed  Cephus Slater, PharmD, [place] Resident [phone] 06/21/2021 1:47 PM

## 2021-06-21 NOTE — Evaluation (Signed)
Clinical/Bedside Swallow Evaluation Patient Details  Name: CASIN FEDERICI MRN: [id number] Date of Birth: 05-Feb-1956  Today's Date: 06/21/2021 Time: SLP Start Time (ACUTE ONLY): 5852 SLP Stop Time (ACUTE ONLY): 1300 SLP Time Calculation (min) (ACUTE ONLY): 15 min  Past Medical History:  Past Medical History:  Diagnosis Date   Alcohol abuse, in remission    SINCE 10- 2015   Anxiety    Arthritis    Cirrhosis, alcoholic ([place])    Clubbing of fingers    congenital   Depression    Full dentures    History of PSVT (paroxysmal supraventricular tachycardia)    run of non-sustatined VT 03-10-2015 in setting of alcohol withdrawal in hospital   History of seizure    03-08-2012  alcohol withdrawal   History of thrombocytopenia    10/ 2013  in setting of alcohol withdrawal   Hyperlipidemia    Hypertension    Port-A-Cath in place 02/20/2021   Prostate cancer [facility]) urologist-  dr dalhstedt/  oncologist- dr Tammi Klippel   T1c, Gleason 3+3,  PSA 8.89,  vol 27cc   Psoriasis, 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   Seizures ([place])    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   Past Surgical History:  Past Surgical History:  Procedure Laterality Date   BIOPSY  04/14/2016   Procedure: BIOPSY;  Surgeon: Rogene Houston, MD;  [facility];  Service: Endoscopy;;  esophagus   COLONOSCOPY N/A 04/14/2016   Procedure: COLONOSCOPY;  Surgeon: Rogene Houston, MD;  [facility]: [facility];  Service: Endoscopy;  Laterality: N/A;   ESOPHAGOGASTRODUODENOSCOPY N/A 04/14/2016   Procedure: ESOPHAGOGASTRODUODENOSCOPY (EGD);  Surgeon: Rogene Houston, MD;  [facility]: [facility];  Service: Endoscopy;  Laterality: N/A;  1:00   NO PAST SURGERIES     PORTACATH PLACEMENT Right 05/31/2016   Procedure: INSERTION OF TUNNELED RIGHT INTERNAL JUGULAR BARD POWERPORT CENTRAL VENOUS CATHETER WITH SUBCUTANEOUS PORT;  Surgeon: Vickie Epley, MD;  Location: [facility];  Service: Vascular;  Laterality: Right;   RADIOACTIVE SEED IMPLANT N/A 09/20/2014   Procedure: RADIOACTIVE SEED IMPLANT;  Surgeon: Franchot Gallo, MD;  Location: [facility]  Service: Urology;  Laterality: N/A;    75   seeds implanted no seeds found in bladder   TRANSURETHRAL RESECTION OF PROSTATE     HPI:  Pt is a 66 y/o male admitted secondary to aphasia. MRI revealed multiple bilateral acute infarcts. 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    Assessment / Plan / Recommendation  Clinical Impression  Patient presents with clinical s/s of dysphagia as per this bedside swallow evaluation. He is edentulous which did impact his ability to masticate saltine cracker. No difficulties noted with puree solids. With thin liquid and nectar thick liquid sips (cup and straw), patient exhibited suspected swallow initiation delay but no overt s/s aspiration or penetration. Prolonged transit of bolus observed with regular solids. SLP is recommending to initiate Dys 2 solids and thin liquids diet at this time and will follow patient for toleration as well as upgraded trials of solids. SLP Visit Diagnosis: Dysphagia, unspecified (R13.10)    Aspiration Risk  Mild aspiration risk;Moderate aspiration risk    Diet Recommendation Dysphagia 2 (Fine chop);Thin liquid   Liquid Administration via: Cup;Straw Medication Administration: Whole meds with puree Supervision: Full supervision/cueing for compensatory strategies;Staff to  assist with self feeding;Patient able to self feed Compensations: Minimize environmental distractions;Slow rate;Small sips/bites Postural Changes: Seated upright at 90 degrees    Other  Recommendations Oral Care Recommendations: Oral care BID;Staff/trained caregiver to provide oral care    Recommendations for follow up therapy are one  component of a multi-disciplinary discharge planning process, led by the attending physician.  Recommendations may be updated based on patient status, additional functional criteria and insurance authorization.  Follow up Recommendations Acute inpatient rehab (3hours/day)      Assistance Recommended at Discharge Frequent or constant Supervision/Assistance  Functional Status Assessment Patient has had a recent decline in their functional status and demonstrates the ability to make significant improvements in function in a reasonable and predictable amount of time.  Frequency and Duration min 2x/week  2 weeks       Prognosis Prognosis for Safe Diet Advancement: Good      Swallow Study   General Date of Onset: 06/20/21 HPI: Pt is a 66 y/o male admitted secondary to aphasia. MRI revealed multiple bilateral acute infarcts. 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 Type of Study: Bedside Swallow Evaluation Previous Swallow Assessment: during previous admission 2018 Diet Prior to this Study: NPO Temperature Spikes Noted: No Respiratory Status: Room air History of Recent Intubation: No Behavior/Cognition: Alert;Requires cueing Oral Cavity Assessment: Within Functional Limits Oral Care Completed by SLP: Yes Oral Cavity - Dentition: Edentulous Self-Feeding Abilities: Needs assist;Needs set up;Able to feed self Patient Positioning: Upright in bed Baseline Vocal Quality: Wet Volitional Cough: Cognitively unable to elicit Volitional Swallow: Unable to elicit    Oral/Motor/Sensory Function Overall Oral Motor/Sensory Function: Other (comment) (difficult to fully assess as patient unable to follow majority of commands for oral motor exam)   Ice Chips     Thin Liquid Thin Liquid: Impaired Presentation: Straw;Cup;Self Fed Pharyngeal  Phase Impairments: Suspected  delayed Swallow    Nectar Thick Nectar Thick Liquid: Impaired Presentation: Straw;Cup Pharyngeal Phase Impairments: Suspected delayed Swallow   Honey Thick     Puree Puree: Within functional limits Presentation: Spoon   Solid     Solid: Impaired Oral Phase Impairments: Impaired mastication Oral Phase Functional Implications: Prolonged oral transit;Impaired mastication     Sonia Baller, MA, CCC-SLP Speech Therapy

## 2021-06-22 ENCOUNTER — Inpatient Hospital Stay (HOSPITAL_COMMUNITY): Payer: Medicaid Other

## 2021-06-22 DIAGNOSIS — I639 Cerebral infarction, unspecified: Secondary | ICD-10-CM | POA: Diagnosis not present

## 2021-06-22 LAB — BASIC METABOLIC PANEL
Anion gap: 8 (ref 5–15)
BUN: 12 mg/dL (ref 8–23)
CO2: 19 mmol/L — ABNORMAL LOW (ref 22–32)
Calcium: 9 mg/dL (ref 8.9–10.3)
Chloride: 105 mmol/L (ref 98–111)
Creatinine, Ser: 0.86 mg/dL (ref 0.61–1.24)
GFR, Estimated: >60 mL/min (ref >60)
Glucose, Bld: 104 mg/dL — ABNORMAL HIGH (ref 70–99)
Potassium: 4.8 mmol/L (ref 3.5–5.1)
Sodium: 132 mmol/L — ABNORMAL LOW (ref 135–145)

## 2021-06-22 LAB — CBC
HCT: 27.7 % — ABNORMAL LOW (ref 39.0–52.0)
Hemoglobin: 9 g/dL — ABNORMAL LOW (ref 13.0–17.0)
MCH: 27.6 pg (ref 26.0–34.0)
MCHC: 32.5 g/dL (ref 30.0–36.0)
MCV: 85 fL (ref 80.0–100.0)
Platelets: 84 10*3/uL — ABNORMAL LOW (ref 150–400)
RBC: 3.26 MIL/uL — ABNORMAL LOW (ref 4.22–5.81)
RDW: 23.5 % — ABNORMAL HIGH (ref 11.5–15.5)
WBC: 21.6 10*3/uL — ABNORMAL HIGH (ref 4.0–10.5)
nRBC: 0 % (ref 0.0–0.2)

## 2021-06-22 LAB — HEPARIN LEVEL (UNFRACTIONATED)
Heparin Unfractionated: 0.27 IU/mL — ABNORMAL LOW (ref 0.30–0.70)
Heparin Unfractionated: 0.25 IU/mL — ABNORMAL LOW (ref 0.30–0.70)

## 2021-06-22 LAB — HEMOGLOBIN A1C
Hgb A1c MFr Bld: 6.3 % — ABNORMAL HIGH (ref 4.8–5.6)
Mean Plasma Glucose: 134 mg/dL

## 2021-06-22 MED ORDER — WARFARIN - PHARMACIST DOSING INPATIENT: Freq: Every day | Status: DC

## 2021-06-22 MED ORDER — WARFARIN SODIUM 3 MG PO TABS
3.0000 mg | ORAL_TABLET | Freq: Once | ORAL | Status: AC
  Administered 2021-06-22: 3 mg via ORAL
  Filled 2021-06-22: qty 1

## 2021-06-22 NOTE — Progress Notes (Addendum)
STROKE TEAM PROGRESS NOTE   INTERVAL HISTORY No family at bedside. Patient able to understand and follow some basic commands as well as nod and shake head. Patient largely unchanged from yesterday. WBC 23.9 -> 21.6. TCD bubble study did not show PFO.  Vitals:   06/21/21 2008 06/21/21 2352 06/22/21 0354 06/22/21 0857  BP: 124/65 131/65 125/63 (!) 141/69  Pulse: 88 83 82 81  Resp: 16 16 16 16   Temp: 98.2 F (36.8 C) 98.9 F (37.2 C) 98.1 F (36.7 C) (!) 97.4 F (36.3 C)  TempSrc: Oral  Oral Axillary  SpO2: 100% 100% 100% 100%  Weight:      Height:       CBC:  Recent Labs  Lab 06/16/21 0847 06/20/21 1026 06/20/21 1028 06/21/21 0222 06/22/21 0315  WBC 21.1*  --  24.0* 23.9* 21.6*  NEUTROABS 15.4*  --  20.1*  --   --   HGB 9.1*   < > 8.8* 9.0* 9.0*  HCT 28.0*   < > 28.0* 29.1* 27.7*  MCV 85.9  --  87.2 85.8 85.0  PLT 133*  --  96* 93* 84*   < > = values in this interval not displayed.    Basic Metabolic Panel:  Recent Labs  Lab 06/16/21 0847 06/20/21 1026 06/20/21 1028 06/22/21 0315  NA 137   < > 134* 132*  K 4.0   < > 3.8 4.8  CL 104   < > 104 105  CO2 22  --  24 19*  GLUCOSE 163*   < > 106* 104*  BUN 19   < > 17 12  CREATININE 1.12   < > 0.98 0.86  CALCIUM 9.3  --  9.7 9.0  MG 2.1  --   --   --    < > = values in this interval not displayed.    Lipid Panel:  Recent Labs  Lab 06/21/21 0222  CHOL 143  TRIG 79  HDL 57  CHOLHDL 2.5  VLDL 16  LDLCALC 70    HgbA1c:  Recent Labs  Lab 06/21/21 0222  HGBA1C 6.3*   Urine Drug Screen:  Recent Labs  Lab 06/20/21 1448  LABOPIA POSITIVE*  COCAINSCRNUR NONE DETECTED  LABBENZ NONE DETECTED  AMPHETMU NONE DETECTED  THCU NONE DETECTED  LABBARB NONE DETECTED     Alcohol Level  Recent Labs  Lab 06/20/21 1028  ETH <10     IMAGING past 24 hours No results found.  PHYSICAL EXAM  Physical Exam  Constitutional: Appears well-developed and well-nourished.  Psych: Affect appropriate to  situation Eyes: No scleral injection HENT: No OP obstrucion MSK: no joint deformities.  Cardiovascular: Normal rate and regular rhythm.  Respiratory: Effort normal, non-labored breathing GI: Soft.  No distension. There is no tenderness.  Skin: WDI  Neuro: Mental Status:  Patient is awake, alert, but unclear whether he is oriented. Patient is unable to give coherent history. Receptive and expressive aphasia.   Cranial Nerves: II: Visual Fields are full. Pupils are equal, round, and reactive to light.   III,IV, VI: EOMI without ptosis or diploplia.  V: Facial sensation unable to assess due to noncooperation VII: Facial movement is symmetric resting and smiling VIII: Hearing is intact to voice X: Palate elevates symmetrically XI: Shoulder shrug is symmetric. XII: Tongue protrudes midline without atrophy or fasciculations.  Motor: Tone is normal. Bulk is normal. 5/5 strength was present in all four extremities.  Sensory: Unclear of symmetric sensation due to noncooperation.  Deep Tendon Reflexes: 2+ and symmetric in the biceps and patellae.  Plantars: Toes are downgoing bilaterally.  Coordination: Unable to assess due to noncooperation    ASSESSMENT/PLAN [name] is a 66 y.o. male with history of esophageal cancer currently undergoing chemotherapy, prostate cancer, HLD, PSVT presenting with aphasia.   Stroke: Multiple bilateral acute infarct largest located in Left frontal and occipital lobe likely secondary to embolic source CT head No acute intracranial abnormalities CTA head & neck No LVO, high grade (80%) proximal right internal ICA stenosis  CT perfusion Left-sided 11 cc core and 37 cc penumbra, possible over read from artifact MRI  - multiple bilateral acute infarcts affecting multiple vascular territories  TCD bubble study did not indicate PFO 2D Echo EF 55-60% LDL 70 HgbA1c 6.5 VTE prophylaxis - Heparin    Diet   DIET DYS 2 Room service  appropriate? Yes; Fluid consistency: Thin   No antithrombotic prior to admission, now on heparin IV.  Therapy recommendations:  CIR Disposition:  pending  Hypertension Home meds:  Norvasc  Stable Permissive hypertension (OK if < 220/120) but gradually normalize in 5-7 days Long-term BP goal normotensive   Other Stroke Risk Factors Former Cigarette smoker  Other Active Problems Acute thrombus of right IJ Leucocytosis Esophageal Malignancy Peripheral Artery Disease  Hospital day # 2  France Ravens, MD PGY1 Resident  I have personally obtained history,examined this patient, reviewed notes, independently viewed imaging studies, participated in medical decision making and plan of care.ROS completed by me personally and pertinent positives fully documented  I have made any additions or clarifications directly to the above note. Agree with note above.  Patient presented with by cerebral embolic infarcts likely related to hypercoagulability from esophageal cancer with concurrent right internal jugular vein thrombosis.  TCD bubble study done at the bedside was negative for right-to-left shunt and recommend continue anticoagulation with IV heparin for now but may need to consider switching to warfarin or full dose Lovenox cancer related hypercoagulability at discharge.  Continue ongoing stroke work-up and aggressive risk factor modification.  Discussed with Dr. Pietro Cassis.  Greater than 50% time during this 50-minute visit was spent on counseling and coordination of care and discussion patient and care team involved his embolic strokes evaluation and treatment plan and answering questions.  Antony Contras, MD Medical Director [facility] Pager: [phone] 06/22/2021 3:21 PM    To contact Stroke Continuity provider, please refer to [email or web address] After hours, contact General Neurology

## 2021-06-22 NOTE — Progress Notes (Signed)
PROGRESS NOTE  Stanley Jimenez  DOB: 1956-01-16  PCP: [name], MD UQJ:[id number]  DOA: 06/20/2021  LOS: 2 days  Hospital Day: 3  Chief Complaint  Patient presents with   Altered Mental Status    Brief narrative: EDREES VALENT is a 66 y.o. 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  Patient presented to ED on 1/28 from home for altered mental status. Lives at home alone, at baseline alert, awake, oriented x3.   On 1/28, on the morning of 1/20, he was noted to be having difficulty communicating. Was last seen normal 1 night ago by his brother and sister.  In the ED, patient was afebrile, heart rate in 90s, blood pressure in normal range Labs with sodium 134, creatinine 0.8, WBC count elevated to 24,000, hemoglobin 8.8, platelet 96 Respiratory virus panel negative Blood alcohol level undetectable low CT head normal. MRI brain showed multiple bilateral acute infarcts affecting multiple vascular territories, the largest of which are located in the left frontal operculum and left occipital lobe. 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 CT angio of head and neck did not show any large vessel occlusion but showed high-grade/80% stenosis of the proximal right internal carotid artery, severe stenosis in the proximal left V1 segment and multifocal moderate to severe stenosis in the proximal left V2 segment.  It also showed a suspected thrombus in the right internal jugular vein above the level of the central venous catheter. Extensive osseous metastatic disease. Chest x-ray with known osseous metastasis with multiple lytic lesions of bilateral ribs, LEFT scapula and likely RIGHT proximal humeral. Likely pathologic rib fracture noted in the LEFT posterior sixth rib, RIGHT first rib and RIGHT seventh rib.  Admitted to hospitalist  service Neurology consulted  Subjective: Patient was seen and examined this morning.   Lying on bed.  Not in distress.  On heparin drip.  Speech still remains unclear.    Assessment/Plan: Acute bilateral infarcts -Presented with altered mental status. -MRI brain as above showing multiple acute bilateral infarcts affecting multiple vascular territories including left frontal lobe, occipital lobe -Cardioembolic etiology suspected -CT angio head and neck finding as above. -Neurology consulted -Stroke work-up initiated. -echocardiogram with EF 55 to 60%, no regional wall motion abnormality, grade 1 diastolic dysfunction, normal right ventricular size, function and pulmonary artery systolic pressure. -A1c 6.5, LDL 51 from July 2022 -PT/OT/ST eval  High-grade stenosis of the proximal right intracarotid artery peripheral artery disease -Vascular surgery consult appreciated.  Not a candidate for intervention because of his pre-existing metastatic cancer.  Acute thrombus of right IJ -Per neurology recommendation, heparin drip without bolus started.  Switch to oral anticoagulation when okay with neurology  Leukocytosis -Unclear etiology.  No fever.  No other evidence of infection.  Gradually trending down. Recent Labs  Lab 06/16/21 0847 06/20/21 1028 06/21/21 0222 06/22/21 0315  WBC 21.1* 24.0* 23.9* 21.6*   Essential hypertension -On amlodipine 10 mg daily at home -Permissive hypertension for now -Continue to monitor blood pressure  Malignant tumor of the lower third of esophagus -Currently on chemotherapy  BPH -Flomax 0.4 mg daily  Mobility: PT eval obtained Living condition: Lives at home Goals of care:   Code Status: Full Code  Nutritional status: Body mass index is 18.66 kg/m.      Diet:  Diet Order             DIET  DYS 2 Room service appropriate? Yes; Fluid consistency: Thin  Diet effective now                  DVT prophylaxis: Heparin drip     Antimicrobials: None Fluid: Can stop IV fluid today. Consultants: Neurology Family Communication: None at bedside  Status is: Inpatient  Continue in-hospital care because: Ongoing stroke work-up Level of care: Telemetry Medical   Dispo: The patient is from: Home              Anticipated d/c is to: Hopefully home              Patient currently is not medically stable to d/c.   Difficult to place patient No     Infusions:   heparin 1,100 Units/hr (06/22/21 7209)    Scheduled Meds:  aspirin  300 mg Rectal Daily   Or   aspirin  325 mg Oral Daily   Chlorhexidine Gluconate Cloth  6 each Topical Daily    PRN meds: acetaminophen **OR** acetaminophen (TYLENOL) oral liquid 160 mg/5 mL **OR** acetaminophen, HYDROmorphone (DILAUDID) injection, senna-docusate   Antimicrobials: Anti-infectives (From admission, onward)    None       Objective: Vitals:   06/22/21 0354 06/22/21 0857  BP: 125/63 (!) 141/69  Pulse: 82 81  Resp: 16 16  Temp: 98.1 F (36.7 C) (!) 97.4 F (36.3 C)  SpO2: 100% 100%    Intake/Output Summary (Last 24 hours) at 06/22/2021 1024 Last data filed at 06/22/2021 0344 Gross per 24 hour  Intake 1905.61 ml  Output 1150 ml  Net 755.61 ml   Filed Weights   06/20/21 0956 06/20/21 2223  Weight: 59 kg 56.5 kg   Weight change:  Body mass index is 18.66 kg/m.   Physical Exam: General exam: Pleasant, African-American male.  Looks older for his age.  Not in physical distress skin: No rashes, lesions or ulcers. HEENT: Atraumatic, normocephalic, no obvious bleeding Lungs: Clear to auscultation bilaterally.  Left anterior chest wall with chemotherapy port. CVS: Regular rate and rhythm, no murmur GI/Abd soft, nontender, nondistended, also present CNS: Alert, awake, able to follow motor commands.  [place]umbles.  Psychiatry: Sad affect Extremities: No pedal edema, no calf tenderness  Data Review: I have personally reviewed the laboratory data and studies  available.  F/u labs ordered Unresulted Labs (From admission, onward)     Start     Ordered   06/23/21 0500  Heparin level (unfractionated)  Daily,   R     Question:  Specimen collection method  Answer:  Lab=Lab collect   06/22/21 0457   06/22/21 1600  Heparin level (unfractionated)  Once-Timed,   TIMED       Question:  Specimen collection method  Answer:  Lab=Lab collect   06/22/21 0914   06/21/21 0500  Hemoglobin A1c  (Labs)  Tomorrow morning,   R        06/20/21 2249   06/21/21 0500  CBC  Daily,   R     Question:  Specimen collection method  Answer:  Lab=Lab collect   06/21/21 0004            Signed, Terrilee Croak, MD [facility] 06/22/2021

## 2021-06-22 NOTE — Progress Notes (Signed)
Physical Therapy Treatment Patient Details Name: Stanley Jimenez MRN: [id number] DOB: [birth year] Today's Date: 06/22/2021   History of Present Illness Pt is a 66 y/o male admitted secondary to aphasia. MRI revealed multiple bilateral acute infarcts. 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    PT Comments    Pt still needing moderate or more multimodal cues to be able to follow direction and stay on task.  Emphasis on transition, sit to stand safety, balance (BERG) activity at EOB, progression of gait stability, quality and standing exercises.  Stress on calling out simple 1-step commands and cuing only if pt unable to understand and needing other multimodal cues.    Recommendations for follow up therapy are one component of a multi-disciplinary discharge planning process, led by the attending physician.  Recommendations may be updated based on patient status, additional functional criteria and insurance authorization.  Follow Up Recommendations  Acute inpatient rehab (3hours/day)     Assistance Recommended at Discharge Frequent or constant Supervision/Assistance  Patient can return home with the following A lot of help with walking and/or transfers;A lot of help with bathing/dressing/bathroom;Assistance with cooking/housework;Direct supervision/assist for medications management;Direct supervision/assist for financial management;Assist for transportation;Help with stairs or ramp for entrance   Equipment Recommendations  Other (comment) (TBD)    Recommendations for Other Services Rehab consult     Precautions / Restrictions Precautions Precautions: Fall     Mobility  Bed Mobility Overal bed mobility: Needs Assistance       Supine to sit: Supervision Sit to supine: Min guard   General bed mobility comments: multimodal cues along with VC to get  pt to initiate and understand the task being asked of him    Transfers Overall transfer level: Needs assistance   Transfers: Sit to/from Stand Sit to Stand: Min assist           General transfer comment: cues and stability assist.    Ambulation/Gait Ambulation/Gait assistance: Min assist Gait Distance (Feet): 70 Feet (then 80 feet and another 60 feet, working on following direction within and between tasks) Assistive device: IV Pole Gait Pattern/deviations: Step-through pattern   Gait velocity interpretation: <1.8 ft/sec, indicate of risk for recurrent falls   General Gait Details: small tentative steps with work on heel/toe and slightly widening his steps.  Emphasis on giving frequent 1 step commands with follow up cues when pt unable to follow.   Stairs             Wheelchair Mobility    Modified Rankin (Stroke Patients Only) Modified Rankin (Stroke Patients Only) Pre-Morbid Rankin Score: No symptoms Modified Rankin: Moderately severe disability     Balance Overall balance assessment: Needs assistance Sitting-balance support: Feet supported Sitting balance-Leahy Scale: Fair     Standing balance support: No upper extremity supported, During functional activity Standing balance-Leahy Scale: Fair Standing balance comment: pt able to do some of the less complex BERG activity standing at EOB to work on balance and following simple commands with/without cuing.                            Cognition Arousal/Alertness: Awake/alert Behavior During Therapy: Flat affect Overall Cognitive Status: No family/caregiver present to determine baseline cognitive functioning Area of Impairment: Following commands, Safety/judgement, Problem solving  Following Commands: Follows one step commands inconsistently, Follows one step commands with increased time Safety/Judgement: Decreased awareness of safety, Decreased awareness of  deficits   Problem Solving: Slow processing, Decreased initiation, Difficulty sequencing, Requires verbal cues          Exercises General Exercises - Lower Extremity Hip ABduction/ADduction: AROM, AAROM, Strengthening, Both, 10 reps, Standing Hip Flexion/Marching: AROM, AAROM, Strengthening, 10 reps, Standing, Both Heel Raises: AROM, Both, 10 reps, Standing    General Comments General comments (skin integrity, edema, etc.): vss on RW      Pertinent Vitals/Pain Pain Assessment Pain Assessment: Faces Faces Pain Scale: No hurt Pain Intervention(s): Monitored during session    Home Living                          Prior Function            PT Goals (current goals can now be found in the care plan section) Acute Rehab PT Goals Patient Stated Goal: unable to state PT Goal Formulation: Patient unable to participate in goal setting Time For Goal Achievement: 07/05/21 Potential to Achieve Goals: Good Progress towards PT goals: Progressing toward goals    Frequency    Min 4X/week      PT Plan Current plan remains appropriate    Co-evaluation              AM-PAC PT "6 Clicks" Mobility   Outcome Measure  Help needed turning from your back to your side while in a flat bed without using bedrails?: A Little Help needed moving from lying on your back to sitting on the side of a flat bed without using bedrails?: A Little Help needed moving to and from a bed to a chair (including a wheelchair)?: A Lot Help needed standing up from a chair using your arms (e.g., wheelchair or bedside chair)?: A Little Help needed to walk in hospital room?: A Lot Help needed climbing 3-5 steps with a railing? : A Lot 6 Click Score: 15    End of Session   Activity Tolerance: Patient tolerated treatment well Patient left: in bed;with call bell/phone within reach;with bed alarm set Nurse Communication: Mobility status PT Visit Diagnosis: Other abnormalities of gait and  mobility (R26.89)     Time: 9211-9417 PT Time Calculation (min) (ACUTE ONLY): 20 min  Charges:  $Gait Training: 8-22 mins                     06/22/2021  Ginger Carne., PT Acute Rehabilitation Services [phone]  (pager) [phone]  (office)   Stanley Jimenez 06/22/2021, 3:57 PM

## 2021-06-22 NOTE — Progress Notes (Signed)
Speech Language Pathology Treatment: Dysphagia;Cognitive-Linquistic  Patient Details Name: Stanley Jimenez MRN: [id number] DOB: May 08, 1956 Today's Date: 06/22/2021 Time: 0881-1031 SLP Time Calculation (min) (ACUTE ONLY): 20 min  Assessment / Plan / Recommendation Clinical Impression  Pt seen at bedside for follow up after BSE/SLE completed 06/21/21. Pt was awake, resting in bed upon arrival of SLP. No family present. Pt accepted trials of puree and thin liquid. No overt s/s aspiration observed following any presentation. Will continue dys2/thin liquid diet. Safe swallow precautions posted at [facility].   Pt continues to have fluent verbalizations with unintelligible speech. He was able to follow 1-step directions with 75% accuracy with repetition of instruction given intermittently. Yes/No responses are not consistent, and therefore unreliable. Pt was able to verbalize "1", but was unable to continue counting with visual and verbal cues. Pt appeared to have difficulty limiting his verbalizations with counting task. Speech continues to be fully unintelligible, and pt appears to exhibit awareness and frustration of this fact. SLP will continue to follow acutely to facilitate effective communication of wants/needs. Continued speech therapy at next venue of care is also recommended.    HPI HPI: Pt is a 66 y/o male admitted secondary to aphasia. MRI revealed multiple bilateral acute infarcts. 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      SLP Plan  Continue with current plan of care      Recommendations for follow up therapy are one component of a multi-disciplinary discharge planning process, led by the attending physician.  Recommendations may be updated based on patient status, additional functional criteria and insurance authorization.    Recommendations  Diet  recommendations: Dysphagia 2 (fine chop);Thin liquid Liquids provided via: Cup;Straw Medication Administration: Whole meds with puree Supervision: Staff to assist with self feeding;Full supervision/cueing for compensatory strategies Compensations: Minimize environmental distractions;Slow rate;Small sips/bites Postural Changes and/or Swallow Maneuvers: Seated upright 90 degrees                Oral Care Recommendations: Oral care BID;Staff/trained caregiver to provide oral care Follow Up Recommendations: Acute inpatient rehab (3hours/day) Assistance recommended at discharge: Frequent or constant Supervision/Assistance SLP Visit Diagnosis: Aphasia (R47.01) Plan: Continue with current plan of care          Shakelia Scrivner B. Quentin Ore, [facility], [place] Speech Language Pathologist Office: [phone]  Shonna Chock  06/22/2021, 12:25 PM

## 2021-06-22 NOTE — Progress Notes (Addendum)
[facility] for Warfarin and heparin Indication: RIJ DVT   No Known Allergies  Patient Measurements: Height: 5' 8.5" (174 cm) Weight: 56.5 kg (124 lb 9 oz) IBW/kg (Calculated) : 69.55 Heparin Dosing Weight: HEPARIN DW (KG): 59   Vital Signs: Temp: 97.4 F (36.3 C) (01/30 1638) Temp Source: Oral (01/30 1638) BP: 114/63 (01/30 1638) Pulse Rate: 84 (01/30 1638)  Labs: Recent Labs    06/20/21 1026 06/20/21 1028 06/21/21 0222 06/21/21 1230 06/21/21 2007 06/22/21 0315 06/22/21 0800 06/22/21 1629  HGB 12.2* 8.8* 9.0*  --   --  9.0*  --   --   HCT 36.0* 28.0* 29.1*  --   --  27.7*  --   --   PLT  --  96* 93*  --   --  84*  --   --   APTT  --  35  --   --   --   --   --   --   LABPROT  --  16.1*  --   --   --   --   --   --   INR  --  1.3*  --   --   --   --   --   --   HEPARINUNFRC  --   --  0.14*   < > 0.14*  --  0.27* 0.25*  CREATININE 0.80 0.98  --   --   --  0.86  --   --    < > = values in this interval not displayed.    Estimated Creatinine Clearance: 70.3 mL/min (by C-G formula based on SCr of 0.86 mg/dL).     Assessment: Stanley Jimenez is a 66 y.o. 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 No anticoagulation prior to admission. INR on admission was 1.3. Pharmacy consulted for warfarin. Patient has been on heparin infusion since 1/28.   H/H, plt stable. Note patient is on aspirin 325 mg for stroke and PTA was on megestrol for cancer (currently held). AST slightly elevated, only eating 25%.   Heparin level 0.25 is subtherapeutic and decreased slightly despite increasing to 1100 units/hr.  No issues with infusion or bleeding per RN.  Goal of Therapy:  INR 2- 3 Heparin level 0.3-0.5 units/mL Monitor platelets by anticoagulation protocol: Yes   Plan:  Start warfarin 3 mg x1 Increase heparin to 1200 units/hr F/u 8 hr heparin level   Monitor  daily INR, heparin level, CBC Monitor for signs/symptoms of bleeding    Benetta Spar, PharmD, BCPS, BCCP Clinical Pharmacist  Please check AMION for all [facility] phone numbers After 10:00 PM, call [place]

## 2021-06-22 NOTE — Progress Notes (Signed)
ANTICOAGULATION CONSULT NOTE   Pharmacy Consult for heparin gtt  Indication: stroke  No Known Allergies  Patient Measurements: Height: 5' 8.5" (174 cm) Weight: 56.5 kg (124 lb 9 oz) IBW/kg (Calculated) : 69.55 Heparin Dosing Weight: HEPARIN DW (KG): 59   Vital Signs: Temp: 97.4 F (36.3 C) (01/30 0857) Temp Source: Axillary (01/30 0857) BP: 141/69 (01/30 0857) Pulse Rate: 81 (01/30 0857)  Labs: Recent Labs    06/20/21 1026 06/20/21 1028 06/20/21 1028 06/21/21 0222 06/21/21 1230 06/21/21 2007 06/22/21 0315 06/22/21 0800  HGB 12.2* 8.8*  --  9.0*  --   --  9.0*  --   HCT 36.0* 28.0*  --  29.1*  --   --  27.7*  --   PLT  --  96*  --  93*  --   --  84*  --   APTT  --  35  --   --   --   --   --   --   LABPROT  --  16.1*  --   --   --   --   --   --   INR  --  1.3*  --   --   --   --   --   --   HEPARINUNFRC  --   --    < > 0.14* <0.10* 0.14*  --  0.27*  CREATININE 0.80 0.98  --   --   --   --  0.86  --    < > = values in this interval not displayed.     Estimated Creatinine Clearance: 70.3 mL/min (by C-G formula based on SCr of 0.86 mg/dL).  Medications:   Scheduled:   aspirin  300 mg Rectal Daily   Or   aspirin  325 mg Oral Daily   Chlorhexidine Gluconate Cloth  6 each Topical Daily   Infusions:   sodium chloride 100 mL/hr (06/20/21 1402)   sodium chloride 100 mL/hr at 06/22/21 0344   heparin 1,000 Units/hr (06/22/21 0043)   PRN:    Assessment: Stanley Jimenez is a 66 y.o. male on anticoagulation with IV heparin for the indication of  stroke. Patient is not on previous oral anticoagulant.   Heparin level 0.27 on 1000 units/hr.  No bleeding or complications noted.  Goal of Therapy:  Heparin level 0.3-0.5 units/mL Monitor platelets by anticoagulation protocol: Yes   Plan:  Increase heparin to 1100 units/hr Check heparin level in 6 hours Monitor CBC and s/sx of bleed   [employer], Pharm.D., BCPS Clinical Pharmacist Clinical phone for  06/22/2021 from 7:30-3:00 is [phone].  **Pharmacist phone directory can be found on [email or web address] listed under [place].  06/22/2021 9:13 AM

## 2021-06-22 NOTE — Progress Notes (Addendum)
°  ° ° °  Subjective  - No acute distress follow commands.  Assessment/Planning: metastatic esophageal cancer which he is currently undergoing treatment via right IJ port.  CVA with aphasia MRI revealed  Multiple bilateral acute infarcts affecting multiple vascular territories, the largest of which are located in the left frontal operculum and left occipital lobe. 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  We have been consulted in regards to Carotid stenosis with CT revealing  asymptomatic right ICA High-grade (approximally 80%), left ICA approximately 30-40% stenosis.   Per Dr. Claretha Cooper review of the CT there is an IJ thrombus but no intra atrial shunts to suggest this as a source.  Given his underlying malignancy unsure that he would be a candidate to treat his asymptomatic lesion. He is currently being managed on Full dose Heparin anticoagulation.     He is moving al extremities without weakness noted.  No change in aphasia. He has known  left SFA occlusion without ischemic skin changes on the left LE.  Feet warm well perfused with intact motor.   Objective 125/63 82 98.1 F (36.7 C) (Oral) 16 100%  Intake/Output Summary (Last 24 hours) at 06/22/2021 0826 Last data filed at 06/22/2021 0344 Gross per 24 hour  Intake 1905.61 ml  Output 1150 ml  Net 755.61 ml    Moving all extremities.  Follows commands grunts yes/no understanding Lungs non labored breathing Heart RRR Abdomin soft NTTP     Roxy Horseman 06/22/2021 8:26 AM --  Laboratory Lab Results: Recent Labs    06/21/21 0222 06/22/21 0315  WBC 23.9* 21.6*  HGB 9.0* 9.0*  HCT 29.1* 27.7*  PLT 93* 84*   BMET Recent Labs    06/20/21 1028 06/22/21 0315  NA 134* 132*  K 3.8 4.8  CL 104 105  CO2 24 19*  GLUCOSE 106* 104*  BUN 17 12  CREATININE 0.98 0.86  CALCIUM 9.7 9.0    COAG Lab Results  Component Value Date   INR 1.3 (H) 06/20/2021   INR 1.2 02/06/2021   INR 1.05 09/13/2014    No results found for: PTT  I have independently interviewed and examined patient and agree with PA assessment and plan above. High grade stenosis R ICA appears asymptomatic. Unlikely a good candidate to treat an asymptomatic carotid stenosis given underlying metastatic malignancy. 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   Jackelyne Sayer C. Donzetta Matters, MD [facility] Office: [phone] Pager: [phone]

## 2021-06-22 NOTE — Progress Notes (Signed)
Inpatient Rehab Admissions Coordinator:   I met with pt. And spoke to his brother, [name], regarding potential CIR admit. [name] states that he is interested and that he can provide 24/7 support at discharge. I will open a case for insurance auth and pursue for potential admit.   Clemens Catholic, [place], [facility] Admissions Coordinator  [phone] ([place]) [phone] (office)

## 2021-06-22 NOTE — Progress Notes (Signed)
TCD w/ bubble study completed.   Please see CV Proc for preliminary results.   Ubaldo Daywalt, RDMS, RVT  

## 2021-06-23 LAB — HEPARIN LEVEL (UNFRACTIONATED)
Heparin Unfractionated: 0.35 IU/mL (ref 0.30–0.70)
Heparin Unfractionated: 0.45 IU/mL (ref 0.30–0.70)

## 2021-06-23 LAB — CBC
HCT: 25.2 % — ABNORMAL LOW (ref 39.0–52.0)
Hemoglobin: 8.5 g/dL — ABNORMAL LOW (ref 13.0–17.0)
MCH: 28.1 pg (ref 26.0–34.0)
MCHC: 33.7 g/dL (ref 30.0–36.0)
MCV: 83.4 fL (ref 80.0–100.0)
Platelets: 89 10*3/uL — ABNORMAL LOW (ref 150–400)
RBC: 3.02 MIL/uL — ABNORMAL LOW (ref 4.22–5.81)
RDW: 23.1 % — ABNORMAL HIGH (ref 11.5–15.5)
WBC: 21.1 10*3/uL — ABNORMAL HIGH (ref 4.0–10.5)
nRBC: 0 % (ref 0.0–0.2)

## 2021-06-23 LAB — PROTIME-INR
INR: 1.2 (ref 0.8–1.2)
Prothrombin Time: 14.9 seconds (ref 11.4–15.2)

## 2021-06-23 MED ORDER — WARFARIN SODIUM 3 MG PO TABS
3.0000 mg | ORAL_TABLET | Freq: Once | ORAL | Status: DC
  Filled 2021-06-23: qty 1

## 2021-06-23 NOTE — Progress Notes (Signed)
[facility] for Warfarin and heparin Indication: RIJ DVT   No Known Allergies  Patient Measurements: Height: 5' 8.5" (174 cm) Weight: 56.5 kg (124 lb 9 oz) IBW/kg (Calculated) : 69.55 Heparin Dosing Weight: HEPARIN DW (KG): 59   Vital Signs: Temp: 98.8 F (37.1 C) (01/30 2313) Temp Source: Oral (01/30 2313) BP: 126/59 (01/30 2313) Pulse Rate: 90 (01/30 2313)  Labs: Recent Labs    06/20/21 1026 06/20/21 1026 06/20/21 1028 06/21/21 0222 06/21/21 1230 06/22/21 0315 06/22/21 0800 06/22/21 1629 06/23/21 0034  HGB 12.2*  --  8.8* 9.0*  --  9.0*  --   --  8.5*  HCT 36.0*  --  28.0* 29.1*  --  27.7*  --   --  25.2*  PLT  --    < > 96* 93*  --  84*  --   --  89*  APTT  --   --  35  --   --   --   --   --   --   LABPROT  --   --  16.1*  --   --   --   --   --  14.9  INR  --   --  1.3*  --   --   --   --   --  1.2  HEPARINUNFRC  --   --   --  0.14*   < >  --  0.27* 0.25* 0.35  CREATININE 0.80  --  0.98  --   --  0.86  --   --   --    < > = values in this interval not displayed.     Estimated Creatinine Clearance: 70.3 mL/min (by C-G formula based on SCr of 0.86 mg/dL).     Assessment: Stanley Jimenez is a 66 y.o. 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 No anticoagulation prior to admission. INR on admission was 1.3. Pharmacy consulted for warfarin. Patient has been on heparin infusion since 1/28.   H/H, plt stable. Note patient is on aspirin 325 mg for stroke and PTA was on megestrol for cancer (currently held). AST slightly elevated, only eating 25%.   Heparin level 0.25 is subtherapeutic and decreased slightly despite increasing to 1100 units/hr.  No issues with infusion or bleeding per RN.  1/31 AM update:  Heparin level therapeutic   Goal of Therapy:  INR 2- 3 Heparin level 0.3-0.5 units/mL Monitor platelets by anticoagulation protocol: Yes    Plan:  Cont heparin at 1200 units/hr 1100 heparin level    Narda Bonds, PharmD, BCPS Clinical Pharmacist Phone: [phone]

## 2021-06-23 NOTE — Progress Notes (Signed)
PROGRESS NOTE  Stanley Jimenez  DOB: [birth year]  PCP: [name], MD KKX:[id number]  DOA: 06/20/2021  LOS: 3 days  Hospital Day: 4  Chief Complaint  Patient presents with   Altered Mental Status    Brief narrative: Stanley Jimenez is a 66 y.o. 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  Patient presented to ED on 1/28 from home for altered mental status. Lives at home alone, at baseline alert, awake, oriented x3.   On 1/28, on the morning of 1/20, he was noted to be having difficulty communicating. Was last seen normal 1 night ago by his brother and sister.  In the ED, patient was afebrile, heart rate in 90s, blood pressure in normal range Labs with sodium 134, creatinine 0.8, WBC count elevated to 24,000, hemoglobin 8.8, platelet 96 Respiratory virus panel negative Blood alcohol level undetectable low CT head normal. MRI brain showed multiple bilateral acute infarcts affecting multiple vascular territories, the largest of which are located in the left frontal operculum and left occipital lobe. 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 CT angio of head and neck did not show any large vessel occlusion but showed high-grade/80% stenosis of the proximal right internal carotid artery, severe stenosis in the proximal left V1 segment and multifocal moderate to severe stenosis in the proximal left V2 segment.  It also showed a suspected thrombus in the right internal jugular vein above the level of the central venous catheter. Extensive osseous metastatic disease. Chest x-ray with known osseous metastasis with multiple lytic lesions of bilateral ribs, LEFT scapula and likely RIGHT proximal humeral. Likely pathologic rib fracture noted in the LEFT posterior sixth rib, RIGHT first rib and RIGHT seventh rib.  Admitted to hospitalist  service Neurology consulted  Subjective: Patient was seen and examined this morning.   Lying on bed.  Not in distress.  Continues to have slurred speech.  On heparin drip and Coumadin.  Assessment/Plan: Acute bilateral infarcts -Presented with altered mental status. -MRI brain as above showing multiple acute bilateral infarcts affecting multiple vascular territories including left frontal lobe, occipital lobe -Cardioembolic etiology suspected -CT angio head and neck finding as above. -Neurology consulted -Stroke work-up initiated. -echocardiogram with EF 55 to 60%, no regional wall motion abnormality, grade 1 diastolic dysfunction, normal right ventricular size, function and pulmonary artery systolic pressure. -A1c 6.5, LDL 51 from July 2022 -PT/OT/ST eval -Anticoagulation plan as below.  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  Not a candidate for intervention because of his pre-existing metastatic cancer.  Acute thrombus of right IJ -Per neurology recommendation, heparin drip was started.  Also started on Coumadin on 1/30.  Leukocytosis -Unclear etiology.  No fever.  No other evidence of infection.  Gradually trending down. Recent Labs  Lab 06/20/21 1028 06/21/21 0222 06/22/21 0315 06/23/21 0034  WBC 24.0* 23.9* 21.6* 21.1*    Essential hypertension -On amlodipine 10 mg daily at home -Permissive hypertension for now -Continue to monitor blood pressure  Malignant tumor of the lower third of esophagus -Follows up with Dr. Delton Coombes as an outpatient.  Last visit was on 06/16/2021.  Checked with him on 06/23/2021, okay to postpone chemotherapy until patient comes out of rehab.  BPH -Flomax 0.4 mg daily  Mobility: PT eval obtained Living condition: Lives at home Goals of care:   Code Status: Full Code  Nutritional status: Body mass index is 18.66 kg/m.      Diet:  Diet Order              DIET DYS 2 Room service appropriate? Yes; Fluid consistency: Thin  Diet effective now                  DVT prophylaxis: Heparin drip    Antimicrobials: None Fluid: None IV fluid Consultants: Neurology Family Communication: None at bedside  Status is: Inpatient  Continue in-hospital care because: Pending CIR. Level of care: Telemetry Medical   Dispo: The patient is from: Home              Anticipated d/c is to: CIR              Patient currently is medically stable to d/c.   Difficult to place patient No     Infusions:   heparin 1,200 Units/hr (06/23/21 0034)    Scheduled Meds:  aspirin  300 mg Rectal Daily   Or   aspirin  325 mg Oral Daily   Chlorhexidine Gluconate Cloth  6 each Topical Daily   Warfarin - Pharmacist Dosing Inpatient   Does not apply q1600    PRN meds: acetaminophen **OR** acetaminophen (TYLENOL) oral liquid 160 mg/5 mL **OR** acetaminophen, HYDROmorphone (DILAUDID) injection, senna-docusate   Antimicrobials: Anti-infectives (From admission, onward)    None       Objective: Vitals:   06/23/21 0350 06/23/21 0901  BP: 139/73 140/72  Pulse: 83 87  Resp: 18 20  Temp: 98.7 F (37.1 C) (!) 97.5 F (36.4 C)  SpO2: 100% 100%    Intake/Output Summary (Last 24 hours) at 06/23/2021 1134 Last data filed at 06/23/2021 0034 Gross per 24 hour  Intake 199.33 ml  Output 600 ml  Net -400.67 ml    Filed Weights   06/20/21 0956 06/20/21 2223  Weight: 59 kg 56.5 kg   Weight change:  Body mass index is 18.66 kg/m.   Physical Exam: General exam: Pleasant, African-American male.  Looks older for his age.  Not in pain skin: No rashes, lesions or ulcers. HEENT: Atraumatic, normocephalic, no obvious bleeding Lungs: Clear to auscultation bilaterally.  Left anterior chest wall with chemotherapy port. CVS: Regular rate and rhythm, no murmur GI/Abd soft, nontender, nondistended, also present CNS: Alert, awake, able to follow motor  commands.  Continues to have slurred speech Psychiatry: Sad affect Extremities: No pedal edema, no calf tenderness  Data Review: I have personally reviewed the laboratory data and studies available.  F/u labs ordered Unresulted Labs (From admission, onward)     Start     Ordered   06/24/21 0500  Heparin level (unfractionated)  Daily,   R     Question:  Specimen collection method  Answer:  Lab=Lab collect   06/22/21 1722   06/23/21 0500  Protime-INR  Daily,   R     Question:  Specimen collection method  Answer:  Lab=Lab collect   06/22/21 1711   06/21/21 0500  CBC  Daily,   R     Question:  Specimen collection method  Answer:  Lab=Lab collect   06/21/21 0004            Signed, Terrilee Croak, MD [facility] 06/23/2021

## 2021-06-23 NOTE — PMR Pre-admission (Signed)
PMR Admission Coordinator Pre-Admission Assessment °  °Patient: Jaxen M Mcshea is an 65 y.o., male °MRN: [id number] °DOB: 09/23/1955 °Height: 5' 8.5" (174 cm) °Weight: 56.5 kg °  °Insurance Information °HMO:     PPO:      PCP:      IPA:      80/20:      OTHER:  °PRIMARY: [employer]      Policy#: [id number]      Subscriber: Pt °CM Name:    Callie    Phone#: [phone]     Fax#: [phone] Callie at [employer] called and stated Pt. Approved from 1/31-2/6 with updates due 2/6 °Pre-Cert#: [id number]      Employer:  °Benefits:  Phone #:      Name: Phone: Online - [email or web address] °Eff Date: 11/21/2020 - 11/20/2021 °Deductible: $0 (does not have deductible) °OOP Max: $0 °CIR: 100% coverage °SNF: 100% coverage, first 90 days covered by [employer] Medicaid then members move to FFS °Outpatient: 100% coverage 27 visits per calendar year across all therapy disciplines combined °Home Health:  100% coverage Home Health Aides services must be limited to 100 total visits per year per member °DME: 100% coverage, limited by medical necessity °Providers: in network . ° °SECONDARY:       Policy#:      Phone#:  °  °  °The “Data Collection Information Summary” for patients in Inpatient Rehabilitation Facilities with attached “Privacy Act Statement-Health Care Records” was provided and verbally reviewed with: Patient °  °Emergency Contact Information °Contact Information   °  °  Name Relation Home Work Mobile  °  Devereux,Leon Brother [phone]      °  Lawson,Debra Sister [phone]      °  Cervenka,DONNIE Brother     [phone]  °  °   °  °  °Current Medical History  °Patient Admitting Diagnosis: CVA °History of Present Illness: Jasten M. Johns is a 65-year-old right-handed male with history of paroxysmal SVT, thrombocytopenia, hyperlipidemia, prostate cancer with radioactive seed implant 2016, 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  Per chart review patient lives alone he has a brother with good support.  Presented 06/20/2021 with acute onset of aphasia.  Cranial CT scan negative for acute changes.  CT angiogram head and neck no emergent large vessel occlusion in the head.  High-grade approximately 80% in the proximal right ICA, severe stenosis proximal left V1 segment multifocal moderate to severe stenosis of the proximal left V2 segment.  Mixed plaque in the proximal left ICA resulting in approximate 30 to 40% stenosis.  Suspect thrombus in the right internal jugular vein above the level of the central venous catheter.  Extensive osseous metastatic disease.  MRI of the brain multiple bilateral acute infarcts affecting multiple vascular territories the largest which located the left frontal operculum and left occipital lobe.  TCD bubble study did not indicate PFO.  Echocardiogram with ejection fraction of 55 to 60% no wall motion abnormalities grade 1 diastolic dysfunction.  Admission chemistries unremarkable except sodium 134, WBC 24,000, platelets 96,000, hemoglobin 8.8, ammonia level 24, urine drug screen positive opiates..  Vascular surgery Dr. Brandon Cain consulted in regards to high-grade stenosis right ICA patient asymptomatic not a good candidate for surgical intervention given underlying metastatic malignancy.  Initially placed on IV heparin and transitioning to Coumadin however patient developed hematuria and currently maintained on aspirin therapy awaiting plan in regards to   anticoagulation.  Hemoglobin remained stable 9.1 monitoring of leukocytosis improved from 24,000 and 18,100 and latest platelet count improved to 119,000.  Currently on a dysphagia #2 thin liquid diet.  Therapy evaluations completed due to patient decreased functional mobility and aphasia was admitted for a comprehensive rehab program. °  °  ° Complete NIHSS TOTAL: 6 °  °Patient's medical record from [facility]  has been  reviewed by the rehabilitation admission coordinator and physician. °  °Past Medical History  °    °Past Medical History:  °Diagnosis Date  ° Alcohol abuse, in remission    °  SINCE 10- 2015  ° Anxiety    ° Arthritis    ° Cirrhosis, alcoholic (HCC)    ° Clubbing of fingers    °  congenital  ° Depression    ° Full dentures    ° History of PSVT (paroxysmal supraventricular tachycardia)    °  run of non-sustatined VT 03-10-2015 in setting of alcohol withdrawal in hospital  ° History of seizure    °  03-08-2012  alcohol withdrawal  ° History of thrombocytopenia    °  10/ 2013  in setting of alcohol withdrawal  ° Hyperlipidemia    ° Hypertension    ° Port-A-Cath in place 02/20/2021  ° Prostate cancer (HCC) urologist-  dr dalhstedt/  oncologist- dr manning  °  T1c, Gleason 3+3,  PSA 8.89,  vol 27cc  ° Psoriasis, 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 Yes °  °Family History   °family history includes Alcohol abuse in his mother; Diabetes in his sister. °  °Current Medications °  °Current Facility-Administered Medications:  °  acetaminophen (TYLENOL) tablet 650 mg, 650 mg, Oral, Q4H PRN **OR** acetaminophen (TYLENOL) 160 MG/5ML solution 650 mg, 650 mg, Per Tube, Q4H PRN **OR** acetaminophen (TYLENOL) suppository 650 mg, 650 mg, Rectal, Q4H PRN, Stinson, Jacob J, DO °  aspirin suppository 300 mg, 300 mg, Rectal, Daily, 300 mg at 06/21/21 1121 **OR** aspirin tablet 325 mg, 325 mg, Oral, Daily, Stinson, Jacob J, DO, 325 mg at 06/23/21 1107 °  Chlorhexidine Gluconate Cloth 2 % PADS 6 each, 6 each, Topical, Daily, Dahal, Binaya, MD, 6 each at 06/22/21 1029 °  heparin ADULT infusion 100 units/mL (25000 units/250mL), 1,200 Units/hr, Intravenous, Continuous, Chen, Lydia D, RPH, Last Rate: 12 mL/hr at 06/23/21 0034, 1,200 Units/hr at  06/23/21 0034 °  HYDROmorphone (DILAUDID) injection 1 mg, 1 mg, Intravenous, Q3H PRN, Stinson, Jacob J, DO, 1 mg at 06/22/21 1233 °  senna-docusate (Senokot-S) tablet 1 tablet, 1 tablet, Oral, QHS PRN, Stinson, Jacob J, DO °  warfarin (COUMADIN) tablet 3 mg, 3 mg, Oral, ONCE-1600, Hammons, Kimberly B, RPH °  Warfarin - Pharmacist Dosing Inpatient, , Does not apply, q1600, Chen, Lydia D, RPH, Given at 06/22/21 1833 °  °Patients Current Diet:  °Diet Order   °  °         °    DIET DYS 2 Room service appropriate? Yes; Fluid consistency: Thin  Diet effective now       °  °  °   °  °  °   °  °  °Precautions / Restrictions °Precautions °Precautions: Fall °Precaution Comments: blood in urine °Restrictions °  Weight Bearing Restrictions: No  °  °Has the patient had 2 or more falls or a fall with injury in the past year? Yes °  °Prior Activity Level °Community (5-7x/wk): Pt. was active in the community PTA °  °Prior Functional Level °Self Care: Did the patient need help bathing, dressing, using the toilet or eating? Independent °  °Indoor Mobility: Did the patient need assistance with walking from room to room (with or without device)? Independent °  °Stairs: Did the patient need assistance with internal or external stairs (with or without device)? Independent °  °Functional Cognition: Did the patient need help planning regular tasks such as shopping or remembering to take medications? Independent °  °Patient Information °Are you of Hispanic, Latino/a,or Spanish origin?: A. No, not of Hispanic, Latino/a, or Spanish origin °What is your race?: B. Black or African American °Do you need or want an interpreter to communicate with a doctor or health care staff?: 0. No °  °Patient's Response To:  °Health Literacy and Transportation °Is the patient able to respond to health literacy and transportation needs?: No °Health Literacy - How often do you need to have someone help you when you read instructions, pamphlets, or other written  material from your doctor or pharmacy?: Patient unable to respond °In the past 12 months, has lack of transportation kept you from medical appointments or from getting medications?: No °In the past 12 months, has lack of transportation kept you from meetings, work, or from getting things needed for daily living?: No °  °Home Assistive Devices / Equipment °Home Assistive Devices/Equipment: None °  °Prior Device Use: Indicate devices/aids used by the patient prior to current illness, exacerbation or injury? None of the above °  °Current Functional Level °Cognition °  Arousal/Alertness: Awake/alert °Overall Cognitive Status: Difficult to assess °Difficult to assess due to: Impaired communication °Current Attention Level: Sustained °Orientation Level: Oriented to person, Oriented to place °Following Commands: Follows one step commands inconsistently, Follows one step commands with increased time °Safety/Judgement: Decreased awareness of safety, Decreased awareness of deficits °General Comments: pt scooted all the way down in chair, unable to understand scooting butt back in chair to allow for the leg rest to come down to safely stand up, pt continues with word finding difficulty, easily distracted °Attention: Sustained °Sustained Attention: Impaired °Sustained Attention Impairment: Functional basic, Verbal basic °Memory:  (unable to assess due to severe deficits in communication and cognition) °Awareness: Impaired °Awareness Impairment: Intellectual impairment °Problem Solving: Impaired °Problem Solving Impairment: Functional basic °   °Extremity Assessment °(includes Sensation/Coordination) °  Upper Extremity Assessment: Overall WFL for tasks assessed  °Lower Extremity Assessment: Defer to PT evaluation  °   °ADLs °  Overall ADL's : Needs assistance/impaired °Eating/Feeding: NPO °Grooming: Minimal assistance, Sitting °Upper Body Bathing: Minimal assistance, Sitting °Lower Body Bathing: Moderate assistance, Sit to/from  stand °Upper Body Dressing : Minimal assistance, Sitting °Lower Body Dressing: Moderate assistance, Sit to/from stand °Lower Body Dressing Details (indicate cue type and reason): pt able to doff/don socks with increased time and cues °Toilet Transfer: Minimal assistance, Rolling walker (2 wheels) °Toilet Transfer Details (indicate cue type and reason): assist for verbal cues. no physical cues required. used RW, however likely to due just as well without it °Toileting- Clothing Manipulation and Hygiene: Maximal assistance, Cueing for safety °Toileting - Clothing Manipulation Details (indicate cue type and reason): pt had BM in bed upon arrival, he required max A for hygiene in standing. Attempted by A and initiation of task   however pt not sustaining °Functional mobility during ADLs: Minimal assistance, Rolling walker (2 wheels) °General ADL Comments: pt is moving well, continues to be limited by impulsivity and impaired communication.  °   °Mobility °  Overal bed mobility: Needs Assistance °Bed Mobility: Supine to Sit °Supine to sit: Supervision °Sit to supine: Min guard °General bed mobility comments: pt received up in chair  °   °Transfers °  Overall transfer level: Needs assistance °Equipment used: None °Transfers: Sit to/from Stand °Sit to Stand: Min guard °General transfer comment: min guard for safety, unsteady at first and provided pt with HHA on the R  °   °Ambulation / Gait / Stairs / Wheelchair Mobility °  Ambulation/Gait °Ambulation/Gait assistance: Min assist °Gait Distance (Feet): 120 Feet °Assistive device: Rolling walker (2 wheels) °Gait Pattern/deviations: Step-through pattern, Drifts right/left °General Gait Details: pt easily distracted vearing L and R with RW not looking where he is going, verbal cues to not push to far out in front, asked for pt to navigate back to his room however pt unable to complete, unsure if it was a deficit in comprehension or focus, attempted to amb without AD, pt reaching  for objects to hold onto, very unsteady and wider base of support °Gait velocity: dec °Gait velocity interpretation: <1.8 ft/sec, indicate of risk for recurrent falls  °   °Posture / Balance Balance °Overall balance assessment: Needs assistance °Sitting-balance support: Feet supported °Sitting balance-Leahy Scale: Fair °Standing balance support: Single extremity supported, During functional activity °Standing balance-Leahy Scale: Fair °Standing balance comment: pt able to do some of the less complex BERG activity standing at EOB to work on balance and following simple commands with/without cuing.  °   °Special needs/care consideration Skin intact  °  °Previous Home Environment (from acute therapy documentation) °Living Arrangements: Alone ° Lives With: Alone °Available Help at Discharge: Family °Type of Home: House °Home Layout: One level °Home Access: Stairs to enter °Entrance Stairs-Rails: Right °Entrance Stairs-Number of Steps: 2 °Bathroom Shower/Tub: Tub/shower unit °Bathroom Toilet: Standard °Bathroom Accessibility: Yes °How Accessible: Accessible via walker °Home Care Services: No °Additional Comments: pt with aphasia and unable to provide any reliable information; no family/caregivers present throughout evaluation °  °Discharge Living Setting °Plans for Discharge Living Setting: House °Type of Home at Discharge: House °Discharge Home Layout: One level °Discharge Home Access: Stairs to enter °Entrance Stairs-Rails: Right °Entrance Stairs-Number of Steps: 2 °Discharge Bathroom Shower/Tub: Tub/shower unit °Discharge Bathroom Toilet: Standard °Discharge Bathroom Accessibility: Yes °How Accessible: Accessible via walker °Does the patient have any problems obtaining your medications?: No °  °Social/Family/Support Systems °Patient Roles: Other (Comment) °Contact Information: [phone] °Anticipated Caregiver: Leon (brother) °Ability/Limitations of Caregiver: Can provide Min A °Caregiver Availability:  24/7 °Discharge Plan Discussed with Primary Caregiver: Yes °Is Caregiver In Agreement with Plan?: Yes °Does Caregiver/Family have Issues with Lodging/Transportation while Pt is in Rehab?: Yes °  °Goals °Patient/Family Goal for Rehab: PT/OT Supervision, SLP Mod A °Expected length of stay: 7-10 days °Pt/Family Agrees to Admission and willing to participate: Yes °Program Orientation Provided & Reviewed with Pt/Caregiver Including Roles  & Responsibilities: Yes °  °Decrease burden of Care through IP rehab admission: Specialzed equipment needs, Decrease number of caregivers, Bowel and bladder program, and Patient/family education °  °Possible need for SNF placement upon discharge: not anticipated  °  °Patient Condition: I have reviewed medical records from [facility] , spoken with CM, and patient. I met with patient at the bedside for inpatient rehabilitation assessment.    is currently min A with mobility and basic ADLs.  Discharge setting and therapy post discharge at home with home health is anticipated.  Patient has agreed to participate in the Acute Inpatient Rehabilitation Program and will admit today.  Preadmission Screen Completed By:  Genella Mech, 06/23/2021 3:02 PM ______________________________________________________________________   Discussed status with Dr. Ranell Patrick  on 06/23/21 at 1511 and received approval for admission today.  Admission Coordinator:  Genella Mech,  CCC-SLP, time 1511/Date 06/23/21   Assessment/Plan: Diagnosis: Ischemic stroke of frontal lobe Does the need for close, 24 hr/day Medical supervision in concert with the patient's rehab needs make it unreasonable for this patient to be served in a less intensive setting? Yes Co-Morbidities requiring supervision/potential complications: essential hypertension, malignant tumor of lower third of esophagus, PAD, acute thrombosis of right internal jugular vein, leukocytosis Due to bladder management, bowel management, safety, skin/wound care, disease management, medication administration, pain management, and patient education, does the patient require 24 hr/day rehab nursing? Yes Does the patient require coordinated care of a physician, rehab nurse, PT, OT, and SLP to address physical and functional deficits in the context of the above medical diagnosis(es)? Yes Addressing deficits in the following areas: balance, endurance, locomotion, strength, transferring, bowel/bladder control, bathing, dressing, feeding, grooming, toileting, cognition, speech, language, swallowing, and psychosocial support Can the patient actively participate in an intensive therapy program of at least 3 hrs of therapy 5 days a week? Yes The potential for patient to make measurable gains while on inpatient rehab is excellent Anticipated functional outcomes upon discharge from inpatient rehab: supervision PT, supervision OT, supervision SLP Estimated rehab length of stay to reach the above functional goals is: 5-7 days Anticipated discharge destination: Home 10. Overall Rehab/Functional Prognosis: excellent   MD Signature: Leeroy Cha, MD

## 2021-06-23 NOTE — Progress Notes (Signed)
[place] for warfarin and heparin Indication: RIJ DVT, stroke   No Known Allergies  Patient Measurements: Height: 5' 8.5" (174 cm) Weight: 56.5 kg (124 lb 9 oz) IBW/kg (Calculated) : 69.55 Heparin Dosing Weight: HEPARIN DW (KG): 59   Vital Signs: Temp: 97.5 F (36.4 C) (01/31 0901) Temp Source: Oral (01/31 0901) BP: 140/72 (01/31 0901) Pulse Rate: 87 (01/31 0901)  Labs: Recent Labs    06/21/21 0222 06/21/21 1230 06/22/21 0315 06/22/21 0800 06/22/21 1629 06/23/21 0034 06/23/21 1025  HGB 9.0*  --  9.0*  --   --  8.5*  --   HCT 29.1*  --  27.7*  --   --  25.2*  --   PLT 93*  --  84*  --   --  89*  --   LABPROT  --   --   --   --   --  14.9  --   INR  --   --   --   --   --  1.2  --   HEPARINUNFRC 0.14*   < >  --    < > 0.25* 0.35 0.45  CREATININE  --   --  0.86  --   --   --   --    < > = values in this interval not displayed.     Estimated Creatinine Clearance: 70.3 mL/min (by C-G formula based on SCr of 0.86 mg/dL).     Assessment: Stanley Jimenez is a 66 y.o. 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 No anticoagulation prior to admission. INR on admission was 1.3. Pharmacy consulted for warfarin. Patient has been on heparin infusion since 1/28.   H/H, plt stable. Note patient is on aspirin 325 mg for stroke and PTA was on megestrol for cancer (currently held). AST slightly elevated, only eating 25%.   Heparin level 0.45 is therapeutic on heparin at 1200 units/hr.  No issues with infusion or bleeding per RN.  INR subtherapeutic as expected after first dose of warfarin.  Goal of Therapy:  INR 2- 3 Heparin level 0.3-0.5 units/mL Monitor platelets by anticoagulation protocol: Yes   Plan:  Repeat  warfarin 3 mg x1 Ccontinue heparin at 1200 units/hr Monitor daily INR, heparin level, and CBC Monitor for signs/symptoms of bleeding   [employer], Pharm.D., BCPS Clinical Pharmacist Clinical phone for 06/23/2021 from 7:30-3:00 is [phone].  **Pharmacist phone directory can be found on [email or web address] listed under [place].  06/23/2021 11:53 AM

## 2021-06-23 NOTE — Progress Notes (Signed)
Physical Therapy Treatment Patient Details Name: Stanley Jimenez MRN: [id number] DOB: 1956-01-31 Today's Date: 06/23/2021   History of Present Illness Pt is a 66 y/o male admitted secondary to aphasia. MRI revealed multiple bilateral acute infarcts. 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    PT Comments    Pt continues to demo both cognitive and functional deficits. Pt with continued word finding difficulties, delayed processing, impaired comprehension, and decreased insight to safety and deficits. Pt continues to require assist for all mobility. Continue to recommend AIR upon d/c for maximal functional recovery to decrease burden on family. Acute PT to cont to follow.    Recommendations for follow up therapy are one component of a multi-disciplinary discharge planning process, led by the attending physician.  Recommendations may be updated based on patient status, additional functional criteria and insurance authorization.  Follow Up Recommendations  Acute inpatient rehab (3hours/day)     Assistance Recommended at Discharge Frequent or constant Supervision/Assistance  Patient can return home with the following A lot of help with walking and/or transfers;A lot of help with bathing/dressing/bathroom;Assistance with cooking/housework;Direct supervision/assist for medications management;Direct supervision/assist for financial management;Assist for transportation;Help with stairs or ramp for entrance   Equipment Recommendations  Other (comment) (TBD)    Recommendations for Other Services Rehab consult     Precautions / Restrictions Precautions Precautions: Fall Precaution Comments: blood in urine Restrictions Weight Bearing Restrictions: No     Mobility  Bed Mobility               General bed mobility comments: pt received up in chair     Transfers Overall transfer level: Needs assistance Equipment used: None Transfers: Sit to/from Stand Sit to Stand: Min guard           General transfer comment: min guard for safety, unsteady at first and provided pt with HHA on the R    Ambulation/Gait Ambulation/Gait assistance: Min assist Gait Distance (Feet): 120 Feet Assistive device: Rolling walker (2 wheels) Gait Pattern/deviations: Step-through pattern, Drifts right/left Gait velocity: dec Gait velocity interpretation: <1.8 ft/sec, indicate of risk for recurrent falls   General Gait Details: pt easily distracted vearing L and R with RW not looking where he is going, verbal cues to not push to far out in front, asked for pt to navigate back to his room however pt unable to complete, unsure if it was a deficit in comprehension or focus, attempted to amb without AD, pt reaching for objects to hold onto, very unsteady and wider base of support   Stairs             Wheelchair Mobility    Modified Rankin (Stroke Patients Only) Modified Rankin (Stroke Patients Only) Pre-Morbid Rankin Score: No symptoms Modified Rankin: Moderately severe disability     Balance Overall balance assessment: Needs assistance Sitting-balance support: Feet supported Sitting balance-Leahy Scale: Fair     Standing balance support: Single extremity supported, During functional activity Standing balance-Leahy Scale: Fair                              Cognition Arousal/Alertness: Awake/alert Behavior During Therapy: Flat affect Overall Cognitive Status: Difficult to assess Area of Impairment: Following commands, Safety/judgement, Awareness, Attention                   Current Attention Level: Sustained  Following Commands: Follows one step commands inconsistently, Follows one step commands with increased time Safety/Judgement: Decreased awareness of safety, Decreased awareness of deficits Awareness:  Emergent Problem Solving: Slow processing, Decreased initiation, Difficulty sequencing, Requires verbal cues General Comments: pt scooted all the way down in chair, unable to understand scooting butt back in chair to allow for the leg rest to come down to safely stand up, pt continues with word finding difficulty, easily distracted        Exercises General Exercises - Lower Extremity Long Arc Quad: AROM, Both, 10 reps, Seated Hip Flexion/Marching: AROM, AAROM, Strengthening, 10 reps, Standing, Both    General Comments General comments (skin integrity, edema, etc.): RN present and aware of blood in urine      Pertinent Vitals/Pain Pain Assessment Pain Assessment: Faces Faces Pain Scale: No hurt    Home Living                          Prior Function            PT Goals (current goals can now be found in the care plan section) Acute Rehab PT Goals Patient Stated Goal: unable to state PT Goal Formulation: Patient unable to participate in goal setting Time For Goal Achievement: 07/05/21 Potential to Achieve Goals: Good Progress towards PT goals: Progressing toward goals    Frequency    Min 4X/week      PT Plan Current plan remains appropriate    Co-evaluation              AM-PAC PT "6 Clicks" Mobility   Outcome Measure  Help needed turning from your back to your side while in a flat bed without using bedrails?: A Little Help needed moving from lying on your back to sitting on the side of a flat bed without using bedrails?: A Little Help needed moving to and from a bed to a chair (including a wheelchair)?: A Lot Help needed standing up from a chair using your arms (e.g., wheelchair or bedside chair)?: A Little Help needed to walk in hospital room?: A Lot Help needed climbing 3-5 steps with a railing? : A Lot 6 Click Score: 15    End of Session Equipment Utilized During Treatment: Gait belt Activity Tolerance: Patient tolerated treatment  well Patient left: with call bell/phone within reach;in chair;with chair alarm set Nurse Communication: Mobility status PT Visit Diagnosis: Other abnormalities of gait and mobility (R26.89)     Time: 4235-3614 PT Time Calculation (min) (ACUTE ONLY): 13 min  Charges:  $Gait Training: 8-22 mins                     Kittie Plater, PT, DPT Acute Rehabilitation Services Pager #: [phone] Office #: [phone]    Berline Lopes 06/23/2021, 2:09 PM

## 2021-06-23 NOTE — Progress Notes (Signed)
Occupational Therapy Treatment Patient Details Name: Stanley Jimenez MRN: [id number] DOB: [birth year] Today's Date: 06/23/2021   History of present illness Pt is a 66 y/o male admitted secondary to aphasia. MRI revealed multiple bilateral acute infarcts. 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   OT comments  Stanley Jimenez is progressing well but continues to be limited by poor insight, impulsivity, impaired cognition, communication coordination and balance. Upon arrival, pt was soiled with BM, seemingly unaware and required max A in standing for hygiene for initiation, sequencing and sustaining task. He ambulated with min A verbal cues for safety and RW. D/c recommendation remains appropriate, OT to continue to follow acutely.   Recommendations for follow up therapy are one component of a multi-disciplinary discharge planning process, led by the attending physician.  Recommendations may be updated based on patient status, additional functional criteria and insurance authorization.    Follow Up Recommendations  Acute inpatient rehab (3hours/day)    Assistance Recommended at Discharge Frequent or constant Supervision/Assistance  Patient can return home with the following  A lot of help with walking and/or transfers;A lot of help with bathing/dressing/bathroom;Direct supervision/assist for medications management;Assist for transportation;Help with stairs or ramp for entrance   Equipment Recommendations  Other (comment)    Recommendations for Other Services Rehab consult    Precautions / Restrictions Precautions Precautions: Fall Restrictions Weight Bearing Restrictions: No       Mobility Bed Mobility Overal bed mobility: Needs Assistance Bed Mobility: Supine to Sit     Supine to sit: Supervision     General bed mobility comments: cues     Transfers Overall transfer level: Needs assistance Equipment used: Rolling walker (2 wheels) Transfers: Sit to/from Stand Sit to Stand: Supervision           General transfer comment: cues for safety/impulsivity     Balance Overall balance assessment: Needs assistance Sitting-balance support: Feet supported Sitting balance-Leahy Scale: Fair     Standing balance support: Single extremity supported, During functional activity Standing balance-Leahy Scale: Fair         ADL either performed or assessed with clinical judgement   ADL Overall ADL's : Needs assistance/impaired           Toilet Transfer: Minimal assistance;Rolling walker (2 wheels) Toilet Transfer Details (indicate cue type and reason): assist for verbal cues. no physical cues required. used RW, however likely to due just as well without it Writer and Hygiene: Maximal assistance;Cueing for safety Toileting - Clothing Manipulation Details (indicate cue type and reason): pt had BM in bed upon arrival, he required max A for hygiene in standing. Attempted by A and initiation of task however pt not sustaining     Functional mobility during ADLs: Minimal assistance;Rolling walker (2 wheels) General ADL Comments: pt is moving well, continues to be limited by impulsivity and impaired communication.    Extremity/Trunk Assessment Upper Extremity Assessment Upper Extremity Assessment: Overall WFL for tasks assessed   Lower Extremity Assessment Lower Extremity Assessment: Defer to PT evaluation        Vision   Vision Assessment?: No apparent visual deficits   Perception Perception Perception: Not tested   Praxis Praxis Praxis: Not tested    Cognition Arousal/Alertness: Awake/alert Behavior During Therapy: Flat affect Overall Cognitive Status: Difficult to assess Area of Impairment: Following commands, Safety/judgement, Awareness, Attention  Current  Attention Level: Selective   Following Commands: Follows one step commands consistently Safety/Judgement: Decreased awareness of safety, Decreased awareness of deficits Awareness: Emergent Problem Solving: Slow processing, Decreased initiation, Difficulty sequencing, Requires verbal cues General Comments: continues to require cues for safe sequencing. impulsive throughout. soiled upon arrrival & seemingly unaware              General Comments VSS on RA. Pt urinated in condom cath during session, bright red blood noted again after. RN noted, again.    Pertinent Vitals/ Pain       Pain Assessment Pain Assessment: Faces Faces Pain Scale: Hurts little more Pain Location: penis after urination Pain Descriptors / Indicators: Grimacing, Guarding Pain Intervention(s): Limited activity within patient's tolerance, Monitored during session   Frequency  Min 2X/week        Progress Toward Goals  OT Goals(current goals can now be found in the care plan section)  Progress towards OT goals: Progressing toward goals  Acute Rehab OT Goals Patient Stated Goal: unable to state OT Goal Formulation: With patient Time For Goal Achievement: 07/05/21 Potential to Achieve Goals: Good ADL Goals Pt Will Perform Grooming: with modified independence;standing Pt Will Perform Lower Body Bathing: with modified independence;sit to/from stand Pt Will Perform Lower Body Dressing: with modified independence;sit to/from stand Pt Will Transfer to Toilet: with modified independence;ambulating  Plan Discharge plan remains appropriate       AM-PAC OT "6 Clicks" Daily Activity     Outcome Measure   Help from another person eating meals?: None Help from another person taking care of personal grooming?: A Little Help from another person toileting, which includes using toliet, bedpan, or urinal?: A Little Help from another person bathing (including washing, rinsing, drying)?: A Lot Help from another person  to put on and taking off regular upper body clothing?: A Little Help from another person to put on and taking off regular lower body clothing?: A Lot 6 Click Score: 17    End of Session Equipment Utilized During Treatment: Gait belt;Rolling walker (2 wheels)  OT Visit Diagnosis: Other abnormalities of gait and mobility (R26.89);Unsteadiness on feet (R26.81);Muscle weakness (generalized) (M62.81);Pain;Cognitive communication deficit (R41.841) Symptoms and signs involving cognitive functions: Cerebral infarction   Activity Tolerance Patient tolerated treatment well   Patient Left in chair;with chair alarm set;with call bell/phone within reach   Nurse Communication Mobility status;Other (comment)        Time: 1203 (1203)-1225 OT Time Calculation (min): 22 min  Charges: OT General Charges $OT Visit: 1 Visit OT Treatments $Self Care/Home Management : 8-22 mins  Stanley Jimenez 06/23/2021, 1:20 PM

## 2021-06-23 NOTE — Progress Notes (Addendum)
STROKE TEAM PROGRESS NOTE   INTERVAL HISTORY No family at bedside. Patient able to understand and follow some basic commands as well as nod and shake head. Patient largely unchanged from yesterday. WBC 21.6 -> 21.1. Patient started heparin-warfarin bridge.  Vital signs are stable.  Neurological exam is unchanged. Vitals:   06/22/21 2313 06/23/21 0350 06/23/21 0901 06/23/21 1222  BP: (!) 126/59 139/73 140/72 (!) 143/80  Pulse: 90 83 87 97  Resp: 16 18 20 17   Temp: 98.8 F (37.1 C) 98.7 F (37.1 C) (!) 97.5 F (36.4 C) 98.1 F (36.7 C)  TempSrc: Oral Oral Oral Oral  SpO2: 98% 100% 100% 100%  Weight:      Height:       CBC:  Recent Labs  Lab 06/20/21 1028 06/21/21 0222 06/22/21 0315 06/23/21 0034  WBC 24.0*   < > 21.6* 21.1*  NEUTROABS 20.1*  --   --   --   HGB 8.8*   < > 9.0* 8.5*  HCT 28.0*   < > 27.7* 25.2*  MCV 87.2   < > 85.0 83.4  PLT 96*   < > 84* 89*   < > = values in this interval not displayed.    Basic Metabolic Panel:  Recent Labs  Lab 06/20/21 1028 06/22/21 0315  NA 134* 132*  K 3.8 4.8  CL 104 105  CO2 24 19*  GLUCOSE 106* 104*  BUN 17 12  CREATININE 0.98 0.86  CALCIUM 9.7 9.0    Lipid Panel:  Recent Labs  Lab 06/21/21 0222  CHOL 143  TRIG 79  HDL 57  CHOLHDL 2.5  VLDL 16  LDLCALC 70    HgbA1c:  Recent Labs  Lab 06/21/21 0222  HGBA1C 6.3*    Urine Drug Screen:  Recent Labs  Lab 06/20/21 1448  LABOPIA POSITIVE*  COCAINSCRNUR NONE DETECTED  LABBENZ NONE DETECTED  AMPHETMU NONE DETECTED  THCU NONE DETECTED  LABBARB NONE DETECTED     Alcohol Level  Recent Labs  Lab 06/20/21 1028  ETH <10     IMAGING past 24 hours No results found.  PHYSICAL EXAM  Physical Exam  Constitutional: Appears well-developed and well-nourished.  Psych: Affect appropriate to situation Eyes: No scleral injection HENT: No OP obstrucion MSK: no joint deformities.  Cardiovascular: Normal rate and regular rhythm.  Respiratory: Effort  normal, non-labored breathing GI: Soft.  No distension. There is no tenderness.  Skin: WDI  Neuro: Mental Status:  Patient is awake, alert, but unclear whether he is oriented. Patient is unable to give coherent history. Receptive and expressive aphasia.   Cranial Nerves: II: Visual Fields are full. Pupils are equal, round, and reactive to light.   III,IV, VI: EOMI without ptosis or diploplia.  V: Facial sensation unable to assess due to noncooperation VII: Facial movement is symmetric resting and smiling VIII: Hearing is intact to voice X: Palate elevates symmetrically XI: Shoulder shrug is symmetric. XII: Tongue protrudes midline without atrophy or fasciculations.  Motor: Tone is normal. Bulk is normal. 5/5 strength was present in all four extremities.  Sensory: Unclear of symmetric sensation due to noncooperation.  Deep Tendon Reflexes: 2+ and symmetric in the biceps and patellae.  Plantars: Toes are downgoing bilaterally.  Coordination: Unable to assess due to noncooperation    ASSESSMENT/PLAN [name] is a 66 y.o. male with history of esophageal cancer currently undergoing chemotherapy, prostate cancer, HLD, PSVT presenting with aphasia.   Stroke: Multiple bilateral acute infarct largest located  in Left frontal and occipital lobe likely secondary to embolic source versus hypercoagulability from esophageal cancer CT head No acute intracranial abnormalities CTA head & neck No LVO, high grade (80%) proximal right internal ICA stenosis  CT perfusion Left-sided 11 cc core and 37 cc penumbra, possible over read from artifact MRI  - multiple bilateral acute infarcts affecting multiple vascular territories  TCD bubble study did not indicate PFO 2D Echo EF 55-60% LDL 70 HgbA1c 6.5 VTE prophylaxis - Heparin    Diet   DIET DYS 2 Room service appropriate? Yes; Fluid consistency: Thin   No antithrombotic prior to admission, now on heparin IV.  Therapy  recommendations:  CIR Disposition:  pending  Hypertension Home meds:  Norvasc  Stable Permissive hypertension (OK if < 220/120) but gradually normalize in 5-7 days Long-term BP goal normotensive   Other Stroke Risk Factors Former Cigarette smoker  Other Active Problems Acute thrombus of right IJ Leucocytosis Esophageal Malignancy Peripheral Artery Disease  Hospital day # 3  France Ravens, MD PGY1 Resident  I have personally obtained history,examined this patient, reviewed notes, independently viewed imaging studies, participated in medical decision making and plan of care.ROS completed by me personally and pertinent positives fully documented  I have made any additions or clarifications directly to the above note. Agree with note above.  Patient presented with by cerebral embolic infarcts likely related to hypercoagulability from esophageal cancer with concurrent right internal jugular vein thrombosis.  Recommend warfarin for anticoagulation with IV heparin bridge till INR is optimal.  Stroke team will sign off.  Kindly call for questions.  Greater than 50% time during this 25-minute visit was spent on counseling and coordination of care and discussion with patient and care team and answering questions.  Antony Contras, MD Medical Director [facility] Pager: [phone] 06/23/2021 3:13 PM    To contact Stroke Continuity provider, please refer to [email or web address] After hours, contact General Neurology

## 2021-06-23 NOTE — Progress Notes (Signed)
Inpatient Rehab Admissions Coordinator:   I do not have insurance auth for CIR this morning. I will continue to follow for potential admit once I receive insurance auth.  Clemens Catholic, [place], [place] Admissions Coordinator  [phone] ([place]) [phone] (office)

## 2021-06-23 NOTE — Plan of Care (Signed)
°  Problem: Education: Goal: Knowledge of General Education information will improve Description: Including pain rating scale, medication(s)/side effects and non-pharmacologic comfort measures Outcome: Progressing   Problem: Education: Goal: Knowledge of disease or condition will improve Outcome: Progressing Goal: Knowledge of secondary prevention will improve (SELECT ALL) Outcome: Progressing

## 2021-06-24 ENCOUNTER — Inpatient Hospital Stay (HOSPITAL_COMMUNITY)
Admission: RE | Admit: 2021-06-24 | Discharge: 2021-07-07 | DRG: 057 | Disposition: A | Discharge status: Home Health | Payer: Medicaid Other | Source: Intra-hospital | Attending: Physical Medicine & Rehabilitation | Admitting: Physical Medicine & Rehabilitation

## 2021-06-24 ENCOUNTER — Inpatient Hospital Stay (HOSPITAL_COMMUNITY): Payer: Medicaid Other

## 2021-06-24 ENCOUNTER — Other Ambulatory Visit: Payer: Self-pay

## 2021-06-24 DIAGNOSIS — C787 Secondary malignant neoplasm of liver and intrahepatic bile duct: Secondary | ICD-10-CM | POA: Diagnosis present

## 2021-06-24 DIAGNOSIS — Z8546 Personal history of malignant neoplasm of prostate: Secondary | ICD-10-CM | POA: Diagnosis not present

## 2021-06-24 DIAGNOSIS — Z7189 Other specified counseling: Secondary | ICD-10-CM | POA: Diagnosis not present

## 2021-06-24 DIAGNOSIS — D696 Thrombocytopenia, unspecified: Secondary | ICD-10-CM | POA: Diagnosis present

## 2021-06-24 DIAGNOSIS — D649 Anemia, unspecified: Secondary | ICD-10-CM | POA: Diagnosis present

## 2021-06-24 DIAGNOSIS — I6340 Cerebral infarction due to embolism of unspecified cerebral artery: Secondary | ICD-10-CM | POA: Diagnosis not present

## 2021-06-24 DIAGNOSIS — E785 Hyperlipidemia, unspecified: Secondary | ICD-10-CM | POA: Diagnosis present

## 2021-06-24 DIAGNOSIS — C7951 Secondary malignant neoplasm of bone: Secondary | ICD-10-CM | POA: Diagnosis not present

## 2021-06-24 DIAGNOSIS — Z833 Family history of diabetes mellitus: Secondary | ICD-10-CM

## 2021-06-24 DIAGNOSIS — B370 Candidal stomatitis: Secondary | ICD-10-CM | POA: Diagnosis not present

## 2021-06-24 DIAGNOSIS — I82C11 Acute embolism and thrombosis of right internal jugular vein: Secondary | ICD-10-CM | POA: Diagnosis present

## 2021-06-24 DIAGNOSIS — Z923 Personal history of irradiation: Secondary | ICD-10-CM | POA: Diagnosis not present

## 2021-06-24 DIAGNOSIS — M19041 Primary osteoarthritis, right hand: Secondary | ICD-10-CM | POA: Diagnosis present

## 2021-06-24 DIAGNOSIS — Z79899 Other long term (current) drug therapy: Secondary | ICD-10-CM

## 2021-06-24 DIAGNOSIS — R1310 Dysphagia, unspecified: Secondary | ICD-10-CM | POA: Diagnosis present

## 2021-06-24 DIAGNOSIS — Z9079 Acquired absence of other genital organ(s): Secondary | ICD-10-CM

## 2021-06-24 DIAGNOSIS — I69398 Other sequelae of cerebral infarction: Secondary | ICD-10-CM | POA: Diagnosis not present

## 2021-06-24 DIAGNOSIS — I6310 Cerebral infarction due to embolism of unspecified precerebral artery: Secondary | ICD-10-CM

## 2021-06-24 DIAGNOSIS — I6521 Occlusion and stenosis of right carotid artery: Secondary | ICD-10-CM | POA: Diagnosis present

## 2021-06-24 DIAGNOSIS — R109 Unspecified abdominal pain: Secondary | ICD-10-CM

## 2021-06-24 DIAGNOSIS — M47812 Spondylosis without myelopathy or radiculopathy, cervical region: Secondary | ICD-10-CM | POA: Diagnosis not present

## 2021-06-24 DIAGNOSIS — I471 Supraventricular tachycardia: Secondary | ICD-10-CM | POA: Diagnosis present

## 2021-06-24 DIAGNOSIS — F1011 Alcohol abuse, in remission: Secondary | ICD-10-CM | POA: Diagnosis present

## 2021-06-24 DIAGNOSIS — I69320 Aphasia following cerebral infarction: Secondary | ICD-10-CM | POA: Diagnosis not present

## 2021-06-24 DIAGNOSIS — I10 Essential (primary) hypertension: Secondary | ICD-10-CM | POA: Diagnosis present

## 2021-06-24 DIAGNOSIS — Z7901 Long term (current) use of anticoagulants: Secondary | ICD-10-CM

## 2021-06-24 DIAGNOSIS — M542 Cervicalgia: Secondary | ICD-10-CM | POA: Diagnosis not present

## 2021-06-24 DIAGNOSIS — C801 Malignant (primary) neoplasm, unspecified: Secondary | ICD-10-CM

## 2021-06-24 DIAGNOSIS — M19042 Primary osteoarthritis, left hand: Secondary | ICD-10-CM | POA: Diagnosis present

## 2021-06-24 DIAGNOSIS — I959 Hypotension, unspecified: Secondary | ICD-10-CM | POA: Diagnosis not present

## 2021-06-24 DIAGNOSIS — Z811 Family history of alcohol abuse and dependence: Secondary | ICD-10-CM | POA: Diagnosis not present

## 2021-06-24 DIAGNOSIS — C155 Malignant neoplasm of lower third of esophagus: Secondary | ICD-10-CM | POA: Diagnosis present

## 2021-06-24 DIAGNOSIS — K5900 Constipation, unspecified: Secondary | ICD-10-CM | POA: Diagnosis not present

## 2021-06-24 DIAGNOSIS — I69351 Hemiplegia and hemiparesis following cerebral infarction affecting right dominant side: Principal | ICD-10-CM

## 2021-06-24 DIAGNOSIS — R1030 Lower abdominal pain, unspecified: Secondary | ICD-10-CM | POA: Diagnosis not present

## 2021-06-24 DIAGNOSIS — I69322 Dysarthria following cerebral infarction: Secondary | ICD-10-CM | POA: Diagnosis not present

## 2021-06-24 DIAGNOSIS — R7303 Prediabetes: Secondary | ICD-10-CM | POA: Diagnosis present

## 2021-06-24 DIAGNOSIS — I63419 Cerebral infarction due to embolism of unspecified middle cerebral artery: Secondary | ICD-10-CM

## 2021-06-24 DIAGNOSIS — H539 Unspecified visual disturbance: Secondary | ICD-10-CM | POA: Diagnosis present

## 2021-06-24 DIAGNOSIS — R319 Hematuria, unspecified: Secondary | ICD-10-CM

## 2021-06-24 DIAGNOSIS — K7030 Alcoholic cirrhosis of liver without ascites: Secondary | ICD-10-CM | POA: Diagnosis present

## 2021-06-24 DIAGNOSIS — I69391 Dysphagia following cerebral infarction: Secondary | ICD-10-CM | POA: Diagnosis not present

## 2021-06-24 DIAGNOSIS — D492 Neoplasm of unspecified behavior of bone, soft tissue, and skin: Secondary | ICD-10-CM | POA: Diagnosis not present

## 2021-06-24 DIAGNOSIS — D72829 Elevated white blood cell count, unspecified: Secondary | ICD-10-CM | POA: Diagnosis present

## 2021-06-24 DIAGNOSIS — C412 Malignant neoplasm of vertebral column: Secondary | ICD-10-CM | POA: Diagnosis not present

## 2021-06-24 DIAGNOSIS — Z87891 Personal history of nicotine dependence: Secondary | ICD-10-CM

## 2021-06-24 DIAGNOSIS — I63312 Cerebral infarction due to thrombosis of left middle cerebral artery: Secondary | ICD-10-CM | POA: Diagnosis not present

## 2021-06-24 DIAGNOSIS — N200 Calculus of kidney: Secondary | ICD-10-CM | POA: Diagnosis present

## 2021-06-24 DIAGNOSIS — M8448XA Pathological fracture, other site, initial encounter for fracture: Secondary | ICD-10-CM | POA: Diagnosis not present

## 2021-06-24 DIAGNOSIS — I63132 Cerebral infarction due to embolism of left carotid artery: Secondary | ICD-10-CM | POA: Diagnosis not present

## 2021-06-24 DIAGNOSIS — Z515 Encounter for palliative care: Secondary | ICD-10-CM | POA: Diagnosis not present

## 2021-06-24 LAB — CBC
HCT: 28.1 % — ABNORMAL LOW (ref 39.0–52.0)
Hemoglobin: 9.1 g/dL — ABNORMAL LOW (ref 13.0–17.0)
MCH: 27.1 pg (ref 26.0–34.0)
MCHC: 32.4 g/dL (ref 30.0–36.0)
MCV: 83.6 fL (ref 80.0–100.0)
Platelets: 119 10*3/uL — ABNORMAL LOW (ref 150–400)
RBC: 3.36 MIL/uL — ABNORMAL LOW (ref 4.22–5.81)
RDW: 22.7 % — ABNORMAL HIGH (ref 11.5–15.5)
WBC: 18.1 10*3/uL — ABNORMAL HIGH (ref 4.0–10.5)
nRBC: 0 % (ref 0.0–0.2)

## 2021-06-24 LAB — PROTIME-INR
INR: 1.2 (ref 0.8–1.2)
Prothrombin Time: 15.6 seconds — ABNORMAL HIGH (ref 11.4–15.2)

## 2021-06-24 MED ORDER — ASPIRIN 300 MG RE SUPP: 300.0000 mg | Freq: Every day | RECTAL | Status: DC

## 2021-06-24 MED ORDER — WARFARIN SODIUM 3 MG PO TABS
3.0000 mg | ORAL_TABLET | Freq: Once | ORAL | Status: DC
  Filled 2021-06-24: qty 1

## 2021-06-24 MED ORDER — ACETAMINOPHEN 160 MG/5ML PO SOLN
650.0000 mg | ORAL | Status: DC | PRN
  Administered 2021-07-01 – 2021-07-03 (×4): 650 mg
  Filled 2021-06-24 (×4): qty 20.3

## 2021-06-24 MED ORDER — WARFARIN - PHARMACIST DOSING INPATIENT: Freq: Every day | Status: DC

## 2021-06-24 MED ORDER — HEPARIN (PORCINE) 25000 UT/250ML-% IV SOLN
1200.0000 [IU]/h | INTRAVENOUS | Status: DC
  Filled 2021-06-24: qty 250

## 2021-06-24 MED ORDER — HEPARIN (PORCINE) 25000 UT/250ML-% IV SOLN: 1200.0000 [IU]/h | INTRAVENOUS | Status: DC

## 2021-06-24 MED ORDER — TAMSULOSIN HCL 0.4 MG PO CAPS
0.4000 mg | ORAL_CAPSULE | Freq: Every day | ORAL | Status: DC
  Administered 2021-06-24 – 2021-07-06 (×13): 0.4 mg via ORAL
  Filled 2021-06-24 (×13): qty 1

## 2021-06-24 MED ORDER — ASPIRIN 325 MG PO TABS: 325.0000 mg | ORAL_TABLET | Freq: Every day | ORAL | Status: DC

## 2021-06-24 MED ORDER — WARFARIN SODIUM 3 MG PO TABS
3.0000 mg | ORAL_TABLET | Freq: Once | ORAL | Status: AC
  Administered 2021-06-24: 3 mg via ORAL
  Filled 2021-06-24: qty 1

## 2021-06-24 MED ORDER — ACETAMINOPHEN 325 MG PO TABS
650.0000 mg | ORAL_TABLET | ORAL | Status: DC | PRN
  Administered 2021-06-25 – 2021-07-06 (×14): 650 mg via ORAL
  Filled 2021-06-24 (×18): qty 2

## 2021-06-24 MED ORDER — SENNOSIDES-DOCUSATE SODIUM 8.6-50 MG PO TABS
1.0000 | ORAL_TABLET | Freq: Every evening | ORAL | Status: DC | PRN
  Administered 2021-07-01: 1 via ORAL
  Filled 2021-06-24: qty 1

## 2021-06-24 MED ORDER — HEPARIN (PORCINE) 25000 UT/250ML-% IV SOLN
1200.0000 [IU]/h | INTRAVENOUS | Status: DC
  Administered 2021-06-24 – 2021-06-30 (×7): 1200 [IU]/h via INTRAVENOUS
  Filled 2021-06-24 (×7): qty 250

## 2021-06-24 MED ORDER — WARFARIN SODIUM 3 MG PO TABS: 3.0000 mg | ORAL_TABLET | Freq: Once | ORAL | 0 refills | Status: DC

## 2021-06-24 MED ORDER — ACETAMINOPHEN 650 MG RE SUPP: 650.0000 mg | RECTAL | Status: DC | PRN

## 2021-06-24 NOTE — Progress Notes (Signed)
Inpatient Rehab Admissions Coordinator:   I have a bed for this Pt. On CIR today. Per acute MD, pt. Will be ready following renal ultrasound this afternoon. Pt. Will also need a plan in place for when to resume anticoagulation prior to discharge to CIR. RN may call report to [phone].  Clemens Catholic, [place], [place] Admissions Coordinator  [phone] ([place]) [phone] (office)

## 2021-06-24 NOTE — Plan of Care (Signed)
°  Problem: Education: Goal: Knowledge of General Education information will improve Description: Including pain rating scale, medication(s)/side effects and non-pharmacologic comfort measures Outcome: Progressing   Problem: Education: Goal: Knowledge of disease or condition will improve Outcome: Progressing Goal: Knowledge of secondary prevention will improve (SELECT ALL) Outcome: Progressing

## 2021-06-24 NOTE — Progress Notes (Signed)
PMR Admission Coordinator Pre-Admission Assessment °  °Patient: Stanley Jimenez is an 66 y.o., male °MRN: [id number] °DOB: 06/20/1955 °Height: 5' 8.5" (174 cm) °Weight: 56.5 kg °  °Insurance Information °HMO:     PPO:      PCP:      IPA:      80/20:      OTHER:  °PRIMARY: [employer] Medicaid      Policy#: [id number]      Subscriber: Pt °CM Name:    Callie    Phone#: [phone]     Fax#: [phone] Callie at [employer] called and stated Pt. Approved from 1/31-2/6 with updates due 2/6 °Pre-Cert#: [id number]      Employer:  °Benefits:  Phone #:      Name: Phone: Online - [email or web address] °Eff Date: 11/21/2020 - 11/20/2021 °Deductible: $0 (does not have deductible) °OOP Max: $0 °CIR: 100% coverage °SNF: 100% coverage, first 90 days covered by [employer] Medicaid then members move to FFS °Outpatient: 100% coverage 27 visits per calendar year across all therapy disciplines combined °Home Health:  100% coverage Home Health Aides services must be limited to 100 total visits per year per member °DME: 100% coverage, limited by medical necessity °Providers: in network . ° °SECONDARY:       Policy#:      Phone#:  °  °  °The “Data Collection Information Summary” for patients in Inpatient Rehabilitation Facilities with attached “Privacy Act Statement-Health Care Records” was provided and verbally reviewed with: Patient °  °Emergency Contact Information °Contact Information   °  °  Name Relation Home Work Mobile  °  Moomaw,Leon Brother [phone]      °  Lawson,Debra Sister [phone]      °  Rufino,DONNIE Brother     [phone]  °  °   °  °  °Current Medical History  °Patient Admitting Diagnosis: CVA °History of Present Illness: Stanley Jimenez is a 66-year-old right-handed male with history of paroxysmal SVT, thrombocytopenia, hyperlipidemia, prostate cancer with radioactive seed implant 2016, 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  Per chart review patient lives alone he has a brother with good support.  Presented 06/20/2021 with acute onset of aphasia.  Cranial CT scan negative for acute changes.  CT angiogram head and neck no emergent large vessel occlusion in the head.  High-grade approximately 80% in the proximal right ICA, severe stenosis proximal left V1 segment multifocal moderate to severe stenosis of the proximal left V2 segment.  Mixed plaque in the proximal left ICA resulting in approximate 30 to 40% stenosis.  Suspect thrombus in the right internal jugular vein above the level of the central venous catheter.  Extensive osseous metastatic disease.  MRI of the brain multiple bilateral acute infarcts affecting multiple vascular territories the largest which located the left frontal operculum and left occipital lobe.  TCD bubble study did not indicate PFO.  Echocardiogram with ejection fraction of 55 to 60% no wall motion abnormalities grade 1 diastolic dysfunction.  Admission chemistries unremarkable except sodium 134, WBC 24,000, platelets 96,000, hemoglobin 8.8, ammonia level 24, urine drug screen positive opiates..  Vascular surgery Dr. Brandon Cain consulted in regards to high-grade stenosis right ICA patient asymptomatic not a good candidate for surgical intervention given underlying metastatic malignancy.  Initially placed on IV heparin and transitioning to Coumadin however patient developed hematuria and currently maintained on aspirin therapy awaiting plan in regards to   anticoagulation.  Hemoglobin remained stable 9.1 monitoring of leukocytosis improved from 24,000 and 18,100 and latest platelet count improved to 119,000.  Currently on a dysphagia #2 thin liquid diet.  Therapy evaluations completed due to patient decreased functional mobility and aphasia was admitted for a comprehensive rehab program. °  °  ° Complete NIHSS TOTAL: 6 °  °Patient's medical record from [facility]  has been  reviewed by the rehabilitation admission coordinator and physician. °  °Past Medical History  °    °Past Medical History:  °Diagnosis Date  ° Alcohol abuse, in remission    °  SINCE 10- 2015  ° Anxiety    ° Arthritis    ° Cirrhosis, alcoholic (HCC)    ° Clubbing of fingers    °  congenital  ° Depression    ° Full dentures    ° History of PSVT (paroxysmal supraventricular tachycardia)    °  run of non-sustatined VT 03-10-2015 in setting of alcohol withdrawal in hospital  ° History of seizure    °  03-08-2012  alcohol withdrawal  ° History of thrombocytopenia    °  10/ 2013  in setting of alcohol withdrawal  ° Hyperlipidemia    ° Hypertension    ° Port-A-Cath in place 02/20/2021  ° Prostate cancer (HCC) urologist-  dr dalhstedt/  oncologist- dr manning  °  T1c, Gleason 3+3,  PSA 8.89,  vol 27cc  ° Psoriasis, 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 Yes °  °Family History   °family history includes Alcohol abuse in his mother; Diabetes in his sister. °  °Current Medications °  °Current Facility-Administered Medications:  °  acetaminophen (TYLENOL) tablet 650 mg, 650 mg, Oral, Q4H PRN **OR** acetaminophen (TYLENOL) 160 MG/5ML solution 650 mg, 650 mg, Per Tube, Q4H PRN **OR** acetaminophen (TYLENOL) suppository 650 mg, 650 mg, Rectal, Q4H PRN, Stinson, Jacob J, DO °  aspirin suppository 300 mg, 300 mg, Rectal, Daily, 300 mg at 06/21/21 1121 **OR** aspirin tablet 325 mg, 325 mg, Oral, Daily, Stinson, Jacob J, DO, 325 mg at 06/23/21 1107 °  Chlorhexidine Gluconate Cloth 2 % PADS 6 each, 6 each, Topical, Daily, Dahal, Binaya, MD, 6 each at 06/22/21 1029 °  heparin ADULT infusion 100 units/mL (25000 units/250mL), 1,200 Units/hr, Intravenous, Continuous, Chen, Lydia D, RPH, Last Rate: 12 mL/hr at 06/23/21 0034, 1,200 Units/hr at  06/23/21 0034 °  HYDROmorphone (DILAUDID) injection 1 mg, 1 mg, Intravenous, Q3H PRN, Stinson, Jacob J, DO, 1 mg at 06/22/21 1233 °  senna-docusate (Senokot-S) tablet 1 tablet, 1 tablet, Oral, QHS PRN, Stinson, Jacob J, DO °  warfarin (COUMADIN) tablet 3 mg, 3 mg, Oral, ONCE-1600, Hammons, Kimberly B, RPH °  Warfarin - Pharmacist Dosing Inpatient, , Does not apply, q1600, Chen, Lydia D, RPH, Given at 06/22/21 1833 °  °Patients Current Diet:  °Diet Order   °  °         °    DIET DYS 2 Room service appropriate? Yes; Fluid consistency: Thin  Diet effective now       °  °  °   °  °  °   °  °  °Precautions / Restrictions °Precautions °Precautions: Fall °Precaution Comments: blood in urine °Restrictions °  Weight Bearing Restrictions: No    Has the patient had 2 or more falls or a fall with injury in the past year? Yes   Prior Activity Level Community (5-7x/wk): Pt. was active in the community PTA   Prior Functional Level Self Care: Did the patient need help bathing, dressing, using the toilet or eating? Independent   Indoor Mobility: Did the patient need assistance with walking from room to room (with or without device)? Independent   Stairs: Did the patient need assistance with internal or external stairs (with or without device)? Independent   Functional Cognition: Did the patient need help planning regular tasks such as shopping or remembering to take medications? Independent   Patient Information Are you of Hispanic, Latino/a,or Spanish origin?: A. No, not of Hispanic, Latino/a, or Spanish origin What is your race?: B. Black or African American Do you need or want an interpreter to communicate with a doctor or health care staff?: 0. No   Patient's Response To:  Health Literacy and Transportation Is the patient able to respond to health literacy and transportation needs?: No Health Literacy - How often do you need to have someone help you when you read instructions, pamphlets, or other written  material from your doctor or pharmacy?: Patient unable to respond In the past 12 months, has lack of transportation kept you from medical appointments or from getting medications?: No In the past 12 months, has lack of transportation kept you from meetings, work, or from getting things needed for daily living?: No   Development worker, international aid / [place] Devices/Equipment: None   Prior Device Use: Indicate devices/aids used by the patient prior to current illness, exacerbation or injury? None of the above   Current Functional Level Cognition   Arousal/Alertness: Awake/alert Overall Cognitive Status: Difficult to assess Difficult to assess due to: Impaired communication Current Attention Level: Sustained Orientation Level: Oriented to person, Oriented to place Following Commands: Follows one step commands inconsistently, Follows one step commands with increased time Safety/Judgement: Decreased awareness of safety, Decreased awareness of deficits General Comments: pt scooted all the way down in chair, unable to understand scooting butt back in chair to allow for the leg rest to come down to safely stand up, pt continues with word finding difficulty, easily distracted Attention: Sustained Sustained Attention: Impaired Sustained Attention Impairment: Functional basic, Verbal basic Memory:  (unable to assess due to severe deficits in communication and cognition) Awareness: Impaired Awareness Impairment: Intellectual impairment Problem Solving: Impaired Problem Solving Impairment: Functional basic    Extremity Assessment (includes Sensation/Coordination)   Upper Extremity Assessment: Overall WFL for tasks assessed  Lower Extremity Assessment: Defer to PT evaluation     ADLs   Overall ADL's : Needs assistance/impaired Eating/Feeding: NPO Grooming: Minimal assistance, Sitting Upper Body Bathing: Minimal assistance, Sitting Lower Body Bathing: Moderate assistance, Sit to/from  stand Upper Body Dressing : Minimal assistance, Sitting Lower Body Dressing: Moderate assistance, Sit to/from stand Lower Body Dressing Details (indicate cue type and reason): pt able to doff/don socks with increased time and cues Toilet Transfer: Minimal assistance, Rolling walker (2 wheels) Toilet Transfer Details (indicate cue type and reason): assist for verbal cues. no physical cues required. used RW, however likely to due just as well without it Writer and Hygiene: Maximal assistance, Cueing for safety Toileting - Clothing Manipulation Details (indicate cue type and reason): pt had BM in bed upon arrival, he required max A for hygiene in standing. Attempted by A and initiation of task  however pt not sustaining Functional mobility during ADLs: Minimal assistance, Rolling walker (2 wheels) General ADL Comments: pt is moving well, continues to be limited by impulsivity and impaired communication.     Mobility   Overal bed mobility: Needs Assistance Bed Mobility: Supine to Sit Supine to sit: Supervision Sit to supine: Min guard General bed mobility comments: pt received up in chair     Transfers   Overall transfer level: Needs assistance Equipment used: None Transfers: Sit to/from Stand Sit to Stand: Min guard General transfer comment: min guard for safety, unsteady at first and provided pt with HHA on the R     Ambulation / Gait / Stairs / Wheelchair Mobility   Ambulation/Gait Ambulation/Gait assistance: Herbalist (Feet): 120 Feet Assistive device: Rolling walker (2 wheels) Gait Pattern/deviations: Step-through pattern, Drifts right/left General Gait Details: pt easily distracted vearing L and R with RW not looking where he is going, verbal cues to not push to far out in front, asked for pt to navigate back to his room however pt unable to complete, unsure if it was a deficit in comprehension or focus, attempted to amb without AD, pt reaching  for objects to hold onto, very unsteady and wider base of support Gait velocity: dec Gait velocity interpretation: <1.8 ft/sec, indicate of risk for recurrent falls     Posture / Balance Balance Overall balance assessment: Needs assistance Sitting-balance support: Feet supported Sitting balance-Leahy Scale: Fair Standing balance support: Single extremity supported, During functional activity Standing balance-Leahy Scale: Fair Standing balance comment: pt able to do some of the less complex BERG activity standing at EOB to work on balance and following simple commands with/without cuing.     Special needs/care consideration Skin intact    Previous Home Environment (from acute therapy documentation) Living Arrangements: Alone  Lives With: Alone Available Help at Discharge: Family Type of Home: House Home Layout: One level Home Access: Stairs to enter Entrance Stairs-Rails: Right Entrance Stairs-Number of Steps: 2 Bathroom Shower/Tub: Optometrist: Yes How Accessible: Accessible via walker [place]: No Additional Comments: pt with aphasia and unable to provide any reliable information; no family/caregivers present throughout evaluation   Discharge Living Setting Plans for Discharge Living Setting: House Type of Home at Discharge: House Discharge Home Layout: One level Discharge Home Access: Stairs to enter Entrance Stairs-Rails: Right Entrance Stairs-Number of Steps: 2 Discharge Bathroom Shower/Tub: Tub/shower unit Discharge Bathroom Toilet: Standard Discharge Bathroom Accessibility: Yes How Accessible: Accessible via walker Does the patient have any problems obtaining your medications?: No   Social/Family/Support Systems Patient Roles: Other (Comment) Contact Information: [phone] Anticipated Caregiver: [name] (brother) Ability/Limitations of Caregiver: Can provide Min A Caregiver Availability:  24/7 Discharge Plan Discussed with Primary Caregiver: Yes Is Caregiver In Agreement with Plan?: Yes Does Caregiver/Family have Issues with Lodging/Transportation while Pt is in Rehab?: Yes   Goals Patient/Family Goal for Rehab: PT/OT Supervision, SLP Mod A Expected length of stay: 7-10 days Pt/Family Agrees to Admission and willing to participate: Yes Program Orientation Provided & Reviewed with Pt/Caregiver Including Roles  & Responsibilities: Yes   Decrease burden of Care through IP rehab admission: Specialzed equipment needs, Decrease number of caregivers, Bowel and bladder program, and Patient/family education   Possible need for SNF placement upon discharge: not anticipated    Patient Condition: I have reviewed medical records from [facility] , spoken with CM, and patient. I met with patient at the bedside for inpatient rehabilitation assessment.  Patient will benefit from ongoing PT, OT, and SLP, can actively participate in 3 hours of therapy a day 5 days of the week, and can make measurable gains during the admission.  Patient will also benefit from the coordinated team approach during an Inpatient Acute Rehabilitation admission.  The patient will receive intensive therapy as well as Rehabilitation physician, nursing, social worker, and care management interventions.  Due to safety, skin/wound care, disease management, medication administration, pain management, and patient education the patient requires 24 hour a day rehabilitation nursing.  The patient is currently min A with mobility and basic ADLs.  Discharge setting and therapy post discharge at home with home health is anticipated.  Patient has agreed to participate in the Acute Inpatient Rehabilitation Program and will admit today. °  °Preadmission Screen Completed By:  Laura B Staley, 06/23/2021 3:02 PM °______________________________________________________________________   °Discussed status with Dr. Raulkar  on 06/23/21 at 1511 and  received approval for admission today. °  °Admission Coordinator:  Laura B Staley, CCC-SLP, time 1511/Date 06/23/21  °  °Assessment/Plan: °Diagnosis: Ischemic stroke of frontal lobe °Does the need for close, 24 hr/day Medical supervision in concert with the patient's rehab needs make it unreasonable for this patient to be served in a less intensive setting? Yes °Co-Morbidities requiring supervision/potential complications: essential hypertension, malignant tumor of lower third of esophagus, PAD, acute thrombosis of right internal jugular vein, leukocytosis °Due to bladder management, bowel management, safety, skin/wound care, disease management, medication administration, pain management, and patient education, does the patient require 24 hr/day rehab nursing? Yes °Does the patient require coordinated care of a physician, rehab nurse, PT, OT, and SLP to address physical and functional deficits in the context of the above medical diagnosis(es)? Yes °Addressing deficits in the following areas: balance, endurance, locomotion, strength, transferring, bowel/bladder control, bathing, dressing, feeding, grooming, toileting, cognition, speech, language, swallowing, and psychosocial support °Can the patient actively participate in an intensive therapy program of at least 3 hrs of therapy 5 days a week? Yes °The potential for patient to make measurable gains while on inpatient rehab is excellent °Anticipated functional outcomes upon discharge from inpatient rehab: supervision PT, supervision OT, supervision SLP °Estimated rehab length of stay to reach the above functional goals is: 5-7 days °Anticipated discharge destination: Home °10. Overall Rehab/Functional Prognosis: excellent °  °  °MD Signature: °Krutika Raulkar, MD °

## 2021-06-24 NOTE — Progress Notes (Signed)
[place] for warfarin and heparin Indication: RIJ DVT, stroke   No Known Allergies  Patient Measurements: Height: 5' 8.5" (174 cm) Weight: 56.5 kg (124 lb 9 oz) IBW/kg (Calculated) : 69.55 Heparin Dosing Weight: HEPARIN DW (KG): 59   Vital Signs: Temp: 98.7 F (37.1 C) (02/01 1120) Temp Source: Oral (02/01 1120) BP: 139/76 (02/01 1120) Pulse Rate: 91 (02/01 1120)  Labs: Recent Labs    06/22/21 0315 06/22/21 0800 06/22/21 1629 06/23/21 0034 06/23/21 1025 06/24/21 0419  HGB 9.0*  --   --  8.5*  --  9.1*  HCT 27.7*  --   --  25.2*  --  28.1*  PLT 84*  --   --  89*  --  119*  LABPROT  --   --   --  14.9  --  15.6*  INR  --   --   --  1.2  --  1.2  HEPARINUNFRC  --    < > 0.25* 0.35 0.45  --   CREATININE 0.86  --   --   --   --   --    < > = values in this interval not displayed.     Estimated Creatinine Clearance: 70.3 mL/min (by C-G formula based on SCr of 0.86 mg/dL).     Assessment: Stanley Jimenez is a 66 y.o. 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 No anticoagulation prior to admission. INR on admission was 1.3. Pharmacy consulted for warfarin and heparin.  Anticoagulation was held 1/31 with new onset of hematuria.  MD has reviewed and is ok to resume anticoagulation at this time.   H/H, plt stable despite hematuria overnight. Note patient is also on aspirin 325 mg for stroke and PTA was on megestrol for cancer (currently held). AST slightly elevated, only eating 25%.   Heparin level was previously therapeutic on 1200 units/hr.  Will resume at this rate.  INR subtherapeutic as expected after first dose of warfarin.  Goal of Therapy:  INR 2- 3 Heparin level 0.3-0.5 units/mL Monitor platelets by anticoagulation protocol: Yes   Plan:  Repeat warfarin 3 mg x1 Restart heparin at 1200 units/hr Monitor daily INR, heparin level, and  CBC Monitor for signs/symptoms of bleeding   [employer], Pharm.D., BCPS Clinical Pharmacist Clinical phone for 06/24/2021 from 7:30-3:00 is [phone].  **Pharmacist phone directory can be found on [email or web address] listed under [place].  06/24/2021 3:10 PM

## 2021-06-24 NOTE — Progress Notes (Signed)
Inpatient Rehabilitation Admission Medication Review by a Pharmacist  A complete drug regimen review was completed for this patient to identify any potential clinically significant medication issues.  High Risk Drug Classes Is patient taking? Indication by Medication  Antipsychotic No   Anticoagulant Yes Heparin, coumadin- VTE  Antibiotic No   Opioid No   Antiplatelet No   Hypoglycemics/insulin No   Vasoactive Medication Yes Tamsulosin- BPH  Chemotherapy No   Other No      Type of Medication Issue Identified Description of Issue Recommendation(s)  Drug Interaction(s) (clinically significant)     Duplicate Therapy     Allergy     No Medication Administration End Date     Incorrect Dose     Additional Drug Therapy Needed     Significant med changes from prior encounter (inform family/care partners about these prior to discharge).    Other  PTA meds: FOLFOX + Opdivo q14d. Cycle #8 not given on 06/15/2021 d/t dizziness/weight loss F/up with oncology for Squamous Cell Carcinoma of the esophagus at discharge or during stay, if required based on CEA results and Oncology recommendations    Clinically significant medication issues were identified that warrant physician communication and completion of prescribed/recommended actions by midnight of the next day:  No  Name of provider notified for urgent issues identified:   Provider Method of Notification:     Pharmacist comments:   Time spent performing this drug regimen review (minutes):  30   Jakai Onofre BS, PharmD, BCPS Clinical Pharmacist 06/25/2021 8:04 AM

## 2021-06-24 NOTE — TOC Transition Note (Signed)
Transition of Care [facility]) - CM/SW Discharge Note   Patient Details  Name: Stanley Jimenez MRN: [id number] Date of Birth: [birth year]  [facility] of [facility] CM/SW Contact:  Pollie Friar, RN Phone Number: 06/24/2021, 2:29 PM   Clinical Narrative:    Patient is discharging to CIR today. CM signing off.    Final next level of care: IP Rehab Facility Barriers to Discharge: No Barriers Identified   Patient Goals and CMS Choice     Choice offered to / list presented to : Patient  Discharge Placement                       Discharge Plan and Services In-house Referral: Clinical Social Work Discharge Planning Services: CM Consult                                 Social Determinants of Health (SDOH) Interventions     Readmission Risk Interventions No flowsheet data found.

## 2021-06-24 NOTE — Progress Notes (Signed)
Speech Language Pathology Treatment: Dysphagia;Cognitive-Linquistic  Patient Details Name: Stanley Jimenez MRN: [id number] DOB: 1956/01/21 Today's Date: 06/24/2021 Time: 0910-0940 SLP Time Calculation (min) (ACUTE ONLY): 30 min  Assessment / Plan / Recommendation Clinical Impression  Pt seen for potential diet upgrade with soft foods/thin liquids given with pt requiring A to feed self d/t R weakness with no overt s/s of aspiration present, but impaired mastication and prolonged oral preparation noted during soft food intake.  Pt shook head "no" when asked about increasing diet consistency d/t edentulous state, so continuing D2/thin liquids recommended for optimal nutrition/hydration needs.  ST will continue to f/u if dentures become available from home.  Expressive/receptive aphasia targeted with augmentative communication attempted, but pt would defer and attempt to verbalize and/or gesture with head nod/shake for yes/no and/or use indicators such as body language/gestures for communicating intent.  Pt stated "shoot" when he couldn't get words out during attempts to communicate and yes/no responses were 80% accurate with simple personal information.  Following 1-step commands 100% accurate, but 2-step commands were 50% accurate with mod visual/verbal cues provided.  Question motor planning for certain directives as pt would attempt task, but shake his head when unsuccessful.  Pt's speech was unintelligible, but he was attempting to initiate conversation with SLP during session.  Perseveration on approximation of "well" noted throughout session.  ST will continue to f/u for aphasia/dysphagia treatment while in acute setting.  HPI HPI: Pt is a 66 y/o male admitted secondary to aphasia. MRI revealed multiple bilateral acute infarcts. 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 ST f/u for dysphagia/aphasia.      SLP Plan  Continue with current plan of care      Recommendations for follow up therapy are one component of a multi-disciplinary discharge planning process, led by the attending physician.  Recommendations may be updated based on patient status, additional functional criteria and insurance authorization.    Recommendations  Diet recommendations: Dysphagia 2 (fine chop);Thin liquid Liquids provided via: Cup;Straw Medication Administration: Whole meds with puree Supervision: Staff to assist with self feeding;Intermittent supervision to cue for compensatory strategies Compensations: Minimize environmental distractions;Small sips/bites;Slow rate Postural Changes and/or Swallow Maneuvers: Seated upright 90 degrees                Oral Care Recommendations: Oral care BID;Staff/trained caregiver to provide oral care Follow Up Recommendations: Acute inpatient rehab (3hours/day) Assistance recommended at discharge: Frequent or constant Supervision/Assistance SLP Visit Diagnosis: Dysphagia, unspecified (R13.10);Aphasia (R47.01) Plan: Continue with current plan of care           Elvina Sidle, M.S., CCC-SLP  06/24/2021, 10:07 AM

## 2021-06-24 NOTE — Progress Notes (Signed)
Physical Therapy Treatment Patient Details Name: Stanley Jimenez MRN: [id number] DOB: 03-Dec-1955 Today's Date: 06/24/2021   History of Present Illness Pt is a 66 y/o male admitted secondary to aphasia. MRI revealed multiple bilateral acute infarcts. 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    PT Comments    Pt with improved gait pattern and tolerance however unable to navigate hallway back to room and continuously ran into objects on the R requiring assist to correct. Pt veering L and R all over the hallway, unable to maintain straight line. 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 Attempted to write however pt unable. Acute PT to continue to follow. Continue to recommend AIR upon d/c to address both functional and cognitive deficits.   Recommendations for follow up therapy are one component of a multi-disciplinary discharge planning process, led by the attending physician.  Recommendations may be updated based on patient status, additional functional criteria and insurance authorization.  Follow Up Recommendations  Acute inpatient rehab (3hours/day)     Assistance Recommended at Discharge Frequent or constant Supervision/Assistance  Patient can return home with the following A lot of help with walking and/or transfers;A lot of help with bathing/dressing/bathroom;Assistance with cooking/housework;Direct supervision/assist for medications management;Direct supervision/assist for financial management;Assist for transportation;Help with stairs or ramp for entrance   Equipment Recommendations  Other (comment) (TBD)    Recommendations for Other Services Rehab consult     Precautions / Restrictions Precautions Precautions:  Fall Restrictions Weight Bearing Restrictions: No     Mobility  Bed Mobility Overal bed mobility: Needs Assistance Bed Mobility: Sidelying to Sit   Sidelying to sit: Min guard       General bed mobility comments: pt received in R sidelying, pt able to push self up to EOB with close min guard, increased time    Transfers Overall transfer level: Needs assistance Equipment used: Rolling walker (2 wheels) Transfers: Sit to/from Stand Sit to Stand: Min guard           General transfer comment: verbal cues for hand placement    Ambulation/Gait Ambulation/Gait assistance: Min assist Gait Distance (Feet): 160 Feet Assistive device: Rolling walker (2 wheels) Gait Pattern/deviations: Step-through pattern, Drifts right/left Gait velocity: dec Gait velocity interpretation: <1.31 ft/sec, indicative of household ambulator   General Gait Details: pt continues with severe R sided neglect, pt ran into every obstacle on the R side and required minA for correction, pt vearing L and R t/o hallway. Attempted to have pt look at the room numbers and find his room however would not look to the R, which was the side his room was on, pt with improved fluidity of gait and increased step height and length   Stairs             Wheelchair Mobility    Modified Rankin (Stroke Patients Only) Modified Rankin (Stroke Patients Only) Pre-Morbid Rankin Score: No symptoms Modified Rankin: Moderately severe disability     Balance Overall balance assessment: Needs assistance Sitting-balance support: Feet supported Sitting balance-Leahy Scale: Fair     Standing balance support: Single extremity supported, During functional activity Standing balance-Leahy Scale: Fair                              Cognition Arousal/Alertness: Awake/alert Behavior  During Therapy: Flat affect Overall Cognitive Status: Difficult to assess Area of Impairment: Following commands, Safety/judgement,  Awareness, Attention, Problem solving                   Current Attention Level: Sustained   Following Commands: Follows one step commands inconsistently, Follows one step commands with increased time, Follows multi-step commands inconsistently Safety/Judgement: Decreased awareness of safety, Decreased awareness of deficits Awareness: Emergent Problem Solving: Slow processing, Decreased initiation, Difficulty sequencing, Requires verbal cues General Comments: pt with difficulty writing, could be motor planning and/or comprehension in addition to possible apraxia, pt notably frustrated with inability to vocalize or write, pt unable to navigate back to room despite max verbal cues, pt continues with R inattention        Exercises      General Comments General comments (skin integrity, edema, etc.): VSS, pt continues with condom cath. worked with pt on trying to communicate, pt having significant word finding difficulty and ability to announciate, pt became very frustrate. tried multilple times to write as well however unable      Pertinent Vitals/Pain Pain Assessment Pain Assessment: Faces Faces Pain Scale: No hurt    Home Living                          Prior Function            PT Goals (current goals can now be found in the care plan section) Acute Rehab PT Goals Patient Stated Goal: unable to state PT Goal Formulation: Patient unable to participate in goal setting Time For Goal Achievement: 07/05/21 Potential to Achieve Goals: Good Progress towards PT goals: Progressing toward goals    Frequency    Min 4X/week      PT Plan Current plan remains appropriate    Co-evaluation              AM-PAC PT "6 Clicks" Mobility   Outcome Measure  Help needed turning from your back to your side while in a flat bed without using bedrails?: A Little Help needed moving from lying on your back to sitting on the side of a flat bed without using bedrails?:  A Little Help needed moving to and from a bed to a chair (including a wheelchair)?: A Lot Help needed standing up from a chair using your arms (e.g., wheelchair or bedside chair)?: A Little Help needed to walk in hospital room?: A Lot Help needed climbing 3-5 steps with a railing? : A Lot 6 Click Score: 15    End of Session Equipment Utilized During Treatment: Gait belt Activity Tolerance: Patient tolerated treatment well Patient left: with call bell/phone within reach;in chair;with chair alarm set Nurse Communication: Mobility status PT Visit Diagnosis: Other abnormalities of gait and mobility (R26.89)     Time: 8003-4917 PT Time Calculation (min) (ACUTE ONLY): 25 min  Charges:  $Gait Training: 8-22 mins $Therapeutic Activity: 8-22 mins                     Kittie Plater, PT, DPT Acute Rehabilitation Services Pager #: [phone] Office #: [phone]    Berline Lopes 06/24/2021, 10:13 AM

## 2021-06-24 NOTE — Discharge Summary (Signed)
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 Patient presented to ED on 1/28 from home for altered mental status, with difficulty communicating. In the ED, VSS, labs with WBC count elevated to 24,000, hemoglobin 8.8, platelet 96. CT head negative for any acute intracranial abnormality. MRI brain showed multiple bilateral acute infarcts affecting multiple vascular territories, the largest of which are located in the left frontal operculum and left occipital lobe. 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 CT angio of head and neck did not show any large vessel occlusion but showed high-grade/80% stenosis of the proximal right internal carotid artery, severe stenosis in the proximal left V1 segment and multifocal moderate to severe stenosis in the proximal left V2 segment.  It also showed a suspected thrombus in the right internal jugular vein above the level of the central venous catheter. Extensive osseous metastatic disease. Chest x-ray with known osseous metastasis with multiple lytic lesions of bilateral ribs, LEFT scapula and likely RIGHT proximal humeral. Likely pathologic  rib fracture noted in the LEFT posterior sixth rib, RIGHT first rib and RIGHT seventh rib. Admitted to hospitalist service. Neurology consulted.    Today, patient still unable to communicate/slurred speech/aphasia, but able to follow commands.  Appears comfortable.  Urine in [place] appears pinkish, no obvious gross hematuria noted.    Assessment and Plan: Acute bilateral infarcts Presented with altered mental status, aphasia MRI brain as above showing multiple acute bilateral infarcts affecting multiple vascular territories including left frontal lobe, occipital lobe CT angio head and neck finding as above Neurology consulted, recommend Coumadin and bridged with heparin Echocardiogram with EF 55 to 60%, no regional wall motion abnormality, grade 1 diastolic dysfunction, normal right ventricular size, function and pulmonary artery systolic pressure W0J 6.5, LDL 51 from July 2022 Plan for CIR admission for rehab   High-grade stenosis of the proximal right intracarotid artery peripheral artery disease Vascular surgery consult appreciated.  Not a candidate for intervention because of his pre-existing metastatic cancer.   Acute thrombus of right IJ Per neurology recommendation, start Coumadin, bridge with heparin drip was started  Hematuria Noted on 06/23/2021 Currently improving after brief holding of heparin drip Renal ultrasound unremarkable Discussed with neurology Dr. Leonie Man on 06/24/2021, may restart Coumadin and bridge with heparin drip.  Discontinue aspirin 3 2 5  mg daily  Leukocytosis Unclear etiology.  No fever.  No other evidence of infection.  Gradually trending down.   Essential hypertension Restart home amlodipine 10 mg daily   Malignant tumor of the lower third of esophagus Follows up with Dr. Delton Coombes as an outpatient.  Last visit was on 06/16/2021. Provider checked with him on 06/23/2021, okay to postpone chemotherapy until patient  comes out of rehab.   BPH Flomax 0.4  mg daily   Consultants: Neurology Procedures performed: None Disposition: Rehabilitation facility Diet recommendation:  Cardiac diet  DISCHARGE MEDICATION: Allergies as of 06/24/2021   No Known Allergies      Medication List     TAKE these medications    amLODipine 10 MG tablet Commonly known as: NORVASC TAKE 1 TABLET BY MOUTH DAILY   fluorouracil CALGB 17510 2,400 mg/m2 in sodium chloride 0.9 % 150 mL Inject 2,400 mg/m2 into the vein over 48 hr.   FLUOROURACIL IV Inject into the vein every 14 (fourteen) days.   heparin 25000 UT/250ML infusion Inject 1,200 Units/hr into the vein continuous.   LEUCOVORIN CALCIUM IV Inject into the vein every 14 (fourteen) days.   lidocaine-prilocaine cream Commonly known as: EMLA Apply a small amount to port a cath site and cover with plastic wrap 1 hour prior to chemotherapy appointments   megestrol 400 MG/10ML suspension Commonly known as: MEGACE Take 10 mLs (400 mg total) by mouth 2 (two) times daily.   OXALIPLATIN IV Inject into the vein every 14 (fourteen) days.   oxyCODONE-acetaminophen 10-325 MG tablet Commonly known as: Percocet Take 1 tablet by mouth every 8 (eight) hours as needed for pain.   prochlorperazine 10 MG tablet Commonly known as: COMPAZINE Take 1 tablet (10 mg total) by mouth every 6 (six) hours as needed (Nausea or vomiting).   tamsulosin 0.4 MG Caps capsule Commonly known as: FLOMAX TAKE 1 CAPSULE(0.4 MG) BY MOUTH DAILY What changed: See the new instructions.   triamcinolone cream 0.1 % Commonly known as: KENALOG APPLY TO THE AFFECTED AREA TWICE DAILY What changed:  how much to take additional instructions   warfarin 3 MG tablet Commonly known as: COUMADIN Take 1 tablet (3 mg total) by mouth once for 1 dose.        Follow-up Information     [name], MD Follow up.   Specialty: Family Medicine Why: follow up after rehab stay Contact information: [address], [place] [address] [phone]                 Discharge Exam: Danley Danker Weights   06/20/21 0956 06/20/21 2223  Weight: 59 kg 56.5 kg   General: NAD, noted slurred speech/aphasia, alert/awake, able to follow commands Cardiovascular: S1, S2 present Respiratory: CTAB Abdomen: Soft, nontender, nondistended, bowel sounds present Musculoskeletal: No bilateral pedal edema noted Skin: Normal Psychiatry: Normal mood   Condition at discharge: stable  The results of significant diagnostics from this hospitalization (including imaging, microbiology, ancillary and laboratory) are listed below for reference.   Imaging Studies: CT HEAD WO CONTRAST  Result Date: 06/20/2021 CLINICAL DATA:  Altered mental status this morning. EXAM: CT HEAD WITHOUT CONTRAST TECHNIQUE: Contiguous axial images were obtained from the base of the skull through the vertex without intravenous contrast. RADIATION DOSE REDUCTION: This exam was performed according to the departmental dose-optimization program which includes automated exposure control, adjustment of the mA and/or kV according to patient size and/or use of iterative reconstruction technique. COMPARISON:  03/08/2012 FINDINGS: Brain: No evidence of acute infarction, hemorrhage, hydrocephalus, extra-axial collection or mass lesion/mass effect. Mild ventricular sulcal enlargement reflecting mild volume loss, stable from the prior head CT. Mild areas of white matter hypoattenuation are also noted, also stable, consistent with chronic microvascular ischemic change. Vascular: No hyperdense vessel or unexpected calcification. Skull: Normal. Negative for fracture or focal lesion. Sinuses/Orbits: Visualized globes and orbits are unremarkable.  Visualized sinuses are clear. Other: None. IMPRESSION: 1. No acute intracranial abnormalities. No significant change from prior study. Electronically Signed   By: Lajean Manes M.D.   On: 06/20/2021 11:08   MR BRAIN WO  CONTRAST  Result Date: 06/21/2021 CLINICAL DATA:  Acute neurologic deficit EXAM: MRI HEAD WITHOUT CONTRAST TECHNIQUE: Multiplanar, multiecho pulse sequences of the brain and surrounding structures were obtained without intravenous contrast. COMPARISON:  05/04/2021 FINDINGS: Brain: Multiple bilateral acute infarcts, the largest of which is located in the left frontal operculum. The next largest area is in the left occipital lobe. The other areas are punctate. Multiple vascular territories are affected. No acute or chronic hemorrhage. There is multifocal hyperintense T2-weighted signal within the white matter. Generalized volume loss without a clear lobar predilection. The midline structures are normal. Vascular: Major flow voids are preserved. Skull and upper cervical spine: Normal calvarium and skull base. Visualized upper cervical spine and soft tissues are normal. Sinuses/Orbits:No paranasal sinus fluid levels or advanced mucosal thickening. No mastoid or middle ear effusion. Normal orbits. IMPRESSION: Multiple bilateral acute infarcts affecting multiple vascular territories, the largest of which are located in the left frontal operculum and left occipital lobe. 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 Electronically Signed   By: Ulyses Jarred M.D.   On: 06/21/2021 01:27   US RENAL  Result Date: 06/24/2021 CLINICAL DATA:  Hematuria.  Prostate and esophageal carcinoma. EXAM: RENAL / URINARY TRACT ULTRASOUND COMPLETE COMPARISON:  Ultrasound-guided liver biopsy dated 02/06/2021. Chest, abdomen and pelvis CT dated 05/13/2021. FINDINGS: Right Kidney: Renal measurements: 9.7 x 6.3 x 5.3 cm. = volume: 168 mL. 1.5 cm cyst. Normal echotexture. No hydronephrosis. Left Kidney: Renal measurements: 10.0 x 5.5 x 4.8 cm = volume: 136 mL. 0.7 and 0.6 cm cysts. Normal echotexture. No hydronephrosis. Bladder: Appears normal for degree of bladder distention. Other: Previously demonstrated prostate radiation seed  implants. Multiple liver masses demonstrated on the CT dated 05/13/2021. Small amount of free peritoneal fluid in all 4 quadrants of the abdomen. IMPRESSION: 1. Unremarkable urinary tract without hydronephrosis or visible calculi. 2. Previously demonstrated multiple liver metastases. 3. Small amount of ascites. Electronically Signed   By: Claudie Revering M.D.   On: 06/24/2021 13:25   DG Chest Port 1 View  Result Date: 06/20/2021 CLINICAL DATA:  Esophageal cancer EXAM: PORTABLE CHEST 1 VIEW COMPARISON:  July thirteenth 2022 FINDINGS: The cardiomediastinal silhouette is unchanged in contour.RIGHT chest port with tip terminating over the inferior RIGHT atrium. No pleural effusion. No pneumothorax. No acute pleuroparenchymal abnormality. Visualized abdomen is unremarkable. Osseous metastatic disease with multiple lytic lesions of bilateral ribs, LEFT scapula and likely RIGHT proximal humeral. Likely pathologic rib fracture noted in the LEFT posterior sixth rib, RIGHT first rib and RIGHT seventh rib. IMPRESSION: No acute cardiopulmonary abnormality. Revisualization of osseous metastatic disease. Electronically Signed   By: Valentino Saxon M.D.   On: 06/20/2021 17:25   VAS [place] TRANSCRANIAL DOPPLER W BUBBLES  Result Date: 06/22/2021  Transcranial Doppler with Bubble Patient Name:  KLINT LEZCANO  Date of Exam:   06/22/2021 Medical Rec #: [id number]          Accession #:    [id number] Date of Birth: 02/16/1956           Patient Gender: M Patient Age:   52 years Exam Location:  [facility] Procedure:      VAS [place] TRANSCRANIAL DOPPLER W BUBBLES Referring Phys: PRAMOD SETHI --------------------------------------------------------------------------------  Indications: Stroke. Limitations: Constant patient movement, patient  unable to fully cooperate with              exam/valsalva. Comparison Study: No prior studies. Performing Technologist: Darlin Coco RDMS, RVT  Examination Guidelines: A complete evaluation  includes B-mode imaging, spectral Doppler, color Doppler, and power Doppler as needed of all accessible portions of each vessel. Bilateral testing is considered an integral part of a complete examination. Limited examinations for reoccurring indications may be performed as noted.  Summary: No HITS at rest or during Valsalva. Negative transcranial Doppler Bubble study with no evidence of right to left intracardiac communication.  A vascular evaluation was performed. The right middle cerebral artery was studied. An IV was inserted into the patient's left antecubital. Verbal informed consent was obtained.  *See table(s) above for TCD measurements and observations.    Preliminary    ECHOCARDIOGRAM COMPLETE  Result Date: 06/21/2021    ECHOCARDIOGRAM REPORT   Patient Name:   KENSINGTON DUERST Date of Exam: 06/21/2021 Medical Rec #:  [id number]         Height:       68.5 in Accession #:    [id number]        Weight:       124.6 lb Date of Birth:  1955/10/05          BSA:          1.680 m Patient Age:    35 years          BP:           131/74 mmHg Patient Gender: M                 HR:           86 bpm. Exam Location:  Inpatient Procedure: 2D Echo, Cardiac Doppler and Color Doppler Indications:    Stroke I63.9  History:        Patient has no prior history of Echocardiogram examinations.                 Risk Factors:Hypertension, Dyslipidemia and ETOH.  Sonographer:    Bernadene Person RDCS Referring Phys: [place]  1. Left ventricular ejection fraction, by estimation, is 55 to 60%. The left ventricle has normal function. The left ventricle has no regional wall motion abnormalities. Left ventricular diastolic parameters are consistent with Grade I diastolic dysfunction (impaired relaxation).  2. Right ventricular systolic function is normal. The right ventricular size is normal. There is normal pulmonary artery systolic pressure.  3. The mitral valve is normal in structure. Trivial mitral valve  regurgitation. No evidence of mitral stenosis.  4. The aortic valve is tricuspid. There is mild calcification of the aortic valve. There is moderate thickening of the aortic valve. Aortic valve regurgitation is mild to moderate. Aortic valve sclerosis/calcification is present, without any evidence of  aortic stenosis.  5. The inferior vena cava is normal in size with greater than 50% respiratory variability, suggesting right atrial pressure of 3 mmHg. FINDINGS  Left Ventricle: Left ventricular ejection fraction, by estimation, is 55 to 60%. The left ventricle has normal function. The left ventricle has no regional wall motion abnormalities. The left ventricular internal cavity size was normal in size. There is  no left ventricular hypertrophy. Left ventricular diastolic parameters are consistent with Grade I diastolic dysfunction (impaired relaxation). Normal left ventricular filling pressure. Right Ventricle: The right ventricular size is normal. No increase in right ventricular wall thickness. Right ventricular systolic function is normal. There  is normal pulmonary artery systolic pressure. The tricuspid regurgitant velocity is 2.42 m/s, and  with an assumed right atrial pressure of 3 mmHg, the estimated right ventricular systolic pressure is 43.1 mmHg. Left Atrium: Left atrial size was normal in size. Right Atrium: Right atrial size was normal in size. Pericardium: There is no evidence of pericardial effusion. Mitral Valve: The mitral valve is normal in structure. Mild mitral annular calcification. Trivial mitral valve regurgitation. No evidence of mitral valve stenosis. Tricuspid Valve: The tricuspid valve is normal in structure. Tricuspid valve regurgitation is mild . No evidence of tricuspid stenosis. Aortic Valve: The aortic valve is tricuspid. There is mild calcification of the aortic valve. There is moderate thickening of the aortic valve. Aortic valve regurgitation is mild to moderate. Aortic regurgitation  PHT measures 378 msec. Aortic valve sclerosis/calcification is present, without any evidence of aortic stenosis. Aortic valve mean gradient measures 8.0 mmHg. Aortic valve peak gradient measures 15.5 mmHg. Aortic valve area, by VTI measures 1.87 cm. Pulmonic Valve: The pulmonic valve was normal in structure. Pulmonic valve regurgitation is not visualized. No evidence of pulmonic stenosis. Aorta: The aortic root is normal in size and structure. Venous: The inferior vena cava is normal in size with greater than 50% respiratory variability, suggesting right atrial pressure of 3 mmHg. IAS/Shunts: No atrial level shunt detected by color flow Doppler. Additional Comments: A device lead is visualized in the right ventricle.  LEFT VENTRICLE PLAX 2D LVIDd:         5.10 cm      Diastology LVIDs:         3.70 cm      LV e' lateral:   10.30 cm/s LV PW:         0.70 cm      LV E/e' lateral: 8.3 LV IVS:        0.90 cm LVOT diam:     2.00 cm LV SV:         62 LV SV Index:   37 LVOT Area:     3.14 cm  LV Volumes (MOD) LV vol d, MOD A2C: 123.0 ml LV vol d, MOD A4C: 116.0 ml LV vol s, MOD A2C: 61.5 ml LV vol s, MOD A4C: 57.1 ml LV SV MOD A2C:     61.5 ml LV SV MOD A4C:     116.0 ml LV SV MOD BP:      58.6 ml RIGHT VENTRICLE RV S prime:     13.60 cm/s TAPSE (M-mode): 1.7 cm LEFT ATRIUM             Index        RIGHT ATRIUM           Index LA diam:        2.90 cm 1.73 cm/m   RA Area:     12.80 cm LA Vol (A2C):   32.3 ml 19.22 ml/m  RA Volume:   28.20 ml  16.78 ml/m LA Vol (A4C):   26.0 ml 15.47 ml/m LA Biplane Vol: 29.1 ml 17.32 ml/m  AORTIC VALVE AV Area (Vmax):    1.90 cm AV Area (Vmean):   1.83 cm AV Area (VTI):     1.87 cm AV Vmax:           197.00 cm/s AV Vmean:          132.000 cm/s AV VTI:            0.329 m AV Peak Grad:  15.5 mmHg AV Mean Grad:      8.0 mmHg LVOT Vmax:         119.00 cm/s LVOT Vmean:        77.100 cm/s LVOT VTI:          0.196 m LVOT/AV VTI ratio: 0.60 AI PHT:            378 msec AR Vena  Contracta: 0.40 cm  AORTA Ao Root diam: 3.10 cm Ao Asc diam:  3.30 cm MITRAL VALVE                TRICUSPID VALVE MV Area (PHT): 4.29 cm     TR Peak grad:   23.4 mmHg MV Decel Time: 177 msec     TR Vmax:        242.00 cm/s MV E velocity: 85.10 cm/s MV A velocity: 136.00 cm/s  SHUNTS MV E/A ratio:  0.63         Systemic VTI:  0.20 m                             Systemic Diam: 2.00 cm Dani Gobble Croitoru MD Electronically signed by Sanda Klein MD Signature Date/Time: 06/21/2021/11:36:28 AM    Final    CT ANGIO HEAD NECK W WO CM W PERF (CODE STROKE)  Result Date: 06/20/2021 CLINICAL DATA:  Altered mental status EXAM: CT ANGIOGRAPHY HEAD AND NECK CT PERFUSION BRAIN TECHNIQUE: Multidetector CT imaging of the head and neck was performed using the standard protocol during bolus administration of intravenous contrast. Multiplanar CT image reconstructions and MIPs were obtained to evaluate the vascular anatomy. Carotid stenosis measurements (when applicable) are obtained utilizing NASCET criteria, using the distal internal carotid diameter as the denominator. Multiphase CT imaging of the brain was performed following IV bolus contrast injection. Subsequent parametric perfusion maps were calculated using RAPID software. RADIATION DOSE REDUCTION: This exam was performed according to the departmental dose-optimization program which includes automated exposure control, adjustment of the mA and/or kV according to patient size and/or use of iterative reconstruction technique. CONTRAST:  157mL OMNIPAQUE IOHEXOL 350 MG/ML SOLN COMPARISON:  Same-day noncontrast head CT, brain MRI 05/04/2021 FINDINGS: CTA NECK FINDINGS Aortic arch: There is mild calcified atherosclerotic plaque of the aortic arch. The origins of the major branch vessels are patent. The subclavian arteries are patent. Right carotid system: The right common carotid artery is patent. The right external carotid artery is patent. There is mixed plaque in the proximal  right internal carotid artery resulting in focal high-grade stenosis, approximally 80% (9-63, 6-184). The internal carotid artery distal to this point is widely patent. Left carotid system: The left common carotid artery is patent. Left external carotid artery is patent. There is mixed plaque in the proximal left internal carotid artery resulting in approximally 30-40% stenosis. The distal left internal carotid artery is patent. Vertebral arteries: There is soft plaque in the proximal right V1 segment resulting in mild stenosis. Remainder of the right vertebral artery is patent. There is multifocal mixed plaque in the proximal left vertebral artery resulting in severe stenosis in the V1 segment (6-265), and multifocal moderate to severe stenosis of the proximal V2 segment (6-235). The distal left V2 segment and V3 segment are patent. Skeleton: There are extensive lytic and sclerotic lesions throughout the imaged cervical spine, clavicles, and sternum consistent with metastatic disease. Other neck: The soft tissues are unremarkable. Upper chest: The imaged lung apices are clear. Review  of the MIP images confirms the above findings CTA HEAD FINDINGS Anterior circulation: The intracranial ICAs are patent. The bilateral MCAs are patent. The bilateral ACAs are patent. The anterior communicating artery is normal. There is no aneurysm or AVM. Posterior circulation: The bilateral V4 segments are patent. PICA is identified bilaterally. The basilar artery is patent. The bilateral PCAs are patent. The posterior communicating arteries are not identified. There is no aneurysm or AVM. Venous sinuses: The dural venous sinuses are patent. There is suspected thrombus in the right internal jugular vein (7-146). Anatomic variants: None. Review of the MIP images confirms the above findings CT Brain Perfusion Findings: Note that the patient moved during the CT perfusion acquisition. ASPECTS: 9 CBF (<30%) Volume: 63mL Perfusion  (Tmax>6.0s) volume: 73mL Mismatch Volume: 73mL Infarction Location:Left frontal operculum IMPRESSION: 1. Patient moved during the CT perfusion acquisition, likely affecting the calculated core and penumbra. The 11 cc infarct core identified in the left frontal operculum is likely real, as in retrospect there is loss of gray-white differentiation in this location on the prior noncontrast head CT. 37 cc penumbra is identified scattered in the left cerebral hemisphere, though this is likely at least in part artifactual. Consider correlation with MRI to evaluate the extent of ischemia. 2. No emergent large vessel occlusion in the head. 3. High-grade (approximally 80%) in the proximal right internal carotid artery. 4. Severe stenosis in the proximal left V1 segment and multifocal moderate to severe stenosis in the proximal left V2 segment. 5. Mixed plaque in the proximal left ICA resulting in up to approximately 30-40% stenosis. Patent right vertebral artery. 6. Suspected thrombus in the right internal jugular vein above the level of the central venous catheter. Consider further evaluation with ultrasound. 7. Extensive osseous metastatic disease. These results were called by telephone at the time of interpretation on 06/20/2021 at 4:37 pm to provider Dr Rogene Houston , who verbally acknowledged these results. Electronically Signed   By: Valetta Mole M.D.   On: 06/20/2021 16:42    Microbiology: Results for orders placed or performed during the hospital encounter of 06/20/21  Resp Panel by RT-PCR (Flu A&B, Covid) Nasopharyngeal Swab     Status: None   Collection Time: 06/20/21 10:19 AM   Specimen: Nasopharyngeal Swab; Nasopharyngeal(NP) swabs in vial transport medium  Result Value Ref Range Status   SARS Coronavirus 2 by RT PCR NEGATIVE NEGATIVE Final    Comment: (NOTE) SARS-CoV-2 target nucleic acids are NOT DETECTED.  The SARS-CoV-2 RNA is generally detectable in upper respiratory specimens during the acute phase  of infection. The lowest concentration of SARS-CoV-2 viral copies this assay can detect is 138 copies/mL. A negative result does not preclude SARS-Cov-2 infection and should not be used as the sole basis for treatment or other patient management decisions. A negative result may occur with  improper specimen collection/handling, submission of specimen other than nasopharyngeal swab, presence of viral mutation(s) within the areas targeted by this assay, and inadequate number of viral copies(<138 copies/mL). A negative result must be combined with clinical observations, patient history, and epidemiological information. The expected result is Negative.  Fact Sheet for Patients:  EntrepreneurPulse.com.au  Fact Sheet for Healthcare Providers:  [employer]  This test is no t yet approved or cleared by the [place] FDA and  has been authorized for detection and/or diagnosis of SARS-CoV-2 by FDA under an Emergency Use Authorization (EUA). This EUA will remain  in effect (meaning this test can be used) for the duration of the COVID-19  declaration under Section 564(b)(1) of the Act, 21 U.S.C.section 360bbb-3(b)(1), unless the authorization is terminated  or revoked sooner.       Influenza A by PCR NEGATIVE NEGATIVE Final   Influenza B by PCR NEGATIVE NEGATIVE Final    Comment: (NOTE) The Xpert Xpress SARS-CoV-2/FLU/RSV plus assay is intended as an aid in the diagnosis of influenza from Nasopharyngeal swab specimens and should not be used as a sole basis for treatment. Nasal washings and aspirates are unacceptable for Xpert Xpress SARS-CoV-2/FLU/RSV testing.  Fact Sheet for Patients: EntrepreneurPulse.com.au  Fact Sheet for Healthcare Providers: [employer]  This test is not yet approved or cleared by the [place] FDA and has been authorized for detection and/or diagnosis of  SARS-CoV-2 by FDA under an Emergency Use Authorization (EUA). This EUA will remain in effect (meaning this test can be used) for the duration of the COVID-19 declaration under Section 564(b)(1) of the Act, 21 U.S.C. section 360bbb-3(b)(1), unless the authorization is terminated or revoked.  Performed at [facility], [address], [place], [place] [address]     Labs: CBC: Recent Labs  Lab 06/20/21 1028 06/21/21 0222 06/22/21 0315 06/23/21 0034 06/24/21 0419  WBC 24.0* 23.9* 21.6* 21.1* 18.1*  NEUTROABS 20.1*  --   --   --   --   HGB 8.8* 9.0* 9.0* 8.5* 9.1*  HCT 28.0* 29.1* 27.7* 25.2* 28.1*  MCV 87.2 85.8 85.0 83.4 83.6  PLT 96* 93* 84* 89* 185*   Basic Metabolic Panel: Recent Labs  Lab 06/20/21 1026 06/20/21 1028 06/22/21 0315  NA 134* 134* 132*  K 4.7 3.8 4.8  CL 104 104 105  CO2  --  24 19*  GLUCOSE 107* 106* 104*  BUN 22 17 12   CREATININE 0.80 0.98 0.86  CALCIUM  --  9.7 9.0   Liver Function Tests: Recent Labs  Lab 06/20/21 1028  AST 43*  ALT 35  ALKPHOS 222*  BILITOT 0.3  PROT 6.7  ALBUMIN 2.9*   CBG: Recent Labs  Lab 06/21/21 1624  GLUCAP 97    Discharge time spent: greater than 30 minutes.  Signed: Alma Friendly, MD [facility] 06/24/2021

## 2021-06-24 NOTE — H&P (Incomplete)
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  Per chart review patient lives alone he has a brother with good support.  Presented 06/20/2021 with acute onset of aphasia.  Cranial CT scan negative for acute changes.  CT angiogram head and neck no emergent large vessel occlusion in the head.  High-grade approximately 80% in the proximal right ICA, severe stenosis proximal left V1 segment multifocal moderate to severe stenosis of the proximal left V2 segment.  Mixed plaque in the proximal left ICA resulting in approximate 30 to 40% stenosis.  Suspect thrombus in the right internal jugular vein above the level of the central venous catheter.  Extensive osseous metastatic disease.  MRI of the brain multiple bilateral acute infarcts affecting multiple vascular territories the largest which located the left frontal operculum and left occipital lobe.  TCD bubble study did not indicate PFO.  Renal ultrasound unremarkable urinary tract without hydronephrosis.  Previously demonstrated multiple liver metastasis.  Small amount of ascites.  Echocardiogram with ejection fraction of 55 to 60% no wall motion abnormalities grade 1 diastolic dysfunction.  Admission chemistries unremarkable except sodium 134, WBC 24,000, platelets 96,000, hemoglobin 8.8, ammonia level 24, urine drug screen positive opiates..  Vascular surgery Dr. Servando Snare consulted in regards to high-grade stenosis right ICA patient asymptomatic not a good candidate for surgical intervention given underlying metastatic malignancy.  Initially  placed on IV heparin and transitioning to Coumadin however patient developed hematuria and currently maintained on aspirin therapy and it was later decided that heparin could be resumed transitioning to Coumadin.  Hemoglobin remained stable 9.1 monitoring of leukocytosis improved from 24,000 and 18,100 and latest platelet count improved to 119,000.  Currently on a dysphagia #2 thin liquid diet.  Therapy evaluations completed due to patient decreased functional mobility and aphasia was admitted for a comprehensive rehab program.  Review of Systems  Constitutional:  Negative for chills and fever.  HENT:  Negative for hearing loss.   Eyes:  Negative for blurred vision and double vision.  Respiratory:  Negative for cough and shortness of breath.   Cardiovascular:  Positive for palpitations. Negative for chest pain and leg swelling.  Gastrointestinal:  Positive for constipation. Negative for heartburn, nausea and vomiting.  Genitourinary:  Positive for hematuria. Negative for dysuria and flank pain.  Musculoskeletal:  Positive for myalgias.  Skin:  Negative for rash.  Neurological:  Positive for speech change and weakness.  Psychiatric/Behavioral:  Positive for depression.        Anxiety  All other systems reviewed and are negative. Past Medical History:  Diagnosis Date   Alcohol abuse, in remission    SINCE 10- 2015   Anxiety    Arthritis    Cirrhosis, alcoholic (HCC)    Clubbing of fingers    congenital   Depression    Full dentures    History of PSVT (paroxysmal supraventricular tachycardia)    run of non-sustatined VT 03-10-2015 in setting of alcohol withdrawal in hospital   History of seizure    03-08-2012  alcohol withdrawal   History of thrombocytopenia    10/ 2013  in setting of alcohol withdrawal   Hyperlipidemia  Hypertension    Port-A-Cath in place 02/20/2021   Prostate cancer [facility]) urologist-  dr dalhstedt/  oncologist- dr Tammi Klippel   T1c, Gleason 3+3,  PSA 8.89,  vol 27cc    Psoriasis, 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   Seizures ([place])    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   Squamous cell carcinoma of esophagus ([place]) 04/21/2016   Past Surgical History:  Procedure Laterality Date   BIOPSY  04/14/2016   Procedure: BIOPSY;  Surgeon: Rogene Houston, MD;  [facility];  Service: Endoscopy;;  esophagus   COLONOSCOPY N/A 04/14/2016   Procedure: COLONOSCOPY;  Surgeon: Rogene Houston, MD;  [facility];  Service: Endoscopy;  Laterality: N/A;   ESOPHAGOGASTRODUODENOSCOPY N/A 04/14/2016   Procedure: ESOPHAGOGASTRODUODENOSCOPY (EGD);  Surgeon: Rogene Houston, MD;  [facility];  Service: Endoscopy;  Laterality: N/A;  1:00   NO PAST SURGERIES     PORTACATH PLACEMENT Right 05/31/2016   Procedure: INSERTION OF TUNNELED RIGHT INTERNAL JUGULAR BARD POWERPORT CENTRAL VENOUS CATHETER WITH SUBCUTANEOUS PORT;  Surgeon: Vickie Epley, MD;  Location: [facility];  Service: Vascular;  Laterality: Right;   RADIOACTIVE SEED IMPLANT N/A 09/20/2014   Procedure: RADIOACTIVE SEED IMPLANT;  Surgeon: Franchot Gallo, MD;  Location: [facility];  Service: Urology;  Laterality: N/A;    3   seeds implanted no seeds found in bladder   TRANSURETHRAL RESECTION OF PROSTATE     Family History  Problem Relation Age of Onset   Alcohol abuse Mother    Diabetes Sister    Social History:  reports that he quit smoking about 7 months ago. His smoking use included cigarettes. He has a 10.00 pack-year smoking history. He quit smokeless tobacco use about 37 years ago.  His smokeless tobacco use included chew. He reports that he does not currently use alcohol. He reports that he does not currently use drugs. Allergies: No Known Allergies Medications Prior to Admission  Medication Sig Dispense Refill   amLODipine (NORVASC) 10 MG tablet TAKE 1 TABLET BY MOUTH DAILY (Patient taking differently: Take 10 mg by mouth daily.) 90 tablet 1    fluorouracil CALGB 16109 2,400 mg/m2 in sodium chloride 0.9 % 150 mL Inject 2,400 mg/m2 into the vein over 48 hr.     FLUOROURACIL IV Inject into the vein every 14 (fourteen) days.     LEUCOVORIN CALCIUM IV Inject into the vein every 14 (fourteen) days.     megestrol (MEGACE) 400 MG/10ML suspension Take 10 mLs (400 mg total) by mouth 2 (two) times daily. 480 mL 2   OXALIPLATIN IV Inject into the vein every 14 (fourteen) days.     oxyCODONE-acetaminophen (PERCOCET) 10-325 MG tablet Take 1 tablet by mouth every 8 (eight) hours as needed for pain. 84 tablet 0   prochlorperazine (COMPAZINE) 10 MG tablet Take 1 tablet (10 mg total) by mouth every 6 (six) hours as needed (Nausea or vomiting). 30 tablet 1   tamsulosin (FLOMAX) 0.4 MG CAPS capsule TAKE 1 CAPSULE(0.4 MG) BY MOUTH DAILY (Patient taking differently: Take 0.4 mg by mouth daily.) 90 capsule 1   triamcinolone cream (KENALOG) 0.1 % APPLY TO THE AFFECTED AREA TWICE DAILY (Patient taking differently: Apply 1 application topically 2 (two) times daily.) 454 g 0   lidocaine-prilocaine (EMLA) cream Apply a small amount to port a cath site and cover with plastic wrap 1 hour prior to chemotherapy appointments (Patient not taking: Reported on 06/16/2021) 30 g 3  Drug Regimen Review Drug regimen was reviewed and remains appropriate with no significant issues identified  Home: Home Living Family/patient expects to be discharged to:: Unsure Living Arrangements: Alone Available Help at Discharge: Family Type of Home: House Home Access: Stairs to enter Technical brewer of Steps: 2 Entrance Stairs-Rails: Right Home Layout: One level Bathroom Shower/Tub: Government social research officer Accessibility: Yes Additional Comments: pt with aphasia and unable to provide any reliable information; no family/caregivers present throughout evaluation  Lives With: Alone   Functional History: Prior Function Prior Level of Function :  Patient poor historian/Family not available  Functional Status:  Mobility: Bed Mobility Overal bed mobility: Needs Assistance Bed Mobility: Sidelying to Sit Sidelying to sit: Min guard Supine to sit: Supervision Sit to supine: Min guard General bed mobility comments: pt received in R sidelying, pt able to push self up to EOB with close min guard, increased time Transfers Overall transfer level: Needs assistance Equipment used: Rolling walker (2 wheels) Transfers: Sit to/from Stand Sit to Stand: Min guard General transfer comment: verbal cues for hand placement Ambulation/Gait Ambulation/Gait assistance: Min assist Gait Distance (Feet): 160 Feet Assistive device: Rolling walker (2 wheels) Gait Pattern/deviations: Step-through pattern, Drifts right/left General Gait Details: pt continues with severe R sided neglect, pt ran into every obstacle on the R side and required minA for correction, pt vearing L and R t/o hallway. Attempted to have pt look at the room numbers and find his room however would not look to the R, which was the side his room was on, pt with improved fluidity of gait and increased step height and length Gait velocity: dec Gait velocity interpretation: <1.31 ft/sec, indicative of household ambulator    ADL: ADL Overall ADL's : Needs assistance/impaired Eating/Feeding: NPO Grooming: Minimal assistance, Sitting Upper Body Bathing: Minimal assistance, Sitting Lower Body Bathing: Moderate assistance, Sit to/from stand Upper Body Dressing : Minimal assistance, Sitting Lower Body Dressing: Moderate assistance, Sit to/from stand Lower Body Dressing Details (indicate cue type and reason): pt able to doff/don socks with increased time and cues Toilet Transfer: Minimal assistance, Rolling walker (2 wheels) Toilet Transfer Details (indicate cue type and reason): assist for verbal cues. no physical cues required. used RW, however likely to due just as well without  it Writer and Hygiene: Maximal assistance, Cueing for safety Toileting - Clothing Manipulation Details (indicate cue type and reason): pt had BM in bed upon arrival, he required max A for hygiene in standing. Attempted by A and initiation of task however pt not sustaining Functional mobility during ADLs: Minimal assistance, Rolling walker (2 wheels) General ADL Comments: pt is moving well, continues to be limited by impulsivity and impaired communication.  Cognition: Cognition Overall Cognitive Status: Difficult to assess Arousal/Alertness: Awake/alert Orientation Level: Oriented to person, Oriented to place Attention: Sustained Sustained Attention: Impaired Sustained Attention Impairment: Functional basic, Verbal basic Memory:  (unable to assess due to severe deficits in communication and cognition) Awareness: Impaired Awareness Impairment: Intellectual impairment Problem Solving: Impaired Problem Solving Impairment: Functional basic Cognition Arousal/Alertness: Awake/alert Behavior During Therapy: Flat affect Overall Cognitive Status: Difficult to assess Area of Impairment: Following commands, Safety/judgement, Awareness, Attention, Problem solving Current Attention Level: Sustained Following Commands: Follows one step commands inconsistently, Follows one step commands with increased time, Follows multi-step commands inconsistently Safety/Judgement: Decreased awareness of safety, Decreased awareness of deficits Awareness: Emergent Problem Solving: Slow processing, Decreased initiation, Difficulty sequencing, Requires verbal cues General Comments: pt with difficulty writing, could be motor planning and/or  comprehension in addition to possible apraxia, pt notably frustrated with inability to vocalize or write, pt unable to navigate back to room despite max verbal cues, pt continues with R inattention Difficult to assess due to: Impaired  communication  Physical Exam: Blood pressure 125/62, pulse 93, temperature 99.1 F (37.3 C), temperature source Oral, resp. rate 20, height 5' 8.5" (1.74 m), weight 56.5 kg, SpO2 100 %. Physical Exam Neurological:     Comments: Patient is alert.  Makes eye contact with examiner.  Expressive receptive aphasia.  He does follow some simple commands.    Results for orders placed or performed during the hospital encounter of 06/20/21 (from the past 48 hour(s))  Heparin level (unfractionated)     Status: Abnormal   Collection Time: 06/22/21  4:29 PM  Result Value Ref Range   Heparin Unfractionated 0.25 (L) 0.30 - 0.70 IU/mL    Comment: (NOTE) The clinical reportable range upper limit is being lowered to >1.10 to align with the FDA approved guidance for the current laboratory assay.  If heparin results are below expected values, and patient dosage has  been confirmed, suggest follow up testing of antithrombin III levels. Performed at [facility], [place] [address] [place], [place] [address]   CBC     Status: Abnormal   Collection Time: 06/23/21 12:34 AM  Result Value Ref Range   WBC 21.1 (H) 4.0 - 10.5 K/uL   RBC 3.02 (L) 4.22 - 5.81 MIL/uL   Hemoglobin 8.5 (L) 13.0 - 17.0 g/dL   HCT 25.2 (L) 39.0 - 52.0 %   MCV 83.4 80.0 - 100.0 fL   MCH 28.1 26.0 - 34.0 pg   MCHC 33.7 30.0 - 36.0 g/dL   RDW 23.1 (H) 11.5 - 15.5 %   Platelets 89 (L) 150 - 400 K/uL    Comment: Immature Platelet Fraction may be clinically indicated, consider ordering this additional test [id number] CONSISTENT WITH PREVIOUS RESULT REPEATED TO VERIFY    nRBC 0.0 0.0 - 0.2 %    Comment: Performed at [facility], [place] [address] [place], [place] [address]  Protime-INR     Status: None   Collection Time: 06/23/21 12:34 AM  Result Value Ref Range   Prothrombin Time 14.9 11.4 - 15.2 seconds   INR 1.2 0.8 - 1.2    Comment: (NOTE) INR goal varies based on device and disease states. Performed at [facility], [place] [address], [place], [place] [address]   Heparin level (unfractionated)     Status: None   Collection Time: 06/23/21 12:34 AM  Result Value Ref Range   Heparin Unfractionated 0.35 0.30 - 0.70 IU/mL    Comment: (NOTE) The clinical reportable range upper limit is being lowered to >1.10 to align with the FDA approved guidance for the current laboratory assay.  If heparin results are below expected values, and patient dosage has  been confirmed, suggest follow up testing of antithrombin III levels. Performed at [facility], [place] [address], [place], [place] [address]   Heparin level (unfractionated)     Status: None   Collection Time: 06/23/21 10:25 AM  Result Value Ref Range   Heparin Unfractionated 0.45 0.30 - 0.70 IU/mL    Comment: (NOTE) The clinical reportable range upper limit is being lowered to >1.10 to align with the FDA approved guidance for the current laboratory assay.  If heparin results are below expected values, and patient dosage has  been confirmed, suggest follow  up testing of antithrombin III levels. Performed at [facility], [place] [address] [place], [place] [address]   CBC     Status: Abnormal   Collection Time: 06/24/21  4:19 AM  Result Value Ref Range   WBC 18.1 (H) 4.0 - 10.5 K/uL   RBC 3.36 (L) 4.22 - 5.81 MIL/uL   Hemoglobin 9.1 (L) 13.0 - 17.0 g/dL   HCT 28.1 (L) 39.0 - 52.0 %   MCV 83.6 80.0 - 100.0 fL   MCH 27.1 26.0 - 34.0 pg   MCHC 32.4 30.0 - 36.0 g/dL   RDW 22.7 (H) 11.5 - 15.5 %   Platelets 119 (L) 150 - 400 K/uL    Comment: CONSISTENT WITH PREVIOUS RESULT REPEATED TO VERIFY    nRBC 0.0 0.0 - 0.2 %    Comment: Performed at [facility], [place] [address] [place], [place] [address]  Protime-INR     Status: Abnormal   Collection Time: 06/24/21  4:19 AM  Result Value Ref Range   Prothrombin Time 15.6 (H) 11.4 - 15.2 seconds   INR 1.2 0.8 - 1.2    Comment: (NOTE) INR goal varies based on device and  disease states. Performed at [facility], [place] [address], [place], [place] [address]    VAS [place] TRANSCRANIAL DOPPLER W BUBBLES  Result Date: 06/22/2021  Transcranial Doppler with Bubble Patient Name:  KAMARI BUCH  Date of Exam:   06/22/2021 Medical Rec #: [id number]          Accession #:    [id number] Date of Birth: 05/06/1956           Patient Gender: M Patient Age:   36 years Exam Location:  [facility] Procedure:      VAS [place] TRANSCRANIAL DOPPLER W BUBBLES Referring Phys: PRAMOD SETHI --------------------------------------------------------------------------------  Indications: Stroke. Limitations: Constant patient movement, patient unable to fully cooperate with              exam/valsalva. Comparison Study: No prior studies. Performing Technologist: Darlin Coco RDMS, RVT  Examination Guidelines: A complete evaluation includes B-mode imaging, spectral Doppler, color Doppler, and power Doppler as needed of all accessible portions of each vessel. Bilateral testing is considered an integral part of a complete examination. Limited examinations for reoccurring indications may be performed as noted.  Summary: No HITS at rest or during Valsalva. Negative transcranial Doppler Bubble study with no evidence of right to left intracardiac communication.  A vascular evaluation was performed. The right middle cerebral artery was studied. An IV was inserted into the patient's left antecubital. Verbal informed consent was obtained.  *See table(s) above for TCD measurements and observations.    Preliminary        Medical Problem List and Plan: 1. Functional deficits secondary to multiple bilateral acute infarcts largest located left frontal and occipital lobe likely secondary to embolic disease versus hypercoagulability from esophageal cancer  -patient may *** shower  -ELOS/Goals: *** 2.  Antithrombotics: -DVT/anticoagulation:  Pharmaceutical: Coumadin bridging with heparin until INR  therapeutic  -antiplatelet therapy: Presently on low-dose aspirin will be able to discontinue 3. Pain Management: Tylenol as needed 4. Mood: Provide emotional support  -antipsychotic agents: N/A 5. Neuropsych: This patient is not capable of making decisions on his own behalf. 6. Skin/Wound Care: Routine skin checks 7. Fluids/Electrolytes/Nutrition: Routine in and outs with follow-up chemistries 8.  Acute thrombus right IJ.  Await plan on anticoagulation 9.  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  Not a candidate for intervention due to pre-existing metastatic cancer 10.  Thrombocytopenia/acute on chronic anemia/leukocytosis.  Follow-up CBC 11.  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  Follow-up Dr.Katragadda.  Follow-up chemotherapy as outpatient 12.  Prostate cancer with radioactive seed implant.  Follow-up outpatient.  Flomax 0.4 mg daily 13.  Hypertension.  Monitor with increased mobility.  Home medication Norvasc 10 mg daily prior to admission.  Resume as needed 14.  Paroxysmal SVT.  Cardiac rate controlled Cathlyn Parsons, PA-C 06/24/2021

## 2021-06-24 NOTE — H&P (Signed)
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  Per chart review patient lives alone he has a brother with good support.  Presented 06/20/2021 with acute onset of aphasia.  Cranial CT scan negative for acute changes.  CT angiogram head and neck no emergent large vessel occlusion in the head.  High-grade approximately 80% in the proximal right ICA, severe stenosis proximal left V1 segment multifocal moderate to severe stenosis of the proximal left V2 segment.  Mixed plaque in the proximal left ICA resulting in approximate 30 to 40% stenosis.  Suspect thrombus in the right internal jugular vein above the level of the central venous catheter.  Extensive osseous metastatic disease.  MRI of the brain multiple bilateral acute infarcts affecting multiple vascular territories the largest which located the left frontal operculum and left occipital lobe.  TCD bubble study did not indicate PFO.  Renal ultrasound unremarkable urinary tract without hydronephrosis.  Previously demonstrated multiple liver metastasis.  Small amount of ascites.  Echocardiogram with ejection fraction of 55 to 60% no wall motion abnormalities grade 1 diastolic dysfunction.  Admission chemistries unremarkable except sodium 134, WBC 24,000, platelets 96,000, hemoglobin 8.8, ammonia level 24, urine drug screen positive opiates..  Vascular surgery Dr. Servando Snare consulted in regards to high-grade stenosis right ICA patient asymptomatic not a good candidate for surgical intervention given underlying metastatic malignancy.  Initially placed on IV heparin and transitioning  to Coumadin however patient developed hematuria and currently maintained on aspirin therapy and it was later decided that heparin could be resumed transitioning to Coumadin.  Hemoglobin remained stable 9.1 monitoring of leukocytosis improved from 24,000 and 18,100 and latest platelet count improved to 119,000.  Currently on a dysphagia #2 thin liquid diet.  Therapy evaluations completed due to patient decreased functional mobility and aphasia was admitted for a comprehensive rehab program. Expressive aphasia appears worse than receptive.  Review of Systems  Constitutional:  Negative for chills and fever.  HENT:  Negative for hearing loss.   Eyes:  Negative for blurred vision and double vision.  Respiratory:  Negative for cough and shortness of breath.   Cardiovascular:  Positive for palpitations. Negative for chest pain and leg swelling.  Gastrointestinal:  Positive for constipation. Negative for heartburn, nausea and vomiting.  Genitourinary:  Positive for hematuria. Negative for dysuria and flank pain.  Musculoskeletal:  Positive for myalgias.  Skin:  Negative for rash.  Neurological:  Positive for speech change and weakness.  Psychiatric/Behavioral:  Positive for depression.        Anxiety  All other systems reviewed and are negative. Past Medical History:  Diagnosis Date   Alcohol abuse, in remission    SINCE 10- 2015   Anxiety    Arthritis    Cirrhosis, alcoholic (HCC)    Clubbing of fingers    congenital   Depression    Full dentures    History of PSVT (paroxysmal supraventricular tachycardia)    run of non-sustatined VT 03-10-2015 in setting of alcohol withdrawal in hospital   History of seizure    03-08-2012  alcohol withdrawal   History of thrombocytopenia    10/ 2013  in setting of alcohol withdrawal   Hyperlipidemia    Hypertension  Port-A-Cath in place 02/20/2021   Prostate cancer [facility]) urologist-  dr dalhstedt/  oncologist- dr Tammi Klippel   T1c, Gleason 3+3,  PSA 8.89,   vol 27cc   Psoriasis, 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   Seizures ([place])    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   Squamous cell carcinoma of esophagus ([place]) 04/21/2016   Past Surgical History:  Procedure Laterality Date   BIOPSY  04/14/2016   Procedure: BIOPSY;  Surgeon: Rogene Houston, MD;  [facility];  Service: Endoscopy;;  esophagus   COLONOSCOPY N/A 04/14/2016   Procedure: COLONOSCOPY;  Surgeon: Rogene Houston, MD;  [facility];  Service: Endoscopy;  Laterality: N/A;   ESOPHAGOGASTRODUODENOSCOPY N/A 04/14/2016   Procedure: ESOPHAGOGASTRODUODENOSCOPY (EGD);  Surgeon: Rogene Houston, MD;  [facility];  Service: Endoscopy;  Laterality: N/A;  1:00   NO PAST SURGERIES     PORTACATH PLACEMENT Right 05/31/2016   Procedure: INSERTION OF TUNNELED RIGHT INTERNAL JUGULAR BARD POWERPORT CENTRAL VENOUS CATHETER WITH SUBCUTANEOUS PORT;  Surgeon: Vickie Epley, MD;  Location: [facility];  Service: Vascular;  Laterality: Right;   RADIOACTIVE SEED IMPLANT N/A 09/20/2014   Procedure: RADIOACTIVE SEED IMPLANT;  Surgeon: Franchot Gallo, MD;  Location: [facility];  Service: Urology;  Laterality: N/A;    87   seeds implanted no seeds found in bladder   TRANSURETHRAL RESECTION OF PROSTATE     Family History  Problem Relation Age of Onset   Alcohol abuse Mother    Diabetes Sister    Social History:  reports that he quit smoking about 7 months ago. His smoking use included cigarettes. He has a 10.00 pack-year smoking history. He quit smokeless tobacco use about 37 years ago.  His smokeless tobacco use included chew. He reports that he does not currently use alcohol. He reports that he does not currently use drugs. Allergies: No Known Allergies Medications Prior to Admission  Medication Sig Dispense Refill   amLODipine (NORVASC) 10 MG tablet TAKE 1 TABLET BY MOUTH DAILY (Patient taking differently: Take 10 mg by mouth daily.) 90 tablet 1    fluorouracil CALGB 97989 2,400 mg/m2 in sodium chloride 0.9 % 150 mL Inject 2,400 mg/m2 into the vein over 48 hr.     FLUOROURACIL IV Inject into the vein every 14 (fourteen) days.     heparin 25000 UT/250ML infusion Inject 1,200 Units/hr into the vein continuous.     LEUCOVORIN CALCIUM IV Inject into the vein every 14 (fourteen) days.     lidocaine-prilocaine (EMLA) cream Apply a small amount to port a cath site and cover with plastic wrap 1 hour prior to chemotherapy appointments (Patient not taking: Reported on 06/16/2021) 30 g 3   megestrol (MEGACE) 400 MG/10ML suspension Take 10 mLs (400 mg total) by mouth 2 (two) times daily. 480 mL 2   OXALIPLATIN IV Inject into the vein every 14 (fourteen) days.     oxyCODONE-acetaminophen (PERCOCET) 10-325 MG tablet Take 1 tablet by mouth every 8 (eight) hours as needed for pain. 84 tablet 0   prochlorperazine (COMPAZINE) 10 MG tablet Take 1 tablet (10 mg total) by mouth every 6 (six) hours as needed (Nausea or vomiting). 30 tablet 1   tamsulosin (FLOMAX) 0.4 MG CAPS capsule TAKE 1 CAPSULE(0.4 MG) BY MOUTH DAILY (Patient taking differently: Take 0.4 mg by mouth daily.) 90 capsule 1   triamcinolone cream (KENALOG) 0.1 % APPLY TO THE AFFECTED AREA TWICE DAILY (Patient taking differently:  Apply 1 application topically 2 (two) times daily.) 454 g 0   warfarin (COUMADIN) 3 MG tablet Take 1 tablet (3 mg total) by mouth once for 1 dose. 1 tablet 0    Drug Regimen Review Drug regimen was reviewed and remains appropriate with no significant issues identified  Home: Home Living Family/patient expects to be discharged to:: Unsure Living Arrangements: Alone Available Help at Discharge: Family Type of Home: House Home Access: Stairs to enter Technical brewer of Steps: 2 Entrance Stairs-Rails: Right Home Layout: One level Bathroom Shower/Tub: Government social research officer Accessibility: Yes Additional Comments: pt with aphasia  and unable to provide any reliable information; no family/caregivers present throughout evaluation  Lives With: Alone   Functional History: Prior Function Prior Level of Function : Patient poor historian/Family not available   Functional Status:  Mobility: Bed Mobility Overal bed mobility: Needs Assistance Bed Mobility: Sidelying to Sit Sidelying to sit: Min guard Supine to sit: Supervision Sit to supine: Min guard General bed mobility comments: pt received in R sidelying, pt able to push self up to EOB with close min guard, increased time Transfers Overall transfer level: Needs assistance Equipment used: Rolling walker (2 wheels) Transfers: Sit to/from Stand Sit to Stand: Min guard General transfer comment: verbal cues for hand placement Ambulation/Gait Ambulation/Gait assistance: Min assist Gait Distance (Feet): 160 Feet Assistive device: Rolling walker (2 wheels) Gait Pattern/deviations: Step-through pattern, Drifts right/left General Gait Details: pt continues with severe R sided neglect, pt ran into every obstacle on the R side and required minA for correction, pt vearing L and R t/o hallway. Attempted to have pt look at the room numbers and find his room however would not look to the R, which was the side his room was on, pt with improved fluidity of gait and increased step height and length Gait velocity: dec Gait velocity interpretation: <1.31 ft/sec, indicative of household ambulator   ADL: ADL Overall ADL's : Needs assistance/impaired Eating/Feeding: NPO Grooming: Minimal assistance, Sitting Upper Body Bathing: Minimal assistance, Sitting Lower Body Bathing: Moderate assistance, Sit to/from stand Upper Body Dressing : Minimal assistance, Sitting Lower Body Dressing: Moderate assistance, Sit to/from stand Lower Body Dressing Details (indicate cue type and reason): pt able to doff/don socks with increased time and cues Toilet Transfer: Minimal assistance, Rolling  walker (2 wheels) Toilet Transfer Details (indicate cue type and reason): assist for verbal cues. no physical cues required. used RW, however likely to due just as well without it Writer and Hygiene: Maximal assistance, Cueing for safety Toileting - Clothing Manipulation Details (indicate cue type and reason): pt had BM in bed upon arrival, he required max A for hygiene in standing. Attempted by A and initiation of task however pt not sustaining Functional mobility during ADLs: Minimal assistance, Rolling walker (2 wheels) General ADL Comments: pt is moving well, continues to be limited by impulsivity and impaired communication.   Cognition: Cognition Overall Cognitive Status: Difficult to assess Arousal/Alertness: Awake/alert Orientation Level: Oriented to person, Oriented to place Attention: Sustained Sustained Attention: Impaired Sustained Attention Impairment: Functional basic, Verbal basic Memory:  (unable to assess due to severe deficits in communication and cognition) Awareness: Impaired Awareness Impairment: Intellectual impairment Problem Solving: Impaired Problem Solving Impairment: Functional basic Cognition Arousal/Alertness: Awake/alert Behavior During Therapy: Flat affect Overall Cognitive Status: Difficult to assess Area of Impairment: Following commands, Safety/judgement, Awareness, Attention, Problem solving Current Attention Level: Sustained Following Commands: Follows one step commands inconsistently, Follows one step commands with increased time,  Follows multi-step commands inconsistently Safety/Judgement: Decreased awareness of safety, Decreased awareness of deficits Awareness: Emergent Problem Solving: Slow processing, Decreased initiation, Difficulty sequencing, Requires verbal cues General Comments: pt with difficulty writing, could be motor planning and/or comprehension in addition to possible apraxia, pt notably frustrated with  inability to vocalize or write, pt unable to navigate back to room despite max verbal cues, pt continues with R inattention Difficult to assess due to: Impaired communication  Physical Exam: There were no vitals taken for this visit. Gen: no distress, normal appearing HEENT: oral mucosa pink and moist, NCAT Cardio: Reg rate Chest: normal effort, normal rate of breathing Abd: soft, non-distended Ext: no edema Psych: pleasant, normal affect Skin: intact Neurological:     Comments: Patient is alert.  Makes eye contact with examiner.  Expressive > receptive aphasia.  He does follow some simple commands. 5/5 strength throughout.   Results for orders placed or performed during the hospital encounter of 06/20/21 (from the past 48 hour(s))  CBC     Status: Abnormal   Collection Time: 06/23/21 12:34 AM  Result Value Ref Range   WBC 21.1 (H) 4.0 - 10.5 K/uL   RBC 3.02 (L) 4.22 - 5.81 MIL/uL   Hemoglobin 8.5 (L) 13.0 - 17.0 g/dL   HCT 25.2 (L) 39.0 - 52.0 %   MCV 83.4 80.0 - 100.0 fL   MCH 28.1 26.0 - 34.0 pg   MCHC 33.7 30.0 - 36.0 g/dL   RDW 23.1 (H) 11.5 - 15.5 %   Platelets 89 (L) 150 - 400 K/uL    Comment: Immature Platelet Fraction may be clinically indicated, consider ordering this additional test [id number] CONSISTENT WITH PREVIOUS RESULT REPEATED TO VERIFY    nRBC 0.0 0.0 - 0.2 %    Comment: Performed at [facility], [address] [place], [place] [address]  Protime-INR     Status: None   Collection Time: 06/23/21 12:34 AM  Result Value Ref Range   Prothrombin Time 14.9 11.4 - 15.2 seconds   INR 1.2 0.8 - 1.2    Comment: (NOTE) INR goal varies based on device and disease states. Performed at [facility], [place] [address], [place], [place] [address]   Heparin level (unfractionated)     Status: None   Collection Time: 06/23/21 12:34 AM  Result Value Ref Range   Heparin Unfractionated 0.35 0.30 - 0.70 IU/mL    Comment: (NOTE) The clinical reportable range  upper limit is being lowered to >1.10 to align with the FDA approved guidance for the current laboratory assay.  If heparin results are below expected values, and patient dosage has  been confirmed, suggest follow up testing of antithrombin III levels. Performed at [facility], [place] [address], [place], [place] [address]   Heparin level (unfractionated)     Status: None   Collection Time: 06/23/21 10:25 AM  Result Value Ref Range   Heparin Unfractionated 0.45 0.30 - 0.70 IU/mL    Comment: (NOTE) The clinical reportable range upper limit is being lowered to >1.10 to align with the FDA approved guidance for the current laboratory assay.  If heparin results are below expected values, and patient dosage has  been confirmed, suggest follow up testing of antithrombin III levels. Performed at [facility], [place] [address], [place], [place] [address]   CBC     Status: Abnormal   Collection Time: 06/24/21  4:19 AM  Result Value Ref Range   WBC 18.1 (  H) 4.0 - 10.5 K/uL   RBC 3.36 (L) 4.22 - 5.81 MIL/uL   Hemoglobin 9.1 (L) 13.0 - 17.0 g/dL   HCT 28.1 (L) 39.0 - 52.0 %   MCV 83.6 80.0 - 100.0 fL   MCH 27.1 26.0 - 34.0 pg   MCHC 32.4 30.0 - 36.0 g/dL   RDW 22.7 (H) 11.5 - 15.5 %   Platelets 119 (L) 150 - 400 K/uL    Comment: CONSISTENT WITH PREVIOUS RESULT REPEATED TO VERIFY    nRBC 0.0 0.0 - 0.2 %    Comment: Performed at [facility], [place] [address] [place], [place] [address]  Protime-INR     Status: Abnormal   Collection Time: 06/24/21  4:19 AM  Result Value Ref Range   Prothrombin Time 15.6 (H) 11.4 - 15.2 seconds   INR 1.2 0.8 - 1.2    Comment: (NOTE) INR goal varies based on device and disease states. Performed at [facility], [place] [address], [place], [place] [address]    US RENAL  Result Date: 06/24/2021 CLINICAL DATA:  Hematuria.  Prostate and esophageal carcinoma. EXAM: RENAL / URINARY TRACT ULTRASOUND COMPLETE COMPARISON:  Ultrasound-guided  liver biopsy dated 02/06/2021. Chest, abdomen and pelvis CT dated 05/13/2021. FINDINGS: Right Kidney: Renal measurements: 9.7 x 6.3 x 5.3 cm. = volume: 168 mL. 1.5 cm cyst. Normal echotexture. No hydronephrosis. Left Kidney: Renal measurements: 10.0 x 5.5 x 4.8 cm = volume: 136 mL. 0.7 and 0.6 cm cysts. Normal echotexture. No hydronephrosis. Bladder: Appears normal for degree of bladder distention. Other: Previously demonstrated prostate radiation seed implants. Multiple liver masses demonstrated on the CT dated 05/13/2021. Small amount of free peritoneal fluid in all 4 quadrants of the abdomen. IMPRESSION: 1. Unremarkable urinary tract without hydronephrosis or visible calculi. 2. Previously demonstrated multiple liver metastases. 3. Small amount of ascites. Electronically Signed   By: Claudie Revering M.D.   On: 06/24/2021 13:25       Medical Problem List and Plan: 1. Functional deficits secondary to multiple bilateral acute infarcts largest located left frontal and occipital lobe likely secondary to embolic disease versus hypercoagulability from esophageal cancer  -patient may shower  -ELOS/Goals: 5-7 days S  -Admit to CIR 2.  Antithrombotics: -DVT/anticoagulation:  Pharmaceutical: Coumadin bridging with heparin until INR therapeutic  -antiplatelet therapy: Presently on low-dose aspirin will be able to discontinue 3. Pain Management: Tylenol as needed 4. Mood: Provide emotional support  -antipsychotic agents: N/A 5. Neuropsych: This patient is not capable of making decisions on his own behalf. 6. Skin/Wound Care: Routine skin checks 7. Hyponatremia: repeat Na tomorrow.  8.  Acute thrombus right IJ.  Await plan on anticoagulation 9.  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  Not a candidate for intervention due to pre-existing metastatic cancer 10.  Thrombocytopenia/acute on chronic anemia/leukocytosis.  Follow-up CBC 11.  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   Follow-up Dr.Katragadda.  Follow-up chemotherapy as outpatient 12.  Prostate cancer with radioactive seed implant.  Follow-up outpatient.  Continue Flomax 0.4 mg daily 13.  Hypertension.  Well controlled. Monitor with increased mobility.  Home medication Norvasc 10 mg daily prior to admission.  Resume as needed. Continue flomax.  14.  Paroxysmal SVT.  Cardiac rate controlled 15. Prediabetes: Hgb A1c 6.3 on 06/21/21- continue to monitor CBGs.   I have personally performed a face to face diagnostic evaluation, including, but not limited to relevant history and physical exam findings, of this patient and developed relevant assessment  and plan.  Additionally, I have reviewed and concur with the physician assistant's documentation above.  Lavon Paganini Angiulli, PA-C   Izora Ribas, MD 06/24/2021

## 2021-06-25 DIAGNOSIS — I63132 Cerebral infarction due to embolism of left carotid artery: Secondary | ICD-10-CM

## 2021-06-25 LAB — COMPREHENSIVE METABOLIC PANEL
ALT: 36 U/L (ref 0–44)
AST: 41 U/L (ref 15–41)
Albumin: 2.3 g/dL — ABNORMAL LOW (ref 3.5–5.0)
Alkaline Phosphatase: 197 U/L — ABNORMAL HIGH (ref 38–126)
Anion gap: 10 (ref 5–15)
BUN: 12 mg/dL (ref 8–23)
CO2: 21 mmol/L — ABNORMAL LOW (ref 22–32)
Calcium: 9.7 mg/dL (ref 8.9–10.3)
Chloride: 104 mmol/L (ref 98–111)
Creatinine, Ser: 0.89 mg/dL (ref 0.61–1.24)
GFR, Estimated: >60 mL/min (ref >60)
Glucose, Bld: 102 mg/dL — ABNORMAL HIGH (ref 70–99)
Potassium: 3.8 mmol/L (ref 3.5–5.1)
Sodium: 135 mmol/L (ref 135–145)
Total Bilirubin: 0.5 mg/dL (ref 0.3–1.2)
Total Protein: 5.8 g/dL — ABNORMAL LOW (ref 6.5–8.1)

## 2021-06-25 LAB — PROTIME-INR
INR: 1.2 (ref 0.8–1.2)
Prothrombin Time: 15.5 seconds — ABNORMAL HIGH (ref 11.4–15.2)

## 2021-06-25 LAB — CBC WITH DIFFERENTIAL/PLATELET
Abs Immature Granulocytes: 0.39 10*3/uL — ABNORMAL HIGH (ref 0.00–0.07)
Basophils Absolute: 0.1 10*3/uL (ref 0.0–0.1)
Basophils Relative: 1 %
Eosinophils Absolute: 0.1 10*3/uL (ref 0.0–0.5)
Eosinophils Relative: 1 %
HCT: 27 % — ABNORMAL LOW (ref 39.0–52.0)
Hemoglobin: 9.1 g/dL — ABNORMAL LOW (ref 13.0–17.0)
Immature Granulocytes: 2 %
Lymphocytes Relative: 9 %
Lymphs Abs: 1.8 10*3/uL (ref 0.7–4.0)
MCH: 28 pg (ref 26.0–34.0)
MCHC: 33.7 g/dL (ref 30.0–36.0)
MCV: 83.1 fL (ref 80.0–100.0)
Monocytes Absolute: 2 10*3/uL — ABNORMAL HIGH (ref 0.1–1.0)
Monocytes Relative: 11 %
Neutro Abs: 14.1 10*3/uL — ABNORMAL HIGH (ref 1.7–7.7)
Neutrophils Relative %: 76 %
Platelets: 150 10*3/uL (ref 150–400)
RBC: 3.25 MIL/uL — ABNORMAL LOW (ref 4.22–5.81)
RDW: 22.4 % — ABNORMAL HIGH (ref 11.5–15.5)
WBC: 18.5 10*3/uL — ABNORMAL HIGH (ref 4.0–10.5)
nRBC: 0 % (ref 0.0–0.2)

## 2021-06-25 LAB — HEPARIN LEVEL (UNFRACTIONATED)
Heparin Unfractionated: 0.34 IU/mL (ref 0.30–0.70)
Heparin Unfractionated: 0.48 IU/mL (ref 0.30–0.70)

## 2021-06-25 SURGERY — ECHOCARDIOGRAM, TRANSESOPHAGEAL
Anesthesia: Monitor Anesthesia Care

## 2021-06-25 MED ORDER — CHLORHEXIDINE GLUCONATE CLOTH 2 % EX PADS
6.0000 | MEDICATED_PAD | Freq: Every day | CUTANEOUS | Status: DC
  Administered 2021-06-25 – 2021-07-02 (×8): 6 via TOPICAL

## 2021-06-25 MED ORDER — WARFARIN SODIUM 3 MG PO TABS
3.0000 mg | ORAL_TABLET | Freq: Once | ORAL | Status: AC
Start: 2021-06-25 — End: 2021-06-25
  Administered 2021-06-25: 3 mg via ORAL
  Filled 2021-06-25: qty 1

## 2021-06-25 NOTE — Progress Notes (Signed)
ANTICOAGULATION CONSULT NOTE- Follow-up  Pharmacy Consult for warfarin and heparin Indication: RIJ DVT, stroke   No Known Allergies  Patient Measurements:   Heparin Dosing Weight: 59 kg    Vital Signs: Temp: 98.2 F (36.8 C) (02/02 0619) Temp Source: Oral (02/02 0619) BP: 128/74 (02/02 0619) Pulse Rate: 95 (02/02 0619)  Labs: Recent Labs    06/23/21 0034 06/23/21 1025 06/24/21 0419 06/25/21 0425  HGB 8.5*  --  9.1* 9.1*  HCT 25.2*  --  28.1* 27.0*  PLT 89*  --  119* 150  LABPROT 14.9  --  15.6* 15.5*  INR 1.2  --  1.2 1.2  HEPARINUNFRC 0.35 0.45  --  0.34  CREATININE  --   --   --  0.89     Estimated Creatinine Clearance: 67.9 mL/min (by C-G formula based on SCr of 0.89 mg/dL).     Assessment: Stanley Jimenez is a 66 y.o. 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 No anticoagulation prior to admission. INR on admission was 1.3. Pharmacy consulted for warfarin and heparin.  Anticoagulation was held 1/31 with new onset of hematuria.  MD has reviewed and is ok to resume anticoagulation at this time.   H/H, plt stable despite hematuria overnight. Note patient is also on aspirin 325 mg for stroke and PTA was on megestrol for cancer (currently held). AST slightly elevated, only eating 25%.   Heparin level was previously therapeutic on 1200 units/hr.  Will resume at this rate.  INR subtherapeutic as expected after first dose of warfarin.  Goal of Therapy:  INR 2- 3 Heparin level 0.3-0.5 units/mL Monitor platelets by anticoagulation protocol: Yes   Plan:  Repeat warfarin 3 mg x1 Continue heparin at 1200 units/hr Heparin level 12:00 today Monitor daily INR, heparin level, and CBC Monitor for signs/symptoms of bleeding    Constantina Laseter BS, PharmD, BCPS Clinical Pharmacist 06/25/2021 8:07 AM

## 2021-06-25 NOTE — Evaluation (Signed)
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[facility])   Past Medical History:  Past Medical History:  Diagnosis Date   Alcohol abuse, in remission    SINCE 10- 2015   Anxiety    Arthritis    Cirrhosis, alcoholic ([place])    Clubbing of fingers    congenital   Depression    Full dentures    History of PSVT (paroxysmal supraventricular tachycardia)    run of non-sustatined VT 03-10-2015 in setting of alcohol withdrawal in hospital   History of seizure    03-08-2012  alcohol withdrawal   History of thrombocytopenia    10/ 2013  in setting of alcohol withdrawal   Hyperlipidemia    Hypertension    Port-A-Cath in place 02/20/2021   Prostate cancer [facility]) urologist-  dr dalhstedt/  oncologist- dr Tammi Klippel   T1c, Gleason 3+3,  PSA 8.89,  vol 27cc   Psoriasis, 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   Seizures ([place])    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   Past Surgical History:  Past Surgical History:  Procedure Laterality Date   BIOPSY  04/14/2016   Procedure: BIOPSY;  Surgeon: Rogene Houston, MD;  [facility];  Service: Endoscopy;;  esophagus   COLONOSCOPY N/A 04/14/2016   Procedure: COLONOSCOPY;  Surgeon: Rogene Houston, MD;  Location: [facility];  Service: Endoscopy;  Laterality: N/A;   ESOPHAGOGASTRODUODENOSCOPY N/A 04/14/2016   Procedure: ESOPHAGOGASTRODUODENOSCOPY (EGD);   Surgeon: Rogene Houston, MD;  Location: [facility];  Service: Endoscopy;  Laterality: N/A;  1:00   NO PAST SURGERIES     PORTACATH PLACEMENT Right 05/31/2016   Procedure: INSERTION OF TUNNELED RIGHT INTERNAL JUGULAR BARD POWERPORT CENTRAL VENOUS CATHETER WITH SUBCUTANEOUS PORT;  Surgeon: Vickie Epley, MD;  Location: [facility] ORS;  Service: Vascular;  Laterality: Right;   RADIOACTIVE SEED IMPLANT N/A 09/20/2014   Procedure: RADIOACTIVE SEED IMPLANT;  Surgeon: Franchot Gallo, MD;  Location: [facility];  Service: Urology;  Laterality: N/A;    24   seeds implanted no seeds found in bladder   TRANSURETHRAL RESECTION OF PROSTATE      Assessment & Plan Clinical Impression: Patient is a 66 y.o. year old male with history of paroxysmal SVT, thrombocytopenia, hyperlipidemia, prostate cancer with radioactive seed implant 2016, history of static squamous cell carcinoma of esophagus currently undergoing chemotherapy, alcohol abuse currently in remission, hypertension, quit smoking 7 months ago.  Per chart review patient lives alone he has a brother with good support.  Presented 06/20/2021 with acute onset of aphasia.  Cranial CT scan negative for acute changes.  CT angiogram head and neck no emergent large vessel occlusion in the head.  High-grade approximately 80% in the proximal right ICA, severe stenosis proximal left V1 segment multifocal moderate to severe stenosis of the proximal left V2 segment.  Mixed plaque in the proximal left ICA resulting in approximate 30 to 40% stenosis.  Suspect thrombus in the right internal jugular vein above the level of  the central venous catheter.  Extensive osseous metastatic disease.  MRI of the brain multiple bilateral acute infarcts affecting multiple vascular territories the largest which located the left frontal operculum and left occipital lobe.  TCD bubble study did not indicate PFO.  Renal ultrasound unremarkable urinary tract without hydronephrosis.   Previously demonstrated multiple liver metastasis.  Small amount of ascites.  Echocardiogram with ejection fraction of 55 to 60% no wall motion abnormalities grade 1 diastolic dysfunction.  Admission chemistries unremarkable except sodium 134, WBC 24,000, platelets 96,000, hemoglobin 8.8, ammonia level 24, urine drug screen positive opiates..  Vascular surgery Dr. Servando Snare consulted in regards to high-grade stenosis right ICA patient asymptomatic not a good candidate for surgical intervention given underlying metastatic malignancy.  Initially placed on IV heparin and transitioning to Coumadin however patient developed hematuria and currently maintained on aspirin therapy and it was later decided that heparin could be resumed transitioning to Coumadin.  Hemoglobin remained stable 9.1 monitoring of leukocytosis improved from 24,000 and 18,100 and latest platelet count improved to 119,000.  Currently on a dysphagia #2 thin liquid diet.  Therapy evaluations completed due to patient decreased functional mobility and aphasia was admitted for a comprehensive rehab program. Expressive aphasia appears worse than receptive.Patient transferred to CIR on 06/24/2021 .    Patient currently requires min with basic self-care skills secondary to muscle weakness, decreased cardiorespiratoy endurance, impaired timing and sequencing, unbalanced muscle activation, decreased coordination, and decreased motor planning, decreased attention to right and decreased motor planning, decreased awareness, decreased problem solving, decreased safety awareness, and decreased memory, and decreased standing balance, decreased postural control, and decreased balance strategies.  Prior to hospitalization, patient could complete BADL/IADL/mobility per chart review with modified independent .  Patient will benefit from skilled intervention to decrease level of assist with basic self-care skills, increase independence with basic self-care skills,  and increase level of independence with iADL prior to discharge home with care partner.  Anticipate patient will require 24 hour supervision and follow up outpatient.  OT - End of Session Activity Tolerance: Tolerates 10 - 20 min activity with multiple rests Endurance Deficit: Yes OT Assessment Rehab Potential (ACUTE ONLY): Good OT Barriers to Discharge: Decreased caregiver support;Inaccessible home environment;Home environment access/layout OT Patient demonstrates impairments in the following area(s): Balance;Cognition;Endurance;Motor;Perception;Safety;Vision OT Basic ADL's Functional Problem(s): Grooming;Bathing;Eating;Dressing;Toileting OT Advanced ADL's Functional Problem(s): Simple Meal Preparation;Light Housekeeping OT Transfers Functional Problem(s): Toilet;Tub/Shower OT Additional Impairment(s): Fuctional Use of Upper Extremity OT Plan OT Intensity: Minimum of 1-2 x/day, 45 to 90 minutes OT Frequency: 5 out of 7 days OT Duration/Estimated Length of Stay: 5-7 days OT Treatment/Interventions: Balance/vestibular training;Disease mangement/prevention;Neuromuscular re-education;Self Care/advanced ADL retraining;Therapeutic Exercise;Wheelchair propulsion/positioning;UE/LE Strength taining/ROM;Skin care/wound managment;Pain management;DME/adaptive equipment instruction;Cognitive remediation/compensation;Community reintegration;Functional electrical stimulation;Patient/family education;Splinting/orthotics;UE/LE Coordination activities;Visual/perceptual remediation/compensation;Therapeutic Activities;Psychosocial support;Functional mobility training;Discharge planning OT Self Feeding Anticipated Outcome(s): mod I OT Basic Self-Care Anticipated Outcome(s): S OT Toileting Anticipated Outcome(s): S OT Bathroom Transfers Anticipated Outcome(s): S OT Recommendation Patient destination: Home Follow Up Recommendations: Home health OT;Outpatient OT;24 hour supervision/assistance Equipment  Recommended: To be determined   OT Evaluation Precautions/Restrictions  Precautions Precautions: Fall Precaution Comments: expressive > receptive aphasia, R inattention Restrictions Weight Bearing Restrictions: No  Pain Pain Assessment Pain Scale: Faces Pain Score: 0-No pain Home Living/Prior Functioning Home Living Family/patient expects to be discharged to:: Unsure Living Arrangements: Alone Available Help at Discharge: Family Type of Home: House Home Access: Stairs to enter [employer] of Steps: 2 Entrance Stairs-Rails: Right Home Layout: One level Bathroom Shower/Tub: Research officer, trade union  Toilet: Standard Bathroom Accessibility: Yes Additional Comments: hx taken from chart. pt with inconsistent yes/no with hom set up questions.  Lives With: Alone IADL History Homemaking Responsibilities: Yes Meal Prep Responsibility: Primary Laundry Responsibility: Primary Cleaning Responsibility: Primary Bill Paying/Finance Responsibility: Primary Shopping Responsibility: Primary IADL Comments: difficulty assessing due to aphasia; no family/caregivers present throughout evaluation Vision Baseline Vision/History: 0 No visual deficits Ability to See in Adequate Light: 0 Adequate Patient Visual Report: Other (comment) (unable to state) Vision Assessment?: Yes Eye Alignment: Within Functional Limits Ocular Range of Motion: Within Functional Limits Alignment/Gaze Preference: Within Defined Limits Tracking/Visual Pursuits: Decreased smoothness of horizontal tracking;Requires cues, head turns, or add eye shifts to track;Impaired - to be further tested in functional context Visual Fields: Right visual field deficit Depth Perception: Undershoots Perception  Perception: Impaired Inattention/Neglect: Does not attend to right visual field Praxis Praxis: Impaired Praxis Impairment Details: Perseveration Cognition Overall Cognitive Status: Difficult to assess (difficulty  assessing due to aphasia; no family/caregivers present throughout evaluation) Arousal/Alertness: Awake/alert Year: 2023 (able to point to correct answer when given 2 written options) Month: February Day of Week: Correct Memory:  (difficulty assessing due to aphasia; no family/caregivers present throughout evaluation) Immediate Memory Recall:  (unable to state due to aphasia) Memory Recall Sock:  (unable to state due to aphasia) Memory Recall Blue:  (unable to state due to aphasia) Memory Recall Bed:  (unable to state due to aphasia) Awareness: Impaired Awareness Impairment: Intellectual impairment Problem Solving: Impaired Problem Solving Impairment: Functional basic Safety/Judgment: Impaired Sensation Sensation Light Touch: Impaired Detail Light Touch Impaired Details: Impaired RUE;Impaired RLE Hot/Cold: Not tested Proprioception: Impaired by gross assessment Stereognosis: Impaired by gross assessment Additional Comments: poor attention to RUE even with tactile instruction for imrpoved attention to the RUE. Coordination Gross Motor Movements are Fluid and Coordinated: No Fine Motor Movements are Fluid and Coordinated: No Coordination and Movement Description: mild R sided hemiplegia Finger Nose Finger Test: unable to follow commands Heel Shin Test: unable to follow commands for heel to shin Motor  Motor Motor: Hemiplegia;Motor apraxia Motor - Skilled Clinical Observations: mild R sided hemiplegia. mild preservations  Trunk/Postural Assessment  Cervical Assessment Cervical Assessment: Exceptions to [facility] (forward head) Thoracic Assessment Thoracic Assessment: Exceptions to [facility] (mild rounded shoulders) Lumbar Assessment Lumbar Assessment: Exceptions to [facility] (anterior pelvic tilt in standing) Postural Control Postural Control: Within Functional Limits  Balance Balance Balance Assessed: Yes Static Sitting Balance Static Sitting - Balance Support: Feet supported;No upper  extremity supported Static Sitting - Level of Assistance: 5: Stand by assistance Dynamic Sitting Balance Dynamic Sitting - Balance Support: Feet supported;No upper extremity supported Dynamic Sitting - Level of Assistance: 5: Stand by assistance Static Standing Balance Static Standing - Balance Support: During functional activity;No upper extremity supported Static Standing - Level of Assistance: 5: Stand by assistance Dynamic Standing Balance Dynamic Standing - Balance Support: During functional activity;No upper extremity supported Dynamic Standing - Level of Assistance: 5: Stand by assistance Extremity/Trunk Assessment RUE Assessment RUE Assessment: Exceptions to [facility] General Strength Comments: unable to follow commands for MMT, mild hemiparesis and R inattention LUE Assessment LUE Assessment: Within Functional Limits General Strength Comments: grossly WFL for ADL assessed; unable to follow commands for MMT  Care Tool Care Tool Self Care Eating   Eating Assist Level: Minimal Assistance - Patient > 75%    Oral Care    Oral Care Assist Level: Contact Guard/Toucning assist    Bathing   Body parts bathed by patient: Buttocks  Assist Level: Contact Guard/Touching assist    Upper Body Dressing(including orthotics)   What is the patient wearing?: [place] only   Assist Level: Moderate Assistance - Patient 50 - 74%    Lower Body Dressing (excluding footwear)   What is the patient wearing?: Pants;Underwear/pull up Assist for lower body dressing: Minimal Assistance - Patient > 75%    Putting on/Taking off footwear   What is the patient wearing?: Non-skid slipper socks Assist for footwear: Supervision/Verbal cueing       Care Tool Toileting Toileting activity   Assist for toileting: Minimal Assistance - Patient > 75%     Care Tool Bed Mobility Roll left and right activity        Sit to lying activity        Lying to sitting on side of bed activity   Lying to  sitting on side of bed assist level: the ability to move from lying on the back to sitting on the side of the bed with no back support.: Supervision/Verbal cueing     Care Tool Transfers Sit to stand transfer   Sit to stand assist level: Contact Guard/Touching assist    Chair/bed transfer   Chair/bed transfer assist level: Contact Guard/Touching assist     Toilet transfer   Assist Level: Contact Guard/Touching assist     Care Tool Cognition  Expression of Ideas and Wants Expression of Ideas and Wants: 2. Frequent difficulty - frequently exhibits difficulty with expressing needs and ideas  Understanding Verbal and Non-Verbal Content Understanding Verbal and Non-Verbal Content: 2. Sometimes understands - understands only basic conversations or simple, direct phrases. Frequently requires cues to understand   Memory/Recall Ability Memory/Recall Ability : Current season   Refer to Care Plan for Long Term Goals  SHORT TERM GOAL WEEK 1 OT Short Term Goal 1 (Week 1): STG = LTG 2/2 ELOS  Recommendations for other services: None    Skilled Therapeutic Intervention ADL ADL Eating: Supervision/safety;Set up Where Assessed-Eating: Edge of bed;Bed level Grooming: Contact guard Where Assessed-Grooming: Standing at sink Upper Body Bathing: Supervision/safety Where Assessed-Upper Body Bathing: Edge of bed Lower Body Bathing: Minimal assistance Where Assessed-Lower Body Bathing: Edge of bed Upper Body Dressing: Minimal assistance;Moderate assistance Where Assessed-Upper Body Dressing: Edge of bed Lower Body Dressing: Minimal assistance Where Assessed-Lower Body Dressing: Edge of bed Toileting: Minimal assistance Where Assessed-Toileting: Bed level Toilet Transfer: Contact guard;Minimal assistance Toilet Transfer Method: Counselling psychologist: Energy manager: Not assessed Social research officer, government: Not assessed Mobility  Bed Mobility Bed Mobility: Supine  to Sit Supine to Sit: Supervision/Verbal cueing Transfers Sit to Stand: Contact Guard/Touching assist Stand to Sit: Contact Guard/Touching assist Session Note: Pt received semi-reclined in bed, appears agreeable to OT eval, no s/sx pain throughout session. Reviewed role of CIR OT, evaluation process, ADL/func mobility retraining, goals for therapy, and safety plan. Evaluation completed as documented above. Therapist exited to retrieve w/c, upon reentry pt seated EOB with RN present and noted to have been incontinent of b/b on floor. Extensive time to clean floor. When handed wash cloths, pt able to compelte posterior pericare with CGA in stance and cues for thoroughness. Doffed/donned socks with S, min A to thread BLE into brief/pants seated EOB. Mod A to don new hospital gown and manage sleeves. Ambulated > sink with CGA and no AD, assist for IV management. Completed oral care with CGA in stance , noted to have spilled water/tooth paste on floor with poor awareness. Completed ambulatory toilet  transfer with CGA to min A to guide turn. Pt oriented to date/time when given 2 written options and able to indicate correct year, month, day of week. Did not initiate using written yes/no sign when presented. Pt left seated EOB awaiting following SLP eval with bed alarm engaged, call bell in reach, and all immediate needs met.   Discharge Criteria: Patient will be discharged from OT if patient refuses treatment 3 consecutive times without medical reason, if treatment goals not met, if there is a change in medical status, if patient makes no progress towards goals or if patient is discharged from hospital.  The above assessment, treatment plan, treatment alternatives and goals were discussed and mutually agreed upon: No family available/patient unable  Volanda Napoleon MS, OTR/L  06/25/2021, 3:17 PM

## 2021-06-25 NOTE — Progress Notes (Signed)
Oncology Discharge Planning Note  [facility] Address: [address], [place], [place] [address] Hours of Operation:  Nena Polio, Monday - Friday  Clinic Contact Information:  [phone]) [phone]  Oncology Care Team: Medical Oncologist:  Derek Jack  Patient Details: Name:  Stanley Jimenez, Stanley Jimenez MRN:   [id number] DOB:   1956/05/09 Reason for Current Admission: Embolic cerebral infarction [facility])  Discharge Planning Narrative: Discharge follow-up appointments for oncology are current and available on the AVS and MyChart.   Upon discharge from the hospital, hematology/oncology's post discharge plan of care for the outpatient setting is: Port flush/Lab, treatment and OV with Dr. Delton Coombes.  MRI of brain was performed as IP.   Stanley Jimenez will be called within two business days after discharge to review hematology/oncology's plan of care for full understanding.    Outpatient Oncology Specific Care Only: Oncology appointment transportation needs addressed?:  not applicable Oncology medication management for symptom management addressed?:  not applicable Chemo Alert Card reviewed?:  not applicable Immunotherapy Alert Card reviewed?:  not applicable

## 2021-06-25 NOTE — Progress Notes (Signed)
Patient ID: Stanley Jimenez, male   DOB: 1955-11-08, 66 y.o.   MRN: [id number] Met with the patient to introduce self and review rehab process, team conference and plan of care. Patient with aphasia; difficult to carry conversation, so brief review of  brief review of situation, secondary risk management. Patient noting left side abd pain/soreness, LBM 06/25/21. Nurse aware of need to monitor hematuria; heparin drip continued. Continue to follow along to discharge to address education with patient and his brother to facilitate preparation for discharge. Margarito Liner

## 2021-06-25 NOTE — Plan of Care (Signed)
°  Problem: RH Swallowing Goal: LTG Patient will consume least restrictive diet using compensatory strategies with assistance (SLP) Description: LTG:  Patient will consume least restrictive diet using compensatory strategies with assistance (SLP) Flowsheets (Taken 06/25/2021 1918) LTG: Pt Patient will consume least restrictive diet using compensatory strategies with assistance of (SLP): Supervision   Problem: RH Comprehension Communication Goal: LTG Patient will comprehend basic/complex auditory (SLP) Description: LTG: Patient will comprehend basic/complex auditory information with cues (SLP). Flowsheets (Taken 06/25/2021 1918) LTG: Patient will comprehend auditory information with cueing (SLP):  Supervision  Minimal Assistance - Patient > 75%   Problem: RH Expression Communication Goal: LTG Patient will express needs/wants via multi-modal(SLP) Description: LTG:  Patient will express needs/wants via multi-modal communication (gestures/written, etc) with cues (SLP) Flowsheets (Taken 06/25/2021 1918) LTG: Patient will express needs/wants via multimodal communication (gestures/written, etc) with cueing (SLP): Moderate Assistance - Patient 50 - 74%

## 2021-06-25 NOTE — Progress Notes (Signed)
Occupational Therapy Session Note  Patient Details  Name: Stanley Jimenez MRN: [id number] Date of Birth: December 08, 1955  Today's Date: 06/25/2021 OT Individual Time: 1300-1330 OT Individual Time Calculation (min): 30 min    Short Term Goals: Week 1:  OT Short Term Goal 1 (Week 1): STG = LTG 2/2 ELOS  Skilled Therapeutic Interventions/Progress Updates:  Pt greeted up right in bed finishing lunch with NT present. Session focus on BADL reeducation, functional mobility and decreasing overall caregiver burden. Pt nodded "yes" when two name choices were provided. Pt completed supine>sit with CGA with pt exiting to R side of bed, CGA for sit<>stand to RW, CGA for ambulatory transfer to sink and toilet. Pt needed MIN  tactile cues to manage RW as pt running into obstacles on R side. Pt completing toileting with MIN A for clothing mgmt. Pt ambulated back to EOB with CGA with RW, lab entered to check haparin levels while OTA retrieved new gown as pt with pt ice cream on gown. Pt mostly non verbal during session but did state "fine" when asked how pt was feeling also nodding appropriately during session. Pt completed UB dressing with MIN A from EOB. Pt returned to supine with CGA.  pt left supine in bed with bed alarm activated and all needs within reach.                    Therapy Documentation Precautions:  Restrictions Weight Bearing Restrictions: No  Pain: no s/s of pain during session     Therapy/Group: Individual Therapy  Corinne Ports [facility] 06/25/2021, 1:32 PM

## 2021-06-25 NOTE — Progress Notes (Signed)
PROGRESS NOTE   Subjective/Complaints:  Exp aphasia, fair accuracy with Y/N questions, Nods no to [facility] and yes to [place] place of residence   ROS- cannot obtain  Objective:   US RENAL  Result Date: 06/24/2021 CLINICAL DATA:  Hematuria.  Prostate and esophageal carcinoma. EXAM: RENAL / URINARY TRACT ULTRASOUND COMPLETE COMPARISON:  Ultrasound-guided liver biopsy dated 02/06/2021. Chest, abdomen and pelvis CT dated 05/13/2021. FINDINGS: Right Kidney: Renal measurements: 9.7 x 6.3 x 5.3 cm. = volume: 168 mL. 1.5 cm cyst. Normal echotexture. No hydronephrosis. Left Kidney: Renal measurements: 10.0 x 5.5 x 4.8 cm = volume: 136 mL. 0.7 and 0.6 cm cysts. Normal echotexture. No hydronephrosis. Bladder: Appears normal for degree of bladder distention. Other: Previously demonstrated prostate radiation seed implants. Multiple liver masses demonstrated on the CT dated 05/13/2021. Small amount of free peritoneal fluid in all 4 quadrants of the abdomen. IMPRESSION: 1. Unremarkable urinary tract without hydronephrosis or visible calculi. 2. Previously demonstrated multiple liver metastases. 3. Small amount of ascites. Electronically Signed   By: Claudie Revering M.D.   On: 06/24/2021 13:25   Recent Labs    06/24/21 0419 06/25/21 0425  WBC 18.1* 18.5*  HGB 9.1* 9.1*  HCT 28.1* 27.0*  PLT 119* 150   Recent Labs    06/25/21 0425  NA 135  K 3.8  CL 104  CO2 21*  GLUCOSE 102*  BUN 12  CREATININE 0.89  CALCIUM 9.7    Intake/Output Summary (Last 24 hours) at 06/25/2021 0855 Last data filed at 06/25/2021 0615 Gross per 24 hour  Intake 200 ml  Output 100 ml  Net 100 ml     Pressure Ulcer 03/08/12 Stage II -  Partial thickness loss of dermis presenting as a shallow open ulcer with a red, pink wound bed without slough. 1.5 x 1.5 red and pink open area (Active)  03/08/12 2015  Location: Sacrum  Location Orientation: Upper  Staging: Stage  II -  Partial thickness loss of dermis presenting as a shallow open ulcer with a red, pink wound bed without slough.  Wound Description (Comments): 1.5 x 1.5 red and pink open area  Present on Admission: Yes    Physical Exam: Vital Signs Blood pressure 128/74, pulse 95, temperature 98.2 F (36.8 C), temperature source Oral, resp. rate 20, SpO2 100 %.  General: No acute distress Mood and affect are appropriate Heart: Regular rate and rhythm no rubs murmurs or extra sounds Lungs: Clear to auscultation, breathing unlabored, no rales or wheezes Abdomen: Positive bowel sounds, soft nontender to palpation, nondistended Extremities: No clubbing, cyanosis, or edema Skin: No evidence of breakdown, no evidence of rash Neurologic: Cranial nerves II through XII intact, motor strength is 5/5 in L and 4/5 deltoid, bicep, tricep, grip, hip flexor, knee extensors, ankle dorsiflexor and plantar flexor Sensory exam normal sensation to light touch and proprioception in bilateral upper and lower extremities Aphasia Exp> receptive, can follow simple commands point to bwhite board when asked his name  Musculoskeletal: Full range of motion in all 4 extremities. No joint swelling    Assessment/Plan: 1. Functional deficits which require 3+ hours per day of interdisciplinary therapy in a comprehensive inpatient rehab setting.  Physiatrist is providing close team supervision and 24 hour management of active medical problems listed below. Physiatrist and rehab team continue to assess barriers to discharge/monitor patient progress toward functional and medical goals  Care Tool:  Bathing        Body parts bathed by helper: Buttocks, Front perineal area     Bathing assist Assist Level: Total Assistance - Patient < 25%     Upper Body Dressing/Undressing Upper body dressing   What is the patient wearing?: Hospital gown only    Upper body assist Assist Level: Total Assistance - Patient < 25%    Lower Body  Dressing/Undressing Lower body dressing      What is the patient wearing?: Hospital gown only, Incontinence brief     Lower body assist Assist for lower body dressing: Total Assistance - Patient < 25%     Toileting Toileting    Toileting assist       Transfers Chair/bed transfer  Transfers assist           Locomotion Ambulation   Ambulation assist              Walk 10 feet activity   Assist           Walk 50 feet activity   Assist           Walk 150 feet activity   Assist           Walk 10 feet on uneven surface  activity   Assist           Wheelchair     Assist               Wheelchair 50 feet with 2 turns activity    Assist            Wheelchair 150 feet activity     Assist          Blood pressure 128/74, pulse 95, temperature 98.2 F (36.8 C), temperature source Oral, resp. rate 20, SpO2 100 %.  Medical Problem List and Plan: 1. 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  Antithrombotics: -DVT/anticoagulation:  Pharmaceutical: Coumadin bridging with heparin until INR therapeutic             -antiplatelet therapy: Presently on low-dose aspirin will be able to discontinue 3. Pain Management: Tylenol as needed 4. Mood: Provide emotional support             -antipsychotic agents: N/A 5. Neuropsych: This patient is not capable of making decisions on his own behalf. 6. Skin/Wound Care: Routine skin checks 7. Hyponatremia: repeat Na tomorrow.  8.  Acute thrombus right IJ.  Await plan on anticoagulation 9.  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  Not a candidate for intervention due to pre-existing metastatic cancer 10.  Thrombocytopenia/acute on chronic anemia/leukocytosis.  Follow-up CBC 11.  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  Follow-up chemotherapy as outpatient 12.  Prostate  cancer with radioactive seed implant.  Follow-up outpatient.  Continue Flomax 0.4 mg daily 13.  Hypertension.  Well controlled. Monitor with increased mobility.  Home medication Norvasc 10 mg daily prior to admission.  Resume as needed.  Vitals:   06/24/21 1957 06/25/21 0619  BP: (!) 143/68 128/74  Pulse: 84 95  Resp: 20   Temp: 98.2 F (36.8 C) 98.2 F (36.8 C)  SpO2: 100% 100%    14.  Paroxysmal SVT.  Cardiac rate controlled 15. Prediabetes: Hgb A1c 6.3 on 06/21/21- continue to monitor CBGs.     LOS: 1 days A FACE TO Rangely E Aryah Doering 06/25/2021, 8:55 AM

## 2021-06-25 NOTE — Plan of Care (Signed)
°  Problem: RH Balance Goal: LTG Patient will maintain dynamic standing with ADLs (OT) Description: LTG:  Patient will maintain dynamic standing balance with assist during activities of daily living (OT)  Flowsheets (Taken 06/25/2021 1503) LTG: Pt will maintain dynamic standing balance during ADLs with: Supervision/Verbal cueing   Problem: Sit to Stand Goal: LTG:  Patient will perform sit to stand in prep for activites of daily living with assistance level (OT) Description: LTG:  Patient will perform sit to stand in prep for activites of daily living with assistance level (OT) Flowsheets (Taken 06/25/2021 1503) LTG: PT will perform sit to stand in prep for activites of daily living with assistance level: Independent with assistive device   Problem: RH Grooming Goal: LTG Patient will perform grooming w/assist,cues/equip (OT) Description: LTG: Patient will perform grooming with assist, with/without cues using equipment (OT) Flowsheets (Taken 06/25/2021 1503) LTG: Pt will perform grooming with assistance level of: Independent with assistive device    Problem: RH Bathing Goal: LTG Patient will bathe all body parts with assist levels (OT) Description: LTG: Patient will bathe all body parts with assist levels (OT) Flowsheets (Taken 06/25/2021 1503) LTG: Pt will perform bathing with assistance level/cueing: Supervision/Verbal cueing   Problem: RH Dressing Goal: LTG Patient will perform upper body dressing (OT) Description: LTG Patient will perform upper body dressing with assist, with/without cues (OT). Flowsheets (Taken 06/25/2021 1503) LTG: Pt will perform upper body dressing with assistance level of: Independent Goal: LTG Patient will perform lower body dressing w/assist (OT) Description: LTG: Patient will perform lower body dressing with assist, with/without cues in positioning using equipment (OT) Flowsheets (Taken 06/25/2021 1503) LTG: Pt will perform lower body dressing with assistance level of:  Supervision/Verbal cueing   Problem: RH Toileting Goal: LTG Patient will perform toileting task (3/3 steps) with assistance level (OT) Description: LTG: Patient will perform toileting task (3/3 steps) with assistance level (OT)  Flowsheets (Taken 06/25/2021 1503) LTG: Pt will perform toileting task (3/3 steps) with assistance level: Supervision/Verbal cueing   Problem: RH Simple Meal Prep Goal: LTG Patient will perform simple meal prep w/assist (OT) Description: LTG: Patient will perform simple meal prep with assistance, with/without cues (OT). Flowsheets (Taken 06/25/2021 1503) LTG: Pt will perform simple meal prep with assistance level of: Supervision/Verbal cueing   Problem: RH Light Housekeeping Goal: LTG Patient will perform light housekeeping w/assist (OT) Description: LTG: Patient will perform light housekeeping with assistance, with/without cues (OT). Flowsheets (Taken 06/25/2021 1503) LTG: Pt will perform light housekeeping with assistance level of: Supervision/Verbal cueing   Problem: RH Toilet Transfers Goal: LTG Patient will perform toilet transfers w/assist (OT) Description: LTG: Patient will perform toilet transfers with assist, with/without cues using equipment (OT) Flowsheets (Taken 06/25/2021 1503) LTG: Pt will perform toilet transfers with assistance level of: Supervision/Verbal cueing   Problem: RH Awareness Goal: LTG: Patient will demonstrate awareness during functional activites type of (OT) Description: LTG: Patient will demonstrate awareness during functional activites type of (OT) Flowsheets (Taken 06/25/2021 1503) Patient will demonstrate awareness during functional activites type of: Intellectual LTG: Patient will demonstrate awareness during functional activites type of (OT): Moderate Assistance - Patient 50 - 74%

## 2021-06-25 NOTE — Evaluation (Signed)
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[facility])   Past Medical History:  Past Medical History:  Diagnosis Date   Alcohol abuse, in remission    SINCE 10- 2015   Anxiety    Arthritis    Cirrhosis, alcoholic ([place])    Clubbing of fingers    congenital   Depression    Full dentures    History of PSVT (paroxysmal supraventricular tachycardia)    run of non-sustatined VT 03-10-2015 in setting of alcohol withdrawal in hospital   History of seizure    03-08-2012  alcohol withdrawal   History of thrombocytopenia    10/ 2013  in setting of alcohol withdrawal   Hyperlipidemia    Hypertension    Port-A-Cath in place 02/20/2021   Prostate cancer [facility]) urologist-  dr dalhstedt/  oncologist- dr Tammi Klippel   T1c, Gleason 3+3,  PSA 8.89,  vol 27cc   Psoriasis, 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   Seizures ([place])    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   Past Surgical History:  Past Surgical History:  Procedure Laterality Date   BIOPSY  04/14/2016   Procedure: BIOPSY;  Surgeon: Rogene Houston, MD;  [facility];  Service: Endoscopy;;  esophagus   COLONOSCOPY N/A 04/14/2016   Procedure: COLONOSCOPY;  Surgeon: Rogene Houston, MD;  [facility];  Service: Endoscopy;  Laterality: N/A;   ESOPHAGOGASTRODUODENOSCOPY N/A 04/14/2016   Procedure: ESOPHAGOGASTRODUODENOSCOPY (EGD);  Surgeon: Rogene Houston, MD;  [facility];  Service: Endoscopy;  Laterality: N/A;  1:00   NO PAST SURGERIES     PORTACATH PLACEMENT Right 05/31/2016   Procedure:  INSERTION OF TUNNELED RIGHT INTERNAL JUGULAR BARD POWERPORT CENTRAL VENOUS CATHETER WITH SUBCUTANEOUS PORT;  Surgeon: Vickie Epley, MD;  Location: [facility];  Service: Vascular;  Laterality: Right;   RADIOACTIVE SEED IMPLANT N/A 09/20/2014   Procedure: RADIOACTIVE SEED IMPLANT;  Surgeon: Franchot Gallo, MD;  Location: [facility];  Service: Urology;  Laterality: N/A;    9   seeds implanted no seeds found in bladder   TRANSURETHRAL RESECTION OF PROSTATE      Assessment & Plan Clinical Impression: Patient is a 66 year old right-handed male with history of paroxysmal SVT, thrombocytopenia, hyperlipidemia, prostate cancer with radioactive seed implant 2016, history of static squamous cell carcinoma of esophagus currently undergoing chemotherapy, alcohol abuse currently in remission, hypertension, quit smoking 7 months ago.  Per chart review patient lives alone he has a brother with good support.  Presented 06/20/2021 with acute onset of aphasia.  Cranial CT scan negative for acute changes.  CT angiogram head and neck no emergent large vessel occlusion in the head.  High-grade approximately 80% in the proximal right ICA, severe stenosis proximal left V1 segment multifocal moderate to severe stenosis of the proximal left V2 segment.  Mixed plaque in the proximal left ICA resulting in approximate 30 to 40% stenosis.  Suspect thrombus in the right internal jugular vein above the level of the central venous catheter.  Extensive osseous metastatic disease.  MRI of the brain multiple  bilateral acute infarcts affecting multiple vascular territories the largest which located the left frontal operculum and left occipital lobe.  TCD bubble study did not indicate PFO.  Renal ultrasound unremarkable urinary tract without hydronephrosis.  Previously demonstrated multiple liver metastasis.  Small amount of ascites.  Echocardiogram with ejection fraction of 55 to 60% no wall motion abnormalities grade 1  diastolic dysfunction.  Admission chemistries unremarkable except sodium 134, WBC 24,000, platelets 96,000, hemoglobin 8.8, ammonia level 24, urine drug screen positive opiates..  Vascular surgery Dr. Servando Snare consulted in regards to high-grade stenosis right ICA patient asymptomatic not a good candidate for surgical intervention given underlying metastatic malignancy.  Initially placed on IV heparin and transitioning to Coumadin however patient developed hematuria and currently maintained on aspirin therapy and it was later decided that heparin could be resumed transitioning to Coumadin.  Hemoglobin remained stable 9.1 monitoring of leukocytosis improved from 24,000 and 18,100 and latest platelet count improved to 119,000.  Currently on a dysphagia #2 thin liquid diet.  Therapy evaluations completed due to patient decreased functional mobility and aphasia was admitted for a comprehensive rehab program.  Patient transferred to CIR on 06/24/2021 .   Patient currently requires min with mobility secondary to muscle weakness and muscle joint tightness, decreased cardiorespiratoy endurance, motor apraxia and decreased coordination, decreased attention to right, decreased attention, decreased awareness, decreased problem solving, decreased safety awareness, and delayed processing, and decreased sitting balance, decreased standing balance, decreased postural control, hemiplegia, decreased balance strategies, and difficulty maintaining precautions.  Prior to hospitalization, patient was 66 independent with mobility and lived with Alone in a House home.  Home access is 2Stairs to enter.  Patient will benefit from skilled PT intervention to maximize safe functional mobility, minimize fall risk, and decrease caregiver burden for planned discharge home with intermittent assist.  Anticipate patient will benefit from follow up [facility] at discharge.  PT - End of Session Activity Tolerance: Tolerates 10 - 20 min activity with  multiple rests Endurance Deficit: Yes PT Assessment Rehab Potential (ACUTE/IP ONLY): Good PT Barriers to Discharge: [place] home environment;Decreased caregiver support;Home environment access/layout;Medication compliance;Insurance for SNF coverage PT Patient demonstrates impairments in the following area(s): Balance;Behavior;Endurance;Motor;Perception;Safety;Sensory PT Transfers Functional Problem(s): Bed to Chair;Bed Mobility;Car;Furniture;Floor PT Locomotion Functional Problem(s): Ambulation;Wheelchair Mobility;Stairs PT Plan PT Intensity: Minimum of 1-2 x/day ,45 to 90 minutes PT Frequency: 5 out of 7 days PT Duration Estimated Length of Stay: 10-14 days PT Treatment/Interventions: Ambulation/gait training;Balance/vestibular training;Cognitive remediation/compensation;Disease management/prevention;Discharge planning;Community reintegration;DME/adaptive equipment instruction;Functional electrical stimulation;Functional mobility training;Patient/family education;Pain management;Neuromuscular re-education;Psychosocial support;Skin care/wound management;Splinting/orthotics;Therapeutic Exercise;Therapeutic Activities;UE/LE Strength taining/ROM;UE/LE Coordination activities;Stair training;Visual/perceptual remediation/compensation;Wheelchair propulsion/positioning PT Transfers Anticipated Outcome(s): mod I with LRAD PT Locomotion Anticipated Outcome(s): Supervision assist with LRAD PT Recommendation Follow Up Recommendations: Home health PT Patient destination: Home Equipment Recommended: To be determined   PT Evaluation Precautions/Restrictions Precautions Precautions: Fall Precaution Comments: expressive > receptive aphasia, R inattention Restrictions Weight Bearing Restrictions: No General   Vital SignsTherapy Vitals Temp: 98.2 F (36.8 C) Temp Source: Oral Pulse Rate: 94 Resp: 16 BP: 123/66 Patient Position (if appropriate): Lying Oxygen Therapy SpO2: 100 % O2 Device:  Room Air Pain Pain Assessment Pain Scale: Faces Pain Score: 0-No pain Pain Interference Pain Interference Pain Effect on Sleep: 8. Unable to answer Pain Interference with Therapy Activities: 8. Unable to answer Pain Interference with Day-to-Day Activities: 8. Unable to answer Home Living/Prior Functioning Home Living Living Arrangements: Alone Available Help at Discharge: Family Type of Home: House Home Access: Stairs to enter [employer] of  Steps: 2 Entrance Stairs-Rails: Right Home Layout: One level Bathroom Shower/Tub: Chiropodist: Standard Bathroom Accessibility: Yes Additional Comments: hx taken from chart. pt with inconsistent yes/no with hom set up questions.  Lives With: Alone Vision/Perception  Vision - History Ability to See in Adequate Light: 0 Adequate Vision - Assessment Eye Alignment: Within Functional Limits Ocular Range of Motion: Within Functional Limits Alignment/Gaze Preference: Within Defined Limits Tracking/Visual Pursuits: Decreased smoothness of horizontal tracking;Requires cues, head turns, or add eye shifts to track;Impaired - to be further tested in functional context Perception Perception: Impaired Inattention/Neglect: Does not attend to right visual field Praxis Praxis: Impaired Praxis Impairment Details: Perseveration  Cognition Overall Cognitive Status: Difficult to assess (difficulty assessing due to aphasia; no family/caregivers present throughout evaluation) Arousal/Alertness: Awake/alert Year: 2023 (able to point to correct answer when given 2 written options) Month: February Day of Week: Correct Memory:  (difficulty assessing due to aphasia; no family/caregivers present throughout evaluation) Immediate Memory Recall:  (unable to state due to aphasia) Memory Recall Sock:  (unable to state due to aphasia) Memory Recall Blue:  (unable to state due to aphasia) Memory Recall Bed:  (unable to state due to  aphasia) Awareness: Impaired Awareness Impairment: Intellectual impairment Problem Solving: Impaired Problem Solving Impairment: Functional basic Safety/Judgment: Impaired Sensation Sensation Light Touch: Impaired Detail Light Touch Impaired Details: Impaired RUE;Impaired RLE Hot/Cold: Not tested Proprioception: Impaired by gross assessment Stereognosis: Impaired by gross assessment Additional Comments: poor attention to RUE even with tactile instruction for imrpoved attention to the RUE. Coordination Gross Motor Movements are Fluid and Coordinated: No Fine Motor Movements are Fluid and Coordinated: No Coordination and Movement Description: mild R sided hemiplegia Finger Nose Finger Test: unable to follow commands Heel Shin Test: unable to follow commands for heel to shin Motor  Motor Motor: Hemiplegia;Motor apraxia Motor - Skilled Clinical Observations: mild R sided hemiplegia. mild preservations   Trunk/Postural Assessment  Cervical Assessment Cervical Assessment: Exceptions to [facility] (forward head) Thoracic Assessment Thoracic Assessment: Exceptions to [facility] (mild rounded shoulders) Lumbar Assessment Lumbar Assessment: Exceptions to [facility] (anterior pelvic tilt in standing) Postural Control Postural Control: Within Functional Limits  Balance Balance Balance Assessed: Yes Static Sitting Balance Static Sitting - Balance Support: Feet supported;No upper extremity supported Static Sitting - Level of Assistance: 5: Stand by assistance Dynamic Sitting Balance Dynamic Sitting - Balance Support: Feet supported;No upper extremity supported Dynamic Sitting - Level of Assistance: 5: Stand by assistance Static Standing Balance Static Standing - Balance Support: During functional activity;No upper extremity supported Static Standing - Level of Assistance: 5: Stand by assistance Dynamic Standing Balance Dynamic Standing - Balance Support: During functional activity;No upper extremity  supported Dynamic Standing - Level of Assistance: 5: Stand by assistance Extremity Assessment  RUE Assessment RUE Assessment: Exceptions to [facility] General Strength Comments: unable to follow commands for MMT, mild hemiparesis and R inattention LUE Assessment LUE Assessment: Within Functional Limits General Strength Comments: grossly WFL for ADL assessed; unable to follow commands for MMT RLE Assessment RLE Assessment: Exceptions to [facility] General Strength Comments: grossly 4/5 proximal to distal poor understanding of MMT LLE Assessment LLE Assessment: Exceptions to [facility] General Strength Comments: grossly 4+/5 proximal to distal poor understanding of MMT  Care Tool Care Tool Bed Mobility Roll left and right activity   Roll left and right assist level: Supervision/Verbal cueing    Sit to lying activity   Sit to lying assist level: Supervision/Verbal cueing    Lying to sitting on side of bed activity  Lying to sitting on side of bed assist level: the ability to move from lying on the back to sitting on the side of the bed with no back support.: Supervision/Verbal cueing     Care Tool Transfers Sit to stand transfer    Sit to stand assist level: Minimal Assistance - Patient > 75%    Chair/bed transfer   Chair/bed transfer assist level: Minimal Assistance - Patient > 75%     Physiological scientist transfer assist level: Minimal Assistance - Patient > 75%      Care Tool Locomotion Ambulation   Assist level: Minimal Assistance - Patient > 75% Assistive device: Walker-rolling Max distance: 160  Walk 10 feet activity   Assist level: Minimal Assistance - Patient > 75% Assistive device: Walker-rolling   Walk 50 feet with 2 turns activity     Assistive device: Walker-rolling  Walk 150 feet activity   Assist level: Minimal Assistance - Patient > 75% Assistive device: Walker-rolling  Walk 10 feet on uneven surfaces activity   Assist level: Minimal Assistance -  Patient > 75% Assistive device: Walker-rolling  Stairs   Assist level: Minimal Assistance - Patient > 75% Stairs assistive device: 2 hand rails Max number of stairs: 8  Walk up/down 1 step activity   Walk up/down 1 step (curb) assist level: Minimal Assistance - Patient > 75% Walk up/down 1 step or curb assistive device: 2 hand rails  Walk up/down 4 steps activity   Walk up/down 4 steps assist level: Minimal Assistance - Patient > 75% Walk up/down 4 steps assistive device: 2 hand rails  Walk up/down 12 steps activity Walk up/down 12 steps activity did not occur: Refused      Pick up small objects from floor   Pick up small object from the floor assist level: Moderate Assistance - Patient 50 - 74%    Wheelchair Is the patient using a wheelchair?: No   Wheelchair activity did not occur: N/A      Wheel 50 feet with 2 turns activity Wheelchair 50 feet with 2 turns activity did not occur: N/A    Wheel 150 feet activity Wheelchair 150 feet activity did not occur: N/A      Refer to Care Plan for Long Term Goals  SHORT TERM GOAL WEEK 1 PT Short Term Goal 1 (Week 1): Pt will transfer to [facility] with CGA PT Short Term Goal 2 (Week 1): Pt will ambulate 138ft with CGA and LRAD PT Short Term Goal 3 (Week 1): Pt will attend to RUE >50% of time with min cues PT Short Term Goal 4 (Week 1): Pt will complete Berg balance Scale  Recommendations for other services: None   Skilled Therapeutic Intervention Mobility Bed Mobility Bed Mobility: Supine to Sit Supine to Sit: Supervision/Verbal cueing Transfers Transfers: Sit to Stand;Stand to Sit;Stand Pivot Transfers Sit to Stand: Minimal Assistance - Patient > 75% Stand to Sit: Minimal Assistance - Patient > 75% Stand Pivot Transfers: Minimal Assistance - Patient > 75% Transfer (Assistive device): None Locomotion  Gait Ambulation: Yes Gait Assistance: Minimal Assistance - Patient > 75% Gait Distance (Feet): 150 Feet Assistive device: Rolling  walker Gait Gait: Yes Gait Pattern: Impaired Gait Pattern: Narrow base of support Stairs / Additional Locomotion Stairs: Yes Stairs Assistance: Minimal Assistance - Patient > 75% Stair Management Technique: Two rails Number of Stairs: 8 Height of Stairs: 6 Wheelchair Mobility Wheelchair Mobility: No  Pt received supine in  bed and agreeable to PT. Supine>sit transfer with supervision assist and cues for safety. PT instructed patient in PT Evaluation and initiated treatment intervention; see above for results. PT educated patient in [place], rehab potential, rehab goals, and discharge recommendations along with recommendation for follow-up rehabilitation services.   Pt performed 5 time sit<>stand (5xSTS): 21 sec (>15 sec indicates increased fall risk)   Attempted Berg balance scale, but pt unable to follow commands.   Stair management training with RW as listed above. Stair management training with UE Support on rails as listed. Car transfer training with RW and min assist for safety with cues for sit>pivot and no change in technique regardless of cues.   Attempted TUG, but pt unable to understand instruction.   Pt returned to room and performed stand pivot transfer to bed with no AD and min assist. Sit>supine completed with supervision assist for IV safety and left supine in bed with call bell in reach and all needs met.     Discharge Criteria: Patient will be discharged from PT if patient refuses treatment 3 consecutive times without medical reason, if treatment goals not met, if there is a change in medical status, if patient makes no progress towards goals or if patient is discharged from hospital.  The above assessment, treatment plan, treatment alternatives and goals were discussed and mutually agreed upon: by patient  [name] 06/25/2021, 10:56 PM

## 2021-06-25 NOTE — Evaluation (Addendum)
Speech Language Pathology Assessment and Plan ° °Patient Details  °Name: Stanley Jimenez °MRN: [id number] °Date of Birth: 02/15/1956 ° °SLP Diagnosis: Aphasia;Dysarthria;Speech and Language deficits;Dysphagia  °Rehab Potential: Good °ELOS: 10-14 days  ° °Today's Date: 06/25/2021 °SLP Individual Time: 0800-0900 °SLP Individual Time Calculation (min): 60 min ° °Hospital Problem: Principal Problem: °  Embolic cerebral infarction (HCC) ° °Past Medical History:  °Past Medical History:  °Diagnosis Date  ° Alcohol abuse, in remission   ° SINCE 10- 2015  ° Anxiety   ° Arthritis   ° Cirrhosis, alcoholic (HCC)   ° Clubbing of fingers   ° congenital  ° Depression   ° Full dentures   ° History of PSVT (paroxysmal supraventricular tachycardia)   ° run of non-sustatined VT 03-10-2015 in setting of alcohol withdrawal in hospital  ° History of seizure   ° 03-08-2012  alcohol withdrawal  ° History of thrombocytopenia   ° 10/ 2013  in setting of alcohol withdrawal  ° Hyperlipidemia   ° Hypertension   ° Port-A-Cath in place 02/20/2021  ° Prostate cancer (HCC) urologist-  dr dalhstedt/  oncologist- dr manning  ° T1c, Gleason 3+3,  PSA 8.89,  vol 27cc  ° Psoriasis, 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  ° Seizures (HCC)   ° 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  ° 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  ° °Past Surgical History:  °Past Surgical History:  °Procedure Laterality Date  ° BIOPSY  04/14/2016  ° Procedure: BIOPSY;  Surgeon: Najeeb U Rehman, MD;  Location: [facility];  Service: Endoscopy;;  esophagus  ° COLONOSCOPY N/A 04/14/2016  ° Procedure: COLONOSCOPY;  Surgeon: Najeeb U Rehman, MD;  Location: [facility];  Service: Endoscopy;  Laterality: N/A;  ° ESOPHAGOGASTRODUODENOSCOPY N/A 04/14/2016  ° Procedure: ESOPHAGOGASTRODUODENOSCOPY (EGD);  Surgeon: Najeeb U Rehman, MD;  Location: [facility];  Service: Endoscopy;  Laterality: N/A;  1:00  ° NO PAST SURGERIES    ° PORTACATH PLACEMENT Right 05/31/2016  °  Procedure: INSERTION OF TUNNELED RIGHT INTERNAL JUGULAR BARD POWERPORT CENTRAL VENOUS CATHETER WITH SUBCUTANEOUS PORT;  Surgeon: Jason Evan Davis, MD;  Location: [facility];  Service: Vascular;  Laterality: Right;  ° RADIOACTIVE SEED IMPLANT N/A 09/20/2014  ° Procedure: RADIOACTIVE SEED IMPLANT;  Surgeon: Stephen Dahlstedt, MD;  Location: [facility];  Service: Urology;  Laterality: N/A;    77   seeds implanted °no seeds found in bladder  ° TRANSURETHRAL RESECTION OF PROSTATE    ° ° °Assessment / Plan / Recommendation °Clinical Impression Stanley Jimenez is a 66-year-old right-handed male with history of paroxysmal SVT, thrombocytopenia, hyperlipidemia, prostate cancer with radioactive seed implant 2016, history of static squamous cell carcinoma of esophagus currently undergoing chemotherapy, alcohol abuse currently in remission, hypertension, quit smoking 7 months ago.  Per chart review patient lives alone he has a brother with good support.  Presented 06/20/2021 with acute onset of aphasia.  Cranial CT scan negative for acute changes. Extensive osseous metastatic disease.  MRI of the brain multiple bilateral acute infarcts affecting multiple vascular territories the largest which located the left frontal operculum and left occipital lobe. Vascular surgery Dr. Brandon Cain consulted in regards to high-grade stenosis right ICA patient asymptomatic not a good candidate for surgical intervention given underlying metastatic malignancy. Currently on a dysphagia #2 thin liquid diet. Therapy evaluations completed due to patient decreased functional mobility and aphasia was admitted for a comprehensive rehab program. Expressive aphasia appears worse than receptive. ° °  Patient presents with severe speech/language impairment impacting ability to communicate functional needs. Patient made several attempts to vocalize however speech was mostly unintelligible. Pt appeared aware of this and often shook his head with a  look of defeat. Difficult to determine if presenting with severe expressive language vs. dysarthria vs. apraxia of speech. Pt responded to biographical and environmental yes/no questions via head nods with 75% accuracy, followed 1-step directions with 60% accuracy, and 2-step with 20% accuracy. Suspected motor planning deficits. Pt unable to verbally count 1-10 but gestured with fingers with 75% accuracy. He was able to copy letters with average legibility when given written cue. He identified single words in field of 3 with 90% accuracy, and phrases in field of 2 with 100% accuracy. Pt pointed to field of 2 objects by name and then by feature with 100% accuracy. Pt appears stimulable for basic picture/word based communication board. Provided and placed on bedside table. Difficult to assess cognition at this time due to severity of communication deficits, however patient was oriented to all concepts with the exception of DOW via pointing to field of responses. He also displayed effective sustained attention with minimal redirection for duration of session.  ° °Patient seen for consumption of morning meal with dys 1/dys 2 textures and thin liquids. Pt exhibited prolonged mastication, mildly delayed oral transit, mild oral residue, and suspected swallow delay with dys 2 textures. Residue cleared with liquid rinse. Pt exhibited no overt s/s of aspiration with single and sequential sips of thin liquid via straw and cup, as well as with solid intake. Recommend continuation of of dysphagia 2 diet, thin liquids, crushed meds, and full supervision during meals. SLP to continue to assess for tolerance and possible diet progression.  ° °It should be noted following meal pt expressed "sh*t" and indicated his right side was in pain. At this time pt exhibited hiccups and burping. Pt with hx of esophageal cancer. Notified nurse.  ° °Patient would benefit from skilled SLP intervention to maximize speech/language and swallow  functioning and overall functional independence prior to discharge.  °  °Skilled Therapeutic Interventions          Pt participated in speech/language and clinical swallow evaluation. Please see above.  °SLP Assessment ° Patient will need skilled Speech Lanaguage Pathology Services during CIR admission  °  °Recommendations ° SLP Diet Recommendations: Dysphagia 2 (Fine chop);Thin °Liquid Administration via: Cup;Straw °Medication Administration: Crushed with puree °Supervision: Staff to assist with self feeding;Intermittent supervision to cue for compensatory strategies °Compensations: Minimize environmental distractions;Small sips/bites;Slow rate °Postural Changes and/or Swallow Maneuvers: Seated upright 90 degrees °Oral Care Recommendations: Oral care BID;Staff/trained caregiver to provide oral care °Patient destination: Home °Follow up Recommendations: 24 hour supervision/assistance;Home Health SLP °Equipment Recommended: To be determined  °  °SLP Frequency 3 to 5 out of 7 days   °SLP Duration ° °SLP Intensity ° °SLP Treatment/Interventions 10-14 days ° °Minumum of 1-2 x/day, 30 to 90 minutes ° °Environmental controls;Internal/external aids;Speech/Language facilitation;Cueing hierarchy;Dysphagia/aspiration precaution training;Functional tasks;Patient/family education;Therapeutic Activities;Multimodal communication approach   ° °Pain °  ° °Prior Functioning °Cognitive/Linguistic Baseline: Information not available °Type of Home: House ° Lives With: Alone °Available Help at Discharge: Family ° °SLP Evaluation °Cognition °Overall Cognitive Status: Difficult to assess °Arousal/Alertness: Awake/alert °Orientation Level: Oriented X4 °Year: 2023 °Month: February °Day of Week: Correct °Attention: Selective °Selective Attention: Impaired °Selective Attention Impairment: Functional basic °Memory:  (unable to assess due to speech/language deficits) °Awareness: Impaired °Awareness Impairment: Intellectual impairment °Problem  Solving: Impaired °Problem Solving   Impairment: Functional basic Safety/Judgment: Impaired  Comprehension Auditory Comprehension Overall Auditory Comprehension: Impaired Yes/No Questions: Impaired Basic Biographical Questions: 76-100% accurate Commands: Impaired One Step Basic Commands: 50-74% accurate EffectiveTechniques: Extra processing time;Visual/Gestural cues Reading Comprehension Reading Status: Impaired Word level: Within functional limits Sentence Level: Not tested Paragraph Level: Not tested Expression Expression Primary Mode of Expression: Verbal Verbal Expression Overall Verbal Expression: Impaired Initiation: Impaired Repetition: Impaired Level of Impairment: Word level Naming: Impairment Responsive: 0-25% accurate Confrontation: Impaired Pragmatics: Impairment Impairments: Eye contact Non-Verbal Means of Communication: Gestures Written Expression Written Expression: Exceptions to [facility] Trace Ability: Letter Oral Motor Oral Motor/Sensory Function Overall Oral Motor/Sensory Function: Mild impairment Lingual Strength: Reduced Motor Speech Overall Motor Speech: Other (comment) (difficult to assess; minimal output) Phonation: Other (comment);Wet;Low vocal intensity Articulation: Impaired Level of Impairment: Word Intelligibility: Intelligibility reduced Word: 0-24% accurate Phrase: 0-24% accurate Sentence: Not tested Conversation: Not tested Motor Planning: Impaired Level of Impairment: Word Motor Speech Errors: Aware  Care Tool Care Tool Cognition Ability to hear (with hearing aid or hearing appliances if normally used Ability to hear (with hearing aid or hearing appliances if normally used): 0. Adequate - no difficulty in normal conservation, social interaction, listening to TV   Expression of Ideas and Wants Expression of Ideas and Wants: 2. Frequent difficulty - frequently exhibits difficulty with expressing needs and ideas   Understanding Verbal and  Non-Verbal Content Understanding Verbal and Non-Verbal Content: 2. Sometimes understands - understands only basic conversations or simple, direct phrases. Frequently requires cues to understand  Memory/Recall Ability Memory/Recall Ability : Current season;That he or she is in a hospital/hospital unit   Intelligibility: Intelligibility reduced Word: 0-24% accurate Phrase: 0-24% accurate Sentence: Not tested Conversation: Not tested  Bedside Swallowing Assessment General Date of Onset: 06/20/21 Previous Swallow Assessment: BSE 06/21/21 Diet Prior to this Study: Dysphagia 2 (chopped);Thin liquids Temperature Spikes Noted: No Respiratory Status: Room air History of Recent Intubation: No Behavior/Cognition: Alert;Requires cueing;Cooperative Oral Cavity - Dentition: Edentulous Self-Feeding Abilities: Needs assist;Needs set up;Able to feed self Patient Positioning: Upright in bed Baseline Vocal Quality: Low vocal intensity Volitional Cough: Cognitively unable to elicit Volitional Swallow: Unable to elicit  Oral Care Assessment Does patient have any of the following "high(er) risk" factors?: None of the above Does patient have any of the following "at risk" factors?: None of the above Patient is LOW RISK: Follow universal precautions (see row information) Ice Chips Ice chips: Not tested Thin Liquid Thin Liquid: Impaired Presentation: Straw;Cup;Self Fed Pharyngeal  Phase Impairments: Suspected delayed Swallow Nectar Thick Nectar Thick Liquid: Not tested Honey Thick Honey Thick Liquid: Not tested Puree Puree: Within functional limits Presentation: Spoon Solid Solid: Impaired Presentation: Self Fed Oral Phase Impairments: Impaired mastication Oral Phase Functional Implications: Prolonged oral transit;Impaired mastication BSE Assessment Suspected Esophageal Findings Suspected Esophageal Findings: Belching Risk for Aspiration Impact on safety and function: Mild aspiration  risk Other Related Risk Factors: Cognitive impairment;Deconditioning;Decreased respiratory status;Other (comment) (hx of esophageal cancer)  Short Term Goals: Week 1: SLP Short Term Goal 1 (Week 1): Patient will express wants/needs via multimodal communication with mod-to-max A verbal/visual cues SLP Short Term Goal 2 (Week 1): Patient will use picture/word based communication board during 50% of opportunities with mod-to-max A verbal/visual cues SLP Short Term Goal 3 (Week 1): Patient will verbalize/approximate at the word level in 50% of opportunities with mod-to-max A multimodal cues SLP Short Term Goal 4 (Week 1): Patient will follow 1-2 step directions with 75% accuracy with min-to-mod A verbal/visual cues SLP Short  Term Goal 5 (Week 1): Patient will respond to basic yes/no questions regarding wants/needs in 90% of occasions with mod A verbal cues °SLP Short Term Goal 6 (Week 1): Patient will tolerate current diet with effective mastication, oral clearance, and minimal-to-no overt s/s of aspiration and min A cues before consideration of diet advancement. ° °Refer to Care Plan for Long Term Goals ° °Recommendations for other services: None  ° °Discharge Criteria: Patient will be discharged from SLP if patient refuses treatment 3 consecutive times without medical reason, if treatment goals not met, if there is a change in medical status, if patient makes no progress towards goals or if patient is discharged from hospital. ° °The above assessment, treatment plan, treatment alternatives and goals were discussed and mutually agreed upon: by patient ° °Brianne T Garretson °06/25/2021, 7:16 PM ° ° °

## 2021-06-25 NOTE — Progress Notes (Signed)
ANTICOAGULATION CONSULT NOTE- Follow-up  Pharmacy Consult for warfarin and heparin Indication: RIJ DVT, stroke   No Known Allergies  Patient Measurements:   Heparin Dosing Weight: 59 kg    Vital Signs: Temp: 98.2 F (36.8 C) (02/02 0619) Temp Source: Oral (02/02 0619) BP: 128/74 (02/02 0619) Pulse Rate: 95 (02/02 0619)  Labs: Recent Labs    06/23/21 0034 06/23/21 1025 06/24/21 0419 06/25/21 0425 06/25/21 1323  HGB 8.5*  --  9.1* 9.1*  --   HCT 25.2*  --  28.1* 27.0*  --   PLT 89*  --  119* 150  --   LABPROT 14.9  --  15.6* 15.5*  --   INR 1.2  --  1.2 1.2  --   HEPARINUNFRC 0.35 0.45  --  0.34 0.48  CREATININE  --   --   --  0.89  --      Estimated Creatinine Clearance: 67.9 mL/min (by C-G formula based on SCr of 0.89 mg/dL).     Assessment: Stanley Jimenez is a 66 y.o. 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 No anticoagulation prior to admission. INR on admission was 1.3. Pharmacy consulted for warfarin and heparin.  Anticoagulation was held 1/31 with new onset of hematuria.  MD has reviewed and is ok to resume anticoagulation at this time.   H/H, plt stable despite hematuria overnight. Note patient is also on aspirin 325 mg for stroke and PTA was on megestrol for cancer (currently held). AST slightly elevated, only eating 25%.   Heparin level was previously therapeutic on 1200 units/hr.  Will resume at this rate.  INR subtherapeutic.  Goal of Therapy:  INR 2- 3 Heparin level 0.3-0.5 units/mL Monitor platelets by anticoagulation protocol: Yes   Plan:  Continue heparin at 1200 units/hr Monitor daily INR, heparin level, and CBC Monitor for signs/symptoms of bleeding    Aza Dantes BS, PharmD, BCPS Clinical Pharmacist 06/25/2021 2:24 PM

## 2021-06-25 NOTE — Plan of Care (Signed)
°  Problem: RH Balance Goal: LTG Patient will maintain dynamic standing balance (PT) Description: LTG:  Patient will maintain dynamic standing balance with assistance during mobility activities (PT) Flowsheets (Taken 06/25/2021 2240) LTG: Pt will maintain dynamic standing balance during mobility activities with:: Supervision/Verbal cueing   Problem: RH Bed Mobility Goal: LTG Patient will perform bed mobility with assist (PT) Description: LTG: Patient will perform bed mobility with assistance, with/without cues (PT). Flowsheets (Taken 06/25/2021 2240) LTG: Pt will perform bed mobility with assistance level of: Independent   Problem: RH Bed to Chair Transfers Goal: LTG Patient will perform bed/chair transfers w/assist (PT) Description: LTG: Patient will perform bed to chair transfers with assistance (PT). Flowsheets (Taken 06/25/2021 2240) LTG: Pt will perform Bed to Chair Transfers with assistance level: Independent with assistive device    Problem: RH Car Transfers Goal: LTG Patient will perform car transfers with assist (PT) Description: LTG: Patient will perform car transfers with assistance (PT). Flowsheets (Taken 06/25/2021 2240) LTG: Pt will perform car transfers with assist:: Supervision/Verbal cueing   Problem: RH Furniture Transfers Goal: LTG Patient will perform furniture transfers w/assist (OT/PT) Description: LTG: Patient will perform furniture transfers  with assistance (OT/PT). Flowsheets (Taken 06/25/2021 2240) LTG: Pt will perform furniture transfers with assist:: Supervision/Verbal cueing   Problem: RH Ambulation Goal: LTG Patient will ambulate in controlled environment (PT) Description: LTG: Patient will ambulate in a controlled environment, # of feet with assistance (PT). Flowsheets (Taken 06/25/2021 2240) LTG: Pt will ambulate in controlled environ  assist needed:: Supervision/Verbal cueing LTG: Ambulation distance in controlled environment: 283ft with LRAD Goal: LTG Patient  will ambulate in home environment (PT) Description: LTG: Patient will ambulate in home environment, # of feet with assistance (PT). Flowsheets (Taken 06/25/2021 2240) LTG: Ambulation distance in home environment: 27ft with LRAD   Problem: RH Stairs Goal: LTG Patient will ambulate up and down stairs w/assist (PT) Description: LTG: Patient will ambulate up and down # of stairs with assistance (PT) Flowsheets (Taken 06/25/2021 2240) LTG: Pt will ambulate up/down stairs assist needed:: Supervision/Verbal cueing LTG: Pt will  ambulate up and down number of stairs: 12 steps with UE

## 2021-06-25 NOTE — Discharge Instructions (Addendum)
Inpatient Rehab Discharge Instructions  Stanley Jimenez Discharge date and time: No discharge date for patient encounter.   Activities/Precautions/ Functional Status: Activity: Hard cervical collar for comfort Diet: Dysphagia #2 thin liquids Wound Care: Routine skin checks Functional status:  ___ No restrictions     ___ Walk up steps independently ___ 24/7 supervision/assistance   ___ Walk up steps with assistance ___ Intermittent supervision/assistance  ___ Bathe/dress independently ___ Walk with walker     _x__ Bathe/dress with assistance ___ Walk Independently    ___ Shower independently ___ Walk with assistance    ___ Shower with assistance ___ No alcohol     ___ Return to work/school ________  COMMUNITY REFERRALS UPON DISCHARGE:    Home Health:   PT     OT     ST    RN                   Agency: [facility]  Phone: [phone]    Medical Equipment/Items Ordered: Vassie Moselle, Shower Chair, [facility]                                                  Agency/Supplier: [employer]   Special Instructions: No driving smoking or alcohol  Home health nurse to check INR 07/10/2021    results to Dr. Norwood Levo. Moshe Cipro [phone] FAX [phone]      STROKE/TIA DISCHARGE INSTRUCTIONS SMOKING Cigarette smoking nearly doubles your risk of having a stroke & is the single most alterable risk factor  If you smoke or have smoked in the last 12 months, you are advised to quit smoking for your health. Most of the excess cardiovascular risk related to smoking disappears within a year of stopping. Ask you doctor about anti-smoking medications [place] Quit Line: 1-800-QUIT NOW Free Smoking Cessation Classes [phone]  CHOLESTEROL Know your levels; limit fat & cholesterol in your diet  Lipid Panel     Component Value Date/Time   CHOL 143 06/21/2021 0222   CHOL 113 11/21/2020 0857   TRIG 79 06/21/2021 0222   HDL 57 06/21/2021 0222   HDL 49 11/21/2020 0857   CHOLHDL 2.5 06/21/2021 0222   VLDL 16  06/21/2021 0222   LDLCALC 70 06/21/2021 0222   LDLCALC 51 11/21/2020 0857   LDLCALC 108 (H) 04/09/2019 0839     Many patients benefit from treatment even if their cholesterol is at goal. Goal: Total Cholesterol (CHOL) less than 160 Goal:  Triglycerides (TRIG) less than 150 Goal:  HDL greater than 40 Goal:  LDL (LDLCALC) less than 100   BLOOD PRESSURE American Stroke Association blood pressure target is less that 120/80 mm/Hg  Your discharge blood pressure is:  BP: (!) 143/68 Monitor your blood pressure Limit your salt and alcohol intake Many individuals will require more than one medication for high blood pressure  DIABETES (A1c is a blood sugar average for last 3 months) Goal HGBA1c is under 7% (HBGA1c is blood sugar average for last 3 months)  Diabetes:   Lab Results  Component Value Date   HGBA1C 6.3 (H) 06/21/2021    Your HGBA1c can be lowered with medications, healthy diet, and exercise. Check your blood sugar as directed by your physician Call your physician if you experience unexplained or low blood sugars.  PHYSICAL ACTIVITY/REHABILITATION Goal is 30 minutes at least 4 days per week  Activity: Increase activity slowly, Therapies: Physical Therapy: Home Health Return to work:  Activity decreases your risk of heart attack and stroke and makes your heart stronger.  It helps control your weight and blood pressure; helps you relax and can improve your mood. Participate in a regular exercise program. Talk with your doctor about the best form of exercise for you (dancing, walking, swimming, cycling).  DIET/WEIGHT Goal is to maintain a healthy weight  Your discharge diet is:  Diet Order             DIET DYS 2 Room service appropriate? Yes; Fluid consistency: Thin  Diet effective now                   liquids Your height is:    Your current weight is:   Your Body Mass Index (BMI) is:    Following the type of diet specifically designed for you will help prevent another  stroke. Your goal weight range is:   Your goal Body Mass Index (BMI) is 19-24. Healthy food habits can help reduce 3 risk factors for stroke:  High cholesterol, hypertension, and excess weight.  RESOURCES Stroke/Support Group:  Call [phone]   STROKE EDUCATION PROVIDED/REVIEWED AND GIVEN TO PATIENT Stroke warning signs and symptoms How to activate emergency medical system (call 911). Medications prescribed at discharge. Need for follow-up after discharge. Personal risk factors for stroke. Pneumonia vaccine given: No Flu vaccine given: No My questions have been answered, the writing is legible, and I understand these instructions.  I will adhere to these goals & educational materials that have been provided to me after my discharge from the hospital.       My questions have been answered and I understand these instructions. I will adhere to these goals and the provided educational materials after my discharge from the hospital.  Patient/Caregiver Signature _______________________________ Date __________  Clinician Signature _______________________________________ Date __________  Please bring this form and your medication list with you to all your follow-up doctor's appointments.    Information on my medicine - Coumadin   (Warfarin)  This medication education was reviewed with me or my healthcare representative as part of my discharge preparation.  The pharmacist that spoke with me during my hospital stay was:    Why was Coumadin prescribed for you? Coumadin was prescribed for you because you have a blood clot or a medical condition that can cause an increased risk of forming blood clots. Blood clots can cause serious health problems by blocking the flow of blood to the heart, lung, or brain. Coumadin can prevent harmful blood clots from forming. As a reminder your indication for Coumadin is:  Deep Venous Thrombosis treatment/Stroke  What test will check on my response to  Coumadin? While on Coumadin (warfarin) you will need to have an INR test regularly to ensure that your dose is keeping you in the desired range. The INR (international normalized ratio) number is calculated from the result of the laboratory test called prothrombin time (PT).  If an INR APPOINTMENT HAS NOT ALREADY BEEN MADE FOR YOU please schedule an appointment to have this lab work done by your health care provider within 7 days. Your INR goal is usually a number between:  2 to 3 or your provider may give you a more narrow range like 2-2.5.  Ask your health care provider during an office visit what your goal INR is.  What  do you need to  know  About  COUMADIN? Take  Coumadin (warfarin) exactly as prescribed by your healthcare provider about the same time each day.  DO NOT stop taking without talking to the doctor who prescribed the medication.  Stopping without other blood clot prevention medication to take the place of Coumadin may increase your risk of developing a new clot or stroke.  Get refills before you run out.  What do you do if you miss a dose? If you miss a dose, take it as soon as you remember on the same day then continue your regularly scheduled regimen the next day.  Do not take two doses of Coumadin at the same time.  Important Safety Information A possible side effect of Coumadin (Warfarin) is an increased risk of bleeding. You should call your healthcare provider right away if you experience any of the following: Bleeding from an injury or your nose that does not stop. Unusual colored urine (red or dark brown) or unusual colored stools (red or black). Unusual bruising for unknown reasons. A serious fall or if you hit your head (even if there is no bleeding).  Some foods or medicines interact with Coumadin (warfarin) and might alter your response to warfarin. To help avoid this: Eat a balanced diet, maintaining a consistent amount of Vitamin K. Notify your provider about major  diet changes you plan to make. Avoid alcohol or limit your intake to 1 drink for women and 2 drinks for men per day. (1 drink is 5 oz. wine, 12 oz. beer, or 1.5 oz. liquor.)  Make sure that ANY health care provider who prescribes medication for you knows that you are taking Coumadin (warfarin).  Also make sure the healthcare provider who is monitoring your Coumadin knows when you have started a new medication including herbals and non-prescription products.  Coumadin (Warfarin)  Major Drug Interactions  Increased Warfarin Effect Decreased Warfarin Effect  Alcohol (large quantities) Antibiotics (esp. Septra/Bactrim, Flagyl, Cipro) Amiodarone (Cordarone) Aspirin (ASA) Cimetidine (Tagamet) Megestrol (Megace) NSAIDs (ibuprofen, naproxen, etc.) Piroxicam (Feldene) Propafenone (Rythmol SR) Propranolol (Inderal) Isoniazid (INH) Posaconazole (Noxafil) Barbiturates (Phenobarbital) Carbamazepine (Tegretol) Chlordiazepoxide (Librium) Cholestyramine (Questran) Griseofulvin Oral Contraceptives Rifampin Sucralfate (Carafate) Vitamin K   Coumadin (Warfarin) Major Herbal Interactions  Increased Warfarin Effect Decreased Warfarin Effect  Garlic Ginseng Ginkgo biloba Coenzyme Q10 Green tea St. Johns wort    Coumadin (Warfarin) FOOD Interactions  Eat a consistent number of servings per week of foods HIGH in Vitamin K (1 serving =  cup)  Collards (cooked, or boiled & drained) Kale (cooked, or boiled & drained) Mustard greens (cooked, or boiled & drained) Parsley *serving size only =  cup Spinach (cooked, or boiled & drained) Swiss chard (cooked, or boiled & drained) Turnip greens (cooked, or boiled & drained)  Eat a consistent number of servings per week of foods MEDIUM-HIGH in Vitamin K (1 serving = 1 cup)  Asparagus (cooked, or boiled & drained) Broccoli (cooked, boiled & drained, or raw & chopped) Brussel sprouts (cooked, or boiled & drained) *serving size only =   cup Lettuce, raw (green leaf, endive, romaine) Spinach, raw Turnip greens, raw & chopped   These websites have more information on Coumadin (warfarin):  FailFactory.se; VeganReport.com.au;

## 2021-06-25 NOTE — Progress Notes (Signed)
Inpatient Rehabilitation Center Individual Statement of Services  Patient Name:  Stanley Jimenez  Date:  06/25/2021  Welcome to the [facility].  Our goal is to provide you with an individualized program based on your diagnosis and situation, designed to meet your specific needs.  With this comprehensive rehabilitation program, you will be expected to participate in at least 3 hours of rehabilitation therapies Monday-Friday, with modified therapy programming on the weekends.  Your rehabilitation program will include the following services:  Physical Therapy (PT), Occupational Therapy (OT), Speech Therapy (ST), 24 hour per day rehabilitation nursing, Therapeutic Recreaction (TR), Neuropsychology, Care Coordinator, Rehabilitation Medicine, Nutrition Services, Pharmacy Services, and Other  Weekly team conferences will be held on Wednesdays to discuss your progress.  Your Inpatient Rehabilitation Care Coordinator will talk with you frequently to get your input and to update you on team discussions.  Team conferences with you and your family in attendance may also be held.  Expected length of stay: 7-10 Days  Overall anticipated outcome:  Supervision  Depending on your progress and recovery, your program may change. Your Inpatient Rehabilitation Care Coordinator will coordinate services and will keep you informed of any changes. Your Inpatient Rehabilitation Care Coordinator's name and contact numbers are listed  below.  The following services may also be recommended but are not provided by the [facility]:   [place] will be made to provide these services after discharge if needed.  Arrangements include referral to agencies that provide these services.  Your insurance has been verified to be:  [place] MEDICAID Your primary doctor is:  [name], MD  Pertinent information  will be shared with your doctor and your insurance company.  Inpatient Rehabilitation Care Coordinator:  Erlene Quan, [place] or [phone]  Information discussed with and copy given to patient by: Dyanne Iha, 06/25/2021, 2:35 PM

## 2021-06-25 NOTE — Progress Notes (Signed)
Inpatient Rehabilitation  Patient information reviewed and entered into eRehab system by Wing Gfeller M. Sharilynn Cassity, M.A., CCC/SLP, PPS Coordinator.  Information including medical coding, functional ability and quality indicators will be reviewed and updated through discharge.    

## 2021-06-26 ENCOUNTER — Ambulatory Visit (HOSPITAL_COMMUNITY)
Admission: RE | Admit: 2021-06-26 | Discharge: 2021-06-26 | Disposition: A | Discharge status: Home / Self Care | Payer: Medicaid Other | Source: Ambulatory Visit | Attending: Hematology | Admitting: Hematology

## 2021-06-26 LAB — PROTIME-INR
INR: 1.3 — ABNORMAL HIGH (ref 0.8–1.2)
Prothrombin Time: 16.1 seconds — ABNORMAL HIGH (ref 11.4–15.2)

## 2021-06-26 LAB — CBC
HCT: 26.5 % — ABNORMAL LOW (ref 39.0–52.0)
Hemoglobin: 8.6 g/dL — ABNORMAL LOW (ref 13.0–17.0)
MCH: 27.2 pg (ref 26.0–34.0)
MCHC: 32.5 g/dL (ref 30.0–36.0)
MCV: 83.9 fL (ref 80.0–100.0)
Platelets: 195 10*3/uL (ref 150–400)
RBC: 3.16 MIL/uL — ABNORMAL LOW (ref 4.22–5.81)
RDW: 22.5 % — ABNORMAL HIGH (ref 11.5–15.5)
WBC: 17.7 10*3/uL — ABNORMAL HIGH (ref 4.0–10.5)
nRBC: 0 % (ref 0.0–0.2)

## 2021-06-26 LAB — HEPARIN LEVEL (UNFRACTIONATED): Heparin Unfractionated: 0.36 IU/mL (ref 0.30–0.70)

## 2021-06-26 MED ORDER — WARFARIN SODIUM 4 MG PO TABS
4.0000 mg | ORAL_TABLET | Freq: Once | ORAL | Status: AC
  Administered 2021-06-26: 4 mg via ORAL
  Filled 2021-06-26: qty 1

## 2021-06-26 NOTE — Progress Notes (Signed)
Speech Language Pathology Daily Session Note  Patient Details  Name: Stanley Jimenez MRN: [id number] Date of Birth: [birth year]  Today's Date: 06/26/2021 SLP Individual Time: 1100-1200 SLP Individual Time Calculation (min): 60 min  Short Term Goals: Week 1: SLP Short Term Goal 1 (Week 1): Patient will express wants/needs via multimodal communication with mod-to-max A verbal/visual cues SLP Short Term Goal 2 (Week 1): Patient will use picture/word based communication board during 50% of opportunities with mod-to-max A verbal/visual cues SLP Short Term Goal 3 (Week 1): Patient will verbalize/approximate at the word level in 50% of opportunities with mod-to-max A multimodal cues SLP Short Term Goal 4 (Week 1): Patient will follow 1-2 step directions with 75% accuracy with min-to-mod A verbal/visual cues SLP Short Term Goal 5 (Week 1): Patient will respond to basic yes/no questions regarding wants/needs in 90% of occasions with mod A verbal cues SLP Short Term Goal 6 (Week 1): Patient will tolerate current diet with effective mastication, oral clearance, and minimal-to-no overt s/s of aspiration and min A cues before consideration of diet advancement.  Skilled Therapeutic Interventions: Skilled ST treatment focused on communication goals. Patient was supine in bed on arrival and independently made his way EOB for session. Pt remained alert and cooperative throughout session. Patient nodded his head "yes" when asked if he was in pain. When asked to identify location he pointed to left rib cage area. Pt identified pain as being 5/10 by pointing to visual number scale with min A cues. Nurse made aware and pain medications were provided during session. Pt without overt s/sx of aspiration with crushed meds and thin liquid straw sips. Yes/no responses in natural contexts appeared relatively accurate. Pt completed picture/word identification on picture communication board in vertical field of 6-7 with 70%  accuracy and min-to-mod A cues. Provided patient with visual pain scale as well as additional communication board with basic needs. Patient approximated the following phonemes with mod-to-max A verbal/visual/tactile cues: /b/, /p/, /d/, /g/; mod A for /m/, /w/; min A for /sh/, and vowels. Pt most intelligible with production of vowel sounds and /sh/. Spontaneous speech attempts throughout session were perceived as 0% intelligible. Pt known to overcome communication breakdowns with gestures on occasion. Patient was left in bed with alarm activated and immediate needs within reach at end of session. Continue per current plan of care.      Pain Pain Assessment Pain Scale: 0-10 Pain Score: 5  Pain Location: Rib cage Pain Orientation: Left Pain Descriptors / Indicators: Aching Pain Onset: Unable to tell Pain Intervention(s): RN made aware;Emotional support;Distraction  Therapy/Group: Individual Therapy  Billy Turvey T Brien Lowe 06/26/2021, 11:15 AM

## 2021-06-26 NOTE — Progress Notes (Signed)
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[facility])  Past Medical History:  Past Medical History:  Diagnosis Date   Alcohol abuse, in remission    SINCE 10- 2015   Anxiety    Arthritis    Cirrhosis, alcoholic ([place])    Clubbing of fingers    congenital   Depression    Full dentures    History of PSVT (paroxysmal supraventricular tachycardia)    run of non-sustatined VT 03-10-2015 in setting of alcohol withdrawal in hospital   History of seizure    03-08-2012  alcohol withdrawal   History of thrombocytopenia    10/ 2013  in setting of alcohol withdrawal   Hyperlipidemia    Hypertension    Port-A-Cath in place 02/20/2021   Prostate cancer [facility]) urologist-  dr dalhstedt/  oncologist- dr Tammi Klippel   T1c, Gleason 3+3,  PSA 8.89,  vol 27cc   Psoriasis, 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   Seizures ([place])    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   Past Surgical History:  Past Surgical History:  Procedure Laterality Date   BIOPSY  04/14/2016   Procedure: BIOPSY;  Surgeon: Rogene Houston, MD;  [facility];  Service: Endoscopy;;  esophagus   COLONOSCOPY N/A 04/14/2016   Procedure: COLONOSCOPY;  Surgeon: Rogene Houston, MD;  [facility]: [facility];  Service: Endoscopy;  Laterality: N/A;   ESOPHAGOGASTRODUODENOSCOPY N/A 04/14/2016   Procedure: ESOPHAGOGASTRODUODENOSCOPY (EGD);  Surgeon: Rogene Houston, MD;  Location: [facility];  Service: Endoscopy;  Laterality: N/A;  1:00   NO PAST SURGERIES     PORTACATH PLACEMENT Right 05/31/2016   Procedure: INSERTION OF TUNNELED RIGHT INTERNAL JUGULAR BARD POWERPORT CENTRAL VENOUS CATHETER WITH SUBCUTANEOUS PORT;  Surgeon: Vickie Epley, MD;  Location: [facility] OR[facility];  Service:  Vascular;  Laterality: Right;   RADIOACTIVE SEED IMPLANT N/A 09/20/2014   Procedure: RADIOACTIVE SEED IMPLANT;  Surgeon: Franchot Gallo, MD;  Location[facility];  Service: Urology;  Laterality: N/A;    77   seeds implanted no seeds found in bladder   TRANSURETHRAL RESECTION OF PROSTATE     Social History:  reports that he quit smoking about 7 months ago. His smoking use included cigarettes. He has a 10.00 pack-year smoking history. He quit smokeless tobacco use about 37 years ago.  His smokeless tobacco use included chew. He reports that he does not currently use alcohol. He reports that he does not currently use drugs.  Family / Support Systems Other Supports: [name] (Brother), Hilda Blades (sister), Donnie (Brother) Anticipated Caregiver: [name] Ability/Limitations of Caregiver: Min A Caregiver Availability: 24/7 Family Dynamics: support from siblings  Social History Preferred language: English Religion: Technical brewer - How often do you need to have someone help you when you read instructions, pamphlets, or other written material from your doctor or pharmacy?: Never Writes: Yes Legal History/Current Legal Issues: n/a Guardian/Conservator: n/a   Abuse/Neglect Abuse/Neglect Assessment Can Be Completed: Unable to assess, patient is non-responsive or altered mental status  Patient response to: Social Isolation - How often do you feel lonely or isolated from those around you?: Never  Emotional Status Recent Psychosocial Issues: hx of anxiety and depression Psychiatric History: n/a Substance Abuse History: quit smoking 7 years ago, positive urine screen, alochol  abuse  Patient / Family Perceptions, Expectations & Goals Pt/Family understanding of illness & functional limitations: yes Premorbid pt/family roles/activities: previously independent Anticipated changes in roles/activities/participation: will require supervision Pt/family expectations/goals:  supervision  [employer]: None Premorbid Home Care/DME Agencies: None Transportation available at discharge: family able to transport Is the patient able to respond to transportation needs?: No In the past 12 months, has lack of transportation kept you from medical appointments or from getting medications?: No In the past 12 months, has lack of transportation kept you from meetings, work, or from getting things needed for daily living?: No Resource referrals recommended: Neuropsychology  Discharge Planning Living Arrangements: Alone Support Systems: Other relatives Type of Residence: Private residence Insurance Resources: Multimedia programmer (specify) ([employer] MEDICAID) Financial Resources: Family Support Financial Screen Referred: No Living Expenses: Lives with family Money Management: Patient Does the patient have any problems obtaining your medications?: No Home Management: independent Patient/Family Preliminary Plans: family assisting Care Coordinator Barriers to Discharge: Insurance for SNF coverage, Decreased caregiver support, Lack of/limited family support, Neurogenic Bowel & Bladder Care Coordinator Anticipated Follow Up Needs: HH/OP Expected length of stay: 7-10Days  Clinical Impression Sw spoke with patient brother, introduced self explained role. Addressed questions and concerns. Pt brother concerned about pt slp currently. Pt brother will bring clothes in today.   Dyanne Iha 06/26/2021, 10:56 AM

## 2021-06-26 NOTE — Progress Notes (Signed)
PROGRESS NOTE   Subjective/Complaints:  Exp aphasia, fair accuracy with Y/N questions, just finished with PT, went over LUE restrictions   ROS- cannot obtain  Objective:   US RENAL  Result Date: 06/24/2021 CLINICAL DATA:  Hematuria.  Prostate and esophageal carcinoma. EXAM: RENAL / URINARY TRACT ULTRASOUND COMPLETE COMPARISON:  Ultrasound-guided liver biopsy dated 02/06/2021. Chest, abdomen and pelvis CT dated 05/13/2021. FINDINGS: Right Kidney: Renal measurements: 9.7 x 6.3 x 5.3 cm. = volume: 168 mL. 1.5 cm cyst. Normal echotexture. No hydronephrosis. Left Kidney: Renal measurements: 10.0 x 5.5 x 4.8 cm = volume: 136 mL. 0.7 and 0.6 cm cysts. Normal echotexture. No hydronephrosis. Bladder: Appears normal for degree of bladder distention. Other: Previously demonstrated prostate radiation seed implants. Multiple liver masses demonstrated on the CT dated 05/13/2021. Small amount of free peritoneal fluid in all 4 quadrants of the abdomen. IMPRESSION: 1. Unremarkable urinary tract without hydronephrosis or visible calculi. 2. Previously demonstrated multiple liver metastases. 3. Small amount of ascites. Electronically Signed   By: Claudie Revering M.D.   On: 06/24/2021 13:25   Recent Labs    06/25/21 0425 06/26/21 0512  WBC 18.5* 17.7*  HGB 9.1* 8.6*  HCT 27.0* 26.5*  PLT 150 195    Recent Labs    06/25/21 0425  NA 135  K 3.8  CL 104  CO2 21*  GLUCOSE 102*  BUN 12  CREATININE 0.89  CALCIUM 9.7     Intake/Output Summary (Last 24 hours) at 06/26/2021 0910 Last data filed at 06/26/2021 0451 Gross per 24 hour  Intake 346.23 ml  Output 200 ml  Net 146.23 ml          Physical Exam: Vital Signs Blood pressure 137/73, pulse 95, temperature 98.1 F (36.7 C), temperature source Oral, resp. rate 17, SpO2 98 %.  General: No acute distress Mood and affect are appropriate Heart: Regular rate and rhythm no rubs murmurs or extra  sounds Lungs: Clear to auscultation, breathing unlabored, no rales or wheezes Abdomen: Positive bowel sounds, soft nontender to palpation, nondistended Extremities: No clubbing, cyanosis, or edema Skin: No evidence of breakdown, no evidence of rash Neurologic: Cranial nerves II through XII intact, motor strength is 5/5 in L and 4/5 deltoid, bicep, tricep, grip, hip flexor, knee extensors, ankle dorsiflexor and plantar flexor Sensory exam normal sensation to light touch and proprioception in bilateral upper and lower extremities Aphasia Exp> receptive, can follow simple commands point to white board when asked his name  Musculoskeletal: Full range of motion in all 4 extremities. No joint swelling    Assessment/Plan: 1. Functional deficits which require 3+ hours per day of interdisciplinary therapy in a comprehensive inpatient rehab setting. Physiatrist is providing close team supervision and 24 hour management of active medical problems listed below. Physiatrist and rehab team continue to assess barriers to discharge/monitor patient progress toward functional and medical goals  Care Tool:  Bathing    Body parts bathed by patient: Buttocks   Body parts bathed by helper: Buttocks, Front perineal area Body parts n/a: Right arm, Left arm, Chest, Abdomen, Right upper leg, Left upper leg, Right lower leg, Left lower leg, Front perineal area, Buttocks   Bathing  assist Assist Level: Maximal Assistance - Patient 24 - 49%     Upper Body Dressing/Undressing Upper body dressing   What is the patient wearing?: Hospital gown only    Upper body assist Assist Level: Minimal Assistance - Patient > 75%    Lower Body Dressing/Undressing Lower body dressing      What is the patient wearing?: Incontinence brief     Lower body assist Assist for lower body dressing: Minimal Assistance - Patient > 75%     Toileting Toileting    Toileting assist Assist for toileting: Minimal Assistance -  Patient > 75%     Transfers Chair/bed transfer  Transfers assist     Chair/bed transfer assist level: Minimal Assistance - Patient > 75%     Locomotion Ambulation   Ambulation assist      Assist level: Minimal Assistance - Patient > 75% Assistive device: Walker-rolling Max distance: 160   Walk 10 feet activity   Assist     Assist level: Minimal Assistance - Patient > 75% Assistive device: Walker-rolling   Walk 50 feet activity   Assist      Assistive device: Walker-rolling    Walk 150 feet activity   Assist    Assist level: Minimal Assistance - Patient > 75% Assistive device: Walker-rolling    Walk 10 feet on uneven surface  activity   Assist     Assist level: Minimal Assistance - Patient > 75% Assistive device: Chemical engineer     Assist Is the patient using a wheelchair?: No   Wheelchair activity did not occur: N/A         Wheelchair 50 feet with 2 turns activity    Assist    Wheelchair 50 feet with 2 turns activity did not occur: N/A       Wheelchair 150 feet activity     Assist  Wheelchair 150 feet activity did not occur: N/A       Blood pressure 137/73, pulse 95, temperature 98.1 F (36.7 C), temperature source Oral, resp. rate 17, SpO2 98 %.  Medical Problem List and Plan: 1. 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  Antithrombotics: -DVT/anticoagulation:  Pharmaceutical: Coumadin bridging with heparin until INR therapeutic             -antiplatelet therapy: Presently on low-dose aspirin will be able to discontinue 3. Pain Management: Tylenol as needed 4.  Mood: Provide emotional support             -antipsychotic agents: N/A 5. Neuropsych: This patient is not capable of making decisions on his own behalf. 6. Skin/Wound Care: Routine skin checks 7. Hyponatremia: repeat Na tomorrow.  8.  Acute thrombus right IJ.  anticoagulation on IV heparin , until INR >2.0, 2/3 INR 1.3 9.  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  Not a candidate for intervention due to pre-existing metastatic cancer 10.  Thrombocytopenia/acute on chronic anemia/leukocytosis.  Follow-up CBC 11.  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  Hepatic and bone mets  Follow-up Dr.Katragadda.  Follow-up chemotherapy as outpatient 12.  Prostate cancer with radioactive seed implant.  Follow-up outpatient.  Continue Flomax 0.4 mg daily 13.  Hypertension.  Well controlled. Monitor with increased mobility.  Home medication Norvasc 10 mg daily prior to admission.  Resume as needed.  Vitals:   06/25/21 1953 06/26/21 0340  BP: 124/68 137/73  Pulse: 97 95  Resp: 16 17  Temp: 97.9 F (36.6 C) 98.1 F (36.7 C)  SpO2: 100% 98%  Still no need for amlodipine 2/3  14.  Paroxysmal SVT.  Cardiac rate controlled 15. Prediabetes: Hgb A1c 6.3 on 06/21/21- continue to monitor CBGs.     LOS: 2 days A FACE TO Rattan E Shaday Rayborn 06/26/2021, 9:10 AM

## 2021-06-26 NOTE — Progress Notes (Signed)
ANTICOAGULATION CONSULT NOTE- Follow-up  Pharmacy Consult for warfarin and heparin Indication: RIJ DVT, stroke   No Known Allergies  Patient Measurements:   Heparin Dosing Weight: 59 kg    Vital Signs: Temp: 98.1 F (36.7 C) (02/03 0340) Temp Source: Oral (02/03 0340) BP: 137/73 (02/03 0340) Pulse Rate: 95 (02/03 0340)  Labs: Recent Labs    06/24/21 0419 06/25/21 0425 06/25/21 1323 06/26/21 0512  HGB 9.1* 9.1*  --  8.6*  HCT 28.1* 27.0*  --  26.5*  PLT 119* 150  --  195  LABPROT 15.6* 15.5*  --  16.1*  INR 1.2 1.2  --  1.3*  HEPARINUNFRC  --  0.34 0.48 0.36  CREATININE  --  0.89  --   --      Estimated Creatinine Clearance: 67.9 mL/min (by C-G formula based on SCr of 0.89 mg/dL).     Assessment: Stanley Jimenez is a 65 y.o. 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 No anticoagulation prior to admission. INR on admission was 1.3. Pharmacy consulted for warfarin and heparin.  Anticoagulation was held 1/31 with new onset of hematuria.  MD has reviewed and is ok to resume anticoagulation at this time.   H/H, plt stable despite hematuria overnight. Note patient is also on aspirin 325 mg for stroke and PTA was on megestrol for cancer (currently held). AST slightly elevated, only eating 25%.   Heparin levelis 0.36 INR is subtherapeutic ta 1.3.  Goal of Therapy:  INR 2- 3 Heparin level 0.3-0.5 units/mL Monitor platelets by anticoagulation protocol: Yes   Plan:  Continue heparin at 1200 units/hr Warfarin 4 mg po X1 today Monitor daily INR, heparin level, and CBC Monitor for signs/symptoms of bleeding    Stanley Jimenez BS, PharmD, BCPS Clinical Pharmacist 06/26/2021 7:53 AM

## 2021-06-26 NOTE — Progress Notes (Signed)
Occupational Therapy Session Note  Patient Details  Name: Stanley Jimenez MRN: [id number] Date of Birth: 07/17/1955  Today's Date: 06/26/2021 OT Individual Time: 1017-5102 OT Individual Time Calculation (min): 53 min    Short Term Goals: Week 1:  OT Short Term Goal 1 (Week 1): STG = LTG 2/2 ELOS  Skilled Therapeutic Interventions/Progress Updates:    Pt received awake semi-reclined in bed appears agreeable to therapy. Session focus on self-care retraining, activity tolerance, R visual scanning, self-feeding in prep for improved ADL/IADL/func mobility performance + decreased caregiver burden. Completed bed mobility throughout session with only cues for initiation. Seated EOB donned B teds with max A, able to pull up to knees to assist. Able to don/doff gripper socks with S, as well. Donned pants with light CGA for sit to stand.   Self-fed ~20% of breakfast with S and cues for swallowing strategies per SLP sheet, although noted poor comprehension of cuing. Intermittent cues to locate water on his R. Noted to use utensil in his dominant R hand, but often relying on finger feeding with L. Pt with episode of hiccups, requiring extended time. Did point to "hurt" on communication board when presented. Pt becoming extensively frustrated with breakdown in communication, unable to initiate writing on paper and did not select any picture/word on communication board. Adamantly shaking head no at therapist suggestions for needs/wants.  Pt returning to side-lying with independence.  Pt left side-lying in bed with bed alarm engaged, call bell in reach, and all immediate needs met.    Therapy Documentation Precautions:  Precautions Precautions: Fall Precaution Comments: expressive > receptive aphasia, R inattention Restrictions Weight Bearing Restrictions: No  Pain: no s/sx throughout, one episode of hiccups   ADL: See Care Tool for more details.   Therapy/Group: Individual Therapy  Volanda Napoleon MS, OTR/L  06/26/2021, 6:48 AM

## 2021-06-26 NOTE — Progress Notes (Signed)
Physical Therapy Session Note  Patient Details  Name: Stanley Jimenez MRN: [id number] Date of Birth: 1956-02-23  Today's Date: 06/26/2021 PT Individual Time: 0805-0905 1030-1100 PT Individual Time Calculation (min): 60 min and 30 min   Short Term Goals: Week 1:  PT Short Term Goal 1 (Week 1): Pt will transfer to [facility] with CGA PT Short Term Goal 2 (Week 1): Pt will ambulate 140f with CGA and LRAD PT Short Term Goal 3 (Week 1): Pt will attend to RUE >50% of time with min cues PT Short Term Goal 4 (Week 1): Pt will complete Berg balance Scale Week 2:    Week 3:     Skilled Therapeutic Interventions/Progress Updates:  Session 1  Pt received supine in bed and agreeable to PT. Supine>sit transfer with cues for safety and IV line management. PT performed clothing management to improve safety of IV line through shirt sleeve once sitting EOB. Pt [facility] and mild R inattention   PT instructed pt in TUG: 20.3 sec (average of 3 trials 21, 20 20,  ; >13.5 sec indicates increased fall risk) Attempted TUG on 2/2, but pt unable to follow instruction. Increased visual cues for target distance and demonstration.    PT instructed pt in DGI. See below for results. Demonstrates increased fall risk with score of 14/24. (<19 indicates increased fall risk) significant visual cues for completion of test. Supervision assist for all with IV pole management by PT  Visual scanning task for ball catch seated in WC. Pt able to catch 90% of trials on the L and only 50% beyond midline on the R>  Ball toss off wall with min assist for safety and max cues fotr increased force to allow ball to bounce back to hands. Pt demonstrated no improvement in technique regardless of instruction picking ball up from floor on 75% of tosses with min assist for safety.   Pt returned to room and performed stand pivot transfer to bed with supervision assist. Sit>supine completed with  supervision assist for safety.  Pt  left supine in bed with call bell in reach and all needs met.    Session 2.   Pt received supine in bed and agreeable to PT. Supine>sit transfer with supervision assist for safety. Pt motions to bathroom. Performed urination at toilet with supervision assist for balance.   Gait training in hall with no AD, supervision assist for safety and cues for navigation of obstacles on the R 2 x 1250f   Dynamic balance  staindg on blue wedge while completing visual scanning task to place and remove clothed pins from horizontal bars on the R visual field x 12 RUE and x 12 LUE .   Pt returned to room and performed ambulatory  transfer to bed with no AD and supervision assist. Sit>supine completed with supervision assist, and left supine in bed with call bell in reach and all needs met.       Therapy Documentation Precautions:  Precautions Precautions: Fall Precaution Comments: expressive > receptive aphasia, R inattention Restrictions Weight Bearing Restrictions: No General: PT Amount of Missed Time (min): 15 Minutes PT Missed Treatment Reason: Patient fatigue    Pain: Pain Assessment Pain Scale: 0-10 Pain Score: 0-No pain     Balance: Standardized Balance Assessment Standardized Balance Assessment: Dynamic Gait Index Dynamic Gait Index Level Surface: Mild Impairment Change in Gait Speed: Moderate Impairment Gait with Horizontal Head Turns: Moderate Impairment Gait with Vertical Head  Turns: Mild Impairment Gait and Pivot Turn: Mild Impairment Step Over Obstacle: Mild Impairment Step Around Obstacles: Mild Impairment Steps: Mild Impairment Total Score: 14  TUG: 21 sec     Therapy/Group: Individual Therapy  [name] 06/26/2021, 9:20 AM

## 2021-06-27 LAB — PROTIME-INR
INR: 1.4 — ABNORMAL HIGH (ref 0.8–1.2)
Prothrombin Time: 17.2 seconds — ABNORMAL HIGH (ref 11.4–15.2)

## 2021-06-27 LAB — CBC
HCT: 27.1 % — ABNORMAL LOW (ref 39.0–52.0)
Hemoglobin: 9 g/dL — ABNORMAL LOW (ref 13.0–17.0)
MCH: 28 pg (ref 26.0–34.0)
MCHC: 33.2 g/dL (ref 30.0–36.0)
MCV: 84.2 fL (ref 80.0–100.0)
Platelets: 244 10*3/uL (ref 150–400)
RBC: 3.22 MIL/uL — ABNORMAL LOW (ref 4.22–5.81)
RDW: 22.5 % — ABNORMAL HIGH (ref 11.5–15.5)
WBC: 17.3 10*3/uL — ABNORMAL HIGH (ref 4.0–10.5)
nRBC: 0 % (ref 0.0–0.2)

## 2021-06-27 LAB — HEPARIN LEVEL (UNFRACTIONATED): Heparin Unfractionated: 0.43 IU/mL (ref 0.30–0.70)

## 2021-06-27 MED ORDER — CARVEDILOL 3.125 MG PO TABS
3.1250 mg | ORAL_TABLET | Freq: Two times a day (BID) | ORAL | Status: DC
  Administered 2021-06-27 – 2021-07-01 (×8): 3.125 mg via ORAL
  Filled 2021-06-27 (×8): qty 1

## 2021-06-27 MED ORDER — WARFARIN SODIUM 4 MG PO TABS
4.0000 mg | ORAL_TABLET | Freq: Once | ORAL | Status: AC
Start: 2021-06-27 — End: 2021-06-27
  Administered 2021-06-27: 4 mg via ORAL
  Filled 2021-06-27: qty 1

## 2021-06-27 NOTE — Progress Notes (Signed)
Speech Language Pathology Daily Session Note  Patient Details  Name: Stanley Jimenez MRN: [id number] Date of Birth: Feb 09, 1956  Today's Date: 06/27/2021 SLP Individual Time: 6546-5035 SLP Individual Time Calculation (min): 45 min  Short Term Goals: Week 1: SLP Short Term Goal 1 (Week 1): Patient will express wants/needs via multimodal communication with mod-to-max A verbal/visual cues SLP Short Term Goal 2 (Week 1): Patient will use picture/word based communication board during 50% of opportunities with mod-to-max A verbal/visual cues SLP Short Term Goal 3 (Week 1): Patient will verbalize/approximate at the word level in 50% of opportunities with mod-to-max A multimodal cues SLP Short Term Goal 4 (Week 1): Patient will follow 1-2 step directions with 75% accuracy with min-to-mod A verbal/visual cues SLP Short Term Goal 5 (Week 1): Patient will respond to basic yes/no questions regarding wants/needs in 90% of occasions with mod A verbal cues SLP Short Term Goal 6 (Week 1): Patient will tolerate current diet with effective mastication, oral clearance, and minimal-to-no overt s/s of aspiration and min A cues before consideration of diet advancement.  Skilled Therapeutic Interventions: Skilled ST treatment focused on communication goals with initial focus being speech production. Patient produced the following phonemes in isolation with approximately 75% intelligibility with max A multimodal cues: /m/, /b/, /p/, /d/, and /sh/. Pt was able to produce these same phonemes in word initial position at word level followed by "oh" vowel with 75-90% intelligibility (e.g., moe, show, bow, etc). Pt with increased difficulty producing with other vowels, as well as production of /w/, /t/, /l/, /k/ and /s/ phonemes. Pt most successful with production of bilabial sounds. Pt expressed "yeah" and "no" with ~75% intelligiblity however used inconsistently in response to yes/no questions (i.e., Pt more likely to say "no"  while nodding his head yes.) Pt made attempts to verbalize at word level but unfortunately resulted in unintelligible perseverations. SLP prompted pt to attempt to write response and patient successfully wrote the letter "m" followed by additional writing that was not legible. SLP facilitated picture identification via communication board with picture and written title. Pt achieved 25% accuracy in field of 8 and max A verbal (word, semantic cue, description) cues, 50% accuracy in field of 4 with sup A cues, progressing to 87% accuracy with mod A verbal (word, semantic) cues. Patient also utilized vertical pain/faces scale per various descriptions ("point to severe pain") with 75% accuracy. SLP provided education on speech/language deficits s/p CVA with emphasis on aphasia, apraxia, and dysarthria. Pt nodded his head throughout. Patient was left in bed with alarm activated and immediate needs within reach at end of session. Continue per current plan of care.      Pain Pain Assessment Pain Scale: 0-10 Pain Score: 0-No pain Faces Pain Scale: No hurt  Therapy/Group: Individual Therapy  Patty Sermons 06/27/2021, 12:26 PM

## 2021-06-27 NOTE — IPOC Note (Signed)
Overall Plan of Care [facility]) Patient Details Name: Stanley Jimenez MRN: [id number] DOB: 05/16/1956  Admitting Diagnosis: Embolic cerebral infarction [facility])  Hospital Problems: Principal Problem:   Embolic cerebral infarction [facility])     Functional Problem List: Nursing Medication Management, Safety, Endurance, Nutrition  PT Balance, Behavior, Endurance, Motor, Perception, Safety, Sensory  OT Balance, Cognition, Endurance, Motor, Perception, Safety, Vision  SLP Linguistic  TR         Basic ADLs: OT Grooming, Bathing, Eating, Dressing, Toileting     Advanced  ADLs: OT Simple Meal Preparation, Light Housekeeping     Transfers: PT Bed to Chair, Bed Mobility, Car, Furniture, Floor  OT Toilet, Tub/Shower     Locomotion: PT Ambulation, Emergency planning/management officer, Stairs     Additional Impairments: OT Fuctional Use of Upper Extremity  SLP Swallowing, Communication expression, comprehension    TR      Anticipated Outcomes Item Anticipated Outcome  Self Feeding mod I  Swallowing  sup A   Basic self-care  S  Toileting  S   Bathroom Transfers S  Bowel/Bladder  manage bowel w mod I assist  Transfers  mod I with LRAD  Locomotion  Supervision assist with LRAD  Communication  sup-to-min A comp; mod A expression  Cognition     Pain  N/A  Safety/Judgment  maintain w cues   Therapy Plan: PT Intensity: Minimum of 1-2 x/day ,45 to 90 minutes PT Frequency: 5 out of 7 days PT Duration Estimated Length of Stay: 10-14 days OT Intensity: Minimum of 1-2 x/day, 45 to 90 minutes OT Frequency: 5 out of 7 days OT Duration/Estimated Length of Stay: 5-7 days SLP Intensity: Minumum of 1-2 x/day, 30 to 90 minutes SLP Frequency: 3 to 5 out of 7 days SLP Duration/Estimated Length of Stay: 10-14 days   Due to the current state of emergency, patients may not be receiving their 3-hours of Medicare-mandated therapy.   Team Interventions: Nursing Interventions Disease  Management/Prevention, Medication Management, Discharge Planning, Dysphagia/Aspiration Precaution Training, Bowel Management, Patient/Family Education  PT interventions Ambulation/gait training, Training and development officer, Cognitive remediation/compensation, Disease management/prevention, Discharge planning, Community reintegration, DME/adaptive equipment instruction, Functional electrical stimulation, Functional mobility training, Patient/family education, Pain management, Neuromuscular re-education, Psychosocial support, Skin care/wound management, Splinting/orthotics, Therapeutic Exercise, Therapeutic Activities, UE/LE Strength taining/ROM, UE/LE Coordination activities, Stair training, Visual/perceptual remediation/compensation, Wheelchair propulsion/positioning  OT Interventions Balance/vestibular training, Disease mangement/prevention, Neuromuscular re-education, Self Care/advanced ADL retraining, Therapeutic Exercise, Wheelchair propulsion/positioning, UE/LE Strength taining/ROM, Skin care/wound managment, Pain management, DME/adaptive equipment instruction, Cognitive remediation/compensation, Community reintegration, Functional electrical stimulation, Patient/family education, Splinting/orthotics, UE/LE Coordination activities, Visual/perceptual remediation/compensation, Therapeutic Activities, Psychosocial support, Functional mobility training, Discharge planning  SLP Interventions Environmental controls, Internal/external aids, Speech/Language facilitation, Cueing hierarchy, Dysphagia/aspiration precaution training, Functional tasks, Patient/family education, Therapeutic Activities, Multimodal communication approach  TR Interventions    SW/CM Interventions Discharge Planning, Psychosocial Support, Patient/Family Education, Disease Management/Prevention   Barriers to Discharge MD  Medical stability  Nursing Lack of/limited family support, Home environment access/layout 1 level 2 ste right rail  solo, brother to assist prn  PT Inaccessible home environment, Decreased caregiver support, Home environment access/layout, Medication compliance, Insurance for SNF coverage    OT Decreased caregiver support, Inaccessible home environment, [place] for SNF coverage, Decreased caregiver support, Lack of/limited family support, Neurogenic Bowel & Bladder     Team Discharge Planning: Destination: PT-Home ,OT- Home , SLP-Home Projected Follow-up: PT-Home health PT, OT-  Home health OT, Outpatient  OT, 24 hour supervision/assistance, SLP-24 hour supervision/assistance, Home Health SLP Projected Equipment Needs: PT-To be determined, OT- To be determined, SLP-To be determined Equipment Details: PT- , OT-  Patient/family involved in discharge planning: PT- Patient,  OT-Patient unable/family or caregiver not available, SLP-Patient unable/family or caregive not available  MD ELOS: 7-10 days Medical Rehab Prognosis:  Excellent Assessment: Stanley Jimenez is a 66 year old man admitted to CIR with functional deficits secondary to multiple bilateral acute infarcts largest located left frontal and occipital lobe likely secondary to embolic disease versus hypercoagulability from esophageal cancer. Medications are being managed, and labs and vitals are being monitored regularly.     See Team Conference Notes for weekly updates to the plan of care

## 2021-06-27 NOTE — Progress Notes (Signed)
ANTICOAGULATION CONSULT NOTE- Follow-up  Pharmacy Consult for warfarin and heparin Indication: RIJ DVT, stroke   No Known Allergies  Patient Measurements:   Heparin Dosing Weight: 59 kg    Vital Signs: Temp: 98.1 F (36.7 C) (02/04 0402) Temp Source: Oral (02/03 2029) BP: 143/69 (02/04 0402) Pulse Rate: 88 (02/04 0402)  Labs: Recent Labs    06/25/21 0425 06/25/21 1323 06/26/21 0512 06/27/21 0555  HGB 9.1*  --  8.6* 9.0*  HCT 27.0*  --  26.5* 27.1*  PLT 150  --  195 244  LABPROT 15.5*  --  16.1* 17.2*  INR 1.2  --  1.3* 1.4*  HEPARINUNFRC 0.34 0.48 0.36 0.43  CREATININE 0.89  --   --   --      Estimated Creatinine Clearance: 67.9 mL/min (by C-G formula based on SCr of 0.89 mg/dL).     Assessment: Stanley Jimenez is a 66 y.o. 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 No anticoagulation prior to admission. INR on admission was 1.3. Pharmacy consulted for warfarin and heparin.  Anticoagulation was held 1/31 with new onset of hematuria. MD has reviewed and is ok to resume anticoagulation at this time.   H/H, plt stable. Note patient is also on aspirin 325 mg for stroke and PTA was on megestrol for cancer (currently held). AST has recently been slightly elevated, now eating up to 65%. No hematuria noted per RN.   Heparin level is 0.43 INR is subtherapeutic at 1.4  Goal of Therapy:  INR 2- 3 Heparin level 0.3-0.5 units/mL Monitor platelets by anticoagulation protocol: Yes   Plan:  Continue heparin at 1200 units/hr Warfarin 4 mg x1 today Monitor daily INR, heparin level, and CBC Monitor for signs/symptoms of bleeding   Thank you for including pharmacy in the care of this patient.  Zenaida Deed, PharmD PGY1 Acute Care Pharmacy Resident  Phone: [phone] 06/27/2021  7:58 AM  Please check [email or web address] for unit-specific pharmacy phone numbers.

## 2021-06-27 NOTE — Progress Notes (Signed)
Occupational Therapy Session Note  Patient Details  Name: Stanley Jimenez MRN: [id number] Date of Birth: 09/24/1955  Today's Date: 06/27/2021 OT Individual Time: 4098-1191 OT Individual Time Calculation (min): 68 min    Short Term Goals: Week 1:  OT Short Term Goal 1 (Week 1): STG = LTG 2/2 ELOS  Skilled Therapeutic Interventions/Progress Updates:    Pt received seated up in bed starting breakfast with NT present, appears agreeable to therapy. Session focus on self-care retraining, activity tolerance, transfer retraining, command follow, problem solving, visual perceptual skills in prep for improved ADL/IADL/func mobility performance + decreased caregiver burden. Pt able to gesture towards bag in cabinet when asked if he had clothing.   Self-fed 40% of breakfast with LUE, but unable to comprehend cues to take smaller bites/slow down per SLP swallowing strategy sheet. Pt with short episode of hiccups post breakfast and adamantly declining additional food. Completed ambulatory toilet transfer with CGA and no AD, assist to locate toilet paper. Continent void of bladder in stance, but initially urinating on floor with poor awareness. Pt able to select own clothing from bag with mod A to select out appropriate number of clothing. Donned T shirt with S, donned sweatshirt with min A to thread RUE. Donned underwear with CGA, donned pants with min A to thread RUE. Able to don B tennis shoes with min A to tie R shoe and for error correction, pt initially attempting to don tennis shoe over loafer already donned.  Completed oral care in stance with CGA, initially attempting to use toothpaste tube as toothbrush and required cues to spit out water.   Ambulated to and from gym with CGA and no AD.  Copied 2 peg designs from visual picture with overall mod A to complete with 100% accuracy. Pt able to identify colors appropriately ~85% of the time, but required increased time and hand over hand assist to  manipulate pegs into holes. Pt continues to become frustrated at times due to expressive language deficits.   Pt left supine in bed with bed alarm engaged, call bell in reach, and all immediate needs met.    Therapy Documentation Precautions:  Precautions Precautions: Fall Precaution Comments: expressive > receptive aphasia, R inattention Restrictions Weight Bearing Restrictions: No  Pain:  No s/sx throughout session ADL: See Care Tool for more details.  Therapy/Group: Individual Therapy  Volanda Napoleon MS, OTR/L  06/27/2021, 6:56 AM

## 2021-06-27 NOTE — Progress Notes (Signed)
Physical Therapy Session Note  Patient Details  Name: Stanley Jimenez MRN: [id number] Date of Birth: 12-29-1955  Today's Date: 06/27/2021 PT Individual Time: 1635-1730 PT Individual Time Calculation (min): 55 min   Short Term Goals: Week 1:  PT Short Term Goal 1 (Week 1): Pt will transfer to [facility] with CGA PT Short Term Goal 2 (Week 1): Pt will ambulate 140ft with CGA and LRAD PT Short Term Goal 3 (Week 1): Pt will attend to RUE >50% of time with min cues PT Short Term Goal 4 (Week 1): Pt will complete Berg balance Scale   Skilled Therapeutic Interventions/Progress Updates:   Pt received supine in bed and agreeable to PT. Supine>sit transfer with supervision assist and for safety with IV line management.   Pt performed clothing management sitting EOB to don shoes and pants sitting EOB. Gait training through hall x 546ft, 278ft, and 831ft cues for safety with IV line management, improved visual scanning to the R, and improved direction with visual instruction.   Pt performed BITS visual scanning user paced 2 x 2 min. Pt performed sequencing 1-5, 1-10, A-E, and A-j. Cues for visual scanning and identification and of number and letter. Standing on airex pad on first bout with supervision assist from PT for safety.   Nustep endurance and reciprocal movement training for BLE only x 3 min +2 min with therapeutic rest break between bouts due to fatigue.  Pt returned to room and performed sit>supine transfer to bed with supervision assist. Sit>supine completed without assist. and left supine in bed with call bell in reach and all needs met.        Therapy Documentation Precautions:  Precautions Precautions: Fall Precaution Comments: expressive > receptive aphasia, R inattention Restrictions Weight Bearing Restrictions: No  Pain: denies Therapy/Group: Individual Therapy  [name] 06/27/2021, 5:33 PM

## 2021-06-27 NOTE — Progress Notes (Signed)
PROGRESS NOTE   Subjective/Complaints: Expressive aphasia Tolerated therapy well today Denies pain Continuing on heparin due to INR 1.4   ROS- cannot obtain  Objective:   No results found. Recent Labs    06/26/21 0512 06/27/21 0555  WBC 17.7* 17.3*  HGB 8.6* 9.0*  HCT 26.5* 27.1*  PLT 195 244   Recent Labs    06/25/21 0425  NA 135  K 3.8  CL 104  CO2 21*  GLUCOSE 102*  BUN 12  CREATININE 0.89  CALCIUM 9.7    Intake/Output Summary (Last 24 hours) at 06/27/2021 1428 Last data filed at 06/27/2021 1300 Gross per 24 hour  Intake 240 ml  Output 150 ml  Net 90 ml         Physical Exam: Vital Signs Blood pressure (!) 117/93, pulse (!) 110, temperature 98.1 F (36.7 C), resp. rate 18, SpO2 100 %.  General: No acute distress Mood and affect are appropriate Heart: Tachycardic Lungs: Clear to auscultation, breathing unlabored, no rales or wheezes Abdomen: Positive bowel sounds, soft nontender to palpation, nondistended Extremities: No clubbing, cyanosis, or edema Skin: No evidence of breakdown, no evidence of rash Neurologic: Cranial nerves II through XII intact, motor strength is 5/5 in L and 4/5 deltoid, bicep, tricep, grip, hip flexor, knee extensors, ankle dorsiflexor and plantar flexor Sensory exam normal sensation to light touch and proprioception in bilateral upper and lower extremities Aphasia Exp> receptive, can follow simple commands point to white board when asked his name  Musculoskeletal: Full range of motion in all 4 extremities. No joint swelling    Assessment/Plan: 1. Functional deficits which require 3+ hours per day of interdisciplinary therapy in a comprehensive inpatient rehab setting. Physiatrist is providing close team supervision and 24 hour management of active medical problems listed below. Physiatrist and rehab team continue to assess barriers to discharge/monitor patient progress  toward functional and medical goals  Care Tool:  Bathing    Body parts bathed by patient: Buttocks   Body parts bathed by helper: Buttocks, Front perineal area Body parts n/a: Right arm, Left arm, Chest, Abdomen, Right upper leg, Left upper leg, Right lower leg, Left lower leg, Front perineal area, Buttocks   Bathing assist Assist Level: Maximal Assistance - Patient 24 - 49%     Upper Body Dressing/Undressing Upper body dressing   What is the patient wearing?: Pull over shirt    Upper body assist Assist Level: Minimal Assistance - Patient > 75%    Lower Body Dressing/Undressing Lower body dressing      What is the patient wearing?: Incontinence brief, Pants     Lower body assist Assist for lower body dressing: Minimal Assistance - Patient > 75%     Toileting Toileting    Toileting assist Assist for toileting: Contact Guard/Touching assist     Transfers Chair/bed transfer  Transfers assist     Chair/bed transfer assist level: Minimal Assistance - Patient > 75%     Locomotion Ambulation   Ambulation assist      Assist level: Minimal Assistance - Patient > 75% Assistive device: Walker-rolling Max distance: 160   Walk 10 feet activity   Assist  Assist level: Minimal Assistance - Patient > 75% Assistive device: Walker-rolling   Walk 50 feet activity   Assist      Assistive device: Walker-rolling    Walk 150 feet activity   Assist    Assist level: Minimal Assistance - Patient > 75% Assistive device: Walker-rolling    Walk 10 feet on uneven surface  activity   Assist     Assist level: Minimal Assistance - Patient > 75% Assistive device: Walker-rolling   Wheelchair     Assist Is the patient using a wheelchair?: No   Wheelchair activity did not occur: N/A         Wheelchair 50 feet with 2 turns activity    Assist    Wheelchair 50 feet with 2 turns activity did not occur: N/A       Wheelchair 150 feet  activity     Assist  Wheelchair 150 feet activity did not occur: N/A       Blood pressure (!) 117/93, pulse (!) 110, temperature 98.1 F (36.7 C), resp. rate 18, SpO2 100 %.  Medical Problem List and Plan: 1. Functional deficits secondary to multiple bilateral acute infarcts largest located left frontal and occipital lobe likely secondary to embolic disease versus hypercoagulability from esophageal cancer             -patient may shower             -ELOS/Goals: 5-7 days S             -Continue CIR Rib mets, left sternal met as well as left glenoid met no resistance training LUE  2.  Antithrombotics: -DVT/anticoagulation:  Pharmaceutical: Coumadin bridging with heparin until INR therapeutic             -antiplatelet therapy: Presently on low-dose aspirin will be able to discontinue 3. Pain Management: Tylenol as needed 4. Mood: Provide emotional support             -antipsychotic agents: N/A 5. Neuropsych: This patient is not capable of making decisions on his own behalf. 6. Skin/Wound Care: Routine skin checks 7. Hyponatremia: repeat Na tomorrow.  8.  Acute thrombus right IJ.  anticoagulation on IV heparin , until INR >2.0, 2/3 INR 1.3 9.  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  Not a candidate for intervention due to pre-existing metastatic cancer 10.  Thrombocytopenia/acute on chronic anemia/leukocytosis.  Follow-up CBC 11.  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  Follow-up chemotherapy as outpatient 12.  Prostate cancer with radioactive seed implant.  Follow-up outpatient.  Continue Flomax 0.4 mg daily 13.  Hypertension.  Well controlled. Monitor with increased mobility.  Home medication Norvasc 10 mg daily prior to admission.  Resume as needed.  Vitals:   06/27/21 0402 06/27/21 1253  BP: (!) 143/69 (!) 117/93  Pulse: 88 (!) 110  Resp: 16 18  Temp: 98.1 F (36.7 C) 98.1 F  (36.7 C)  SpO2: 100% 100%  Start Coreg 3.118m BID to help with tachycardia and hypertension.  14.  Paroxysmal SVT.  Tachycardic. Add Coreg 3.1276mBID.  15. Prediabetes: Hgb A1c 6.3 on 06/21/21- prior CBGs well controlled, not monitoring in rehab.     LOS: 3 days A FACE TO FACE EVALUATION WAS PERFORMED  KrClide Deutscheraulkar 06/27/2021, 2:28 PM

## 2021-06-28 LAB — CBC
HCT: 27.1 % — ABNORMAL LOW (ref 39.0–52.0)
Hemoglobin: 8.6 g/dL — ABNORMAL LOW (ref 13.0–17.0)
MCH: 26.7 pg (ref 26.0–34.0)
MCHC: 31.7 g/dL (ref 30.0–36.0)
MCV: 84.2 fL (ref 80.0–100.0)
Platelets: 290 10*3/uL (ref 150–400)
RBC: 3.22 MIL/uL — ABNORMAL LOW (ref 4.22–5.81)
RDW: 22.2 % — ABNORMAL HIGH (ref 11.5–15.5)
WBC: 16.7 10*3/uL — ABNORMAL HIGH (ref 4.0–10.5)
nRBC: 0 % (ref 0.0–0.2)

## 2021-06-28 LAB — HEPARIN LEVEL (UNFRACTIONATED): Heparin Unfractionated: 0.39 IU/mL (ref 0.30–0.70)

## 2021-06-28 LAB — PROTIME-INR
INR: 1.6 — ABNORMAL HIGH (ref 0.8–1.2)
Prothrombin Time: 19.1 seconds — ABNORMAL HIGH (ref 11.4–15.2)

## 2021-06-28 MED ORDER — WARFARIN SODIUM 4 MG PO TABS
4.0000 mg | ORAL_TABLET | Freq: Once | ORAL | Status: AC
  Administered 2021-06-28: 4 mg via ORAL
  Filled 2021-06-28: qty 1

## 2021-06-28 NOTE — Progress Notes (Signed)
Physical Therapy Session Note  Patient Details  Name: Stanley Jimenez MRN: [id number] Date of Birth: 11-02-1955  Today's Date: 06/28/2021 PT Individual Time: 1103-1594 PT Individual Time Calculation (min): 54 min   Short Term Goals: Week 1:  PT Short Term Goal 1 (Week 1): Pt will transfer to [facility] with CGA PT Short Term Goal 2 (Week 1): Pt will ambulate 122ft with CGA and LRAD PT Short Term Goal 3 (Week 1): Pt will attend to RUE >50% of time with min cues PT Short Term Goal 4 (Week 1): Pt will complete Berg balance Scale  Skilled Therapeutic Interventions/Progress Updates:      Chart reviewed and pt agreeable to therapy. Pt received semi-reclined in bed. Using communication board, pt reported no c/o pain. Session focused on activity tolerance and safe navigation with AD in setting of visual deficits to promote fall prevention and safe access of home and community environments. Pt initiated session with independent transfer to EOB. Pt then amb 212ft with RW + CGA x2. Of note, pt assist level primarily 2/2 impulsivity and R inattention. Pt required >50% VC to avoid R side objects during amb. Pt then attempted visual field scanning exercises of reaches around visual field. Pt displayed difficulty with VC but was able to successfully complete exercise with tactile cues and demonstration. Pt expressed frustration with communication and was given a short therapeutic break. After 3 mins, PT and pt continued with sit to stand practice, however pt again required strong tactile cues and demonstration to maintain exercise adherence. Pt then complete reaches outside of BOS in standing with CGA x4. Pt took short therapeutic break. After 3 mins, pt repeated exercises, but again required same strong tactile cues and demonstration, without display of carried-over comprehension between rounds. Pt and PT then utilized communication board for pt to express frustration with lack of verbalization. Pt willing to continue PT  and completed session with 2x10 high knee marching and heel raises. At end of session, pt was left semi-reclined in bed with alarm engaged, nurse call bell and all needs in reach.   Therapy Documentation Precautions:  Precautions Precautions: Fall Precaution Comments: expressive > receptive aphasia, R inattention Restrictions Weight Bearing Restrictions: No      Therapy/Group: Individual Therapy  Marquette Saa, PT, DPT 06/28/2021, 3:40 PM

## 2021-06-28 NOTE — Progress Notes (Signed)
ANTICOAGULATION CONSULT NOTE- Follow-up  Pharmacy Consult for warfarin and heparin Indication: RIJ DVT, stroke   No Known Allergies  Patient Measurements:   Heparin Dosing Weight: 59 kg    Vital Signs:    Labs: Recent Labs    06/26/21 0512 06/27/21 0555 06/28/21 0458  HGB 8.6* 9.0* 8.6*  HCT 26.5* 27.1* 27.1*  PLT 195 244 290  LABPROT 16.1* 17.2* 19.1*  INR 1.3* 1.4* 1.6*  HEPARINUNFRC 0.36 0.43 0.39     Estimated Creatinine Clearance: 67.9 mL/min (by C-G formula based on SCr of 0.89 mg/dL).     Assessment: Stanley Jimenez is a 66 y.o. 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 No anticoagulation prior to admission. INR on admission was 1.3. Pharmacy consulted for warfarin and heparin.  Anticoagulation was held 1/31 with new onset of hematuria. MD has reviewed and is ok to resume anticoagulation at this time.   Heparin level today is therapeutic at 0.39 and INR is subtherapeutic at 1.6. CBC is stable. No hematuria noted per chart review.   Goal of Therapy:  INR 2- 3 Heparin level 0.3-0.5 units/mL Monitor platelets by anticoagulation protocol: Yes   Plan:  Continue heparin at 1200 units/hr Warfarin 4 mg x1 today Monitor daily INR, heparin level, and CBC Monitor for signs/symptoms of bleeding   Thank you for including pharmacy in the care of this patient.  Zenaida Deed, PharmD PGY1 Acute Care Pharmacy Resident  Phone: [phone] 06/28/2021  8:22 AM  Please check [email or web address] for unit-specific pharmacy phone numbers.

## 2021-06-28 NOTE — Progress Notes (Signed)
PROGRESS NOTE   Subjective/Complaints: No new complaints expressed Expressive aphasia Appreciate pharmacy assistance with anticoagulation WBC trending downward   ROS-Cannot obtain  Objective:   No results found. Recent Labs    06/27/21 0555 06/28/21 0458  WBC 17.3* 16.7*  HGB 9.0* 8.6*  HCT 27.1* 27.1*  PLT 244 290   No results for input(s): NA, K, CL, CO2, GLUCOSE, BUN, CREATININE, CALCIUM in the last 72 hours.   Intake/Output Summary (Last 24 hours) at 06/28/2021 1120 Last data filed at 06/28/2021 0920 Gross per 24 hour  Intake 1251.32 ml  Output 325 ml  Net 926.32 ml         Physical Exam: Vital Signs Blood pressure 130/67, pulse 88, temperature 97.8 F (36.6 C), resp. rate 16, SpO2 100 %.  General: No acute distress Mood and affect are appropriate Heart: Tachycardic Lungs: Clear to auscultation, breathing unlabored, no rales or wheezes Abdomen: Positive bowel sounds, soft nontender to palpation, nondistended Extremities: No clubbing, cyanosis, or edema Skin: No evidence of breakdown, no evidence of rash Neurologic: Cranial nerves II through XII intact, motor strength is 5/5 in L and 4/5 deltoid, bicep, tricep, grip, hip flexor, knee extensors, ankle dorsiflexor and plantar flexor Sensory exam normal sensation to light touch and proprioception in bilateral upper and lower extremities Aphasia Exp> receptive, can follow simple commands point to white board when asked his name  Musculoskeletal: Full range of motion in all 4 extremities. No joint swelling Sit to supine with supervision    Assessment/Plan: 1. Functional deficits which require 3+ hours per day of interdisciplinary therapy in a comprehensive inpatient rehab setting. Physiatrist is providing close team supervision and 24 hour management of active medical problems listed below. Physiatrist and rehab team continue to assess barriers to  discharge/monitor patient progress toward functional and medical goals  Care Tool:  Bathing    Body parts bathed by patient: Buttocks   Body parts bathed by helper: Buttocks, Front perineal area Body parts n/a: Right arm, Left arm, Chest, Abdomen, Right upper leg, Left upper leg, Right lower leg, Left lower leg, Front perineal area, Buttocks   Bathing assist Assist Level: Maximal Assistance - Patient 24 - 49%     Upper Body Dressing/Undressing Upper body dressing   What is the patient wearing?: Pull over shirt    Upper body assist Assist Level: Minimal Assistance - Patient > 75%    Lower Body Dressing/Undressing Lower body dressing      What is the patient wearing?: Incontinence brief, Pants     Lower body assist Assist for lower body dressing: Minimal Assistance - Patient > 75%     Toileting Toileting    Toileting assist Assist for toileting: Contact Guard/Touching assist     Transfers Chair/bed transfer  Transfers assist     Chair/bed transfer assist level: Minimal Assistance - Patient > 75%     Locomotion Ambulation   Ambulation assist      Assist level: Minimal Assistance - Patient > 75% Assistive device: Walker-rolling Max distance: 160   Walk 10 feet activity   Assist     Assist level: Minimal Assistance - Patient > 75% Assistive device: Walker-rolling  Walk 50 feet activity   Assist      Assistive device: Walker-rolling    Walk 150 feet activity   Assist    Assist level: Minimal Assistance - Patient > 75% Assistive device: Walker-rolling    Walk 10 feet on uneven surface  activity   Assist     Assist level: Minimal Assistance - Patient > 75% Assistive device: Walker-rolling   Wheelchair     Assist Is the patient using a wheelchair?: No   Wheelchair activity did not occur: N/A         Wheelchair 50 feet with 2 turns activity    Assist    Wheelchair 50 feet with 2 turns activity did not occur:  N/A       Wheelchair 150 feet activity     Assist  Wheelchair 150 feet activity did not occur: N/A       Blood pressure 130/67, pulse 88, temperature 97.8 F (36.6 C), resp. rate 16, SpO2 100 %.  Medical Problem List and Plan: 1. Functional deficits secondary to multiple bilateral acute infarcts largest located left frontal and occipital lobe likely secondary to embolic disease versus hypercoagulability from esophageal cancer             -patient may shower             -ELOS/Goals: 5-7 days S             -Continue CIR Rib mets, left sternal met as well as left glenoid met no resistance training LUE  2.  Antithrombotics: -DVT/anticoagulation:  Pharmaceutical: Coumadin bridging with heparin until INR therapeutic             -antiplatelet therapy: Presently on low-dose aspirin will be able to discontinue 3. Pain Management: Tylenol as needed 4. Mood: Provide emotional support             -antipsychotic agents: N/A 5. Neuropsych: This patient is not capable of making decisions on his own behalf. 6. Skin/Wound Care: Routine skin checks 7. Hyponatremia: repeat Na tomorrow.  8.  Acute thrombus right IJ.  anticoagulation on IV heparin , until INR >2.0, 2/3 INR 1.3 9.  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  Not a candidate for intervention due to pre-existing metastatic cancer 10.  Thrombocytopenia/acute on chronic anemia/leukocytosis.  Follow-up CBC 11.  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  Follow-up chemotherapy as outpatient 12.  Prostate cancer with radioactive seed implant.  Follow-up outpatient.  Continue Flomax 0.4 mg daily 13.  Hypertension.  Well controlled. Monitor with increased mobility.  Home medication Norvasc 10 mg daily prior to admission.  Resume as needed.  Vitals:   06/27/21 2005 06/28/21 0917  BP: 135/70 130/67  Pulse: 90 88  Resp: 16 16  Temp: 97.8 F (36.6  C)   SpO2: 100%   Start Coreg 3.$RemoveBefor'125mg'[id number]$  BID to help with tachycardia and hypertension, better controlled, continue.  14.  Paroxysmal SVT.  Tachycardic. Continue Coreg 3.$RemoveBeforeDE'125mg'[id number]$  BID.  15. Prediabetes: Hgb A1c 6.3 on 06/21/21- prior CBGs well controlled, not monitoring in rehab.     LOS: 4 days A FACE TO FACE EVALUATION WAS PERFORMED  Rexann Lueras P Angelli Baruch 06/28/2021, 11:20 AM

## 2021-06-29 LAB — CBC
HCT: 27.7 % — ABNORMAL LOW (ref 39.0–52.0)
Hemoglobin: 9 g/dL — ABNORMAL LOW (ref 13.0–17.0)
MCH: 27.3 pg (ref 26.0–34.0)
MCHC: 32.5 g/dL (ref 30.0–36.0)
MCV: 83.9 fL (ref 80.0–100.0)
Platelets: 321 K/uL (ref 150–400)
RBC: 3.3 MIL/uL — ABNORMAL LOW (ref 4.22–5.81)
RDW: 22.4 % — ABNORMAL HIGH (ref 11.5–15.5)
WBC: 14.9 K/uL — ABNORMAL HIGH (ref 4.0–10.5)
nRBC: 0 % (ref 0.0–0.2)

## 2021-06-29 LAB — PROTIME-INR
INR: 1.9 — ABNORMAL HIGH (ref 0.8–1.2)
Prothrombin Time: 21.8 s — ABNORMAL HIGH (ref 11.4–15.2)

## 2021-06-29 LAB — HEPARIN LEVEL (UNFRACTIONATED): Heparin Unfractionated: 0.3 [IU]/mL (ref 0.30–0.70)

## 2021-06-29 MED ORDER — WARFARIN SODIUM 4 MG PO TABS
4.0000 mg | ORAL_TABLET | Freq: Once | ORAL | Status: AC
  Administered 2021-06-29: 4 mg via ORAL
  Filled 2021-06-29: qty 1

## 2021-06-29 NOTE — Progress Notes (Signed)
Physical Therapy Session Note  Patient Details  Name: Stanley Jimenez MRN: [id number] Date of Birth: 08-28-1955  Today's Date: 06/29/2021 PT Individual Time: 0805-0859 PT Individual Time Calculation (min): 54 min   Short Term Goals: Week 1:  PT Short Term Goal 1 (Week 1): Pt will transfer to [facility] with CGA PT Short Term Goal 2 (Week 1): Pt will ambulate 116ft with CGA and LRAD PT Short Term Goal 3 (Week 1): Pt will attend to RUE >50% of time with min cues PT Short Term Goal 4 (Week 1): Pt will complete Berg balance Scale  Skilled Therapeutic Interventions/Progress Updates:  Patient supine in room on entrance to room. Patient alert and agreeable to PT session. Unable to relate desires with words throughout session. Able to answer y/n questions with head nod/ shake.   Patient with no pain complaint throughout session.   Therapeutic Activity: Bed Mobility: Patient performed supine --> sit with supervision for impulsivity. VC/ tc required for waiting for therapist prior to standing from bed.  Transfers: Patient performed sit<>stand and stand pivot transfers throughout session with supervision. Pt able to control rise and descent with good, smooth movements to reach full stance with no AD for balance/ support. Provided verbal cues for upright posture and completing scap set for pulling shoulders back to improve posture.   Gait Training:  Patient ambulated >400 ft using IV pole for balance with light CGA to reach therapy gym from [facility] and back with supervision from therapy gym to elevator doors. Pt incorrectly pushes for elevator to go down on return trip to room. Demonstrated slow pace. Provided vc/ tc for intermittent scan to R side in order to improve field of vision and avoid obstacles. Gently runs into 2 obstacles with IV pole but is able to self correct with no LOB.  Neuromuscular Re-ed: NMR facilitated during session with focus on dynamic balance, problem solving, receptive and expressive  aphasia, apraxia. Pt guided in search for color discs in hallway. Pt instructed to find one color at a time. Is unsure of color initially requiring min cues to facilitate correct color, but then is able to complete search for discs with retrieval of wrong color 4/ 15 attempts. Two seated rest breaks required to complete.    Pt also guided in toe touched to cones placed to pt's R side. Pt with difficulty performing initially and at first attempts to pick up cones. Visually demonstrated task and pt able to follow. LOB to L side x2 and to R side x4 while attempting to balance to LLE in order to touch toe to cone. Performed out and back x2. Seated rest between with pt frustrated. Explanation provided that therapist knew this would be difficulty  but pt needs to be challenged if he is to improve. Pt demos understanding.  NMR performed for improvements in motor control and coordination, balance, sequencing, judgement, and self confidence/ efficacy in performing all aspects of mobility at highest level of independence.   Need to toilet once returned to room. CGA to toilet with pt able to manage clothing with supervision. NT notifed and provided supervision for remainder of toileting.   Patient seated on toilet at end of session with NT supervision.     Therapy Documentation Precautions:  Precautions Precautions: Fall Precaution Comments: expressive > receptive aphasia, R inattention Restrictions Weight Bearing Restrictions: No General:   Vital Signs: Therapy Vitals Pulse Rate: 93 BP: 127/69 Pain: Pain Assessment Pain Scale: 0-10 Pain Score: 0-No pain  Therapy/Group: Individual Therapy  Alger Simons PT, DPT 06/29/2021, 10:28 AM

## 2021-06-29 NOTE — Progress Notes (Signed)
Occupational Therapy Session Note  Patient Details  Name: Stanley Jimenez MRN: [id number] Date of Birth: 10-Apr-1956  Today's Date: 06/29/2021 OT Individual Time: 5621-3086 OT Individual Time Calculation (min): 69 min    Short Term Goals: Week 1:  OT Short Term Goal 1 (Week 1): STG = LTG 2/2 ELOS  Skilled Therapeutic Interventions/Progress Updates:    Session 1: (5784-6962)  Pt in bed to start with transfer to the EOB with supervision.  He donned his shoes with setup and when asked if he wanted to wash up at the sink, he nodded his head "yes".  He was able to complete functional mobility over to the sink with therapist cueing him demonstrationally to brush his teeth.  He was able to pick up the toothpaste and place it in his mouth and then brush.  He needed cueing to place the toothbrush under the faucet as he would turn it on, but demonstrated motor planning deficits bringing it close to, but not under it.  Once complete, therapist asked again if he wanted to wash up and he then shook his head "no".  He was able to ambulate down to the ortho gym with min guard and no device.  Had him complete use of the BITS in standing.  Mod demonstrational cueing for completion of Visual Scanning Program for two minute intervals.  He used the RUE for all 3 intervals with the first 2 being just locating and pressing the blue dots.  He completed 28% with an average of 2.48 seconds on the first and then completed 51% accuracy with an average of 3.13 seconds.  The final set was completing locating numbers 1-20 in standing with the RUE.  It took him 4:21 with only 33% accuracy and an average of 13 seconds between each number.  Mod instructional cueing for scanning to the right to locate numbers as without cueing he could not locate them in order.  Next, had him transition over to the therapy mat and work on foam peg board task.  He was able to replicate two peg board designs with mod instructional cueing for scanning right  and placing the pegs appropriately based on color and orientation.  Finished session with ambulation back to the room with min guard and mod instructional cueing for turning into the correct room.  He was able to transfer to supine with supervision where he was left with the call button and phone in reach.  Safety alarm in place.  Pt non-verbal throughout session and inconsistent with understanding multi step commands.   Session 2: ([id number])  Pt in bed to start with transfer to sitting at supervision level.  Asked pt if he needed to go to the bathroom and used communication board picture as well. He nodded his head "yes".  He donned his shoes and completed sit to stand with min guard assist and no assistive device.  He then ambulated toward the sink.  When asked if he needed to go to the bathroom again with therapist already having the door open and pointing to the toilet, he shook his head "No".  Instead he moved up to the sink and completed oral hygiene again with min assist.  Slight apraxia noted as he would hold the toothbrush close to the running water but not under it.  Therapist assisted with this.  After completion of oral hygiene, he ambulated to the [facility] gym with min guard and no device.  Had him sit on a therapy mat for work on  Nine Hole Peg test and beach ball volleyball using 1lb dowel rod for safety.  He was able to complete the 9 hole peg test in 1:03 with the left hand and then 1:55 with the right.  Mod demonstrational cueing was needed throughout in order to keep him from using both hands together when trying to isolate only one UE.  He was able to use the 1 lb dowel rod with BUEs and hit the ball back to the therapist with 75% accuracy.  He completed 1 set of 20 repetitions.  He was able to finish session with return to the room at min guard without an assistive device and transfer back to the bed with supervision.  Call button and phone in reach with safety alarm in place.    Therapy  Documentation Precautions:  Precautions Precautions: Fall Precaution Comments: expressive > receptive aphasia, R inattention Restrictions Weight Bearing Restrictions: No  Pain: Pain Assessment Pain Scale: Faces Pain Score: 0-No pain    Therapy/Group: Individual Therapy  Alta Shober OTR/L 06/29/2021, 12:46 PM

## 2021-06-29 NOTE — Progress Notes (Signed)
PROGRESS NOTE   Subjective/Complaints:  Remains aphasic, some apraxia noted by PT   ROS-Cannot obtain  Objective:   No results found. Recent Labs    06/28/21 0458 06/29/21 0512  WBC 16.7* 14.9*  HGB 8.6* 9.0*  HCT 27.1* 27.7*  PLT 290 321    No results for input(s): NA, K, CL, CO2, GLUCOSE, BUN, CREATININE, CALCIUM in the last 72 hours.   Intake/Output Summary (Last 24 hours) at 06/29/2021 0809 Last data filed at 06/29/2021 0430 Gross per 24 hour  Intake 237.2 ml  Output 151 ml  Net 86.2 ml          Physical Exam: Vital Signs Blood pressure 127/69, pulse 93, temperature 97.8 F (36.6 C), temperature source Oral, resp. rate 18, SpO2 100 %.  General: No acute distress Mood and affect are appropriate Heart: Mild tachy normal rythmn Lungs: Clear to auscultation, breathing unlabored, no rales or wheezes Abdomen: Positive bowel sounds, soft nontender to palpation, nondistended Extremities: No clubbing, cyanosis, or edema Skin: No evidence of breakdown, no evidence of rash Neurologic: Cranial nerves II through XII intact, motor strength is 5/5 in L and 4/5 Right deltoid, bicep, tricep, grip, hip flexor, knee extensors, ankle dorsiflexor and plantar flexor Sensory exam cannot assess due to aphasia  Aphasia Exp> receptive, can follow simple commands point to white board when asked his name  Musculoskeletal: Full range of motion in all 4 extremities. No joint swelling Sit to supine with supervision    Assessment/Plan: 1. Functional deficits which require 3+ hours per day of interdisciplinary therapy in a comprehensive inpatient rehab setting. Physiatrist is providing close team supervision and 24 hour management of active medical problems listed below. Physiatrist and rehab team continue to assess barriers to discharge/monitor patient progress toward functional and medical goals  Care Tool:  Bathing    Body  parts bathed by patient: Buttocks   Body parts bathed by helper: Buttocks, Front perineal area Body parts n/a: Right arm, Left arm, Chest, Abdomen, Right upper leg, Left upper leg, Right lower leg, Left lower leg, Front perineal area, Buttocks   Bathing assist Assist Level: Maximal Assistance - Patient 24 - 49%     Upper Body Dressing/Undressing Upper body dressing   What is the patient wearing?: Pull over shirt    Upper body assist Assist Level: Minimal Assistance - Patient > 75%    Lower Body Dressing/Undressing Lower body dressing      What is the patient wearing?: Incontinence brief, Pants     Lower body assist Assist for lower body dressing: Minimal Assistance - Patient > 75%     Toileting Toileting    Toileting assist Assist for toileting: Contact Guard/Touching assist     Transfers Chair/bed transfer  Transfers assist     Chair/bed transfer assist level: Minimal Assistance - Patient > 75%     Locomotion Ambulation   Ambulation assist      Assist level: Minimal Assistance - Patient > 75% Assistive device: Walker-rolling Max distance: 160   Walk 10 feet activity   Assist     Assist level: Minimal Assistance - Patient > 75% Assistive device: Walker-rolling   Walk 50 feet activity  Assist      Assistive device: Walker-rolling    Walk 150 feet activity   Assist    Assist level: Minimal Assistance - Patient > 75% Assistive device: Walker-rolling    Walk 10 feet on uneven surface  activity   Assist     Assist level: Minimal Assistance - Patient > 75% Assistive device: Walker-rolling   Wheelchair     Assist Is the patient using a wheelchair?: No   Wheelchair activity did not occur: N/A         Wheelchair 50 feet with 2 turns activity    Assist    Wheelchair 50 feet with 2 turns activity did not occur: N/A       Wheelchair 150 feet activity     Assist  Wheelchair 150 feet activity did not occur:  N/A       Blood pressure 127/69, pulse 93, temperature 97.8 F (36.6 C), temperature source Oral, resp. rate 18, SpO2 100 %.  Medical Problem List and Plan: 1. Functional deficits secondary to multiple bilateral acute infarcts largest located left frontal and occipital lobe likely secondary to embolic disease 0/09/38  versus hypercoagulability from esophageal cancer             -patient may shower             -ELOS/Goals: 5-7 days S             -Continue CIR Rib mets, left sternal met as well as left glenoid met no resistance training LUE - no pain with UE AROM  2.  Antithrombotics: -DVT/anticoagulation:  Pharmaceutical: Coumadin bridging with heparin until INR therapeutic             -antiplatelet therapy: Presently on low-dose aspirin will be able to discontinue 3. Pain Management: Tylenol as needed 4. Mood: Provide emotional support             -antipsychotic agents: N/A 5. Neuropsych: This patient is not capable of making decisions on his own behalf. 6. Skin/Wound Care: Routine skin checks 7. Hyponatremia: repeat Na tomorrow.  8.  Acute thrombus right IJ.  anticoagulation on IV heparin , until INR >2.0, 2/3 INR 1.9  9.  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  Not a candidate for intervention due to pre-existing metastatic cancer 10.  Thrombocytopenia/acute on chronic anemia/leukocytosis.  Follow-up CBC 11.  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  Follow-up chemotherapy as outpatient 12.  Prostate cancer with radioactive seed implant.  Follow-up outpatient.  Continue Flomax 0.4 mg daily 13.  Hypertension.  Well controlled. Monitor with increased mobility.  Home medication Norvasc 10 mg daily prior to admission.  Resume as needed.  Vitals:   06/29/21 0448 06/29/21 0731  BP: (!) 149/77 127/69  Pulse: 91 93  Resp: 18   Temp: 97.8 F (36.6 C)   SpO2: 100%   Start Coreg 3.$RemoveBefor'125mg'[id number]$  BID  to help with tachycardia and hypertension, BP controlled mild tachy 14.  Paroxysmal SVT.  Tachycardic. Continue Coreg 3.$RemoveBeforeDE'125mg'[id number]$  BID.  15. Prediabetes: Hgb A1c 6.3 on 06/21/21- prior CBGs well controlled, not monitoring in rehab.     LOS: 5 days A FACE TO FACE EVALUATION WAS PERFORMED  Charlett Blake 06/29/2021, 8:09 AM

## 2021-06-29 NOTE — Progress Notes (Signed)
Speech Language Pathology Daily Session Note  Patient Details  Name: Stanley Jimenez MRN: [id number] Date of Birth: 1955/11/03  Today's Date: 06/29/2021 SLP Individual Time: 5038-8828 SLP Individual Time Calculation (min): 45 min  Short Term Goals: Week 1: SLP Short Term Goal 1 (Week 1): Patient will express wants/needs via multimodal communication with mod-to-max A verbal/visual cues SLP Short Term Goal 2 (Week 1): Patient will use picture/word based communication board during 50% of opportunities with mod-to-max A verbal/visual cues SLP Short Term Goal 3 (Week 1): Patient will verbalize/approximate at the word level in 50% of opportunities with mod-to-max A multimodal cues SLP Short Term Goal 4 (Week 1): Patient will follow 1-2 step directions with 75% accuracy with min-to-mod A verbal/visual cues SLP Short Term Goal 5 (Week 1): Patient will respond to basic yes/no questions regarding wants/needs in 90% of occasions with mod A verbal cues SLP Short Term Goal 6 (Week 1): Patient will tolerate current diet with effective mastication, oral clearance, and minimal-to-no overt s/s of aspiration and min A cues before consideration of diet advancement.  Skilled Therapeutic Interventions: Skilled ST treatment focused on communication goals. Upon arrival pt expressed "hey" with intelligible speech. SLP facilitated session by providing Max A verbal/visual cues for CV production with bilabial consonants and the following vowels /o/, /ai/, /u/, and /ae/ to achieve 50-75% intelligibly following multiple attempts. Pt most successful with visual articulatory cues, but required Max A to look at therapists lips to produce. Pt known to keep head down toward lap for majority of session with limited eye contact. Body language appears discouraged and sad. Pt made frequent vocalizations however they were perseverative in nature and ~98% were unintelligible ("man, man, man...", "no, no, no") with the exception of "yeah",  "no", huh" and "hey." Yes/no responses were ~75% accurate with min-to-mod A cues with some inconsistencies noted (e.g., pt expressed "no" but nodded his head yes") with minimal awareness. SLP typically able to clarify with follow up questions. Patient was left in bed with alarm activated and immediate needs within reach at end of session. Continue per current plan of care.      Pain Pain Assessment Pain Scale: Faces Pain Score: 0-No pain Faces Pain Scale: No hurt  Therapy/Group: Individual Therapy  Patty Sermons 06/29/2021, 2:43 PM

## 2021-06-29 NOTE — Progress Notes (Signed)
ANTICOAGULATION CONSULT NOTE - Follow Up Consult  Pharmacy Consult for Heparin, Coumadin Indication:  CVA + DVT  No Known Allergies  Patient Measurements:   Heparin Dosing Weight:    Vital Signs: Temp: 97.8 F (36.6 C) (02/06 0448) Temp Source: Oral (02/06 0448) BP: 127/69 (02/06 0731) Pulse Rate: 93 (02/06 0731)  Labs: Recent Labs    06/27/21 0555 06/28/21 0458 06/29/21 0512  HGB 9.0* 8.6* 9.0*  HCT 27.1* 27.1* 27.7*  PLT 244 290 321  LABPROT 17.2* 19.1* 21.8*  INR 1.4* 1.6* 1.9*  HEPARINUNFRC 0.43 0.39 0.30    Estimated Creatinine Clearance: 67.9 mL/min (by C-G formula based on SCr of 0.89 mg/dL).  Assessment: Anticoag:  Hep gtt >> warfarin for stroke, RIJ DVT (cancer) Hgb 9, Plt 119>290>321 HL 0.3, INR up to 1.9  Goal of Therapy:  Heparin level 0.3-0.7 units/ml Monitor platelets by anticoagulation protocol: Yes   Plan:  Continue heparin at 1200 units/hr (goal 0.3-0.5) Warfarin 4 mg x1 tonight Daily heparin level, INR, and CBC   Tama Grosz S. Alford Highland, PharmD, BCPS Clinical Staff Pharmacist [email or web address]  Alford Highland, [place] 06/29/2021,7:34 AM

## 2021-06-30 ENCOUNTER — Ambulatory Visit (HOSPITAL_COMMUNITY): Payer: Medicaid Other

## 2021-06-30 ENCOUNTER — Ambulatory Visit (HOSPITAL_COMMUNITY): Payer: Medicaid Other | Admitting: Hematology

## 2021-06-30 ENCOUNTER — Other Ambulatory Visit (HOSPITAL_COMMUNITY): Payer: Medicaid Other

## 2021-06-30 ENCOUNTER — Inpatient Hospital Stay (HOSPITAL_COMMUNITY): Payer: Medicaid Other

## 2021-06-30 LAB — CBC
HCT: 26.5 % — ABNORMAL LOW (ref 39.0–52.0)
Hemoglobin: 8.4 g/dL — ABNORMAL LOW (ref 13.0–17.0)
MCH: 26.8 pg (ref 26.0–34.0)
MCHC: 31.7 g/dL (ref 30.0–36.0)
MCV: 84.4 fL (ref 80.0–100.0)
Platelets: 338 10*3/uL (ref 150–400)
RBC: 3.14 MIL/uL — ABNORMAL LOW (ref 4.22–5.81)
RDW: 21.9 % — ABNORMAL HIGH (ref 11.5–15.5)
WBC: 14.6 10*3/uL — ABNORMAL HIGH (ref 4.0–10.5)
nRBC: 0 % (ref 0.0–0.2)

## 2021-06-30 LAB — PROTIME-INR
INR: 2 — ABNORMAL HIGH (ref 0.8–1.2)
Prothrombin Time: 22.3 seconds — ABNORMAL HIGH (ref 11.4–15.2)

## 2021-06-30 LAB — HEPARIN LEVEL (UNFRACTIONATED): Heparin Unfractionated: 0.41 IU/mL (ref 0.30–0.70)

## 2021-06-30 MED ORDER — NYSTATIN 100000 UNIT/ML MT SUSP
5.0000 mL | Freq: Four times a day (QID) | OROMUCOSAL | Status: DC
Start: 2021-06-30 — End: 2021-07-07
  Administered 2021-06-30 – 2021-07-07 (×28): 500000 [IU] via ORAL
  Filled 2021-06-30 (×22): qty 5

## 2021-06-30 MED ORDER — SODIUM CHLORIDE 0.9% FLUSH
10.0000 mL | Freq: Two times a day (BID) | INTRAVENOUS | Status: DC
  Administered 2021-06-30 – 2021-07-07 (×11): 10 mL

## 2021-06-30 MED ORDER — WARFARIN SODIUM 4 MG PO TABS
4.0000 mg | ORAL_TABLET | Freq: Once | ORAL | Status: AC
  Administered 2021-06-30: 4 mg via ORAL
  Filled 2021-06-30: qty 1

## 2021-06-30 MED ORDER — SODIUM CHLORIDE 0.9% FLUSH: 10.0000 mL | INTRAVENOUS | Status: DC | PRN

## 2021-06-30 NOTE — Progress Notes (Signed)
Speech Language Pathology Daily Session Note  Patient Details  Name: Stanley Jimenez MRN: [id number] Date of Birth: 1955-06-07  Today's Date: 06/30/2021 SLP Individual Time: 0803-0900 SLP Individual Time Calculation (min): 57 min  Short Term Goals: Week 1: SLP Short Term Goal 1 (Week 1): Patient will express wants/needs via multimodal communication with mod-to-max A verbal/visual cues SLP Short Term Goal 2 (Week 1): Patient will use picture/word based communication board during 50% of opportunities with mod-to-max A verbal/visual cues SLP Short Term Goal 3 (Week 1): Patient will verbalize/approximate at the word level in 50% of opportunities with mod-to-max A multimodal cues SLP Short Term Goal 4 (Week 1): Patient will follow 1-2 step directions with 75% accuracy with min-to-mod A verbal/visual cues SLP Short Term Goal 5 (Week 1): Patient will respond to basic yes/no questions regarding wants/needs in 90% of occasions with mod A verbal cues SLP Short Term Goal 6 (Week 1): Patient will tolerate current diet with effective mastication, oral clearance, and minimal-to-no overt s/s of aspiration and min A cues before consideration of diet advancement.  Skilled Therapeutic Interventions: Skilled ST treatment focused on swallowing and communication goals. Patient was sitting in recliner chair and accompanied by NSG on arrival. Pt agreeable to breakfast however only took 2 bites despite verbal encouragement. Denied consideration of other food/drink options. Pt consumed 263mL of thin liquid without overt s/sx of aspiration. Pt exhibited mildly prolonged mastication with dys 2 textures, complete oral clearance, and without overt s/sx of aspiration. Pt consumed trial of dys 3 textures and exhibited significantly prolonged, inefficient/incomplete mastication, what appeared to be effortful AP transit, and mild oral residuals. Pt without overt s/sx of aspiration. Pt denied consideration of further PO trials by  shaking his head "no" and looking down at his lap with minimal engagement with SLP. Recommend continuation of dys 2 diet and thin liquids at this time due to inefficient oral phase and minimal intake/opportunity for additional trials. NT informed SLP that patient exhibited pain behaviors by grabbing his side and w/ distressed look on his face following several meals. This was also observed by SLP during initial evaluation. Informed nurse and MD of this continued behavior. Pt agreeable to oral care with oral sponge. Ivory coating visible on lingual surface. Notified nurse and MD. Pt with overall reduced engagement with SLP this date. He often looked down toward his lap making limited eye contact and often shook his head no. Pt denied pain via verbal questions and use of pain scale. Pt also exhibited decreased effectiveness of yes/no responses. During structured tasks, pt was 80% accurate with basic biographical and environmental yes/no questions. However, in natural contexts pt exhibited inconsistencies and ~50% accuracy. When SLP stepped out of room briefly to locate nurse pt had attempted to exit recliner chair and ambulate in room with decreased awareness of environmental obstacles (i.e., bedside table, IV tubing, IV pole). Pt followed 1-step commands to sit and wait for therapist to clear environment to execute safe transfer to bed with sup A cues. Patient was left in bed with alarm activated and immediate needs within reach at end of session. Continue per current plan of care.      Pain Pain Assessment Pain Scale: Faces Faces Pain Scale: No hurt PAINAD (Pain Assessment in Advanced Dementia) Breathing: normal Negative Vocalization: none Facial Expression: smiling or inexpressive Body Language: relaxed Consolability: no need to console PAINAD Score: 0 Therapy/Group: Individual Therapy  Patty Sermons 06/30/2021, 9:07 AM

## 2021-06-30 NOTE — Progress Notes (Addendum)
Physical Therapy Session Note  Patient Details  Name: Stanley Jimenez MRN: [id number] Date of Birth: 03-25-1956  Today's Date: 06/30/2021 PT Individual Time: 1504-1600 PT Individual Time Calculation (min): 56 min   Short Term Goals: Week 1:  PT Short Term Goal 1 (Week 1): Pt will transfer to [facility] with CGA PT Short Term Goal 2 (Week 1): Pt will ambulate 133ft with CGA and LRAD PT Short Term Goal 3 (Week 1): Pt will attend to RUE >50% of time with min cues PT Short Term Goal 4 (Week 1): Pt will complete Berg balance Scale   Skilled Therapeutic Interventions/Progress Updates:  Patient supine in bed on entrance to room. Patient alert and agreeable to PT session. IV bag is no longer hanging and IV line is gone from pt.   Patient with mild stomach pain complaint at start of session. Therapist shares that she understands that pt did not feel well earlier and was vomiting. Imaging of abdomen has just been taken prior to session.   Therapeutic Activity: Bed Mobility: Patient performed supine <> sit with supervision. Pt continues to be impulsive in moving to EOB and attempts with rail up. VC/ tc required for safety. Transfers: Patient performed sit<>stand and stand pivot transfers throughout session with supervision. Provided vc/tc for reaching back to seat prior to descent to sit for safe positioning.   Gait Training:  Patient ambulated >350 ft x1/ 145' x1 using no AD with CGA. Demonstrated inability to maintain straight path with mild deviations throughout. Pt follows directional cues correctly 25% of attempts. Provided vc/ tc for managing obstacles and people in hallway as well as to try to maintain straighter path on one side of hallway.   Neuromuscular Re-ed: NMR facilitated during session with focus on standing balance, cognition, recognition. Pt guided in use of BITS for rotary sequencing task while standing. First 2 bouts with #s 1-10 on rotating small circle. Pt requiring 80% cueing on first  attempt and 70% on second attempt. Improving in accuracy, total time and reaction time. For final bout, #s 1-15 on larger circle with pt requiring 80% cues, 38.46%, 82min 59 sec with 12 sec reaction time.   Pt guided in stepping through agility ladder one step in each square. Pt unable to follow instructions for first pass through. Is able to improve in following instructions for second pass through, however balance worsens and requires stepping strategy to correct self.   NMR performed for improvements in motor control and coordination, balance, sequencing, judgement, and self confidence/ efficacy in performing all aspects of mobility at highest level of independence.   Patient supine  in bed at end of session with brakes locked, bed alarm set, and all needs within reach.     Therapy Documentation Precautions:  Precautions Precautions: Fall Precaution Comments: expressive > receptive aphasia, R inattention Restrictions Weight Bearing Restrictions: No General:   Vital Signs: Therapy Vitals Temp: 98 F (36.7 C) Temp Source: Oral Pulse Rate: 92 Resp: 18 BP: (!) 114/56 Patient Position (if appropriate): Lying Oxygen Therapy SpO2: 100 % O2 Device: Room Air Pain: Pain Assessment Faces Pain Scale: No hurt  Therapy/Group: Individual Therapy  Alger Simons PT, DPT 06/30/2021, 5:40 PM

## 2021-06-30 NOTE — Progress Notes (Signed)
ANTICOAGULATION CONSULT NOTE - Follow Up Consult  Pharmacy Consult for Coumadin Indication:  CVA + DVT  No Known Allergies  Patient Measurements:   Heparin Dosing Weight:    Vital Signs: Temp: 98.6 F (37 C) (02/07 0558) BP: 111/66 (02/07 0758) Pulse Rate: 92 (02/07 0758)  Labs: Recent Labs    06/28/21 0458 06/29/21 0512 06/30/21 0517  HGB 8.6* 9.0* 8.4*  HCT 27.1* 27.7* 26.5*  PLT 290 321 338  LABPROT 19.1* 21.8* 22.3*  INR 1.6* 1.9* 2.0*  HEPARINUNFRC 0.39 0.30 0.41     Estimated Creatinine Clearance: 67.9 mL/min (by C-G formula based on SCr of 0.89 mg/dL).  Assessment:   Anticoag:  Hep gtt >> warfarin for stroke, RIJ DVT (cancer), warfarin started 1/30 Hgb 8.4, Plt 119>338 HL 0.41, INR up to 2    Goal of Therapy:  INR 2-3   Plan:  D/c IV heparin Warfarin 4 mg x1 again tonight Daily INR   Earlie Arciga S. Alford Highland, PharmD, BCPS Clinical Staff Pharmacist [email or web address]  Alford Highland, Canesha Tesfaye Stillinger 06/30/2021,8:22 AM

## 2021-06-30 NOTE — Progress Notes (Signed)
Occupational Therapy Session Note  Patient Details  Name: Stanley Jimenez MRN: [id number] Date of Birth: 08-23-1955  Today's Date: 06/30/2021 OT Individual Time: 1310-1345 OT Individual Time Calculation (min): 35 min  and Today's Date: 06/30/2021 OT Missed Time: 15 Minutes Missed Time Reason: Patient ill (comment);Pain   Short Term Goals: Week 1:  OT Short Term Goal 1 (Week 1): STG = LTG 2/2 ELOS Week 2:     Skilled Therapeutic Interventions/Progress Updates:    Pt called to the room a little earlier than session scheduled time due to patient throwing up. Family present in room when arrived. Pt had thrown up Ensure he just drank all over bed and floor.  Pt transferred off the left side of the bed with supervision and contact guard into wheelchair. Pt was able to don shoes with extra time. More difficulty with tying right shoe due to decr visual scanning to the right and decr coordination. Pt ambulated to the toilet with min A with cues for attention to the right and performed toileting with no void with contact guard. Pt ambulated out into the hall to go to therapy gym but stopped indicating he was in pain. Indicating a headache after returning to the room. Reported to RN. Pt continued to close his eyes seated in the w/c. Pt left resting in the recliner-   with family present.  Therapy Documentation Precautions:  Precautions Precautions: Fall Precaution Comments: expressive > receptive aphasia, R inattention Restrictions Weight Bearing Restrictions: No General: General OT Amount of Missed Time: 15 Minutes Vital Signs:   Pain: Pain Assessment Pain Scale: Faces Faces Pain Scale: Hurts even more   Therapy/Group: Individual Therapy  Willeen Cass [facility] 06/30/2021, 2:09 PM

## 2021-06-30 NOTE — Progress Notes (Signed)
IV consult placed to cap and flush patients PAC. Patient has been inpatient with PAC accessed however no IV team orders were placed for line care, labs or maintenance. RN spoke with primary RN who states that she hasn't seen anyone from team round; explained that we didn't know the line was here as there are no orders for [place] to round.   RN deaccessed PAC and attempted x2 to reaccess without success. Will ask for additional assessment and attempt from team. Gauze dressing currently in place as site was bleeding.   Berta Denson Lorita Officer, RN

## 2021-06-30 NOTE — Progress Notes (Signed)
Occupational Therapy Session Note  Patient Details  Name: Stanley Jimenez MRN: [id number] Date of Birth: 03/29/1956  Today's Date: 06/30/2021 OT Individual Time: 0701-0757 OT Individual Time Calculation (min): 56 min    Short Term Goals: Week 1:  OT Short Term Goal 1 (Week 1): STG = LTG 2/2 ELOS  Skilled Therapeutic Interventions/Progress Updates:    Pt received side-lying in bed, no s/sx pain throughout session and agreeable to therapy. Session focus on self-care retraining, activity tolerance, IADL retraining, transfer retraining, dynamic standing balance, functional cognition in prep for improved ADL/IADL/func mobility performance + decreased caregiver burden. Came to sitting EOB with independence. Doffed T shirt/sweatshirt with S, able to select new clean T shirt and sweatshirt with min A to locate T shirt after misplacing it. Donned new items with only min A to adjust shirt over R shoulder. Declined donning new pants, required assist throughout session to tie to keep from falling down. Donned and tied B shoes with S. Completed ambulatory toilet transfer with CGA for steadying assist and no AD. No void. Washed hands at sink with S. One instance of running into bed on his R, no LOB.  Ambulated to and from gym with CGA for occasional steadying assist. Pt able to locate towels/wash clothes scattered throughout dayroom to fold/place in laundry basket. Pt able to loosely fold , but noted mild apraxia with completing task. Carried laundry basket ~100 ft before required assist to finish carrying > apartment due to fatigue. Pt able to sort utensils with overall min A for orientation of utensils / and correct sorting of utensils.   Pt left seated in recliner  with chair pad alarm engaged, call bell in reach, and all immediate needs met.    Therapy Documentation Precautions:  Precautions Precautions: Fall Precaution Comments: expressive > receptive aphasia, R inattention Restrictions Weight  Bearing Restrictions: No  Pain: no s/sx throughout session   ADL: See Care Tool for more details.   Therapy/Group: Individual Therapy  Volanda Napoleon MS, OTR/L  06/30/2021, 6:57 AM

## 2021-06-30 NOTE — Progress Notes (Signed)
PROGRESS NOTE   Subjective/Complaints:  +thrush +decreased appetite Regurgitating food Just finished breakfast  ROS-+food regurgitation as per SLP  Objective:   No results found. Recent Labs    06/29/21 0512 06/30/21 0517  WBC 14.9* 14.6*  HGB 9.0* 8.4*  HCT 27.7* 26.5*  PLT 321 338   No results for input(s): NA, K, CL, CO2, GLUCOSE, BUN, CREATININE, CALCIUM in the last 72 hours.   Intake/Output Summary (Last 24 hours) at 06/30/2021 1400 Last data filed at 06/30/2021 1311 Gross per 24 hour  Intake 353.64 ml  Output 1 ml  Net 352.64 ml         Physical Exam: Vital Signs Blood pressure 111/66, pulse 92, temperature 98.6 F (37 C), resp. rate 18, SpO2 100 %.  General: No acute distress Mood and affect are appropriate Heart: Mild tachy normal rythmn Mouth: +thrush Lungs: Clear to auscultation, breathing unlabored, no rales or wheezes Abdomen: Positive bowel sounds, soft nontender to palpation, nondistended Extremities: No clubbing, cyanosis, or edema Skin: No evidence of breakdown, no evidence of rash Neurologic: Cranial nerves II through XII intact, motor strength is 5/5 in L and 4/5 Right deltoid, bicep, tricep, grip, hip flexor, knee extensors, ankle dorsiflexor and plantar flexor Sensory exam cannot assess due to aphasia  Aphasia Exp> receptive, can follow simple commands point to white board when asked his name  Musculoskeletal: Full range of motion in all 4 extremities. No joint swelling Sit to supine with supervision    Assessment/Plan: 1. Functional deficits which require 3+ hours per day of interdisciplinary therapy in a comprehensive inpatient rehab setting. Physiatrist is providing close team supervision and 24 hour management of active medical problems listed below. Physiatrist and rehab team continue to assess barriers to discharge/monitor patient progress toward functional and medical  goals  Care Tool:  Bathing    Body parts bathed by patient: Buttocks   Body parts bathed by helper: Buttocks, Front perineal area Body parts n/a: Right arm, Left arm, Chest, Abdomen, Right upper leg, Left upper leg, Right lower leg, Left lower leg, Front perineal area, Buttocks   Bathing assist Assist Level: Maximal Assistance - Patient 24 - 49%     Upper Body Dressing/Undressing Upper body dressing   What is the patient wearing?: Pull over shirt    Upper body assist Assist Level: Minimal Assistance - Patient > 75%    Lower Body Dressing/Undressing Lower body dressing      What is the patient wearing?: Incontinence brief, Pants     Lower body assist Assist for lower body dressing: Minimal Assistance - Patient > 75%     Toileting Toileting    Toileting assist Assist for toileting: Contact Guard/Touching assist     Transfers Chair/bed transfer  Transfers assist     Chair/bed transfer assist level: Contact Guard/Touching assist     Locomotion Ambulation   Ambulation assist      Assist level: Contact Guard/Touching assist Assistive device: No Device Max distance: 200'   Walk 10 feet activity   Assist     Assist level: Minimal Assistance - Patient > 75% Assistive device: Walker-rolling   Walk 50 feet activity   Assist  Assist level: Minimal Assistance - Patient > 75% Assistive device: Walker-rolling    Walk 150 feet activity   Assist    Assist level: Minimal Assistance - Patient > 75% Assistive device: Walker-rolling    Walk 10 feet on uneven surface  activity   Assist     Assist level: Minimal Assistance - Patient > 75% Assistive device: Walker-rolling   Wheelchair     Assist Is the patient using a wheelchair?: No             Wheelchair 50 feet with 2 turns activity    Assist            Wheelchair 150 feet activity     Assist          Blood pressure 111/66, pulse 92, temperature 98.6 F (37  C), resp. rate 18, SpO2 100 %.  Medical Problem List and Plan: 1. Functional deficits secondary to multiple bilateral acute infarcts largest located left frontal and occipital lobe likely secondary to embolic disease 2/80/03  versus hypercoagulability from esophageal cancer             -patient may shower             -ELOS/Goals: 5-7 days S             Continue CIR Rib mets, left sternal met as well as left glenoid met no resistance training LUE - no pain with UE AROM  2.  Antithrombotics: -DVT/anticoagulation:  Pharmaceutical: Coumadin bridging with heparin until INR therapeutic             -antiplatelet therapy: Presently on low-dose aspirin will be able to discontinue 3. Pain Management: Tylenol as needed 4. Mood: Provide emotional support             -antipsychotic agents: N/A 5. Neuropsych: This patient is not capable of making decisions on his own behalf. 6. Skin/Wound Care: Routine skin checks 7. Hyponatremia: repeat Na tomorrow.  8.  Acute thrombus right IJ.  anticoagulation on IV heparin , until INR >2.0, 2/3 INR 1.9  9.  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  Not a candidate for intervention due to pre-existing metastatic cancer 10.  Thrombocytopenia/acute on chronic anemia/leukocytosis.  Follow-up CBC 11.  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  Follow-up chemotherapy as outpatient 12.  Prostate cancer with radioactive seed implant.  Follow-up outpatient.  Continue Flomax 0.4 mg daily 13.  Hypertension.  Well controlled. Monitor with increased mobility.  Home medication Norvasc 10 mg daily prior to admission.  Resume as needed.  Vitals:   06/30/21 0558 06/30/21 0758  BP: 115/70 111/66  Pulse: 88 92  Resp: 18   Temp: 98.6 F (37 C)   SpO2: 100%   Start Coreg 3.179m BID to help with tachycardia and hypertension, BP controlled mild tachy 14.  Paroxysmal SVT.  Tachycardic.  Continue Coreg 3.1259mBID.  15. Prediabetes: Hgb A1c 6.3 on 06/21/21- prior CBGs well controlled, not monitoring in rehab.  16. Thrush: start nystatin 17. Reflux of food: check KUB 18. Decreased appetite: if KUB normal, will prescribe appetite stimulant    LOS: 6 days A FACE TO FACE EVALUATION WAS PERFORMED  Stanley Jimenez Stanley Jimenez 06/30/2021, 2:00 PM

## 2021-07-01 ENCOUNTER — Telehealth: Payer: Self-pay

## 2021-07-01 LAB — PROTIME-INR
INR: 2.5 — ABNORMAL HIGH (ref 0.8–1.2)
Prothrombin Time: 27.2 seconds — ABNORMAL HIGH (ref 11.4–15.2)

## 2021-07-01 MED ORDER — HEPARIN SOD (PORK) LOCK FLUSH 100 UNIT/ML IV SOLN
500.0000 [IU] | INTRAVENOUS | Status: DC
  Administered 2021-07-01: 500 [IU]
  Filled 2021-07-01: qty 5

## 2021-07-01 MED ORDER — LIDOCAINE 5 % EX PTCH
1.0000 | MEDICATED_PATCH | CUTANEOUS | Status: DC
  Administered 2021-07-01 – 2021-07-06 (×6): 1 via TRANSDERMAL
  Filled 2021-07-01 (×3): qty 1

## 2021-07-01 MED ORDER — HEPARIN SOD (PORK) LOCK FLUSH 100 UNIT/ML IV SOLN
500.0000 [IU] | INTRAVENOUS | Status: DC | PRN
  Filled 2021-07-01: qty 5

## 2021-07-01 MED ORDER — MEGESTROL ACETATE 400 MG/10ML PO SUSP
400.0000 mg | Freq: Every day | ORAL | Status: DC
  Administered 2021-07-01 – 2021-07-07 (×7): 400 mg via ORAL
  Filled 2021-07-01 (×5): qty 10

## 2021-07-01 MED ORDER — WARFARIN SODIUM 2.5 MG PO TABS
2.5000 mg | ORAL_TABLET | Freq: Once | ORAL | Status: AC
  Administered 2021-07-01: 2.5 mg via ORAL
  Filled 2021-07-01: qty 1

## 2021-07-01 MED ORDER — SODIUM CHLORIDE 0.9 % IV SOLN: INTRAVENOUS | Status: AC

## 2021-07-01 NOTE — Progress Notes (Signed)
ANTICOAGULATION CONSULT NOTE - Follow Up Consult  Pharmacy Consult for Coumadin Indication:  CVA + DVT  No Known Allergies  Patient Measurements:   Heparin Dosing Weight:    Vital Signs: Temp: 98.1 F (36.7 C) (02/08 0502) Temp Source: Oral (02/08 0502) BP: 120/63 (02/08 0502) Pulse Rate: 83 (02/08 0502)  Labs: Recent Labs    06/29/21 0512 06/30/21 0517 07/01/21 0406  HGB 9.0* 8.4*  --   HCT 27.7* 26.5*  --   PLT 321 338  --   LABPROT 21.8* 22.3* 27.2*  INR 1.9* 2.0* 2.5*  HEPARINUNFRC 0.30 0.41  --      Estimated Creatinine Clearance: 67.9 mL/min (by C-G formula based on SCr of 0.89 mg/dL).  Assessment:   Anticoag:  Hep gtt >> warfarin for stroke, RIJ DVT (cancer), warfarin started 1/30 Hgb 8.4, Plt 119>338 INR up to 2>2.5    Goal of Therapy:  INR 2-3   Plan:  Warfarin 2.5mg  po x 1 tonight Daily INR   Yee Gangi S. Alford Highland, PharmD, BCPS Clinical Staff Pharmacist [email or web address]  Alford Highland, Kayson Tasker Stillinger 07/01/2021,10:13 AM

## 2021-07-01 NOTE — Patient Care Conference (Signed)
Inpatient RehabilitationTeam Conference and Plan of Care Update Date: 07/01/2021   Time: 10:08 AM     Patient Name: Stanley Jimenez      Medical Record Number: [id number]  Date of Birth: October 24, 1955 Sex: Male         Room/Bed: 5C10C/5C10C-01 Payor Info: Payor: [place] / Plan: [place] MEDICAID [place] / Product Type: *No Product type* /    Admit Date/Time:  06/24/2021  4:36 PM  Primary Diagnosis:  Embolic cerebral infarction [facility])  Hospital Problems: Principal Problem:   Embolic cerebral infarction [facility])    Expected Discharge Date: Expected Discharge Date: 07/07/21  Team Members Present: Physician leading conference: Dr. Leeroy Cha Social Worker Present: Erlene Quan, BSW Nurse Present: Dorien Chihuahua, RN PT Present: Barrie Folk, PT OT Present: Providence Lanius, OT SLP Present: Sherren Kerns, SLP PPS Coordinator present : Gunnar Fusi, SLP     Current Status/Progress Goal Weekly Team Focus  Bowel/Bladder   continent bowel and bladder  toilet every 2 hours  remain continent of bowel and bladder   Swallow/Nutrition/ Hydration   dys 2 diet, thin liquids - supervision level. Poor intake. Concern for abdominal discomfort with intake. Notified Dr. Ranell Patrick. Plan for xray.  sup A  increase intake, dys 3 trials   ADL's   min A LB dressing, S UB dressing, standing oral care, CGA toileting/toilet transfers; mild apraxia and R inattention  S overall  self-care/balance/transfer retraining, family/pt education, activity tolerance   Mobility   expressive aphasia; Bed mobility = SUP; Transfers = SUP; Gait = CGA/ MinA for balance and visual deficits  Bed mobility wih IND; Transfers with SUP; long distance gait and stairs with SUP  standing balance, problem solving; cognition; ambulation; family education   Communication   min A comp, max A expression  sup-to-min A comp, mod A exp (may downgrade)  functional communciation using multimodal  commnuication, communication board, consistency of yes/no responses.   Safety/Cognition/ Behavioral Observations            Pain   Denies any pain  pain is managed and controlled  pain is assessed, addressed, and managed   Skin   scattered dry, scaly patches  keep skin clean, dry moistured and intact.  Change any dressings as needed or scheduled     Discharge Planning:  Discharging home with assistance from brother [name]). Able to provide Min A. Other sibilings able to provide 24/7 supervision. insurance barrier for SNF and [facility]   Team Discussion: Patient with abd pain, nausea; KUB noted kidney stones. MD started megace. Patient with posterior neck pain; added K-pad for comfort. Progress also limited by expressive aphasia, apraxia, and right inattention.  Patient on target to meet rehab goals: yes, currently needs CGA assist overall. Goals set for supervision. On a D2 thin diet. Needs max assist for communication and multimodal cues. Able to ambulate and transfer without an assistive device and supervision - CGA.   *See Care Plan and progress notes for long and short-term goals.   Revisions to Treatment Plan:  D3 trials   Teaching Needs: Safety, transfers, toileting, medications, nutrition, etc  Current Barriers to Discharge: Decreased caregiver support  Possible Resolutions to Barriers: Family education with brother     Medical Summary Current Status: thrush, regurgitation of food, kidney stones, decreased appetite, tachycardia, hypotension, metastatic cancer, mild apraxia, severe aphasia  Barriers to Discharge: Medical stability  Barriers to Discharge Comments: thrush, regurgitation of food, kidney stones, decreased appetite, tachycardia, hypotension, metastatic cancer,  mild apraxia, severe aphasia, acute thrombus, HTN Possible Resolutions to [employer]: start nystatin, start IV fluids, start megace, decrease coreg, continue heparin bridge to warfarin with daily INR  checks, contiune norvasc   Continued Need for Acute Rehabilitation Level of Care: The patient requires daily medical management by a physician with specialized training in physical medicine and rehabilitation for the following reasons: Direction of a multidisciplinary physical rehabilitation program to maximize functional independence : Yes Medical management of patient stability for increased activity during participation in an intensive rehabilitation regime.: Yes Analysis of laboratory values and/or radiology reports with any subsequent need for medication adjustment and/or medical intervention. : Yes   I attest that I was present, lead the team conference, and concur with the assessment and plan of the team.   Dorien Chihuahua B 07/01/2021, 2:34 PM

## 2021-07-01 NOTE — Telephone Encounter (Signed)
Dan from the Rehab unit at the hospital said Stanley Jimenez is being released Tuesday and is on coumadin since having stroke. He wants to know if you will follow his coumadin if he calls over his results and dosage when he is released. Please advise  DAN  from Rehab [phone]

## 2021-07-01 NOTE — Plan of Care (Deleted)
°  Problem: RH Comprehension Communication Goal: LTG Patient will comprehend basic/complex auditory (SLP) Description: LTG: Patient will comprehend basic/complex auditory information with cues (SLP). Flowsheets (Taken 07/01/2021 1044) LTG: Patient will comprehend auditory information with cueing (SLP): Minimal Assistance - Patient > 75% Note: Goal downgraded due to slower than anticipated progress   Problem: RH Expression Communication Goal: LTG Patient will express needs/wants via multi-modal(SLP) Description: LTG:  Patient will express needs/wants via multi-modal communication (gestures/written, etc) with cues (SLP) Flowsheets (Taken 07/01/2021 1044) LTG: Patient will express needs/wants via multimodal communication (gestures/written, etc) with cueing (SLP):  Moderate Assistance - Patient 50 - 74%  Maximal Assistance - Patient 25 - 49% Note: Goal downgraded due to slower than anticipated progress

## 2021-07-01 NOTE — Plan of Care (Signed)
°  Problem: RH Comprehension Communication Goal: LTG Patient will comprehend basic/complex auditory (SLP) Description: LTG: Patient will comprehend basic/complex auditory information with cues (SLP). 07/01/2021 1350 by Charna Elizabeth T, CCC-SLP Flowsheets (Taken 07/01/2021 1350) LTG: Patient will comprehend auditory information with cueing (SLP):  Minimal Assistance - Patient > 75%  Moderate Assistance - Patient 50 - 74% Note: Goal downgraded due to slower than anticipated progress 07/01/2021 1044 by Charna Elizabeth T, CCC-SLP Flowsheets (Taken 07/01/2021 1044) LTG: Patient will comprehend auditory information with cueing (SLP): Minimal Assistance - Patient > 75% Note: Goal downgraded due to slower than anticipated progress   Problem: RH Expression Communication Goal: LTG Patient will express needs/wants via multi-modal(SLP) Description: LTG:  Patient will express needs/wants via multi-modal communication (gestures/written, etc) with cues (SLP) Flowsheets (Taken 07/01/2021 1044) LTG: Patient will express needs/wants via multimodal communication (gestures/written, etc) with cueing (SLP):  Moderate Assistance - Patient 50 - 74%  Maximal Assistance - Patient 25 - 49% Note: Goal downgraded due to slower than anticipated progress

## 2021-07-01 NOTE — Progress Notes (Signed)
Occupational Therapy Session Note  Patient Details  Name: Stanley Jimenez MRN: [id number] Date of Birth: December 01, 1955  Today's Date: 07/01/2021 OT Individual Time: 0702-0733 OT Individual Time Calculation (min): 31 min  and Today's Date: 07/01/2021 OT Missed Time: 30 Minutes + 30 min Missed Time Reason: Pain   Short Term Goals: Week 1:  OT Short Term Goal 1 (Week 1): STG = LTG 2/2 ELOS  Skilled Therapeutic Interventions/Progress Updates:    Session 1 ([id number]): Pt received awake in bed, appears agreeable to therapy. Session focus on self-care retraining, activity tolerance, R attention, functional cognition in prep for improved ADL/IADL/func mobility performance + decreased caregiver burden. Came to sitting EOB mod I (increased time). Required total A overall to select appropriate outfit from suitcase when presented, pt with tendency to pull out all clothing items at once. Initially attempting to don new T shirt over 2 existing shirts, requiring tactile cues for error awareness. Min A overall to don new shirt with to thread RUE due to inattention. Applied deodorant with S when presented with item, but additionally attempted to apply lotion as deodorant, as well due to apraxia.  Pt independently reaching out to pain scale placed on his table, indicated 8/10 pain in the back of his neck. RN then present to administer pain rx. Pt presented with heat pack to assist with pain management but indicates it is not helpful. Pt further declining ADL activity when presented clothing, breakfast, shaking head and returning to side-lying. Repositioned in bed to assist with pain relief. Pt missed 30 min of OT due to pain/inability to meaningfully participate in therapy.  Pt left side-lying in bed with bed alarm engaged, call bell in reach, and all immediate needs met.    Session 2 Attempt: Pt received in bed. Offered room level ADL, pt initially sitting EOB with S, but then rubbing back of neck and grimacing.  Able to use pain chart to identify 8/10 pain. Adamantly shaking head and returning to supine when offered OOB activity or pain management options. Pt missed 30 min of OT due to pain Pt left supine in bed with bed alarm engaged, call bell in reach, and all immediate needs met.    Therapy Documentation Precautions:  Precautions Precautions: Fall Precaution Comments: expressive > receptive aphasia, R inattention Restrictions Weight Bearing Restrictions: No  Pain: see session notes ADL: See Care Tool for more details.   Therapy/Group: Individual Therapy  Volanda Napoleon MS, OTR/L  07/01/2021, 6:55 AM

## 2021-07-01 NOTE — Progress Notes (Signed)
Physical Therapy Session Note  Patient Details  Name: Stanley Jimenez MRN: [id number] Date of Birth: 09/05/1955  Today's Date: 07/01/2021 PT Individual Time: 0905-0915 PT Individual Time Calculation (min): 10 min   Short Term Goals: Week 1:  PT Short Term Goal 1 (Week 1): Pt will transfer to [facility] with CGA PT Short Term Goal 2 (Week 1): Pt will ambulate 174ft with CGA and LRAD PT Short Term Goal 3 (Week 1): Pt will attend to RUE >50% of time with min cues PT Short Term Goal 4 (Week 1): Pt will complete Berg balance Scale  Skilled Therapeutic Interventions/Progress Updates:   Pt received supine in bed pt attempted to sit up EOB, but holding head and posterior neck, shaking head no when asked if he is alright. Using communication board, pt rates neck pain 9/10. RN reports pain meds given at 800 this AM. Pt returned to supine and repeatedly pulling bed rails back up when therapist set up bed for transfer to recliner. Continued to decline any physical therapy when head shake. Left supine in bed with call bell in reach and all needs met     Therapy Documentation Precautions:  Precautions Precautions: Fall Precaution Comments: expressive > receptive aphasia, R inattention Restrictions Weight Bearing Restrictions: No General: PT Amount of Missed Time (min): 50 Minutes PT Missed Treatment Reason: Pain Pain: Pain Assessment Pain Scale: 0-10 Pain Score: 0-No pain    Therapy/Group: Individual Therapy  [name] 07/01/2021, 10:35 AM

## 2021-07-01 NOTE — Progress Notes (Addendum)
PROGRESS NOTE   Subjective/Complaints: Complaining of abdominal pain. Discussed that abdominal XR shows lower rib fractures and diffuse mets- no bowel obstruction. Does have kidney stones. Will order IVF for tonight. May have to consult urology if stones persist. Will try lidocaine patch to help with rib fractures.   ROS-+food regurgitation as per SLP, +decreased appetite.  Objective:   DG Abd Portable 1V  Result Date: 06/30/2021 CLINICAL DATA:  Abdominal pain EXAM: PORTABLE ABDOMEN - 1 VIEW COMPARISON:  None. FINDINGS: Nonobstructive bowel gas pattern. There is a small calcification measuring 4 mm overlying the left renal shadow and tiny calcifications overlying the right renal shadow. Vascular calcifications. Prostate brachytherapy seeds. There are multiple lytic lesions seen within the spine, ribs, pelvis, and proximal femurs consistent with known diffuse lytic osseous metastatic disease. Subacute pathologic fractures of multiple lower ribs. IMPRESSION: No evidence of bowel obstruction. Diffuse lytic osseous metastatic disease with multiple subacute pathologic lower rib fractures bilaterally. Small calcifications overlying the kidneys bilaterally, consistent with small renal stones. Electronically Signed   By: Maurine Simmering M.D.   On: 06/30/2021 15:21   Recent Labs    06/29/21 0512 06/30/21 0517  WBC 14.9* 14.6*  HGB 9.0* 8.4*  HCT 27.7* 26.5*  PLT 321 338   No results for input(s): NA, K, CL, CO2, GLUCOSE, BUN, CREATININE, CALCIUM in the last 72 hours.   Intake/Output Summary (Last 24 hours) at 07/01/2021 1010 Last data filed at 06/30/2021 2200 Gross per 24 hour  Intake 110 ml  Output --  Net 110 ml         Physical Exam: Vital Signs Blood pressure 120/63, pulse 83, temperature 98.1 F (36.7 C), temperature source Oral, resp. rate 16, SpO2 100 %.  General: No acute distress Mood and affect are appropriate Heart: Mild  tachy normal rythmn Mouth: +thrush Lungs: Clear to auscultation, breathing unlabored, no rales or wheezes Abdomen: Positive bowel sounds, soft nontender to palpation, nondistended Extremities: No clubbing, cyanosis, or edema Skin: No evidence of breakdown, no evidence of rash Neurologic: Cranial nerves II through XII intact, motor strength is 5/5 in L and 4/5 Right deltoid, bicep, tricep, grip, hip flexor, knee extensors, ankle dorsiflexor and plantar flexor Sensory exam cannot assess due to aphasia  Aphasia Exp> receptive, can follow simple commands point to white board when asked his name  Musculoskeletal: Full range of motion in all 4 extremities. No joint swelling Sit to supine with supervision    Assessment/Plan: 1. Functional deficits which require 3+ hours per day of interdisciplinary therapy in a comprehensive inpatient rehab setting. Physiatrist is providing close team supervision and 24 hour management of active medical problems listed below. Physiatrist and rehab team continue to assess barriers to discharge/monitor patient progress toward functional and medical goals  Care Tool:  Bathing    Body parts bathed by patient: Buttocks   Body parts bathed by helper: Buttocks, Front perineal area Body parts n/a: Right arm, Left arm, Chest, Abdomen, Right upper leg, Left upper leg, Right lower leg, Left lower leg, Front perineal area, Buttocks   Bathing assist Assist Level: Maximal Assistance - Patient 24 - 49%     Upper Body Dressing/Undressing Upper body  dressing   What is the patient wearing?: Pull over shirt    Upper body assist Assist Level: Minimal Assistance - Patient > 75%    Lower Body Dressing/Undressing Lower body dressing      What is the patient wearing?: Incontinence brief, Pants     Lower body assist Assist for lower body dressing: Minimal Assistance - Patient > 75%     Toileting Toileting    Toileting assist Assist for toileting: Contact  Guard/Touching assist     Transfers Chair/bed transfer  Transfers assist     Chair/bed transfer assist level: Contact Guard/Touching assist     Locomotion Ambulation   Ambulation assist      Assist level: Contact Guard/Touching assist Assistive device: No Device Max distance: 200'   Walk 10 feet activity   Assist     Assist level: Minimal Assistance - Patient > 75% Assistive device: Walker-rolling   Walk 50 feet activity   Assist    Assist level: Minimal Assistance - Patient > 75% Assistive device: Walker-rolling    Walk 150 feet activity   Assist    Assist level: Minimal Assistance - Patient > 75% Assistive device: Walker-rolling    Walk 10 feet on uneven surface  activity   Assist     Assist level: Minimal Assistance - Patient > 75% Assistive device: Walker-rolling   Wheelchair     Assist Is the patient using a wheelchair?: No             Wheelchair 50 feet with 2 turns activity    Assist            Wheelchair 150 feet activity     Assist          Blood pressure 120/63, pulse 83, temperature 98.1 F (36.7 C), temperature source Oral, resp. rate 16, SpO2 100 %.  Medical Problem List and Plan: 1. Functional deficits secondary to multiple bilateral acute infarcts largest located left frontal and occipital lobe likely secondary to embolic disease 5/53/74  versus hypercoagulability from esophageal cancer             -patient may shower             -ELOS/Goals: 5-7 days S             Continue CIR Rib mets, left sternal met as well as left glenoid met no resistance training LUE - no pain with UE AROM   -Interdisciplinary Team Conference today   2.  Antithrombotics: -DVT/anticoagulation:  Pharmaceutical: Coumadin bridging with heparin until INR therapeutic             -antiplatelet therapy: Presently on low-dose aspirin will be able to discontinue 3. Pain Management: Tylenol as needed. Lidocaine patch added 4.  Mood: Provide emotional support             -antipsychotic agents: N/A 5. Neuropsych: This patient is not capable of making decisions on his own behalf. 6. Skin/Wound Care: Routine skin checks 7. Hyponatremia: repeat Na tomorrow.  8.  Acute thrombus right IJ.  anticoagulation on IV heparin , until INR >2.0, 2/3 INR 1.9  9.  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  Not a candidate for intervention due to pre-existing metastatic cancer 10.  Thrombocytopenia/acute on chronic anemia/leukocytosis.  Follow-up CBC 11.  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  Follow-up chemotherapy as outpatient 12.  Prostate cancer with radioactive seed implant.  Follow-up outpatient.  Continue Flomax 0.4 mg daily 13.  Hypertension.  Well controlled. Monitor with increased mobility.  Home medication Norvasc 10 mg daily prior to admission.  Resume as needed.  Vitals:   06/30/21 1953 07/01/21 0502  BP: 123/60 120/63  Pulse: 91 83  Resp: 16 16  Temp: 97.7 F (36.5 C) 98.1 F (36.7 C)  SpO2: 100% 100%  D/c coreg given hypotension 14.  Paroxysmal SVT.  Tachycardic. Continue Coreg 3.172m BID.  15. Prediabetes: Hgb A1c 6.3 on 06/21/21- prior CBGs well controlled, not monitoring in rehab.  16. Thrush: start nystatin 17. Reflux of food:  KUB shows no obstruction, discussed with patient 18. Decreased appetite: start megace 19. Kidney stones: IVF overnight    LOS: 7 days A FACE TO FACE EVALUATION WAS PERFORMED  Tykerria Mccubbins P Macklin Jacquin 07/01/2021, 10:10 AM

## 2021-07-01 NOTE — Progress Notes (Signed)
Patient ID: Stanley Jimenez, Stanley   DOB: 1955/06/27, 66 y.o.   MRN: [id number] Team Conference Report to Patient/Family  Team Conference discussion was reviewed with the patient and caregiver, including goals, any changes in plan of care and target discharge date.  Patient and caregiver express understanding and are in agreement.  The patient has a target discharge date of 07/07/21.  Sw met with patient and provided updates. Sw made attempt to call patient brother [name]). Phone call sent to VM, sw will follow up.  Dyanne Iha 07/01/2021, 1:32 PM

## 2021-07-01 NOTE — Progress Notes (Signed)
Speech Language Pathology Weekly Progress and Session Note  Patient Details  Name: Stanley Jimenez MRN: [id number] Date of Birth: Nov 06, 1955  Beginning of progress report period: June 24, 2021 End of progress report period: July 01, 2021  Today's Date: 07/01/2021 SLP Individual Time: 2263-3354 SLP Individual Time Calculation (min): 40 min and Today's Date: 07/01/2021 SLP Missed Time: 20 Minutes Missed Time Reason: Patient fatigue;Pain  Short Term Goals: Week 1: SLP Short Term Goal 1 (Week 1): Patient will express wants/needs via multimodal communication with mod-to-max A verbal/visual cues SLP Short Term Goal 1 - Progress (Week 1): Met SLP Short Term Goal 2 (Week 1): Patient will use picture/word based communication board during 50% of opportunities with mod-to-max A verbal/visual cues SLP Short Term Goal 2 - Progress (Week 1): Met SLP Short Term Goal 3 (Week 1): Patient will verbalize/approximate at the word level in 50% of opportunities with mod-to-max A multimodal cues SLP Short Term Goal 3 - Progress (Week 1): Progressing toward goal SLP Short Term Goal 4 (Week 1): Patient will follow 1-2 step directions with 75% accuracy with min-to-mod A verbal/visual cues SLP Short Term Goal 4 - Progress (Week 1): Progressing toward goal SLP Short Term Goal 5 (Week 1): Patient will respond to basic yes/no questions regarding wants/needs in 90% of occasions with mod A verbal cues SLP Short Term Goal 5 - Progress (Week 1): Progressing toward goal SLP Short Term Goal 6 (Week 1): Patient will tolerate current diet with effective mastication, oral clearance, and minimal-to-no overt s/s of aspiration and min A cues before consideration of diet advancement. SLP Short Term Goal 6 - Progress (Week 1): Met  New Short Term Goals: Week 2: SLP Short Term Goal 1 (Week 2): STG=LTG due to ELOS  Weekly Progress Updates: Patient has made slow gains and has met 3 of 6 STGs this reporting period. Progress and  participation has been limited by fatigue and discomfort. Therapy team and MD aware and addressing. Patient currently requires min-to-mod A for comprehension of basic questions and following 1-step commands. Patient demonstrates improvement with comprehension and consistency of yes/no responses and typically responds with head nods and/or verbalizations. 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 Pt making consistent verbalizations however majority of speech is unintelligible and limited by perseverations. With mod-to-max A skilled cueing by SLP, patient is able to overcome communication breakdowns with use of communication aids including picture board and visual pain scale. Pt also known to utilize gestures however these are difficult to understand at times. Patient demonstrates improved ability to imitate oral motor movements and verbally produce CV (consonant-vowel) words with max A multimodal cues. Patient is currently consuming a Dys 2 diet and thin liquids with minimal-to-no observed s/sx of aspiration. Patient has participated in dys 3 trials however continues to exhibit oral deficits impacting mastication effectiveness, efficiency, oral transit, and oral clearance supporting clinical reasoning to remain on dys 2 diet at this time. PO intake has been limited secondary to fatigue and frequent abdominal discomfort accompanied by hiccups/belching following meals. MD/NSG aware. Patient and family education is ongoing. Patient would benefit from continued skilled SLP intervention to maximize speech/language and swallow functioning prior to discharge. Patient would benefit from ongoing SLP services at next level of care and 24 hour supervision. Long-term communication goals were downgraded to min-to-mod A level for comprehension, and mod-to-max A level for expression secondary to slower than anticipated progress.   Intensity:  Minumum of 1-2 x/day,  30 to 90 minutes Frequency: 3 to 5 out of 7 days Duration/Length of Stay: 2/14 Treatment/Interventions: Environmental controls;Internal/external aids;Speech/Language facilitation;Cueing hierarchy;Dysphagia/aspiration precaution training;Functional tasks;Patient/family education;Therapeutic Activities;Multimodal communication approach  Daily Session Skilled Therapeutic Interventions: Skilled ST treatment focused on communication goals. Patient greeted supine in bed and agreeable to SLP intervention. Upon sitting up and moving EOB, patient immediately grabbed his neck, exhibited facial grimace, and made loud vocalizations suggestive of intense pain. This occurred each time patient attempted to sit upright. Encouraged patient to lie down semi reclined. He naturally laid on right side, resting head on pillow, and continued to hold his neck. Patient pointed to 8/10 pain on visual pain scale with sup A. SLP notified RN and meds administered during session. Following ~1 minute after consuming crushed meds in applesauce, patient exhibited hiccups and belching. He shook his head "yes" to indicate he may need to vomit. This did not occur. Hiccups/belching resolved following ~5 minute duration. Pt communicated needs by responding to yes/no questions both verbally and via head nods with improved effectiveness and accuracy this date with overall min A for clarification. Pt used gestures and unintelligible vocalizations to communicate needs with overall max A without use of external aids, and mod-to-max A with use of pain scale and communication board with field of 4 pictures. Pt covered his head with blanket and indicated preference to rest. Placed emesis bag next to patient and asked him to press call button if he feels nauseous or has emesis episode. Pt demonstrated understanding by pointing to appropriate button on call bell. Left HOB >30 degrees. All immediate needs met. Notified nurse of  hiccups/belching and possible nausea. Pt missed 20 minutes of therapy.      General    Pain Pain Assessment Pain Scale: Faces Pain Score: 8  Faces Pain Scale: Hurts whole lot Pain Type: Acute pain Pain Location: Neck Pain Orientation: Right;Left Pain Descriptors / Indicators: Aching Pain Onset: Unable to tell Pain Intervention(s): RN made aware;Repositioned;Emotional support  Therapy/Group: Individual Therapy  Patty Sermons 07/01/2021, 4:09 PM

## 2021-07-02 ENCOUNTER — Inpatient Hospital Stay (HOSPITAL_COMMUNITY): Payer: Medicaid Other

## 2021-07-02 ENCOUNTER — Encounter (HOSPITAL_COMMUNITY): Payer: Medicaid Other

## 2021-07-02 LAB — PROTIME-INR
INR: 3.3 — ABNORMAL HIGH (ref 0.8–1.2)
Prothrombin Time: 33.5 seconds — ABNORMAL HIGH (ref 11.4–15.2)

## 2021-07-02 NOTE — Progress Notes (Signed)
Occupational Therapy Weekly Progress Note  Patient Details  Name: Stanley Jimenez MRN: [id number] Date of Birth: 01-Oct-1955  Beginning of progress report period: June 25, 2021 End of progress report period: July 02, 2021  Today's Date: 07/02/2021 OT Individual Time: 0702-0727 OT Individual Time Calculation (min): 25 min  + 24 min and Today's Date: 07/02/2021 OT Missed Time: 30 Minutes Missed Time Reason: Pain   No formal STG initially set due to longer than initially anticipated LOS. Pt has made slow progress this week towards LTG. Pt presently completing UB and LB dressing at CGA to min A level, ambulatory toilet transfers and toileting with no AD and CGA, grooming tasks with CGA and min cuing in stance at sink. Pt cont to be primarily limited by mild apraxia, decreased dynamic standing balance, severe expressive language deficits, and ongoing posterior neck pain and generalized feelings of discomfort limiting time able to participate in therapy. Anticipate 24/7 S and CGA to min A physical assist required upon DC to facilitate ADL. Family education planned to complete prior to DC on 07/07/21.   Patient continues to demonstrate the following deficits: muscle weakness, decreased cardiorespiratoy endurance, decreased coordination, decreased attention to right and ideational apraxia, decreased attention, decreased awareness, decreased problem solving, decreased safety awareness, decreased memory, and delayed processing, and decreased standing balance, decreased postural control, and decreased balance strategies and therefore will continue to benefit from skilled OT intervention to enhance overall performance with BADL, iADL, and Reduce care partner burden.  Patient not progressing toward long term goals.  See goal revision..  Plan of care revisions: LBD goal downgraded to min due to slower than anticipated progress.  OT Short Term Goals Week 1:  OT Short Term Goal 1 (Week 1): STG = LTG 2/2  ELOS Week 2:  OT Short Term Goal 1 (Week 2): STG = LTG 2/2 ELOS  Skilled Therapeutic Interventions/Progress Updates:    Session 1 ([id number]) : Pt received awake supine in bed, initially agreeable to therapy. Session focus on self-care retraining, activity tolerance, R attention in prep for improved ADL/IADL/func mobility performance + decreased caregiver burden. Pt doffed gripper socks and donned personal socks with S at bed level, came to sitting EOB and returned to supine throughout session with independence. Declined need for toilet, but agreeable to eat breakfast EOB. Self-fed 1 bite, able to open milk and locate items on his R side of tray with set-up A of materials and S for swallowing strategies per SLP sheet. Pt continues to rub back of neck and grimace. Pt able to indicate request for pain rx with head nods, LPN notified and present to administer pain rx. Pt continued to decline OOB activity, returning to supine and shaking head no at offered activities. Briefly discussed need for family education and DC date, pt nodded head in understanding, attempting to communicate, but words not unintelligible. Only intelligable word throughout session being "shit." Pt missed 30 min of therapy due to pain/unwilling to participate.  Pt left supine in bed with bed alarm engaged, call bell in reach, and all immediate needs met.    Session 2 [phone]): Pt received semi-reclined in bed with sister present. Appears comfortable, but declining OOB therapy. Per chart, NSG to see pt to assess C spine stability later today. Session focus on family education with sister. Reviewed current impairments and impact on current functional mobility and ADL performance. Discussed home  set-up and rec DME and f/u therapy. Sister with preference for [facility] due to transport being potentially  very taxing to pt in setting of cx dx.   Reviewed decreased safe home set-up to decrease falls risk at home, strategies to facilitate daily  routines, and common s/sx stroke; oriented to stroke binder in room. Sister interested in palliative care consult - will update MD. Sister with no additional questions at this time, brother plans to attend formal session this upcoming Monday.  Pt left semi-reclined in bed with sister present, bed alarm engaged, call bell in reach, and all immediate needs met.     Therapy Documentation Precautions:  Precautions Precautions: Fall Precaution Comments: expressive > receptive aphasia, R inattention Restrictions Weight Bearing Restrictions: No  Pain: see session notes   ADL: See Care Tool for more details.   Therapy/Group: Individual Therapy  Volanda Napoleon MS, OTR/L  07/02/2021, 6:56 AM

## 2021-07-02 NOTE — Telephone Encounter (Signed)
Dan aware

## 2021-07-02 NOTE — Progress Notes (Signed)
Physical Therapy Session Note  Patient Details  Name: Stanley Jimenez MRN: [id number] Date of Birth: Jul 04, 1955  Today's Date: 07/02/2021 PT Individual Time: 0902-0935 PT Individual Time Calculation (min): 33 min   Short Term Goals: Week 1:  PT Short Term Goal 1 (Week 1): Pt will transfer to [facility] with CGA PT Short Term Goal 2 (Week 1): Pt will ambulate 164f with CGA and LRAD PT Short Term Goal 3 (Week 1): Pt will attend to RUE >50% of time with min cues PT Short Term Goal 4 (Week 1): Pt will complete Berg balance Scale Week 2:     Skilled Therapeutic Interventions/Progress Updates:   Pt received supine in bed and agreeable to PT. Pt nodding yes when asked for need to use restroom. Ambulatory transfer to toilet in bathroom. Pt attempted to have Bowel movement sitting on toilet. Pt performed pericare following urination, but no noted BM in toilet. Pt returned to bed. Using communication board to rate pain in neck 9/10, and pointing to doctor on board. PT read most recent MD note and reports for cervical Xray. RN reports transport team will be ready to pick up patient in the near future. Pt left supine in bed with call bell in reach and all needs met.      Therapy Documentation Precautions:  Precautions Precautions: Fall Precaution Comments: expressive > receptive aphasia, R inattention Restrictions Weight Bearing Restrictions: No General: PT Amount of Missed Time (min): 27 Minutes PT Missed Treatment Reason: Pain   Therapy/Group: Individual Therapy  ALorie Phenix2/01/2022, 11:12 PM

## 2021-07-02 NOTE — Progress Notes (Signed)
Patient ID: Stanley Jimenez, male   DOB: Oct 26, 1955, 66 y.o.   MRN: [id number]  SW made another attempt to contact patient brother [name]). Left voicemail x2. SW made contact with patient sister, Hilda Blades who instructed to contact patient brother Donnie. Sw reached out to [facility], no answer. SW will continue to follow up.

## 2021-07-02 NOTE — Plan of Care (Signed)
°  Problem: RH Dressing Goal: LTG Patient will perform lower body dressing w/assist (OT) Description: LTG: Patient will perform lower body dressing with assist, with/without cues in positioning using equipment (OT) Flowsheets (Taken 07/02/2021 0734) LTG: Pt will perform lower body dressing with assistance level of: (Downgraded due to slower than anticipated progress.) Minimal Assistance - Patient > 75%

## 2021-07-02 NOTE — Progress Notes (Signed)
ANTICOAGULATION CONSULT NOTE - Follow Up Consult  Pharmacy Consult for Coumadin Indication:  CVA + DVT  No Known Allergies  Patient Measurements:   Heparin Dosing Weight:    Vital Signs: Temp: 97.9 F (36.6 C) (02/09 0538) Temp Source: Oral (02/09 0538) BP: 131/76 (02/09 0538) Pulse Rate: 87 (02/09 0538)  Labs: Recent Labs    06/30/21 0517 07/01/21 0406 07/02/21 0603  HGB 8.4*  --   --   HCT 26.5*  --   --   PLT 338  --   --   LABPROT 22.3* 27.2* 33.5*  INR 2.0* 2.5* 3.3*  HEPARINUNFRC 0.41  --   --      Estimated Creatinine Clearance: 67 mL/min (by C-G formula based on SCr of 0.89 mg/dL).  Assessment:   Anticoag:    Anticoag:  Hep gtt >> warfarin for stroke, RIJ DVT (cancer), warfarin started 1/30 Hgb 8.4, Plt 119>338 INR up to 2>2.5>3.3    Goal of Therapy:  INR 2-3   Plan:  Hold warfarin tonight Daily INR   Gloriajean Okun S. Alford Highland, PharmD, BCPS Clinical Staff Pharmacist [email or web address]  Alford Highland, Pasqual Farias Stillinger 07/02/2021,11:32 AM

## 2021-07-02 NOTE — Progress Notes (Signed)
Physical Therapy Session Note  Patient Details  Name: Stanley Jimenez MRN: [id number] Date of Birth: 1955/06/03  Today's Date: 07/02/2021      Short Term Goals: Week 1:  PT Short Term Goal 1 (Week 1): Pt will transfer to [facility] with CGA PT Short Term Goal 2 (Week 1): Pt will ambulate 194ft with CGA and LRAD PT Short Term Goal 3 (Week 1): Pt will attend to RUE >50% of time with min cues PT Short Term Goal 4 (Week 1): Pt will complete Berg balance Scale  Skilled Therapeutic Interventions/Progress Updates: Pt presented in bed awake/alert. Pt grimacing upon introduction. Pt shaking head no when asked to participate in therapy. Pt able to communicate using pain chart pain 8-9/10 at neck. Pt has received pain medications. Pt missed 30 min skilled PT due to pain/refusal.     Therapy Documentation Precautions:  Precautions Precautions: Fall Precaution Comments: expressive > receptive aphasia, R inattention Restrictions Weight Bearing Restrictions: No General: PT Amount of Missed Time (min): 30 Minutes PT Missed Treatment Reason: Pain;Patient unwilling to participate    Therapy/Group: Individual Therapy  Yadier Bramhall Tarshia Kot, PTA  07/02/2021, 10:40 AM

## 2021-07-02 NOTE — Progress Notes (Signed)
Patient ID: Stanley Jimenez, male   DOB: 1955/12/27, 66 y.o.   MRN: [id number] Team Conference Report to Patient/Family  Team Conference discussion was reviewed with the patient and caregiver, including goals, any changes in plan of care and target discharge date.  Patient and caregiver express understanding and are in agreement.  The patient has a target discharge date of 07/07/21.  Sw spoke to patient brother Stanley Jimenez) provided conference updates. Informed him of patient recommendations. Family education scheduled on Monday 2/13 1-4 Pm. Family aware of 24/7 supervision recommendation.  Dyanne Iha 07/02/2021, 11:03 AM

## 2021-07-02 NOTE — Progress Notes (Signed)
SLP Cancellation Note  Patient Details Name: Stanley Jimenez MRN: [id number] DOB: 01-19-1956   Cancelled treatment:       Patient missed 45 minutes of skilled SLP intervention. Upon arrival, patient was awake in bed and immediately began shaking his head no. SLP provided education regarding importance of participating to maximize communication. Patient grunting and indicating pain in neck (patient premedicated) and continuing to decline participation. Patient left in bed with all needs within reach.                                                                                                 Jersey Espinoza 07/02/2021, 2:29 PM

## 2021-07-02 NOTE — Progress Notes (Signed)
Patient ID: Stanley Jimenez, male   DOB: 1955/11/30, 66 y.o.   MRN: [id number]  Shower Chair ordered through [place]

## 2021-07-02 NOTE — Progress Notes (Signed)
Patient ID: Stanley Jimenez, male   DOB: March 28, 1956, 66 y.o.   MRN: [id number]  Patient [name] referral sent to [facility]

## 2021-07-02 NOTE — Progress Notes (Addendum)
PROGRESS NOTE   Subjective/Complaints:  Pt points to neck an dback of head, facial grimacing   ROS-+food regurgitation as per SLP, +decreased appetite.  Objective:   DG Abd Portable 1V  Result Date: 06/30/2021 CLINICAL DATA:  Abdominal pain EXAM: PORTABLE ABDOMEN - 1 VIEW COMPARISON:  None. FINDINGS: Nonobstructive bowel gas pattern. There is a small calcification measuring 4 mm overlying the left renal shadow and tiny calcifications overlying the right renal shadow. Vascular calcifications. Prostate brachytherapy seeds. There are multiple lytic lesions seen within the spine, ribs, pelvis, and proximal femurs consistent with known diffuse lytic osseous metastatic disease. Subacute pathologic fractures of multiple lower ribs. IMPRESSION: No evidence of bowel obstruction. Diffuse lytic osseous metastatic disease with multiple subacute pathologic lower rib fractures bilaterally. Small calcifications overlying the kidneys bilaterally, consistent with small renal stones. Electronically Signed   By: Maurine Simmering M.D.   On: 06/30/2021 15:21   Recent Labs    06/30/21 0517  WBC 14.6*  HGB 8.4*  HCT 26.5*  PLT 338    No results for input(s): NA, K, CL, CO2, GLUCOSE, BUN, CREATININE, CALCIUM in the last 72 hours.   Intake/Output Summary (Last 24 hours) at 07/02/2021 0854 Last data filed at 07/02/2021 0730 Gross per 24 hour  Intake 0 ml  Output --  Net 0 ml          Physical Exam: Vital Signs Blood pressure 131/76, pulse 87, temperature 97.9 F (36.6 C), temperature source Oral, resp. rate 16, SpO2 100 %.   General: No acute distress Mood and affect are appropriate Heart: Regular rate and rhythm no rubs murmurs or extra sounds Lungs: Clear to auscultation, breathing unlabored, no rales or wheezes Abdomen: Positive bowel sounds, soft nontender to palpation, nondistended Extremities: No clubbing, cyanosis, or edema Skin: No  evidence of breakdown, no evidence of rash   Neurologic: Cranial nerves II through XII intact, motor strength is 5/5 in L and 4/5 Right deltoid, bicep, tricep, grip, hip flexor, knee extensors, ankle dorsiflexor and plantar flexor Sensory exam cannot assess due to aphasia  Aphasia Exp> receptive, can follow simple commands point to white board when asked his name  Musculoskeletal: Full range of motion in all 4 extremities. Pain with C spine ROM, flex and ext  Sit to supine with supervision    Assessment/Plan: 1. Functional deficits which require 3+ hours per day of interdisciplinary therapy in a comprehensive inpatient rehab setting. Physiatrist is providing close team supervision and 24 hour management of active medical problems listed below. Physiatrist and rehab team continue to assess barriers to discharge/monitor patient progress toward functional and medical goals  Care Tool:  Bathing    Body parts bathed by patient: Buttocks   Body parts bathed by helper: Buttocks, Front perineal area Body parts n/a: Right arm, Left arm, Chest, Abdomen, Right upper leg, Left upper leg, Right lower leg, Left lower leg, Front perineal area, Buttocks   Bathing assist Assist Level: Maximal Assistance - Patient 24 - 49%     Upper Body Dressing/Undressing Upper body dressing   What is the patient wearing?: Pull over shirt    Upper body assist Assist Level: Minimal Assistance - Patient >  75%    Lower Body Dressing/Undressing Lower body dressing      What is the patient wearing?: Incontinence brief, Pants     Lower body assist Assist for lower body dressing: Minimal Assistance - Patient > 75%     Toileting Toileting    Toileting assist Assist for toileting: Contact Guard/Touching assist     Transfers Chair/bed transfer  Transfers assist     Chair/bed transfer assist level: Contact Guard/Touching assist     Locomotion Ambulation   Ambulation assist      Assist level:  Contact Guard/Touching assist Assistive device: No Device Max distance: 200'   Walk 10 feet activity   Assist     Assist level: Minimal Assistance - Patient > 75% Assistive device: Walker-rolling   Walk 50 feet activity   Assist    Assist level: Minimal Assistance - Patient > 75% Assistive device: Walker-rolling    Walk 150 feet activity   Assist    Assist level: Minimal Assistance - Patient > 75% Assistive device: Walker-rolling    Walk 10 feet on uneven surface  activity   Assist     Assist level: Minimal Assistance - Patient > 75% Assistive device: Walker-rolling   Wheelchair     Assist Is the patient using a wheelchair?: No             Wheelchair 50 feet with 2 turns activity    Assist            Wheelchair 150 feet activity     Assist          Blood pressure 131/76, pulse 87, temperature 97.9 F (36.6 C), temperature source Oral, resp. rate 16, SpO2 100 %.  Medical Problem List and Plan: 1. Functional deficits secondary to multiple bilateral acute infarcts largest located left frontal and occipital lobe likely secondary to embolic disease 3/41/93  versus hypercoagulability from esophageal cancer             -patient may shower             -ELOS/Goals: 2/14             Continue CIR Rib mets, left sternal met as well as left glenoid met no resistance training LUE - no pain with UE AROM   -Interdisciplinary Team Conference today   2.  Antithrombotics: -DVT/anticoagulation:  Pharmaceutical: Coumadin INR 3.3 no signs of bleeding , pharmacy to adjust per protocol              -antiplatelet therapy: none  3. Pain Management: Tylenol as needed. Lidocaine patch added Difficult to assess due to severe exp aphasia but on exam most consistent wit neck pain , given hx of osseous mets will check C spine xray , last xray was 2013 pre cancer, showed degenerative changes with spondylolisthesis, degenerative most pronounced at C 3-4 , start  sports cream  4. Mood: Provide emotional support             -antipsychotic agents: N/A 5. Neuropsych: This patient is not capable of making decisions on his own behalf. 6. Skin/Wound Care: Routine skin checks 7. Hyponatremia: repeat Na tomorrow.  8.  Acute thrombus right IJ.  anticoagulation on IV heparin , until INR >2.0, 2/3 INR 1.9  9.  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  Not a candidate for intervention due to pre-existing metastatic cancer 10.  Thrombocytopenia/acute on chronic anemia/leukocytosis.  Follow-up CBC 11.  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  Follow-up chemotherapy as outpatient 12.  Prostate cancer with radioactive seed implant.  Follow-up outpatient.  Continue Flomax 0.4 mg daily 13.  Hypertension.  Well controlled. Monitor with increased mobility.  Home medication Norvasc 10 mg daily prior to admission.  Resume as needed.  Vitals:   07/01/21 2031 07/02/21 0538  BP: 124/70 131/76  Pulse: 86 87  Resp: 16 16  Temp: 98 F (36.7 C) 97.9 F (36.6 C)  SpO2: 100% 100%  D/c coreg given hypotension 14.  Paroxysmal SVT.  Tachycardic. Continue Coreg 3.158m BID.  15. Prediabetes: Hgb A1c 6.3 on 06/21/21- prior CBGs well controlled, not monitoring in rehab.  16. Thrush: start nystatin 17. Reflux of food:  KUB shows no obstruction, discussed with patient 18. Decreased appetite: start megace 19. Kidney stones: IVF overnight   Xray showing C2 spinous process fx which is stable  but also possible linear fx C3 vertebral body, place in hard collar and do bedside tx pending NS consult  LOS: 8 days A FACE TO FACE EVALUATION WAS PERFORMED  ACharlett Blake2/01/2022, 8:54 AM

## 2021-07-03 ENCOUNTER — Other Ambulatory Visit: Payer: Self-pay | Admitting: Radiation Therapy

## 2021-07-03 ENCOUNTER — Inpatient Hospital Stay (HOSPITAL_COMMUNITY): Payer: Medicaid Other

## 2021-07-03 LAB — PROTIME-INR
INR: 2.9 — ABNORMAL HIGH (ref 0.8–1.2)
Prothrombin Time: 30.5 seconds — ABNORMAL HIGH (ref 11.4–15.2)

## 2021-07-03 MED ORDER — CHLORHEXIDINE GLUCONATE CLOTH 2 % EX PADS: 6.0000 | MEDICATED_PAD | Freq: Two times a day (BID) | CUTANEOUS | Status: DC

## 2021-07-03 MED ORDER — WARFARIN 0.5 MG HALF TABLET
0.5000 mg | ORAL_TABLET | Freq: Once | ORAL | Status: AC
  Administered 2021-07-03: 0.5 mg via ORAL
  Filled 2021-07-03: qty 1

## 2021-07-03 NOTE — Progress Notes (Signed)
ANTICOAGULATION CONSULT NOTE - Follow Up Consult  Pharmacy Consult for Coumadin Indication:  CVA + DVT  No Known Allergies  Patient Measurements:   Heparin Dosing Weight:    Vital Signs: Temp: 98.1 F (36.7 C) (02/10 0621) BP: 129/71 (02/10 0621) Pulse Rate: 86 (02/10 0621)  Labs: Recent Labs    07/01/21 0406 07/02/21 0603 07/03/21 0507  LABPROT 27.2* 33.5* 30.5*  INR 2.5* 3.3* 2.9*     Estimated Creatinine Clearance: 67 mL/min (by C-G formula based on SCr of 0.89 mg/dL).  Assessment:   Anticoag:    Anticoag:  Hep gtt >> warfarin for stroke, RIJ DVT (cancer), warfarin started 1/30 Hgb 8.4, Plt 119>338 INR up to 2>2.5>3.3>2.9   Goal of Therapy:  INR 2-3   Plan:  Warfain 0.5mg  po x 1 tonight Daily INR   Krystofer Hevener S. Alford Highland, PharmD, BCPS Clinical Staff Pharmacist [email or web address]  Alford Highland, [employer] 07/03/2021,7:38 AM

## 2021-07-03 NOTE — Progress Notes (Signed)
Physical Therapy Session Note  Patient Details  Name: Stanley Jimenez MRN: [id number] Date of Birth: December 18, 1955  Today's Date: 07/03/2021 PT Individual Time: 1415-1440 PT Individual Time Calculation (min): 25 min   Short Term Goals: Week 2:  PT Short Term Goal 1 (Week 2): STG=LTG due to ELOS  Skilled Therapeutic Interventions/Progress Updates:   Pt received supine sitting in recliner with head/neck in full cervical flexion and back resting in seat of chair. PT noted that pt in chair without hard collar, despite conversation with PA this AM for need hard collar per MD note. RN notified. Lateral scoot transfer to bed with supervision assist to reduce fall risk. Educated pt on need for hard collar to protect cervical fx, unsure of retention of education.  Pt left supine in bed with call bell in reach and all needs met.       Therapy Documentation Precautions:  Precautions Precautions: Fall Precaution Comments: expressive > receptive aphasia, R inattention Restrictions Weight Bearing Restrictions: No General: PT Amount of Missed Time (min): 35 Minutes PT Missed Treatment Reason: Pain;Patient fatigue Vital Signs: Therapy Vitals Temp: 97.9 F (36.6 C) Pulse Rate: (!) 102 Resp: 18 BP: 128/65 Patient Position (if appropriate): Lying Oxygen Therapy SpO2: 100 % O2 Device: Room Air Pain: Pain Assessment Pain Scale: 0-10 Pain Score: 0-No pain Faces Pain Scale: No hurt    Therapy/Group: Individual Therapy  [name] 07/03/2021, 11:34 PM

## 2021-07-03 NOTE — Progress Notes (Signed)
Patient ID: Stanley Jimenez, male   DOB: 1956/03/26, 66 y.o.   MRN: [id number]  Cervical radiographs and cervical CT reviewed. Diffuse metastatic disease without any acute or unstable pathology. I would recommend the patient wear a hard cervical collar for comfort if he is having neck pain. He will need to f/u with his oncologist/rad onc. There may be a role for palliative radiation if there is persistent neck pain. Due to the diffuse disease, palliative care consult could be considered.   Marvis Moeller, DNP, NP-C Neurosurgery 07/03/2021 5:01 PM

## 2021-07-03 NOTE — Progress Notes (Addendum)
PROGRESS NOTE   Subjective/Complaints:  Remains severely aphasic, spoke to NS regarding imaging as well as brother Boone Master obtain due to aphasia   Objective:   DG Cervical Spine 2 or 3 views  Addendum Date: 07/02/2021   ADDENDUM REPORT: 07/02/2021 10:57 ADDENDUM: There is possible radiolucent line in 1 of the pedicles in the C4 vertebra. Possibly a fracture is not excluded. Attempt was made to reach Dr. Olevia Bowens. Imaging finding of new pathological fractures were relayed to patient's nurse Cheri Guppy by telephone call at 10:53 a.m. on 07/02/2021. Electronically Signed   By: Elmer Picker M.D.   On: 07/02/2021 10:57   Result Date: 07/02/2021 CLINICAL DATA:  Neck pain EXAM: CERVICAL SPINE - 2-3 VIEW COMPARISON:  04/27/2012 FINDINGS: There is 1.5 cm radiolucency in the spinous process of C2 vertebra which was not seen in the previous examination. There is break in the cortical margin in the inferior margin of the spinous process of C2 vertebra. There is faint low attenuation in the anterior aspect of body of C3 vertebra. There is questionable linear radiolucency in the inferior aspect of body of C3 vertebra. Degenerative changes are noted with bony spurs and facet hypertrophy from C3-C7 levels with interval worsening. There is minimal anterolisthesis at C3-C4 level which has not changed. Prevertebral soft tissues are unremarkable. There is interval placement of right IJ chest port. IMPRESSION: There is interval appearance of 1.5 cm lytic lesion in the spinous process of C2 vertebra with pathological fracture. There is faint low-density in the anterior aspect of body of C3 vertebra with possible linear radiolucent fracture line in the lower endplate. Possibility of neoplastic process such as metastatic disease or multiple myeloma with pathological fractures should be considered. Cervical spondylosis with bony spurs and disc space  narrowing from C3-C7 levels with interval worsening. Electronically Signed: By: Elmer Picker M.D. 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 EXAM: CT CERVICAL SPINE WITHOUT CONTRAST TECHNIQUE: Multidetector CT imaging of the cervical spine was performed without intravenous contrast. Multiplanar CT image reconstructions were also generated. RADIATION DOSE REDUCTION: This exam was performed according to the departmental dose-optimization program which includes automated exposure control, adjustment of the mA and/or kV according to patient size and/or use of iterative reconstruction technique. COMPARISON:  06/20/2021 CTA of the neck. FINDINGS: Alignment: Straightening of the cervical spine with slight anterolisthesis at C3-4 and retrolisthesis at C5-6. Skull base and vertebrae: Lytic bone lesions affecting the clivus and essentially every visible vertebrae, although subtle disease at C1 and indeterminate sclerotic appearance at C5. Large lytic lesion in the C2 spinous process is complicated by fracture that is nondisplaced. Superior endplate fracture also seen at T2 and T3, likely chronic. Nondisplaced T2 left transverse process fracture. Epidural tumor is possible especially at the level of the C2 spinous process lesion. No gross cord compression. Scattered metastases in the bilateral cervical ribs where covered. Soft tissues and spinal canal: Masslike thickening at the right jugular chain measuring 2.3 cm, not well defined without contrast. Right-sided porta catheter in unremarkable position. Small left pleural effusion. Disc levels:  Negative Upper chest: Negative IMPRESSION: 1. Generalized osseous metastatic disease in the visible spine, clivus, and upper ribs. 2. Nondisplaced pathologic fractures at the C2 spinous process, T2/T3 superior endplates, and T2 left transverse process. Fractures are  likely nonacute. 3. Dorsal epidural tumor without cord impingement is possible at C2. 4. Adenopathy in the right neck. Electronically Signed   By: Jorje Guild M.D.   On: 07/03/2021 06:18   No results for input(s): WBC, HGB, HCT, PLT in the last 72 hours.  No results for input(s): NA, K, CL, CO2, GLUCOSE, BUN, CREATININE, CALCIUM in the last 72 hours.   Intake/Output Summary (Last 24 hours) at 07/03/2021 0754 Last data filed at 07/02/2021 1800 Gross per 24 hour  Intake 290 ml  Output --  Net 290 ml          Physical Exam: Vital Signs Blood pressure 129/71, pulse 86, temperature 98.1 F (36.7 C), resp. rate 17, SpO2 100 %.   General: No acute distress Mood and affect are appropriate Heart: Regular rate and rhythm no rubs murmurs or extra sounds Lungs: Clear to auscultation, breathing unlabored, no rales or wheezes Abdomen: Positive bowel sounds, soft nontender to palpation, nondistended Extremities: No clubbing, cyanosis, or edema Skin: No evidence of breakdown, no evidence of rash   Neurologic: Cranial nerves II through XII intact, motor strength is 5/5 in L and 4/5 Right deltoid, bicep, tricep, grip, hip flexor, knee extensors, ankle dorsiflexor and plantar flexor Sensory exam cannot assess due to aphasia  Aphasia Exp> receptive, can follow simple commands point to white board when asked his name  Musculoskeletal: Pain to palp C 2 spinous process     Assessment/Plan: 1. Functional deficits which require 3+ hours per day of interdisciplinary therapy in a comprehensive inpatient rehab setting. Physiatrist is providing close team supervision and 24 hour management of active medical problems listed below. Physiatrist and rehab team continue to assess barriers to discharge/monitor patient progress toward functional and medical goals  Care Tool:  Bathing    Body parts bathed by patient: Buttocks   Body parts bathed by helper: Buttocks, Front perineal area Body parts n/a:  Right arm, Left arm, Chest, Abdomen, Right upper leg, Left upper leg, Right lower leg, Left lower leg, Front perineal area, Buttocks   Bathing assist Assist Level: Maximal Assistance - Patient 24 - 49%     Upper Body Dressing/Undressing Upper body dressing   What is the patient wearing?: Pull over shirt    Upper body assist Assist Level: Minimal Assistance - Patient > 75%    Lower Body Dressing/Undressing Lower body dressing      What is the patient wearing?: Incontinence brief, Pants     Lower body assist Assist for lower body dressing: Minimal Assistance - Patient > 75%     Toileting Toileting    Toileting assist Assist for toileting: Contact Guard/Touching assist     Transfers Chair/bed transfer  Transfers assist     Chair/bed transfer assist level: Contact Guard/Touching assist     Locomotion Ambulation   Ambulation assist      Assist level: Contact Guard/Touching assist Assistive device: No Device Max distance: 200'   Walk 10 feet activity   Assist     Assist level: Minimal Assistance - Patient > 75% Assistive device: Walker-rolling   Walk 50 feet activity   Assist    Assist level: Minimal Assistance - Patient > 75% Assistive device: Walker-rolling    Walk 150 feet activity   Assist  Assist level: Minimal Assistance - Patient > 75% Assistive device: Walker-rolling    Walk 10 feet on uneven surface  activity   Assist     Assist level: Minimal Assistance - Patient > 75% Assistive device: Walker-rolling   Wheelchair     Assist Is the patient using a wheelchair?: No             Wheelchair 50 feet with 2 turns activity    Assist            Wheelchair 150 feet activity     Assist          Blood pressure 129/71, pulse 86, temperature 98.1 F (36.7 C), resp. rate 17, SpO2 100 %.  Medical Problem List and Plan: 1. Functional deficits secondary to multiple bilateral acute infarcts largest located  left frontal and occipital lobe likely secondary to embolic disease 1/44/31  versus hypercoagulability from esophageal cancer             -patient may shower             -ELOS/Goals: 2/14             Continue CIR Rib mets, left sternal met as well as left glenoid met no resistance training LUE - no pain with UE AROM , cont bedside therapy in hard collar until Neurosurgery addresses C spine stability  Discussed C spine films with NS Dr Glenford Peers who advised CT C spine  and CT C spine with Dr Arnoldo Morale ( on call) with plans to have Dr Marcello Moores or Glenford Peers to f/u- no signs of myelopathy , may have C2 epidural tumor without spinal cord compromise, will involve palliative care   -Would like NS to address stability of C spine, non displace fx do not appear acute per neuroradiology report,    2.  Antithrombotics: -DVT/anticoagulation:  Pharmaceutical: Coumadin INR 3.3 no signs of bleeding , pharmacy to adjust per protocol              -antiplatelet therapy: none  3. Pain Management: Tylenol as needed. Lidocaine patch added Difficult to assess due to severe exp aphasia but on exam most consistent wit neck pain , given hx of osseous mets will check C spine xray , last xray was 2013 pre cancer, showed degenerative changes with spondylolisthesis, degenerative most pronounced at C 3-4 , has tenderness over C2 spinous process corresponding with met  4. Mood: Provide emotional support             -antipsychotic agents: N/A 5. Neuropsych: This patient is not capable of making decisions on his own behalf. 6. Skin/Wound Care: Routine skin checks 7. Hyponatremia: repeat Na tomorrow.  8.  Acute thrombus right IJ.  anticoagulation on IV heparin , until INR >2.0, 2/3 INR 1.9  9.  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  Not a candidate for intervention due to pre-existing metastatic cancer 10.  Thrombocytopenia/acute on chronic anemia/leukocytosis.  Follow-up CBC 11.  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 Hepatic and bone mets , as discussed with brother will ask Palliative to address [place]. Follow-up Dr.Katragadda have sent message to update.  Follow-up chemotherapy as outpatient 12.  Prostate cancer with radioactive seed implant.  Follow-up outpatient.  Continue Flomax 0.4 mg daily 13.  Hypertension.  Well controlled. Monitor with increased mobility.  Home medication Norvasc 10 mg daily prior to admission.  Resume as needed.  Vitals:   07/02/21 2313 07/03/21 0621  BP:  116/68 129/71  Pulse: 90 86  Resp: 18 17  Temp: 97.7 F (36.5 C) 98.1 F (36.7 C)  SpO2: 100% 100%  D/c coreg given hypotension 14.  Paroxysmal SVT.  Tachycardic. Continue Coreg 3.$RemoveBeforeDE'125mg'[id number]$  BID.  15. Prediabetes: Hgb A1c 6.3 on 06/21/21- prior CBGs well controlled, not monitoring in rehab.  16. Thrush: start nystatin 17. Reflux of food:  KUB shows no obstruction, discussed with patient 18. Decreased appetite: start megace 19. Kidney stones: IVF overnight    LOS: 9 days A FACE TO FACE EVALUATION WAS PERFORMED  Charlett Blake 07/03/2021, 7:54 AM

## 2021-07-03 NOTE — Progress Notes (Signed)
Occupational Therapy Session Note  Patient Details  Name: Stanley Jimenez MRN: [id number] Date of Birth: 1956/01/09  Today's Date: 07/03/2021 OT Individual Time: 0703-0759 OT Individual Time Calculation (min): 56 min  and Today's Date: 07/03/2021 OT Missed Time: 15 Minutes Missed Time Reason: Pain;Patient unwilling/refused to participate without medical reason   Short Term Goals: Week 1:  OT Short Term Goal 1 (Week 1): STG = LTG 2/2 ELOS Week 2:  OT Short Term Goal 1 (Week 2): STG = LTG 2/2 ELOS  Skilled Therapeutic Interventions/Progress Updates:    Pt received awake supine in bed, remains aphasic but appears agreeable to therapy. Session focus on self-care retraining, activity tolerance, functional cognition, transfer retraining in prep for improved ADL/IADL/func mobility performance + decreased caregiver burden. No immediate facial grimacing noted as in recent sessions. Came to sitting EOB with multiple attempts and close S, use of bed rails. Total A to don B shoes due to neck discomfort. Completed ambulatory toilet transfer no AD with CGA for steadying assist/ CGA for toileting tasks, but not void. Returned to EOB.   Pt doffed shirt with min A to pull over head, donned new shirt with min A to thread RUE due to R inattention. Doffed underwear/pants with S at bed level due to attempting to don new underwear over old underwear with poor awareness. Donned new underwear/pants via bridging at bed level with S and increased time. Pt returned to side-lying, agreeable to attempt bed side therapy tasks. No initiation with use of picture board, but later able to indicate 4/10 pain, but did not indicate which body part.  Pt participated in the following activities to target verbal command follow, sustained attention, visual perceptual skills/R visual scanning, apraxic deficits:  -identified 1/5 colors correctly in field of 5   -identified 5/5 numbers correctly in field of 5  -identified 1/5  numbers/color combinations correctly in field of 5  -matched 5/5 playing cards correctly in field of 5 *Commands given verbally, attempted written commands with poor comprehension.  Attempted to reconstruct level 1 difficulty lego designs, ultimately required total A to accurately complete design and manipulate lego blocks. Pt becoming frustrated, shaking his head and hand no; closed eyes. Pt missed 15 min of therapy due to refusal/feelings of discomfort.  Pt left semi-reclined in bed with bed alarm engaged, call bell in reach, and all immediate needs met.    Therapy Documentation Precautions:  Precautions Precautions: Fall Precaution Comments: expressive > receptive aphasia, R inattention Restrictions Weight Bearing Restrictions: No  Pain: 4/10 pain, did not indicate which body part ADL: See Care Tool for more details.   Therapy/Group: Individual Therapy  Volanda Napoleon MS, OTR/L  07/03/2021, 6:57 AM

## 2021-07-03 NOTE — Progress Notes (Signed)
Occupational Therapy Session Note  Patient Details  Name: Stanley Jimenez MRN: [id number] Date of Birth: 1956/01/05  Today's Date: 07/03/2021 OT Individual Time: [phone] OT Individual Time Calculation (min): 25 min    Short Term Goals: Week 2:  OT Short Term Goal 1 (Week 2): STG = LTG 2/2 ELOS  Skilled Therapeutic Interventions/Progress Updates:    Pt greeted semi-reclined in bed, and needed encouragement to try to get OOB to the recliner. Pt eventually agreeable to sit up. Pt completed bed mobility with supervision. Worked on donning shoes at EOB, but pt shaking head no to trying to assist. OT provided verbal education on how he could complete this task safely. Pt rested at EOB, then completed stand-pivot to the recliner with CGA. OT educated further regarding BADL participation at home and modifications for safety. Pt shook head yes, but was minimally verbal throughout session. Pt left seated in recliner with chair alarm pad on, call bell in reach, LE's elevated and needs met.   Therapy Documentation Precautions:  Precautions Precautions: Fall Precaution Comments: expressive > receptive aphasia, R inattention Restrictions Weight Bearing Restrictions: No  Pain:  Pt grimaced with pain in neck   Therapy/Group: Individual Therapy  Valma Cava 07/03/2021, 9:20 AM

## 2021-07-03 NOTE — Plan of Care (Signed)
°  Problem: RH Dressing Goal: LTG Patient will perform upper body dressing (OT) Description: LTG Patient will perform upper body dressing with assist, with/without cues (OT). Flowsheets (Taken 07/03/2021 1457) LTG: Pt will perform upper body dressing with assistance level of: Minimal Assistance - Patient > 75% Note: Downgraded due to slower than anticipated progress

## 2021-07-03 NOTE — Plan of Care (Signed)
°  Problem: Consults Goal: RH STROKE PATIENT EDUCATION Description: See Patient Education module for education specifics  Outcome: Progressing   Problem: RH BOWEL ELIMINATION Goal: RH STG MANAGE BOWEL WITH ASSISTANCE Description: STG Manage Bowel with mod I Assistance. Outcome: Progressing   Problem: RH BLADDER ELIMINATION Goal: RH STG MANAGE BLADDER WITH ASSISTANCE Description: STG Manage Bladder Without Assistance Outcome: Progressing   Problem: RH SAFETY Goal: RH STG ADHERE TO SAFETY PRECAUTIONS W/ASSISTANCE/DEVICE Description: STG Adhere to Safety Precautions With cues Assistance/Device. Outcome: Progressing   Problem: RH KNOWLEDGE DEFICIT Goal: RH STG INCREASE KNOWLEDGE OF DIABETES Description: Patient and brother will be able to manage prediabetes with medications and dietary modifications using handouts and educational resources independently Outcome: Progressing Goal: RH STG INCREASE KNOWLEDGE OF HYPERTENSION Description: Patient and brother will be able to manage HTN with medications and dietary modifications using handouts and educational resources independently Outcome: Progressing Goal: RH STG INCREASE KNOWLEDGE OF DYSPHAGIA/FLUID INTAKE Description: Patient and brother will be able to manage Dysphagia, medications and dietary modifications using handouts and educational resources independently Outcome: Progressing Goal: RH STG INCREASE KNOWLEGDE OF HYPERLIPIDEMIA Description: Patient and brother will be able to manage HLD with medications and dietary modifications using handouts and educational resources independently Outcome: Progressing Goal: RH STG INCREASE KNOWLEDGE OF STROKE PROPHYLAXIS Description: Patient and brother will be able to manage secondary stroke risks with medications and dietary modifications using handouts and educational resources independently Outcome: Progressing

## 2021-07-03 NOTE — Progress Notes (Signed)
Orthopedic Tech Progress Note Patient Details:  Stanley Jimenez 10/15/1955 [id number] Called order into [facility] Patient ID: [name], male   DOB: [birth year], 66 y.o.   MRN: [id number]  Chip Boer 07/03/2021, 2:55 PM

## 2021-07-03 NOTE — Progress Notes (Addendum)
Physical Therapy Weekly Progress Note  Patient Details  Name: Stanley Jimenez MRN: [id number] Date of Birth: 1955/12/08  Beginning of progress report period: June 25, 2021 End of progress report period: July 03, 2021  Today's Date: 07/03/2021 PT Individual Time: 7829-5621 PT Individual Time Calculation (min): 30 min   Patient has met 3 of 4 short term goals.  Pt is making stedy progress towards LTG but extremely limited by pain. Xray and cervical CT showing multiple pathological fx, see MD and XRay note. Pt has been transitioned to DQ due to continued limitation to participate from PT and fatigue. Family education planned to be performed on Monday 2/13. Pt is supervision assist from PT for all mobility with cues for safety and attention to the R.   Patient continues to demonstrate the following deficits muscle weakness and muscle joint tightness, decreased cardiorespiratoy endurance, decreased coordination, decreased visual acuity and field cut, decreased attention to right, decreased attention, decreased awareness, decreased problem solving, decreased safety awareness, decreased memory, and delayed processing, and decreased standing balance, decreased postural control, hemiplegia, and decreased balance strategies and therefore will continue to benefit from skilled PT intervention to increase functional independence with mobility.  Patient progressing toward long term goals..  Continue plan of care.  PT Short Term Goals Week 1:  PT Short Term Goal 1 (Week 1): Pt will transfer to [facility] with CGA PT Short Term Goal 1 - Progress (Week 1): Met PT Short Term Goal 2 (Week 1): Pt will ambulate 160ft with CGA and LRAD PT Short Term Goal 2 - Progress (Week 1): Met PT Short Term Goal 3 (Week 1): Pt will attend to RUE >50% of time with min cues PT Short Term Goal 3 - Progress (Week 1): Met PT Short Term Goal 4 (Week 1): Pt will complete Berg balance Scale PT Short Term Goal 4 - Progress (Week 1):  Not met Week 2:  PT Short Term Goal 1 (Week 2): STG=LTG due to ELOS  Skilled Therapeutic Interventions/Progress Updates:   Pt received supine in bed and agreeable to PT. MD orders for bed level therapy. Pt performed supine NMR SLR x 12, SAWQ 2 x 12, ankle pumps 2 x 20, heel slides x 12, hip abduction 2 x 12. Pt declines need for bowel movements. Pt left supine in bed declining desire to continue bed level LE strengthening.      Therapy Documentation Precautions:  Precautions Precautions: Fall Precaution Comments: expressive > receptive aphasia, R inattention Restrictions Weight Bearing Restrictions: No General: PT Amount of Missed Time (min): 30 Minutes PT Missed Treatment Reason: Pain;Patient fatigue Vital Signs: Therapy Vitals Temp: 98.9 F (37.2 C) Temp Source: Oral Pulse Rate: (!) 104 Resp: 18 BP: 116/70 Patient Position (if appropriate): Sitting Oxygen Therapy SpO2: 100 % Pain: Pain Assessment Pain Scale: 0-10 Pain Score: 0-No pain    Therapy/Group: Individual Therapy  [name] 07/03/2021, 1:44 PM

## 2021-07-04 DIAGNOSIS — M542 Cervicalgia: Secondary | ICD-10-CM

## 2021-07-04 DIAGNOSIS — R1030 Lower abdominal pain, unspecified: Secondary | ICD-10-CM

## 2021-07-04 DIAGNOSIS — Z7189 Other specified counseling: Secondary | ICD-10-CM

## 2021-07-04 DIAGNOSIS — C801 Malignant (primary) neoplasm, unspecified: Secondary | ICD-10-CM

## 2021-07-04 LAB — PROTIME-INR
INR: 2.6 — ABNORMAL HIGH (ref 0.8–1.2)
Prothrombin Time: 28 seconds — ABNORMAL HIGH (ref 11.4–15.2)

## 2021-07-04 MED ORDER — WARFARIN SODIUM 1 MG PO TABS
1.0000 mg | ORAL_TABLET | Freq: Once | ORAL | Status: AC
  Administered 2021-07-04: 1 mg via ORAL
  Filled 2021-07-04: qty 1

## 2021-07-04 MED ORDER — SENNOSIDES-DOCUSATE SODIUM 8.6-50 MG PO TABS
2.0000 | ORAL_TABLET | Freq: Every day | ORAL | Status: DC
  Administered 2021-07-04 – 2021-07-06 (×3): 2 via ORAL
  Filled 2021-07-04 (×3): qty 2

## 2021-07-04 MED ORDER — SORBITOL 70 % SOLN
30.0000 mL | Freq: Once | Status: AC
  Administered 2021-07-04: 30 mL via ORAL
  Filled 2021-07-04: qty 30

## 2021-07-04 NOTE — Progress Notes (Signed)
PROGRESS NOTE   Subjective/Complaints:  Pt kept saying yes to questions- not sure if correct- agrees has pain- pointed to neck-  Says constipated. 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 EXAM: CT CERVICAL SPINE WITHOUT CONTRAST TECHNIQUE: Multidetector CT imaging of the cervical spine was performed without intravenous contrast. Multiplanar CT image reconstructions were also generated. RADIATION DOSE REDUCTION: This exam was performed according to the departmental dose-optimization program which includes automated exposure control, adjustment of the mA and/or kV according to patient size and/or use of iterative reconstruction technique. COMPARISON:  06/20/2021 CTA of the neck. FINDINGS: Alignment: Straightening of the cervical spine with slight anterolisthesis at C3-4 and retrolisthesis at C5-6. Skull base and vertebrae: Lytic bone lesions affecting the clivus and essentially every visible vertebrae, although subtle disease at C1 and indeterminate sclerotic appearance at C5. Large lytic lesion in the C2 spinous process is complicated by fracture that is nondisplaced. Superior endplate fracture also seen at T2 and T3, likely chronic. Nondisplaced T2 left transverse process fracture. Epidural tumor is possible especially at the level of the C2 spinous process lesion. No gross cord compression. Scattered metastases in the bilateral cervical ribs where covered. Soft tissues and spinal canal: Masslike thickening at the right jugular chain measuring 2.3 cm, not well defined without contrast. Right-sided porta catheter in unremarkable position. Small left pleural effusion. Disc levels:  Negative Upper chest: Negative IMPRESSION: 1. Generalized osseous metastatic disease in  the visible spine, clivus, and upper ribs. 2. Nondisplaced pathologic fractures at the C2 spinous process, T2/T3 superior endplates, and T2 left transverse process. Fractures are likely nonacute. 3. Dorsal epidural tumor without cord impingement is possible at C2. 4. Adenopathy in the right neck. Electronically Signed   By: Jorje Guild M.D.   On: 07/03/2021 06:18   No results for input(s): WBC, HGB, HCT, PLT in the last 72 hours.  No results for input(s): NA, K, CL, CO2, GLUCOSE, BUN, CREATININE, CALCIUM in the last 72 hours.   Intake/Output Summary (Last 24 hours) at 07/04/2021 1410 Last data filed at 07/04/2021 0900 Gross per 24 hour  Intake 354 ml  Output 400 ml  Net -46 ml         Physical Exam: Vital Signs Blood pressure 132/70, pulse 93, temperature 97.9 F (36.6 C), resp. rate 18, SpO2 100 %.    General: awake, alert, appropriate, laying I nbed; wearing cervical collar; NAD HENT: conjugate gaze; oropharynx moist CV: regular rate; no JVD Pulmonary: CTA B/L; no W/R/R- good air movement GI: soft, NT, distended; hypoactive BS Psychiatric: appropriate Neurological: aphasic- severe- says yes only Neurologic: Cranial nerves II through XII intact, motor strength is 5/5 in L and 4/5 Right deltoid, bicep, tricep, grip, hip flexor, knee extensors, ankle dorsiflexor and plantar flexor Sensory exam cannot assess due to aphasia  Aphasia Exp> receptive, can follow simple commands point to white board when asked his name  Musculoskeletal: Pain to palp C 2 spinous process     Assessment/Plan: 1. Functional deficits  which require 3+ hours per day of interdisciplinary therapy in a comprehensive inpatient rehab setting. Physiatrist is providing close team supervision and 24 hour management of active medical problems listed below. Physiatrist and rehab team continue to assess barriers to discharge/monitor patient progress toward functional and medical goals  Care Tool:  Bathing     Body parts bathed by patient: Buttocks   Body parts bathed by helper: Buttocks, Front perineal area Body parts n/a: Right arm, Left arm, Chest, Abdomen, Right upper leg, Left upper leg, Right lower leg, Left lower leg, Front perineal area, Buttocks   Bathing assist Assist Level: Maximal Assistance - Patient 24 - 49%     Upper Body Dressing/Undressing Upper body dressing   What is the patient wearing?: Pull over shirt    Upper body assist Assist Level: Minimal Assistance - Patient > 75%    Lower Body Dressing/Undressing Lower body dressing      What is the patient wearing?: Incontinence brief, Pants     Lower body assist Assist for lower body dressing: Minimal Assistance - Patient > 75%     Toileting Toileting    Toileting assist Assist for toileting: Contact Guard/Touching assist     Transfers Chair/bed transfer  Transfers assist     Chair/bed transfer assist level: Contact Guard/Touching assist     Locomotion Ambulation   Ambulation assist      Assist level: Contact Guard/Touching assist Assistive device: No Device Max distance: 200'   Walk 10 feet activity   Assist     Assist level: Minimal Assistance - Patient > 75% Assistive device: Walker-rolling   Walk 50 feet activity   Assist    Assist level: Minimal Assistance - Patient > 75% Assistive device: Walker-rolling    Walk 150 feet activity   Assist    Assist level: Minimal Assistance - Patient > 75% Assistive device: Walker-rolling    Walk 10 feet on uneven surface  activity   Assist     Assist level: Minimal Assistance - Patient > 75% Assistive device: Walker-rolling   Wheelchair     Assist Is the patient using a wheelchair?: No             Wheelchair 50 feet with 2 turns activity    Assist            Wheelchair 150 feet activity     Assist          Blood pressure 132/70, pulse 93, temperature 97.9 F (36.6 C), resp. rate 18, SpO2 100  %.  Medical Problem List and Plan: 1. Functional deficits secondary to multiple bilateral acute infarcts largest located left frontal and occipital lobe likely secondary to embolic disease 0/81/44  versus hypercoagulability from esophageal cancer             -patient may shower             -ELOS/Goals: 2/14             Continue CIR Rib mets, left sternal met as well as left glenoid met no resistance training LUE - no pain with UE AROM , cont bedside therapy in hard collar until Neurosurgery addresses C spine stability   Con't CIR as allowed Discussed C spine films with NS Dr Glenford Peers who advised CT C spine  and CT C spine with Dr Arnoldo Morale ( on call) with plans to have Dr Marcello Moores or Glenford Peers to f/u- no signs of myelopathy , may have C2 epidural tumor without spinal cord compromise,  will involve palliative care   -Would like NS to address stability of C spine, non displace fx do not appear acute per neuroradiology report,    2.  Antithrombotics: -DVT/anticoagulation:  Pharmaceutical: Coumadin INR 3.3 no signs of bleeding , pharmacy to adjust per protocol              -antiplatelet therapy: none  3. Pain Management: Tylenol as needed. Lidocaine patch added Difficult to assess due to severe exp aphasia but on exam most consistent wit neck pain , given hx of osseous mets will check C spine xray , last xray was 2013 pre cancer, showed degenerative changes with spondylolisthesis, degenerative most pronounced at C 3-4 , has tenderness over C2 spinous process corresponding with met  4. Mood: Provide emotional support             -antipsychotic agents: N/A 5. Neuropsych: This patient is not capable of making decisions on his own behalf. 6. Skin/Wound Care: Routine skin checks 7. Hyponatremia: repeat Na tomorrow.  8.  Acute thrombus right IJ.  anticoagulation on IV heparin , until INR >2.0, 2/3 INR 1.9  9.  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  Not a candidate for intervention due to  pre-existing metastatic cancer 10.  Thrombocytopenia/acute on chronic anemia/leukocytosis.  Follow-up CBC 11.  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 Hepatic and bone mets , as discussed with brother will ask Palliative to address [place]. Follow-up Dr.Katragadda have sent message to update.  Follow-up chemotherapy as outpatient 12.  Prostate cancer with radioactive seed implant.  Follow-up outpatient.  Continue Flomax 0.4 mg daily 13.  Hypertension.  Well controlled. Monitor with increased mobility.  Home medication Norvasc 10 mg daily prior to admission.  Resume as needed.  Vitals:   07/03/21 2008 07/04/21 0423  BP: 128/65 132/70  Pulse: (!) 102 93  Resp: 18 18  Temp: 97.9 F (36.6 C) 97.9 F (36.6 C)  SpO2: 100% 100%  D/c coreg given hypotension 14.  Paroxysmal SVT.  Tachycardic. Continue Coreg 3.$RemoveBeforeDE'125mg'weMymQGlwxREaTu$  BID.  15. Prediabetes: Hgb A1c 6.3 on 06/21/21- prior CBGs well controlled, not monitoring in rehab.  16. Thrush: start nystatin 17. Reflux of food:  KUB shows no obstruction, discussed with patient 18. Decreased appetite: start megace 19. Kidney stones: IVF overnight  20. Constipation  2/11- give sorbitol for BM- last BM at least 3 days ago.   LOS: 10 days A FACE TO FACE EVALUATION WAS PERFORMED  Azekiel Cremer 07/04/2021, 2:10 PM

## 2021-07-04 NOTE — Plan of Care (Signed)
°  Problem: Consults Goal: RH STROKE PATIENT EDUCATION Description: See Patient Education module for education specifics  Outcome: Progressing   Problem: RH BOWEL ELIMINATION Goal: RH STG MANAGE BOWEL WITH ASSISTANCE Description: STG Manage Bowel with mod I Assistance. Outcome: Progressing   Problem: RH BLADDER ELIMINATION Goal: RH STG MANAGE BLADDER WITH ASSISTANCE Description: STG Manage Bladder Without Assistance Outcome: Progressing   Problem: RH SAFETY Goal: RH STG ADHERE TO SAFETY PRECAUTIONS W/ASSISTANCE/DEVICE Description: STG Adhere to Safety Precautions With cues Assistance/Device. Outcome: Progressing   Problem: RH KNOWLEDGE DEFICIT Goal: RH STG INCREASE KNOWLEDGE OF DIABETES Description: Patient and brother will be able to manage prediabetes with medications and dietary modifications using handouts and educational resources independently Outcome: Progressing Goal: RH STG INCREASE KNOWLEDGE OF HYPERTENSION Description: Patient and brother will be able to manage HTN with medications and dietary modifications using handouts and educational resources independently Outcome: Progressing Goal: RH STG INCREASE KNOWLEDGE OF DYSPHAGIA/FLUID INTAKE Description: Patient and brother will be able to manage Dysphagia, medications and dietary modifications using handouts and educational resources independently Outcome: Progressing Goal: RH STG INCREASE KNOWLEGDE OF HYPERLIPIDEMIA Description: Patient and brother will be able to manage HLD with medications and dietary modifications using handouts and educational resources independently Outcome: Progressing Goal: RH STG INCREASE KNOWLEDGE OF STROKE PROPHYLAXIS Description: Patient and brother will be able to manage secondary stroke risks with medications and dietary modifications using handouts and educational resources independently Outcome: Progressing

## 2021-07-04 NOTE — Progress Notes (Signed)
ANTICOAGULATION CONSULT NOTE - Follow Up Consult  Pharmacy Consult for Warfarin Indication: CVA, DVT  No Known Allergies  Patient Measurements: Height: 5' 8.5" (174 cm) Weight: 56.5 kg (124 lb 9 oz)  Vital Signs: Temp: 97.9 F (36.6 C) (02/11 0423) BP: 132/70 (02/11 0423) Pulse Rate: 93 (02/11 0423)  Labs: Recent Labs    07/02/21 0603 07/03/21 0507 07/04/21 0713  LABPROT 33.5* 30.5* 28.0*  INR 3.3* 2.9* 2.6*    Estimated Creatinine Clearance: 67 mL/min (by C-G formula based on SCr of 0.89 mg/dL).   Medications:  Scheduled:   heparin lock flush  500 Units Intracatheter Q30 days   lidocaine  1 patch Transdermal Q24H   megestrol  400 mg Oral Daily   nystatin  5 mL Oral QID   senna-docusate  2 tablet Oral QHS   sodium chloride flush  10-40 mL Intracatheter Q12H   sorbitol  30 mL Oral Once   tamsulosin  0.4 mg Oral QHS   Warfarin - Pharmacist Dosing Inpatient   Does not apply q1600    Assessment: 66 yo M with new RIJ DVT 06/20/21 in the setting of R intracarotid artery PAD, liver cirrhosis, stroke, and esophageal cancer. No anticoagulation prior to admission. INR on admission was 1.3. Pharmacy consulted for warfarin and heparin.  Anticoagulation was held 1/31 with new onset of hematuria. Per MD, ok to resume anticoagulation on 2/5. Heparin was discontinued on 2/7, patient continued on warfarin. Pharmacy to dose warfarin.  INR down to 2.6 today (INR goal 2-3).  CBC stable.  No s/sx of bleeding noted.   Goal of Therapy:  INR 2-3 Monitor platelets by anticoagulation protocol: Yes   Plan:  Give Warfarin 1 mg PO tonight.  Daily INR, CBC.  Monitor for s/sx of bleeding.    Vance Peper, PharmD PGY1 Pharmacy Resident Phone [phone] 07/04/2021 12:00 PM   Please check AMION for all [place] phone numbers After 10:00 PM, call [place] [phone]

## 2021-07-04 NOTE — Consult Note (Signed)
Palliative Medicine Inpatient Consult Note  Reason for consult:  Goals of care  HPI:  Per intake H&P 06/24/2021 by Dr. Verlene Mayer is a 66 year old right-handed male with history of paroxysmal SVT, thrombocytopenia, hyperlipidemia, prostate cancer with radioactive seed implant 2016, history of static squamous cell carcinoma of esophagus currently undergoing chemotherapy, alcohol abuse currently in remission, hypertension, quit smoking 7 months ago.  Per chart review patient lives alone he has a brother with good support.  Presented 06/20/2021 with acute onset of aphasia.  Cranial CT scan negative for acute changes.  CT angiogram head and neck no emergent large vessel occlusion in the head.  High-grade approximately 80% in the proximal right ICA, severe stenosis proximal left V1 segment multifocal moderate to severe stenosis of the proximal left V2 segment.  Mixed plaque in the proximal left ICA resulting in approximate 30 to 40% stenosis.  Suspect thrombus in the right internal jugular vein above the level of the central venous catheter.  Extensive osseous metastatic disease.  MRI of the brain multiple bilateral acute infarcts affecting multiple vascular territories the largest which located the left frontal operculum and left occipital lobe.  TCD bubble study did not indicate PFO.  Renal ultrasound unremarkable urinary tract without hydronephrosis.  Previously demonstrated multiple liver metastasis.  Small amount of ascites.  Echocardiogram with ejection fraction of 55 to 60% no wall motion abnormalities grade 1 diastolic dysfunction.  Admission chemistries unremarkable except sodium 134, WBC 24,000, platelets 96,000, hemoglobin 8.8, ammonia level 24, urine drug screen positive opiates..  Vascular surgery Dr. Servando Snare consulted in regards to high-grade stenosis right ICA patient asymptomatic not a good candidate for surgical intervention given underlying metastatic malignancy.  Initially  placed on IV heparin and transitioning to Coumadin however patient developed hematuria and currently maintained on aspirin therapy and it was later decided that heparin could be resumed transitioning to Coumadin.  Hemoglobin remained stable 9.1 monitoring of leukocytosis improved from 24,000 and 18,100 and latest platelet count improved to 119,000.  Currently on a dysphagia #2 thin liquid diet.  Therapy evaluations completed due to patient decreased functional mobility and aphasia was admitted for a comprehensive rehab program. Expressive aphasia appears worse than receptive.     Clinical Assessment/Goals of Care: I have reviewed medical records including EPIC notes, labs and imaging, assessed the patient.    I met with Mr. Golebiewski and with his brother Vernard Gambles, and her daughter to further discuss diagnosis prognosis, GOC, EOL wishes, disposition and options.   I introduced Palliative Medicine as specialized medical care for people living with serious illness. It focuses on providing relief from the symptoms and stress of a serious illness. The goal is to improve quality of life for both the patient and the family.  Mr. Seppala has been disabled for 10 years. 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 The family is [facility] and has faith in [place] plan.  We reviewed what the family knew about his condition. They expressed frustration that the neck lesions had not been found in previous assessments. A detailed discussion was had today regarding advanced directives.  Concepts specific to code status, artifical feeding and hydration, continued IV antibiotics and rehospitalization was had.  The difference between a aggressive medical intervention path  and a palliative comfort care path for  this patient at this  time was had. Values and goals of care important to patient and family were attempted to be elicited. The family wishes for aggressive medical therapies to be continued. Their plan is home at discharge. They are agreeable to home health with therapies and community based palliative care. [name] will stay with him and the family will rotate in to help with his care. They state they need at home a shower chair, bedside commode, and walker if PT agrees. I attempted to answer all questions the family had.   Symptoms to manage pain in hands, nausea/vomiting and lack of appetite.   Discussed the importance of continued conversation with family and their  medical providers regarding overall plan of care and treatment options, ensuring decisions are within the context of the patients values and GOCs.  Decision Maker: Brother [name] but there is no MPOA documented.  SUMMARY OF RECOMMENDATIONS    Code Status/Advance Care Planning: FULL CODE   Symptom Management:  Pain: Acetaminophen prn Lack of appetite: Megestrol Oral thrush: nystatin Constipation: senna-docusate Deconditioning: continue OT and PT Expressive aphasia: SLP  Additional Recommendations (Limitations, Scope, Preferences): Full scope of treatment Family request patient not be told about metastasis  Psycho-social/Spiritual:  Desire for further Chaplaincy support: No, brother Letitia Libra is pastor   Prognosis: 66 year old with metastatic disease.   Discharge Planning: Home with family, home health with therapies and community based palliative care. The family may benefit from continued goals of care conversations.   ROS- unable to obtain due to expressive aphasia  Vitals with BMI 07/04/2021 07/03/2021 07/03/2021  Height - - -  Weight - - -  BMI - - -  Systolic 510 258 527  Diastolic 70 65 70  Pulse 93 102 104    Physical Exam Constitutional:      Appearance: Normal appearance.     Comments: cachectic  HENT:     Head: Normocephalic  and atraumatic.  Cardiovascular:     Rate and Rhythm: Tachycardia present.  Pulmonary:     Effort: Pulmonary effort is normal. No respiratory distress.     Breath sounds: No wheezing.  Skin:    General: Skin is warm and dry.  Neurological:     Mental Status: He is alert.     Comments: Expressive aphasia, appears to comprehend spoken word, answers yes and no appropriately. Good eye contact.    PPS:   This conversation/these recommendations were discussed with patient primary care team, Dr. Danae Chen via secure chat.  Thank you for the opportunity to participate in the care of this patient and family.    Total Time: 85 minutes Greater than 50%  of this time was spent counseling and coordinating care related to the above assessment and plan.  Lindell Spar, NP [facility] Palliative Medicine Team Team Cell Phone: [phone] Please utilize secure chat with additional questions, if there is no response within 30 minutes please call the above phone number  Palliative Medicine Team providers are available by phone from 7am to 7pm daily and can be reached through the team cell phone.  Should this patient require assistance outside of these hours, please call the patient's attending physician.

## 2021-07-05 DIAGNOSIS — Z515 Encounter for palliative care: Secondary | ICD-10-CM

## 2021-07-05 DIAGNOSIS — I6310 Cerebral infarction due to embolism of unspecified precerebral artery: Secondary | ICD-10-CM

## 2021-07-05 LAB — CBC
HCT: 26.4 % — ABNORMAL LOW (ref 39.0–52.0)
Hemoglobin: 8.6 g/dL — ABNORMAL LOW (ref 13.0–17.0)
MCH: 27.3 pg (ref 26.0–34.0)
MCHC: 32.6 g/dL (ref 30.0–36.0)
MCV: 83.8 fL (ref 80.0–100.0)
Platelets: 323 10*3/uL (ref 150–400)
RBC: 3.15 MIL/uL — ABNORMAL LOW (ref 4.22–5.81)
RDW: 21.2 % — ABNORMAL HIGH (ref 11.5–15.5)
WBC: 14.5 10*3/uL — ABNORMAL HIGH (ref 4.0–10.5)
nRBC: 0 % (ref 0.0–0.2)

## 2021-07-05 LAB — PROTIME-INR
INR: 2.3 — ABNORMAL HIGH (ref 0.8–1.2)
Prothrombin Time: 25.1 seconds — ABNORMAL HIGH (ref 11.4–15.2)

## 2021-07-05 MED ORDER — WARFARIN SODIUM 2 MG PO TABS
2.0000 mg | ORAL_TABLET | Freq: Once | ORAL | Status: AC
  Administered 2021-07-05: 2 mg via ORAL
  Filled 2021-07-05: qty 1

## 2021-07-05 MED ORDER — POLYETHYLENE GLYCOL 3350 17 G PO PACK
17.0000 g | PACK | Freq: Every day | ORAL | Status: DC
  Administered 2021-07-05 – 2021-07-07 (×3): 17 g via ORAL
  Filled 2021-07-05 (×3): qty 1

## 2021-07-05 NOTE — Progress Notes (Signed)
Pt d/c'd IV.  Unsure if it was intentional or not pt unable to say.

## 2021-07-05 NOTE — Progress Notes (Signed)
ANTICOAGULATION CONSULT NOTE - Follow Up Consult  Pharmacy Consult for Warfarin Indication: CVA, DVT  No Known Allergies  Patient Measurements: Height: 5' 8.5" (174 cm) Weight: 56.5 kg (124 lb 9 oz)  Vital Signs: Temp: 97.6 F (36.4 C) (02/12 0438) BP: 111/67 (02/12 0438) Pulse Rate: 96 (02/12 0438)  Labs: Recent Labs    07/03/21 0507 07/04/21 0713 07/05/21 0642  LABPROT 30.5* 28.0* 25.1*  INR 2.9* 2.6* 2.3*     CrCl cannot be calculated (Unknown ideal weight.).   Medications:  Scheduled:   heparin lock flush  500 Units Intracatheter Q30 days   lidocaine  1 patch Transdermal Q24H   megestrol  400 mg Oral Daily   nystatin  5 mL Oral QID   senna-docusate  2 tablet Oral QHS   sodium chloride flush  10-40 mL Intracatheter Q12H   tamsulosin  0.4 mg Oral QHS   Warfarin - Pharmacist Dosing Inpatient   Does not apply q1600    Assessment: 66 yo M with new RIJ DVT 06/20/21 in the setting of R intracarotid artery PAD, liver cirrhosis, stroke, and esophageal cancer. No anticoagulation prior to admission. INR on admission was 1.3. Pharmacy consulted for warfarin and heparin.  Anticoagulation was held 1/31 with new onset of hematuria. Per MD, ok to resume anticoagulation on 2/5. Heparin was discontinued on 2/7, patient continued on warfarin. Pharmacy to dose warfarin.  INR down to 2.3 today (INR goal 2-3).  CBC stable.  No s/sx of bleeding noted.   Goal of Therapy:  INR 2-3 Monitor platelets by anticoagulation protocol: Yes   Plan:  Give Warfarin 2 mg PO tonight.  Daily INR, CBC.  Monitor for s/sx of bleeding.    Vance Peper, PharmD PGY1 Pharmacy Resident Phone [phone] 07/05/2021 7:52 AM   Please check AMION for all [place] phone numbers After 10:00 PM, call [place] [phone]

## 2021-07-05 NOTE — Progress Notes (Signed)
PROGRESS NOTE   Subjective/Complaints:  Pt kept saying no today- no pain- no issues- but not sure if aphasia or correct?  ROS-cannot assess due to aphasia  Objective:   No results found. Recent Labs    07/05/21 0642  WBC 14.5*  HGB 8.6*  HCT 26.4*  PLT 323    No results for input(s): NA, K, CL, CO2, GLUCOSE, BUN, CREATININE, CALCIUM in the last 72 hours.   Intake/Output Summary (Last 24 hours) at 07/05/2021 1723 Last data filed at 07/05/2021 0906 Gross per 24 hour  Intake 118 ml  Output --  Net 118 ml         Physical Exam: Vital Signs Blood pressure 117/61, pulse 98, temperature 98.3 F (36.8 C), temperature source Oral, resp. rate 15, SpO2 100 %.     General: awake, alert, sleepy; curled up in bed; NAD HENT: conjugate gaze; oropharynx moist CV: regular rate; no JVD Pulmonary: CTA B/L; no W/R/R- good air movement GI: soft, NT, ND, (+)BS Psychiatric: appropriate; but very flat Neurological: aphasic- severe- said no only Neurologic: Cranial nerves II through XII intact, motor strength is 5/5 in L and 4/5 Right deltoid, bicep, tricep, grip, hip flexor, knee extensors, ankle dorsiflexor and plantar flexor Sensory exam cannot assess due to aphasia  Aphasia Exp> receptive, can follow simple commands point to white board when asked his name  Musculoskeletal: Pain to palp C 2 spinous process     Assessment/Plan: 1. Functional deficits which require 3+ hours per day of interdisciplinary therapy in a comprehensive inpatient rehab setting. Physiatrist is providing close team supervision and 24 hour management of active medical problems listed below. Physiatrist and rehab team continue to assess barriers to discharge/monitor patient progress toward functional and medical goals  Care Tool:  Bathing    Body parts bathed by patient: Buttocks   Body parts bathed by helper: Buttocks, Front perineal area Body  parts n/a: Right arm, Left arm, Chest, Abdomen, Right upper leg, Left upper leg, Right lower leg, Left lower leg, Front perineal area, Buttocks   Bathing assist Assist Level: Maximal Assistance - Patient 24 - 49%     Upper Body Dressing/Undressing Upper body dressing   What is the patient wearing?: Pull over shirt    Upper body assist Assist Level: Minimal Assistance - Patient > 75%    Lower Body Dressing/Undressing Lower body dressing      What is the patient wearing?: Incontinence brief, Pants     Lower body assist Assist for lower body dressing: Minimal Assistance - Patient > 75%     Toileting Toileting    Toileting assist Assist for toileting: Contact Guard/Touching assist     Transfers Chair/bed transfer  Transfers assist     Chair/bed transfer assist level: Contact Guard/Touching assist     Locomotion Ambulation   Ambulation assist      Assist level: Contact Guard/Touching assist Assistive device: No Device Max distance: 200'   Walk 10 feet activity   Assist     Assist level: Minimal Assistance - Patient > 75% Assistive device: Walker-rolling   Walk 50 feet activity   Assist    Assist level: Minimal Assistance - Patient >  75% Assistive device: Walker-rolling    Walk 150 feet activity   Assist    Assist level: Minimal Assistance - Patient > 75% Assistive device: Walker-rolling    Walk 10 feet on uneven surface  activity   Assist     Assist level: Minimal Assistance - Patient > 75% Assistive device: Walker-rolling   Wheelchair     Assist Is the patient using a wheelchair?: No             Wheelchair 50 feet with 2 turns activity    Assist            Wheelchair 150 feet activity     Assist          Blood pressure 117/61, pulse 98, temperature 98.3 F (36.8 C), temperature source Oral, resp. rate 15, SpO2 100 %.  Medical Problem List and Plan: 1. 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/18/47  versus hypercoagulability from esophageal cancer             -patient may shower             -ELOS/Goals: 2/14             Continue CIR Rib mets, left sternal met as well as left glenoid met no resistance training LUE - no pain with UE AROM , cont bedside therapy in hard collar until Neurosurgery addresses C spine stability    Discussed C spine films with NS Dr Glenford Peers who advised CT C spine  and CT C spine with Dr Arnoldo Morale ( on call) with plans to have Dr Marcello Moores or Glenford Peers to f/u- no signs of myelopathy , may have C2 epidural tumor without spinal cord compromise, will involve palliative care   -Would like NS to address stability of C spine, non displace fx do not appear acute per neuroradiology report,     Con't CIR as tolerated 2.  Antithrombotics: -DVT/anticoagulation:  Pharmaceutical: Coumadin INR 3.3 no signs of bleeding , pharmacy to adjust per protocol   2/12- INR 2.3- con't regimen per pharmacy             -antiplatelet therapy: none  3. Pain Management: Tylenol as needed. Lidocaine patch added Difficult to assess due to severe exp aphasia but on exam most consistent wit neck pain , given hx of osseous mets will check C spine xray , last xray was 2013 pre cancer, showed degenerative changes with spondylolisthesis, degenerative most pronounced at C 3-4 , has tenderness over C2 spinous process corresponding with met   2/12- denies pain today when asked 2 different ways?- con't regimen 4. Mood: Provide emotional support             -antipsychotic agents: N/A 5. Neuropsych: This patient is not capable of making decisions on his own behalf. 6. Skin/Wound Care: Routine skin checks 7. Hyponatremia: repeat Na tomorrow.  8.  Acute thrombus right IJ.  anticoagulation on IV heparin , until INR >2.0, 2/3 INR 1.9 2/12- INR 2.3- off heparin  9.  High-grade stenosis proximal right  internal carotid artery.  Not a candidate for intervention due to pre-existing metastatic cancer 10.  Thrombocytopenia/acute on chronic anemia/leukocytosis.  Follow-up CBC 11.  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 Hepatic and bone mets , as discussed with brother will ask Palliative to address [place]. Follow-up Dr.Katragadda have sent message to update.  Follow-up chemotherapy as  outpatient 12.  Prostate cancer with radioactive seed implant.  Follow-up outpatient.  Continue Flomax 0.4 mg daily 13.  Hypertension.  Well controlled. Monitor with increased mobility.  Home medication Norvasc 10 mg daily prior to admission.  Resume as needed.  Vitals:   07/05/21 0438 07/05/21 1333  BP: 111/67 117/61  Pulse: 96 98  Resp: 20 15  Temp: 97.6 F (36.4 C) 98.3 F (36.8 C)  SpO2: 100% 100%   2/12- BP better controlled- con't regimen 14.  Paroxysmal SVT.  Tachycardic. Continue Coreg 3.139m BID.  15. Prediabetes: Hgb A1c 6.3 on 06/21/21- prior CBGs well controlled, not monitoring in rehab.  16. Thrush: start nystatin 17. Reflux of food:  KUB shows no obstruction, discussed with patient 18. Decreased appetite: start megace 19. Kidney stones: IVF overnight  20. Constipation  2/11- give sorbitol for BM- last BM at least 3 days ago.   2/12- LBM this AM- not incontinent per chart  LOS: 11 days A FACE TO FACE EVALUATION WAS PERFORMED  Angelita Harnack 07/05/2021, 5:23 PM

## 2021-07-05 NOTE — Discharge Summary (Signed)
Physician Discharge Summary  Patient ID: IVO MOGA MRN: [id number] DOB/AGE: [birth year] 64 y.o.  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 Possibly a fracture is not excluded. Attempt was made to reach Dr. Olevia Bowens. Imaging finding of new pathological fractures were relayed to patient's nurse Cheri Guppy by telephone call at 10:53 a.m. on 07/02/2021. Electronically Signed   By: Elmer Picker M.D.   On: 07/02/2021 10:57   Result Date: 07/02/2021 CLINICAL DATA:  Neck pain EXAM: CERVICAL SPINE - 2-3 VIEW COMPARISON:  04/27/2012 FINDINGS: There is 1.5 cm radiolucency in the spinous process of C2 vertebra which was not seen in the previous examination. There is break in the cortical margin in the inferior margin of the spinous process of C2 vertebra. There is faint low attenuation in the anterior aspect of body of C3 vertebra. There is questionable linear radiolucency in the inferior aspect of body of C3 vertebra. Degenerative changes are noted with bony spurs and facet hypertrophy from C3-C7 levels with interval worsening. There is minimal anterolisthesis at C3-C4 level which has not changed. Prevertebral soft tissues are unremarkable. There is interval placement of right IJ chest port.  IMPRESSION: There is interval appearance of 1.5 cm lytic lesion in the spinous process of C2 vertebra with pathological fracture. There is faint low-density in the anterior aspect of body of C3 vertebra with possible linear radiolucent fracture line in the lower endplate. Possibility of neoplastic process such as metastatic disease or multiple myeloma with pathological fractures should be considered. Cervical spondylosis with bony spurs and disc space narrowing from C3-C7 levels with interval worsening. Electronically Signed: By: Elmer Picker M.D. On: 07/02/2021 10:43   CT HEAD WO CONTRAST  Result Date: 06/20/2021 CLINICAL DATA:  Altered mental status this morning. EXAM: CT HEAD WITHOUT CONTRAST TECHNIQUE: Contiguous axial images were obtained from the base of the skull through the vertex without intravenous contrast. RADIATION DOSE REDUCTION: This exam was performed according to the departmental dose-optimization program which includes automated exposure control, adjustment of the mA and/or kV according to patient size and/or use of iterative reconstruction technique. COMPARISON:  03/08/2012 FINDINGS: Brain: No evidence of acute infarction, hemorrhage, hydrocephalus, extra-axial collection or mass lesion/mass effect. Mild ventricular sulcal enlargement reflecting mild volume loss, stable from the prior head CT. Mild areas of white matter hypoattenuation are also noted, also stable, consistent with chronic microvascular ischemic change. Vascular: No hyperdense vessel or unexpected calcification. Skull: Normal. Negative for fracture or focal lesion. Sinuses/Orbits: Visualized globes and orbits are unremarkable. Visualized sinuses are clear. Other: None. IMPRESSION: 1. No acute intracranial abnormalities. No significant change from prior study. Electronically Signed   By: Lajean Manes M.D.   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 EXAM: CT CERVICAL SPINE WITHOUT CONTRAST TECHNIQUE: Multidetector CT imaging of the cervical spine was performed without  intravenous contrast. Multiplanar CT image reconstructions were also generated. RADIATION DOSE REDUCTION: This exam was performed according to the departmental dose-optimization program which includes automated exposure control, adjustment of the mA and/or kV according to patient size and/or use of iterative reconstruction technique. COMPARISON:  06/20/2021 CTA of the neck. FINDINGS: Alignment: Straightening of the cervical spine with slight anterolisthesis at C3-4 and retrolisthesis at C5-6. Skull base and vertebrae: Lytic bone lesions affecting the clivus and essentially every visible vertebrae, although subtle disease at C1 and indeterminate sclerotic appearance at C5. Large lytic lesion in the C2 spinous process is complicated by fracture that is nondisplaced. Superior endplate fracture also seen at T2 and T3, likely chronic. Nondisplaced T2 left transverse process fracture. Epidural tumor is possible especially at the level of the C2 spinous process lesion. No gross cord compression. Scattered metastases in the bilateral cervical ribs where covered. Soft tissues and spinal canal: Masslike thickening at the right jugular chain measuring 2.3 cm, not well defined without contrast. Right-sided porta catheter in unremarkable position. Small left pleural effusion. Disc levels:  Negative Upper chest: Negative IMPRESSION: 1. Generalized osseous metastatic disease in the visible spine, clivus, and upper ribs. 2. Nondisplaced pathologic fractures at the C2 spinous process, T2/T3 superior endplates, and T2 left transverse process. Fractures are likely nonacute. 3. Dorsal epidural tumor without cord impingement is possible at C2. 4. Adenopathy in the right neck. Electronically Signed   By: Tiburcio Pea M.D.   On: 07/03/2021 06:18   MR BRAIN WO  CONTRAST  Result Date: 06/21/2021 CLINICAL DATA:  Acute neurologic deficit EXAM: MRI HEAD WITHOUT CONTRAST TECHNIQUE: Multiplanar, multiecho pulse sequences of the brain and surrounding structures were obtained without intravenous contrast. COMPARISON:  05/04/2021 FINDINGS: Brain: Multiple bilateral acute infarcts, the largest of which is located in the left frontal operculum. The next largest area is in the left occipital lobe. The other areas are punctate. Multiple vascular territories are affected. No acute or chronic hemorrhage. There is multifocal hyperintense T2-weighted signal within the white matter. Generalized volume loss without a clear lobar predilection. The midline structures are normal. Vascular: Major flow voids are preserved. Skull and upper cervical spine: Normal calvarium and skull base. Visualized upper cervical spine and soft tissues are normal. Sinuses/Orbits:No paranasal sinus fluid levels or advanced mucosal thickening. No mastoid or middle ear effusion. Normal orbits. IMPRESSION: Multiple bilateral acute infarcts affecting multiple vascular territories, the largest of which are located in the left frontal operculum and left occipital lobe. 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 Electronically Signed   By: Deatra Robinson M.D.   On: 06/21/2021 01:27   US RENAL  Result Date: 06/24/2021 CLINICAL DATA:  Hematuria.  Prostate and esophageal carcinoma. EXAM: RENAL / URINARY TRACT ULTRASOUND COMPLETE COMPARISON:  Ultrasound-guided liver biopsy dated 02/06/2021. Chest, abdomen and pelvis CT dated 05/13/2021. FINDINGS: Right Kidney: Renal measurements: 9.7 x 6.3 x 5.3 cm. = volume: 168 mL. 1.5 cm cyst. Normal echotexture. No hydronephrosis. Left Kidney: Renal measurements: 10.0 x 5.5 x 4.8 cm = volume: 136 mL. 0.7 and 0.6 cm cysts. Normal echotexture. No hydronephrosis. Bladder: Appears normal for degree of bladder distention. Other: Previously demonstrated prostate radiation seed  implants. Multiple liver masses demonstrated on the CT dated 05/13/2021. Small amount of free peritoneal fluid in all 4 quadrants of the abdomen. IMPRESSION: 1. Unremarkable urinary tract without hydronephrosis or visible calculi. 2. Previously demonstrated multiple liver metastases. 3. Small amount of ascites. Electronically Signed   By: Zada Finders.D.  On: 06/24/2021 13:25   DG Chest Port 1 View  Result Date: 06/20/2021 CLINICAL DATA:  Esophageal cancer EXAM: PORTABLE CHEST 1 VIEW COMPARISON:  July thirteenth 2022 FINDINGS: The cardiomediastinal silhouette is unchanged in contour.RIGHT chest port with tip terminating over the inferior RIGHT atrium. No pleural effusion. No pneumothorax. No acute pleuroparenchymal abnormality. Visualized abdomen is unremarkable. Osseous metastatic disease with multiple lytic lesions of bilateral ribs, LEFT scapula and likely RIGHT proximal humeral. Likely pathologic rib fracture noted in the LEFT posterior sixth rib, RIGHT first rib and RIGHT seventh rib. IMPRESSION: No acute cardiopulmonary abnormality. Revisualization of osseous metastatic disease. Electronically Signed   By: Valentino Saxon M.D.   On: 06/20/2021 17:25   DG Abd Portable 1V  Result Date: 06/30/2021 CLINICAL DATA:  Abdominal pain EXAM: PORTABLE ABDOMEN - 1 VIEW COMPARISON:  None. FINDINGS: Nonobstructive bowel gas pattern. There is a small calcification measuring 4 mm overlying the left renal shadow and tiny calcifications overlying the right renal shadow. Vascular calcifications. Prostate brachytherapy seeds. There are multiple lytic lesions seen within the spine, ribs, pelvis, and proximal femurs consistent with known diffuse lytic osseous metastatic disease. Subacute pathologic fractures of multiple lower ribs. IMPRESSION: No evidence of bowel obstruction. Diffuse lytic osseous metastatic disease with multiple subacute pathologic lower rib fractures bilaterally. Small calcifications overlying the  kidneys bilaterally, consistent with small renal stones. Electronically Signed   By: Maurine Simmering M.D.   On: 06/30/2021 15:21   VAS [place] TRANSCRANIAL DOPPLER W BUBBLES  Result Date: 06/25/2021  Transcranial Doppler with Bubble Patient Name:  ABDO DENAULT  Date of Exam:   06/22/2021 Medical Rec #: [id number]          Accession #:    [id number] Date of Birth: 04/27/1956           Patient Gender: M Patient Age:   45 years Exam Location:  [facility] Procedure:      VAS [place] TRANSCRANIAL DOPPLER W BUBBLES Referring Phys: PRAMOD SETHI --------------------------------------------------------------------------------  Indications: Stroke. Limitations: Constant patient movement, patient unable to fully cooperate with              exam/valsalva. Comparison Study: No prior studies. Performing Technologist: Darlin Coco RDMS, RVT  Examination Guidelines: A complete evaluation includes B-mode imaging, spectral Doppler, color Doppler, and power Doppler as needed of all accessible portions of each vessel. Bilateral testing is considered an integral part of a complete examination. Limited examinations for reoccurring indications may be performed as noted.  Summary: No HITS at rest or during Valsalva. Negative transcranial Doppler Bubble study with no evidence of right to left intracardiac communication.  A vascular evaluation was performed. The right middle cerebral artery was studied. An IV was inserted into the patient's left antecubital. Verbal informed consent was obtained.  NEGATIVE TCD BUBBLE STUDY *See table(s) above for TCD measurements and observations.  Diagnosing physician: Antony Contras MD Electronically signed by Antony Contras MD on 06/25/2021 at 1:44:58 PM.    Final    ECHOCARDIOGRAM COMPLETE  Result Date: 06/21/2021    ECHOCARDIOGRAM REPORT   Patient Name:   JAHMIR SALO Date of Exam: 06/21/2021 Medical Rec #:  [id number]         Height:       68.5 in Accession #:    [id number]        Weight:       124.6 lb  Date of Birth:  18-Nov-1955          BSA:  1.680 m Patient Age:    68 years          BP:           131/74 mmHg Patient Gender: M                 HR:           86 bpm. Exam Location:  Inpatient Procedure: 2D Echo, Cardiac Doppler and Color Doppler Indications:    Stroke I63.9  History:        Patient has no prior history of Echocardiogram examinations.                 Risk Factors:Hypertension, Dyslipidemia and ETOH.  Sonographer:    Bernadene Person RDCS Referring Phys: [place]  1. Left ventricular ejection fraction, by estimation, is 55 to 60%. The left ventricle has normal function. The left ventricle has no regional wall motion abnormalities. Left ventricular diastolic parameters are consistent with Grade I diastolic dysfunction (impaired relaxation).  2. Right ventricular systolic function is normal. The right ventricular size is normal. There is normal pulmonary artery systolic pressure.  3. The mitral valve is normal in structure. Trivial mitral valve regurgitation. No evidence of mitral stenosis.  4. The aortic valve is tricuspid. There is mild calcification of the aortic valve. There is moderate thickening of the aortic valve. Aortic valve regurgitation is mild to moderate. Aortic valve sclerosis/calcification is present, without any evidence of  aortic stenosis.  5. The inferior vena cava is normal in size with greater than 50% respiratory variability, suggesting right atrial pressure of 3 mmHg. FINDINGS  Left Ventricle: Left ventricular ejection fraction, by estimation, is 55 to 60%. The left ventricle has normal function. The left ventricle has no regional wall motion abnormalities. The left ventricular internal cavity size was normal in size. There is  no left ventricular hypertrophy. Left ventricular diastolic parameters are consistent with Grade I diastolic dysfunction (impaired relaxation). Normal left ventricular filling pressure. Right Ventricle: The right ventricular  size is normal. No increase in right ventricular wall thickness. Right ventricular systolic function is normal. There is normal pulmonary artery systolic pressure. The tricuspid regurgitant velocity is 2.42 m/s, and  with an assumed right atrial pressure of 3 mmHg, the estimated right ventricular systolic pressure is 18.8 mmHg. Left Atrium: Left atrial size was normal in size. Right Atrium: Right atrial size was normal in size. Pericardium: There is no evidence of pericardial effusion. Mitral Valve: The mitral valve is normal in structure. Mild mitral annular calcification. Trivial mitral valve regurgitation. No evidence of mitral valve stenosis. Tricuspid Valve: The tricuspid valve is normal in structure. Tricuspid valve regurgitation is mild . No evidence of tricuspid stenosis. Aortic Valve: The aortic valve is tricuspid. There is mild calcification of the aortic valve. There is moderate thickening of the aortic valve. Aortic valve regurgitation is mild to moderate. Aortic regurgitation PHT measures 378 msec. Aortic valve sclerosis/calcification is present, without any evidence of aortic stenosis. Aortic valve mean gradient measures 8.0 mmHg. Aortic valve peak gradient measures 15.5 mmHg. Aortic valve area, by VTI measures 1.87 cm. Pulmonic Valve: The pulmonic valve was normal in structure. Pulmonic valve regurgitation is not visualized. No evidence of pulmonic stenosis. Aorta: The aortic root is normal in size and structure. Venous: The inferior vena cava is normal in size with greater than 50% respiratory variability, suggesting right atrial pressure of 3 mmHg. IAS/Shunts: No atrial level shunt detected by color flow Doppler. Additional Comments:  A device lead is visualized in the right ventricle.  LEFT VENTRICLE PLAX 2D LVIDd:         5.10 cm      Diastology LVIDs:         3.70 cm      LV e' lateral:   10.30 cm/s LV PW:         0.70 cm      LV E/e' lateral: 8.3 LV IVS:        0.90 cm LVOT diam:     2.00 cm LV  SV:         62 LV SV Index:   37 LVOT Area:     3.14 cm  LV Volumes (MOD) LV vol d, MOD A2C: 123.0 ml LV vol d, MOD A4C: 116.0 ml LV vol s, MOD A2C: 61.5 ml LV vol s, MOD A4C: 57.1 ml LV SV MOD A2C:     61.5 ml LV SV MOD A4C:     116.0 ml LV SV MOD BP:      58.6 ml RIGHT VENTRICLE RV S prime:     13.60 cm/s TAPSE (M-mode): 1.7 cm LEFT ATRIUM             Index        RIGHT ATRIUM           Index LA diam:        2.90 cm 1.73 cm/m   RA Area:     12.80 cm LA Vol (A2C):   32.3 ml 19.22 ml/m  RA Volume:   28.20 ml  16.78 ml/m LA Vol (A4C):   26.0 ml 15.47 ml/m LA Biplane Vol: 29.1 ml 17.32 ml/m  AORTIC VALVE AV Area (Vmax):    1.90 cm AV Area (Vmean):   1.83 cm AV Area (VTI):     1.87 cm AV Vmax:           197.00 cm/s AV Vmean:          132.000 cm/s AV VTI:            0.329 m AV Peak Grad:      15.5 mmHg AV Mean Grad:      8.0 mmHg LVOT Vmax:         119.00 cm/s LVOT Vmean:        77.100 cm/s LVOT VTI:          0.196 m LVOT/AV VTI ratio: 0.60 AI PHT:            378 msec AR Vena Contracta: 0.40 cm  AORTA Ao Root diam: 3.10 cm Ao Asc diam:  3.30 cm MITRAL VALVE                TRICUSPID VALVE MV Area (PHT): 4.29 cm     TR Peak grad:   23.4 mmHg MV Decel Time: 177 msec     TR Vmax:        242.00 cm/s MV E velocity: 85.10 cm/s MV A velocity: 136.00 cm/s  SHUNTS MV E/A ratio:  0.63         Systemic VTI:  0.20 m                             Systemic Diam: 2.00 cm Dani Gobble Croitoru MD Electronically signed by Sanda Klein MD Signature Date/Time: 06/21/2021/11:36:28 AM    Final    CT ANGIO HEAD NECK W WO CM W PERF (CODE  STROKE)  Result Date: 06/20/2021 CLINICAL DATA:  Altered mental status EXAM: CT ANGIOGRAPHY HEAD AND NECK CT PERFUSION BRAIN TECHNIQUE: Multidetector CT imaging of the head and neck was performed using the standard protocol during bolus administration of intravenous contrast. Multiplanar CT image reconstructions and MIPs were obtained to evaluate the vascular anatomy. Carotid stenosis measurements (when  applicable) are obtained utilizing NASCET criteria, using the distal internal carotid diameter as the denominator. Multiphase CT imaging of the brain was performed following IV bolus contrast injection. Subsequent parametric perfusion maps were calculated using RAPID software. RADIATION DOSE REDUCTION: This exam was performed according to the departmental dose-optimization program which includes automated exposure control, adjustment of the mA and/or kV according to patient size and/or use of iterative reconstruction technique. CONTRAST:  143mL OMNIPAQUE IOHEXOL 350 MG/ML SOLN COMPARISON:  Same-day noncontrast head CT, brain MRI 05/04/2021 FINDINGS: CTA NECK FINDINGS Aortic arch: There is mild calcified atherosclerotic plaque of the aortic arch. The origins of the major branch vessels are patent. The subclavian arteries are patent. Right carotid system: The right common carotid artery is patent. The right external carotid artery is patent. There is mixed plaque in the proximal right internal carotid artery resulting in focal high-grade stenosis, approximally 80% (9-63, 6-184). The internal carotid artery distal to this point is widely patent. Left carotid system: The left common carotid artery is patent. Left external carotid artery is patent. There is mixed plaque in the proximal left internal carotid artery resulting in approximally 30-40% stenosis. The distal left internal carotid artery is patent. Vertebral arteries: There is soft plaque in the proximal right V1 segment resulting in mild stenosis. Remainder of the right vertebral artery is patent. There is multifocal mixed plaque in the proximal left vertebral artery resulting in severe stenosis in the V1 segment (6-265), and multifocal moderate to severe stenosis of the proximal V2 segment (6-235). The distal left V2 segment and V3 segment are patent. Skeleton: There are extensive lytic and sclerotic lesions throughout the imaged cervical spine, clavicles, and  sternum consistent with metastatic disease. Other neck: The soft tissues are unremarkable. Upper chest: The imaged lung apices are clear. Review of the MIP images confirms the above findings CTA HEAD FINDINGS Anterior circulation: The intracranial ICAs are patent. The bilateral MCAs are patent. The bilateral ACAs are patent. The anterior communicating artery is normal. There is no aneurysm or AVM. Posterior circulation: The bilateral V4 segments are patent. PICA is identified bilaterally. The basilar artery is patent. The bilateral PCAs are patent. The posterior communicating arteries are not identified. There is no aneurysm or AVM. Venous sinuses: The dural venous sinuses are patent. There is suspected thrombus in the right internal jugular vein (7-146). Anatomic variants: None. Review of the MIP images confirms the above findings CT Brain Perfusion Findings: Note that the patient moved during the CT perfusion acquisition. ASPECTS: 9 CBF (<30%) Volume: 71mL Perfusion (Tmax>6.0s) volume: 46mL Mismatch Volume: 2mL Infarction Location:Left frontal operculum IMPRESSION: 1. Patient moved during the CT perfusion acquisition, likely affecting the calculated core and penumbra. The 11 cc infarct core identified in the left frontal operculum is likely real, as in retrospect there is loss of gray-white differentiation in this location on the prior noncontrast head CT. 37 cc penumbra is identified scattered in the left cerebral hemisphere, though this is likely at least in part artifactual. Consider correlation with MRI to evaluate the extent of ischemia. 2. No emergent large vessel occlusion in the head. 3. High-grade (approximally 80%) in the proximal right  internal carotid artery. 4. Severe stenosis in the proximal left V1 segment and multifocal moderate to severe stenosis in the proximal left V2 segment. 5. Mixed plaque in the proximal left ICA resulting in up to approximately 30-40% stenosis. Patent right vertebral  artery. 6. Suspected thrombus in the right internal jugular vein above the level of the central venous catheter. Consider further evaluation with ultrasound. 7. Extensive osseous metastatic disease. These results were called by telephone at the time of interpretation on 06/20/2021 at 4:37 pm to provider Dr Rogene Houston , who verbally acknowledged these results. Electronically Signed   By: Valetta Mole M.D.   On: 06/20/2021 16:42    Labs:  Basic Metabolic Panel: Recent Labs  Lab 07/06/21 0528  NA 135  K 4.1  CL 102  CO2 23  GLUCOSE 106*  BUN 24*  CREATININE 1.01  CALCIUM 12.0*    CBC: Recent Labs  Lab 07/05/21 0642 07/06/21 0528  WBC 14.5* 14.3*  HGB 8.6* 8.3*  HCT 26.4* 26.4*  MCV 83.8 84.6  PLT 323 298    CBG: No results for input(s): GLUCAP in the last 168 hours.  Family history.  Mother with alcohol abuse Sister with diabetes.  Denies any colon cancer esophageal cancer or rectal cancer  Brief HPI:   Stanley Jimenez is a 66 y.o. 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  Per chart review lives alone he has good family support.  Presented 06/20/2021 with acute onset of aphasia.  Cranial CT scan negative for acute changes.  CT angiogram head and neck no emergent large vessel occlusion in the head.  High-grade approximately 80% in the proximal right ICA, severe stenosis proximal left V1 segment multifocal moderate to severe stenosis of the proximal left V2 segment.  Mixed plaque in the proximal left ICA resulting in approximate 30 to 40% stenosis.  Suspect thrombus in the right internal jugular vein above the level of the central venous catheter.  Extensive osseous metastatic disease.  MRI of the brain multiple bilateral acute  infarcts affecting multiple vascular territories the largest which located the left frontal operculum and left occipital lobe.  TCD bubble study did not indicate PFO.  Renal ultrasound unremarkable urinary tract without hydronephrosis.  Previously demonstrated multiple liver mets small amount of ascites.  Echocardiogram with ejection fraction of 55 to 60% no wall motion abnormalities grade 1 diastolic dysfunction.  Admission chemistries unremarkable except sodium 134 WBC 24,000 platelets 96,000 hemoglobin 8.8 urine drug screen positive opiates.  Vascular surgery Dr. Servando Snare consulted regards to high-grade stenosis right ICA patient asymptomatic not a candidate for surgical intervention given underlying metastatic malignancy.  Initially placed on IV heparin transition to Coumadin he did develop some hematuria Coumadin held later resumed monitoring of hemoglobin.  Currently on dysphagia #2 thin liquid diet.  Therapy evaluations completed due to patient's aphasia and decreased functional mobility was admitted for a comprehensive rehab program.   Hospital Course: KELDAN EPLIN was admitted to rehab 06/24/2021 for inpatient therapies to consist of PT, ST and OT at least three hours five days a week. Past admission physiatrist, therapy team and rehab RN have worked together to provide customized collaborative inpatient rehab.  Pertaining to patient's multiple bilateral acute infarcts largest located left frontal and occipital lobe likely secondary to embolic disease versus hypercoagulability from  esophageal cancer.  Patient would follow-up outpatient oncology services as well as neurology services.  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  Hepatic and bone mets.  Palliative care was consulted again as noted patient would follow-up oncology services.  Follow-up cervical spine films due to pain showed generalized osseous metastatic disease in the visible spine, clivus  and upper ribs as well as nondisplaced pathologic fractures of C2 spinous process, T2/T3 superior endplates, and T2 left transverse process.  Fractures were likely nonacute.  Neurosurgery consulted advised hard cervical collar for comfort.  He had been placed on Coumadin therapy for paroxysmal SVT/CVA/acute thrombus right IJ his primary MD Dr. [name] was contacted in regards to following Coumadin therapy with goal INR 2.0-3.0.  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  Thrombocytopenia acute on chronic anemia leukocytosis monitoring for any bleeding episodes again he would follow-up hematology services.  Patient also with history of prostate cancer radioactive seed implant follow-up outpatient maintained on Flomax.  Blood pressure somewhat soft Coreg discontinued.  Prediabetes hemoglobin A1c 6.3 blood sugar checks discontinued.   Blood pressures were monitored on TID basis and soft and monitored     Rehab course: During patient's stay in rehab weekly team conferences were held to monitor patient's progress, set goals and discuss barriers to discharge. At admission, patient required minimal assist 160 feet rolling walker  Physical exam.  Blood pressure 105/60 pulse 80 temperature 98 respirations 18 oxygen saturations 92% room air Constitutional.  No acute distress HEENT Head.  Normocephalic and atraumatic Eyes.  Pupils round and reactive to light no discharge without nystagmus Neck.  Supple nontender no JVD without thyromegaly Cardiac regular rate rhythm not extra sounds or murmur heard Abdomen.  Soft nontender positive bowel sounds without rebound Respiratory effort normal no respiratory distress without wheeze Skin.  Intact Neurologic.  Alert expressive receptive aphasia.  Follows minimal commands.  Strength 5/5 throughout.  He/She  has had improvement in activity tolerance, balance,  postural control as well as ability to compensate for deficits. He/She has had improvement in functional use RUE/LUE  and RLE/LLE as well as improvement in awareness.  Patient with steady overall progress.  He did need some encouragement to participate.  Worked on donning shoes edge of bed.  Completed stand pivot to the recliner with contact-guard.  It was discussed with family need for supervision assist for safety.  Patient demonstrated improvement with comprehension and consistency of yes no responses.  Follow-up speech therapy for dysphagia and education with family.  Full family teaching completed plan discharged to home       Disposition: Discharge to home    Diet: Dysphagia #2 thin liquids  Special Instructions: No driving smoking or alcohol  Hard cervical collar for comfort  Home health nurse to check INR 07/10/2021 results to Dr. [name] [phone] fax number [phone]  07/07/2021 prothrombin time 25.5.  INR 2.3  Medications at discharge. 1.  Tylenol as needed 2.  Lidoderm patch as directed 3.  Senokot S2 tabs nightly 4.  Flomax 0.4 mg daily 5.  Coumadin with latest dose 3 mg daily with goal INR 2.0-3.0 6.  Megace 400 mg daily 7.Fluorouracil IV every 14 days 8.Leucovorin calcium every 14 days 9.Oxaliplatin IV every 14 days   30-35 minutes were spent completing discharge summary and discharge planning  Discharge Instructions     Ambulatory referral to Neurology   Complete  by: As directed    An appointment is requested in approximately: 4 weeks multiple bilateral infarcts left frontal occipital likely embolic   Ambulatory referral to Physical Medicine Rehab   Complete by: As directed    Moderate complexity follow-up 1 to 2 weeks multiple bilateral infarcts left frontal occipital        Follow-up Information     Kirsteins, Luanna Salk, MD Follow up.   Specialty: Physical Medicine and Rehabilitation Why: Office to call for appointment Contact  information: [place] [address] [phone]         Derek Jack, MD Follow up.   Specialty: Hematology Why: Call for appointment Contact information: [place] [address] [phone]         [name], MD Follow up.   Specialty: Family Medicine Contact information: [address] [address] [phone]         Marvis Moeller, NP Follow up.   Specialty: Neurosurgery Why: Call for appointment as needed Contact information: [place] [address] [place] [address] [phone]                 Signed: Lavon Paganini [place] 07/07/2021, 5:30 AM

## 2021-07-05 NOTE — Plan of Care (Signed)
°  Problem: Consults Goal: RH STROKE PATIENT EDUCATION Description: See Patient Education module for education specifics  Outcome: Progressing   Problem: RH BOWEL ELIMINATION Goal: RH STG MANAGE BOWEL WITH ASSISTANCE Description: STG Manage Bowel with mod I Assistance. Outcome: Progressing   Problem: RH BLADDER ELIMINATION Goal: RH STG MANAGE BLADDER WITH ASSISTANCE Description: STG Manage Bladder Without Assistance Outcome: Progressing   Problem: RH SAFETY Goal: RH STG ADHERE TO SAFETY PRECAUTIONS W/ASSISTANCE/DEVICE Description: STG Adhere to Safety Precautions With cues Assistance/Device. Outcome: Not Progressing   Problem: RH KNOWLEDGE DEFICIT Goal: RH STG INCREASE KNOWLEDGE OF DIABETES Description: Patient and brother will be able to manage prediabetes with medications and dietary modifications using handouts and educational resources independently Outcome: Progressing Goal: RH STG INCREASE KNOWLEDGE OF HYPERTENSION Description: Patient and brother will be able to manage HTN with medications and dietary modifications using handouts and educational resources independently Outcome: Progressing Goal: RH STG INCREASE KNOWLEDGE OF DYSPHAGIA/FLUID INTAKE Description: Patient and brother will be able to manage Dysphagia, medications and dietary modifications using handouts and educational resources independently Outcome: Progressing Goal: RH STG INCREASE KNOWLEGDE OF HYPERLIPIDEMIA Description: Patient and brother will be able to manage HLD with medications and dietary modifications using handouts and educational resources independently Outcome: Progressing Goal: RH STG INCREASE KNOWLEDGE OF STROKE PROPHYLAXIS Description: Patient and brother will be able to manage secondary stroke risks with medications and dietary modifications using handouts and educational resources independently Outcome: Progressing

## 2021-07-05 NOTE — Progress Notes (Signed)
Palliative Medicine Inpatient Follow Up Note   HPI: 66 year old male with history of paroxysmal SCT, thrombocytopenia, hyperlipidemia, prostate cancer with radioactive sed implant in 2006, history of squamous cell carcinoma of esophagus currently undergoing chemotherapy, alcohol abuse currently in remission, hypertension, previous smoker- quit 7 months ago.  Present 1/28 with acute onset aphasia.  CT angio of head and heck no emergent large vessel occlusion in the head. High grade approximately 800% in the proximal right ICA, severe stenosis proximal left V1 segment multifocal moderate to severe stenosis of the proximal left V2 segment. Mixed plaque in the proximal left ICA approximately 30-40 % stenosis. Suspected thrombus in the right IJ vein above the level of the Central venous catheter. Extensive osseous metastatic disease. MRI of brain with multiple bilateral acute infarcts with largest  in left frontal operculum and left occipital lobe.  Renal US unremarkable. Previous multiple liver metastasis with small amount ascites. Echo 55-60% , grade 1 diastolic dysfunction.  Vascular consulted and not good candidate for surgical intervention with underlying metastatic disease. Neurosurgery reviewed scans, diffuse metastatic disease without acute or unstable pathology, recommended hard cervical collar for comfort with neck pain. Palliative radiation may be an option. In intensive IP rehab currently. Patient is unaware of osseous mets and family has asked that he not be told.   Today's Discussion 07/05/2021, no family present during my visit  Chart reviewed inclusive of vital signs, progress notes, laboratory results, and diagnostic images.   Patient denies pain. Can nod head and point in response to questions with yes/no responses to questions. He ate most of his meal tray. Denies neck pain presently.  Support Provided.   Objective Assessment: Vital Signs Vitals:   07/05/21 0438 07/05/21 1333  BP:  111/67 117/61  Pulse: 96 98  Resp: 20 15  Temp: 97.6 F (36.4 C) 98.3 F (36.8 C)  SpO2: 100% 100%    Intake/Output Summary (Last 24 hours) at 07/05/2021 1611 Last data filed at 07/05/2021 5427 Gross per 24 hour  Intake 118 ml  Output --  Net 118 ml     Gen:  NAD, cachetic HEENT: normocephalic, nontraumatic, moist mucous membranes CV: Regular  rhythm, tachycardic PULM: respirations nonlabored, normal respiratory effort ABD: soft/nontender/nondistended/normal bowel sounds Skin: warm and dry EXT: No edema Neuro: Alert, expressive aphasia but able to nod and answer yes/no to questions.  SUMMARY OF RECOMMENDATIONS   Code Status/Advance Care Planning: FULL CODE   Symptom Management:  Pain: Acetaminophen prn Lack of appetite: Megestrol Oral thrush: nystatin Constipation: senna-docusate, polyethylene glycol added Deconditioning: continue OT and PT Expressive aphasia: SLP   Additional Recommendations (Limitations, Scope, Preferences): Full scope of treatment Family request patient not be told about metastasis   Psycho-social/Spiritual:  Desire for further Chaplaincy support: No, brother Letitia Libra is pastor   Prognosis: 66 year old with metastatic disease.    Discharge Planning: Home with family, home health with therapies and community based palliative care. The family may benefit from continued goals of care conversations.   Thank you for the opportunity to participate in the care of this patient. Time Spent: 25 minutes _________________________________________________________________________ Lindell Spar, NP [facility] Palliative Medicine Team Team Cell Phone: [phone] Please utilize secure chat with additional questions, if there is no response within 30 minutes please call the above phone number  Palliative Medicine Team providers are available by phone from 7am to 7pm daily and can be reached through the team cell phone.  Should this patient require assistance  outside of  these hours, please call the patient's attending physician.

## 2021-07-06 ENCOUNTER — Inpatient Hospital Stay: Payer: Medicaid Other | Attending: Neurosurgery

## 2021-07-06 ENCOUNTER — Encounter (HOSPITAL_COMMUNITY): Payer: Self-pay | Admitting: Physical Medicine & Rehabilitation

## 2021-07-06 ENCOUNTER — Other Ambulatory Visit: Payer: Self-pay | Admitting: Radiation Therapy

## 2021-07-06 DIAGNOSIS — I6340 Cerebral infarction due to embolism of unspecified cerebral artery: Secondary | ICD-10-CM | POA: Diagnosis not present

## 2021-07-06 DIAGNOSIS — C7951 Secondary malignant neoplasm of bone: Secondary | ICD-10-CM

## 2021-07-06 LAB — CBC
HCT: 26.4 % — ABNORMAL LOW (ref 39.0–52.0)
Hemoglobin: 8.3 g/dL — ABNORMAL LOW (ref 13.0–17.0)
MCH: 26.6 pg (ref 26.0–34.0)
MCHC: 31.4 g/dL (ref 30.0–36.0)
MCV: 84.6 fL (ref 80.0–100.0)
Platelets: 298 10*3/uL (ref 150–400)
RBC: 3.12 MIL/uL — ABNORMAL LOW (ref 4.22–5.81)
RDW: 21 % — ABNORMAL HIGH (ref 11.5–15.5)
WBC: 14.3 10*3/uL — ABNORMAL HIGH (ref 4.0–10.5)
nRBC: 0 % (ref 0.0–0.2)

## 2021-07-06 LAB — BASIC METABOLIC PANEL
Anion gap: 10 (ref 5–15)
BUN: 24 mg/dL — ABNORMAL HIGH (ref 8–23)
CO2: 23 mmol/L (ref 22–32)
Calcium: 12 mg/dL — ABNORMAL HIGH (ref 8.9–10.3)
Chloride: 102 mmol/L (ref 98–111)
Creatinine, Ser: 1.01 mg/dL (ref 0.61–1.24)
GFR, Estimated: >60 mL/min (ref >60)
Glucose, Bld: 106 mg/dL — ABNORMAL HIGH (ref 70–99)
Potassium: 4.1 mmol/L (ref 3.5–5.1)
Sodium: 135 mmol/L (ref 135–145)

## 2021-07-06 LAB — PROTIME-INR
INR: 2.1 — ABNORMAL HIGH (ref 0.8–1.2)
Prothrombin Time: 23.9 seconds — ABNORMAL HIGH (ref 11.4–15.2)

## 2021-07-06 MED ORDER — WARFARIN SODIUM 4 MG PO TABS
4.0000 mg | ORAL_TABLET | Freq: Once | ORAL | Status: AC
  Administered 2021-07-06: 4 mg via ORAL
  Filled 2021-07-06: qty 1

## 2021-07-06 NOTE — Plan of Care (Signed)
Problem: RH Balance Goal: LTG Patient will maintain dynamic standing with ADLs (OT) Description: LTG:  Patient will maintain dynamic standing balance with assist during activities of daily living (OT)  Outcome: Not Met (add Reason) Flowsheets (Taken 07/06/2021 1730) LTG: Pt will maintain dynamic standing balance during ADLs with: (Goal not met due to limited therapy participation due to ongoing neck pain and slower than anticipated progress.) --   Problem: Sit to Stand Goal: LTG:  Patient will perform sit to stand in prep for activites of daily living with assistance level (OT) Description: LTG:  Patient will perform sit to stand in prep for activites of daily living with assistance level (OT) Outcome: Not Met (add Reason) Flowsheets (Taken 07/06/2021 1730) LTG: PT will perform sit to stand in prep for activites of daily living with assistance level: (Goal not met due to limited therapy participation due to ongoing neck pain and slower than anticipated progress.) --   Problem: RH Grooming Goal: LTG Patient will perform grooming w/assist,cues/equip (OT) Description: LTG: Patient will perform grooming with assist, with/without cues using equipment (OT) Outcome: Not Met (add Reason) Flowsheets (Taken 07/06/2021 1730) LTG: Pt will perform grooming with assistance level of: (Goal not met due to limited therapy participation due to ongoing neck pain and slower than anticipated progress.) --   Problem: RH Bathing Goal: LTG Patient will bathe all body parts with assist levels (OT) Description: LTG: Patient will bathe all body parts with assist levels (OT) Outcome: Not Met (add Reason) Flowsheets (Taken 07/06/2021 1730) LTG: Pt will perform bathing with assistance level/cueing: (Goal not met due to limited therapy participation due to ongoing neck pain and slower than anticipated progress.) --   Problem: RH Toileting Goal: LTG Patient will perform toileting task (3/3 steps) with assistance level  (OT) Description: LTG: Patient will perform toileting task (3/3 steps) with assistance level (OT)  Outcome: Not Met (add Reason) Flowsheets (Taken 07/06/2021 1730) LTG: Pt will perform toileting task (3/3 steps) with assistance level: (Goal not met due to limited therapy participation due to ongoing neck pain and slower than anticipated progress.) --   Problem: RH Simple Meal Prep Goal: LTG Patient will perform simple meal prep w/assist (OT) Description: LTG: Patient will perform simple meal prep with assistance, with/without cues (OT). Outcome: Not Met (add Reason) Flowsheets (Taken 07/06/2021 1730) LTG: Pt will perform simple meal prep with assistance level of: (Goal not met due to limited therapy participation due to ongoing neck pain and slower than anticipated progress.) --   Problem: RH Light Housekeeping Goal: LTG Patient will perform light housekeeping w/assist (OT) Description: LTG: Patient will perform light housekeeping with assistance, with/without cues (OT). Outcome: Not Met (add Reason) Flowsheets (Taken 07/06/2021 1730) LTG: Pt will perform light housekeeping with assistance level of: (Goal not met due to limited therapy participation due to ongoing neck pain and slower than anticipated progress.) --   Problem: RH Toilet Transfers Goal: LTG Patient will perform toilet transfers w/assist (OT) Description: LTG: Patient will perform toilet transfers with assist, with/without cues using equipment (OT) Outcome: Not Met (add Reason) Flowsheets (Taken 07/06/2021 1730) LTG: Pt will perform toilet transfers with assistance level of: (Goal not met due to limited therapy participation due to ongoing neck pain and slower than anticipated progress.) --   Problem: RH Awareness Goal: LTG: Patient will demonstrate awareness during functional activites type of (OT) Description: LTG: Patient will demonstrate awareness during functional activites type of (OT) Outcome: Not Met (add  Reason) Flowsheets (Taken 07/06/2021 1730)  LTG: Patient will demonstrate awareness during functional activites type of (OT): (Goal not met due to limited therapy participation due to ongoing neck pain and slower than anticipated progress.) --   Problem: RH Dressing Goal: LTG Patient will perform upper body dressing (OT) Description: LTG Patient will perform upper body dressing with assist, with/without cues (OT). Outcome: Completed/Met Goal: LTG Patient will perform lower body dressing w/assist (OT) Description: LTG: Patient will perform lower body dressing with assist, with/without cues in positioning using equipment (OT) Outcome: Completed/Met   Problem: RH Functional Use of Upper Extremity Goal: LTG Patient will use RT/LT upper extremity as a (OT) Description: LTG: Patient will use right/left upper extremity as a stabilizer/gross assist/diminished/nondominant/dominant level with assist, with/without cues during functional activity (OT) Outcome: Completed/Met   Problem: RH Furniture Transfers Goal: LTG Patient will perform furniture transfers w/assist (OT/PT) Description: LTG: Patient will perform furniture transfers  with assistance (OT/PT). Outcome: Completed/Met

## 2021-07-06 NOTE — Plan of Care (Signed)
Problem: RH Balance Goal: LTG Patient will maintain dynamic standing balance (PT) Description: LTG:  Patient will maintain dynamic standing balance with assistance during mobility activities (PT) Outcome: Adequate for Discharge Flowsheets (Taken 07/06/2021 1611) LTG: Pt will maintain dynamic standing balance during mobility activities with:: Contact Guard/Touching assist   Problem: RH Bed Mobility Goal: LTG Patient will perform bed mobility with assist (PT) Description: LTG: Patient will perform bed mobility with assistance, with/without cues (PT). Outcome: Adequate for Discharge Flowsheets (Taken 07/06/2021 1611) LTG: Pt will perform bed mobility with assistance level of: Independent with assistive device    Problem: RH Bed to Chair Transfers Goal: LTG Patient will perform bed/chair transfers w/assist (PT) Description: LTG: Patient will perform bed to chair transfers with assistance (PT). Outcome: Adequate for Discharge Flowsheets (Taken 07/06/2021 1611) LTG: Pt will perform Bed to Chair Transfers with assistance level: Contact Guard/Touching assist   Problem: RH Car Transfers Goal: LTG Patient will perform car transfers with assist (PT) Description: LTG: Patient will perform car transfers with assistance (PT). Outcome: Adequate for Discharge Flowsheets (Taken 07/06/2021 1611) LTG: Pt will perform car transfers with assist:: Contact Guard/Touching assist   Problem: RH Furniture Transfers Goal: LTG Patient will perform furniture transfers w/assist (OT/PT) Description: LTG: Patient will perform furniture transfers  with assistance (OT/PT). Outcome: Adequate for Discharge Flowsheets (Taken 07/06/2021 1611) LTG: Pt will perform furniture transfers with assist:: Contact Guard/Touching assist   Problem: RH Ambulation Goal: LTG Patient will ambulate in controlled environment (PT) Description: LTG: Patient will ambulate in a controlled environment, # of feet with assistance (PT). Outcome:  Adequate for Discharge Flowsheets Taken 07/06/2021 1611 by Alger Simons, PT LTG: Pt will ambulate in controlled environ  assist needed:: Minimal Assistance - Patient > 75% Taken 06/25/2021 2240 by [name], PT LTG: Ambulation distance in controlled environment: 237ft with LRAD   Problem: RH Stairs Goal: LTG Patient will ambulate up and down stairs w/assist (PT) Description: LTG: Patient will ambulate up and down # of stairs with assistance (PT) Outcome: Adequate for Discharge Flowsheets Taken 07/06/2021 1611 by Alger Simons, PT LTG: Pt will ambulate up/down stairs assist needed:: Minimal Assistance - Patient > 75% Taken 06/25/2021 2240 by [name], PT LTG: Pt will  ambulate up and down number of stairs: 12 steps with UE Note: Requires rest break to complete 12 with Bil HR   Problem: RH Ambulation Goal: LTG Patient will ambulate in home environment (PT) Description: LTG: Patient will ambulate in home environment, # of feet with assistance (PT). Outcome: Completed/Met Flowsheets Taken 07/06/2021 1611 by Alger Simons, PT LTG: Pt will ambulate in home environ  assist needed:: Contact Guard/Touching assist Taken 06/25/2021 2240 by [name], PT LTG: Ambulation distance in home environment: 66ft with LRAD

## 2021-07-06 NOTE — Progress Notes (Signed)
Inpatient Rehabilitation Care Coordinator Discharge Note   Patient Details  Name: Stanley Jimenez MRN: [id number] Date of Birth: [birth year]   Discharge location: Home  Length of Stay: 13 days  Discharge activity level: Sup/Min  Home/community participation: brother and other family members  Patient response RE:[id number] Literacy - How often do you need to have someone help you when you read instructions, pamphlets, or other written material from your doctor or pharmacy?: Patient unable to respond  Patient response [id number] Isolation - How often do you feel lonely or isolated from those around you?: Patient unable to respond  Services provided included: SW, Pharmacy, CM, TR, RN, SLP, OT, RD, MD, PT  Financial Services:  Financial Services Utilized: Medicaid    Choices offered to/list presented to: n/a  Follow-up services arranged:  [place]: [facility]         Patient response to transportation need: Is the patient able to respond to transportation needs?: No In the past 12 months, has lack of transportation kept you from medical appointments or from getting medications?: No In the past 12 months, has lack of transportation kept you from meetings, work, or from getting things needed for daily living?: No    Comments (or additional information):  Patient/Family verbalized understanding of follow-up arrangements:  Yes  Individual responsible for coordination of the follow-up plan: [name] [phone]  Confirmed correct DME delivered: Dyanne Iha 07/06/2021    Dyanne Iha

## 2021-07-06 NOTE — Progress Notes (Signed)
PROGRESS NOTE   Subjective/Complaints:  Pt aphasic but handed me his hard collar shaking his head   ROS-cannot assess due to aphasia  Objective:   No results found. Recent Labs    07/05/21 0642 07/06/21 0528  WBC 14.5* 14.3*  HGB 8.6* 8.3*  HCT 26.4* 26.4*  PLT 323 298     Recent Labs    07/06/21 0528  NA 135  K 4.1  CL 102  CO2 23  GLUCOSE 106*  BUN 24*  CREATININE 1.01  CALCIUM 12.0*     Intake/Output Summary (Last 24 hours) at 07/06/2021 0931 Last data filed at 07/06/2021 0811 Gross per 24 hour  Intake 297 ml  Output --  Net 297 ml          Physical Exam: Vital Signs Blood pressure 124/67, pulse 99, temperature (!) 97.4 F (36.3 C), temperature source Oral, resp. rate 18, SpO2 100 %.   General: No acute distress Mood and affect are appropriate Heart: Regular rate and rhythm no rubs murmurs or extra sounds Lungs: Clear to auscultation, breathing unlabored, no rales or wheezes Abdomen: Positive bowel sounds, soft nontender to palpation, nondistended Extremities: No clubbing, cyanosis, or edema   Neurologic: Cranial nerves II through XII intact, motor strength is 5/5 in L and 4/5 Right deltoid, bicep, tricep, grip, hip flexor, knee extensors, ankle dorsiflexor and plantar flexor Sensory exam cannot assess due to aphasia  Aphasia Exp> receptive, can follow simple commands point to white board when asked his name  Musculoskeletal: No pain to palp C 2 spinous process     Assessment/Plan: 1. Functional deficits which require 3+ hours per day of interdisciplinary therapy in a comprehensive inpatient rehab setting. Physiatrist is providing close team supervision and 24 hour management of active medical problems listed below. Physiatrist and rehab team continue to assess barriers to discharge/monitor patient progress toward functional and medical goals  Care Tool:  Bathing    Body parts  bathed by patient: Buttocks   Body parts bathed by helper: Buttocks, Front perineal area Body parts n/a: Right arm, Left arm, Chest, Abdomen, Right upper leg, Left upper leg, Right lower leg, Left lower leg, Front perineal area, Buttocks   Bathing assist Assist Level: Maximal Assistance - Patient 24 - 49%     Upper Body Dressing/Undressing Upper body dressing   What is the patient wearing?: Pull over shirt    Upper body assist Assist Level: Minimal Assistance - Patient > 75%    Lower Body Dressing/Undressing Lower body dressing      What is the patient wearing?: Incontinence brief, Pants     Lower body assist Assist for lower body dressing: Minimal Assistance - Patient > 75%     Toileting Toileting    Toileting assist Assist for toileting: Contact Guard/Touching assist     Transfers Chair/bed transfer  Transfers assist     Chair/bed transfer assist level: Contact Guard/Touching assist     Locomotion Ambulation   Ambulation assist      Assist level: Contact Guard/Touching assist Assistive device: No Device Max distance: 200'   Walk 10 feet activity   Assist     Assist level: Minimal Assistance -  Patient > 75% Assistive device: Walker-rolling   Walk 50 feet activity   Assist    Assist level: Minimal Assistance - Patient > 75% Assistive device: Walker-rolling    Walk 150 feet activity   Assist    Assist level: Minimal Assistance - Patient > 75% Assistive device: Walker-rolling    Walk 10 feet on uneven surface  activity   Assist     Assist level: Minimal Assistance - Patient > 75% Assistive device: Walker-rolling   Wheelchair     Assist Is the patient using a wheelchair?: No             Wheelchair 50 feet with 2 turns activity    Assist            Wheelchair 150 feet activity     Assist          Blood pressure 124/67, pulse 99, temperature (!) 97.4 F (36.3 C), temperature source Oral, resp. rate  18, SpO2 100 %.  Medical Problem List and Plan: 1. Functional deficits secondary to multiple bilateral acute infarcts largest located left frontal and occipital lobe likely secondary to embolic disease 5/63/14  versus hypercoagulability from esophageal cancer             -patient may shower             -ELOS/Goals: 2/14             Continue CIR C spine without instability OK for full therapy per NS, can switch to soft collar for comfort / pain  since  pt finds hard collar too restrictive or uncomfortable    Discussed C spine films with NS Dr Glenford Peers who advised CT C spine  and CT C spine with Dr Arnoldo Morale ( on call) with plans to have Dr Marcello Moores or Glenford Peers to f/u- no signs of myelopathy , may have C2 epidural tumor without spinal co rd compromise, will involve palliative care   -Would like NS to address stability of C spine, non displace fx do not appear acute per neuroradiology report,     Con't CIR as tolerated 2.  Antithrombotics: -DVT/anticoagulation:  Pharmaceutical: Coumadin INR 3.3 no signs of bleeding , pharmacy to adjust per protocol   2/12- INR 2.3- con't regimen per pharmacy             -antiplatelet therapy: none  3. Pain Management: Tylenol as needed. Lidocaine patch added Difficult to assess due to severe exp aphasia but on exam most consistent wit neck pain , given hx of osseous mets will check C spine xray , last xray was 2013 pre cancer, showed degenerative changes with spondylolisthesis, degenerative most pronounced at C 3-4 , has tenderness over C2 spinous process corresponding with met   2/12- denies pain today when asked 2 different ways?- con't regimen 4. Mood: Provide emotional support             -antipsychotic agents: N/A 5. Neuropsych: This patient is not capable of making decisions on his own behalf. 6. Skin/Wound Care: Routine skin checks 7. Hyponatremia: repeat Na tomorrow.  8.  Acute thrombus right IJ.  anticoagulation on IV heparin , until INR >2.0, 2/3 INR  1.9 2/12- INR 2.3- off heparin  9.  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  Not a candidate for intervention due to pre-existing metastatic cancer 10.  Thrombocytopenia/acute on chronic anemia/leukocytosis.  Follow-up CBC 11.  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 Hepatic and bone  mets , as discussed with brother will ask Palliative to address [place]. Follow-up Dr.Katragadda who will see pt ~2wks post d/c, appreciate palliative consult, family still wanting "everything done"  Follow-up chemotherapy as outpatient 12.  Prostate cancer with radioactive seed implant.  Follow-up outpatient.  Continue Flomax 0.4 mg daily 13.  Hypertension.  Well controlled. Monitor with increased mobility.  Home medication Norvasc 10 mg daily prior to admission.  Resume as needed.  Vitals:   07/05/21 1913 07/06/21 0311  BP: 109/63 124/67  Pulse: (!) 103 99  Resp: 18 18  Temp: 98 F (36.7 C) (!) 97.4 F (36.3 C)  SpO2: 100% 100%   2/13- BP better controlled- con't regimen 14.  Paroxysmal SVT.  Tachycardic. Continue Coreg 3.$RemoveBeforeDE'125mg'[id number]$  BID.  15. Prediabetes: Hgb A1c 6.3 on 06/21/21- prior CBGs well controlled, not monitoring in rehab.  16. Thrush: start nystatin 17. Reflux of food:  KUB shows no obstruction, discussed with patient 18. Decreased appetite: start megace 19. Kidney stones: IVF overnight  20. Constipation  2/11- give sorbitol for BM- last BM at least 3 days ago.   2/12- LBM this AM- not incontinent per chart  LOS: 12 days A FACE TO [place] E Demeshia Sherburne 07/06/2021, 9:31 AM

## 2021-07-06 NOTE — Progress Notes (Signed)
Occupational Therapy Discharge Summary  Patient Details  Name: Stanley Jimenez MRN: [id number] Date of Birth: 1955-09-17    Patient has met 4 of 5 long term goals due to improved activity tolerance, improved balance, postural control, ability to compensate for deficits, functional use of  RIGHT upper extremity, improved attention, improved awareness, and improved coordination.  Patient to discharge at overall  CGA  level.  Patient's care partner is independent to provide the necessary physical and cognitive assistance at discharge.  Pt to DC at the below ADL/bathroom transfer performance level. Assist levels represent pt's most consistent performance when alert/participatory. Pt continues to be primarily limited by ongoing severe neck pain, limited activity tolerance, generalized feelings of discomfort in setting of metastatic cancer diagnoses. Family education completed on 07/06/2021 with caregivers demonstrating good understanding of patient's current impairments and impact on functional mobility/ADL performance and safety. Pt and caregivers will benefit from continued [facility] OT to facilitate improved caregiver education, functional mobility, and occupational performance.    Reasons goals not met: Pt did not meet the majority of his goals due to increased severe neck pain in later half of pt's CIR stay resulting in poor therapy participation and requiring QD therapy services.  Recommendation:  Patient will benefit from ongoing skilled OT services in home health setting to continue to advance functional skills in the area of BADL, iADL, and Reduce care partner burden.  Equipment: 3in1, shower chair  Reasons for discharge: discharge from hospital  Patient/family agrees with progress made and goals achieved: Yes  OT Discharge Precautions/Restrictions  Precautions Precautions: Fall;Cervical;Other (comment) (Pt to wear soft collar prn for pain) Precaution Comments: expressive > receptive aphasia,  R inattention Restrictions Weight Bearing Restrictions: Yes LUE Weight Bearing: Non weight bearing Other Position/Activity Restrictions: no resistance training to LUE  ADL ADL Eating: Supervision/safety, Set up Where Assessed-Eating: Edge of bed, Bed level Grooming: Supervision/safety Where Assessed-Grooming: Edge of bed Upper Body Bathing: Supervision/safety Where Assessed-Upper Body Bathing: Edge of bed Lower Body Bathing: Contact guard Where Assessed-Lower Body Bathing: Edge of bed Upper Body Dressing: Minimal assistance Where Assessed-Upper Body Dressing: Edge of bed Lower Body Dressing: Minimal assistance Where Assessed-Lower Body Dressing: Edge of bed Toileting: Contact guard Where Assessed-Toileting: Glass blower/designer: Therapist, music Method: Counselling psychologist: Grab bars Tub/Shower Transfer: Metallurgist Method: Optometrist: Grab bars, Shower seat with back Social research officer, government: Not assessed Vision Baseline Vision/History: 0 No visual deficits Patient Visual Report: Other (comment) (Pt unable to state 2/2 global aphasia) Vision Assessment?: Vision impaired- to be further tested in functional context Eye Alignment: Within Functional Limits Alignment/Gaze Preference: Within Defined Limits Visual Fields: Right visual field deficit Perception  Perception: Impaired Inattention/Neglect: Does not attend to right visual field;Does not attend to right side of body Praxis Praxis: Impaired Praxis Impairment Details: Motor planning;Ideomotor Cognition Overall Cognitive Status: Impaired/Different from baseline Arousal/Alertness: Awake/alert Orientation Level: Oriented to person;Oriented to place;Oriented to time Year: Other (Comment) (unable to state when asked secondary to expressive aphasia) Month:  (unable to state when asked secondary to expressive aphasia) Day of Week: Other (Comment)  (unable to state when asked secondary to expressive aphasia) Attention: Selective Sustained Attention: Impaired Sustained Attention Impairment: Functional basic;Verbal basic Selective Attention: Impaired Memory: Impaired Immediate Memory Recall:  (unable to state when asked secondary to expressive aphasia) Memory Recall Sock:  (unable to state when asked secondary to expressive aphasia) Memory Recall Blue:  (unable to state when asked secondary to expressive aphasia) Memory  Recall Bed:  (unable to state when asked secondary to expressive aphasia) Awareness: Impaired Awareness Impairment: Intellectual impairment;Emergent impairment Problem Solving: Impaired Problem Solving Impairment: Functional basic Safety/Judgment: Impaired Comments: Difficulty fully assessing 2/2 global aphasia. Sensation Sensation Light Touch: Impaired Detail Light Touch Impaired Details: Impaired RUE;Impaired RLE Hot/Cold: Not tested Proprioception: Impaired by gross assessment Stereognosis: Impaired Detail Additional Comments: limited by R inattention to RUE, improved from eval; Diffulcty fully assessing 2/2 global aphasia. Coordination Gross Motor Movements are Fluid and Coordinated: No Fine Motor Movements are Fluid and Coordinated: No Coordination and Movement Description: mild R sided hemiplegia Finger Nose Finger Test: unable to follow commands Heel Shin Test: able to follow commands but pt now lethargic from progression of multiple comorbidities 9 Hole Peg Test: R: 1 min 55 seconds; L: 1 min 3 seconds Motor  Motor Motor: Hemiplegia;Motor apraxia Motor - Discharge Observations: improving R hemi; continued R sided visual impairment vs inattention that affects ability to perform safe mobility Mobility  Bed Mobility Bed Mobility: Supine to Sit;Sit to Supine Supine to Sit: Supervision/Verbal cueing Transfers Sit to Stand: Contact Guard/Touching assist Stand to Sit: Contact Guard/Touching assist   Trunk/Postural Assessment  Cervical Assessment Cervical Assessment: Exceptions to [facility] (cervical flexion) Thoracic Assessment Thoracic Assessment: Exceptions to [facility] (rounded soulders) Lumbar Assessment Lumbar Assessment: Exceptions to [facility] (anterior pelvic tilt in standing) Postural Control Postural Control: Within Functional Limits  Balance Balance Balance Assessed: Yes Static Sitting Balance Static Sitting - Balance Support: Feet supported;No upper extremity supported Static Sitting - Level of Assistance: 5: Stand by assistance Dynamic Sitting Balance Dynamic Sitting - Balance Support: Feet supported;No upper extremity supported Dynamic Sitting - Level of Assistance: 5: Stand by assistance Static Standing Balance Static Standing - Balance Support: During functional activity;No upper extremity supported Static Standing - Level of Assistance: 5: Stand by assistance Dynamic Standing Balance Dynamic Standing - Balance Support: During functional activity;No upper extremity supported Dynamic Standing - Level of Assistance: 5: Stand by assistance;4: Min assist Extremity/Trunk Assessment RUE Assessment RUE Assessment: Exceptions to [facility] General Strength Comments: unable to follow commands for MMT, mild hemiparesis and R inattention, moderately improved from eval and pt using dominantly for most ADL tasks LUE Assessment LUE Assessment: Within Functional Limits General Strength Comments: grossly WFL for ADL assessed; unable to follow commands for MMT   Volanda Napoleon MS, OTR/L  07/06/2021, 5:44 PM

## 2021-07-06 NOTE — Progress Notes (Signed)
Speech Language Pathology Discharge Summary  Patient Details  Name: Stanley Jimenez MRN: [id number] Date of Birth: 03-21-1956  Today's Date: 07/06/2021 SLP Individual Time: 1300-1330 SLP Individual Time Calculation (min): 30 min   Skilled Therapeutic Interventions:  Skilled ST services focused on education. SLP facilitated with pt's two brothers. SLP provided handouts on aphasia, apraxia, dys 2 textures and dys 3 textures. SLP provided education pertaining to expressive language utilizing yes/no question and clarifying communication with multimodal sources such as a Data processing manager. Pt was able to copy at letter level in witting task. 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 Pt is able to follow 1 step commands with mod-min A. SLP recommends PO consumption of dys 2 textures due to oral deficits, but snack of dys 3 textures are acceptable. All questions were answered to satisfaction. Pt was left in room with family, call bell within reach and bed alarm set. SLP recommends to continue skilled services.    Patient has met 3 of 3 long term goals.  Patient to discharge at overall Supervision;Min;Mod level.  Reasons goals not met:     Clinical Impression/Discharge Summary:   Pt made good progress meeting 3 out 3 goals, discharging at mod A for language and supervision A for swallow function. Pt is consuming dys 2 textures and thin liquids with supervision A as well as participating in dys 3 texture trials, however due to oral deficits recommend remaining on dys 2 textures at this time. Pt required max A for verbal expression at CV level and Mod A for communication utilizing communication board. Pt is able to answer yes/no questions but requires clarification for accuracy. 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 Pt is able to follow 1 step directions with min-mod A verbal cues. 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 Education was completed with pt's two brothers and questions answered to satisfaction. Pt benefited from skilled ST services in order to maximize functional independence and reduce burden of care,  requiring 24 hour supervision at discharge with continued skilled ST services.  Care Partner:  Caregiver Able to Provide Assistance: Yes  Type of Caregiver Assistance: Physical;Cognitive  Recommendation:  24 hour supervision/assistance;Home Health SLP  Rationale for SLP Follow Up: Maximize functional communication;Maximize cognitive function and independence;Maximize swallowing safety;Reduce caregiver burden   Equipment: N/A   Reasons for discharge: Discharged from hospital   Patient/Family Agrees with Progress Made and Goals Achieved: Yes    [name]  [facility] 07/06/2021, 4:38 PM

## 2021-07-06 NOTE — Progress Notes (Signed)
Inpatient Rehabilitation Discharge Medication Review by a Pharmacist  A complete drug regimen review was completed for this patient to identify any potential clinically significant medication issues.  High Risk Drug Classes Is patient taking? Indication by Medication  Antipsychotic No   Anticoagulant Yes  coumadin- DVT/CVA  Antibiotic No   Opioid No   Antiplatelet No   Hypoglycemics/insulin No   Vasoactive Medication Yes Tamsulosin- BPH  Chemotherapy No   Other Yes Megace - appetite stimulant     Type of Medication Issue Identified Description of Issue Recommendation(s)  Drug Interaction(s) (clinically significant)     Duplicate Therapy     Allergy     No Medication Administration End Date     Incorrect Dose     Additional Drug Therapy Needed     Significant med changes from prior encounter (inform family/care partners about these prior to discharge).    Other  PTA meds: FOLFOX + Opdivo q14d. Cycle #8 not given on 06/15/2021 d/t dizziness/weight loss F/up with oncology for Squamous Cell Carcinoma of the esophagus at discharge or during stay, if required based on CEA results and Oncology recommendations    Clinically significant medication issues were identified that warrant physician communication and completion of prescribed/recommended actions by midnight of the next day:  No  Name of provider notified for urgent issues identified:   Provider Method of Notification:     Pharmacist comments:   Time spent performing this drug regimen review (minutes):  [place], PharmD, [place], AAHIVP, CPP Infectious Disease Pharmacist 07/06/2021 2:06 PM

## 2021-07-06 NOTE — Progress Notes (Signed)
Occupational Therapy Session Note  Patient Details  Name: Stanley Jimenez MRN: [id number] Date of Birth: 08/22/1955  Today's Date: 07/06/2021 OT Individual Time: 0938-1829 OT Individual Time Calculation (min): 43 min    Short Term Goals: Week 2:  OT Short Term Goal 1 (Week 2): STG = LTG 2/2 ELOS  Skilled Therapeutic Interventions/Progress Updates:    Pt in bed to start session with his brothers present for education.  Had him work on LB dressing to donn his pants and shoes to start as he did not have any on.  He was able to donn his pull up pants with min guard assist sit to stand and then donned his socks and shoes with them already tied at supervision.  He was able to tie the string on his pants with two attempts but was not able to tie his shoes when it was attempted.  Therapist assisted with tying secondary to his brother's request to tie them tighter.  He then completed functional mobility from his room down to the tub room at min assist level without an assistive device.  Pt with multiple staggers to both the right and left with slight limping noted.  He would also point and hold the right side of his stomach.  When asked if he was having pain, he would shake his head but once we reached the tub room, he did not acknowledge it when asked.  Min assist for stepping over the edge of the tub with hand held assist and use of a grab bar.  He demonstrated decreased efficiency when stepping in secondary to stepping in with the LLE first and then the right and turning to the right instead of the left, in order to align with the seat and sit down.  Feet were also on top of each other since he did not slide the left before stepping in with the right.  Educated pt's brother's on correct technique to step in with the RLE first and then slide his feet to increase space for stepping in for the left.   Also discussed the need for him to sit and take his shower and dry off before transferring out.  He was able to  step back out of the tub with min hand held assist and ambulated back to the room at min assist level.  He transferred back to the bed with supervision after removing his shoes.  Discussed with family letting him try and do everything he can but to always be next to him with transfers and selfcare tasks.  Give simple cues and see if he can follow them and when making errors, give him time to see if he can correct it before you assist him, as long as it's safe to hold off.  Call button and phone in reach with safety alarm in place and family present as well.  All questions answered.    Therapy Documentation Precautions:  Precautions Precautions: Fall Precaution Comments: expressive > receptive aphasia, R inattention Restrictions Weight Bearing Restrictions: No  Pain: Pain Assessment Pain Scale: Faces Pain Score: 0-No pain Faces Pain Scale: Hurts a little bit Pain Type: Acute pain Pain Location: Abdomen Pain Orientation: Lower;Left Pain Descriptors / Indicators: Discomfort Pain Onset: With Activity Pain Intervention(s): Emotional support ADL: See Care Tool Section for some details of mobility and selfcare  Therapy/Group: Individual Therapy  Dayzha Pogosyan OTR/L 07/06/2021, 3:55 PM

## 2021-07-06 NOTE — Progress Notes (Signed)
ANTICOAGULATION CONSULT NOTE - Follow Up Consult  Pharmacy Consult for Warfarin Indication: CVA, DVT  No Known Allergies  Patient Measurements: Height: 5' 8.5" (174 cm) Weight: 56.5 kg (124 lb 9 oz)  Vital Signs: Temp: 97.4 F (36.3 C) (02/13 0311) Temp Source: Oral (02/13 0311) BP: 124/67 (02/13 0311) Pulse Rate: 99 (02/13 0311)  Labs: Recent Labs    07/04/21 0713 07/05/21 0642 07/06/21 0528  HGB  --  8.6* 8.3*  HCT  --  26.4* 26.4*  PLT  --  323 298  LABPROT 28.0* 25.1* 23.9*  INR 2.6* 2.3* 2.1*  CREATININE  --   --  1.01     CrCl cannot be calculated (Unknown ideal weight.).   Medications:  Scheduled:   heparin lock flush  500 Units Intracatheter Q30 days   lidocaine  1 patch Transdermal Q24H   megestrol  400 mg Oral Daily   nystatin  5 mL Oral QID   polyethylene glycol  17 g Oral Daily   senna-docusate  2 tablet Oral QHS   sodium chloride flush  10-40 mL Intracatheter Q12H   tamsulosin  0.4 mg Oral QHS   Warfarin - Pharmacist Dosing Inpatient   Does not apply q1600    Assessment: 66 yo M with new RIJ DVT 06/20/21 in the setting of R intracarotid artery PAD, liver cirrhosis, stroke, and esophageal cancer. No anticoagulation prior to admission. INR on admission was 1.3. Pharmacy consulted for warfarin and heparin.  Anticoagulation was held 1/31 with new onset of hematuria. Per MD, ok to resume anticoagulation on 2/5. Heparin was discontinued on 2/7, patient continued on warfarin. Pharmacy to dose warfarin.  INR down to 2.1 today (INR goal 2-3).  CBC stable.  No s/sx of bleeding noted. We will increase dose today. He will likely need around 3mg  daily.    Goal of Therapy:  INR 2-3 Monitor platelets by anticoagulation protocol: Yes   Plan:  Warfarin 4 mg PO tonight.  Daily INR, CBC.  Monitor for s/sx of bleeding.    Onnie Boer, PharmD, BCIDP, AAHIVP, CPP Infectious Disease Pharmacist 07/06/2021 9:59 AM

## 2021-07-06 NOTE — Progress Notes (Signed)
Physical Therapy Discharge Summary  Patient Details  Name: Stanley Jimenez MRN: [id number] Date of Birth: [birth year]  Today's Date: 07/06/2021 PT Individual Time: 9323-5573  PT Individual Time Calculation (min): 42 min     Patient has met 1 of 8 long term goals due to improved balance, increased strength, functional use of  right upper extremity and right lower extremity, and improved awareness.  Patient to discharge at an ambulatory level CGA/ Min Assist.   Patient's care partner is independent to provide the necessary physical and cognitive assistance at discharge.  Reasons goals not met: Pt has demonstrated functional decline over past week d/t spinal fractures 2/2 medical progression of comorbiities. Pt was on track to meet goals prior to decline. Pt is at level that is adequate for d/c as he is returning home with appropriate 24/7 assist from brothers/ family.   Recommendation:  Patient will benefit from ongoing skilled PT services in home health setting to continue to advance safe functional mobility, address ongoing impairments in strength, coordination, balance, activity tolerance, cognition, safety awareness, and to minimize fall risk.  Equipment: RW, shower chair, BSC/ 3-in-1  Reasons for discharge: change in medical status and discharge from hospital  Patient/family agrees with progress made and goals achieved: Yes  PT Discharge Precautions/Restrictions Precautions Precautions: Fall;Cervical;Other (comment) (Pt to wear soft collar prn for pain) Precaution Comments: expressive > receptive aphasia, R inattention Restrictions Weight Bearing Restrictions: Yes LUE Weight Bearing: Non weight bearing Other Position/Activity Restrictions: no resistance training to LUE Vital Signs   Pain Pain Assessment Pain Scale: Faces Pain Score: 0-No pain Faces Pain Scale: Hurts little more Pain Type: Acute pain;Chronic pain Pain Location: Back Pain Orientation: Mid;Lower Pain  Descriptors / Indicators: Aching;Discomfort Pain Onset: Unable to tell Pain Intervention(s): Medication (See eMAR);RN made aware;Repositioned Pain Interference Pain Interference Pain Effect on Sleep: 2. Occasionally Pain Interference with Therapy Activities: 3. Frequently Pain Interference with Day-to-Day Activities: 3. Frequently Vision/Perception  Vision - History Ability to See in Adequate Light: 0 Adequate (difficulty fully assessing due to global aphasia) Vision - Assessment Eye Alignment: Within Functional Limits Alignment/Gaze Preference: Within Defined Limits Perception Perception: Impaired Inattention/Neglect: Does not attend to right visual field;Does not attend to right side of body Praxis Praxis: Impaired Praxis Impairment Details: Motor planning;Ideomotor  Cognition Overall Cognitive Status: Impaired/Different from baseline Arousal/Alertness: Awake/alert Orientation Level: Oriented to person;Oriented to place;Oriented to time Year: Other (Comment) (unable to state when asked secondary to expressive aphasia) Month:  (unable to state when asked secondary to expressive aphasia) Day of Week: Other (Comment) (unable to state when asked secondary to expressive aphasia) Attention: Selective Sustained Attention: Impaired Sustained Attention Impairment: Functional basic;Verbal basic Selective Attention: Impaired Memory: Impaired Immediate Memory Recall:  (unable to state when asked secondary to expressive aphasia) Memory Recall Sock:  (unable to state when asked secondary to expressive aphasia) Memory Recall Blue:  (unable to state when asked secondary to expressive aphasia) Memory Recall Bed:  (unable to state when asked secondary to expressive aphasia) Awareness: Impaired Awareness Impairment: Intellectual impairment;Emergent impairment Problem Solving: Impaired Problem Solving Impairment: Functional basic Safety/Judgment: Impaired Comments: Difficulty fully assessing  2/2 global aphasia. Sensation Sensation Light Touch: Impaired Detail Light Touch Impaired Details: Impaired RUE;Impaired RLE Hot/Cold: Not tested Proprioception: Impaired by gross assessment Stereognosis: Impaired Detail Additional Comments: limited by R inattention to RUE, improved from eval; Diffulcty fully assessing 2/2 global aphasia. Coordination Gross Motor Movements are Fluid and Coordinated: No Fine Motor Movements are Fluid and Coordinated: No Coordination and  Movement Description: mild R sided hemiplegia Finger Nose Finger Test: unable to follow commands Heel Shin Test: able to follow commands but pt now lethargic from progression of multiple comorbidities 9 Hole Peg Test: R: 1 min 55 seconds; L: 1 min 3 seconds Motor  Motor Motor: Hemiplegia;Motor apraxia Motor - Discharge Observations: improving R hemi; continued R sided visual impairment vs inattention that affects ability to perform safe mobility  Mobility Bed Mobility Bed Mobility: Supine to Sit;Sit to Supine Supine to Sit: Supervision/Verbal cueing Transfers Transfers: Sit to Stand;Stand to Sit;Stand Pivot Transfers Sit to Stand: Contact Guard/Touching assist Stand to Sit: Contact Guard/Touching assist Stand Pivot Transfers: Contact Guard/Touching assist;Minimal Assistance - Patient > 75% Stand Pivot Transfer Details: Verbal cues for precautions/safety;Tactile cues for posture Transfer (Assistive device): None Locomotion  Gait Ambulation: Yes Gait Assistance: Contact Guard/Touching assist;Minimal Assistance - Patient > 75% Gait Distance (Feet): 200 Feet Assistive device: None Gait Assistance Details: Tactile cues for sequencing;Tactile cues for weight shifting;Verbal cues for precautions/safety;Verbal cues for safe use of DME/AE Gait Assistance Details: up to [place] required for balance and maneuvering obstacles on R side Gait Gait: Yes Gait Pattern: Impaired Gait Pattern: Narrow base of support;Decreased  stride length;Right genu recurvatum;Left genu recurvatum;Decreased trunk rotation;Trunk flexed Gait velocity: decreased Stairs / Additional Locomotion Stairs Assistance: Minimal Assistance - Patient > 75% Stair Management Technique: Two rails Number of Stairs: 4 Height of Stairs: 6 Pick up small object from the floor (from standing position) activity did not occur: Safety/medical concerns Wheelchair Mobility Wheelchair Mobility: No  Trunk/Postural Assessment  Cervical Assessment Cervical Assessment: Exceptions to [facility] (cervical flexion) Thoracic Assessment Thoracic Assessment: Exceptions to [facility] (rounded soulders) Lumbar Assessment Lumbar Assessment: Exceptions to [facility] (anterior pelvic tilt in standing) Postural Control Postural Control: Within Functional Limits  Balance Balance Balance Assessed: Yes Static Sitting Balance Static Sitting - Balance Support: Feet supported;No upper extremity supported Static Sitting - Level of Assistance: 5: Stand by assistance Dynamic Sitting Balance Dynamic Sitting - Balance Support: Feet supported;No upper extremity supported Dynamic Sitting - Level of Assistance: 5: Stand by assistance Static Standing Balance Static Standing - Balance Support: During functional activity;No upper extremity supported Static Standing - Level of Assistance: 5: Stand by assistance;4: Min assist (CGA) Dynamic Standing Balance Dynamic Standing - Balance Support: During functional activity;No upper extremity supported Dynamic Standing - Level of Assistance: 5: Stand by assistance;4: Min assist (CGA) Extremity Assessment  RUE Assessment RUE Assessment: Exceptions to [facility] General Strength Comments: unable to follow commands for MMT, mild hemiparesis and R inattention, moderately improved from eval and pt using dominantly for most ADL tasks LUE Assessment LUE Assessment: Within Functional Limits General Strength Comments: grossly WFL for ADL assessed; unable to follow  commands for MMT RLE Assessment RLE Assessment: Exceptions to [facility] General Strength Comments: functionally 4/5 prox to dist LLE Assessment LLE Assessment: Exceptions to [facility] General Strength Comments: functionally 4+/5 prox to dist   Pt's d/c completed based on recent notes/ performances and caretool entries d/t pt's recent decline in LOA required for mobility.   Patient supine in bed on entrance to room. Two brothers present for family education. Patient alert and agreeable to PT session but refusing to leave bed d/t fatigue.   Patient with no pain complaint throughout session, but does shift positioning d/t discomfort.   Therapeutic Activity: Bed Mobility: Patient performed supine <> sit with supervision. VC/ tc required for technique.  Education provided to pt's brothers re: expectations for progress, expectations for home health therapy and need  to advocate for strength program that pt can continue outside of therapy sessions. Also need to advocate for continued training in more neurological based activities including standing balance and ambulation. Brothers with questions re: car transfer, stairs and provided with information re: proper technique for pt and how to guard/ assist. Related pt's most recent performance and potential for slightly more assistance d/t functional decline. Promoted need to advocate for pt and any ideas or research brothers may find and want to know about at any MD appointments. Indicated pt's MD that will follow him for progress with stroke.Should hear re: appointment soon. May be scheduled for a few weeks later. Addressed all questions from family  and related need to contact staff or MD for add'l questions or utilize services available from [facility] agency.   Pt's personal RW adjusted for pt's height and education on adjustment to feet of walker to better promote smooth gliding on home flooring. Reviewed how to set up [facility] and need to have over toilet at this time.    Patient supine  in bed at end of session with brakes locked, ed alarm set, and all needs within reach.    Alger Simons 07/06/2021, 5:49 PM

## 2021-07-07 ENCOUNTER — Ambulatory Visit
Admit: 2021-07-07 | Discharge: 2021-07-07 | Disposition: A | Discharge status: Home / Self Care | Payer: Medicaid Other | Attending: Radiation Oncology | Admitting: Radiation Oncology

## 2021-07-07 ENCOUNTER — Telehealth: Payer: Self-pay | Admitting: Radiation Therapy

## 2021-07-07 ENCOUNTER — Telehealth: Payer: Self-pay

## 2021-07-07 ENCOUNTER — Encounter (HOSPITAL_COMMUNITY): Payer: Self-pay | Admitting: Hematology

## 2021-07-07 ENCOUNTER — Other Ambulatory Visit: Payer: Self-pay | Admitting: Radiation Therapy

## 2021-07-07 ENCOUNTER — Other Ambulatory Visit (HOSPITAL_COMMUNITY): Payer: Self-pay

## 2021-07-07 DIAGNOSIS — C7951 Secondary malignant neoplasm of bone: Secondary | ICD-10-CM

## 2021-07-07 DIAGNOSIS — I63312 Cerebral infarction due to thrombosis of left middle cerebral artery: Secondary | ICD-10-CM

## 2021-07-07 LAB — CBC
HCT: 25.8 % — ABNORMAL LOW (ref 39.0–52.0)
Hemoglobin: 8.6 g/dL — ABNORMAL LOW (ref 13.0–17.0)
MCH: 27.3 pg (ref 26.0–34.0)
MCHC: 33.3 g/dL (ref 30.0–36.0)
MCV: 81.9 fL (ref 80.0–100.0)
Platelets: 271 K/uL (ref 150–400)
RBC: 3.15 MIL/uL — ABNORMAL LOW (ref 4.22–5.81)
RDW: 20.8 % — ABNORMAL HIGH (ref 11.5–15.5)
WBC: 13.6 K/uL — ABNORMAL HIGH (ref 4.0–10.5)
nRBC: 0 % (ref 0.0–0.2)

## 2021-07-07 LAB — PROTIME-INR
INR: 2.3 — ABNORMAL HIGH (ref 0.8–1.2)
Prothrombin Time: 25.5 s — ABNORMAL HIGH (ref 11.4–15.2)

## 2021-07-07 MED ORDER — LIDOCAINE 5 % EX PTCH
1.0000 | MEDICATED_PATCH | CUTANEOUS | 0 refills | Status: DC
  Filled 2021-07-07: qty 30, 30d supply, fill #0

## 2021-07-07 MED ORDER — AMLODIPINE BESYLATE 10 MG PO TABS
10.0000 mg | ORAL_TABLET | Freq: Every day | ORAL | 0 refills | Status: DC
  Filled 2021-07-07: qty 30, 30d supply, fill #0

## 2021-07-07 MED ORDER — WARFARIN SODIUM 2 MG PO TABS
ORAL_TABLET | ORAL | 0 refills | Status: DC
  Filled 2021-07-07: qty 45, 30d supply, fill #0

## 2021-07-07 MED ORDER — TAMSULOSIN HCL 0.4 MG PO CAPS
0.4000 mg | ORAL_CAPSULE | Freq: Every day | ORAL | 0 refills | Status: DC
  Filled 2021-07-07: qty 30, 30d supply, fill #0

## 2021-07-07 MED ORDER — POLYETHYLENE GLYCOL 3350 17 G PO PACK: 17.0000 g | PACK | Freq: Every day | ORAL | 0 refills | Status: DC

## 2021-07-07 MED ORDER — MEGESTROL ACETATE 40 MG/ML PO SUSP
400.0000 mg | Freq: Every day | ORAL | 0 refills | Status: AC
Start: 2021-07-07 — End: ?
  Filled 2021-07-07: qty 240, 24d supply, fill #0

## 2021-07-07 MED ORDER — ACETAMINOPHEN 325 MG PO TABS: 650.0000 mg | ORAL_TABLET | ORAL | Status: AC | PRN

## 2021-07-07 MED ORDER — WARFARIN SODIUM 3 MG PO TABS: 3.0000 mg | ORAL_TABLET | Freq: Every day | ORAL | Status: DC

## 2021-07-07 NOTE — Progress Notes (Signed)
Patient ID: [name], male   DOB: 1956/04/14, 66 y.o.   MRN: [id number]    Progress Note from the Palliative Medicine Team at [facility]   Patient Name: Stanley Jimenez        Date: 07/07/2021 DOB: Oct 19, 1955  Age: 66 y.o. 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  Presented on 06/20/2021 with acute onset of aphasia.  Cranial CT scan negative for acute changes.   Cranial CT scan negative for acute changes.  CT angiogram head and neck no emergent large vessel occlusion in the head.  High-grade approximately 80% in the proximal right ICA, severe stenosis proximal left V1 segment multifocal moderate to severe stenosis of the proximal left V2 segment.  Extensive osseous metastasis.  MRI of the brain significant for multiple bilateral acute infarcts.  Patient currently in [place] for rehabilitation with plan to discharge home with his brother.  Patient remains aphasic and dependent for all activities of daily living.  This NP visited patient at the bedside as a follow up for palliative medicine needs and emotional support.  I met at the patient's bedside along with his brother [name], for continued conversation regarding diagnosis, prognosis, goals of care, end-of-life wishes, disposition and options.  Patient and family face ongoing treatment option decisions, advanced directive decisions and anticipatory care needs.  Education offered on the significance of patient's multiple comorbidities and the likely associated long-term poor prognosis  specific to his metastatic cancer diagnosis.   Education offered on hospice benefit; hospice philosophy and eligibility.  Eduction offered to patient and his brother the importance of continued conversation with each other  and the medical providers regarding overall plan of care and treatment options,  ensuring decisions are within the context of the patients values and GOCs.  Provided MOST for and reviewed, recommended OP palliative  care services at the home.   Questions and concerns addressed   Discussed with bedside RN  Total time spent on the unit was 60 minutes  Greater than 50% of the time was spent in counseling and coordination of care  Wadie Lessen NP  Palliative Medicine Team Team Phone # [phone] Pager [phone]

## 2021-07-07 NOTE — Telephone Encounter (Signed)
[name] at [facility] requesting a visit with Dr. Delton Coombes to discuss palliative radiation to the cervical spine. Pt was reviewed in the brain and spine conference on 2/13 and palliative XRT recommended. Due to his discharge plans for 2/14 and the patient's preference to be treated closer to home, a referral has been entered for him to see Dr. Delton Coombes instead of Dr. Tammi Klippel at this time.   The next available appt is 2/21, I will call the floor nurse to ensure Stanley Jimenez is updated on the appointment change.   Mont Dutton R.T.(R)(T) Radiation Special Procedures Navigator

## 2021-07-07 NOTE — Telephone Encounter (Signed)
Dan called to report Stanley Jimenez INR was 2.3 (they are trying to keep him within the range of 2.0-3.0. He is currently on 3mg  of coumadin daily. [place] nurse is going to be going to his home on Friday to draw next INR.

## 2021-07-07 NOTE — Progress Notes (Signed)
ANTICOAGULATION CONSULT NOTE - Follow Up Consult  Pharmacy Consult for Warfarin Indication: CVA, DVT  No Known Allergies  Patient Measurements: Height: 5' 8.5" (174 cm) Weight: 56.5 kg (124 lb 9 oz)  Vital Signs: Temp: 98.1 F (36.7 C) (02/14 0430) BP: 130/76 (02/14 0430) Pulse Rate: 97 (02/14 0430)  Labs: Recent Labs    07/05/21 0642 07/06/21 0528 07/07/21 0541  HGB 8.6* 8.3* 8.6*  HCT 26.4* 26.4* 25.8*  PLT 323 298 271  LABPROT 25.1* 23.9* 25.5*  INR 2.3* 2.1* 2.3*  CREATININE  --  1.01  --      CrCl cannot be calculated (Unknown ideal weight.).   Medications:  Scheduled:   heparin lock flush  500 Units Intracatheter Q30 days   lidocaine  1 patch Transdermal Q24H   megestrol  400 mg Oral Daily   nystatin  5 mL Oral QID   polyethylene glycol  17 g Oral Daily   senna-docusate  2 tablet Oral QHS   sodium chloride flush  10-40 mL Intracatheter Q12H   tamsulosin  0.4 mg Oral QHS   Warfarin - Pharmacist Dosing Inpatient   Does not apply q1600    Assessment: 66 yo M with new RIJ DVT 06/20/21 in the setting of R intracarotid artery PAD, liver cirrhosis, stroke, and esophageal cancer. No anticoagulation prior to admission. INR on admission was 1.3. Pharmacy consulted for warfarin and heparin.  Anticoagulation was held 1/31 with new onset of hematuria. Per MD, ok to resume anticoagulation on 2/5. Heparin was discontinued on 2/7, patient continued on warfarin. Pharmacy to dose warfarin.  INR down to 2.3. today (INR goal 2-3).  CBC stable.  No s/sx of bleeding noted. Plan to dc home with INR check on Friday    Goal of Therapy:  INR 2-3 Monitor platelets by anticoagulation protocol: Yes   Plan:  Warfarin 3mg  PO qday Daily INR, CBC.  Monitor for s/sx of bleeding.    Onnie Boer, PharmD, BCIDP, AAHIVP, CPP Infectious Disease Pharmacist 07/07/2021 12:18 PM

## 2021-07-07 NOTE — Progress Notes (Signed)
°                                                       PROGRESS NOTE ° ° °Subjective/Complaints: ° °In soft collar for comfort , difficult to assess pain due to aphasia ° °ROS-cannot assess due to aphasia ° °Objective: °  °No results found. °Recent Labs  °  07/06/21 °0528 07/07/21 °0541  °WBC 14.3* 13.6*  °HGB 8.3* 8.6*  °HCT 26.4* 25.8*  °PLT 298 271  ° ° ° °Recent Labs  °  07/06/21 °0528  °NA 135  °K 4.1  °CL 102  °CO2 23  °GLUCOSE 106*  °BUN 24*  °CREATININE 1.01  °CALCIUM 12.0*  ° ° ° ° °Intake/Output Summary (Last 24 hours) at 07/07/2021 0850 °Last data filed at 07/06/2021 1750 °Gross per 24 hour  °Intake 180 ml  °Output --  °Net 180 ml  ° °  ° °  ° ° °Physical Exam: °Vital Signs °Blood pressure 130/76, pulse 97, temperature 98.1 °F (36.7 °C), resp. rate 16, SpO2 100 %. ° ° °General: No acute distress °Mood and affect are appropriate °Heart: Regular rate and rhythm no rubs murmurs or extra sounds °Lungs: Clear to auscultation, breathing unlabored, no rales or wheezes °Abdomen: Positive bowel sounds, soft nontender to palpation, nondistended °Extremities: No clubbing, cyanosis, or edema ° ° °Neurologic: Cranial nerves II through XII intact, motor strength is 5/5 in L and 4/5 Right deltoid, bicep, tricep, grip, hip flexor, knee extensors, ankle dorsiflexor and plantar flexor °Sensory exam cannot assess due to aphasia  °Aphasia Exp> receptive, can follow simple commands point to white board when asked his name  °Musculoskeletal: No pain to palp C 2 spinous process or along cervical paraspinal area (monitored for facial grimacing during palpation)  ° ° ° °Assessment/Plan: °1. 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  °Stable for D/C today °F/u PCP in 3-4 weeks °F/u PM&R 2 weeks °See D/C summary °See D/C instructions  ° °Care Tool: ° °Bathing °   °Body parts bathed by patient: Right arm, Left arm, Chest, Abdomen, Front perineal area, Left upper leg, Right upper leg, Buttocks, Right lower leg, Left lower leg, Face  °  Body parts bathed by helper: Buttocks, Front perineal area °Body parts n/a: Right arm, Left arm, Chest, Abdomen, Right upper leg, Left upper leg, Right lower leg, Left lower leg, Front perineal area, Buttocks °  °Bathing assist Assist Level: Contact Guard/Touching assist °  °  °Upper Body Dressing/Undressing °Upper body dressing   °What is the patient wearing?: Pull over shirt °   °Upper body assist Assist Level: Minimal Assistance - Patient > 75% °   °Lower Body Dressing/Undressing °Lower body dressing ° ° °   °What is the patient wearing?: Pants ° °  ° °Lower body assist Assist for lower body dressing: Minimal Assistance - Patient > 75% °   ° °Toileting °Toileting    °Toileting assist Assist for toileting: Contact Guard/Touching assist °  °  °Transfers °Chair/bed transfer ° °Transfers assist °   ° °Chair/bed transfer assist level: Minimal Assistance - Patient > 75% °  °  °Locomotion °Ambulation ° ° °Ambulation assist ° °   ° °Assist level: Minimal Assistance - Patient > 75% °Assistive device: No Device °Max distance: 200'  ° °Walk 10 feet activity ° ° °Assist °   ° °  Assist     Assist level: Contact Guard/Touching assist Assistive device: No Device   Walk 50 feet activity   Assist    Assist level: Minimal Assistance - Patient > 75% Assistive device: No Device    Walk 150 feet activity   Assist    Assist level: Minimal Assistance - Patient > 75% Assistive device: No Device    Walk 10 feet on uneven surface  activity   Assist Walk 10 feet on uneven surfaces activity did not occur: Safety/medical concerns   Assist level: Minimal Assistance - Patient > 75% Assistive device: Walker-rolling   Wheelchair     Assist Is the patient using a wheelchair?: No   Wheelchair activity did not occur: N/A         Wheelchair 50 feet with 2 turns activity    Assist    Wheelchair 50 feet with 2 turns activity did not occur: N/A       Wheelchair 150 feet activity     Assist  Wheelchair  150 feet activity did not occur: N/A       Blood pressure 130/76, pulse 97, temperature 98.1 F (36.7 C), resp. rate 16, SpO2 100 %.  Medical Problem List and Plan: 1. Functional deficits secondary to multiple bilateral acute infarcts largest located left frontal and occipital lobe likely secondary to embolic disease 8/00/34  versus hypercoagulability from esophageal cancer             -patient may shower             -ELOS/Goals: 2/14           C spine without instability OK for full therapy per NS, can switch to soft collar for comfort / pain  since  pt finds hard collar too restrictive or uncomfortable    Discussed C spine films with NS Dr Marcello Moores- C spine stable, has both degenerative changes as well as chronic appearing metastatic lesions, no stabilization surgery advised , palliative rad tx for symptoms of pain which have abated over the last several days after bracing    2.  Antithrombotics: -DVT/anticoagulation:  Pharmaceutical: Coumadin INR 3.3 no signs of bleeding , pharmacy to adjust per protocol   2/12- INR 2.3- con't regimen per pharmacy             -antiplatelet therapy: none  3. Pain Management: Tylenol as needed. Lidocaine patch added Difficult to assess due to severe exp aphasia but on exam most consistent wit neck pain , given hx of osseous mets will check C spine xray , last xray was 2013 pre cancer, showed degenerative changes with spondylolisthesis, degenerative most pronounced at C 3-4 , has tenderness over C2 spinous process corresponding with met   2/12- denies pain today when asked 2 different ways?- con't regimen 4. Mood: Provide emotional support             -antipsychotic agents: N/A 5. Neuropsych: This patient is not capable of making decisions on his own behalf. 6. Skin/Wound Care: Routine skin checks 7. Hyponatremia: repeat Na tomorrow.  8.  Acute thrombus right IJ.   2/14- INR 2.3- off heparin  9.  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  Not a candidate for intervention due to pre-existing metastatic cancer 10.  Thrombocytopenia/acute on chronic anemia/leukocytosis.  Follow-up CBC 11.  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 Need for palliative rad  tx to C spine as that pain has improved with immobilization Follow-up chemotherapy as outpatient °12.  Prostate cancer with radioactive seed implant.  Follow-up outpatient.  Continue Flomax 0.4 mg daily °13.  Hypertension.  Well controlled. Monitor with increased mobility.  Home medication Norvasc 10 mg daily prior to admission.  Resume as needed.  °Vitals:  ° 07/06/21 2130 07/07/21 0430  °BP: 109/67 130/76  °Pulse: 89 97  °Resp: 17 16  °Temp: 98.5 °F (36.9 °C) 98.1 °F (36.7 °C)  °SpO2: 100% 100%  ° 2/14- BP better controlled- con't regimen °14.  Paroxysmal SVT.  Tachycardic. Continue Coreg 3.125mg BID.  °15. Prediabetes: Hgb A1c 6.3 on 06/21/21- prior CBGs well controlled, not monitoring in rehab.  °16. Thrush: start nystatin °17. Reflux of food:  KUB shows no obstruction, discussed with patient °18. Decreased appetite: start megace °19. Kidney stones: IVF overnight ° 20. Constipation ° 2/11- give sorbitol for BM- last BM at least 3 days ago.  ° 2/12- LBM this AM- not incontinent per chart ° °LOS: °13 days °A FACE TO FACE EVALUATION WAS PERFORMED ° °Andrew E Kirsteins °07/07/2021, 8:50 AM  ° ° ° °

## 2021-07-07 NOTE — Progress Notes (Signed)
Patient didn't sleep well through the night, most of the time stayed awake. No signs of distress.

## 2021-07-08 ENCOUNTER — Telehealth (HOSPITAL_COMMUNITY): Payer: Self-pay | Admitting: *Deleted

## 2021-07-08 NOTE — Telephone Encounter (Signed)
Stanley Jimenez's brother Stanley Jimenez was contacted by telephone to verify understanding of discharge instructions status post their most recent discharge from the hospital on the date:  07/07/21.  Inpatient discharge AVS was re-reviewed with him, along with cancer center appointments.  Verification of understanding for oncology specific follow-up was validated using the Teach Back method.  Currently has Home Health services in place and is progressing well, per brother.  Transportation to appointments were confirmed for the patient as being self/caregiver.  Stanley Jimenez questions were addressed to their satisfaction upon completion of this post discharge follow-up call for outpatient oncology.

## 2021-07-09 ENCOUNTER — Telehealth: Payer: Self-pay

## 2021-07-09 NOTE — Telephone Encounter (Signed)
Transitional Care call--Brother [name]    Are you/is patient experiencing any problems since coming home? No Are there any questions regarding any aspect of care? No Are there any questions regarding medications administration/dosing? No Are meds being taken as prescribed? Yes Patient should review meds with caller to confirm Have there been any falls? No Has Home Health been to the house and/or have they contacted you? [facility] ? If not, have you tried to contact them? No contact yetCan we help you contact them? I will call [facility] to see if they got the referral Are bowels and bladder emptying properly? Yes Are there any unexpected incontinence issues? No If applicable, is patient following bowel/bladder programs? Any fevers, problems with breathing, unexpected pain? No Are there any skin problems or new areas of breakdown? No Has the patient/family member arranged specialty MD follow up (ie cardiology/neurology/renal/surgical/etc)? Yes Can we help arrange? Does the patient need any other services or support that we can help arrange? No Are caregivers following through as expected in assisting the patient? Yes Has the patient quit smoking, drinking alcohol, or using drugs as recommended? Yes  Appointment time 10:20 am, arrive time 10:00 am on 07/21/21 with [place] [address]

## 2021-07-09 NOTE — Telephone Encounter (Signed)
Transition Care Management Unsuccessful Follow-up Telephone Call  Date of discharge and from where:  07/07/2021 [facility]  Attempts:  1st Attempt  Reason for unsuccessful TCM follow-up call:  Left voice message-Brother Stanley Jimenez

## 2021-07-10 ENCOUNTER — Other Ambulatory Visit: Payer: Self-pay | Admitting: Family Medicine

## 2021-07-10 ENCOUNTER — Telehealth: Payer: Self-pay | Admitting: Family Medicine

## 2021-07-10 MED ORDER — OXYCODONE-ACETAMINOPHEN 10-325 MG PO TABS: 1.0000 | ORAL_TABLET | Freq: Four times a day (QID) | ORAL | 0 refills | Status: AC | PRN

## 2021-07-10 NOTE — Telephone Encounter (Signed)
Called in on patient behalf in regard to hospital visit on 2/14   States that patient is in pain and hospital recommenced just tylenol for the pain , but the tylenol is not helping.   Wants to know if patient can have anything else for pain other that the tylenol.  Brother would like a call back in regards.

## 2021-07-10 NOTE — Telephone Encounter (Signed)
Pts brother is calling back

## 2021-07-10 NOTE — Telephone Encounter (Signed)
Patient aware.

## 2021-07-10 NOTE — Telephone Encounter (Signed)
Brother states Stanley Jimenez is having severe pain and tylenol is not helping. Was diagnosed with prostate cancer and needs something stronger than tylenol. [phone]

## 2021-07-10 NOTE — Progress Notes (Signed)
5 days of percocet prescribed has onc appt on 07/14/2021

## 2021-07-13 ENCOUNTER — Other Ambulatory Visit (HOSPITAL_COMMUNITY): Payer: Self-pay | Admitting: *Deleted

## 2021-07-14 ENCOUNTER — Inpatient Hospital Stay (HOSPITAL_COMMUNITY): Payer: Medicaid Other | Attending: Hematology | Admitting: Hematology

## 2021-07-14 ENCOUNTER — Other Ambulatory Visit (HOSPITAL_COMMUNITY): Payer: Medicaid Other

## 2021-07-15 ENCOUNTER — Telehealth: Payer: Self-pay | Admitting: Family Medicine

## 2021-07-15 ENCOUNTER — Other Ambulatory Visit (HOSPITAL_COMMUNITY): Payer: Self-pay

## 2021-07-15 NOTE — Telephone Encounter (Signed)
Called Stanley Jimenez on number provided to see what she is needing no answer left vm

## 2021-07-15 NOTE — Telephone Encounter (Signed)
Please call Stanley Jimenez they need verbal orders and RX sent in

## 2021-07-15 NOTE — Telephone Encounter (Signed)
Can the  pt be taken off Flomax, at bedtime, this is causing the pt to urinate more .  Also provided number to [facility] for advise

## 2021-07-16 ENCOUNTER — Other Ambulatory Visit: Payer: Self-pay

## 2021-07-16 ENCOUNTER — Encounter (HOSPITAL_COMMUNITY): Payer: Self-pay

## 2021-07-16 ENCOUNTER — Telehealth: Payer: Self-pay

## 2021-07-16 DIAGNOSIS — Z0000 Encounter for general adult medical examination without abnormal findings: Secondary | ICD-10-CM

## 2021-07-16 DIAGNOSIS — I639 Cerebral infarction, unspecified: Secondary | ICD-10-CM

## 2021-07-16 DIAGNOSIS — I700 Atherosclerosis of aorta: Secondary | ICD-10-CM

## 2021-07-16 DIAGNOSIS — Z9229 Personal history of other drug therapy: Secondary | ICD-10-CM

## 2021-07-16 NOTE — Progress Notes (Unsigned)
Patient brother " [name] " called by scheduling and appointment made for Dr. Delton Coombes on February 28th at 1 pm. Patient missed last appointment on February 21st.

## 2021-07-16 NOTE — Telephone Encounter (Signed)
Nurse from [facility] said [name] INR is 8.0 and he is taking 2mg  1.5 daily. Nurse instructed not to take anymore until they heard from our office. [phone]

## 2021-07-16 NOTE — Telephone Encounter (Signed)
TOC call done by another office

## 2021-07-16 NOTE — Telephone Encounter (Signed)
Appt scheduled

## 2021-07-16 NOTE — Telephone Encounter (Signed)
[name] spoke with Stanley Jimenez

## 2021-07-16 NOTE — Telephone Encounter (Signed)
Will need a hospital follow up if able to come in office  (not TOC- was already done by another office) Please schedule

## 2021-07-17 ENCOUNTER — Ambulatory Visit (INDEPENDENT_AMBULATORY_CARE_PROVIDER_SITE_OTHER): Payer: Medicaid Other | Admitting: Family Medicine

## 2021-07-17 ENCOUNTER — Other Ambulatory Visit: Payer: Self-pay

## 2021-07-17 ENCOUNTER — Encounter: Payer: Self-pay | Admitting: Family Medicine

## 2021-07-17 ENCOUNTER — Telehealth: Payer: Self-pay | Admitting: Family Medicine

## 2021-07-17 VITALS — BP 140/80 | HR 90 | Temp 98.4°F | Resp 15

## 2021-07-17 DIAGNOSIS — Z09 Encounter for follow-up examination after completed treatment for conditions other than malignant neoplasm: Secondary | ICD-10-CM | POA: Diagnosis not present

## 2021-07-17 DIAGNOSIS — C155 Malignant neoplasm of lower third of esophagus: Secondary | ICD-10-CM | POA: Diagnosis not present

## 2021-07-17 DIAGNOSIS — I639 Cerebral infarction, unspecified: Secondary | ICD-10-CM

## 2021-07-17 MED ORDER — TAMSULOSIN HCL 0.4 MG PO CAPS: 0.4000 mg | ORAL_CAPSULE | Freq: Every day | ORAL | 0 refills | Status: AC

## 2021-07-17 MED ORDER — DOCUSATE SODIUM 50 MG/5ML PO LIQD: ORAL | 3 refills | Status: DC

## 2021-07-17 MED ORDER — DOCUSATE SODIUM 50 MG/5ML PO LIQD: ORAL | 3 refills | Status: AC

## 2021-07-17 MED ORDER — DOCUSATE SODIUM 100 MG PO CAPS: 100.0000 mg | ORAL_CAPSULE | Freq: Two times a day (BID) | ORAL | 3 refills | Status: DC

## 2021-07-17 MED ORDER — POLYETHYLENE GLYCOL 3350 17 GM/SCOOP PO POWD: 17.0000 g | Freq: Every day | ORAL | 1 refills | Status: AC

## 2021-07-17 MED ORDER — POLYETHYLENE GLYCOL 3350 17 GM/SCOOP PO POWD: 17.0000 g | Freq: Every day | ORAL | 1 refills | Status: DC

## 2021-07-17 MED ORDER — MEGESTROL ACETATE 400 MG/10ML PO SUSP: ORAL | 2 refills | Status: DC

## 2021-07-17 MED ORDER — MEGESTROL ACETATE 400 MG/10ML PO SUSP: ORAL | 2 refills | Status: AC

## 2021-07-17 MED ORDER — AMLODIPINE BESYLATE 10 MG PO TABS: 10.0000 mg | ORAL_TABLET | Freq: Every day | ORAL | 0 refills | Status: AC

## 2021-07-17 NOTE — Telephone Encounter (Signed)
Verbal ok given that she will be attending physician

## 2021-07-17 NOTE — Patient Instructions (Addendum)
FR/u IN 4 MONTHS  nURSE PLEASE ORDER SUCTION FOR CLEANING MOUTH  TRANSFER SCRIPTS TO [facility]    NEW ARE GLYCOLAX, MEGACE AND COLACE   WE ARE HERE FOR YOU

## 2021-07-17 NOTE — Telephone Encounter (Signed)
Cassandra with [facility] called in on patient behalf  Wants to know if Provider will be attending physician for hospice services.   Call back  [place] ext [phone]

## 2021-07-21 ENCOUNTER — Ambulatory Visit: Payer: Medicaid Other | Admitting: Cardiology

## 2021-07-21 ENCOUNTER — Telehealth: Payer: Self-pay | Admitting: Family Medicine

## 2021-07-21 ENCOUNTER — Other Ambulatory Visit (HOSPITAL_COMMUNITY): Payer: Medicaid Other

## 2021-07-21 ENCOUNTER — Ambulatory Visit (HOSPITAL_COMMUNITY): Payer: Medicaid Other | Admitting: Hematology

## 2021-07-21 ENCOUNTER — Encounter: Payer: Medicaid Other | Attending: Registered Nurse | Admitting: Registered Nurse

## 2021-07-21 ENCOUNTER — Inpatient Hospital Stay (HOSPITAL_COMMUNITY): Payer: Medicaid Other | Admitting: Hematology

## 2021-07-21 NOTE — Telephone Encounter (Signed)
I left a voice message with his brother [name] to call back. I would like to know how many times per day Stanley Jimenez is taking pain medication so his script can be sent in accurately, pls letme know iof he calls back

## 2021-07-22 NOTE — Telephone Encounter (Signed)
Neema passed away a few days ago per his brother  ?

## 2021-07-22 DEATH — deceased

## 2021-07-28 ENCOUNTER — Ambulatory Visit: Payer: Medicaid Other | Admitting: Family Medicine

## 2021-07-30 ENCOUNTER — Encounter: Payer: Self-pay | Admitting: Family Medicine

## 2021-07-30 DIAGNOSIS — Z09 Encounter for follow-up examination after completed treatment for conditions other than malignant neoplasm: Secondary | ICD-10-CM | POA: Insufficient documentation

## 2021-07-30 NOTE — Assessment & Plan Note (Signed)
Decreased mobility and incapable of independently carrying out ADL's, supportive care for patient and family ?

## 2021-07-30 NOTE — Assessment & Plan Note (Signed)
Patient in for follow up of recent hospitalization. Discharge summary, and laboratory and radiology data are reviewed, and any questions or concerns  are discussed. Specific issues requiring follow up are specifically addressed.  

## 2021-07-30 NOTE — Assessment & Plan Note (Signed)
Extensive disease with marked deterioration in  Health, palliative care only, no future oncology appts ?

## 2021-07-30 NOTE — Progress Notes (Signed)
° °  Stanley Jimenez     MRN: [id number]      DOB: 1956-01-15   HPI Mr. Sones is here for follow up of hospitalization from 2/1 to 07/07/2021 with diagnosis of embolic cerebral infarction, and metastatic squamous cell tumor stage 4B of lower third of esophagus. He received in patient rehab and has been referred for out pt follow up with Neurology and Oncology and noted that there was improvement in hislevel of functioning over the course of his  hospitalization He is brought to the visit by 2 brothers who are caring for him Overall very poor level of function and now in hospice care, comfort only, no intention of oncology follow up or further intervention ROS Per family Denies recent fever or chills.ear pain or sore throat. Denies chest congestion, productive cough or wheezing. Denies  leg swelling C/o constipation and hard stool Wheelchair dependent. Denies , seizures, Denies skin break down or rash.   PE  BP 140/80    Pulse 90    Temp 98.4 F (36.9 C) (Temporal)    Resp 15    SpO2 96%   Cachectic, ill appearing male, expressive aphasia,  bitemporal wasting.  HEENT: No facial asymmetry, EOMI,       Chest: Clear to auscultation bilaterally.  CVS: S1, S2 no murmurs, no S3.Regular rate.  ABD: Soft non tender.   Ext: No edema  MS: decreased  ROM spine, shoulders, hips and knees.  Skin: Intact, no ulcerations or rash noted.  [facility] discharge follow-up Patient in for follow up of recent hospitalization. Discharge summary, and laboratory and radiology data are reviewed, and any questions or concerns  are discussed. Specific issues requiring follow up are specifically addressed.   Malignant tumor of lower third of esophagus (HCC) Extensive disease with marked deterioration in  Health, palliative care only, no future oncology appts  Ischemic stroke of frontal lobe (HCC) Decreased mobility and incapable of independently carrying out ADL's, supportive care  for patient and family

## 2021-09-04 ENCOUNTER — Ambulatory Visit: Payer: Medicaid Other | Admitting: Cardiovascular Disease

## 2021-11-17 ENCOUNTER — Ambulatory Visit: Payer: Medicaid Other | Admitting: Family Medicine

## 2022-10-26 IMAGING — MR MR HEAD W/O CM
9 of 11 series · 38 of 48 positions shown · IV contrast (Contrast agent)
Comparison: 05/04/2021

CLINICAL DATA: Acute neurologic deficit

EXAM:
MRI HEAD WITHOUT CONTRAST
TECHNIQUE: Multiplanar, multiecho pulse sequences of the brain and surrounding
structures were obtained without intravenous contrast.

[Series 5: DWI · axial · 3.0mm · 0.88mm/px · z∈[-81,+55]mm · 9 of 100 slices shown (1 of 4)]
[im 1/100]
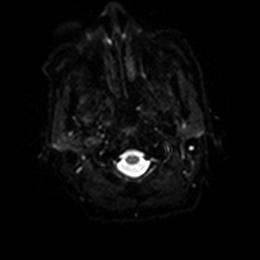
[im 13/100]
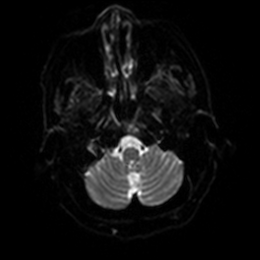
[im 25/100]
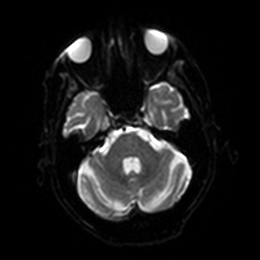
[im 38/100]
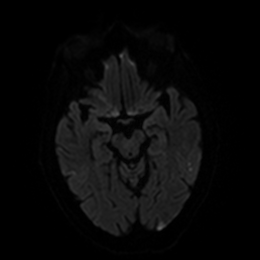
[im 50/100]
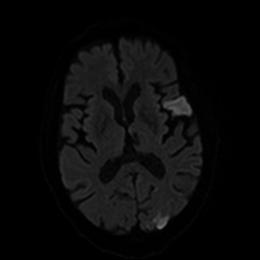
[im 62/100]
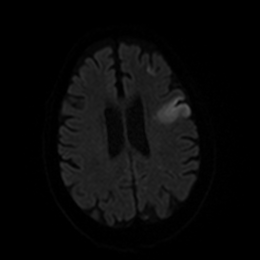
[im 75/100]
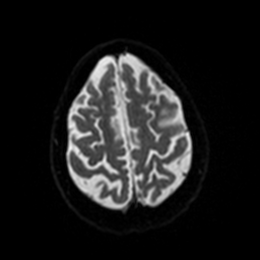
[im 87/100]
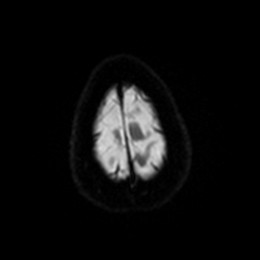
[im 100/100]
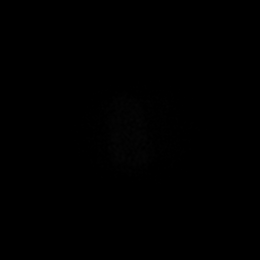

[Series 6: DWI · axial · 3.0mm · 0.88mm/px · z∈[-81,+55]mm · 4 of 50 slices shown (2 of 4)]
[im 1/50]
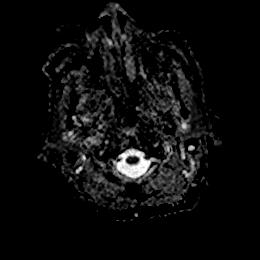
[im 17/50]
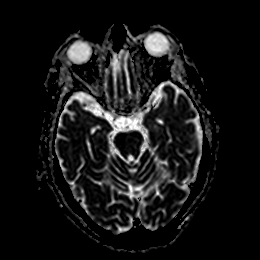
[im 33/50]
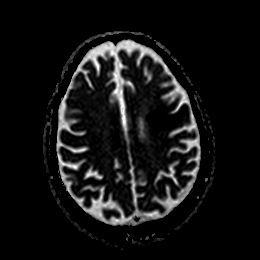
[im 50/50]
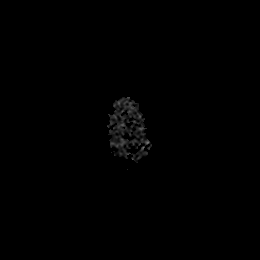

[Series 7: DWI · coronal · 4.0mm · 0.88mm/px · 6 of 66 slices shown (3 of 4)]
[im 1/66]
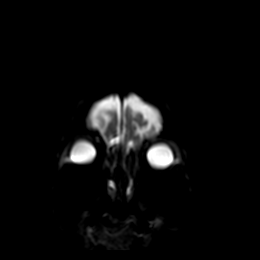
[im 14/66]
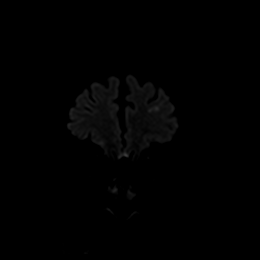
[im 27/66]
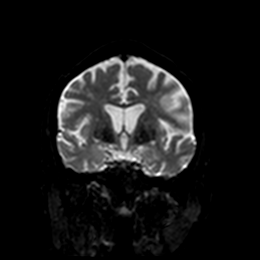
[im 40/66]
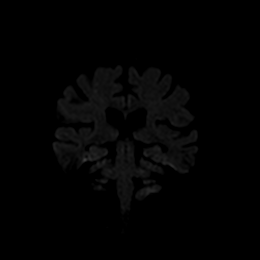
[im 53/66]
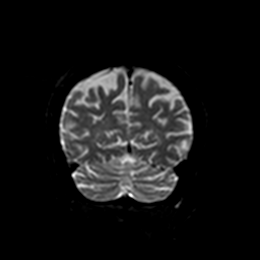
[im 66/66]
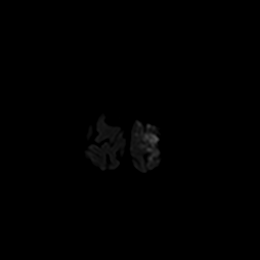

[Series 8: DWI · coronal · 4.0mm · 0.88mm/px · 3 of 33 slices shown (4 of 4)]
[im 1/33]
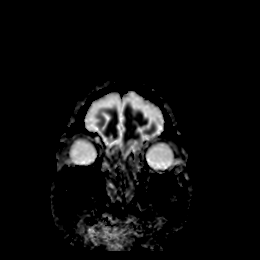
[im 17/33]
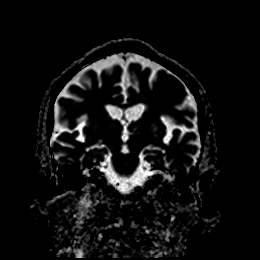
[im 33/33]
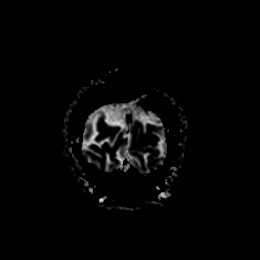

[Series 10: T2 · axial · 5.0mm · 0.72mm/px · z∈[-76,+58]mm · 2 of 25 slices shown]
[im 1/25]
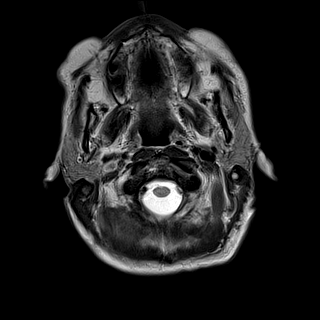
[im 25/25]
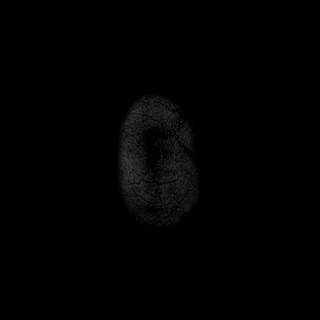

[Series 12: T1 · sagittal · 5.0mm · 0.78mm/px · 2 of 23 slices shown]
[im 1/23]
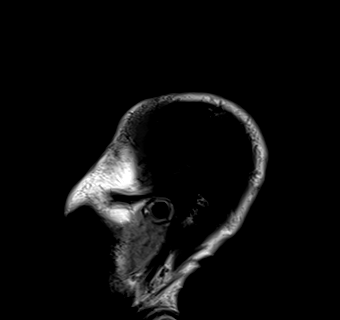
[im 23/23]
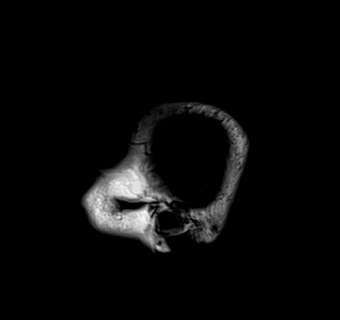

[Series 13: FLAIR · axial · 5.0mm · 0.45mm/px · z∈[-72,+62]mm · 2 of 25 slices shown]
[im 1/25]
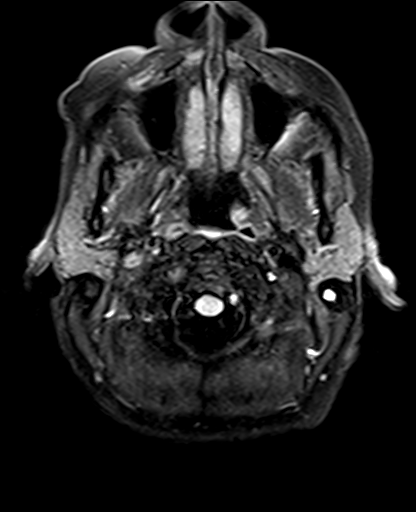
[im 25/25]
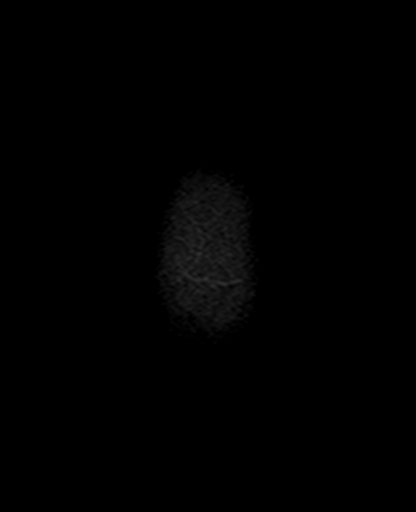

[Series 15: pha_images · axial · 3.0mm · 0.90mm/px · z∈[-86,+62]mm · 5 of 54 slices shown]
[im 1/54]
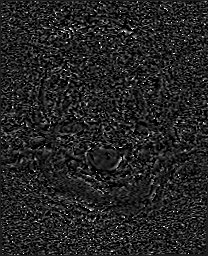
[im 14/54]
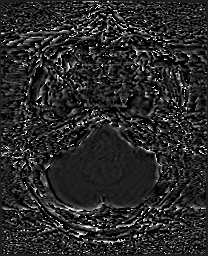
[im 27/54]
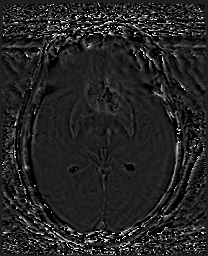
[im 40/54]
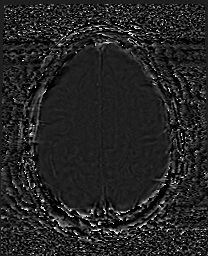
[im 54/54]
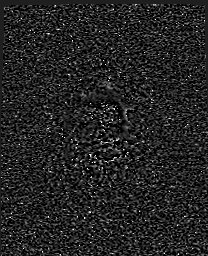

[Series 16: swi_images · axial · 3.0mm · 0.90mm/px · z∈[-88,+76]mm · 5 of 60 slices shown]
[im 1/60]
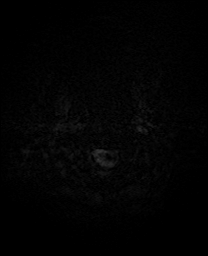
[im 15/60]
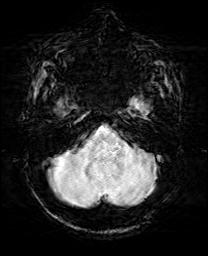
[im 30/60]
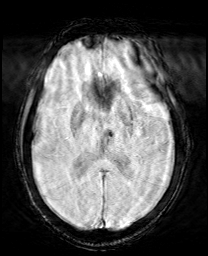
[im 45/60]
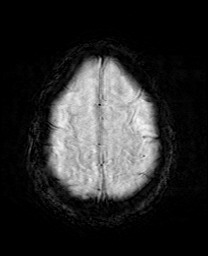
[im 60/60]
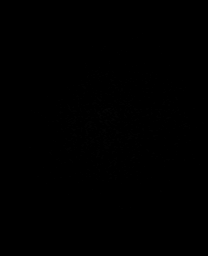

[38 of 48 positions shown; findings below may reference images not displayed]

FINDINGS: Brain: Multiple bilateral acute infarcts, the largest of which is
located in the left frontal operculum. The next largest area is in
the left occipital lobe. The other areas are punctate. Multiple
vascular territories are affected. No acute or chronic hemorrhage.
There is multifocal hyperintense T2-weighted signal within the white
matter. Generalized volume loss without a clear lobar predilection.
The midline structures are normal.

Vascular: Major flow voids are preserved.

Skull and upper cervical spine: Normal calvarium and skull base.
Visualized upper cervical spine and soft tissues are normal.

Sinuses/Orbits:No paranasal sinus fluid levels or advanced mucosal
thickening. No mastoid or middle ear effusion. Normal orbits.
IMPRESSION: Multiple bilateral acute infarcts affecting multiple vascular
territories, the largest of which are located in the left frontal
operculum and left occipital lobe. Pattern is most consistent with
embolic disease.

## 2022-10-29 IMAGING — US US RENAL
1 series · 14 of 25 positions shown · non-contrast
Comparison: Ultrasound-guided liver biopsy dated 02/06/2021. Chest,
abdomen and pelvis CT dated 05/13/2021.

CLINICAL DATA: Hematuria.  Prostate and esophageal carcinoma.

EXAM:
RENAL / URINARY TRACT ULTRASOUND COMPLETE

[Series 1: us renal · 14 of 86 slices shown]
[im 1/86]
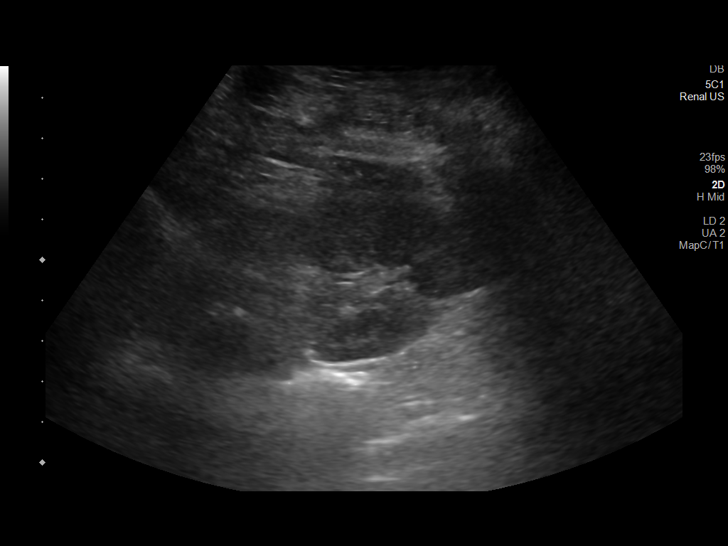
[im 8/86]
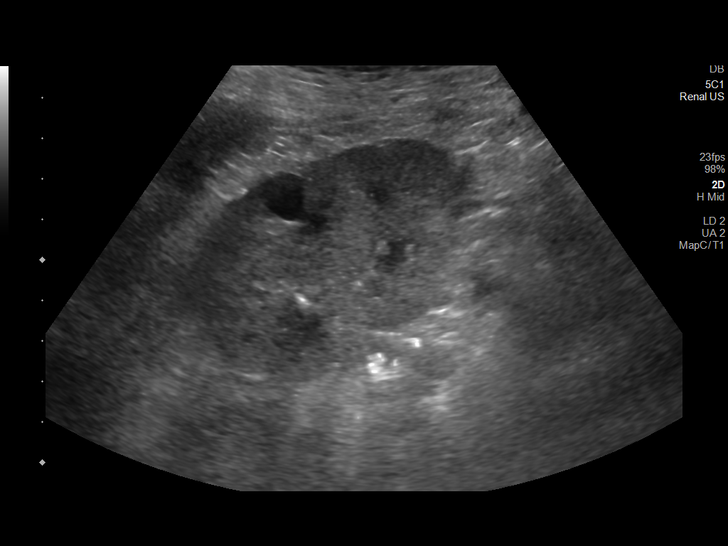
[im 15/86]
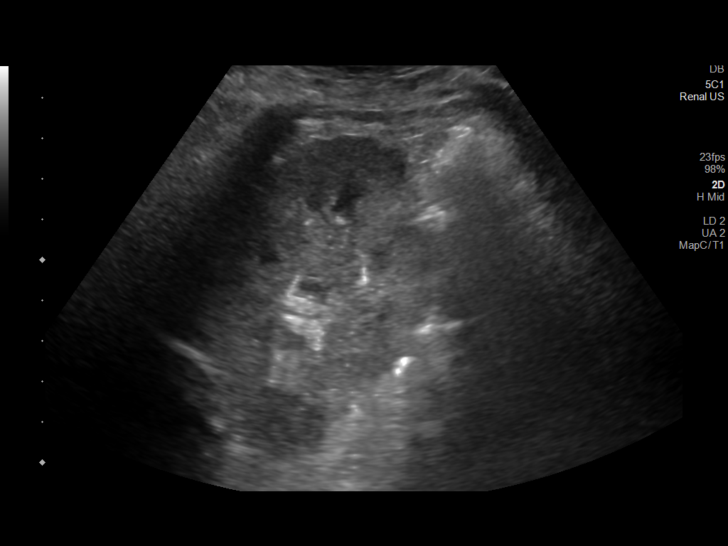
[im 22/86]
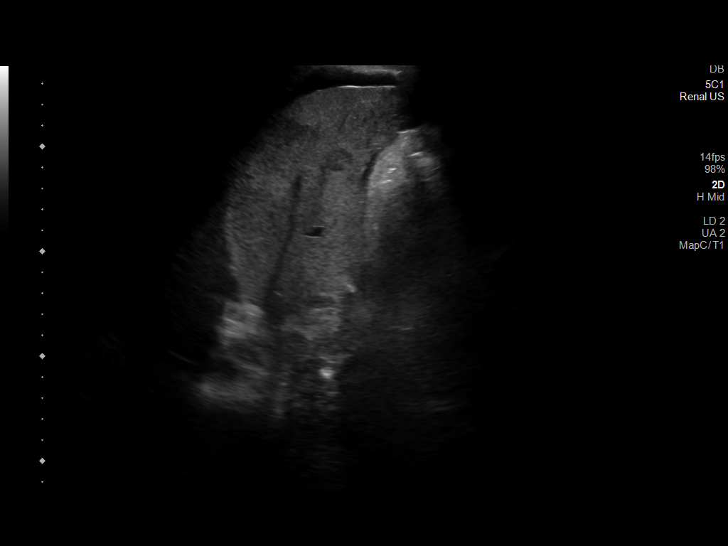
[im 29/86]
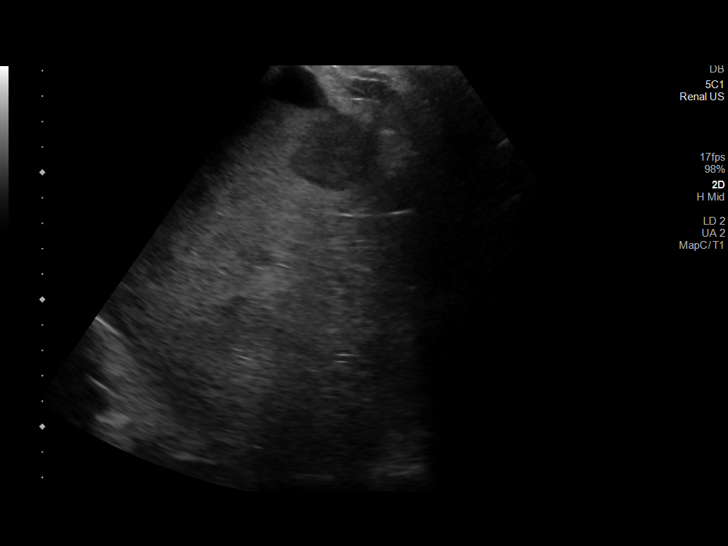
[im 32/86]
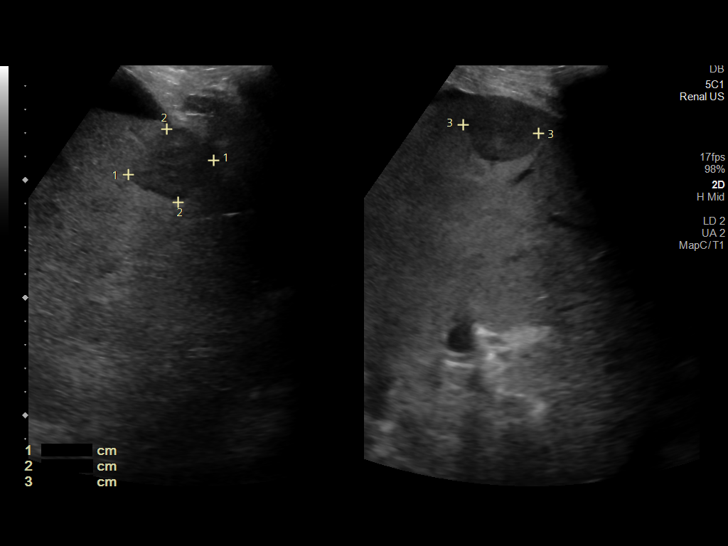
[im 39/86]
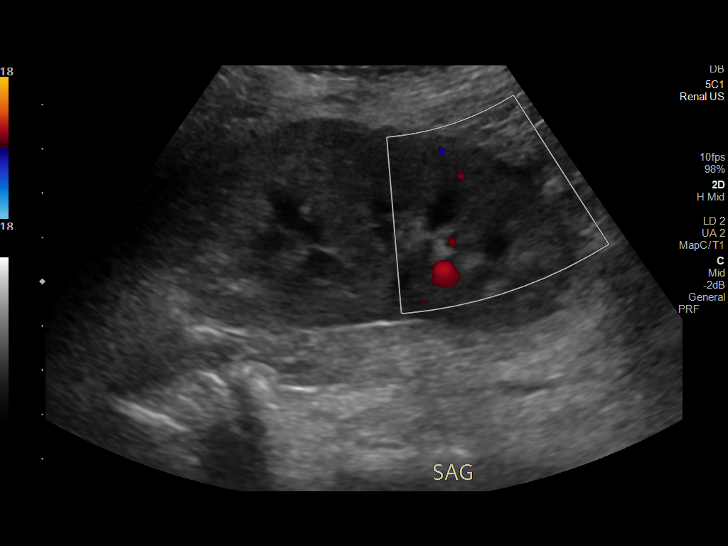
[im 47/86]
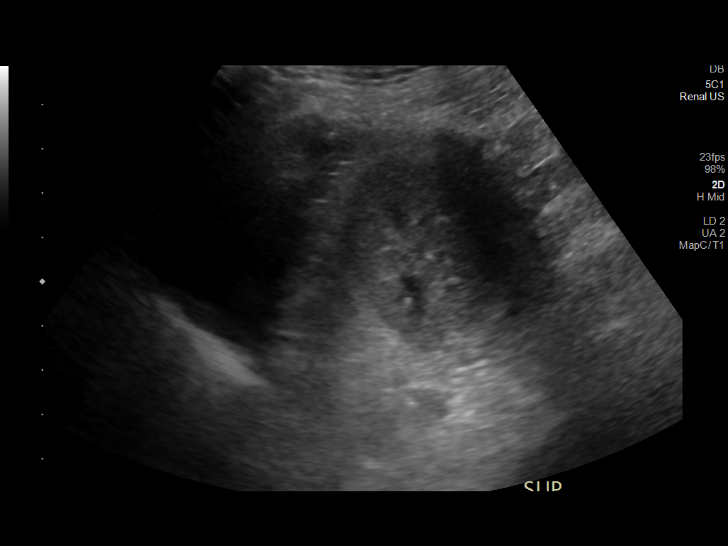
[im 54/86]
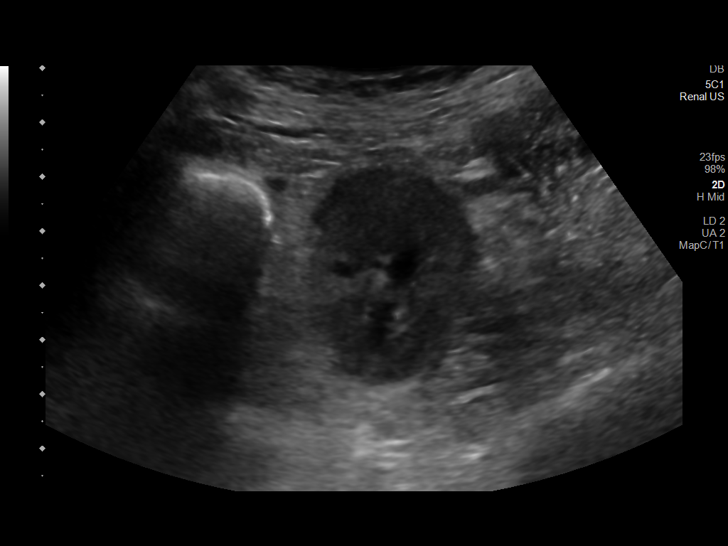
[im 57/86]
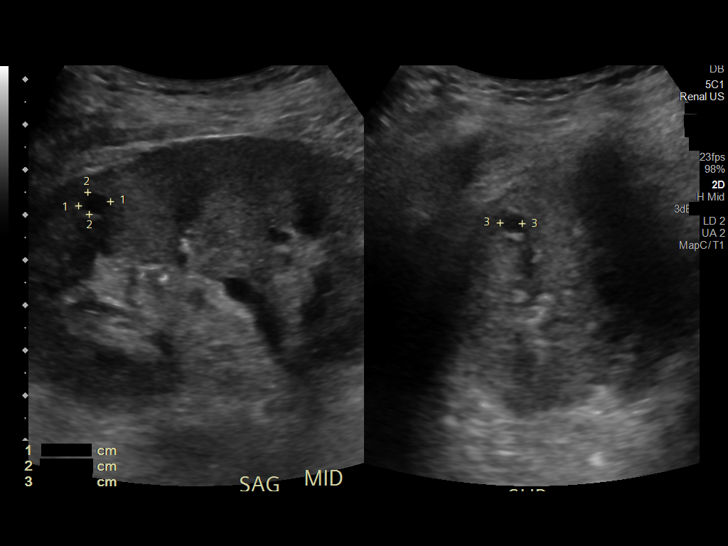
[im 64/86]
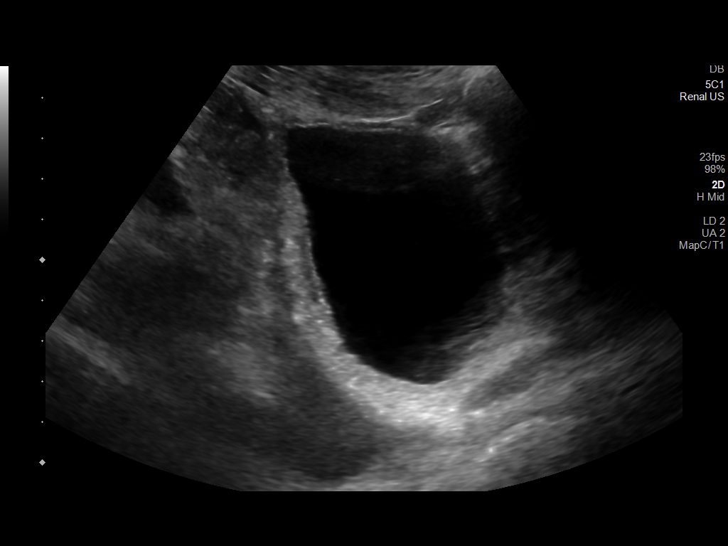
[im 71/86]
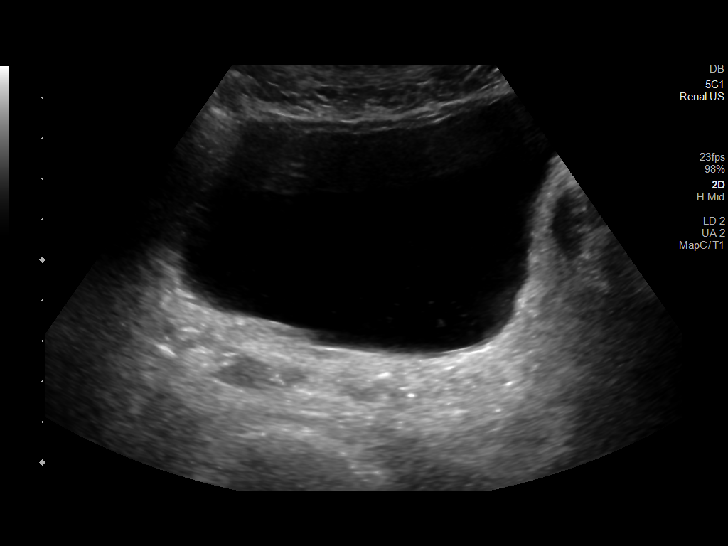
[im 78/86]
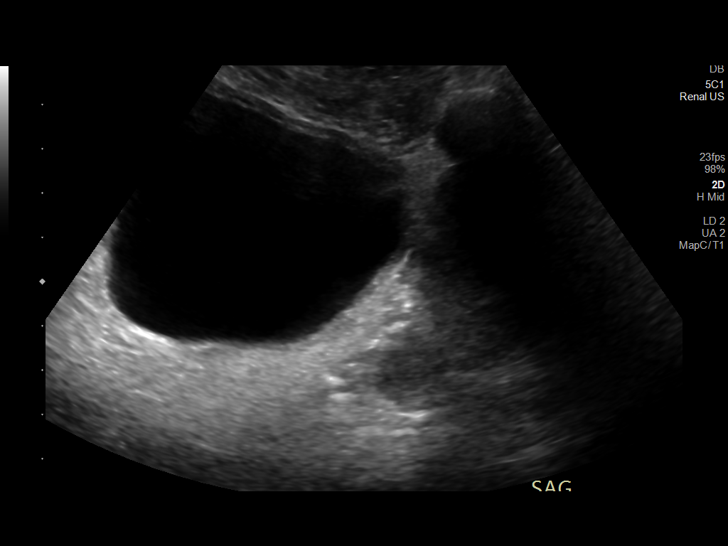
[im 86/86]
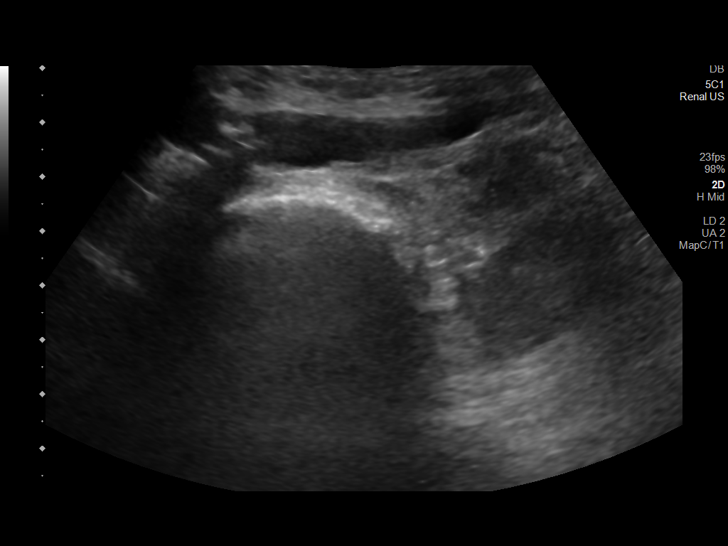

[14 of 25 positions shown; findings below may reference images not displayed]

FINDINGS: Right Kidney:

Renal measurements: 9.7 x 6.3 x 5.3 cm. = volume: 168 mL. 1.5 cm
cyst. Normal echotexture. No hydronephrosis.

Left Kidney:

Renal measurements: 10.0 x 5.5 x 4.8 cm = volume: 136 mL. 0.7 and
0.6 cm cysts. Normal echotexture. No hydronephrosis.

Bladder:

Appears normal for degree of bladder distention.

Other:

Previously demonstrated prostate radiation seed implants.

Multiple liver masses demonstrated on the CT dated 05/13/2021.

Small amount of free peritoneal fluid in all 4 quadrants of the
abdomen.
IMPRESSION: 1. Unremarkable urinary tract without hydronephrosis or visible
calculi.
2. Previously demonstrated multiple liver metastases.
3. Small amount of ascites.

## 2022-11-04 IMAGING — DX DG ABD PORTABLE 1V
1 series · 2 of 2 positions shown · non-contrast
Comparison: None.

CLINICAL DATA: Abdominal pain

EXAM:
PORTABLE ABDOMEN - 1 VIEW

[Series 1: abdomen · 0.14mm/px · 2 of 2 slices shown]
[im 1/2]
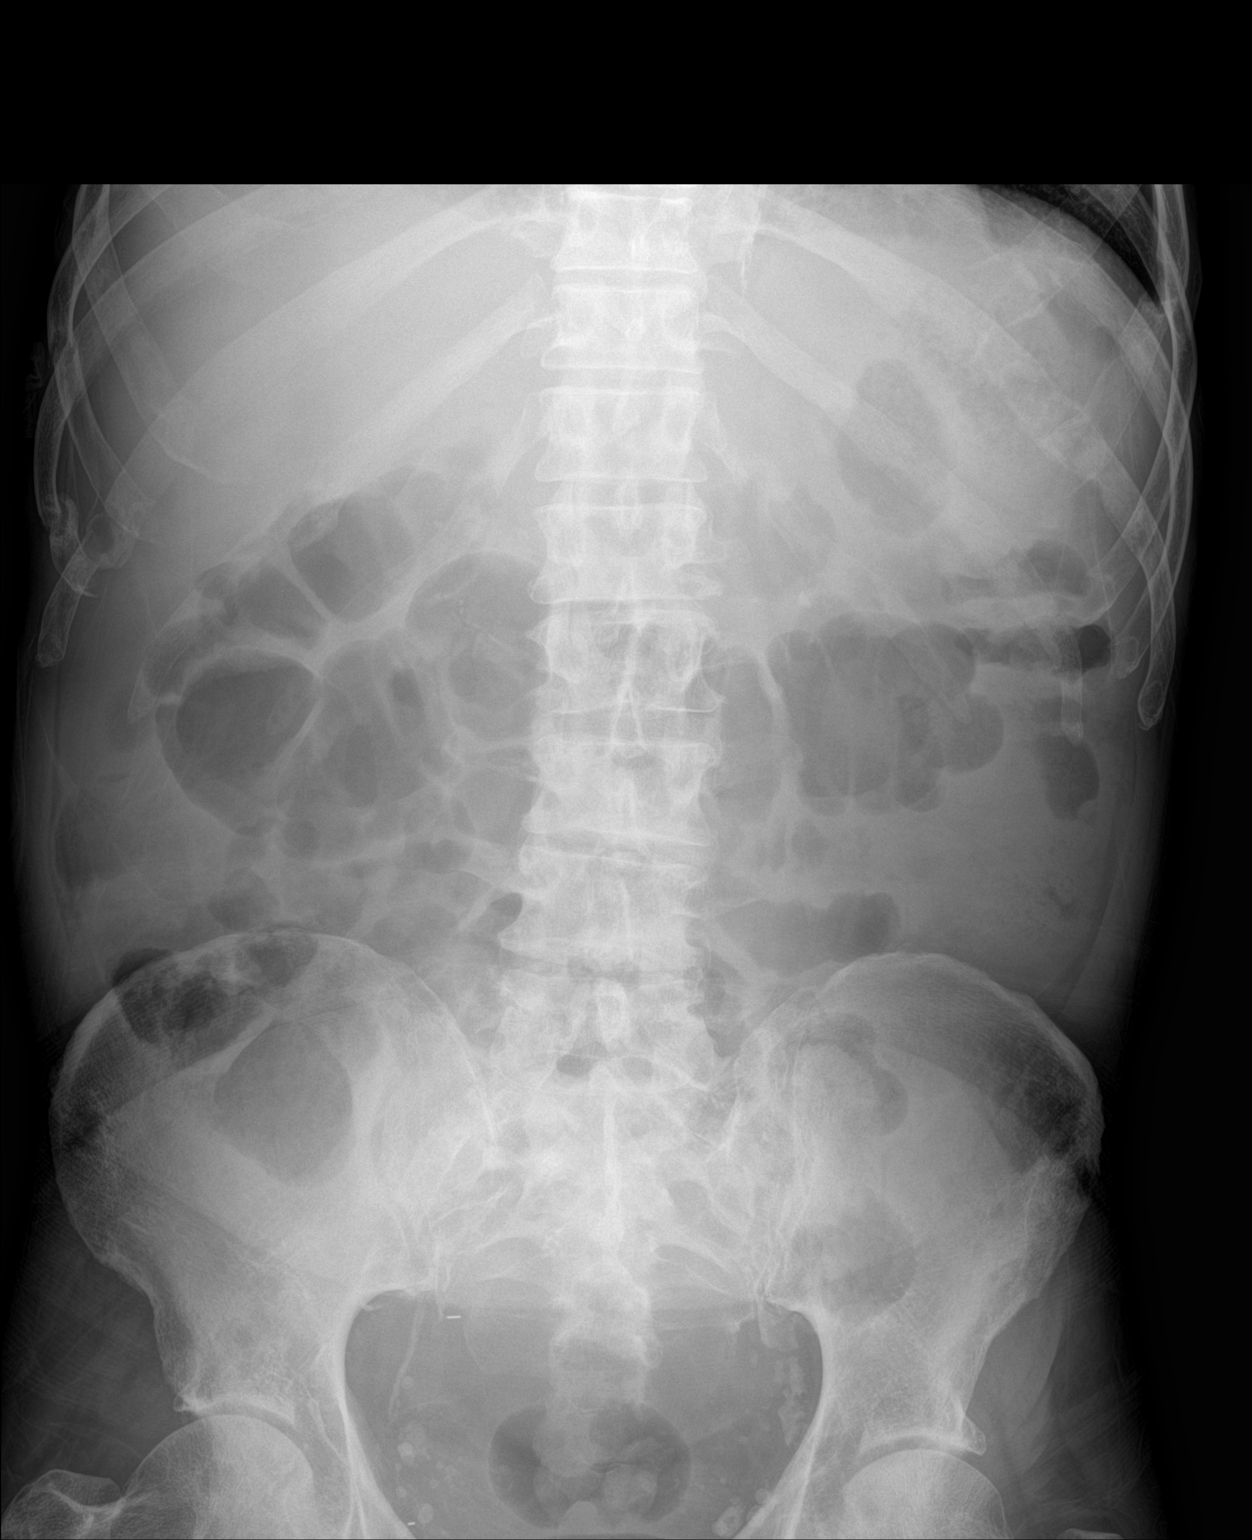
[im 2/2]
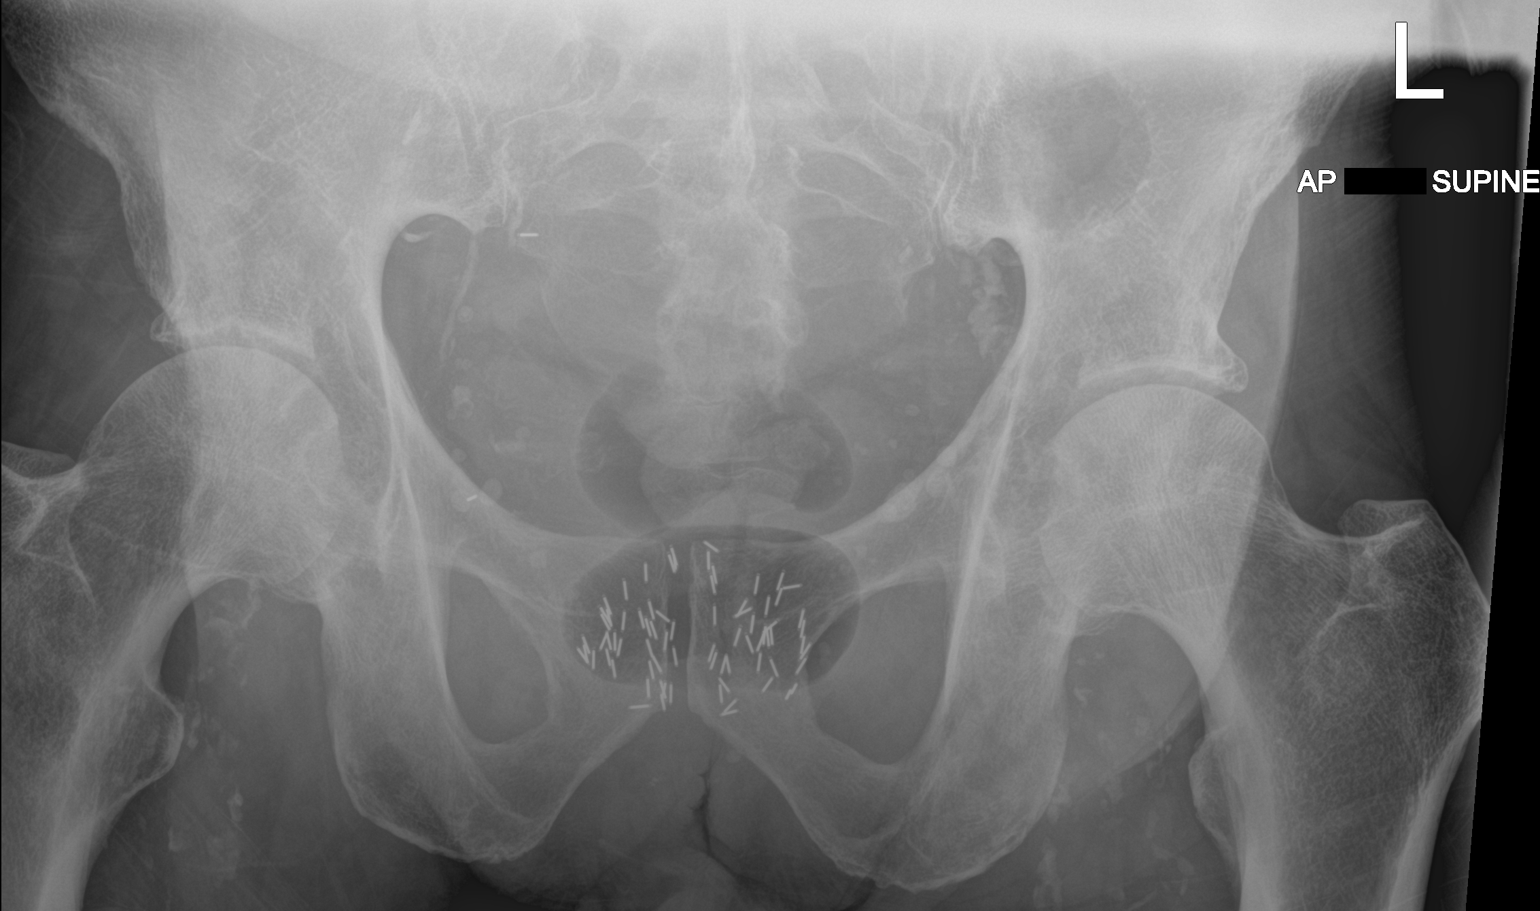

[2 of 2 positions shown; findings below may reference images not displayed]

FINDINGS: Nonobstructive bowel gas pattern. There is a small calcification
measuring 4 mm overlying the left renal shadow and tiny
calcifications overlying the right renal shadow. Vascular
calcifications. Prostate brachytherapy seeds. There are multiple
lytic lesions seen within the spine, ribs, pelvis, and proximal
femurs consistent with known diffuse lytic osseous metastatic
disease. Subacute pathologic fractures of multiple lower ribs.
IMPRESSION: No evidence of bowel obstruction.

Diffuse lytic osseous metastatic disease with multiple subacute
pathologic lower rib fractures bilaterally.

Small calcifications overlying the kidneys bilaterally, consistent
with small renal stones.
